# Patient Record
Sex: Female | Born: 1960
Health system: Southern US, Community
[De-identification: ages and names within clinical notes are randomized; demographics above are authoritative.]

## PROBLEM LIST (undated history)

## (undated) DIAGNOSIS — R51 Headache: Secondary | ICD-10-CM

## (undated) DIAGNOSIS — I10 Essential (primary) hypertension: Secondary | ICD-10-CM

## (undated) DIAGNOSIS — R519 Headache, unspecified: Secondary | ICD-10-CM

## (undated) DIAGNOSIS — M1990 Unspecified osteoarthritis, unspecified site: Secondary | ICD-10-CM

## (undated) DIAGNOSIS — M5030 Other cervical disc degeneration, unspecified cervical region: Secondary | ICD-10-CM

## (undated) DIAGNOSIS — K219 Gastro-esophageal reflux disease without esophagitis: Secondary | ICD-10-CM

## (undated) DIAGNOSIS — D649 Anemia, unspecified: Secondary | ICD-10-CM

## (undated) DIAGNOSIS — R161 Splenomegaly, not elsewhere classified: Secondary | ICD-10-CM

## (undated) DIAGNOSIS — K921 Melena: Secondary | ICD-10-CM

## (undated) DIAGNOSIS — H409 Unspecified glaucoma: Secondary | ICD-10-CM

## (undated) DIAGNOSIS — R634 Abnormal weight loss: Secondary | ICD-10-CM

## (undated) DIAGNOSIS — G4730 Sleep apnea, unspecified: Secondary | ICD-10-CM

## (undated) DIAGNOSIS — D689 Coagulation defect, unspecified: Secondary | ICD-10-CM

## (undated) DIAGNOSIS — M7989 Other specified soft tissue disorders: Secondary | ICD-10-CM

## (undated) DIAGNOSIS — T7840XA Allergy, unspecified, initial encounter: Secondary | ICD-10-CM

## (undated) DIAGNOSIS — R569 Unspecified convulsions: Secondary | ICD-10-CM

## (undated) DIAGNOSIS — F419 Anxiety disorder, unspecified: Secondary | ICD-10-CM

## (undated) DIAGNOSIS — R233 Spontaneous ecchymoses: Secondary | ICD-10-CM

## (undated) DIAGNOSIS — G709 Myoneural disorder, unspecified: Secondary | ICD-10-CM

## (undated) DIAGNOSIS — R112 Nausea with vomiting, unspecified: Secondary | ICD-10-CM

## (undated) DIAGNOSIS — K649 Unspecified hemorrhoids: Secondary | ICD-10-CM

## (undated) DIAGNOSIS — E785 Hyperlipidemia, unspecified: Secondary | ICD-10-CM

## (undated) DIAGNOSIS — D693 Immune thrombocytopenic purpura: Secondary | ICD-10-CM

## (undated) DIAGNOSIS — K769 Liver disease, unspecified: Secondary | ICD-10-CM

## (undated) DIAGNOSIS — F410 Panic disorder [episodic paroxysmal anxiety] without agoraphobia: Secondary | ICD-10-CM

## (undated) DIAGNOSIS — I517 Cardiomegaly: Secondary | ICD-10-CM

## (undated) DIAGNOSIS — N6019 Diffuse cystic mastopathy of unspecified breast: Secondary | ICD-10-CM

## (undated) DIAGNOSIS — R1310 Dysphagia, unspecified: Secondary | ICD-10-CM

## (undated) DIAGNOSIS — R531 Weakness: Secondary | ICD-10-CM

## (undated) DIAGNOSIS — K631 Perforation of intestine (nontraumatic): Secondary | ICD-10-CM

## (undated) DIAGNOSIS — R0981 Nasal congestion: Secondary | ICD-10-CM

## (undated) DIAGNOSIS — K625 Hemorrhage of anus and rectum: Secondary | ICD-10-CM

## (undated) DIAGNOSIS — F329 Major depressive disorder, single episode, unspecified: Secondary | ICD-10-CM

## (undated) DIAGNOSIS — Z5189 Encounter for other specified aftercare: Secondary | ICD-10-CM

## (undated) DIAGNOSIS — R238 Other skin changes: Secondary | ICD-10-CM

## (undated) DIAGNOSIS — F32A Depression, unspecified: Secondary | ICD-10-CM

## (undated) HISTORY — PX: SPINE SURGERY: SHX786

## (undated) HISTORY — DX: Hyperlipidemia, unspecified: E78.5

## (undated) HISTORY — DX: Nausea with vomiting, unspecified: R11.2

## (undated) HISTORY — DX: Spontaneous ecchymoses: R23.3

## (undated) HISTORY — DX: Immune thrombocytopenic purpura: D69.3

## (undated) HISTORY — DX: Unspecified osteoarthritis, unspecified site: M19.90

## (undated) HISTORY — DX: Hemorrhage of anus and rectum: K62.5

## (undated) HISTORY — DX: Unspecified glaucoma: H40.9

## (undated) HISTORY — DX: Other specified soft tissue disorders: M79.89

## (undated) HISTORY — DX: Encounter for other specified aftercare: Z51.89

## (undated) HISTORY — DX: Gastro-esophageal reflux disease without esophagitis: K21.9

## (undated) HISTORY — DX: Headache, unspecified: R51.9

## (undated) HISTORY — DX: Allergy, unspecified, initial encounter: T78.40XA

## (undated) HISTORY — DX: Other skin changes: R23.8

## (undated) HISTORY — PX: COLON SURGERY: SHX602

## (undated) HISTORY — DX: Headache: R51

## (undated) HISTORY — DX: Anemia, unspecified: D64.9

## (undated) HISTORY — DX: Liver disease, unspecified: K76.9

## (undated) HISTORY — DX: Weakness: R53.1

## (undated) HISTORY — DX: Dysphagia, unspecified: R13.10

## (undated) HISTORY — DX: Coagulation defect, unspecified: D68.9

## (undated) HISTORY — DX: Melena: K92.1

## (undated) HISTORY — DX: Essential (primary) hypertension: I10

## (undated) HISTORY — DX: Myoneural disorder, unspecified: G70.9

## (undated) HISTORY — DX: Abnormal weight loss: R63.4

## (undated) HISTORY — PX: BACK SURGERY: SHX140

## (undated) HISTORY — PX: EYE SURGERY: SHX253

## (undated) HISTORY — DX: Diffuse cystic mastopathy of unspecified breast: N60.19

## (undated) HISTORY — DX: Cardiomegaly: I51.7

## (undated) HISTORY — PX: HERNIA REPAIR: SHX51

## (undated) HISTORY — PX: CERVICAL SPINE SURGERY: SHX589

## (undated) HISTORY — DX: Nasal congestion: R09.81

## (undated) HISTORY — DX: Anxiety disorder, unspecified: F41.9

---

## 1995-11-10 HISTORY — PX: ABDOMINAL HYSTERECTOMY: SHX81

## 1996-11-09 DIAGNOSIS — R569 Unspecified convulsions: Secondary | ICD-10-CM

## 1996-11-09 HISTORY — DX: Unspecified convulsions: R56.9

## 1998-06-25 ENCOUNTER — Other Ambulatory Visit: Admission: RE | Admit: 1998-06-25 | Discharge: 1998-06-25 | Payer: Self-pay | Admitting: Obstetrics & Gynecology

## 1998-06-25 ENCOUNTER — Other Ambulatory Visit: Admission: RE | Admit: 1998-06-25 | Discharge: 1998-06-25 | Payer: Self-pay | Admitting: Cardiovascular Disease

## 1998-11-29 ENCOUNTER — Ambulatory Visit: Admission: RE | Admit: 1998-11-29 | Discharge: 1998-11-29 | Payer: Self-pay | Admitting: Pulmonary Disease

## 1999-07-02 ENCOUNTER — Other Ambulatory Visit: Admission: RE | Admit: 1999-07-02 | Discharge: 1999-07-02 | Payer: Self-pay | Admitting: Obstetrics & Gynecology

## 1999-07-09 ENCOUNTER — Ambulatory Visit (HOSPITAL_COMMUNITY): Admission: RE | Admit: 1999-07-09 | Discharge: 1999-07-09 | Payer: Self-pay | Admitting: Obstetrics & Gynecology

## 1999-07-09 ENCOUNTER — Encounter (INDEPENDENT_AMBULATORY_CARE_PROVIDER_SITE_OTHER): Payer: Self-pay

## 1999-09-12 ENCOUNTER — Encounter: Payer: Self-pay | Admitting: Obstetrics & Gynecology

## 1999-09-17 ENCOUNTER — Inpatient Hospital Stay (HOSPITAL_COMMUNITY): Admission: RE | Admit: 1999-09-17 | Discharge: 1999-09-19 | Payer: Self-pay | Admitting: Obstetrics & Gynecology

## 1999-09-17 ENCOUNTER — Encounter (INDEPENDENT_AMBULATORY_CARE_PROVIDER_SITE_OTHER): Payer: Self-pay | Admitting: Specialist

## 1999-09-22 ENCOUNTER — Inpatient Hospital Stay (HOSPITAL_COMMUNITY): Admission: AD | Admit: 1999-09-22 | Discharge: 1999-09-26 | Payer: Self-pay | Admitting: Family Medicine

## 1999-09-24 ENCOUNTER — Encounter: Payer: Self-pay | Admitting: Obstetrics & Gynecology

## 1999-11-05 ENCOUNTER — Encounter: Payer: Self-pay | Admitting: Nephrology

## 1999-11-05 ENCOUNTER — Encounter: Admission: RE | Admit: 1999-11-05 | Discharge: 1999-11-05 | Payer: Self-pay | Admitting: Critical Care Medicine

## 2001-11-25 ENCOUNTER — Encounter: Admission: RE | Admit: 2001-11-25 | Discharge: 2001-11-25 | Payer: Self-pay | Admitting: *Deleted

## 2002-02-05 ENCOUNTER — Emergency Department (HOSPITAL_COMMUNITY): Admission: EM | Admit: 2002-02-05 | Discharge: 2002-02-05 | Payer: Self-pay

## 2002-02-07 ENCOUNTER — Encounter: Admission: RE | Admit: 2002-02-07 | Discharge: 2002-02-07 | Payer: Self-pay | Admitting: Internal Medicine

## 2002-03-02 ENCOUNTER — Encounter: Payer: Self-pay | Admitting: Internal Medicine

## 2002-03-02 ENCOUNTER — Encounter: Admission: RE | Admit: 2002-03-02 | Discharge: 2002-03-02 | Payer: Self-pay | Admitting: Internal Medicine

## 2002-03-02 ENCOUNTER — Ambulatory Visit (HOSPITAL_COMMUNITY): Admission: RE | Admit: 2002-03-02 | Discharge: 2002-03-02 | Payer: Self-pay | Admitting: Internal Medicine

## 2002-03-20 ENCOUNTER — Encounter: Admission: RE | Admit: 2002-03-20 | Discharge: 2002-03-20 | Payer: Self-pay | Admitting: Internal Medicine

## 2002-06-09 ENCOUNTER — Encounter: Admission: RE | Admit: 2002-06-09 | Discharge: 2002-06-09 | Payer: Self-pay | Admitting: Internal Medicine

## 2002-08-07 ENCOUNTER — Emergency Department (HOSPITAL_COMMUNITY): Admission: EM | Admit: 2002-08-07 | Discharge: 2002-08-08 | Payer: Self-pay | Admitting: Emergency Medicine

## 2002-08-08 ENCOUNTER — Encounter: Payer: Self-pay | Admitting: Emergency Medicine

## 2002-11-23 ENCOUNTER — Encounter: Admission: RE | Admit: 2002-11-23 | Discharge: 2002-11-23 | Payer: Self-pay | Admitting: Internal Medicine

## 2003-03-27 ENCOUNTER — Encounter: Admission: RE | Admit: 2003-03-27 | Discharge: 2003-03-27 | Payer: Self-pay | Admitting: Internal Medicine

## 2003-06-26 ENCOUNTER — Encounter: Admission: RE | Admit: 2003-06-26 | Discharge: 2003-06-26 | Payer: Self-pay | Admitting: Internal Medicine

## 2003-07-13 ENCOUNTER — Ambulatory Visit (HOSPITAL_COMMUNITY): Admission: RE | Admit: 2003-07-13 | Discharge: 2003-07-13 | Payer: Self-pay | Admitting: Family Medicine

## 2003-10-09 ENCOUNTER — Encounter: Admission: RE | Admit: 2003-10-09 | Discharge: 2003-10-09 | Payer: Self-pay | Admitting: Internal Medicine

## 2004-01-09 ENCOUNTER — Encounter: Admission: RE | Admit: 2004-01-09 | Discharge: 2004-01-09 | Payer: Self-pay | Admitting: Internal Medicine

## 2004-03-12 ENCOUNTER — Emergency Department (HOSPITAL_COMMUNITY): Admission: EM | Admit: 2004-03-12 | Discharge: 2004-03-12 | Payer: Self-pay | Admitting: Family Medicine

## 2004-05-07 ENCOUNTER — Inpatient Hospital Stay (HOSPITAL_COMMUNITY): Admission: RE | Admit: 2004-05-07 | Discharge: 2004-05-08 | Payer: Self-pay | Admitting: Orthopaedic Surgery

## 2005-01-19 ENCOUNTER — Ambulatory Visit: Payer: Self-pay | Admitting: Internal Medicine

## 2005-02-19 ENCOUNTER — Ambulatory Visit: Payer: Self-pay | Admitting: Internal Medicine

## 2005-03-04 ENCOUNTER — Ambulatory Visit: Payer: Self-pay | Admitting: Internal Medicine

## 2005-03-04 ENCOUNTER — Ambulatory Visit (HOSPITAL_COMMUNITY): Admission: RE | Admit: 2005-03-04 | Discharge: 2005-03-04 | Payer: Self-pay | Admitting: Internal Medicine

## 2005-09-03 ENCOUNTER — Ambulatory Visit: Payer: Self-pay | Admitting: Hospitalist

## 2005-09-11 ENCOUNTER — Ambulatory Visit: Payer: Self-pay | Admitting: Internal Medicine

## 2005-12-04 ENCOUNTER — Ambulatory Visit (HOSPITAL_COMMUNITY): Admission: RE | Admit: 2005-12-04 | Discharge: 2005-12-04 | Payer: Self-pay | Admitting: Orthopaedic Surgery

## 2006-02-04 ENCOUNTER — Ambulatory Visit (HOSPITAL_COMMUNITY): Admission: RE | Admit: 2006-02-04 | Discharge: 2006-02-06 | Payer: Self-pay | Admitting: Orthopaedic Surgery

## 2006-02-18 ENCOUNTER — Ambulatory Visit: Payer: Self-pay | Admitting: Hospitalist

## 2006-10-07 ENCOUNTER — Ambulatory Visit: Payer: Self-pay | Admitting: Hospitalist

## 2006-10-12 ENCOUNTER — Ambulatory Visit (HOSPITAL_COMMUNITY): Admission: RE | Admit: 2006-10-12 | Discharge: 2006-10-12 | Payer: Self-pay | Admitting: Sports Medicine

## 2006-12-22 ENCOUNTER — Telehealth (INDEPENDENT_AMBULATORY_CARE_PROVIDER_SITE_OTHER): Payer: Self-pay | Admitting: *Deleted

## 2007-01-24 ENCOUNTER — Telehealth: Payer: Self-pay | Admitting: *Deleted

## 2007-01-25 ENCOUNTER — Ambulatory Visit: Payer: Self-pay | Admitting: Hospitalist

## 2007-01-25 ENCOUNTER — Ambulatory Visit (HOSPITAL_COMMUNITY): Admission: RE | Admit: 2007-01-25 | Discharge: 2007-01-25 | Payer: Self-pay | Admitting: Hospitalist

## 2007-01-25 DIAGNOSIS — F17200 Nicotine dependence, unspecified, uncomplicated: Secondary | ICD-10-CM

## 2007-01-25 DIAGNOSIS — F329 Major depressive disorder, single episode, unspecified: Secondary | ICD-10-CM

## 2007-01-25 DIAGNOSIS — R51 Headache: Secondary | ICD-10-CM | POA: Insufficient documentation

## 2007-01-25 DIAGNOSIS — F5104 Psychophysiologic insomnia: Secondary | ICD-10-CM | POA: Insufficient documentation

## 2007-01-25 DIAGNOSIS — R609 Edema, unspecified: Secondary | ICD-10-CM | POA: Insufficient documentation

## 2007-01-25 DIAGNOSIS — M5030 Other cervical disc degeneration, unspecified cervical region: Secondary | ICD-10-CM

## 2007-01-25 DIAGNOSIS — M545 Low back pain, unspecified: Secondary | ICD-10-CM | POA: Insufficient documentation

## 2007-01-25 DIAGNOSIS — R519 Headache, unspecified: Secondary | ICD-10-CM | POA: Insufficient documentation

## 2007-01-25 DIAGNOSIS — N63 Unspecified lump in unspecified breast: Secondary | ICD-10-CM | POA: Insufficient documentation

## 2007-01-25 DIAGNOSIS — G4700 Insomnia, unspecified: Secondary | ICD-10-CM

## 2007-01-25 DIAGNOSIS — Z9079 Acquired absence of other genital organ(s): Secondary | ICD-10-CM | POA: Insufficient documentation

## 2007-01-25 DIAGNOSIS — I10 Essential (primary) hypertension: Secondary | ICD-10-CM | POA: Insufficient documentation

## 2007-01-25 DIAGNOSIS — R569 Unspecified convulsions: Secondary | ICD-10-CM

## 2007-01-25 DIAGNOSIS — G40909 Epilepsy, unspecified, not intractable, without status epilepticus: Secondary | ICD-10-CM | POA: Insufficient documentation

## 2007-01-25 DIAGNOSIS — F411 Generalized anxiety disorder: Secondary | ICD-10-CM | POA: Insufficient documentation

## 2007-01-25 HISTORY — DX: Edema, unspecified: R60.9

## 2007-01-25 LAB — CONVERTED CEMR LAB
AST: 16 units/L (ref 0–37)
Albumin: 4.4 g/dL (ref 3.5–5.2)
Alkaline Phosphatase: 99 units/L (ref 39–117)
BUN: 25 mg/dL — ABNORMAL HIGH (ref 6–23)
CO2: 27 meq/L (ref 19–32)
Calcium: 9.8 mg/dL (ref 8.4–10.5)
Cholesterol: 199 mg/dL (ref 0–200)
Glucose, Bld: 109 mg/dL — ABNORMAL HIGH (ref 70–99)
HDL: 48 mg/dL (ref >39)
LDL Cholesterol: 119 mg/dL — ABNORMAL HIGH (ref 0–99)
Potassium: 4.3 meq/L (ref 3.5–5.3)
Total Bilirubin: 0.3 mg/dL (ref 0.3–1.2)
Total CHOL/HDL Ratio: 4.1
Triglycerides: 161 mg/dL — ABNORMAL HIGH (ref <150)

## 2007-05-12 ENCOUNTER — Telehealth (INDEPENDENT_AMBULATORY_CARE_PROVIDER_SITE_OTHER): Payer: Self-pay | Admitting: *Deleted

## 2007-05-26 ENCOUNTER — Telehealth: Payer: Self-pay | Admitting: *Deleted

## 2007-07-01 ENCOUNTER — Telehealth (INDEPENDENT_AMBULATORY_CARE_PROVIDER_SITE_OTHER): Payer: Self-pay | Admitting: Hospitalist

## 2007-07-25 ENCOUNTER — Telehealth: Payer: Self-pay | Admitting: *Deleted

## 2007-10-20 ENCOUNTER — Telehealth: Payer: Self-pay | Admitting: *Deleted

## 2007-10-24 ENCOUNTER — Ambulatory Visit: Payer: Self-pay | Admitting: Internal Medicine

## 2007-10-24 ENCOUNTER — Encounter (INDEPENDENT_AMBULATORY_CARE_PROVIDER_SITE_OTHER): Payer: Self-pay | Admitting: Infectious Diseases

## 2007-10-24 DIAGNOSIS — J019 Acute sinusitis, unspecified: Secondary | ICD-10-CM

## 2007-10-24 LAB — CONVERTED CEMR LAB
Albumin: 4.2 g/dL (ref 3.5–5.2)
CO2: 29 meq/L (ref 19–32)
Calcium: 9.7 mg/dL (ref 8.4–10.5)
Chloride: 104 meq/L (ref 96–112)
Creatinine, Ser: 0.78 mg/dL (ref 0.40–1.20)
Glucose, Bld: 91 mg/dL (ref 70–99)
Sodium: 142 meq/L (ref 135–145)
Total Bilirubin: 0.3 mg/dL (ref 0.3–1.2)

## 2007-11-22 ENCOUNTER — Telehealth: Payer: Self-pay | Admitting: *Deleted

## 2007-11-23 ENCOUNTER — Telehealth (INDEPENDENT_AMBULATORY_CARE_PROVIDER_SITE_OTHER): Payer: Self-pay | Admitting: Hospitalist

## 2007-12-20 ENCOUNTER — Ambulatory Visit: Payer: Self-pay | Admitting: Hospitalist

## 2007-12-20 DIAGNOSIS — R55 Syncope and collapse: Secondary | ICD-10-CM

## 2007-12-20 HISTORY — DX: Syncope and collapse: R55

## 2007-12-20 LAB — CONVERTED CEMR LAB
ALT: 11 units/L (ref 0–35)
AST: 15 units/L (ref 0–37)
Albumin: 4.6 g/dL (ref 3.5–5.2)
Alkaline Phosphatase: 96 units/L (ref 39–117)
Anti Nuclear Antibody(ANA): NEGATIVE
BUN: 18 mg/dL (ref 6–23)
Basophils Absolute: 0 10*3/uL (ref 0.0–0.1)
Basophils Relative: 0 % (ref 0–1)
CO2: 28 meq/L (ref 19–32)
Calcium: 10.1 mg/dL (ref 8.4–10.5)
Chloride: 99 meq/L (ref 96–112)
Creatinine, Ser: 0.87 mg/dL (ref 0.40–1.20)
Eosinophils Absolute: 0.6 10*3/uL (ref 0.0–0.7)
Eosinophils Relative: 6 % — ABNORMAL HIGH (ref 0–5)
Glucose, Bld: 101 mg/dL — ABNORMAL HIGH (ref 70–99)
HCT: 46.5 % — ABNORMAL HIGH (ref 36.0–46.0)
Hemoglobin: 15.5 g/dL — ABNORMAL HIGH (ref 12.0–15.0)
Lymphocytes Relative: 41 % (ref 12–46)
Lymphs Abs: 3.8 10*3/uL (ref 0.7–4.0)
MCHC: 33.3 g/dL (ref 30.0–36.0)
MCV: 92.3 fL (ref 78.0–100.0)
Monocytes Absolute: 0.8 10*3/uL (ref 0.1–1.0)
Monocytes Relative: 8 % (ref 3–12)
Neutro Abs: 4 10*3/uL (ref 1.7–7.7)
Neutrophils Relative %: 44 % (ref 43–77)
Platelets: 338 10*3/uL (ref 150–400)
Potassium: 4.1 meq/L (ref 3.5–5.3)
RBC: 5.04 M/uL (ref 3.87–5.11)
RDW: 12.7 % (ref 11.5–15.5)
Sed Rate: 6 mm/hr (ref 0–22)
Sodium: 141 meq/L (ref 135–145)
TSH: 0.557 microintl units/mL (ref 0.350–5.50)
Total Bilirubin: 0.4 mg/dL (ref 0.3–1.2)
Total Protein: 7.5 g/dL (ref 6.0–8.3)
Vitamin B-12: 386 pg/mL (ref 211–911)
WBC: 9.1 10*3/uL (ref 4.0–10.5)

## 2007-12-27 ENCOUNTER — Ambulatory Visit (HOSPITAL_COMMUNITY): Admission: RE | Admit: 2007-12-27 | Discharge: 2007-12-27 | Payer: Self-pay | Admitting: Hospitalist

## 2007-12-29 ENCOUNTER — Telehealth (INDEPENDENT_AMBULATORY_CARE_PROVIDER_SITE_OTHER): Payer: Self-pay | Admitting: *Deleted

## 2008-01-26 ENCOUNTER — Ambulatory Visit: Payer: Self-pay | Admitting: Hospitalist

## 2008-01-26 ENCOUNTER — Telehealth (INDEPENDENT_AMBULATORY_CARE_PROVIDER_SITE_OTHER): Payer: Self-pay | Admitting: Hospitalist

## 2008-01-26 DIAGNOSIS — R05 Cough: Secondary | ICD-10-CM

## 2008-03-02 ENCOUNTER — Telehealth (INDEPENDENT_AMBULATORY_CARE_PROVIDER_SITE_OTHER): Payer: Self-pay | Admitting: Hospitalist

## 2008-03-06 ENCOUNTER — Telehealth (INDEPENDENT_AMBULATORY_CARE_PROVIDER_SITE_OTHER): Payer: Self-pay | Admitting: Pharmacy Technician

## 2008-04-09 ENCOUNTER — Telehealth (INDEPENDENT_AMBULATORY_CARE_PROVIDER_SITE_OTHER): Payer: Self-pay | Admitting: Hospitalist

## 2008-06-19 ENCOUNTER — Telehealth: Payer: Self-pay | Admitting: *Deleted

## 2008-07-03 ENCOUNTER — Telehealth: Payer: Self-pay | Admitting: *Deleted

## 2008-07-11 ENCOUNTER — Telehealth: Payer: Self-pay | Admitting: Internal Medicine

## 2008-07-18 ENCOUNTER — Telehealth: Payer: Self-pay | Admitting: *Deleted

## 2008-07-18 ENCOUNTER — Encounter: Payer: Self-pay | Admitting: Internal Medicine

## 2008-07-20 ENCOUNTER — Telehealth: Payer: Self-pay | Admitting: *Deleted

## 2008-07-25 ENCOUNTER — Ambulatory Visit: Payer: Self-pay | Admitting: Internal Medicine

## 2008-07-25 LAB — CONVERTED CEMR LAB
BUN: 15 mg/dL (ref 6–23)
Calcium: 9.9 mg/dL (ref 8.4–10.5)
Creatinine, Ser: 0.85 mg/dL (ref 0.40–1.20)
Potassium: 4.2 meq/L (ref 3.5–5.3)

## 2008-08-08 ENCOUNTER — Telehealth: Payer: Self-pay | Admitting: Internal Medicine

## 2008-08-21 ENCOUNTER — Telehealth: Payer: Self-pay | Admitting: *Deleted

## 2008-09-03 ENCOUNTER — Telehealth: Payer: Self-pay | Admitting: *Deleted

## 2008-10-09 ENCOUNTER — Telehealth: Payer: Self-pay | Admitting: *Deleted

## 2008-10-19 ENCOUNTER — Telehealth: Payer: Self-pay | Admitting: Internal Medicine

## 2008-11-13 ENCOUNTER — Telehealth: Payer: Self-pay | Admitting: *Deleted

## 2008-11-15 ENCOUNTER — Telehealth: Payer: Self-pay | Admitting: *Deleted

## 2009-08-08 ENCOUNTER — Encounter: Admission: RE | Admit: 2009-08-08 | Discharge: 2009-08-08 | Payer: Self-pay

## 2010-01-21 ENCOUNTER — Encounter: Payer: Self-pay | Admitting: Internal Medicine

## 2010-03-03 ENCOUNTER — Encounter (INDEPENDENT_AMBULATORY_CARE_PROVIDER_SITE_OTHER): Payer: Self-pay | Admitting: *Deleted

## 2010-07-01 ENCOUNTER — Encounter: Admission: RE | Admit: 2010-07-01 | Discharge: 2010-07-01 | Payer: Self-pay | Admitting: Orthopaedic Surgery

## 2010-11-30 ENCOUNTER — Encounter: Payer: Self-pay | Admitting: Internal Medicine

## 2010-12-11 NOTE — Miscellaneous (Signed)
Summary: [facility]  [facility]   Imported By: Florinda Marker 01/21/2010 14:25:18  _____________________________________________________________________  External Attachment:    Type:   Image     Comment:   External Document

## 2010-12-11 NOTE — Letter (Signed)
Summary: [facility] RECALL LETTER  All     ,     Phone:   Fax:     03/03/2010   [facility] A LUNSFORD [address] [place], [place]  [address]  [id number]   Dear  Ms. Tarshia LUNSFORD,   You are due to follow-up with a doctor at the [facility].  We have been unable to contact you by phone.  If you would like to schedule a visit, please call [phone].  If you are receiving your health care somewhere else, please call us and we will take your name off our patient list.  Healthy regards,  Raynaldo Opitz, Director [facility]

## 2011-01-06 ENCOUNTER — Other Ambulatory Visit: Payer: Self-pay | Admitting: Orthopaedic Surgery

## 2011-01-06 DIAGNOSIS — M542 Cervicalgia: Secondary | ICD-10-CM

## 2011-01-08 ENCOUNTER — Other Ambulatory Visit: Payer: Self-pay

## 2011-01-23 ENCOUNTER — Other Ambulatory Visit: Payer: Self-pay

## 2011-01-23 ENCOUNTER — Inpatient Hospital Stay (HOSPITAL_COMMUNITY): Admission: RE | Admit: 2011-01-23 | Payer: Self-pay | Source: Ambulatory Visit | Admitting: Orthopedic Surgery

## 2011-01-23 ENCOUNTER — Ambulatory Visit
Admission: RE | Admit: 2011-01-23 | Discharge: 2011-01-23 | Disposition: A | Discharge status: Home / Self Care | Payer: Medicare Other | Source: Ambulatory Visit | Attending: Orthopaedic Surgery | Admitting: Orthopaedic Surgery

## 2011-01-23 DIAGNOSIS — M542 Cervicalgia: Secondary | ICD-10-CM

## 2011-03-27 NOTE — Op Note (Signed)
NAME:  [name], SAKUMA NO.:  [id number]   MEDICAL RECORD NO.:  [id number]          PATIENT TYPE:  OIB   LOCATION:  3004                         FACILITY:  [facility]   PHYSICIAN:  Sharolyn Douglas, M.D.        DATE OF BIRTH:  17-Aug-1961   DATE OF PROCEDURE:  02/04/2006  DATE OF DISCHARGE:                                 OPERATIVE REPORT   DIAGNOSES:  1.  Cervical spondylotic radiculopathy C6-7.  2.  Previous anterior cervical decompression and fusion, C4-5 and C5-6 with      plate.  3.  Chronic neck and bilateral upper extremity pain.   PROCEDURES:  1.  Removal of anterior cervical plate from C4 to C6.  2.  Anterior cervical diskectomy C6-7 with decompression of the thecal sac      and foramen bilaterally.  3.  Anterior cervical fusion C6-7 with placement of allograft prosthesis      spacer, packed with local autogenous bone graft.  4.  The anterior cervical plating C6-8 using the Abbott spine system.   SURGEON:  Sharolyn Douglas, M.D.   ASSISTANT:  Verlin Fester, P.A.   ANESTHESIA:  General endotracheal.   ESTIMATED BLOOD LOSS:  Minimal.   COMPLICATIONS:  None.   INDICATIONS:  The patient is a 50 year old female with progressive neck and  bilateral upper extremity pain.  She has a previous history of anterior  cervical fusion from C4-C6 with plate.  She did well initially but has  redeveloped symptomatology.  She had a selective nerve root injection at C7,  and this provided temporary relief.  A MRI scan showed stenosis below the  fusion with biforaminal narrowing.  Because of her persistent symptoms, she  has elected to undergo removal of the previous plate from J4-N8, anterior  cervical fusion C6-7 with allograft and plate.  The risk and benefits were  reviewed and she elected to proceed.   PROCEDURE:  The patient was identified in the holding area, taken to the  operating room.  She underwent general endotracheal anesthesia without  difficulty; given prophylactic  IV antibiotics.  She was carefully positioned  on the operating room table with the Mayfield head rest.  Her neck was  placed in slight extension.  The neck was prepped and draped in usual  sterile fashion.  The previous left-sided transverse incision was utilized.  Dissection was carried through the previous scar tissue.  The interval  between the SCM and strap muscles medially was developed down to the  prevertebral space.  There was an extensive amount of scarring noted, and  the carotid sheath, esophagus and trachea were identified and protected at  all times.  The previous plate was identified and carefully cleared of scar  tissue.  Using the appropriate screwdriver, screws were backed out and the  plate was removed.  Bone wax was placed into the screw holes.  We then  evaluated the C6-7 disk; there was a large anterior osteophyte.  This was  removed a Gaffer.  Deep retractors were placed.  Caspar distraction  pins were placed at C6-C7 vertebral  bodies.  Gentle distraction applied.  The microscope was draped and brought into the field.  We then completed a  radical diskectomy back to a posterior longitudinal ligament.  The disk  space itself was degenerative and narrowed.  A high-speed bur was used to  take down the intervertebral joints.  A 2 mm Kerrison punch was used to  remove the posterior longitudinal ligament, undercut the posterior vertebral  margins, and complete foraminotomies.  We then placed a 7 mm allograft  prosthesis spacer, which had been packed with local bone graft from the  drill shavings.  This was carefully countersunk 1 mm into the interspace.  We then placed a 24-mm anterior cervical plate from Z6-X0, with four 12 mm  locking screws.  We ensured that the locking mechanism was engaged.  The  wound was irrigated.  The esophagus, trachea and carotid sheath were  evaluated.  There were no apparent injuries.  Hemostasis was achieved.  A  deep Penrose drain  was left in place.  Intraoperative x-ray was taken to  confirm levels, showing appropriate positioning of the instrumentation.  It  was difficult to visualize the C7 level due to the patient's body habitus.  The wound was then closed in layers using 2-0 Vicryl on the platysma, 3-0  Vicryl on the subcutaneous layer, and then 4-0 Vicryl suture to approximate  the skin edges.  Benzoin and Steri-Strips were placed.  Sterile dressing was  applied.  The patient was extubated without difficulty; transferred to the  recovery in stable condition, able to move her upper and lower extremities.  Needle and sponge count were correct.      Sharolyn Douglas, M.D.  Electronically Signed     MC/MEDQ  D:  02/04/2006  T:  02/05/2006  Job:  [id number]

## 2011-03-27 NOTE — Op Note (Signed)
[name], [name] NO.:  [id number]   MEDICAL RECORD NO.:  [id number]                   PATIENT TYPE:  INP   LOCATION:  2899                                 FACILITY:  [facility]   PHYSICIAN:  Sharolyn Douglas, M.D.                     DATE OF BIRTH:  1960-12-01   DATE OF PROCEDURE:  05/07/2004  DATE OF DISCHARGE:                                 OPERATIVE REPORT   PREOPERATIVE DIAGNOSIS:  Cervical spondylitic radiculopathy C4-5 and C5-6.   PROCEDURE:  1. Anterior cervical diskectomy C4-5 and C5-6 with decompression of the     nerve root bilaterally.  2. Anterior cervical arthrodesis C4-5 and C5-6 with placement of two     allograft prosthesis spacers packed with local autogenous bone graft.  3. Anterior cervical plating C4 through C6 utilizing the spinal concept     system.   SURGEON:  Sharolyn Douglas, M.D.   ASSISTANT:  Verlin Fester, P.A.   ANESTHESIA:  General endotracheal.   COMPLICATIONS:  None.   INDICATIONS FOR PROCEDURE:  The patient is a very pleasant 50 year old  female with persistent right upper extremity pain felt to be secondary to  foraminal narrowing and HNP at C4-5 and C5-6.  She has been refractory to  all conservative care, elected to undergo ACTF at C4-5 and C5-6 in hopes of  improving her symptoms.  Risks, benefits, and alternatives were extensively  reviewed.   DESCRIPTION OF PROCEDURE:  The patient was preoperatively identified in the  holding area and taken to the operating room.  She underwent general  endotracheal anesthesia without difficulty.  She was given prophylactic IV  antibiotics.  She was carefully positioned supine on the operating table  with the Mayfield head rest.  Her neck was placed in slight extension.  Neck  was prepped and draped in the usual sterile fashion.  5 pounds of Halter  traction applied.  4 cm incision made left side of the neck in a transverse  fashion.  Dissection was carried sharply through the  platysma.  The interval  between the SCM and strap muscles medially was developed down to the  prevertebral space.  The C5-6 level was easily identified by the anterior  osteophyte.  Spinal needle placed.  X-ray taken to confirm this location.  The esophagus, trachea, carotid sheath were identified and protected at all  times.  Longus coli muscle was elevated out over the C4-5 and C5-6 disk  spaces bilaterally.  Deep shadowline retractor was placed.  Caspar  distraction pins placed in the C4, C5, and C6 vertebral bodies.  The  microscope was draped and brought into the field.  Diskectomies were carried  back at C4-5 and C5-6 to the posterior longitudinal ligament.  We found the  disk space to be very degenerative at bone levels with narrowing and  osteophyte formation.  High speed bur used to  remove the cartilaginous  endplates and take down the uncovertebral spurs on the right side.  We  completed wide foraminotomies with the 2 mm Kerrison bilaterally at both  levels.  Bleeding was controlled with Flow CO.  We then placed two 6 mm  Nuvasive allograft prosthesis spacers packed with local autogenous bone  graft collected from the bur shavings.  These were carefully countersunk 1  mm.  We then removed the Caspar distraction pins and the Halter traction.  We placed an anterior cervical plate C4 through C6 with six 12 mm locking  screws from the spinal concept system.  We had good purchase of the screws.  We ensured the locking mechanism engaged.  The wound was irrigated.  Deep  Penrose drain left in place.  The esophagus, trachea, and carotid sheath  were examined.  There were no apparent injuries.  Deep platysma layer closed  with interrupted 2-0 Vicryl.  Subcutaneous layer closed with interrupted 3-0  Vicryl followed by a running 4-0 subcuticular Vicryl suture on the skin  edges.  Benzoin and Steri-Strips placed.  Sterile dressing applied.  Soft  collar placed.  The patient was extubated  without difficulty, transferred to  the recovery room in stable condition and able to move her upper and lower  extremities.                                               Sharolyn Douglas, M.D.    MC/MEDQ  D:  05/07/2004  T:  05/07/2004  Job:  [id number]

## 2011-03-27 NOTE — H&P (Signed)
NAME:  Betty, Garza NO.:  [id number]   MEDICAL RECORD NO.:  [id number]                   PATIENT TYPE:  INP   LOCATION:  NA                                   FACILITY:  [facility]   PHYSICIAN:  [name], M.D.                     DATE OF BIRTH:  1961/01/19   DATE OF ADMISSION:  05/07/2004  DATE OF DISCHARGE:                                HISTORY & PHYSICAL   CHIEF COMPLAINT:  Pain in my neck and shoulders.   HISTORY OF PRESENT ILLNESS:  This 50 year old white female was seen by Dr.  Noel Garza for continuing progressive problems concerning pain in her cervical  spine with radiation into her shoulders.  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  She was injured while on the job about over a year and a  half ago when she was using a hand crank to lift a bed with the patient in  it, and had immediately onset of pain in the neck and upper extremities.  Unfortunately, she has continued with this problem.  Due to the rapid and  sudden onset of her pain, it was thought indeed that she had an acute  cervical problem.  She was treated by Dr. Ethelene Garza with a C5 selective nerve  root block, which only provided her with short term relief in her  discomfort.  She has continued with constant pain with it into the posterior  neck into the bilateral scapular area.  The right side is more uncomfortable  than the left.  She has intermittent paresthesias and numbness into the  fingers.  Since she has failed conservative care we felt that she would  benefit with MRI as well as an EMG.  We found that she had a disk protrusion  at C4-C5.  A spondylosis and covertebral spurring with excentric disk bulge  of the right was seen at C5-C6.  The EMG which was done to rule out  peripheral neuropathy was negative.  It was felt that the majority of her  pain and discomfort is coming from her cervical spine at the C4-C5 and C5-C6  levels.  After Dr. Noel Garza had discussed all of the  complications, side  effects of the surgery, as well as the preoperative and postoperative  course, it was decided to go ahead with surgical intervention and she is  being admitted for anterior cervical diskectomy and fusion at C4-C5, C5-C6  with allograft and plate.   PAST MEDICAL HISTORY:  This patient has a history of anxiety, and  hypertension.  She also has shortness of breath during exertion which she  attributes to her current lack of activity.  She had idiopathic  thrombocytopenia purpura as a child.  She has fibroid tumors in her breasts.   PAST SURGICAL HISTORY:  C-section in 1989, C-section in 1994, and a complete  hysterectomy.  Dr. Okey Garza  is her family physician.   CURRENT MEDICATIONS:  1. Xanax 1 mg t.i.d.  2. Wellbutrin 300 mg daily.  3. Trazodone 300 mg q.h.s.  4. HCTZ 25 mg daily.  5. Flexeril 15 mg p.r.n. muscle spasm.   ALLERGIES:  She has no medical allergies.   FAMILY HISTORY:  Positive for heart disease, hypertension, diabetes, cancer,  stroke, arthritis, bleeding disorders in the patient herself.   SOCIAL HISTORY:  The patient is married, she is a Oncologist person, has  no intake of alcohol or tobacco products, has three children and her  caregiver after surgery will be her sister, [name].  She lives in a  trailer with no stairs.   REVIEW OF SYSTEMS:  CNS:  No seizure disorder, problems with double vision,  but the patient does have radiculitis consistent with C4-5, C5-6 nerve root  distribution.  CARDIOVASCULAR:  No chest pain, no angina or orthopnea.  RESPIRATORY:  No productive cough, no hemoptysis.  GASTROINTESTINAL:  No  nausea, vomiting, melena, or bloody stools.  GENITOURINARY:  No discharge,  dysuria, hematuria.  MUSCULOSKELETAL:  Primarily in present illness.   PHYSICAL EXAMINATION:  GENERAL:  Alert and cooperative, friendly, 42-year-  old white female, is well groomed and oriented times three.  VITAL SIGNS:  Blood pressure 128/92,  pulse 80, respirations 12.  HEENT:  Normocephalic,  PERRLA, EOM intact.  Oropharynx is clear.  CHEST:  Clear to auscultation, no  rhonchi, no rales.  HEART:  Regular rate and rhythm, no murmurs are heard.  ABDOMEN:  Soft, nontender, liver and spleen not felt.  GENITALIA/RECTAL/PELVIC/BREAST:  Not done, no pertinent to present illness.  EXTREMITIES:  Upper extremities as in present illness above.   ADMITTING DIAGNOSES:  1. Cervical spondylotic radiculopathy C4-C5, C5-C6.  2. Anxiety.  3. Hypertension.  4. History of idiopathic thrombocytopenic purpura as a child.   PLAN:  The patient will undergo anterior cervical diskectomy and fusion at  C4-C5, C5-C6 with allograft and plate.  All of the complications and  perioperative procedures have been discussed with the patient by Dr. Noel Garza.      [name].                 [name], M.D.    DLU/MEDQ  D:  05/02/2004  T:  05/03/2004  Job:  [id number]   cc:   [name], M.D.  [address]  [place]  [place] [address]  Fax: [phone]

## 2011-04-13 ENCOUNTER — Other Ambulatory Visit: Payer: Self-pay | Admitting: Orthopaedic Surgery

## 2011-04-14 ENCOUNTER — Other Ambulatory Visit: Payer: Self-pay | Admitting: Orthopaedic Surgery

## 2011-04-14 DIAGNOSIS — M549 Dorsalgia, unspecified: Secondary | ICD-10-CM

## 2011-04-14 DIAGNOSIS — M542 Cervicalgia: Secondary | ICD-10-CM

## 2011-04-22 ENCOUNTER — Other Ambulatory Visit: Payer: Medicare Other

## 2011-06-19 ENCOUNTER — Ambulatory Visit
Admission: RE | Admit: 2011-06-19 | Discharge: 2011-06-19 | Disposition: A | Discharge status: Home / Self Care | Payer: Medicare Other | Source: Ambulatory Visit | Attending: Orthopaedic Surgery | Admitting: Orthopaedic Surgery

## 2011-06-19 DIAGNOSIS — M542 Cervicalgia: Secondary | ICD-10-CM

## 2011-06-19 DIAGNOSIS — M549 Dorsalgia, unspecified: Secondary | ICD-10-CM

## 2011-12-18 ENCOUNTER — Encounter: Payer: Self-pay | Admitting: Gastroenterology

## 2011-12-18 ENCOUNTER — Other Ambulatory Visit: Payer: Self-pay | Admitting: Family Medicine

## 2011-12-22 ENCOUNTER — Other Ambulatory Visit: Payer: Medicare Other

## 2011-12-22 ENCOUNTER — Ambulatory Visit
Admission: RE | Admit: 2011-12-22 | Discharge: 2011-12-22 | Disposition: A | Discharge status: Home / Self Care | Payer: Medicare Other | Source: Ambulatory Visit | Attending: Family Medicine | Admitting: Family Medicine

## 2011-12-23 ENCOUNTER — Other Ambulatory Visit: Payer: Self-pay | Admitting: Family Medicine

## 2011-12-23 DIAGNOSIS — K769 Liver disease, unspecified: Secondary | ICD-10-CM

## 2011-12-25 ENCOUNTER — Ambulatory Visit
Admission: RE | Admit: 2011-12-25 | Discharge: 2011-12-25 | Disposition: A | Discharge status: Home / Self Care | Payer: Medicare Other | Source: Ambulatory Visit | Attending: Family Medicine | Admitting: Family Medicine

## 2011-12-25 DIAGNOSIS — K769 Liver disease, unspecified: Secondary | ICD-10-CM

## 2011-12-25 MED ORDER — GADOBENATE DIMEGLUMINE 529 MG/ML IV SOLN
17.0000 mL | Freq: Once | INTRAVENOUS | Status: AC | PRN
  Administered 2011-12-25: 17 mL via INTRAVENOUS

## 2011-12-29 ENCOUNTER — Other Ambulatory Visit: Payer: Medicare Other

## 2011-12-30 ENCOUNTER — Ambulatory Visit: Payer: Medicare Other | Admitting: Gastroenterology

## 2012-01-06 ENCOUNTER — Encounter: Payer: Self-pay | Admitting: Gastroenterology

## 2012-01-06 ENCOUNTER — Telehealth: Payer: Self-pay

## 2012-01-06 ENCOUNTER — Ambulatory Visit (INDEPENDENT_AMBULATORY_CARE_PROVIDER_SITE_OTHER): Payer: Medicare Other | Admitting: Gastroenterology

## 2012-01-06 VITALS — BP 126/84 | HR 76 | Ht 64.0 in | Wt 179.4 lb

## 2012-01-06 DIAGNOSIS — K5900 Constipation, unspecified: Secondary | ICD-10-CM

## 2012-01-06 DIAGNOSIS — K219 Gastro-esophageal reflux disease without esophagitis: Secondary | ICD-10-CM

## 2012-01-06 DIAGNOSIS — K769 Liver disease, unspecified: Secondary | ICD-10-CM

## 2012-01-06 DIAGNOSIS — K7689 Other specified diseases of liver: Secondary | ICD-10-CM

## 2012-01-06 NOTE — Patient Instructions (Addendum)
One of your biggest health concerns is your smoking.  This increases your risk for most cancers and serious cardiovascular diseases such as strokes, heart attacks.  You should try your best to stop.  If you need assistance, please contact your PCP or Smoking Cessation Class at [facility] [phone]) or [facility] Quit-Line (1-800-QUIT-NOW).  Smoking is particularly bad for GERD, cutting back can make a difference in GI symptoms. Referral to Dr. Almond Lint to discuss liver lesion (does it need biopsy, does it need to be removed?)  [facility] will call you with the appointment. You will be set up for a colonoscopy for chronic constipation, minor rectal bleeding with propofol. You will be set up for an upper endoscopy for chronic GERD, dysphagia with propofol. You should change the way you are taking your antiacid medicine (protonix) so that you are taking it 20-30 minutes prior to a decent meal as that is the way the pill is designed to work most effectively. Please start taking citrucel (orange flavored) powder fiber supplement.  This may cause some bloating at first but that usually goes away. Begin with a small spoonful and work your way up to a large, heaping spoonful daily over a week.  The narcotic pain meds cause nausea/constipation, try to cut back as best that you can.     We will call you with a date and time for your procedures.  If you do not here from [place] in 1 week please call [phone] and ask for Patty.

## 2012-01-06 NOTE — Progress Notes (Signed)
HPI: This is a  very pleasant 50-year-old woman whom I am meeting for the first time today.  She had US for abdominal pains.  Found liver lesion, followed up with MRI.  See those reports below.  She has nausea, vomiting in AM. Poor appetite.  SHe tried PPI (protonix) and that helped somewhat, but not completely.  She takes this before a meal by 3-4 hours.  She wakes with nausea, and often vomits.  She drinks 1-2 cups coffee in AM.  NO fast food.  No chocolate.  Does not drink etoh. She smokes cigs daily about a pack per day for many years but is trying to quit.  No NSAIDs.  +pyrosis daily.  + dysphagia.  Has had 3 anterior approach spine surgeries.  Her weight fluctuates.    Has severe constipation, issues her whole life.  She takes colace nightly, will have incomplete BM in am with a lot of straining. Can manually disimpact at times.  MOM taken periodically.  Has never tried fiber supplements, never tried miralax.   Senna or correctol disagree.  She takes 3 hydrocodone to 4 a day (30-40mg a day).  Has never had colonoscopy or EGD.  IMPRESSION from recent US:  1. Normal sonographic appearance of the gallbladder and normal  caliber common bile duct.  2. 4.8 x 3.3 cm area of decreased echogenicity in the left hepatic  lobe could be focal fatty sparing but cannot exclude a hepatic  mass. Recommend MRI of the abdomen without and with contrast.  3. 4.5 cm complex right hepatic cyst.  IMPRESSION from recent MRI:  1. Lobulated, multi septated cystic structure within the right  hepatic lobe. Differential includes biliary cystadenoma or  cystadenocarcinoma. Differential considerations include simple  liver cyst. However, the presence of internal area of septation  with enhancement favors biliary cystadenoma.  2. Remaining portions of the examination are essentially normal.  Recent CBC shows slightly elevated white count at 13,000. Recent complete metabolic profile was normal. Cholesterol levels  were recently checked and they are all very high.    Review of systems: Pertinent positive and negative review of systems were noted in the above HPI section. Complete review of systems was performed and was otherwise normal.    Past Medical History  Diagnosis Date  . Liver lesion   . Anxiety   . Hypertension   . Breast changes, fibrocystic   . Idiopathic thrombocytopenia     as child    Past Surgical History  Procedure Date  . Cesarean section   . Abdominal hysterectomy   . Cervical spine surgery     x3    Current Outpatient Prescriptions  Medication Sig Dispense Refill  . ALPRAZolam (XANAX) 1 MG tablet Take 1 mg by mouth 3 (three) times daily as needed.      . atenolol (TENORMIN) 50 MG tablet Take 50 mg by mouth daily.      . citalopram (CELEXA) 40 MG tablet Take 40 mg by mouth daily.      . cyclobenzaprine (FLEXERIL) 10 MG tablet Take 15 mg by mouth as needed.      . docusate sodium (COLACE) 100 MG capsule Take 100 mg by mouth at bedtime.      . fenofibrate micronized (ANTARA) 130 MG capsule Take 130 mg by mouth daily before breakfast.      . HYDROcodone-acetaminophen (NORCO) 10-325 MG per tablet Take 1 tablet by mouth every 6 (six) hours as needed.      . lisinopril-hydrochlorothiazide (  PRINZIDE,ZESTORETIC) 20-25 MG per tablet Take 1 tablet by mouth daily.      . pantoprazole (PROTONIX) 40 MG tablet Take 40 mg by mouth daily.      . traZODone (DESYREL) 100 MG tablet Take 100 mg by mouth at bedtime.      . hydrochlorothiazide (HYDRODIURIL) 25 MG tablet Take 25 mg by mouth daily.        Allergies as of 01/06/2012 - Review Complete 01/06/2012  Allergen Reaction Noted  . Nsaids  01/06/2012    Family History  Problem Relation Age of Onset  . Heart disease Maternal Grandmother   . Hypertension Mother   . Diabetes Sister   . Lung cancer Father   . Heart disease Mother   . Colon cancer Paternal Grandmother     History   Social History  . Marital Status:  Divorced    Spouse Name: N/A    Number of Children: 3  . Years of Education: N/A   Occupational History  . Diabled    Social History Main Topics  . Smoking status: Current Everyday Smoker  . Smokeless tobacco: Never Used  . Alcohol Use: No  . Drug Use: No  . Sexually Active: Not on file   Other Topics Concern  . Not on file   Social History Narrative  . No narrative on file       Physical Exam: BP 126/84  Pulse 76  Ht 5' 4" (1.626 m)  Wt 179 lb 6.4 oz (81.375 kg)  BMI 30.79 kg/m2 Constitutional: generally well-appearing Psychiatric: alert and oriented x3 Eyes: extraocular movements intact Mouth: oral pharynx moist, no lesions Neck: supple no lymphadenopathy Cardiovascular: heart regular rate and rhythm Lungs: clear to auscultation bilaterally Abdomen: soft, nontender, nondistended, no obvious ascites, no peritoneal signs, normal bowel sounds Extremities: no lower extremity edema bilaterally Skin: no lesions on visible extremities    Assessment and plan: 50 y.o. female with  chronic GERD, chronic constipation, intermittent dysphasia, mild intermittent rectal bleeding, liver lesion   First she believes the 4-5 cm liver lesion is causing at least some of her pains. I am not sure that that is true. This liver lesion may need to be resected. I am going to arrange for evaluation by Dr. Byerly  to help determine the next best step (biopsy versus resection versus serial imaging).  She is not taking her proton pump inhibitor at the current time in relation to meals and I have helped her correct that. Smoking can contribute to her upper GI symptoms. Chronic narcotic pain medicines likely contributing to both her upper and lower symptoms. We will proceed with EGD given her dysphasia, nausea. We will proceed with colonoscopy for routine screening as well as chronic constipation and mild intermittent bleeding. Recommended she try fiber supplements again.     

## 2012-01-06 NOTE — Telephone Encounter (Signed)
Pt needs Endo colon scheduled with mac, I need to check with Aurea Graff and see when Dr Christella Hartigan [facility] days are for the [facility].

## 2012-01-06 NOTE — Telephone Encounter (Signed)
Dr. Almond Lint on 01/26/12 @10 :30am, arrive at 10:00am

## 2012-01-06 NOTE — Telephone Encounter (Signed)
Dr Valeria Batman says that the earliest she can get you spots in the [facility] for propofol is 02/26/12.  Is this to far out?  I will be out of the office until Tue.  I can get her on the schedule then either in the [facility] or [facility].  Please advise

## 2012-01-06 NOTE — Telephone Encounter (Signed)
That will be fine but if patient is not happy with the timing, then lets try an earlier time at [facility] on a propofol day. t hanks

## 2012-01-13 NOTE — Telephone Encounter (Signed)
Unable to reach pt and there is no voice mail, endo colon can be scheduled for 02/25/12 or 03/03/12

## 2012-01-14 ENCOUNTER — Other Ambulatory Visit: Payer: Self-pay

## 2012-01-14 NOTE — Telephone Encounter (Signed)
Pt would like procedures scheduled for 02/25/12 Previsit scheduled for 02/17/12 10 am pt notified and previsit letter mailed

## 2012-01-26 ENCOUNTER — Encounter (INDEPENDENT_AMBULATORY_CARE_PROVIDER_SITE_OTHER): Payer: Self-pay | Admitting: General Surgery

## 2012-01-26 ENCOUNTER — Telehealth: Payer: Self-pay

## 2012-01-26 ENCOUNTER — Ambulatory Visit (INDEPENDENT_AMBULATORY_CARE_PROVIDER_SITE_OTHER): Payer: Medicare Other | Admitting: General Surgery

## 2012-01-26 ENCOUNTER — Other Ambulatory Visit: Payer: Self-pay

## 2012-01-26 VITALS — BP 128/74 | HR 68 | Temp 97.9°F | Resp 18 | Ht 64.0 in | Wt 180.8 lb

## 2012-01-26 DIAGNOSIS — K769 Liver disease, unspecified: Secondary | ICD-10-CM

## 2012-01-26 DIAGNOSIS — R160 Hepatomegaly, not elsewhere classified: Secondary | ICD-10-CM

## 2012-01-26 DIAGNOSIS — K811 Chronic cholecystitis: Secondary | ICD-10-CM | POA: Insufficient documentation

## 2012-01-26 NOTE — Telephone Encounter (Signed)
Message copied by Donata Duff on Tue Jan 26, 2012  1:46 PM ------      Message from: [place], Melton Alar      Created: Tue Jan 26, 2012  1:25 PM       Darrick Huntsman,            [place] try to get these done sooner.  She requires MAC sedation, those spots not as easy to find.                    Praveen Coia,      Can we pull her colo/endo sooner??  Needs to be MAC but I think I have some LEC MAC spots now that may be sooner.            Thanks                  ----- Message -----         From: Almond Lint, MD         Sent: 01/26/2012  11:45 AM           To: Rachael Fee, MD            I think this pt has gallbladder disease in addition to liver cyst.  I would just do both of these at the same time.  You have her set up for EGD and colonoscopy due to +FOBT and dysphagia.  Is there any way to move that up a little?  I don't want to operate on her until those are done just in case u find something that needs surgical tx as well (i.e. Colon cancer!).      Let me know what you think.  I think the liver mass is almost definitely benign, but given her pain, and the fact that she has RUQ tenderness, I would just get it out.      Tx      FB

## 2012-01-26 NOTE — Assessment & Plan Note (Signed)
Given right upper quadrant pain, nausea, and vomiting, I would plan to take out the gallbladder at the time of surgery. The gallbladder is very close to the liver lesion, certainly from a standpoint of surgical resection it would certainly facilitate removal of the mass. I think it may actually correct her problems. I did however agree that she should go and have an EGD some of her problems include dysphasia. I also agree with colonoscopy given her chronic constipation.

## 2012-01-26 NOTE — Assessment & Plan Note (Signed)
This lesion appears benign on imaging. I would sill resect it given the septations that are present. She is very concerned about this. This type of  lesion is not amenable to percutaneous biopsy. We will schedule removal of this and the gallbladder after receiving her EGD and colonoscopy report.

## 2012-01-26 NOTE — Telephone Encounter (Signed)
Pt appt has been changed to 02/04/12, previsit on 01/28/12 and pre appt at [facility] 02/03/12 noon.  Pt is aware of changes.

## 2012-01-26 NOTE — Patient Instructions (Signed)
Get endoscopies with Dr. Christella Hartigan.  Will set up surgery after we get those reports.  May need pre op appt depending on surgery date.  Can probably get liver cyst removed laparoscopically as well.  Slightly higher chance of bleeding/open incision.    IF YOU ARE TAKING ASPIRIN, COUMADIN/WARFARIN, PLAVIX, OR OTHER BLOOD THINNER, PLEASE LET us KNOW IMMEDIATELY.  WE WILL NEED TO DISCUSS WITH THE PRESCRIBING PROVIDER IF THESE ARE SAFE TO STOP. IF THESE ARE NOT STOPPED AT THE APPROPRIATE TIME, THIS WILL RESULT IN A DELAY FOR YOUR SURGERY.  DO NOT TAKE THESE MEDICATIONS OR IBUPROFEN/NAPROXEN WITHIN A WEEK BEFORE SURGERY.   Usually we are able to remove the gallbladder with the laparoscopic equipment (minimally invasive).  If the anatomy is unclear or if there is severe infection or scar tissue we may need to make a larger incision to remove the gallbladder safely.  Some patients have evidence of gallstones remaining in their ducts that are not able to cleared surgically and may need ERCP (endoscopy) to remove them in the few days following surgery.     Otherwise, the main risks of surgery are bleeding, infection, damage to other structures, and hernia at the  incision sites.    These complications may lead to additional procedures such as drain placement or endoscopy, and in rare cases may lead to other surgeries.   These are not common risks, but do occur.     Most patients have some constipation in the week after surgery.  You may need over the counter stool softeners or laxatives if you experience difficulty having bowel movements.    Some patients experience diarrhea or experience a need to have a bowel movement shortly after eating.  Please discuss this with me when you come back if that occurs because you may require medication if it is severe.    If the following occur, call our office at [phone]: If you have a fever over 101 or pain that is severe despite narcotics. If you have  redness or drainage at the wound. If your stools become clay-colored or you become jaundiced (yellow skin/eyes) If you develop persistent nausea or vomiting.  I will follow you back up in 3-4 weeks.    Please submit any paperwork about time off work/insurance forms to the front desk.

## 2012-01-26 NOTE — Progress Notes (Signed)
Chief Complaint  Patient presents with  . New Evaluation    Liver lesion    HISTORY: Patient is a 51 year old female who presented to Dr. Christella Hartigan with complaints of nausea and vomiting. She also complains of upper abdominal pain. She thinks the pain is worse with activity. She denies any history of liver disease of which she is aware. She does not have any family history of liver disease. She also has had a positive Hemoccult test and chronic constipation. Dr. Christella Hartigan set her up for a EGD and colonoscopy due to these complaints and complaints of dysphasia.  Of note she has had 3 cervical spine fusions and continues to have pain. She does take hydrocodone on a frequent basis for this. She quit smoking a month ago.  Past Medical History  Diagnosis Date  . Liver lesion   . Anxiety   . Hypertension   . Breast changes, fibrocystic   . Idiopathic thrombocytopenia     as child  . Arthritis   . Clotting disorder   . GERD (gastroesophageal reflux disease)   . Hyperlipidemia   . Neuromuscular disorder     back  . Osteoporosis   . Nasal congestion   . Trouble swallowing   . Leg swelling   . Abdominal pain   . Rectal bleeding   . Blood in stool   . Unintentional weight loss   . Constipation   . Nausea & vomiting   . Generalized headaches   . Weakness   . Bruises easily     Past Surgical History  Procedure Date  . Cervical spine surgery 2005, 2007, 2010    x3  . Cesarean section 1988, 1994  . Abdominal hysterectomy 1997    Current Outpatient Prescriptions  Medication Sig Dispense Refill  . atorvastatin (LIPITOR) 40 MG tablet Daily.      Marland Kitchen ALPRAZolam (XANAX) 1 MG tablet Take 1 mg by mouth 3 (three) times daily as needed.      Marland Kitchen atenolol (TENORMIN) 50 MG tablet Take 50 mg by mouth daily.      . citalopram (CELEXA) 40 MG tablet Take 40 mg by mouth daily.      . cyclobenzaprine (FLEXERIL) 10 MG tablet Take 15 mg by mouth as needed.      . docusate sodium (COLACE) 100 MG capsule  Take 100 mg by mouth at bedtime.      . fenofibrate micronized (ANTARA) 130 MG capsule Take 130 mg by mouth daily before breakfast.      . hydrochlorothiazide (HYDRODIURIL) 25 MG tablet Take 25 mg by mouth daily.      Marland Kitchen HYDROcodone-acetaminophen (NORCO) 10-325 MG per tablet Take 1 tablet by mouth every 6 (six) hours as needed.      Marland Kitchen lisinopril-hydrochlorothiazide (PRINZIDE,ZESTORETIC) 20-25 MG per tablet Take 1 tablet by mouth daily.      . pantoprazole (PROTONIX) 40 MG tablet Take 40 mg by mouth daily.      . traZODone (DESYREL) 100 MG tablet Take 100 mg by mouth at bedtime.         Allergies  Allergen Reactions  . Nsaids Nausea Only    Abdominal pain     Family History  Problem Relation Age of Onset  . Heart disease Maternal Grandmother   . Hypertension Mother   . Heart disease Mother   . Diabetes Sister   . Lung cancer Father   . Cancer Father     lung  . Colon cancer Paternal Grandmother   .  Cancer Paternal Grandmother     colon     History   Social History  . Marital Status: Divorced    Spouse Name: N/A    Number of Children: 3  . Years of Education: N/A   Occupational History  . Diabled    Social History Main Topics  . Smoking status: Former Smoker    Quit date: 12/29/2011  . Smokeless tobacco: Never Used  . Alcohol Use: No  . Drug Use: No  . Sexually Active: None   Other Topics Concern  . None   Social History Narrative  . None     REVIEW OF SYSTEMS - PERTINENT POSITIVES ONLY: 12 point review of systems negative other than HPI and PMH except for weight fluctuance, congestion, joint pain, headaches, weakness, and easy bruising.    EXAM: Filed Vitals:   01/26/12 1111  BP: 128/74  Pulse: 68  Temp: 97.9 F (36.6 C)  Resp: 18    Gen:  No acute distress.  Well nourished and well groomed.   Neurological: Alert and oriented to person, place, and time. Slightly antalgic gait.  Head: Normocephalic and atraumatic.  Eyes: Conjunctivae are normal.  Pupils are equal, round, and reactive to light. No scleral icterus.  Neck: Normal range of motion. Neck supple. No tracheal deviation or thyromegaly present.  Cardiovascular: Normal rate, regular rhythm, normal heart sounds and intact distal pulses.  Exam reveals no gallop and no friction rub.  No murmur heard. Respiratory: Effort normal.  No respiratory distress. No chest wall tenderness. Breath sounds normal.  No wheezes, rales or rhonchi.  GI: Soft. Bowel sounds are normal. The abdomen is soft. There is tenderness in the RUQ.   There is no rebound and no guarding. No hepatosplenomegaly. Musculoskeletal: Normal range of motion. Extremities are nontender.  Lymphadenopathy: No cervical, preauricular, postauricular or axillary adenopathy is present Skin: Skin is warm and dry. No rash noted. No diaphoresis. No erythema. No pallor. No clubbing, cyanosis, or edema.   Psychiatric: Normal mood and affect. Behavior is normal. Judgment and thought content normal.    RADIOLOGY RESULTS: See E-Chart or I-Site for most recent results.  Images and reports are reviewed. MR abdomen  IMPRESSION:  1. Lobulated, multi septated cystic structure within the right  hepatic lobe. Differential includes biliary cystadenoma or  cystadenocarcinoma. Differential considerations include simple  liver cyst. However, the presence of internal area of septation  with enhancement favors biliary cystadenoma.  2. Remaining portions of the examination are essentially normal    ASSESSMENT AND PLAN: Cholecystitis chronic Given right upper quadrant pain, nausea, and vomiting, I would plan to take out the gallbladder at the time of surgery. The gallbladder is very close to the liver lesion, certainly from a standpoint of surgical resection it would certainly facilitate removal of the mass. I think it may actually correct her problems. I did however agree that she should go and have an EGD some of her problems include dysphasia. I  also agree with colonoscopy given her chronic constipation.  Cystic liver mass This lesion appears benign on imaging. I would sill resect it given the septations that are present. She is very concerned about this. This type of  lesion is not amenable to percutaneous biopsy. We will schedule removal of this and the gallbladder after receiving her EGD and colonoscopy report.      Maudry Diego MD Surgical Oncology, General and Endocrine Surgery [facility]      Visit Diagnoses: 1. Cystic  liver mass   2. Cholecystitis chronic     Primary Care Physician: Bennie Pierini, FNP, FNP

## 2012-01-27 NOTE — Telephone Encounter (Signed)
Thanks

## 2012-01-28 ENCOUNTER — Ambulatory Visit (AMBULATORY_SURGERY_CENTER): Payer: Medicare Other | Admitting: *Deleted

## 2012-01-28 VITALS — Ht 64.0 in | Wt 184.2 lb

## 2012-01-28 DIAGNOSIS — K219 Gastro-esophageal reflux disease without esophagitis: Secondary | ICD-10-CM

## 2012-01-28 DIAGNOSIS — K5900 Constipation, unspecified: Secondary | ICD-10-CM

## 2012-01-28 DIAGNOSIS — R1310 Dysphagia, unspecified: Secondary | ICD-10-CM

## 2012-01-28 MED ORDER — PEG-KCL-NACL-NASULF-NA ASC-C 100 G PO SOLR: ORAL | Status: DC

## 2012-02-03 ENCOUNTER — Other Ambulatory Visit: Payer: Self-pay

## 2012-02-03 ENCOUNTER — Telehealth: Payer: Self-pay | Admitting: Gastroenterology

## 2012-02-03 ENCOUNTER — Encounter (HOSPITAL_COMMUNITY): Payer: Self-pay | Admitting: *Deleted

## 2012-02-03 ENCOUNTER — Encounter (HOSPITAL_COMMUNITY)
Admission: RE | Admit: 2012-02-03 | Discharge: 2012-02-03 | Disposition: A | Discharge status: Home / Self Care | Payer: Medicare Other | Source: Ambulatory Visit | Attending: Gastroenterology | Admitting: Gastroenterology

## 2012-02-03 LAB — BASIC METABOLIC PANEL
CO2: 32 mEq/L (ref 19–32)
Chloride: 100 mEq/L (ref 96–112)
Sodium: 139 mEq/L (ref 135–145)

## 2012-02-03 LAB — HEMOGLOBIN AND HEMATOCRIT, BLOOD: HCT: 40.3 % (ref 36.0–46.0)

## 2012-02-03 NOTE — Telephone Encounter (Signed)
Pt ate 4 almonds yesterday and was scared she had messed up her prep.  I explained that she could still have her procedure but to make sure she follows her instructions the rest of today and tomorrow.  Pt agreed

## 2012-02-03 NOTE — Anesthesia Preprocedure Evaluation (Signed)
Anesthesia Evaluation  Patient identified by MRN, date of birth, ID band Patient awake    Reviewed: Allergy & Precautions, H&P , NPO status , Patient's Chart, lab work & pertinent test results, reviewed documented beta blocker date and time   Airway Mallampati: II TM Distance: >3 FB Neck ROM: Full    Dental  (+) Teeth Intact   Pulmonary neg pulmonary ROS,  breath sounds clear to auscultation        Cardiovascular hypertension, Pt. on medications Rhythm:Regular Rate:Normal  Denies cardiac symptoms   Neuro/Psych negative neurological ROS  negative psych ROS   GI/Hepatic Neg liver ROS, ABD pain/dysphagia   Endo/Other  negative endocrine ROS  Renal/GU negative Renal ROS  negative genitourinary   Musculoskeletal negative musculoskeletal ROS (+)   Abdominal   Peds negative pediatric ROS (+)  Hematology negative hematology ROS (+)   Anesthesia Other Findings   Reproductive/Obstetrics negative OB ROS                           Anesthesia Physical Anesthesia Plan  ASA: II  Anesthesia Plan: MAC   Post-op Pain Management:    Induction: Intravenous  Airway Management Planned: Mask  Additional Equipment:   Intra-op Plan:   Post-operative Plan:   Informed Consent: I have reviewed the patients History and Physical, chart, labs and discussed the procedure including the risks, benefits and alternatives for the proposed anesthesia with the patient or authorized representative who has indicated his/her understanding and acceptance.   Dental advisory given  Plan Discussed with: CRNA and Surgeon  Anesthesia Plan Comments:         Anesthesia Quick Evaluation

## 2012-02-04 ENCOUNTER — Encounter (HOSPITAL_COMMUNITY)
Admission: RE | Disposition: A | Discharge status: Home / Self Care | Payer: Self-pay | Source: Ambulatory Visit | Attending: Gastroenterology

## 2012-02-04 ENCOUNTER — Encounter (HOSPITAL_COMMUNITY): Payer: Self-pay | Admitting: *Deleted

## 2012-02-04 ENCOUNTER — Ambulatory Visit (HOSPITAL_COMMUNITY)
Admission: RE | Admit: 2012-02-04 | Discharge: 2012-02-04 | Disposition: A | Discharge status: Home / Self Care | Payer: Medicare Other | Source: Ambulatory Visit | Attending: Gastroenterology | Admitting: Gastroenterology

## 2012-02-04 ENCOUNTER — Encounter (HOSPITAL_COMMUNITY): Payer: Self-pay | Admitting: Anesthesiology

## 2012-02-04 ENCOUNTER — Ambulatory Visit (HOSPITAL_COMMUNITY): Payer: Medicare Other | Admitting: Anesthesiology

## 2012-02-04 DIAGNOSIS — R1013 Epigastric pain: Secondary | ICD-10-CM

## 2012-02-04 DIAGNOSIS — K3189 Other diseases of stomach and duodenum: Secondary | ICD-10-CM | POA: Insufficient documentation

## 2012-02-04 DIAGNOSIS — K2970 Gastritis, unspecified, without bleeding: Secondary | ICD-10-CM

## 2012-02-04 DIAGNOSIS — K2990 Gastroduodenitis, unspecified, without bleeding: Secondary | ICD-10-CM

## 2012-02-04 DIAGNOSIS — Z01810 Encounter for preprocedural cardiovascular examination: Secondary | ICD-10-CM | POA: Insufficient documentation

## 2012-02-04 DIAGNOSIS — K219 Gastro-esophageal reflux disease without esophagitis: Secondary | ICD-10-CM

## 2012-02-04 DIAGNOSIS — R1310 Dysphagia, unspecified: Secondary | ICD-10-CM

## 2012-02-04 DIAGNOSIS — K5909 Other constipation: Secondary | ICD-10-CM

## 2012-02-04 DIAGNOSIS — Z01812 Encounter for preprocedural laboratory examination: Secondary | ICD-10-CM | POA: Insufficient documentation

## 2012-02-04 DIAGNOSIS — K5730 Diverticulosis of large intestine without perforation or abscess without bleeding: Secondary | ICD-10-CM

## 2012-02-04 DIAGNOSIS — K319 Disease of stomach and duodenum, unspecified: Secondary | ICD-10-CM | POA: Insufficient documentation

## 2012-02-04 DIAGNOSIS — K5900 Constipation, unspecified: Secondary | ICD-10-CM

## 2012-02-04 DIAGNOSIS — I10 Essential (primary) hypertension: Secondary | ICD-10-CM | POA: Insufficient documentation

## 2012-02-04 DIAGNOSIS — Z79899 Other long term (current) drug therapy: Secondary | ICD-10-CM | POA: Insufficient documentation

## 2012-02-04 HISTORY — DX: Major depressive disorder, single episode, unspecified: F32.9

## 2012-02-04 HISTORY — DX: Depression, unspecified: F32.A

## 2012-02-04 HISTORY — DX: Splenomegaly, not elsewhere classified: R16.1

## 2012-02-04 HISTORY — DX: Other cervical disc degeneration, unspecified cervical region: M50.30

## 2012-02-04 HISTORY — PX: OTHER SURGICAL HISTORY: SHX169

## 2012-02-04 HISTORY — DX: Unspecified convulsions: R56.9

## 2012-02-04 SURGERY — ESOPHAGOGASTRODUODENOSCOPY (EGD) WITH PROPOFOL
Anesthesia: Monitor Anesthesia Care

## 2012-02-04 MED ORDER — PANTOPRAZOLE SODIUM 40 MG PO TBEC
40.0000 mg | DELAYED_RELEASE_TABLET | Freq: Two times a day (BID) | ORAL | Status: DC
  Filled 2012-02-04 (×4): qty 1

## 2012-02-04 MED ORDER — LACTATED RINGERS IV SOLN
INTRAVENOUS | Status: DC
  Administered 2012-02-04: 1000 mL via INTRAVENOUS

## 2012-02-04 MED ORDER — PANTOPRAZOLE SODIUM 40 MG PO TBEC
40.0000 mg | DELAYED_RELEASE_TABLET | Freq: Two times a day (BID) | ORAL | Status: DC
  Filled 2012-02-04 (×3): qty 1

## 2012-02-04 MED ORDER — PROPOFOL 10 MG/ML IV EMUL
INTRAVENOUS | Status: DC | PRN
  Administered 2012-02-04: 50 ug/kg/min via INTRAVENOUS

## 2012-02-04 MED ORDER — FENTANYL CITRATE 0.05 MG/ML IJ SOLN
INTRAMUSCULAR | Status: DC | PRN
  Administered 2012-02-04: 100 ug via INTRAVENOUS

## 2012-02-04 MED ORDER — BUTAMBEN-TETRACAINE-BENZOCAINE 2-2-14 % EX AERO
INHALATION_SPRAY | CUTANEOUS | Status: DC | PRN
  Administered 2012-02-04: 2 via TOPICAL

## 2012-02-04 MED ORDER — MIDAZOLAM HCL 5 MG/5ML IJ SOLN
INTRAMUSCULAR | Status: DC | PRN
  Administered 2012-02-04: 2 mg via INTRAVENOUS

## 2012-02-04 MED ORDER — KETAMINE HCL 10 MG/ML IJ SOLN
INTRAMUSCULAR | Status: DC | PRN
  Administered 2012-02-04: 20 mg via INTRAVENOUS

## 2012-02-04 SURGICAL SUPPLY — 24 items

## 2012-02-04 NOTE — Addendum Note (Signed)
Addendum  created 02/04/12 0845 by Gaetano Hawthorne, MD   Modules edited:Orders, PRL Based Order Sets

## 2012-02-04 NOTE — H&P (View-Only) (Signed)
HPI: This is a  very pleasant 51 year old woman whom I am meeting for the first time today.  She had [place] for abdominal pains.  Found liver lesion, followed up with MRI.  See those reports below.  She has nausea, vomiting in AM. Poor appetite.  SHe tried PPI (protonix) and that helped somewhat, but not completely.  She takes this before a meal by 3-4 hours.  She wakes with nausea, and often vomits.  She drinks 1-2 cups coffee in AM.  NO fast food.  No chocolate.  Does not drink etoh. She smokes cigs daily about a pack per day for many years but is trying to quit.  No NSAIDs.  +pyrosis daily.  + dysphagia.  Has had 3 anterior approach spine surgeries.  Her weight fluctuates.    Has severe constipation, issues her whole life.  She takes colace nightly, will have incomplete BM in am with a lot of straining. Can manually disimpact at times.  MOM taken periodically.  Has never tried fiber supplements, never tried miralax.   Senna or correctol disagree.  She takes 3 hydrocodone to 4 a day (30-40mg  a day).  Has never had colonoscopy or EGD.  IMPRESSION from recent US:  1. Normal sonographic appearance of the gallbladder and normal  caliber common bile duct.  2. 4.8 x 3.3 cm area of decreased echogenicity in the left hepatic  lobe could be focal fatty sparing but cannot exclude a hepatic  mass. Recommend MRI of the abdomen without and with contrast.  3. 4.5 cm complex right hepatic cyst.  IMPRESSION from recent MRI:  1. Lobulated, multi septated cystic structure within the right  hepatic lobe. Differential includes biliary cystadenoma or  cystadenocarcinoma. Differential considerations include simple  liver cyst. However, the presence of internal area of septation  with enhancement favors biliary cystadenoma.  2. Remaining portions of the examination are essentially normal.  Recent CBC shows slightly elevated white count at 13,000. Recent complete metabolic profile was normal. Cholesterol levels  were recently checked and they are all very high.    Review of systems: Pertinent positive and negative review of systems were noted in the above HPI section. Complete review of systems was performed and was otherwise normal.    Past Medical History  Diagnosis Date  . Liver lesion   . Anxiety   . Hypertension   . Breast changes, fibrocystic   . Idiopathic thrombocytopenia     as child    Past Surgical History  Procedure Date  . Cesarean section   . Abdominal hysterectomy   . Cervical spine surgery     x3    Current Outpatient Prescriptions  Medication Sig Dispense Refill  . ALPRAZolam (XANAX) 1 MG tablet Take 1 mg by mouth 3 (three) times daily as needed.      Marland Kitchen atenolol (TENORMIN) 50 MG tablet Take 50 mg by mouth daily.      . citalopram (CELEXA) 40 MG tablet Take 40 mg by mouth daily.      . cyclobenzaprine (FLEXERIL) 10 MG tablet Take 15 mg by mouth as needed.      . docusate sodium (COLACE) 100 MG capsule Take 100 mg by mouth at bedtime.      . fenofibrate micronized (ANTARA) 130 MG capsule Take 130 mg by mouth daily before breakfast.      . HYDROcodone-acetaminophen (NORCO) 10-325 MG per tablet Take 1 tablet by mouth every 6 (six) hours as needed.      Marland Kitchen lisinopril-hydrochlorothiazide (  PRINZIDE,ZESTORETIC) 20-25 MG per tablet Take 1 tablet by mouth daily.      . pantoprazole (PROTONIX) 40 MG tablet Take 40 mg by mouth daily.      . traZODone (DESYREL) 100 MG tablet Take 100 mg by mouth at bedtime.      . hydrochlorothiazide (HYDRODIURIL) 25 MG tablet Take 25 mg by mouth daily.        Allergies as of 01/06/2012 - Review Complete 01/06/2012  Allergen Reaction Noted  . Nsaids  01/06/2012    Family History  Problem Relation Age of Onset  . Heart disease Maternal Grandmother   . Hypertension Mother   . Diabetes Sister   . Lung cancer Father   . Heart disease Mother   . Colon cancer Paternal Grandmother     History   Social History  . Marital Status:  Divorced    Spouse Name: N/A    Number of Children: 3  . Years of Education: N/A   Occupational History  . Diabled    Social History Main Topics  . Smoking status: Current Everyday Smoker  . Smokeless tobacco: Never Used  . Alcohol Use: No  . Drug Use: No  . Sexually Active: Not on file   Other Topics Concern  . Not on file   Social History Narrative  . No narrative on file       Physical Exam: BP 126/84  Pulse 76  Ht 5\' 4"  (1.626 m)  Wt 179 lb 6.4 oz (81.375 kg)  BMI 30.79 kg/m2 Constitutional: generally well-appearing Psychiatric: alert and oriented x3 Eyes: extraocular movements intact Mouth: oral pharynx moist, no lesions Neck: supple no lymphadenopathy Cardiovascular: heart regular rate and rhythm Lungs: clear to auscultation bilaterally Abdomen: soft, nontender, nondistended, no obvious ascites, no peritoneal signs, normal bowel sounds Extremities: no lower extremity edema bilaterally Skin: no lesions on visible extremities    Assessment and plan: 51 y.o. female with  chronic GERD, chronic constipation, intermittent dysphasia, mild intermittent rectal bleeding, liver lesion   First she believes the 4-5 cm liver lesion is causing at least some of her pains. I am not sure that that is true. This liver lesion may need to be resected. I am going to arrange for evaluation by Dr. Donell Beers  to help determine the next best step (biopsy versus resection versus serial imaging).  She is not taking her proton pump inhibitor at the current time in relation to meals and I have helped her correct that. Smoking can contribute to her upper GI symptoms. Chronic narcotic pain medicines likely contributing to both her upper and lower symptoms. We will proceed with EGD given her dysphasia, nausea. We will proceed with colonoscopy for routine screening as well as chronic constipation and mild intermittent bleeding. Recommended she try fiber supplements again.

## 2012-02-04 NOTE — Anesthesia Postprocedure Evaluation (Signed)
  Anesthesia Post-op Note  Patient: [name]  Procedure(s) Performed: Procedure(s) (LRB): ESOPHAGOGASTRODUODENOSCOPY (EGD) WITH PROPOFOL (N/A) COLONOSCOPY WITH PROPOFOL (N/A)  Patient Location: PACU  Anesthesia Type: MAC  Level of Consciousness: awake and alert   Airway and Oxygen Therapy: Patient Spontanous Breathing  Post-op Pain: mild  Post-op Assessment: Post-op Vital signs reviewed, Patient's Cardiovascular Status Stable, Respiratory Function Stable, Patent Airway and No signs of Nausea or vomiting  Post-op Vital Signs: stable  Complications: No apparent anesthesia complications

## 2012-02-04 NOTE — Op Note (Signed)
[facility] [address] [place], [place]  [address]  ENDOSCOPY PROCEDURE REPORT  PATIENT:  [name], [name]  MR#:  [id number] BIRTHDATE:  1961/06/09, 50 yrs. old  GENDER:  female ENDOSCOPIST:  Rachael Fee, MD Referred by:  Wardell Heath, N.P. PROCEDURE DATE:  02/04/2012 PROCEDURE:  EGD with biopsy, 43239 ASA CLASS:  Class II INDICATIONS:  dysphagia, dyspepsia, GERD MEDICATIONS:  MAC sedation, administered by CRNA TOPICAL ANESTHETIC:  Cetacaine Spray  DESCRIPTION OF PROCEDURE:   After the risks benefits and alternatives of the procedure were thoroughly explained, informed consent was obtained.  The Pentax Gastroscope [id number] endoscope was introduced through the mouth and advanced to the second portion of the duodenum, without limitations.  The instrument was slowly withdrawn as the mucosa was fully examined. <<PROCEDUREIMAGES>> Mild gastritis was found (see image3) and biopsies were taken, sent to pathoogy.  There were two linear erosions in proximal stomach that appeared mechanical Sheria Lang erosions?).  Otherwise the examination was normal (see image1, image2, and image5). Retroflexed views revealed no abnormalities.    The scope was then withdrawn from the patient and the procedure completed. COMPLICATIONS:  None  ENDOSCOPIC IMPRESSION: 1) Mild gastritis, linear erosions in proximal stomach. Biopsies taken for H. pylori 2) Otherwise normal examination  RECOMMENDATIONS: Will increase PPI to twice daily, new prescription was called in for twice daily protonix. Chew your food well, eat slowly, take small bites.  ______________________________ Rachael Fee, MD  n. eSIGNED:   Rachael Fee at 02/04/2012 08:11 AM  Leron Croak, [phone]

## 2012-02-04 NOTE — Discharge Instructions (Addendum)
YOU HAD AN ENDOSCOPIC PROCEDURE TODAY: Refer to the procedure report that was given to you for any specific questions about what was found during the examination.  If the procedure report does not answer your questions, please call your gastroenterologist to clarify.  YOU SHOULD EXPECT: Some feelings of bloating in the abdomen. Passage of more gas than usual.  Walking can help get rid of the air that was put into your GI tract during the procedure and reduce the bloating. If you had a lower endoscopy (such as a colonoscopy or flexible sigmoidoscopy) you may notice spotting of blood in your stool or on the toilet paper.   DIET: Your first meal following the procedure should be a light meal and then it is ok to progress to your normal diet.  A half-sandwich or bowl of soup is an example of a good first meal.  Heavy or fried foods are harder to digest and may make you feel nasueas or bloated.  Drink plenty of fluids but you should avoid alcoholic beverages for 24 hours.  ACTIVITY: Your care partner should take you home directly after the procedure.  You should plan to take it easy, moving slowly for the rest of the day.  You can resume normal activity the day after the procedure however you should NOT DRIVE or use heavy machinery for 24 hours (because of the sedation medicines used during the test).    SYMPTOMS TO REPORT IMMEDIATELY  A gastroenterologist can be reached at any hour.  Please call your doctor's office for any of the following symptoms:   Following lower endoscopy (colonoscopy, flexible sigmoidoscopy)  Excessive amounts of blood in the stool  Significant tenderness, worsening of abdominal pains  Swelling of the abdomen that is new, acute  Fever of 100 or higher  Following upper endoscopy (EGD, EUS, ERCP)  Vomiting of blood or coffee ground material  New, significant abdominal pain  New, significant chest pain or pain under the shoulder blades  Painful or persistently difficult  swallowing  New shortness of breath  Black, tarry-looking stools  FOLLOW UP: If any biopsies were taken you will be contacted by phone or by letter within the next 1-3 weeks.  Call your gastroenterologist if you have not heard about the biopsies in 3 weeks.  Please also call your gastroenterologist's office with any specific questions about appointments or follow up tests. Endoscopy Care After Please read the instructions outlined below and refer to this sheet in the next few weeks. These discharge instructions provide you with general information on caring for yourself after you leave the hospital. Your doctor may also give you specific instructions. While your treatment has been planned according to the most current medical practices available, unavoidable complications occasionally occur. If you have any problems or questions after discharge, please call your doctor. HOME CARE INSTRUCTIONS Activity  You may resume your regular activity but move at a slower pace for the next 24 hours.   Take frequent rest periods for the next 24 hours.   Walking will help expel (get rid of) the air and reduce the bloated feeling in your abdomen.   No driving for 24 hours (because of the anesthesia (medicine) used during the test).   You may shower.   Do not sign any important legal documents or operate any machinery for 24 hours (because of the anesthesia used during the test).  Nutrition  Drink plenty of fluids.   You may resume your normal diet.   Begin with   a light meal and progress to your normal diet.   Avoid alcoholic beverages for 24 hours or as instructed by your caregiver.  Medications You may resume your normal medications unless your caregiver tells you otherwise. What you can expect today  You may experience abdominal discomfort such as a feeling of fullness or "gas" pains.   You may experience a sore throat for 2 to 3 days. This is normal. Gargling with salt water may help this.    Follow-up Your doctor will discuss the results of your test with you. SEEK IMMEDIATE MEDICAL CARE IF:  You have excessive nausea (feeling sick to your stomach) and/or vomiting.   You have severe abdominal pain and distention (swelling).   You have trouble swallowing.   You have a temperature over 100 F (37.8 C).   You have rectal bleeding or vomiting of blood.  Document Released: 06/09/2004 Document Revised: 10/15/2011 Document Reviewed: 12/21/2007 Exit[employer] Patient Information 2012 [employer]. 

## 2012-02-04 NOTE — Op Note (Signed)
[facility] [address] [place], [place]  [address]  COLONOSCOPY PROCEDURE REPORT  PATIENT:  [name], [name]  MR#:  [id number] BIRTHDATE:  Feb 12, 1961, 50 yrs. old  GENDER:  female ENDOSCOPIST:  Rachael Fee, MD REF. BY:  Wardell Heath, N.P. PROCEDURE DATE:  02/04/2012 PROCEDURE:  Colonoscopy 32440 ASA CLASS:  Class II INDICATIONS:  chronic constipation MEDICATIONS:   MAC sedation, administered by CRNA  DESCRIPTION OF PROCEDURE:   After the risks benefits and alternatives of the procedure were thoroughly explained, informed consent was obtained.  Digital rectal exam was performed and revealed no rectal masses.   The Pentax Colonoscope [id number] endoscope was introduced through the anus and advanced to the terminal ileum which was intubated for a short distance, without limitations.  The quality of the prep was good..  The instrument was then slowly withdrawn as the colon was fully examined. <<PROCEDUREIMAGES>> FINDINGS:  The terminal ileum appeared normal.  Mild diverticulosis was found in the sigmoid to descending colon segments (see image001).  This was otherwise a normal examination of the colon (see image002, image003, and image004).   Retroflexed views in the rectum revealed no abnormalities. COMPLICATIONS:  None  ENDOSCOPIC IMPRESSION: 1) Normal terminal ileum 2) Mild diverticulosis in the sigmoid to descending colon segments 3) Otherwise normal examination. No polyps or cancrs  RECOMMENDATIONS: 1) You should continue to follow colorectal cancer screening guidelines for "routine risk" patients with a repeat colonoscopy in 10 years. There is no need for FOBT (stool) testing for at least 5 years. 2) Cutting back on naroctic pain medicines may help your constipation 3) Continue daily fiber and also consider adding once daily miralax for contipatiopn if needed.  ______________________________ Rachael Fee, MD  cc: Almond Lint,  MD  n. Rosalie Doctor:   Rachael Fee at 02/04/2012 07:59 AM  Leron Croak, [phone]

## 2012-02-04 NOTE — Transfer of Care (Signed)
Immediate Anesthesia Transfer of Care Note  Patient: [name]  Procedure(s) Performed: Procedure(s) (LRB): ESOPHAGOGASTRODUODENOSCOPY (EGD) WITH PROPOFOL (N/A) COLONOSCOPY WITH PROPOFOL (N/A)  Patient Location: PACU  Anesthesia Type: MAC  Level of Consciousness: sedated  Airway & Oxygen Therapy: Patient Spontanous Breathing and Patient connected to nasal cannula oxygen  Post-op Assessment: Report given to PACU RN and Post -op Vital signs reviewed and stable  Post vital signs: Reviewed and stable  Complications: No apparent anesthesia complications

## 2012-02-04 NOTE — Interval H&P Note (Signed)
History and Physical Interval Note:  02/04/2012 7:20 AM  [name]  has presented today for surgery, with the diagnosis of GERD (gastroesophageal reflux disease) [530.81] Constipation [564.00] Liver lesion [573.8]  The various methods of treatment have been discussed with the patient and family. After consideration of risks, benefits and other options for treatment, the patient has consented to  Procedure(s) (LRB): ESOPHAGOGASTRODUODENOSCOPY (EGD) WITH PROPOFOL (N/A) COLONOSCOPY WITH PROPOFOL (N/A) as a surgical intervention .  The patients' history has been reviewed, patient examined, no change in status, stable for surgery.  I have reviewed the patients' chart and labs.  Questions were answered to the patient's satisfaction.     Rob Bunting

## 2012-02-09 ENCOUNTER — Telehealth: Payer: Self-pay | Admitting: Gastroenterology

## 2012-02-09 MED ORDER — PANTOPRAZOLE SODIUM 40 MG PO TBEC: 40.0000 mg | DELAYED_RELEASE_TABLET | Freq: Two times a day (BID) | ORAL | Status: DC

## 2012-02-09 NOTE — Telephone Encounter (Signed)
The pt did not get her protonix bid sent to the pharmacy.  I sent the med today pt aware

## 2012-02-15 ENCOUNTER — Other Ambulatory Visit: Payer: Self-pay

## 2012-02-15 MED ORDER — PANTOPRAZOLE SODIUM 40 MG PO TBEC: 40.0000 mg | DELAYED_RELEASE_TABLET | Freq: Two times a day (BID) | ORAL | Status: DC

## 2012-03-07 ENCOUNTER — Encounter (HOSPITAL_COMMUNITY): Payer: Self-pay | Admitting: Pharmacy Technician

## 2012-03-08 ENCOUNTER — Encounter (HOSPITAL_COMMUNITY)
Admission: RE | Admit: 2012-03-08 | Discharge: 2012-03-08 | Disposition: A | Discharge status: Home / Self Care | Payer: Medicare Other | Source: Ambulatory Visit | Attending: General Surgery | Admitting: General Surgery

## 2012-03-08 ENCOUNTER — Encounter (HOSPITAL_COMMUNITY): Payer: Self-pay

## 2012-03-08 ENCOUNTER — Telehealth (INDEPENDENT_AMBULATORY_CARE_PROVIDER_SITE_OTHER): Payer: Self-pay

## 2012-03-08 ENCOUNTER — Ambulatory Visit (HOSPITAL_COMMUNITY)
Admission: RE | Admit: 2012-03-08 | Discharge: 2012-03-08 | Disposition: A | Discharge status: Home / Self Care | Payer: Medicare Other | Source: Ambulatory Visit | Attending: General Surgery | Admitting: General Surgery

## 2012-03-08 DIAGNOSIS — I10 Essential (primary) hypertension: Secondary | ICD-10-CM | POA: Insufficient documentation

## 2012-03-08 DIAGNOSIS — K811 Chronic cholecystitis: Secondary | ICD-10-CM | POA: Insufficient documentation

## 2012-03-08 DIAGNOSIS — K7689 Other specified diseases of liver: Secondary | ICD-10-CM | POA: Insufficient documentation

## 2012-03-08 DIAGNOSIS — K219 Gastro-esophageal reflux disease without esophagitis: Secondary | ICD-10-CM | POA: Insufficient documentation

## 2012-03-08 DIAGNOSIS — Z01818 Encounter for other preprocedural examination: Secondary | ICD-10-CM | POA: Insufficient documentation

## 2012-03-08 DIAGNOSIS — Z01812 Encounter for preprocedural laboratory examination: Secondary | ICD-10-CM | POA: Insufficient documentation

## 2012-03-08 HISTORY — DX: Panic disorder (episodic paroxysmal anxiety): F41.0

## 2012-03-08 HISTORY — DX: Unspecified hemorrhoids: K64.9

## 2012-03-08 HISTORY — DX: Sleep apnea, unspecified: G47.30

## 2012-03-08 LAB — CBC
HCT: 42.9 % (ref 36.0–46.0)
MCH: 29.2 pg (ref 26.0–34.0)
MCHC: 33.1 g/dL (ref 30.0–36.0)
MCV: 88.3 fL (ref 78.0–100.0)
RDW: 13 % (ref 11.5–15.5)

## 2012-03-08 LAB — COMPREHENSIVE METABOLIC PANEL
Albumin: 4 g/dL (ref 3.5–5.2)
BUN: 17 mg/dL (ref 6–23)
Calcium: 9.7 mg/dL (ref 8.4–10.5)
Creatinine, Ser: 0.78 mg/dL (ref 0.50–1.10)
Total Protein: 7.4 g/dL (ref 6.0–8.3)

## 2012-03-08 LAB — PROTIME-INR
INR: 0.99 (ref 0.00–1.49)
Prothrombin Time: 13.3 seconds (ref 11.6–15.2)

## 2012-03-08 LAB — SURGICAL PCR SCREEN: Staphylococcus aureus: NEGATIVE

## 2012-03-08 NOTE — Progress Notes (Signed)
03/08/12 1435  OBSTRUCTIVE SLEEP APNEA  Have you ever been diagnosed with sleep apnea through a sleep study? No  Do you snore loudly (loud enough to be heard through closed doors)?  1  Do you often feel tired, fatigued, or sleepy during the daytime? 1  Has anyone observed you stop breathing during your sleep? 0  Do you have, or are you being treated for high blood pressure? 1  BMI more than 35 kg/m2? 0  Age over 53 years old? 1  Neck circumference greater than 40 cm/18 inches? 0  Gender: 0  Obstructive Sleep Apnea Score 4   Score 4 or greater  Updated health history;Results sent to PCP

## 2012-03-08 NOTE — Patient Instructions (Addendum)
20 [name]  03/08/2012   Your procedure is scheduled on:  03-14-12 monday  Report to [facility] at 0530  AM.  Call this number if you have problems the morning of surgery: [phone]   Remember:   Do not eat food or drink liquids:After Midnight.  .  Take these medicines the morning of surgery with A SIP OF WATER: pantaprazole, alprazolam, atenolol, citalopram, hydrocodone if needed,    Do not wear jewelry or make up.  Do not wear lotions, powders, or perfumes.Do not wear deodorant.    Do not bring valuables to the hospital.  Contacts, dentures or bridgework may not be worn into surgery.  Leave suitcase in the car. After surgery it may be brought to your room.  For patients admitted to the hospital, checkout time is 11:00 AM the day of discharge.     Special Instructions: CHG Shower Use Special Wash: 1/2 bottle night before surgery and 1/2 bottle morning of surgery.neck down avoid private area, no shaving for 2 days before surgery   Please read over the following fact sheets that you were given: MRSA Information  [facility] pre op nurse phone number [phone], call if needed

## 2012-03-08 NOTE — Pre-Procedure Instructions (Signed)
ekh 02-03-12 epic

## 2012-03-08 NOTE — Telephone Encounter (Signed)
[name] called about the pt because she is there now for her preop visit and they have no Epic orders.  Surgery is May 6th.  She will do per Anesthesia but if Dr Donell Beers wants anything else, please let her know.

## 2012-03-09 ENCOUNTER — Other Ambulatory Visit (INDEPENDENT_AMBULATORY_CARE_PROVIDER_SITE_OTHER): Payer: Self-pay | Admitting: General Surgery

## 2012-03-09 ENCOUNTER — Encounter (HOSPITAL_COMMUNITY): Payer: Self-pay

## 2012-03-09 NOTE — Pre-Procedure Instructions (Signed)
CALLED AND INSTRUCTED PT FLEETS ENEMA NIGHT BEFORE SURGERY

## 2012-03-13 ENCOUNTER — Encounter (HOSPITAL_COMMUNITY): Payer: Self-pay | Admitting: Anesthesiology

## 2012-03-13 NOTE — Anesthesia Preprocedure Evaluation (Addendum)
Anesthesia Evaluation    Airway Mallampati: II TM Distance: >3 FB Neck ROM: Full    Dental   Rough edges upper incisors.:   Pulmonary Current Smoker,  She denies sleep apnea to me. Quit smoking, but has recently been smoking some. breath sounds clear to auscultation  Pulmonary exam normal       Cardiovascular hypertension, Pt. on medications and Pt. on home beta blockers Rhythm:Regular Rate:Normal     Neuro/Psych  Headaches, Seizures -,  PSYCHIATRIC DISORDERS Anxiety Depression  Neuromuscular disease    GI/Hepatic GERD-  Medicated,  Endo/Other    Renal/GU      Musculoskeletal   Abdominal (+) + obese,   Peds  Hematology   Anesthesia Other Findings   Reproductive/Obstetrics                         Anesthesia Physical Anesthesia Plan  ASA: II  Anesthesia Plan: General   Post-op Pain Management:    Induction: Intravenous  Airway Management Planned: Oral ETT  Additional Equipment:   Intra-op Plan:   Post-operative Plan: Extubation in OR  Informed Consent: I have reviewed the patients History and Physical, chart, labs and discussed the procedure including the risks, benefits and alternatives for the proposed anesthesia with the patient or authorized representative who has indicated his/her understanding and acceptance.   Dental advisory given  Plan Discussed with: CRNA  Anesthesia Plan Comments:         Anesthesia Quick Evaluation

## 2012-03-14 ENCOUNTER — Encounter (HOSPITAL_COMMUNITY): Payer: Self-pay | Admitting: Anesthesiology

## 2012-03-14 ENCOUNTER — Ambulatory Visit (HOSPITAL_COMMUNITY): Payer: Medicare Other | Admitting: Anesthesiology

## 2012-03-14 ENCOUNTER — Ambulatory Visit (HOSPITAL_COMMUNITY): Payer: Medicare Other

## 2012-03-14 ENCOUNTER — Encounter (HOSPITAL_COMMUNITY)
Admission: RE | Disposition: A | Discharge status: Home / Self Care | Payer: Self-pay | Source: Ambulatory Visit | Attending: General Surgery

## 2012-03-14 ENCOUNTER — Inpatient Hospital Stay (HOSPITAL_COMMUNITY)
Admission: RE | Admit: 2012-03-14 | Discharge: 2012-03-19 | DRG: 406 | Disposition: A | Discharge status: Home / Self Care | Payer: Medicare Other | Source: Ambulatory Visit | Attending: General Surgery | Admitting: General Surgery

## 2012-03-14 ENCOUNTER — Encounter (HOSPITAL_COMMUNITY): Payer: Self-pay | Admitting: *Deleted

## 2012-03-14 DIAGNOSIS — K811 Chronic cholecystitis: Secondary | ICD-10-CM

## 2012-03-14 DIAGNOSIS — K7689 Other specified diseases of liver: Secondary | ICD-10-CM | POA: Diagnosis present

## 2012-03-14 DIAGNOSIS — D62 Acute posthemorrhagic anemia: Secondary | ICD-10-CM | POA: Diagnosis not present

## 2012-03-14 DIAGNOSIS — K560 Paralytic ileus: Secondary | ICD-10-CM | POA: Diagnosis not present

## 2012-03-14 DIAGNOSIS — F411 Generalized anxiety disorder: Secondary | ICD-10-CM | POA: Diagnosis present

## 2012-03-14 DIAGNOSIS — K219 Gastro-esophageal reflux disease without esophagitis: Secondary | ICD-10-CM | POA: Diagnosis present

## 2012-03-14 DIAGNOSIS — F3289 Other specified depressive episodes: Secondary | ICD-10-CM | POA: Diagnosis present

## 2012-03-14 DIAGNOSIS — G43909 Migraine, unspecified, not intractable, without status migrainosus: Secondary | ICD-10-CM | POA: Diagnosis present

## 2012-03-14 DIAGNOSIS — J9819 Other pulmonary collapse: Secondary | ICD-10-CM | POA: Diagnosis not present

## 2012-03-14 DIAGNOSIS — I10 Essential (primary) hypertension: Secondary | ICD-10-CM | POA: Diagnosis present

## 2012-03-14 DIAGNOSIS — F329 Major depressive disorder, single episode, unspecified: Secondary | ICD-10-CM | POA: Diagnosis present

## 2012-03-14 DIAGNOSIS — F17200 Nicotine dependence, unspecified, uncomplicated: Secondary | ICD-10-CM | POA: Diagnosis present

## 2012-03-14 DIAGNOSIS — E860 Dehydration: Secondary | ICD-10-CM | POA: Diagnosis present

## 2012-03-14 DIAGNOSIS — E669 Obesity, unspecified: Secondary | ICD-10-CM | POA: Diagnosis present

## 2012-03-14 HISTORY — PX: CHOLECYSTECTOMY: SHX55

## 2012-03-14 LAB — COMPREHENSIVE METABOLIC PANEL
AST: 407 U/L — ABNORMAL HIGH (ref 0–37)
Albumin: 3.8 g/dL (ref 3.5–5.2)
BUN: 17 mg/dL (ref 6–23)
Creatinine, Ser: 0.64 mg/dL (ref 0.50–1.10)
Potassium: 3.7 mEq/L (ref 3.5–5.1)
Total Protein: 6.9 g/dL (ref 6.0–8.3)

## 2012-03-14 LAB — TYPE AND SCREEN: ABO/RH(D): O NEG

## 2012-03-14 LAB — CBC
HCT: 39.9 % (ref 36.0–46.0)
MCHC: 33.1 g/dL (ref 30.0–36.0)
MCV: 89.3 fL (ref 78.0–100.0)
MCV: 88.1 fL (ref 78.0–100.0)
Platelets: 270 10*3/uL (ref 150–400)
Platelets: 269 10*3/uL (ref 150–400)
RBC: 4.59 MIL/uL (ref 3.87–5.11)
RDW: 13.1 % (ref 11.5–15.5)
RDW: 13 % (ref 11.5–15.5)
WBC: 17.1 10*3/uL — ABNORMAL HIGH (ref 4.0–10.5)

## 2012-03-14 LAB — CREATININE, SERUM
Creatinine, Ser: 0.69 mg/dL (ref 0.50–1.10)
GFR calc Af Amer: >90 mL/min (ref >90)

## 2012-03-14 LAB — ABO/RH: ABO/RH(D): O NEG

## 2012-03-14 SURGERY — UNROOFING, CYST, LIVER, LAPAROSCOPIC
Anesthesia: General | Site: Abdomen | Wound class: Contaminated

## 2012-03-14 MED ORDER — MIDAZOLAM HCL 5 MG/ML IJ SOLN
INTRAMUSCULAR | Status: AC
  Filled 2012-03-14: qty 1

## 2012-03-14 MED ORDER — MIDAZOLAM HCL 5 MG/5ML IJ SOLN
INTRAMUSCULAR | Status: DC | PRN
  Administered 2012-03-14: 2 mg via INTRAVENOUS

## 2012-03-14 MED ORDER — KCL IN DEXTROSE-NACL 20-5-0.45 MEQ/L-%-% IV SOLN
INTRAVENOUS | Status: DC
  Administered 2012-03-14: 1000 mL via INTRAVENOUS
  Filled 2012-03-14: qty 1000

## 2012-03-14 MED ORDER — HYDROCODONE-ACETAMINOPHEN 10-325 MG PO TABS
1.0000 | ORAL_TABLET | ORAL | Status: DC | PRN
  Administered 2012-03-14 – 2012-03-15 (×3): 2 via ORAL
  Filled 2012-03-14 (×3): qty 2

## 2012-03-14 MED ORDER — ROCURONIUM BROMIDE 100 MG/10ML IV SOLN
INTRAVENOUS | Status: DC | PRN
  Administered 2012-03-14: 50 mg via INTRAVENOUS
  Administered 2012-03-14: 15 mg via INTRAVENOUS
  Administered 2012-03-14: 10 mg via INTRAVENOUS

## 2012-03-14 MED ORDER — HYDROMORPHONE HCL PF 1 MG/ML IJ SOLN
1.0000 mg | INTRAMUSCULAR | Status: DC | PRN
  Administered 2012-03-14: 2 mg via INTRAVENOUS
  Filled 2012-03-14: qty 2

## 2012-03-14 MED ORDER — SUFENTANIL CITRATE 50 MCG/ML IV SOLN
INTRAVENOUS | Status: DC | PRN
  Administered 2012-03-14 (×4): 10 ug via INTRAVENOUS
  Administered 2012-03-14 (×2): 5 ug via INTRAVENOUS

## 2012-03-14 MED ORDER — HYDROMORPHONE HCL PF 1 MG/ML IJ SOLN
INTRAMUSCULAR | Status: AC
  Filled 2012-03-14: qty 1

## 2012-03-14 MED ORDER — BUPIVACAINE LIPOSOME 1.3 % IJ SUSP
20.0000 mL | Freq: Once | INTRAMUSCULAR | Status: AC
  Administered 2012-03-14: 20 mL
  Filled 2012-03-14: qty 20

## 2012-03-14 MED ORDER — ONDANSETRON HCL 4 MG/2ML IJ SOLN
INTRAMUSCULAR | Status: DC | PRN
  Administered 2012-03-14: 4 mg via INTRAVENOUS

## 2012-03-14 MED ORDER — HYDROCHLOROTHIAZIDE 25 MG PO TABS
25.0000 mg | ORAL_TABLET | Freq: Every day | ORAL | Status: DC
  Administered 2012-03-16 – 2012-03-18 (×3): 25 mg via ORAL
  Filled 2012-03-14 (×6): qty 1

## 2012-03-14 MED ORDER — CYCLOBENZAPRINE HCL 10 MG PO TABS
10.0000 mg | ORAL_TABLET | Freq: Three times a day (TID) | ORAL | Status: DC
  Administered 2012-03-14 – 2012-03-19 (×15): 10 mg via ORAL
  Filled 2012-03-14 (×17): qty 1

## 2012-03-14 MED ORDER — LACTATED RINGERS IR SOLN
Status: DC | PRN
  Administered 2012-03-14: 1000 mL

## 2012-03-14 MED ORDER — ATORVASTATIN CALCIUM 40 MG PO TABS
40.0000 mg | ORAL_TABLET | Freq: Every evening | ORAL | Status: DC
  Administered 2012-03-15 – 2012-03-18 (×4): 40 mg via ORAL
  Filled 2012-03-14 (×6): qty 1

## 2012-03-14 MED ORDER — CITALOPRAM HYDROBROMIDE 40 MG PO TABS
40.0000 mg | ORAL_TABLET | Freq: Every day | ORAL | Status: DC
  Administered 2012-03-15 – 2012-03-19 (×5): 40 mg via ORAL
  Filled 2012-03-14 (×7): qty 1

## 2012-03-14 MED ORDER — MIDAZOLAM HCL 2 MG/2ML IJ SOLN
1.2500 mg | INTRAMUSCULAR | Status: DC | PRN
  Administered 2012-03-14: 1.3 mg via INTRAVENOUS

## 2012-03-14 MED ORDER — TRAZODONE HCL 100 MG PO TABS
200.0000 mg | ORAL_TABLET | Freq: Every day | ORAL | Status: DC
  Administered 2012-03-14 – 2012-03-18 (×5): 200 mg via ORAL
  Filled 2012-03-14 (×6): qty 2

## 2012-03-14 MED ORDER — HYDROMORPHONE HCL PF 1 MG/ML IJ SOLN
1.0000 mg | INTRAMUSCULAR | Status: DC | PRN
  Administered 2012-03-14: 2 mg via INTRAVENOUS
  Administered 2012-03-14: 1 mg via INTRAVENOUS
  Administered 2012-03-14: 2 mg via INTRAVENOUS
  Administered 2012-03-14: 1 mg via INTRAVENOUS
  Administered 2012-03-14 – 2012-03-15 (×6): 2 mg via INTRAVENOUS
  Filled 2012-03-14 (×10): qty 2

## 2012-03-14 MED ORDER — PANTOPRAZOLE SODIUM 40 MG PO TBEC
40.0000 mg | DELAYED_RELEASE_TABLET | Freq: Two times a day (BID) | ORAL | Status: DC
  Administered 2012-03-14 – 2012-03-19 (×10): 40 mg via ORAL
  Filled 2012-03-14 (×13): qty 1

## 2012-03-14 MED ORDER — ATENOLOL 50 MG PO TABS
50.0000 mg | ORAL_TABLET | Freq: Every day | ORAL | Status: DC
  Administered 2012-03-15 – 2012-03-19 (×5): 50 mg via ORAL
  Filled 2012-03-14 (×7): qty 1

## 2012-03-14 MED ORDER — ENOXAPARIN SODIUM 40 MG/0.4ML ~~LOC~~ SOLN
40.0000 mg | SUBCUTANEOUS | Status: DC
  Administered 2012-03-15 – 2012-03-19 (×5): 40 mg via SUBCUTANEOUS
  Filled 2012-03-14 (×7): qty 0.4

## 2012-03-14 MED ORDER — LISINOPRIL-HYDROCHLOROTHIAZIDE 20-25 MG PO TABS: 1.0000 | ORAL_TABLET | Freq: Every day | ORAL | Status: DC

## 2012-03-14 MED ORDER — LISINOPRIL 20 MG PO TABS
20.0000 mg | ORAL_TABLET | Freq: Every day | ORAL | Status: DC
  Administered 2012-03-16 – 2012-03-18 (×3): 20 mg via ORAL
  Filled 2012-03-14 (×6): qty 1

## 2012-03-14 MED ORDER — KCL IN DEXTROSE-NACL 20-5-0.45 MEQ/L-%-% IV SOLN
INTRAVENOUS | Status: DC
  Administered 2012-03-14: 23:00:00 via INTRAVENOUS
  Filled 2012-03-14 (×3): qty 1000

## 2012-03-14 MED ORDER — NEOSTIGMINE METHYLSULFATE 1 MG/ML IJ SOLN
INTRAMUSCULAR | Status: DC | PRN
  Administered 2012-03-14: 4 mg via INTRAVENOUS

## 2012-03-14 MED ORDER — HYDROMORPHONE HCL PF 1 MG/ML IJ SOLN
0.2500 mg | INTRAMUSCULAR | Status: DC | PRN
  Administered 2012-03-14 (×3): 0.25 mg via INTRAVENOUS
  Administered 2012-03-14: 0.5 mg via INTRAVENOUS
  Administered 2012-03-14: 0.25 mg via INTRAVENOUS
  Administered 2012-03-14: 0.5 mg via INTRAVENOUS

## 2012-03-14 MED ORDER — LACTATED RINGERS IV SOLN
INTRAVENOUS | Status: DC | PRN
  Administered 2012-03-14 (×2): via INTRAVENOUS

## 2012-03-14 MED ORDER — FENOFIBRATE 160 MG PO TABS
160.0000 mg | ORAL_TABLET | Freq: Every day | ORAL | Status: DC
  Administered 2012-03-16 – 2012-03-19 (×4): 160 mg via ORAL
  Filled 2012-03-14 (×6): qty 1

## 2012-03-14 MED ORDER — FENTANYL CITRATE 0.05 MG/ML IJ SOLN
25.0000 ug | INTRAMUSCULAR | Status: DC | PRN
  Administered 2012-03-14 (×4): 25 ug via INTRAVENOUS

## 2012-03-14 MED ORDER — DEXAMETHASONE SODIUM PHOSPHATE 10 MG/ML IJ SOLN
INTRAMUSCULAR | Status: DC | PRN
  Administered 2012-03-14: 10 mg via INTRAVENOUS

## 2012-03-14 MED ORDER — GLYCOPYRROLATE 0.2 MG/ML IJ SOLN
INTRAMUSCULAR | Status: DC | PRN
  Administered 2012-03-14: 0.6 mg via INTRAVENOUS

## 2012-03-14 MED ORDER — KCL IN DEXTROSE-NACL 20-5-0.45 MEQ/L-%-% IV SOLN
INTRAVENOUS | Status: AC
  Filled 2012-03-14: qty 1000

## 2012-03-14 MED ORDER — PROMETHAZINE HCL 25 MG/ML IJ SOLN: 6.2500 mg | INTRAMUSCULAR | Status: DC | PRN

## 2012-03-14 MED ORDER — CEFAZOLIN SODIUM-DEXTROSE 2-3 GM-% IV SOLR
INTRAVENOUS | Status: AC
  Filled 2012-03-14: qty 50

## 2012-03-14 MED ORDER — PROPOFOL 10 MG/ML IV BOLUS
INTRAVENOUS | Status: DC | PRN
  Administered 2012-03-14: 200 mg via INTRAVENOUS

## 2012-03-14 MED ORDER — ALPRAZOLAM 1 MG PO TABS
1.0000 mg | ORAL_TABLET | Freq: Three times a day (TID) | ORAL | Status: DC
  Administered 2012-03-14 – 2012-03-19 (×15): 1 mg via ORAL
  Filled 2012-03-14 (×15): qty 1

## 2012-03-14 MED ORDER — LORAZEPAM 2 MG/ML IJ SOLN
1.0000 mg | Freq: Once | INTRAMUSCULAR | Status: AC
  Administered 2012-03-14: 1 mg via INTRAVENOUS
  Filled 2012-03-14: qty 1

## 2012-03-14 MED ORDER — DOCUSATE SODIUM 100 MG PO CAPS
100.0000 mg | ORAL_CAPSULE | Freq: Every day | ORAL | Status: DC
  Administered 2012-03-15 – 2012-03-18 (×4): 100 mg via ORAL
  Filled 2012-03-14 (×6): qty 1

## 2012-03-14 MED ORDER — CEFAZOLIN SODIUM-DEXTROSE 2-3 GM-% IV SOLR
2.0000 g | INTRAVENOUS | Status: AC
  Administered 2012-03-14: 2 g via INTRAVENOUS

## 2012-03-14 MED ORDER — FENTANYL CITRATE 0.05 MG/ML IJ SOLN
INTRAMUSCULAR | Status: AC
  Filled 2012-03-14: qty 2

## 2012-03-14 MED ORDER — METOCLOPRAMIDE HCL 5 MG/ML IJ SOLN
INTRAMUSCULAR | Status: DC | PRN
  Administered 2012-03-14: 10 mg via INTRAVENOUS

## 2012-03-14 MED ORDER — ONDANSETRON HCL 4 MG PO TABS
4.0000 mg | ORAL_TABLET | Freq: Four times a day (QID) | ORAL | Status: DC | PRN
  Administered 2012-03-15 – 2012-03-19 (×4): 4 mg via ORAL
  Filled 2012-03-14 (×6): qty 1

## 2012-03-14 MED ORDER — CEFAZOLIN SODIUM-DEXTROSE 2-3 GM-% IV SOLR
2.0000 g | Freq: Four times a day (QID) | INTRAVENOUS | Status: AC
  Administered 2012-03-14 – 2012-03-15 (×3): 2 g via INTRAVENOUS
  Filled 2012-03-14 (×3): qty 50

## 2012-03-14 MED ORDER — 0.9 % SODIUM CHLORIDE (POUR BTL) OPTIME
TOPICAL | Status: DC | PRN
  Administered 2012-03-14: 1000 mL

## 2012-03-14 MED ORDER — ONDANSETRON HCL 4 MG/2ML IJ SOLN
4.0000 mg | Freq: Four times a day (QID) | INTRAMUSCULAR | Status: DC | PRN
  Administered 2012-03-14 – 2012-03-17 (×5): 4 mg via INTRAVENOUS
  Filled 2012-03-14 (×6): qty 2

## 2012-03-14 SURGICAL SUPPLY — 39 items
APPLIER CLIP 5 13 M/L LIGAMAX5 (MISCELLANEOUS) ×2
APPLIER CLIP ROT 10 11.4 M/L (STAPLE)
BENZOIN TINCTURE PRP APPL 2/3 (GAUZE/BANDAGES/DRESSINGS) IMPLANT
CANISTER SUCTION 2500CC (MISCELLANEOUS) ×2 IMPLANT
CLIP APPLIE 5 13 M/L LIGAMAX5 (MISCELLANEOUS) ×1 IMPLANT
CLIP APPLIE ROT 10 11.4 M/L (STAPLE) IMPLANT
CLOTH BEACON ORANGE TIMEOUT ST (SAFETY) ×2 IMPLANT
COVER MAYO STAND STRL (DRAPES) IMPLANT
DECANTER SPIKE VIAL GLASS SM (MISCELLANEOUS) IMPLANT
DERMABOND ADVANCED (GAUZE/BANDAGES/DRESSINGS) ×1
DERMABOND ADVANCED .7 DNX12 (GAUZE/BANDAGES/DRESSINGS) ×1 IMPLANT
DRAPE C-ARM 42X72 X-RAY (DRAPES) IMPLANT
DRAPE LAPAROSCOPIC ABDOMINAL (DRAPES) ×2 IMPLANT
ELECT REM PT RETURN 9FT ADLT (ELECTROSURGICAL) ×2
ELECTRODE REM PT RTRN 9FT ADLT (ELECTROSURGICAL) ×1 IMPLANT
GLOVE BIO SURGEON STRL SZ7 (GLOVE) ×2 IMPLANT
GLOVE BIOGEL PI IND STRL 7.5 (GLOVE) ×1 IMPLANT
GLOVE BIOGEL PI INDICATOR 7.5 (GLOVE) ×1
GOWN PREVENTION PLUS LG XLONG (DISPOSABLE) ×2 IMPLANT
GOWN PREVENTION PLUS XLARGE (GOWN DISPOSABLE) ×2 IMPLANT
GOWN STRL NON-REIN LRG LVL3 (GOWN DISPOSABLE) ×4 IMPLANT
GOWN STRL REIN XL XLG (GOWN DISPOSABLE) IMPLANT
KIT BASIN OR (CUSTOM PROCEDURE TRAY) ×2 IMPLANT
NS IRRIG 1000ML POUR BTL (IV SOLUTION) ×2 IMPLANT
POUCH SPECIMEN RETRIEVAL 10MM (ENDOMECHANICALS) ×4 IMPLANT
SCALPEL HARMONIC ACE (MISCELLANEOUS) ×2 IMPLANT
SET CHOLANGIOGRAPH MIX (MISCELLANEOUS) IMPLANT
SET IRRIG TUBING LAPAROSCOPIC (IRRIGATION / IRRIGATOR) ×2 IMPLANT
SOLUTION ANTI FOG 6CC (MISCELLANEOUS) ×2 IMPLANT
STRIP CLOSURE SKIN 1/2X4 (GAUZE/BANDAGES/DRESSINGS) IMPLANT
SUT MNCRL AB 4-0 PS2 18 (SUTURE) ×2 IMPLANT
SYR 50ML LL SCALE MARK (SYRINGE) ×2 IMPLANT
TOWEL OR 17X26 10 PK STRL BLUE (TOWEL DISPOSABLE) ×2 IMPLANT
TRAP SPECIMEN MUCOUS 40CC (MISCELLANEOUS) ×2 IMPLANT
TRAY LAP CHOLE (CUSTOM PROCEDURE TRAY) ×2 IMPLANT
TROCAR BLADELESS OPT 5 75 (ENDOMECHANICALS) ×6 IMPLANT
TROCAR XCEL BLUNT TIP 100MML (ENDOMECHANICALS) ×2 IMPLANT
TROCAR XCEL NON-BLD 11X100MML (ENDOMECHANICALS) ×2 IMPLANT
TUBING INSUFFLATION 10FT LAP (TUBING) ×2 IMPLANT

## 2012-03-14 NOTE — Transfer of Care (Signed)
Immediate Anesthesia Transfer of Care Note  Patient: [name]  Procedure(s) Performed: Procedure(s) (LRB): LAPAROSCOPIC LIVER CYST UNROOFING (N/A) LAPAROSCOPIC CHOLECYSTECTOMY (N/A)  Patient Location: PACU  Anesthesia Type: General  Level of Consciousness: awake, oriented and patient cooperative  Airway & Oxygen Therapy: Patient Spontanous Breathing and Patient connected to face mask oxygen  Post-op Assessment: Report given to PACU RN, Post -op Vital signs reviewed and stable and Patient moving all extremities  Post vital signs: Reviewed and stable  Complications: No apparent anesthesia complications

## 2012-03-14 NOTE — H&P (Signed)
Chief Complaint   Patient presents with   .  New Evaluation       Liver lesion    HISTORY: Patient is a 51 year old female who presented to Dr. Christella Hartigan with complaints of nausea and vomiting. She also complains of upper abdominal pain. She thinks the pain is worse with activity. She denies any history of liver disease of which she is aware. She does not have any family history of liver disease. She also has had a positive Hemoccult test and chronic constipation. EGD demonstrated gastritis and H Pylori   Of note she has had 3 cervical spine fusions and continues to have pain. She does take hydrocodone on a frequent basis for this. She quit smoking a month ago.    Past Medical History   Diagnosis  Date   .  Liver lesion     .  Anxiety     .  Hypertension     .  Breast changes, fibrocystic     .  Idiopathic thrombocytopenia         as child   .  Arthritis     .  Clotting disorder     .  GERD (gastroesophageal reflux disease)     .  Hyperlipidemia     .  Neuromuscular disorder         back   .  Osteoporosis     .  Nasal congestion     .  Trouble swallowing     .  Leg swelling     .  Abdominal pain     .  Rectal bleeding     .  Blood in stool     .  Unintentional weight loss     .  Constipation     .  Nausea & vomiting     .  Generalized headaches     .  Weakness     .  Bruises easily       Past Surgical History   Procedure  Date   .  Cervical spine surgery  2005, 2007, 2010       x3   .  Cesarean section  1988, 1994   .  Abdominal hysterectomy  1997       Current Outpatient Prescriptions   Medication  Sig  Dispense  Refill   .  atorvastatin (LIPITOR) 40 MG tablet  Daily.         Marland Kitchen  ALPRAZolam (XANAX) 1 MG tablet  Take 1 mg by mouth 3 (three) times daily as needed.         Marland Kitchen  atenolol (TENORMIN) 50 MG tablet  Take 50 mg by mouth daily.         .  citalopram (CELEXA) 40 MG tablet  Take 40 mg by mouth daily.         .  cyclobenzaprine (FLEXERIL) 10 MG tablet  Take 15  mg by mouth as needed.         .  docusate sodium (COLACE) 100 MG capsule  Take 100 mg by mouth at bedtime.         .  fenofibrate micronized (ANTARA) 130 MG capsule  Take 130 mg by mouth daily before breakfast.         .  hydrochlorothiazide (HYDRODIURIL) 25 MG tablet  Take 25 mg by mouth daily.         Marland Kitchen  HYDROcodone-acetaminophen (NORCO) 10-325 MG per tablet  Take 1 tablet by mouth  every 6 (six) hours as needed.         Marland Kitchen  lisinopril-hydrochlorothiazide (PRINZIDE,ZESTORETIC) 20-25 MG per tablet  Take 1 tablet by mouth daily.         .  pantoprazole (PROTONIX) 40 MG tablet  Take 40 mg by mouth daily.         .  traZODone (DESYREL) 100 MG tablet  Take 100 mg by mouth at bedtime.            Allergies   Allergen  Reactions   .  Nsaids  Nausea Only       Abdominal pain        Family History   Problem  Relation  Age of Onset   .  Heart disease  Maternal Grandmother     .  Hypertension  Mother     .  Heart disease  Mother     .  Diabetes  Sister     .  Lung cancer  Father     .  Cancer  Father         lung   .  Colon cancer  Paternal Grandmother     .  Cancer  Paternal Grandmother         colon    History       Social History   .  Marital Status:  Divorced       Spouse Name:  N/A       Number of Children:  3   .  Years of Education:  N/A       Occupational History   .  Diabled         Social History Main Topics   .  Smoking status:  Former Smoker       Quit date:  12/29/2011   .  Smokeless tobacco:  Never Used   .  Alcohol Use:  No   .  Drug Use:  No   .  Sexually Active:  None       Other Topics  Concern   .  None       Social History Narrative   .  None      REVIEW OF SYSTEMS - PERTINENT POSITIVES ONLY: 12 point review of systems negative other than HPI and PMH except for weight fluctuance, congestion, joint pain, headaches, weakness, and easy bruising.   EXAM: Filed Vitals:     01/26/12 1111   BP:  128/74   Pulse:  68   Temp:  97.9 F (36.6 C)     Resp:  18    Gen:  No acute distress.  Well nourished and well groomed.   Neurological: Alert and oriented to person, place, and time. Slightly antalgic gait.  Head: Normocephalic and atraumatic.   Eyes: Conjunctivae are normal. Pupils are equal, round, and reactive to light. No scleral icterus.  Neck: Normal range of motion. Neck supple. No tracheal deviation or thyromegaly present.  Cardiovascular: Normal rate, regular rhythm, normal heart sounds and intact distal pulses.  Exam reveals no gallop and no friction rub.  No murmur heard. Respiratory: Effort normal.  No respiratory distress. No chest wall tenderness. Breath sounds normal.  No wheezes, rales or rhonchi.   GI: Soft. Bowel sounds are normal. The abdomen is soft. There is tenderness in the RUQ.   There is no rebound and no guarding. No hepatosplenomegaly. Musculoskeletal: Normal range of motion. Extremities are nontender.  Lymphadenopathy: No cervical, preauricular, postauricular or axillary  adenopathy is present Skin: Skin is warm and dry. No rash noted. No diaphoresis. No erythema. No pallor. No clubbing, cyanosis, or edema.   Psychiatric: Normal mood and affect. Behavior is normal. Judgment and thought content normal.      RADIOLOGY RESULTS: See E-Chart or I-Site for most recent results.  Images and reports are reviewed. MR abdomen   IMPRESSION:   1. Lobulated, multi septated cystic structure within the right   hepatic lobe. Differential includes biliary cystadenoma or   cystadenocarcinoma. Differential considerations include simple   liver cyst. However, the presence of internal area of septation   with enhancement favors biliary cystadenoma.   2. Remaining portions of the examination are essentially normal    ASSESSMENT AND PLAN: Cholecystitis chronic Given right upper quadrant pain, nausea, and vomiting, I would plan to take out the gallbladder at the time of surgery. The gallbladder is very close to the liver  lesion, certainly from a standpoint of surgical resection it would certainly facilitate removal of the mass. I think it may actually correct her problems. Cystic liver mass This lesion appears benign on imaging. I would sill resect it given the septations that are present. She is very concerned about this. This type of  lesion is not amenable to percutaneous biopsy. We will schedule removal of this and the gallbladder after receiving her EGD and colonoscopy report.     Maudry Diego MD Surgical Oncology, General and Endocrine Surgery [facility]    Visit Diagnoses: 1.  Cystic liver mass    2.  Cholecystitis chronic      Primary Care Physician: Bennie Pierini, FNP, FNP

## 2012-03-14 NOTE — Anesthesia Postprocedure Evaluation (Signed)
  Anesthesia Post-op Note  Patient: [name]  Procedure(s) Performed: Procedure(s) (LRB): LAPAROSCOPIC LIVER CYST UNROOFING (N/A) LAPAROSCOPIC CHOLECYSTECTOMY (N/A)  Patient Location: PACU  Anesthesia Type: General  Level of Consciousness: awake and alert   Airway and Oxygen Therapy: Patient Spontanous Breathing  Post-op Pain: mild  Post-op Assessment: Post-op Vital signs reviewed, Patient's Cardiovascular Status Stable, Respiratory Function Stable, Patent Airway and No signs of Nausea or vomiting  Post-op Vital Signs: stable  Complications: No apparent anesthesia complications. CXR reviewed. Continues to deny SOB, dyspnea. Oxygen sats 96% on nasal cannula. Incentive spirometer has been ordered. Doing better after a dose of Versed (She felt a panic attack coming on).

## 2012-03-14 NOTE — Discharge Instructions (Signed)
[facility] Office Phone Number [phone]   POST OP INSTRUCTIONS  Always review your discharge instruction sheet given to you by the facility where your surgery was performed.  IF YOU HAVE DISABILITY OR FAMILY LEAVE FORMS, YOU MUST BRING THEM TO THE OFFICE FOR PROCESSING.  DO NOT GIVE THEM TO YOUR DOCTOR.  1. A prescription for pain medication may be given to you upon discharge.  Take your pain medication as prescribed, if needed.  If narcotic pain medicine is not needed, then you may take acetaminophen (Tylenol) or ibuprofen (Advil) as needed. 2. Take your usually prescribed medications unless otherwise directed 3. If you need a refill on your pain medication, please contact your pharmacy.  They will contact our office to request authorization.  Prescriptions will not be filled after 5pm or on week-ends. 4. You should eat very light the first 24 hours after surgery, such as soup, crackers, pudding, etc.  Resume your normal diet the day after surgery 5. It is common to experience some constipation if taking pain medication after surgery.  Increasing fluid intake and taking a stool softener will usually help or prevent this problem from occurring.  A mild laxative (Milk of Magnesia or Miralax) should be taken according to package directions if there are no bowel movements after 48 hours. 6. You may shower in 48 hours.  The surgical glue will flake off in 2-3 weeks.   7. ACTIVITIES:  No strenuous activity or heavy lifting for 2 week.   a. You may drive when you no longer are taking prescription pain medication, you can comfortably wear a seatbelt, and you can safely maneuver your car and apply brakes. b. RETURN TO WORK:  _________1-4 weeks______________ [name] should see your doctor in the office for a follow-up appointment approximately three-four weeks after your surgery.    WHEN TO CALL YOUR DOCTOR: 1. Fever over 101.0 2. Nausea and/or vomiting. 3. Extreme swelling or  bruising. 4. Continued bleeding from incision. 5. Increased pain, redness, or drainage from the incision.  The clinic staff is available to answer your questions during regular business hours.  Please don't hesitate to call and ask to speak to one of the nurses for clinical concerns.  If you have a medical emergency, go to the nearest emergency room or call 911.  A surgeon from [facility] is always on call at the hospital.  For further questions, please visit [email or web address]

## 2012-03-14 NOTE — Op Note (Signed)
Laparoscopic liver cyst unroofing and Laparoscopic Cholecystectomy   Indications: This patient presents with chronic right upper quadrant abdominal pain and nausea and will undergo laparoscopic cholecystectomy and cyst unroofing.  Pre-operative Diagnosis: chronic cholecystitis and septated liver cyst  Post-operative Diagnosis: Same  Surgeon: [facility]   Assistants: None  Anesthesia: General endotracheal anesthesia and Exparel  ASA Class: 2  Procedure Details  The patient was seen again in the Holding Room. The risks, benefits, complications, treatment options, and expected outcomes were discussed with the patient. The possibilities of  bleeding, recurrent infection, damage to nearby structures, the need for additional procedures, failure to diagnose a condition, the possible need to convert to an open procedure, and creating a complication requiring transfusion or operation were discussed with the patient. The likelihood of improving the patient's symptoms with return to their baseline status is good.    The patient and/or family concurred with the proposed plan, giving informed consent. The site of surgery properly noted. The patient was taken to Operating Room, and the procedure verified. A Time Out was held and the above information confirmed.  Prior to the induction of general anesthesia, antibiotic prophylaxis was administered. General endotracheal anesthesia was then administered and tolerated well. After the induction, the abdomen was prepped with Chloraprep and draped in the sterile fashion. The patient was positioned in the supine position, bumped on a bean bag.  Local anesthetic agent was injected into the skin near the umbilicus and an incision made. We dissected down to the abdominal fascia with blunt dissection.  The fascia was incised vertically and we entered the peritoneal cavity bluntly.  A pursestring suture of 0-Vicryl was placed around the fascial opening.  The Hasson  cannula was inserted and secured with the stay suture.  Pneumoperitoneum was then created with CO2 and tolerated well without any adverse changes in the patient's vital signs. An 11-mm port was placed in the subxiphoid position.  Two 5-mm ports were placed in the right upper quadrant. All skin incisions were infiltrated with a local anesthetic agent before making the incision and placing the trocars.   We positioned the patient in reverse Trendelenburg, tilted slightly to the patient's left.  The gallbladder was identified, the fundus grasped and retracted cephalad. Adhesions were lysed bluntly and with the electrocautery where indicated, taking care not to injure any adjacent organs or viscus. The infundibulum was grasped and retracted laterally, exposing the peritoneum overlying the triangle of Calot. This was then divided and exposed in a blunt fashion. A critical view of the cystic duct and cystic artery was obtained.  The cystic duct was clearly identified and bluntly dissected circumferentially. A critical view was obtained, demonstrating clear anatomy medially and laterally.   The cystic duct was then triply ligated with clips and divided. The cystic artery was identified, dissected free, ligated with clips and divided as well. A diminutive branch of the artery was identified and clipped.    The gallbladder was dissected from the liver bed in retrograde fashion with the electrocautery. The gallbladder was removed and placed in an Endocatch bag.  The gallbladder and Endocatch bag were then removed through the umbilical port site.  The liver bed was irrigated and inspected. Hemostasis was achieved with the electrocautery. Copious irrigation was utilized and was repeatedly aspirated until clear.    A third 5 mm port was placed in the lateral right subcostal margin.  The angled scope was used to identify the cyst.  The cyst fluid was aspirated and sent for  cytology.  It appeared completely benign.  The  cyst wall was taken off the top with the harmonic scalpel.  This was placed in a endocatch bag and sent for pathology.  Hemostasis was achieved with cautery.    We again inspected the right upper quadrant for hemostasis.  Pneumoperitoneum was released as we removed the trocars.   The pursestring suture was used to close the umbilical fascia.  4-0 Monocryl was used to close the skin.   The skin was cleaned and dry, and Dermabond was applied. The patient was then extubated and brought to the recovery room in stable condition. Instrument, sponge, and needle counts were correct at closure and at the conclusion of the case.   Findings: Chronic cholecystitis, with the infundibulum of the gallbladder adherent to the duodenum.  Also, omental adhesions were present.  The cyst appeared completely benign.    Estimated Blood Loss: Minimal         Specimens: Gallbladder, cyst fluid, and cyst wall.            Complications: None; patient tolerated the procedure well.         Disposition: PACU - hemodynamically stable.         Condition: stable

## 2012-03-15 ENCOUNTER — Encounter (HOSPITAL_COMMUNITY): Payer: Self-pay | Admitting: General Surgery

## 2012-03-15 LAB — COMPREHENSIVE METABOLIC PANEL
ALT: 278 U/L — ABNORMAL HIGH (ref 0–35)
Alkaline Phosphatase: 86 U/L (ref 39–117)
CO2: 24 mEq/L (ref 19–32)
GFR calc Af Amer: >90 mL/min (ref >90)
GFR calc non Af Amer: >90 mL/min (ref >90)
Glucose, Bld: 145 mg/dL — ABNORMAL HIGH (ref 70–99)
Potassium: 3.7 mEq/L (ref 3.5–5.1)
Sodium: 134 mEq/L — ABNORMAL LOW (ref 135–145)
Total Bilirubin: 0.3 mg/dL (ref 0.3–1.2)

## 2012-03-15 LAB — CBC
MCHC: 32.4 g/dL (ref 30.0–36.0)
Platelets: 226 10*3/uL (ref 150–400)
RDW: 13.2 % (ref 11.5–15.5)

## 2012-03-15 MED ORDER — OXYCODONE HCL 5 MG PO TABS
5.0000 mg | ORAL_TABLET | ORAL | Status: DC | PRN
  Administered 2012-03-15: 20 mg via ORAL
  Administered 2012-03-15: 10 mg via ORAL
  Filled 2012-03-15: qty 4
  Filled 2012-03-15: qty 2

## 2012-03-15 MED ORDER — HYDROMORPHONE HCL PF 2 MG/ML IJ SOLN
2.0000 mg | INTRAMUSCULAR | Status: DC | PRN
  Administered 2012-03-15: 4 mg via INTRAVENOUS
  Filled 2012-03-15: qty 2

## 2012-03-15 MED ORDER — HYDROMORPHONE HCL 2 MG PO TABS
2.0000 mg | ORAL_TABLET | ORAL | Status: DC | PRN
  Administered 2012-03-15 – 2012-03-17 (×11): 4 mg via ORAL
  Administered 2012-03-17 – 2012-03-18 (×2): 2 mg via ORAL
  Administered 2012-03-18: 4 mg via ORAL
  Administered 2012-03-18: 2 mg via ORAL
  Administered 2012-03-18: 4 mg via ORAL
  Administered 2012-03-18: 2 mg via ORAL
  Administered 2012-03-18: 4 mg via ORAL
  Administered 2012-03-19: 2 mg via ORAL
  Filled 2012-03-15: qty 2
  Filled 2012-03-15: qty 1
  Filled 2012-03-15 (×7): qty 2
  Filled 2012-03-15 (×3): qty 1
  Filled 2012-03-15 (×3): qty 2
  Filled 2012-03-15: qty 1
  Filled 2012-03-15 (×3): qty 2

## 2012-03-15 MED ORDER — LORAZEPAM 1 MG PO TABS
1.0000 mg | ORAL_TABLET | Freq: Once | ORAL | Status: AC
  Administered 2012-03-15: 1 mg via ORAL
  Filled 2012-03-15: qty 1

## 2012-03-15 MED ORDER — SUMATRIPTAN SUCCINATE 100 MG PO TABS
100.0000 mg | ORAL_TABLET | ORAL | Status: DC | PRN
  Administered 2012-03-15 (×2): 100 mg via ORAL
  Filled 2012-03-15 (×3): qty 1

## 2012-03-15 NOTE — Progress Notes (Signed)
Dr. Donell Beers notified patient complaining Dilaudid 2mg  every 2 hrs, Oxycodone IR 20mg s, and Immitrex 100mg s every 2hrs not controlling pain. New order: to D/c oxycodone, Dilaudid 2-4mg s IV q4hrs PRN for pain and 1mg  ativan po now.

## 2012-03-15 NOTE — Progress Notes (Signed)
1 Day Post-Op  Subjective: Pt having pain issues.  Had breakthrough from 2x10 mg Norco.  Requiring IV dilaudid.  Her IV came disconnected today, and she lost a significant amount of blood from her right hand.  She is also requiring some oxygen.    Objective: Vital signs in last 24 hours: Temp:  [97.5 F (36.4 C)-99 F (37.2 C)] 98.2 F (36.8 C) (05/07 0938) Pulse Rate:  [86-103] 86  (05/07 0938) Resp:  [17-20] 19  (05/07 0938) BP: (84-145)/(71-85) 105/72 mmHg (05/07 0938) SpO2:  [80 %-100 %] 89 % (05/07 0938)    Intake/Output from previous day: 05/06 0701 - 05/07 0700 In: 4333.8 [P.O.:1260; I.V.:2923.8; IV Piggyback:150] Out: 3860 [Urine:3810; Blood:50] Intake/Output this shift: Total I/O In: -  Out: 450 [Urine:450]  General appearance: alert, cooperative and mild distress GI: soft, slightly distended, approp tender at incisions.   Incision/Wound:  Lab Results:   Basename 03/15/12 0430 03/14/12 1508  WBC 18.2* 15.1*  HGB 11.6* 13.2  HCT 35.8* 39.9  PLT 226 269   BMET  Basename 03/15/12 0430 03/14/12 1508  NA 134* 134*  K 3.7 3.7  CL 97 97  CO2 24 26  GLUCOSE 145* 154*  BUN 9 17  CREATININE 0.70 0.64  CALCIUM 8.8 8.9   PT/INR No results found for this basename: LABPROT:2,INR:2 in the last 72 hours ABG No results found for this basename: PHART:2,PCO2:2,PO2:2,HCO3:2 in the last 72 hours  Studies/Results: Dg Chest Port 1 View  03/14/2012  *RADIOLOGY REPORT*  Clinical Data: Postop hypoxia  PORTABLE CHEST - 1 VIEW  Comparison: 03/08/2012  Findings: Borderline cardiomegaly noted.  Central mild vascular congestion and minimal perihilar interstitial prominence suspicious for fluid overload or mild interstitial edema.  No focal infiltrate.  Mild elevation of the right hemidiaphragm.  Left basilar atelectasis.  IMPRESSION: Central mild vascular congestion and mild perihilar interstitial prominence suspicious for mild interstitial edema or fluid overload.  No focal  infiltrate.  Mild basilar atelectasis.  Original Report Authenticated By: Natasha Mead, M.D.    Anti-infectives: Anti-infectives     Start     Dose/Rate Route Frequency Ordered Stop   03/14/12 1400   ceFAZolin (ANCEF) IVPB 2 g/50 mL premix        2 g 100 mL/hr over 30 Minutes Intravenous Every 6 hours 03/14/12 1142 03/15/12 0205   03/14/12 0531   ceFAZolin (ANCEF) IVPB 2 g/50 mL premix        2 g 100 mL/hr over 30 Minutes Intravenous 60 min pre-op 03/14/12 0531 03/14/12 0735          Assessment/Plan: s/p Procedure(s) (LRB): LAPAROSCOPIC LIVER CYST UNROOFING (N/A) LAPAROSCOPIC CHOLECYSTECTOMY (N/A) Imitrex for Migraine Recheck labs for ABL anemia Recheck LFTs to make sure they are coming down. Saline lock IVF to make sure that she does not become fluid overloaded. Oxycodone for pain, if not effective, will try PO dilaudid. Hope for d/c tomorrow.    LOS: 1 day    [facility] 03/15/2012

## 2012-03-16 ENCOUNTER — Ambulatory Visit (HOSPITAL_COMMUNITY): Payer: Medicare Other

## 2012-03-16 LAB — URINALYSIS, ROUTINE W REFLEX MICROSCOPIC
Bilirubin Urine: NEGATIVE
Specific Gravity, Urine: 1.01 (ref 1.005–1.030)
Urobilinogen, UA: 0.2 mg/dL (ref 0.0–1.0)
pH: 7.5 (ref 5.0–8.0)

## 2012-03-16 LAB — CBC
HCT: 34.9 % — ABNORMAL LOW (ref 36.0–46.0)
MCV: 89.9 fL (ref 78.0–100.0)
RDW: 13.4 % (ref 11.5–15.5)
WBC: 16.1 10*3/uL — ABNORMAL HIGH (ref 4.0–10.5)

## 2012-03-16 LAB — URINE MICROSCOPIC-ADD ON

## 2012-03-16 LAB — COMPREHENSIVE METABOLIC PANEL
ALT: 228 U/L — ABNORMAL HIGH (ref 0–35)
AST: 117 U/L — ABNORMAL HIGH (ref 0–37)
Albumin: 3.1 g/dL — ABNORMAL LOW (ref 3.5–5.2)
Alkaline Phosphatase: 109 U/L (ref 39–117)
BUN: 8 mg/dL (ref 6–23)
Potassium: 3.6 mEq/L (ref 3.5–5.1)
Sodium: 138 mEq/L (ref 135–145)
Total Protein: 6.7 g/dL (ref 6.0–8.3)

## 2012-03-16 MED ORDER — HYDROMORPHONE HCL PF 1 MG/ML IJ SOLN
1.0000 mg | INTRAMUSCULAR | Status: DC | PRN
  Administered 2012-03-17: 2 mg via INTRAVENOUS
  Administered 2012-03-17 (×3): 1 mg via INTRAVENOUS
  Filled 2012-03-16 (×2): qty 1
  Filled 2012-03-16: qty 2

## 2012-03-16 MED ORDER — HYDROCODONE-ACETAMINOPHEN 10-325 MG PO TABS
1.0000 | ORAL_TABLET | Freq: Four times a day (QID) | ORAL | Status: DC
  Administered 2012-03-16 – 2012-03-19 (×10): 1 via ORAL
  Filled 2012-03-16 (×10): qty 1

## 2012-03-16 MED ORDER — KCL IN DEXTROSE-NACL 20-5-0.45 MEQ/L-%-% IV SOLN
INTRAVENOUS | Status: DC
  Administered 2012-03-16 (×2): via INTRAVENOUS
  Administered 2012-03-17: 100 mL via INTRAVENOUS
  Filled 2012-03-16 (×4): qty 1000

## 2012-03-16 MED ORDER — BISACODYL 10 MG RE SUPP: 10.0000 mg | Freq: Every day | RECTAL | Status: DC | PRN

## 2012-03-16 NOTE — Progress Notes (Signed)
Patient ID: [name], female   DOB: 1961/05/29, 51 y.o.   MRN: [id number] 2 Days Post-Op  Subjective: Still having pain issues.  Transitioned to PO and IV dilaudid.  Febrile last night.  No bowel movement yet.  No flatus, complains of abdominal distention.     Objective: Vital signs in last 24 hours: Temp:  [97.9 F (36.6 C)-101.1 F (38.4 C)] 98.5 F (36.9 C) (05/08 0400) Pulse Rate:  [86-111] 105  (05/08 0400) Resp:  [18-20] 20  (05/08 0400) BP: (105-143)/(71-90) 114/74 mmHg (05/08 0400) SpO2:  [89 %-97 %] 93 % (05/08 0400)    Intake/Output from previous day: 05/07 0701 - 05/08 0700 In: 260 [P.O.:260] Out: 2550 [Urine:2550] Intake/Output this shift: Total I/O In: 60 [P.O.:60] Out: 1700 [Urine:1700]  General appearance: alert, cooperative and mild distress GI: soft, slightly distended, approp tender at incisions.   Incision/Wound: c/d/i  Lab Results:   Basename 03/16/12 0438 03/15/12 0430  WBC 16.1* 18.2*  HGB 11.3* 11.6*  HCT 34.9* 35.8*  PLT 230 226   BMET  Basename 03/16/12 0438 03/15/12 0430  NA 138 134*  K 3.6 3.7  CL 98 97  CO2 33* 24  GLUCOSE 130* 145*  BUN 8 9  CREATININE 0.73 0.70  CALCIUM 9.6 8.8   PT/INR No results found for this basename: LABPROT:2,INR:2 in the last 72 hours ABG No results found for this basename: PHART:2,PCO2:2,PO2:2,HCO3:2 in the last 72 hours  Studies/Results: Dg Chest Port 1 View  03/14/2012  *RADIOLOGY REPORT*  Clinical Data: Postop hypoxia  PORTABLE CHEST - 1 VIEW  Comparison: 03/08/2012  Findings: Borderline cardiomegaly noted.  Central mild vascular congestion and minimal perihilar interstitial prominence suspicious for fluid overload or mild interstitial edema.  No focal infiltrate.  Mild elevation of the right hemidiaphragm.  Left basilar atelectasis.  IMPRESSION: Central mild vascular congestion and mild perihilar interstitial prominence suspicious for mild interstitial edema or fluid overload.  No focal  infiltrate.  Mild basilar atelectasis.  Original Report Authenticated By: Natasha Mead, M.D.    Anti-infectives: Anti-infectives     Start     Dose/Rate Route Frequency Ordered Stop   03/14/12 1400   ceFAZolin (ANCEF) IVPB 2 g/50 mL premix        2 g 100 mL/hr over 30 Minutes Intravenous Every 6 hours 03/14/12 1142 03/15/12 0205   03/14/12 0531   ceFAZolin (ANCEF) IVPB 2 g/50 mL premix        2 g 100 mL/hr over 30 Minutes Intravenous 60 min pre-op 03/14/12 0531 03/14/12 0735          Assessment/Plan: s/p Procedure(s) (LRB): LAPAROSCOPIC LIVER CYST UNROOFING (N/A) LAPAROSCOPIC CHOLECYSTECTOMY (N/A) Imitrex for Migraine ABL anemia - stable. Ileus - back off on diet.  Suppository Fever - check U/A and CXR, suspect atelectasis or effusion as culprit Dehydration (dark urine) - restart IVF   LOS: 2 days    [facility] 03/16/2012

## 2012-03-17 MED ORDER — HYDROMORPHONE HCL 2 MG PO TABS: 2.0000 mg | ORAL_TABLET | ORAL | Status: AC | PRN

## 2012-03-17 MED ORDER — HYDROCODONE-ACETAMINOPHEN 10-325 MG PO TABS: 1.0000 | ORAL_TABLET | Freq: Four times a day (QID) | ORAL | Status: DC | PRN

## 2012-03-17 NOTE — Progress Notes (Signed)
[facility] nurse called unit to report having seen pt on elevator. Pt not in room nor walking hall. Security notified. RN went downstairs to first floor lobby. Pt and sister outside at front entrance. Pt stated " I wanted to smoke but decided not to." Both escorted back to [address] [place] and to room. Advised of and reminded that hospital was tobacco-free and that smoking was not allowed per policy. Pt and sister also advised not to leave unit again unless an MD had approved privilege. No such order exists at this time. Encouraged pt to walk halls of unit only. Both pt and sister verbalized understanding and was very apologetic for leaving unit.

## 2012-03-17 NOTE — Progress Notes (Addendum)
Patient ID: [name], female   DOB: 01-11-1961, 51 y.o.   MRN: [id number] 3 Days Post-Op  Subjective: Pain doing better alternating hydrocodone with dilaudid orally.  Also now passing gas.  No more nausea on the full liquids yesterday.  Still requiring some IV narcotics.  Also still having some low oxygen sats.     Objective: Vital signs in last 24 hours: Temp:  [97.3 F (36.3 C)-98.7 F (37.1 C)] 97.8 F (36.6 C) (05/09 0942) Pulse Rate:  [78-95] 95  (05/09 0942) Resp:  [18-20] 20  (05/09 0507) BP: (101-134)/(63-74) 134/74 mmHg (05/09 0942) SpO2:  [85 %-97 %] 90 % (05/09 0942) Last BM Date: 03/14/12  Intake/Output from previous day: 05/08 0701 - 05/09 0700 In: 220 [P.O.:120] Out: 2200 [Urine:2200] Intake/Output this shift:    General appearance: alert, cooperative and out of bed.   GI: soft, slightly distended, approp tender at incisions.   Incision/Wound: c/d/i  Lab Results:   Basename 03/16/12 0438 03/15/12 0430  WBC 16.1* 18.2*  HGB 11.3* 11.6*  HCT 34.9* 35.8*  PLT 230 226   BMET  Basename 03/16/12 0438 03/15/12 0430  NA 138 134*  K 3.6 3.7  CL 98 97  CO2 33* 24  GLUCOSE 130* 145*  BUN 8 9  CREATININE 0.73 0.70  CALCIUM 9.6 8.8   PT/INR No results found for this basename: LABPROT:2,INR:2 in the last 72 hours ABG No results found for this basename: PHART:2,PCO2:2,PO2:2,HCO3:2 in the last 72 hours  Studies/Results: Dg Chest 2 View  03/16/2012  *RADIOLOGY REPORT*  Clinical Data: Abdominal pain.  Cough and congestion.  Shortness of breath.  Hypertension.  Fever, postoperative day 2 status post laparoscopic cholecystectomy and liver cyst unroofing.  CHEST - 2 VIEW  Comparison: Multiple exams, including 03/14/2012  Findings: Elevated right hemidiaphragm noted.  Linear airspace opacities in the right middle lobe favor atelectasis, and there is nonspecific airspace opacity in the right lower lobe best appreciated on the lateral projection which obscure the  right posterior hemidiaphragm which remain nonspecific for atelectasis versus pneumonia.  Prominent cardiopericardial silhouette may be due to the low underlying lung volumes exaggerated heart size.  There is linear retrocardiac opacity favoring subsegmental atelectasis.  Lower cervical plate screw fixator noted.  IMPRESSION:  1.  Right lower lobe airspace opacity is technically nonspecific and could represent atelectasis or pneumonia. 2.  Patchy atelectasis in the right middle lobe with right hemidiaphragmatic elevation. 3.  Mild subsegmental atelectasis in the left lower lobe. 4.  Borderline cardiomegaly.  Original Report Authenticated By: Dellia Cloud, M.D.    Anti-infectives: Anti-infectives     Start     Dose/Rate Route Frequency Ordered Stop   03/14/12 1400   ceFAZolin (ANCEF) IVPB 2 g/50 mL premix        2 g 100 mL/hr over 30 Minutes Intravenous Every 6 hours 03/14/12 1142 03/15/12 0205   03/14/12 0531   ceFAZolin (ANCEF) IVPB 2 g/50 mL premix        2 g 100 mL/hr over 30 Minutes Intravenous 60 min pre-op 03/14/12 0531 03/14/12 0735          Assessment/Plan: s/p Procedure(s) (LRB): LAPAROSCOPIC LIVER CYST UNROOFING (N/A) LAPAROSCOPIC CHOLECYSTECTOMY (N/A) Continue pain control ABL anemia - stable. Ileus - improving.  Advance diet.   Saline lock IVF again.   Path benign, but chronic cholecystitis present.   Oxygen sats - continue working on incentive spirometry  LOS: 3 days    [facility] 03/17/2012

## 2012-03-18 ENCOUNTER — Inpatient Hospital Stay (HOSPITAL_COMMUNITY): Payer: Medicare Other

## 2012-03-18 LAB — CBC
MCHC: 32.1 g/dL (ref 30.0–36.0)
Platelets: 272 10*3/uL (ref 150–400)
RDW: 12.9 % (ref 11.5–15.5)

## 2012-03-18 MED ORDER — BISACODYL 10 MG RE SUPP
10.0000 mg | Freq: Every day | RECTAL | Status: DC
  Administered 2012-03-18: 10 mg via RECTAL
  Filled 2012-03-18: qty 1

## 2012-03-18 MED ORDER — IOHEXOL 300 MG/ML  SOLN
100.0000 mL | Freq: Once | INTRAMUSCULAR | Status: AC | PRN
  Administered 2012-03-18: 100 mL via INTRAVENOUS

## 2012-03-18 MED ORDER — FUROSEMIDE 20 MG PO TABS
20.0000 mg | ORAL_TABLET | Freq: Once | ORAL | Status: AC
  Administered 2012-03-18: 20 mg via ORAL
  Filled 2012-03-18: qty 1

## 2012-03-18 MED ORDER — MOXIFLOXACIN HCL 400 MG PO TABS
400.0000 mg | ORAL_TABLET | Freq: Every day | ORAL | Status: DC
  Administered 2012-03-18: 400 mg via ORAL
  Filled 2012-03-18 (×2): qty 1

## 2012-03-18 NOTE — Progress Notes (Signed)
Patient ID: [name], female   DOB: August 30, 1961, 51 y.o.   MRN: [id number] 4 Days Post-Op  Subjective: Pt continues to have severe pain and sats that drop to 75%.  Nursing reports of significant lethargy with medication, patient reports being awake "the whole time."  Nursing reports patient off premises yesterday with sister, but pt tearful with me and "wanting to go home, but scared about the pain."  Reports of nausea overnight.     Objective: Vital signs in last 24 hours: Temp:  [97.8 F (36.6 C)-98.3 F (36.8 C)] 98.1 F (36.7 C) (05/10 0625) Pulse Rate:  [88-97] 89  (05/10 0625) Resp:  [18-20] 20  (05/10 0625) BP: (100-134)/(63-74) 110/68 mmHg (05/10 0625) SpO2:  [75 %-94 %] 94 % (05/10 0625) Last BM Date: 03/14/12  Intake/Output from previous day: 05/09 0701 - 05/10 0700 In: 6310 [P.O.:5730] Out: 3150 [Urine:3150] Intake/Output this shift:    General appearance: sitting up in bed, drinking coffee.   Tearful with my discussion GI: soft, slightly distended, approp tender at incisions.  No rebound/guarding.   Incision/Wound: c/d/i  Lab Results:   Basename 03/18/12 0422 03/16/12 0438  WBC 9.7 16.1*  HGB 10.7* 11.3*  HCT 33.3* 34.9*  PLT 272 230   BMET  Basename 03/16/12 0438  NA 138  K 3.6  CL 98  CO2 33*  GLUCOSE 130*  BUN 8  CREATININE 0.73  CALCIUM 9.6   PT/INR No results found for this basename: LABPROT:2,INR:2 in the last 72 hours ABG No results found for this basename: PHART:2,PCO2:2,PO2:2,HCO3:2 in the last 72 hours  Studies/Results: Dg Chest 2 View  03/16/2012  *RADIOLOGY REPORT*  Clinical Data: Abdominal pain.  Cough and congestion.  Shortness of breath.  Hypertension.  Fever, postoperative day 2 status post laparoscopic cholecystectomy and liver cyst unroofing.  CHEST - 2 VIEW  Comparison: Multiple exams, including 03/14/2012  Findings: Elevated right hemidiaphragm noted.  Linear airspace opacities in the right middle lobe favor atelectasis, and  there is nonspecific airspace opacity in the right lower lobe best appreciated on the lateral projection which obscure the right posterior hemidiaphragm which remain nonspecific for atelectasis versus pneumonia.  Prominent cardiopericardial silhouette may be due to the low underlying lung volumes exaggerated heart size.  There is linear retrocardiac opacity favoring subsegmental atelectasis.  Lower cervical plate screw fixator noted.  IMPRESSION:  1.  Right lower lobe airspace opacity is technically nonspecific and could represent atelectasis or pneumonia. 2.  Patchy atelectasis in the right middle lobe with right hemidiaphragmatic elevation. 3.  Mild subsegmental atelectasis in the left lower lobe. 4.  Borderline cardiomegaly.  Original Report Authenticated By: Dellia Cloud, M.D.    Anti-infectives: Anti-infectives     Start     Dose/Rate Route Frequency Ordered Stop   03/18/12 1800   moxifloxacin (AVELOX) tablet 400 mg        400 mg Oral Daily-1800 03/18/12 0818     03/14/12 1400   ceFAZolin (ANCEF) IVPB 2 g/50 mL premix        2 g 100 mL/hr over 30 Minutes Intravenous Every 6 hours 03/14/12 1142 03/15/12 0205   03/14/12 0531   ceFAZolin (ANCEF) IVPB 2 g/50 mL premix        2 g 100 mL/hr over 30 Minutes Intravenous 60 min pre-op 03/14/12 0531 03/14/12 0735          Assessment/Plan: s/p Procedure(s) (LRB): LAPAROSCOPIC LIVER CYST UNROOFING (N/A) LAPAROSCOPIC CHOLECYSTECTOMY (N/A) Continue pain control ABL anemia -  stable. Ileus - improving.  Leave on diet as tolerated.   Saline lock IVF again.  OK to get IV meds.   Path benign, but chronic cholecystitis present.   Oxygen sats - continue working on incentive spirometry.  Had drop into 70s last night.  Given pain, and hypoxia, will get CT chest/abd/pelvis to rule out abscess/PE.   LOS: 4 days    [facility] 03/18/2012

## 2012-03-19 LAB — CBC
MCH: 28.9 pg (ref 26.0–34.0)
Platelets: 301 10*3/uL (ref 150–400)
RBC: 4.15 MIL/uL (ref 3.87–5.11)
WBC: 9.8 10*3/uL (ref 4.0–10.5)

## 2012-03-19 NOTE — Progress Notes (Signed)
Pt d/c'd via wheelchair accompanied by NT and sister to meet awaited vehicle at front entrance. Assessment unchanged. Verbalized understanding of discharge instructions. Gave prescription X1.

## 2012-03-19 NOTE — Progress Notes (Signed)
Patient ID: [name], female   DOB: 1961-02-13, 51 y.o.   MRN: [id number] 5 Days Post-Op  Subjective: Feels better, up walking in the halls. Occasional brief sharp pain in the right upper quadrant but much improved from the last couple of days. Denies shortness of breath or chest pain.  Objective: Vital signs in last 24 hours: Temp:  [98.9 F (37.2 C)] 98.9 F (37.2 C) (05/11 0554) Pulse Rate:  [87-90] 90  (05/11 0554) Resp:  [18] 18  (05/11 0554) BP: (107-121)/(74-83) 107/74 mmHg (05/11 0554) SpO2:  [92 %-93 %] 92 % (05/11 0554) Last BM Date: 03/14/12 O2 sats checked on room air and is 97% on patient taking deep breaths Intake/Output from previous day: 05/10 0701 - 05/11 0700 In: 260 [P.O.:260] Out: 600 [Urine:600] Intake/Output this shift:    General appearance: alert and no distress GI: abnormal findings:  mild tenderness in the RUQ Incisions clean and dry Lab Results:   [facility] 03/19/12 0447 03/18/12 0422  WBC 9.8 9.7  HGB 12.0 10.7*  HCT 36.9 33.3*  PLT 301 272   BMET No results found for this basename: NA:2,K:2,CL:2,CO2:2,GLUCOSE:2,BUN:2,CREATININE:2,CALCIUM:2 in the last 72 hours   Studies/Results: Dg Chest 1 View  03/18/2012  *RADIOLOGY REPORT*  Clinical Data: Shortness of breath and hypoxia.  CHEST - 1 VIEW  Comparison: 03/16/2012  Findings: Persistent elevation of the right hemidiaphragm.  Linear right basilar densities are suggestive for atelectasis.  Upper lungs are clear without focal airspace disease.  Heart size is upper limits of normal.  Trachea is midline.  Surgical plate in the lower cervical spine.  IMPRESSION: Elevation of the right hemidiaphragm with right basilar densities.  Findings are suggestive for volume loss and atelectasis.  Original Report Authenticated By: Richarda Overlie, M.D.   Ct Angio Chest W/cm &/or Wo Cm  03/18/2012  *RADIOLOGY REPORT*  Clinical Data: Chest pain and shortness of breath.  Status post cholecystectomy and unroofing of  hepatic cyst on 03/14/2012.  CT ANGIOGRAPHY CHEST  Technique:  Multidetector CT imaging of the chest using the standard protocol during bolus administration of intravenous contrast. Multiplanar reconstructed images including MIPs were obtained and reviewed to evaluate the vascular anatomy.  Contrast: OMNIPAQUE IOHEXOL 300 MG/ML  SOLN  Comparison: None.  Findings: There is dense atelectasis of the right lower lobe with associated small right pleural effusion.  Milder left basilar atelectasis present.  There is no evidence of acute pulmonary embolism.  No pulmonary edema.  No pericardial fluid.  No incidental nodules or enlarged lymph nodes.  IMPRESSION: Dense atelectasis of the right lower lobe postoperatively with associated small right pleural effusion.  Milder left lower lobe atelectasis.  No evidence of pulmonary embolism.  Original Report Authenticated By: Reola Calkins, M.D.   Ct Abdomen Pelvis W Contrast  03/18/2012  *RADIOLOGY REPORT*  Clinical Data: Right upper quadrant abdominal pain following cholecystectomy and unroofing of hepatic cyst.  CT ABDOMEN AND PELVIS WITH CONTRAST  Technique:  Multidetector CT imaging of the abdomen and pelvis was performed following the standard protocol during bolus administration of intravenous contrast.  Contrast: OMNIPAQUE IOHEXOL 300 MG/ML  SOLN  Comparison: MRI of the abdomen dated 12/25/2011  Findings: After the surgical procedure, there is residual fluid present at the level of the unroofed hepatic cyst measuring approximately 3.5 x 4.3 cm transversely.  In this region, there is no air or abnormal surrounding enhancement to suggest the development of abscess. The fluid extends to the capsular surface at the dome  of the liver.  More inferiorly, a small rim of simple appearing fluid is present adjacent to the lateral right lobe.  After cholecystectomy, no abnormal fluid collection is seen in the gallbladder fossa.  The common bile duct and intrahepatic  ducts are nondilated.  No evidence of significant ileus or bowel obstruction postoperatively.  No hernias.  The kidneys, pancreas, adrenal glands and spleen are unremarkable.  The bladder has a normal appearance.  IMPRESSION: No significant findings in the abdomen or pelvis postoperatively. Residual fluid is present at the level of the unroofed hepatic cyst near the dome of the liver.  There is no evidence of postoperative abscess.  The gallbladder fossa demonstrates no postoperative fluid collections.  Original Report Authenticated By: Reola Calkins, M.D.    Anti-infectives: Anti-infectives     Start     Dose/Rate Route Frequency Ordered Stop   03/18/12 1800   moxifloxacin (AVELOX) tablet 400 mg        400 mg Oral Daily-1800 03/18/12 0818     03/14/12 1400   ceFAZolin (ANCEF) IVPB 2 g/50 mL premix        2 g 100 mL/hr over 30 Minutes Intravenous Every 6 hours 03/14/12 1142 03/15/12 0205   03/14/12 0531   ceFAZolin (ANCEF) IVPB 2 g/50 mL premix        2 g 100 mL/hr over 30 Minutes Intravenous 60 min pre-op 03/14/12 0531 03/14/12 0735          Assessment/Plan: s/p Procedure(s): LAPAROSCOPIC LIVER CYST UNROOFING LAPAROSCOPIC CHOLECYSTECTOMY Doing better today. No specific complication identified on imaging or lab work above. O2 saturations improved. Appears okay for discharge. She wants to go home.    LOS: 5 days    Clarabelle Oscarson T 03/19/2012

## 2012-03-27 NOTE — Discharge Summary (Signed)
Physician Discharge Summary  Patient ID: [name] MRN: [id number] DOB/AGE: 1961/10/04 51 y.o.  Admit date: 03/14/2012 Discharge date: 03/27/2012  Admission Diagnoses: Chronic cholecystitis, liver cyst are principal problems Patient Active Problem List  Diagnoses  . ANXIETY  . DEPRESSION  . HYPERTENSION  . SINUSITIS, ACUTE  . BREAST MASSES, BILATERAL  . DEGENERATIVE DISC DISEASE, CERVICAL SPINE  . LOW BACK PAIN  . SYNCOPE  . SEIZURE DISORDER  . INSOMNIA  . DEPENDENT EDEMA, LEGS, BILATERAL  . HEADACHE  . COUGH  . TOBACCO USE, QUIT  . TAH/BSO, HX OF  . Cystic liver mass  . Cholecystitis chronic  . Constipation  . Diverticulosis of colon (without mention of hemorrhage)  . Dyspepsia  . Dysphagia  . Unspecified gastritis and gastroduodenitis without mention of hemorrhage    Discharge Diagnoses:  Same  Discharged Condition: stable  Hospital Course:  Pt admitted to the floor after her lap chole and lap liver cyst unroofing.  She had significant pain and required high levels of narcotics.  She had some post operative drift of her HCT.   She also had a significant ileus that developed.  She also had persistent hypoxia that was felt to be due to atelectasis, but this was significant.  When she continued to have pain out of proportion to her surgery, a CT was performed prior to her D/c.  This did not reveal any abscess or PE.  She was finally placed on a regimen of narcotics that worked for her.  Her ileus resolved.  She was discharged to home in stable condition.    Consults: None  Significant Diagnostic Studies: radiology: CT scan: no PE, no abscess.    Treatments: surgery: lap chole, lap liver cyst unroofing.    Discharge Exam: Blood pressure 104/71, pulse 90, temperature 98.9 F (37.2 C), temperature source Oral, resp. rate 18, height 5\' 4"  (1.626 m), weight 183 lb (83.008 kg), SpO2 92.00%. General appearance: alert, cooperative and mild distress GI: soft,  appropriately tender  Disposition: 01-Home or Self Care  Discharge Orders    Future Orders Please Complete By Expires   Discharge patient        Medication List  As of 03/27/2012  7:36 AM   TAKE these medications         ALPRAZolam 1 MG tablet   Commonly known as: XANAX   Take 1 mg by mouth 3 (three) times daily.      atenolol 50 MG tablet   Commonly known as: TENORMIN   Take 50 mg by mouth daily with breakfast.      atorvastatin 40 MG tablet   Commonly known as: LIPITOR   Take 40 mg by mouth every evening.      calcium citrate-vitamin D 315-200 MG-UNIT per tablet   Commonly known as: CITRACAL+D   Take 1 tablet by mouth daily with breakfast. For anxiety      citalopram 40 MG tablet   Commonly known as: CELEXA   Take 40 mg by mouth daily with breakfast.      CITRUCEL 500 MG Tabs   Generic drug: Methylcellulose (Laxative)   Take by mouth.      docusate sodium 100 MG capsule   Commonly known as: COLACE   Take 100 mg by mouth at bedtime.      fenofibrate micronized 130 MG capsule   Commonly known as: ANTARA   Take 130 mg by mouth daily before breakfast.      FLEXERIL 10 MG tablet  Generic drug: cyclobenzaprine   Take 10 mg by mouth 3 (three) times daily. For muscle spasms      HYDROcodone-acetaminophen 10-325 MG per tablet   Commonly known as: NORCO   Take 1-2 tablets by mouth every 6 (six) hours as needed for pain. For pain      HYDROmorphone 2 MG tablet   Commonly known as: DILAUDID   Take 1-2 tablets (2-4 mg total) by mouth every 3 (three) hours as needed for pain.      lisinopril-hydrochlorothiazide 20-25 MG per tablet   Commonly known as: PRINZIDE,ZESTORETIC   Take 1 tablet by mouth daily with breakfast.      pantoprazole 40 MG tablet   Commonly known as: PROTONIX   Take 1 tablet (40 mg total) by mouth 2 (two) times daily.      traZODone 100 MG tablet   Commonly known as: DESYREL   Take 200 mg by mouth at bedtime. Takes 200 mg at bedtime             Follow-up Information    Follow up with [facility], MD in 3 weeks.   Contact information:   [employer] [address] [place] [address] [phone]          Signed: Almond Lint 03/27/2012, 7:36 AM

## 2012-04-15 ENCOUNTER — Ambulatory Visit (INDEPENDENT_AMBULATORY_CARE_PROVIDER_SITE_OTHER): Payer: Medicare Other | Admitting: General Surgery

## 2012-04-15 ENCOUNTER — Encounter (INDEPENDENT_AMBULATORY_CARE_PROVIDER_SITE_OTHER): Payer: Self-pay | Admitting: General Surgery

## 2012-04-15 VITALS — BP 108/80 | HR 76 | Temp 96.9°F | Ht 64.0 in | Wt 179.6 lb

## 2012-04-15 DIAGNOSIS — K811 Chronic cholecystitis: Secondary | ICD-10-CM

## 2012-04-15 NOTE — Assessment & Plan Note (Signed)
Resolved

## 2012-04-15 NOTE — Patient Instructions (Signed)
Call if you need anything! 

## 2012-04-15 NOTE — Progress Notes (Signed)
HISTORY: Pt is doing much better since going home.  She is not taking any narcotic other than the vicodin she was on pre op for neck and back pain.  She still has a little bit of nausea, but this has dramatically improved.  She is eating.  She denies diarrhea.  She is otherwise well.  No fevers/ chills.      EXAM: General:  A&O x3 Incision:  Well healed.     PATHOLOGY: 1. Gallbladder - MILD CHRONIC CHOLECYSTITIS. - NO TUMOR SEEN. 2. Liver, wedge biopsy, cyst wall - BENIGN MULTILOCULAR BILIARY CYST ASSOCIATED WITH BILARY HAMARTOMAS (VON MEYENBURG'S COMPLEXES). - NO EVIDENCE OF MALIGNANCY. - SEE COMMENT.   ASSESSMENT AND PLAN:   Pt doing well post op.  Follow up as needed.      Maudry Diego, MD Surgical Oncology, General & Endocrine Surgery [facility]  Bennie Pierini, FNP, FNP Daphine Deutscher, Mary-Margaret, *

## 2012-10-20 ENCOUNTER — Other Ambulatory Visit (INDEPENDENT_AMBULATORY_CARE_PROVIDER_SITE_OTHER): Payer: Self-pay | Admitting: General Surgery

## 2012-10-20 ENCOUNTER — Other Ambulatory Visit (INDEPENDENT_AMBULATORY_CARE_PROVIDER_SITE_OTHER): Payer: Self-pay

## 2012-10-20 ENCOUNTER — Telehealth (INDEPENDENT_AMBULATORY_CARE_PROVIDER_SITE_OTHER): Payer: Self-pay

## 2012-10-20 DIAGNOSIS — K7689 Other specified diseases of liver: Secondary | ICD-10-CM

## 2012-10-20 LAB — CBC WITH DIFFERENTIAL/PLATELET
Basophils Absolute: 0 10*3/uL (ref 0.0–0.1)
HCT: 41.3 % (ref 36.0–46.0)
Hemoglobin: 13.9 g/dL (ref 12.0–15.0)
Lymphocytes Relative: 37 % (ref 12–46)
Monocytes Absolute: 0.7 10*3/uL (ref 0.1–1.0)
Neutro Abs: 5 10*3/uL (ref 1.7–7.7)
RDW: 13.6 % (ref 11.5–15.5)
WBC: 9.9 10*3/uL (ref 4.0–10.5)

## 2012-10-20 LAB — COMPREHENSIVE METABOLIC PANEL
ALT: 21 U/L (ref 0–35)
AST: 14 U/L (ref 0–37)
Albumin: 3.8 g/dL (ref 3.5–5.2)
BUN: 16 mg/dL (ref 6–23)
Calcium: 9 mg/dL (ref 8.4–10.5)
Chloride: 107 mEq/L (ref 96–112)
Potassium: 4.4 mEq/L (ref 3.5–5.3)
Total Protein: 6 g/dL (ref 6.0–8.3)

## 2012-10-20 NOTE — Telephone Encounter (Signed)
Per Dr Arita Miss order ct abd and pelvis with contrast set up for 10-21-12 and cbc and cmet done stat today so results will be avail for Dr Donell Beers to review 12-13. Copy of orders given to [facility] to follow.

## 2012-10-21 ENCOUNTER — Ambulatory Visit
Admission: RE | Admit: 2012-10-21 | Discharge: 2012-10-21 | Disposition: A | Discharge status: Home / Self Care | Payer: Medicare Other | Source: Ambulatory Visit | Attending: General Surgery | Admitting: General Surgery

## 2012-10-21 ENCOUNTER — Telehealth (INDEPENDENT_AMBULATORY_CARE_PROVIDER_SITE_OTHER): Payer: Self-pay

## 2012-10-21 DIAGNOSIS — K7689 Other specified diseases of liver: Secondary | ICD-10-CM

## 2012-10-21 MED ORDER — IOHEXOL 300 MG/ML  SOLN
100.0000 mL | Freq: Once | INTRAMUSCULAR | Status: AC | PRN
  Administered 2012-10-21: 100 mL via INTRAVENOUS

## 2012-10-21 NOTE — Telephone Encounter (Signed)
Pt called requesting lab results from yesterday. Pt advised I will send msg to Dr Arita Miss assistant to review with Dr Donell Beers and call pt back with results. Bernie given msg and phone # to call pt.

## 2012-10-24 ENCOUNTER — Telehealth (INDEPENDENT_AMBULATORY_CARE_PROVIDER_SITE_OTHER): Payer: Self-pay

## 2012-10-24 NOTE — Telephone Encounter (Signed)
LMOV pt's scan is normal.  She should address her pain and nausea with a GI doctor.  We will be glad to refer her.

## 2012-10-24 NOTE — Telephone Encounter (Signed)
Pt's GI doctor is Danise Edge.  She will contact his office for an appointment.

## 2012-10-31 ENCOUNTER — Telehealth: Payer: Self-pay | Admitting: Gastroenterology

## 2012-10-31 NOTE — Telephone Encounter (Signed)
i agree. 

## 2012-10-31 NOTE — Telephone Encounter (Signed)
Pt continues to have pain on the left and right side of the abd.  Dr Donell Beers removed gallbladder and tumor from liver in May.  The pain has increased in the past week.  CT was done by Dr Donell Beers two  weeks ago was told to follow up with Dr Christella Hartigan  Pt was given appt with Dr Christella Hartigan for 12/05/12.  She will call if any symptoms change or worsen.

## 2012-11-11 ENCOUNTER — Encounter (INDEPENDENT_AMBULATORY_CARE_PROVIDER_SITE_OTHER): Payer: Medicare Other | Admitting: General Surgery

## 2012-12-05 ENCOUNTER — Encounter: Payer: Self-pay | Admitting: Gastroenterology

## 2012-12-05 ENCOUNTER — Ambulatory Visit (INDEPENDENT_AMBULATORY_CARE_PROVIDER_SITE_OTHER): Payer: Medicare Other | Admitting: Gastroenterology

## 2012-12-05 VITALS — BP 100/70 | HR 68 | Ht 64.0 in | Wt 167.0 lb

## 2012-12-05 DIAGNOSIS — R109 Unspecified abdominal pain: Secondary | ICD-10-CM

## 2012-12-05 DIAGNOSIS — R634 Abnormal weight loss: Secondary | ICD-10-CM

## 2012-12-05 NOTE — Patient Instructions (Addendum)
You will be set up for an upper endoscopy for weight loss, abdominal pain that is improved with eating (EGD with MAC sedation). If the EGD is negative, then would proceed with gastric emptying scan.

## 2012-12-05 NOTE — Progress Notes (Signed)
Review of pertinent gastrointestinal problems: 1. Routine risk for colon cancer. Colonoscopy March 2013 found mild diverticulosis only. Recommended 10 year recall colonoscopy 2. intermittent abdominal pains, felt possibly due to 4-5 cm cystic lesion in liver. She underwent wedge resection of liver 03/2012 and these were found to be hamartomas. Her gallbladder was also removed at the same time. 3. EGD for mild dysphasia, dyspepsia March 2013 showed mild gastritis that was H. pylori negative.   HPI: This is a   very pleasant 52 year old woman whom I last saw about a year ago.  CT scan Of abdomen and pelvis with IV and oral contrast last month was normal CBC, complete metabolic profile last month were normal  Following wedge resection and GB resection 5 2013 her vomiting definitely improved.  Her pain was also improved. She overall felt better after the surgery.  She is still bothered by other abdominal pains upper abdomen bilaterally around lower rib cages.  The pain occurs daily.  Can be constant. Eating usually helps the pains.  Limits her diet a bit.  Eating small amounts help.  Has disc issues in neck and is on norco 3-4 times per day. No other meds such as NSAIDs.  She takes protonix bid, every day. This used to help  She has nausea again, anorexia.    Her weight is 13 pounds down from here on same scale last year.  GM had colon cancer.  Her dad had lung cancer.   Past Medical History  Diagnosis Date  . Liver lesion   . Hypertension   . Breast changes, fibrocystic   . Idiopathic thrombocytopenia     as child  . Arthritis   . Clotting disorder      itp , none since 1976, no current hematologist  . GERD (gastroesophageal reflux disease)   . Hyperlipidemia   . Osteoporosis   . Nasal congestion   . Trouble swallowing   . Leg swelling     both ankles  . Abdominal pain   . Rectal bleeding   . Blood in stool   . Unintentional weight loss   . Constipation   . Nausea &  vomiting   . Generalized headaches   . Weakness     weakness varies  . Bruises easily   . Anxiety   . Depression   . DDD (degenerative disc disease), cervical     with lumbar issues  . Seizures 1998    stress induced, one time, none since, no seizure meds  . Neuromuscular disorder     back  injury  . Neuromuscular disorder     3 neck surgeries,neck fusion.pin,plates,screws  . Panic attacks     pt also  has germaphobia  . Hemorrhoid   . Spleen enlarged 1976, none since    r/t idiopathic thrombocytopenia  . Spleen enlarged 1976  . Spleen enlarged 1976  . Sleep apnea     STOPBANG=4    Past Surgical History  Procedure Date  . Cervical spine surgery 2005, 2007, 2010    x3, fusion done  . Cesarean section 1988, 1994  . Abdominal hysterectomy 1997  . Colonscopy and esophagogastrodudononescopy 02-04-12  . Cholecystectomy 03/14/2012    Procedure: LAPAROSCOPIC CHOLECYSTECTOMY;  Surgeon: Almond Lint, MD;  Location: [facility];  Service: General;  Laterality: N/A;    Current Outpatient Prescriptions  Medication Sig Dispense Refill  . ALPRAZolam (XANAX) 1 MG tablet Take 1 mg by mouth 3 (three) times daily.       Marland Kitchen  atenolol (TENORMIN) 50 MG tablet Take 50 mg by mouth daily with breakfast.       . citalopram (CELEXA) 40 MG tablet Take 40 mg by mouth daily with breakfast.       . cyclobenzaprine (FLEXERIL) 10 MG tablet Take 10 mg by mouth 3 (three) times daily as needed. For muscle spasms      . docusate sodium (COLACE) 100 MG capsule Take 100 mg by mouth at bedtime.      Marland Kitchen HYDROcodone-acetaminophen (NORCO) 10-325 MG per tablet Take 1-2 tablets by mouth every 6 (six) hours as needed for pain. For pain  60 tablet  0  . lisinopril-hydrochlorothiazide (PRINZIDE,ZESTORETIC) 20-25 MG per tablet Take 1 tablet by mouth daily with breakfast.       . pantoprazole (PROTONIX) 40 MG tablet Take 1 tablet (40 mg total) by mouth 2 (two) times daily.  180 tablet  3  . traZODone (DESYREL) 100 MG tablet Take  200 mg by mouth at bedtime. Takes 200 mg at bedtime      . [DISCONTINUED] hydrochlorothiazide (HYDRODIURIL) 25 MG tablet Take 25 mg by mouth daily.        Allergies as of 12/05/2012 - Review Complete 12/05/2012  Allergen Reaction Noted  . Nsaids Nausea Only 01/06/2012    Family History  Problem Relation Age of Onset  . Heart disease Maternal Grandmother   . Hypertension Mother   . Heart disease Mother   . Diabetes Sister   . Lung cancer Father   . Cancer Father     lung  . Colon cancer Paternal Grandmother   . Cancer Paternal Grandmother     colon    History   Social History  . Marital Status: Divorced    Spouse Name: N/A    Number of Children: 3  . Years of Education: N/A   Occupational History  . Diabled    Social History Main Topics  . Smoking status: Former Smoker -- 1.0 packs/day for 5 years    Types: Cigarettes    Quit date: 12/29/2011  . Smokeless tobacco: Never Used  . Alcohol Use: No  . Drug Use: No  . Sexually Active: Not on file   Other Topics Concern  . Not on file   Social History Narrative  . No narrative on file      Physical Exam: BP 100/70  Pulse 68  Ht 5\' 4"  (1.626 m)  Wt 167 lb (75.751 kg)  BMI 28.67 kg/m2 Constitutional: generally well-appearing Psychiatric: alert and oriented x3 Abdomen: soft, mildly tender throughout, nondistended, no obvious ascites, no peritoneal signs, normal bowel sounds     Assessment and plan: 52 y.o. female with chronic daily abdominal pain, improved when she eats  CT scan one month ago and CBC, complete metabolic profile failed to describe the source for abdominal pain. She underwent EGD about a year ago as well as colonoscopy these don't explain it. She had weight resection of her liver as well as gallbladder removal for hamartomatous liver lesion. This was done laparoscopically last year. Is not clear today was causing her intermittent pains. This happened daily and usually eating improved the pains.  She has lost about 13 pounds since her last visit here. I would like to repeat her EGD to check for peptic ulcer disease since that seems most consistent with her symptoms. If the EGD is nonrevealing then I would check a gastric emptying scan. She may have gastroparesis from her daily narcotic usage.

## 2012-12-09 ENCOUNTER — Ambulatory Visit (AMBULATORY_SURGERY_CENTER): Payer: Medicare Other | Admitting: Gastroenterology

## 2012-12-09 ENCOUNTER — Encounter: Payer: Self-pay | Admitting: Gastroenterology

## 2012-12-09 VITALS — BP 126/79 | HR 61 | Temp 97.6°F | Resp 13 | Ht 64.0 in | Wt 167.0 lb

## 2012-12-09 DIAGNOSIS — R634 Abnormal weight loss: Secondary | ICD-10-CM

## 2012-12-09 DIAGNOSIS — D131 Benign neoplasm of stomach: Secondary | ICD-10-CM

## 2012-12-09 DIAGNOSIS — K2970 Gastritis, unspecified, without bleeding: Secondary | ICD-10-CM

## 2012-12-09 DIAGNOSIS — R109 Unspecified abdominal pain: Secondary | ICD-10-CM

## 2012-12-09 MED ORDER — SODIUM CHLORIDE 0.9 % IV SOLN: 500.0000 mL | INTRAVENOUS | Status: DC

## 2012-12-09 NOTE — Progress Notes (Signed)
No egg or soy allergy. ewm 

## 2012-12-09 NOTE — Op Note (Signed)
[facility] [address] [place], [address]   ENDOSCOPY PROCEDURE REPORT  PATIENT: Betty, Garza  MR#: [id number] BIRTHDATE: [birth year] , 51  yrs. old GENDER: Female ENDOSCOPIST: Rachael Fee, MD PROCEDURE DATE:  12/09/2012 PROCEDURE:  EGD w/ biopsy ASA CLASS:     Class III INDICATIONS:  chronic intermittent RUQ pains, usually improved with eating, GB removed 1 year ago. MEDICATIONS: propofol (Diprivan) 150mg  IV and MAC sedation, administered by CRNA TOPICAL ANESTHETIC: none  DESCRIPTION OF PROCEDURE: After the risks benefits and alternatives of the procedure were thoroughly explained, informed consent was obtained.  The LB [id number] endoscope was introduced through the mouth and advanced to the second portion of the duodenum. Without limitations.  The instrument was slowly withdrawn as the mucosa was fully examined.    There was mild pan-gastritis.  This was biopsied and sent to pathology.  The examination was otherwise normal.  Retroflexed views revealed no abnormalities.     The scope was then withdrawn from the patient and the procedure completed. COMPLICATIONS: There were no complications.  ENDOSCOPIC IMPRESSION: There was mild pan-gastritis, biopsied The examination was otherwise normal  RECOMMENDATIONS: If biopsies show H.  pylori, you will be started on appropriate antibiotics.  If not, then we will set up Gastric Emptying Scan to check for gastroparesis.    eSigned:  Rachael Fee, MD 12/09/2012 2:55 PM   CC: Paulene Floor, MD

## 2012-12-09 NOTE — Patient Instructions (Addendum)

## 2012-12-09 NOTE — Progress Notes (Signed)
In PACU, vss, bbs=clear report to RN,

## 2012-12-09 NOTE — Progress Notes (Signed)
Called to room to assist during endoscopic procedure.  Patient ID and intended procedure confirmed with present staff. Received instructions for my participation in the procedure from the performing physician.  

## 2012-12-09 NOTE — Progress Notes (Signed)
Patient did not experience any of the following events: a burn prior to discharge; a fall within the facility; wrong site/side/patient/procedure/implant event; or a hospital transfer or hospital admission upon discharge from the facility. (G8907) Patient did not have preoperative order for IV antibiotic SSI prophylaxis. (G8918)  

## 2012-12-12 ENCOUNTER — Telehealth: Payer: Self-pay | Admitting: *Deleted

## 2012-12-12 NOTE — Telephone Encounter (Signed)
Called patient to inform that Dr. Christella Hartigan agrees with patient seeing PCP regarding headache and B/P. Patient also informed Dr. Christella Hartigan is awaiting biopsy results from 12/09/12. Patient verbalized understanding. Patient had not contacted PCP as of yet related to delay in opening of the office.

## 2012-12-12 NOTE — Telephone Encounter (Signed)
i agree with her seeing pcp about BP and headaches.  Awaiting path from biopsies 1/31 procedures.

## 2012-12-12 NOTE — Telephone Encounter (Signed)
  Follow up Call-  Call back number 12/09/2012  Post procedure Call Back phone  # [phone]  Permission to leave phone message Yes     Patient questions:  Do you have a fever, pain , or abdominal swelling? yes Pain Score  10 *  Have you tolerated food without any problems? yes  Have you been able to return to your normal activities? no  Do you have any questions about your discharge instructions: Diet   no Medications  no Follow up visit  no  Do you have questions or concerns about your Care? no  Actions: * If pain score is 4 or above: Physician/ provider Notified : Rob Bunting, MD. FOLLOW UP CALL TO PATIENT THIS MORNING.  PATIENT REPORTS SEVERE HEADACHE SINCE Saturday, [place] 1.2014. PATIENT REPORTS THAT HER BP HAS BEEN ELEVATED THIS WEEKEND. BP THIS AM, AT 0400 150/103 WITH 10/10 HEADACHE. AFTER TAKING BP MED THIS AM AND RESTING ON SOFA,0/10 AT PRESENT.  PATIENT REPORTS THAT SHE DID NOT HAVE HER BP MEDICATION 1/29 AND 1/30. PATIENT PICKING UP PRESCRIPTION ON Saturday, 12/10/12.(LISINOPRIL  WITH HCTZ 20/25) PATIENT STATING SHE HAS BEEN BLOATED SINCE PROCEDURE ON 1/31. PATIENT TOOK A CORRECTAL LAST EVENING WHICH CAUSED NAUSEA,VOMITING AND ALSO RELIEF OF CONSTIPATION.   PATIENT STATING SHE CONTINUES WITH UPPER QUADRANT PAIN AS BEFORE PROCEDURE. DENIES FEVER AND THE PATIENT REPORTS THAT SHE IS ABLE TO DRINK AND EAT. INSTRUCTED THE PATIENT TO CALL HER PCP THIS AM FOR AN APPOINTMENT TODAY RELATED TO ELEVATED BP AND HEADACHE. PATIENT VERBALIZED UNDERSTANDING AND AGREED TO RECOMMENDATIONS.  NOTE FORWARDED TO DR. Christella Hartigan FOR ANY FURTHER INSTRUCTIONS.

## 2012-12-15 ENCOUNTER — Other Ambulatory Visit: Payer: Self-pay

## 2012-12-15 DIAGNOSIS — R140 Abdominal distension (gaseous): Secondary | ICD-10-CM

## 2012-12-15 NOTE — Progress Notes (Signed)
  You have been scheduled for a gastric emptying scan at [facility] on 12/20/12 at 1230 pm. Please arrive at least 15 minutes prior to your appointment for registration. Please make certain not to have anything to eat or drink after midnight the night before your test. Hold all stomach medications (ex: Zofran, phenergan, Reglan) 48 hours prior to your test. If you need to reschedule your appointment, please contact radiology scheduling at [phone]. _____________________________________________________________________ A gastric-emptying study measures how long it takes for food to move through your stomach. There are several ways to measure stomach emptying. In the most common test, you eat food that contains a small amount of radioactive material. A scanner that detects the movement of the radioactive material is placed over your abdomen to monitor the rate at which food leaves your stomach. This test normally takes about 2 hours to complete. _____________________________________________________________________ Pt is aware and will have GES

## 2012-12-19 ENCOUNTER — Telehealth: Payer: Self-pay | Admitting: Gastroenterology

## 2012-12-19 ENCOUNTER — Encounter (HOSPITAL_COMMUNITY): Payer: Medicare Other

## 2012-12-19 NOTE — Telephone Encounter (Signed)
noted 

## 2012-12-29 ENCOUNTER — Ambulatory Visit (HOSPITAL_COMMUNITY)
Admission: RE | Admit: 2012-12-29 | Discharge: 2012-12-29 | Disposition: A | Discharge status: Home / Self Care | Payer: Medicare Other | Source: Ambulatory Visit | Attending: Gastroenterology | Admitting: Gastroenterology

## 2012-12-29 DIAGNOSIS — R142 Eructation: Secondary | ICD-10-CM | POA: Insufficient documentation

## 2012-12-29 DIAGNOSIS — R141 Gas pain: Secondary | ICD-10-CM | POA: Insufficient documentation

## 2012-12-29 DIAGNOSIS — R140 Abdominal distension (gaseous): Secondary | ICD-10-CM

## 2012-12-29 DIAGNOSIS — R143 Flatulence: Secondary | ICD-10-CM | POA: Insufficient documentation

## 2012-12-29 MED ORDER — TECHNETIUM TC 99M SULFUR COLLOID
1.9000 | Freq: Once | INTRAVENOUS | Status: AC | PRN
  Administered 2012-12-29: 1.9 via INTRAVENOUS

## 2013-02-07 ENCOUNTER — Ambulatory Visit (INDEPENDENT_AMBULATORY_CARE_PROVIDER_SITE_OTHER): Payer: Medicare Other | Admitting: General Practice

## 2013-02-07 ENCOUNTER — Ambulatory Visit (INDEPENDENT_AMBULATORY_CARE_PROVIDER_SITE_OTHER): Payer: Medicare Other

## 2013-02-07 ENCOUNTER — Telehealth: Payer: Self-pay | Admitting: Nurse Practitioner

## 2013-02-07 ENCOUNTER — Encounter: Payer: Self-pay | Admitting: General Practice

## 2013-02-07 VITALS — BP 115/72 | HR 98 | Temp 97.3°F | Ht 64.0 in | Wt 167.0 lb

## 2013-02-07 DIAGNOSIS — R300 Dysuria: Secondary | ICD-10-CM

## 2013-02-07 DIAGNOSIS — J329 Chronic sinusitis, unspecified: Secondary | ICD-10-CM

## 2013-02-07 DIAGNOSIS — J209 Acute bronchitis, unspecified: Secondary | ICD-10-CM

## 2013-02-07 DIAGNOSIS — J029 Acute pharyngitis, unspecified: Secondary | ICD-10-CM

## 2013-02-07 DIAGNOSIS — R05 Cough: Secondary | ICD-10-CM

## 2013-02-07 DIAGNOSIS — R059 Cough, unspecified: Secondary | ICD-10-CM

## 2013-02-07 LAB — POCT UA - MICROSCOPIC ONLY
Bacteria, U Microscopic: NEGATIVE
Casts, Ur, LPF, POC: NEGATIVE
Crystals, Ur, HPF, POC: NEGATIVE
Mucus, UA: NEGATIVE
Yeast, UA: NEGATIVE

## 2013-02-07 LAB — POCT URINALYSIS DIPSTICK
Bilirubin, UA: NEGATIVE
Glucose, UA: NEGATIVE
Ketones, UA: NEGATIVE
Nitrite, UA: NEGATIVE
Protein, UA: NEGATIVE
Spec Grav, UA: <=1.005
Urobilinogen, UA: NEGATIVE
pH, UA: 7

## 2013-02-07 LAB — POCT RAPID STREP A (OFFICE): Rapid Strep A Screen: NEGATIVE

## 2013-02-07 MED ORDER — BENZONATATE 100 MG PO CAPS: 100.0000 mg | ORAL_CAPSULE | Freq: Two times a day (BID) | ORAL | Status: DC | PRN

## 2013-02-07 MED ORDER — ALBUTEROL SULFATE HFA 108 (90 BASE) MCG/ACT IN AERS: 2.0000 | INHALATION_SPRAY | Freq: Four times a day (QID) | RESPIRATORY_TRACT | Status: DC | PRN

## 2013-02-07 MED ORDER — AMOXICILLIN 500 MG PO CAPS: 500.0000 mg | ORAL_CAPSULE | Freq: Three times a day (TID) | ORAL | Status: DC

## 2013-02-07 NOTE — Patient Instructions (Addendum)
Cough, Adult  A cough is a reflex that helps clear your throat and airways. It can help heal the body or may be a reaction to an irritated airway. A cough may only last 2 or 3 weeks (acute) or may last more than 8 weeks (chronic).  CAUSES Acute cough:  Viral or bacterial infections. Chronic cough:  Infections.  Allergies.  Asthma.  Post-nasal drip.  Smoking.  Heartburn or acid reflux.  Some medicines.  Chronic lung problems (COPD).  Cancer. SYMPTOMS   Cough.  Fever.  Chest pain.  Increased breathing rate.  High-pitched whistling sound when breathing (wheezing).  Colored mucus that you cough up (sputum). TREATMENT   A bacterial cough may be treated with antibiotic medicine.  A viral cough must run its course and will not respond to antibiotics.  Your caregiver may recommend other treatments if you have a chronic cough. HOME CARE INSTRUCTIONS   Only take over-the-counter or prescription medicines for pain, discomfort, or fever as directed by your caregiver. Use cough suppressants only as directed by your caregiver.  Use a cold steam vaporizer or humidifier in your bedroom or home to help loosen secretions.  Sleep in a semi-upright position if your cough is worse at night.  Rest as needed.  Stop smoking if you smoke. SEEK IMMEDIATE MEDICAL CARE IF:   You have pus in your sputum.  Your cough starts to worsen.  You cannot control your cough with suppressants and are losing sleep.  You begin coughing up blood.  You have difficulty breathing.  You develop pain which is getting worse or is uncontrolled with medicine.  You have a fever. MAKE SURE YOU:   Understand these instructions.  Will watch your condition.  Will get help right away if you are not doing well or get worse. Document Released: 04/24/2011 Document Revised: 01/18/2012 Document Reviewed: 04/24/2011 [facility] Patient Information 2013 [place], [place]. Bronchitis Bronchitis is the  body's way of reacting to injury and/or infection (inflammation) of the bronchi. Bronchi are the air tubes that extend from the windpipe into the lungs. If the inflammation becomes severe, it may cause shortness of breath. CAUSES  Inflammation may be caused by:  A virus.  Germs (bacteria).  Dust.  Allergens.  Pollutants and many other irritants. The cells lining the bronchial tree are covered with tiny hairs (cilia). These constantly beat upward, away from the lungs, toward the mouth. This keeps the lungs free of pollutants. When these cells become too irritated and are unable to do their job, mucus begins to develop. This causes the characteristic cough of bronchitis. The cough clears the lungs when the cilia are unable to do their job. Without either of these protective mechanisms, the mucus would settle in the lungs. Then you would develop pneumonia. Smoking is a common cause of bronchitis and can contribute to pneumonia. Stopping this habit is the single most important thing you can do to help yourself. TREATMENT   Your caregiver may prescribe an antibiotic if the cough is caused by bacteria. Also, medicines that open up your airways make it easier to breathe. Your caregiver may also recommend or prescribe an expectorant. It will loosen the mucus to be coughed up. Only take over-the-counter or prescription medicines for pain, discomfort, or fever as directed by your caregiver.  Removing whatever causes the problem (smoking, for example) is critical to preventing the problem from getting worse.  Cough suppressants may be prescribed for relief of cough symptoms.  Inhaled medicines may be prescribed to  help with symptoms now and to help prevent problems from returning.  For those with recurrent (chronic) bronchitis, there may be a need for steroid medicines. SEEK IMMEDIATE MEDICAL CARE IF:   During treatment, you develop more pus-like mucus (purulent sputum).  You have a fever.  Your  baby is older than 3 months with a rectal temperature of 102 F (38.9 C) or higher.  Your baby is 24 months old or younger with a rectal temperature of 100.4 F (38 C) or higher.  You become progressively more ill.  You have increased difficulty breathing, wheezing, or shortness of breath. It is necessary to seek immediate medical care if you are elderly or sick from any other disease. MAKE SURE YOU:   Understand these instructions.  Will watch your condition.  Will get help right away if you are not doing well or get worse. Document Released: 10/26/2005 Document Revised: 01/18/2012 Document Reviewed: 09/04/2008 [facility] Patient Information 2013 [place], [place]. Sinusitis Sinusitis is redness, soreness, and swelling (inflammation) of the paranasal sinuses. Paranasal sinuses are air pockets within the bones of your face (beneath the eyes, the middle of the forehead, or above the eyes). In healthy paranasal sinuses, mucus is able to drain out, and air is able to circulate through them by way of your nose. However, when your paranasal sinuses are inflamed, mucus and air can become trapped. This can allow bacteria and other germs to grow and cause infection. Sinusitis can develop quickly and last only a short time (acute) or continue over a long period (chronic). Sinusitis that lasts for more than 12 weeks is considered chronic.  CAUSES  Causes of sinusitis include:  Allergies.  Structural abnormalities, such as displacement of the cartilage that separates your nostrils (deviated septum), which can decrease the air flow through your nose and sinuses and affect sinus drainage.  Functional abnormalities, such as when the small hairs (cilia) that line your sinuses and help remove mucus do not work properly or are not present. SYMPTOMS  Symptoms of acute and chronic sinusitis are the same. The primary symptoms are pain and pressure around the affected sinuses. Other symptoms include:  Upper  toothache.  Earache.  Headache.  Bad breath.  Decreased sense of smell and taste.  A cough, which worsens when you are lying flat.  Fatigue.  Fever.  Thick drainage from your nose, which often is green and may contain pus (purulent).  Swelling and warmth over the affected sinuses. DIAGNOSIS  Your caregiver will perform a physical exam. During the exam, your caregiver may:  Look in your nose for signs of abnormal growths in your nostrils (nasal polyps).  Tap over the affected sinus to check for signs of infection.  View the inside of your sinuses (endoscopy) with a special imaging device with a light attached (endoscope), which is inserted into your sinuses. If your caregiver suspects that you have chronic sinusitis, one or more of the following tests may be recommended:  Allergy tests.  Nasal culture A sample of mucus is taken from your nose and sent to a lab and screened for bacteria.  Nasal cytology A sample of mucus is taken from your nose and examined by your caregiver to determine if your sinusitis is related to an allergy. TREATMENT  Most cases of acute sinusitis are related to a viral infection and will resolve on their own within 10 days. Sometimes medicines are prescribed to help relieve symptoms (pain medicine, decongestants, nasal steroid sprays, or saline sprays).  However, for  sinusitis related to a bacterial infection, your caregiver will prescribe antibiotic medicines. These are medicines that will help kill the bacteria causing the infection.  Rarely, sinusitis is caused by a fungal infection. In theses cases, your caregiver will prescribe antifungal medicine. For some cases of chronic sinusitis, surgery is needed. Generally, these are cases in which sinusitis recurs more than 3 times per year, despite other treatments. HOME CARE INSTRUCTIONS   Drink plenty of water. Water helps thin the mucus so your sinuses can drain more easily.  Use a  humidifier.  Inhale steam 3 to 4 times a day (for example, sit in the bathroom with the shower running).  Apply a warm, moist washcloth to your face 3 to 4 times a day, or as directed by your caregiver.  Use saline nasal sprays to help moisten and clean your sinuses.  Take over-the-counter or prescription medicines for pain, discomfort, or fever only as directed by your caregiver. SEEK IMMEDIATE MEDICAL CARE IF:  You have increasing pain or severe headaches.  You have nausea, vomiting, or drowsiness.  You have swelling around your face.  You have vision problems.  You have a stiff neck.  You have difficulty breathing. MAKE SURE YOU:   Understand these instructions.  Will watch your condition.  Will get help right away if you are not doing well or get worse. Document Released: 10/26/2005 Document Revised: 01/18/2012 Document Reviewed: 11/10/2011 [facility] Patient Information 2013 [place], [place].

## 2013-02-07 NOTE — Progress Notes (Deleted)
Subjective:    Patient ID: [name], female    DOB: 01/29/1961, 52 y.o.   MRN: [id number]  Cough This is a new problem. The current episode started in the past 7 days. The problem has been gradually worsening. The problem occurs hourly. The cough is productive of sputum (yellowish). Associated symptoms include ear pain, a fever, headaches, myalgias, nasal congestion, postnasal drip, rhinorrhea, a sore throat and shortness of breath. Pertinent negatives include no chest pain, chills or rash. Nothing aggravates the symptoms. Risk factors for lung disease include smoking/tobacco exposure. She has tried rest for the symptoms. The treatment provided no relief. Her past medical history is significant for bronchitis and pneumonia.  Sore Throat  This is a new problem. The current episode started in the past 7 days. The problem has been gradually worsening. Neither side of throat is experiencing more pain than the other. The maximum temperature recorded prior to her arrival was 101 - 101.9 F. The pain is at a severity of 8/10. The pain is moderate. Associated symptoms include congestion, coughing, ear pain, headaches and shortness of breath. Pertinent negatives include no abdominal pain. She has had exposure to strep. She has tried acetaminophen for the symptoms. The treatment provided no relief.      Review of Systems  Constitutional: Positive for fever and appetite change. Negative for chills.       Decreased appetite, due to feeling ill  HENT: Positive for ear pain, congestion, sore throat, rhinorrhea and postnasal drip.   Respiratory: Positive for cough and shortness of breath.        Shortness of breath with coughing  Cardiovascular: Negative for chest pain and palpitations.  Gastrointestinal: Negative for abdominal pain.  Genitourinary: Negative for difficulty urinating.  Musculoskeletal: Positive for myalgias.  Skin: Negative for rash.  Neurological: Positive for headaches.   Psychiatric/Behavioral: Negative.        Objective:   Physical Exam  Constitutional: She is oriented to person, place, and time. She appears well-developed and well-nourished.  HENT:  Nose: Right sinus exhibits maxillary sinus tenderness and frontal sinus tenderness. Left sinus exhibits maxillary sinus tenderness and frontal sinus tenderness.  Mouth/Throat: Posterior oropharyngeal erythema present.  Cardiovascular: Normal rate, regular rhythm and normal heart sounds.   No murmur heard. Pulmonary/Chest: Effort normal. No respiratory distress. She has wheezes. She exhibits no tenderness.  Expiratory wheezing intermittently Coughing during exam  Neurological: She is alert and oriented to person, place, and time.  Skin: Skin is warm and dry.  Psychiatric: She has a normal mood and affect.   Results for orders placed in visit on 02/07/13  POCT RAPID STREP A (OFFICE)      Result Value Range   Rapid Strep A Screen Negative  Negative  POCT UA - MICROSCOPIC ONLY      Result Value Range   WBC, Ur, HPF, POC 5-10     RBC, urine, microscopic 1-5     Bacteria, U Microscopic neg     Mucus, UA neg     Epithelial cells, urine per micros occ     Crystals, Ur, HPF, POC neg     Casts, Ur, LPF, POC neg     Yeast, UA neg    POCT URINALYSIS DIPSTICK      Result Value Range   Color, UA yellow     Clarity, UA clear     Glucose, UA neg     Bilirubin, UA neg     Ketones, UA neg  Spec Grav, UA <=1.005     Blood, UA trace     pH, UA 7.0     Protein, UA neg     Urobilinogen, UA negative     Nitrite, UA neg     Leukocytes, UA small (1+)           Assessment & Plan:  Complete antibiotics even if feeling better Take medications as prescribed Increase fluid intake Rest Proper handwashing Humidify air if possible RTO if symptoms worsen or unresolved   Raymon Mutton, FNP-C

## 2013-02-07 NOTE — Telephone Encounter (Signed)
APPT MADE

## 2013-02-08 NOTE — Progress Notes (Deleted)
  Subjective:    Patient ID: [name], female    DOB: 1961/10/01, 52 y.o.   MRN: [id number]  Fever  Associated symptoms include coughing.  Cough Associated symptoms include a fever.  Sore Throat  Associated symptoms include coughing.      Review of Systems  Constitutional: Positive for fever.  Respiratory: Positive for cough.        Objective:   Physical Exam        Assessment & Plan:

## 2013-02-08 NOTE — Progress Notes (Deleted)
  Subjective:    Patient ID: [name], female    DOB: [birth year], 52 y.o.   MRN: [id number]  HPI Patient present today with cough, fever, nasal congestion and sore throat times 3 days. All symptoms have gradually worsened. Reports coughing every hour and soreness in bilateral mid chest area. Reports taking OTC cold medications without relief. Reports temperature of 101, tylenol taking with minimal relief. Reports several members of the household have similar symptoms and are in the process of seeking medical attention.     Review of Systems  Constitutional: Positive for fever. Negative for chills.  HENT: Positive for congestion, rhinorrhea, postnasal drip and sinus pressure.   Eyes: Negative for pain.  Respiratory: Positive for cough. Negative for chest tightness and shortness of breath.   Cardiovascular: Negative for chest pain and palpitations.  Genitourinary: Negative for difficulty urinating.  Musculoskeletal: Negative for myalgias.  Skin: Negative for rash.  Neurological: Negative for dizziness.  Psychiatric/Behavioral: Negative.        Objective:   Physical Exam  Constitutional: She is oriented to person, place, and time. She appears well-developed and well-nourished.  HENT:  Head: Normocephalic and atraumatic.  Right Ear: External ear normal.  Left Ear: External ear normal.  Nose: Right sinus exhibits maxillary sinus tenderness and frontal sinus tenderness. Left sinus exhibits maxillary sinus tenderness and frontal sinus tenderness.  Mouth/Throat: Posterior oropharyngeal erythema present.  Cardiovascular: Normal rate, regular rhythm and normal heart sounds.   No murmur heard. Pulmonary/Chest: Effort normal. No respiratory distress. She exhibits tenderness.  Patient coughing upon examination, non productive Negative for respiratory distress  Neurological: She is alert and oriented to person, place, and time.  Skin: Skin is warm. No rash noted.  Psychiatric: She has a  normal mood and affect.          Assessment & Plan:

## 2013-02-08 NOTE — Progress Notes (Addendum)
Subjective:     Patient ID: [name], female   DOB: [birth year], 52 y.o.   MRN: [id number]  HPI Patient presents today with cough, nasal congestion and sore throat. Cough times one week. Reports other family members are sick within the household. Reports taking OTC cold and sinus without relief. Reports nasal drainage and productive cough with yellowish sputum. Reports facial pressure at forehead and cheeks. Reports having a fever, but didn't check temperature.   Review of Systems  Constitutional: Positive for fever and chills.  HENT: Positive for sneezing, postnasal drip and sinus pressure. Negative for ear pain and facial swelling.   Respiratory: Negative for chest tightness and shortness of breath.   Cardiovascular: Negative for chest pain and palpitations.  Genitourinary: Negative for difficulty urinating.  Musculoskeletal:       Muscle soreness in chest area with coughing  Neurological: Positive for headaches. Negative for dizziness.       With coughing  Psychiatric/Behavioral: Negative.        Objective:   Physical Exam  Constitutional: She is oriented to person, place, and time. She appears well-developed and well-nourished.  HENT:  Nose: Right sinus exhibits maxillary sinus tenderness and frontal sinus tenderness. Left sinus exhibits maxillary sinus tenderness and frontal sinus tenderness.  Mouth/Throat: Posterior oropharyngeal erythema present.  Cardiovascular: Normal rate, regular rhythm and normal heart sounds.   No murmur heard. Pulmonary/Chest: Effort normal. She has wheezes. She exhibits no tenderness.  Slight expiratory wheezing  Abdominal: She exhibits no distension. There is no tenderness. There is no rebound.  Neurological: She is alert and oriented to person, place, and time.  Skin: Skin is warm. No rash noted.  Psychiatric: She has a normal mood and affect.       Assessment:    sinusitis Cough Bronchitis [facility] reading (PRIMARY) by Ruthell Rummage, FNP-C,  no acute diesease process                                  Plan:    Continue antibiotics even if feeling better   Take medications are prescribed Increase fluid intake New toothbrush in 3 days Proper handwashing RTO if symptoms worsen or unresolved Patient verbalized understanding and denies any questions  Raymon Mutton, FNP-C

## 2013-02-10 ENCOUNTER — Ambulatory Visit: Payer: Medicare Other | Admitting: Gastroenterology

## 2013-02-20 ENCOUNTER — Other Ambulatory Visit: Payer: Self-pay | Admitting: Nurse Practitioner

## 2013-03-14 ENCOUNTER — Ambulatory Visit (INDEPENDENT_AMBULATORY_CARE_PROVIDER_SITE_OTHER): Payer: Medicare Other | Admitting: Gastroenterology

## 2013-03-14 ENCOUNTER — Encounter: Payer: Self-pay | Admitting: Gastroenterology

## 2013-03-14 VITALS — BP 100/70 | HR 60 | Ht 64.0 in | Wt 163.4 lb

## 2013-03-14 DIAGNOSIS — K3184 Gastroparesis: Secondary | ICD-10-CM

## 2013-03-14 DIAGNOSIS — K5900 Constipation, unspecified: Secondary | ICD-10-CM

## 2013-03-14 DIAGNOSIS — R569 Unspecified convulsions: Secondary | ICD-10-CM

## 2013-03-14 NOTE — Patient Instructions (Addendum)
Consider trying miralax (OTC) powder, daily. Should give it at least 5-7 days before judging if it works.  Try 4-5 smaller meals per day rather than 2 bigger ones.  Continue cutting back on pain medicine.   You have been referred to [facility] for your history of seizures. Their office is located at [address], [place], [place] [address]. Their phone number is [phone] should you need to contact them. We will contact you when we have been given an appointment time and date.

## 2013-03-14 NOTE — Progress Notes (Signed)
Review of pertinent gastrointestinal problems:  1. Routine risk for colon cancer. Colonoscopy March 2013 found mild diverticulosis only. Recommended 10 year recall colonoscopy  2. intermittent abdominal pains, felt possibly due to 4-5 cm cystic lesion in liver. She underwent wedge resection of liver 03/2012 and these were found to be hamartomas. Her gallbladder was also removed at the same time.  3. EGD for mild dysphasia, dyspepsia March 2013 showed mild gastritis that was H. pylori negative. 4. EGD 11/2012 for weight loss, post prandial discomfort showed mild gastritis, biopsies showed no H. Pylori.  10/2012 CT scan Of abdomen and pelvis with IV and oral contrast last month was normal  CBC, complete metabolic profile last month were normal.  GES afterwards showed slow gastric emptying (felt possibly due to narcotic pain meds).  Was advised to cut down narcotics.  HPI: This is a   pleasant 52 year old woman whom I last saw 2-3 months ago. She is here with a friend of hers.  Colonoscopy 01/2012,  EGD 01/2012 and 11/2012. CT scan 10/2012.  Lap surgery 03/2012.   She has been a bit constipated, can lead to nausea, vomiting.  Has no appetite.  Takes colace daily.  Sometimes two colace.  Periodically takes correctal.  SHe has cut back on narcotic pain meds (down to 2-3 pills per day rather than 4 pills per day).    Past Medical History  Diagnosis Date  . Liver lesion   . Hypertension   . Breast changes, fibrocystic   . Idiopathic thrombocytopenia     as child  . Arthritis   . Clotting disorder      itp , none since 1976, no current hematologist  . GERD (gastroesophageal reflux disease)   . Hyperlipidemia   . Osteoporosis   . Nasal congestion   . Trouble swallowing   . Leg swelling     both ankles  . Abdominal pain   . Rectal bleeding   . Blood in stool   . Unintentional weight loss   . Constipation   . Nausea & vomiting   . Generalized headaches   . Weakness     weakness varies  .  Bruises easily   . Anxiety   . Depression   . DDD (degenerative disc disease), cervical     with lumbar issues  . Seizures 1998    stress induced, one time, none since, no seizure meds  . Neuromuscular disorder     back  injury  . Neuromuscular disorder     3 neck surgeries,neck fusion.pin,plates,screws  . Panic attacks     pt also  has germaphobia  . Hemorrhoid   . Spleen enlarged 1976, none since    r/t idiopathic thrombocytopenia  . Spleen enlarged 1976  . Spleen enlarged 1976  . Sleep apnea     STOPBANG=4    Past Surgical History  Procedure Laterality Date  . Cervical spine surgery  2005, 2007, 2010    x3, fusion done,plates and screws present  . Cesarean section  1988, 1994  . Abdominal hysterectomy  1997  . Colonscopy and esophagogastrodudononescopy  02-04-12  . Cholecystectomy  03/14/2012    Procedure: LAPAROSCOPIC CHOLECYSTECTOMY;  Surgeon: Almond Lint, MD;  Location: [facility];  Service: General;  Laterality: N/A;    Current Outpatient Prescriptions  Medication Sig Dispense Refill  . albuterol (PROVENTIL HFA;VENTOLIN HFA) 108 (90 BASE) MCG/ACT inhaler Inhale 2 puffs into the lungs every 6 (six) hours as needed for wheezing.  1 Inhaler  0  .  ALPRAZolam (XANAX) 1 MG tablet Take 0.5 mg by mouth 5 (five) times daily.       Marland Kitchen atenolol (TENORMIN) 50 MG tablet Take 50 mg by mouth daily with breakfast.       . citalopram (CELEXA) 40 MG tablet Take 40 mg by mouth daily with breakfast.       . cyclobenzaprine (FLEXERIL) 10 MG tablet Take 10 mg by mouth 3 (three) times daily as needed. For muscle spasms      . docusate sodium (COLACE) 100 MG capsule Take 100 mg by mouth at bedtime.      Marland Kitchen HYDROcodone-acetaminophen (NORCO) 10-325 MG per tablet Take 1-2 tablets by mouth every 6 (six) hours as needed for pain. For pain  60 tablet  0  . lisinopril-hydrochlorothiazide (PRINZIDE,ZESTORETIC) 20-25 MG per tablet TAKE ONE TABLET BY MOUTH EVERY DAY  90 tablet  0  . pantoprazole (PROTONIX)  40 MG tablet Take 1 tablet (40 mg total) by mouth 2 (two) times daily.  180 tablet  3  . traZODone (DESYREL) 100 MG tablet Take 200 mg by mouth at bedtime. Takes 200 mg at bedtime      . [DISCONTINUED] hydrochlorothiazide (HYDRODIURIL) 25 MG tablet Take 25 mg by mouth daily.       No current facility-administered medications for this visit.    Allergies as of 03/14/2013 - Review Complete 03/14/2013  Allergen Reaction Noted  . Nsaids Nausea Only 01/06/2012    Family History  Problem Relation Age of Onset  . Heart disease Maternal Grandmother   . Hypertension Mother   . Heart disease Mother   . Diabetes Sister   . Lung cancer Father   . Cancer Father     lung  . Colon cancer Paternal Grandmother   . Cancer Paternal Grandmother     colon    History   Social History  . Marital Status: Divorced    Spouse Name: N/A    Number of Children: 3  . Years of Education: N/A   Occupational History  . Diabled    Social History Main Topics  . Smoking status: Former Smoker -- 1.00 packs/day for 5 years    Types: Cigarettes    Quit date: 12/29/2011  . Smokeless tobacco: Never Used  . Alcohol Use: No  . Drug Use: No  . Sexually Active: Not on file   Other Topics Concern  . Not on file   Social History Narrative  . No narrative on file      Physical Exam: BP 100/70  Pulse 60  Ht 5\' 4"  (1.626 m)  Wt 163 lb 6.4 oz (74.118 kg)  BMI 28.03 kg/m2 Constitutional: generally well-appearing Psychiatric: alert and oriented x3 Abdomen: soft, nontender, nondistended, no obvious ascites, no peritoneal signs, normal bowel sounds     Assessment and plan: 52 y.o. female with gastroparesis, constipation  Both of these are likely influenced by her narcotic pain medicines which she is trying to wean herself off of. I recommended she try MiraLax on a daily basis for her chronic constipation. She does not need any further GI workup. She asked for a neurology referral for her seizures and  I am happy to help her with that.

## 2013-03-23 ENCOUNTER — Telehealth: Payer: Self-pay | Admitting: Gastroenterology

## 2013-03-23 NOTE — Telephone Encounter (Signed)
[facility] has the referral records are being reviewed and will call the pt as soon as appt has been made

## 2013-03-23 NOTE — Telephone Encounter (Signed)
Pt is aware that the office will contact her as soon as available

## 2013-03-24 ENCOUNTER — Ambulatory Visit (INDEPENDENT_AMBULATORY_CARE_PROVIDER_SITE_OTHER): Payer: Medicare Other | Admitting: Neurology

## 2013-03-24 ENCOUNTER — Encounter: Payer: Self-pay | Admitting: Neurology

## 2013-03-24 VITALS — BP 112/76 | HR 66 | Ht 62.5 in | Wt 164.0 lb

## 2013-03-24 DIAGNOSIS — R569 Unspecified convulsions: Secondary | ICD-10-CM

## 2013-03-24 NOTE — Progress Notes (Addendum)
History: [name] is a 52 yo RH WF accompanied by her boyfriend, referred by her primary care physician for evaluation of staring spells  She has past medical history of depression, anxiety, fibromyalgia, hypertension, migraine headaches, previous multiple cervical decompression, and fusion surgery,  She reported a history of generalized seizure in 1998, under extreme family stress, depression, anxiety, and panic attack  She no longer has generalized seizure, but since 2013, while she experienced a lot of stress again, she began to have episodes of staring, staring into space, lasting 10-15 minutes, no seizure-like activity, no tongue biting, no incontinence, whole-body tremor, it can happen multiple episodes in a day,  She also complains of chronic constipation, memory loss, more frequent headaches, 2-3 times each week, occipital region, pressure headaches, she denies visual loss,    Review of Systems  Out of a complete 14 system review, the patient complains of only the following symptoms, and all other reviewed systems are negative.   Constitutional:   Weight loss Cardiovascular:  N/A Ear/Nose/Throat:  N/A Skin: birth mark Eyes: N/A Respiratory: N/A Gastroitestinal: N/A    Hematology/Lymphatic:  N/A Endocrine:  Feeling hot Musculoskeletal: joint pain Allergy/Immunology: N/A Neurological: memory loss, confusion, headache, numbness, weakness, slurred speech, insomnia, sleepiness, seizure, passingout, tremor Psychiatric:    Depression, anxiety, change in appetite, racing thought  PHYSICAL EXAMINATOINS:  Generalized: In no acute distress  Neck: Supple, no carotid bruits   Cardiac: Regular rate rhythm  Pulmonary: Clear to auscultation bilaterally  Musculoskeletal: No deformity  Neurological examination  Mentation: Alert oriented to time, place, history taking, and causual conversation  Cranial nerve II-XII: Pupils were equal round reactive to light extraocular movements were  full, visual field were full on confrontational test. facial sensation and strength were normal. hearing was intact to finger rubbing bilaterally. Uvula tongue midline.  head turning and shoulder shrug and were normal and symmetric.Tongue protrusion into cheek strength was normal.  Motor: normal tone, bulk and strength.  Sensory: Intact to fine touch, pinprick, preserved vibratory sensation, and proprioception at toes.  Coordination: Normal finger to nose, heel-to-shin bilaterally there was no truncal ataxia  Gait: Rising up from seated position without assistance, normal stance, without trunk ataxia, moderate stride, good arm swing, smooth turning, she has mild difficulty with tandem walking Romberg signs: Negative  Deep tendon reflexes: Brachioradialis 2/2, biceps 2/2, triceps 2/2, patellar 2/2, Achilles 2/2, plantar responses were flexor bilaterally.  Assessment and plan: 52 years old right-handed female, presenting with one year history of frequent staring spells, induced by stress, essentially normal neurological examination, 1, the possibility over complains including depression, anxiety, less likely seizure 2 but will proceed with evaluation with MRI of the brain, EEG. 3. Return to clinic in 6 months with [name]

## 2013-03-28 ENCOUNTER — Ambulatory Visit (INDEPENDENT_AMBULATORY_CARE_PROVIDER_SITE_OTHER): Payer: Medicare Other | Admitting: Radiology

## 2013-03-28 DIAGNOSIS — R569 Unspecified convulsions: Secondary | ICD-10-CM

## 2013-03-29 NOTE — Procedures (Signed)
HISTORY: 52 years old Caucasian female, with starring episode,  TECHNIQUE:  16 channel EEG was performed based on standard 10-16 international system. One channel was dedicated to EKG, which has demonstrates normal sinus rhythm of 72 beats per minutes.  Upon awakening, the posterior background activity was well-developed, with mixed alpha and beta range activities, with amplitude of 40 microvoltage, reactive to eye opening and closure.  There was no evidence of epilepsy for discharge.  Photic stimulation was performed, which induced a symmetric photic driving.  Hyperventilation was performed, there was no abnormality elicit.  No sleep was achieved.  CONCLUSION: This is a  normal EEG.  There is no electrodiagnostic evidence of epileptiform discharge. The increased beta frequency activity can be attributed to her Xanax use

## 2013-03-30 ENCOUNTER — Other Ambulatory Visit: Payer: Self-pay | Admitting: Family Medicine

## 2013-04-10 ENCOUNTER — Ambulatory Visit
Admission: RE | Admit: 2013-04-10 | Discharge: 2013-04-10 | Disposition: A | Discharge status: Home / Self Care | Payer: Medicare Other | Source: Ambulatory Visit | Attending: Neurology | Admitting: Neurology

## 2013-04-10 DIAGNOSIS — R569 Unspecified convulsions: Secondary | ICD-10-CM

## 2013-04-10 MED ORDER — GADOBENATE DIMEGLUMINE 529 MG/ML IV SOLN
15.0000 mL | Freq: Once | INTRAVENOUS | Status: AC | PRN
  Administered 2013-04-10: 15 mL via INTRAVENOUS

## 2013-04-14 ENCOUNTER — Telehealth: Payer: Self-pay | Admitting: *Deleted

## 2013-04-14 NOTE — Progress Notes (Signed)
Quick Note:  Fail to reach patient, please call her again, MRI brain only nonspecific findings, please check with her to see if she had any signs of sinus infection, such as fever, headaches, drainage, if not, continue to observe her symptoms, if she has signs of active infection, she should contact her primary care  EEG was normal ______

## 2013-04-14 NOTE — Telephone Encounter (Signed)
I called and left a message that the patient's EEG result was normal.

## 2013-05-15 ENCOUNTER — Telehealth: Payer: Self-pay | Admitting: *Deleted

## 2013-05-15 NOTE — Telephone Encounter (Signed)
Received phone call on previsit phone room 51, from 05/11/13. Message from patient left that she was calling about an appointment she had been trying to have for 2 years??? Returned call to patient to see if I could discern message and understand what she was calling about. No answer. Message left that is she needed assistance to call the [phone] number for assistance.

## 2013-05-17 ENCOUNTER — Other Ambulatory Visit: Payer: Self-pay | Admitting: Gastroenterology

## 2013-05-23 ENCOUNTER — Other Ambulatory Visit: Payer: Self-pay | Admitting: Orthopaedic Surgery

## 2013-05-23 DIAGNOSIS — M546 Pain in thoracic spine: Secondary | ICD-10-CM

## 2013-05-23 DIAGNOSIS — M542 Cervicalgia: Secondary | ICD-10-CM

## 2013-05-27 ENCOUNTER — Ambulatory Visit
Admission: RE | Admit: 2013-05-27 | Discharge: 2013-05-27 | Disposition: A | Discharge status: Home / Self Care | Payer: Medicare Other | Source: Ambulatory Visit | Attending: Orthopaedic Surgery | Admitting: Orthopaedic Surgery

## 2013-05-27 DIAGNOSIS — M542 Cervicalgia: Secondary | ICD-10-CM

## 2013-05-27 DIAGNOSIS — M546 Pain in thoracic spine: Secondary | ICD-10-CM

## 2013-06-20 ENCOUNTER — Telehealth: Payer: Self-pay | Admitting: Nurse Practitioner

## 2013-06-22 ENCOUNTER — Other Ambulatory Visit: Payer: Self-pay

## 2013-06-22 MED ORDER — ATENOLOL 50 MG PO TABS: 50.0000 mg | ORAL_TABLET | Freq: Every day | ORAL | Status: DC

## 2013-08-20 ENCOUNTER — Other Ambulatory Visit: Payer: Self-pay | Admitting: Gastroenterology

## 2013-09-20 ENCOUNTER — Other Ambulatory Visit: Payer: Self-pay | Admitting: Family Medicine

## 2013-09-25 ENCOUNTER — Ambulatory Visit: Payer: Medicare Other | Admitting: Nurse Practitioner

## 2013-10-06 ENCOUNTER — Other Ambulatory Visit: Payer: Self-pay | Admitting: Family Medicine

## 2013-10-24 ENCOUNTER — Ambulatory Visit (INDEPENDENT_AMBULATORY_CARE_PROVIDER_SITE_OTHER): Payer: Medicare Other | Admitting: Family Medicine

## 2013-10-24 ENCOUNTER — Other Ambulatory Visit: Payer: Self-pay | Admitting: Family Medicine

## 2013-10-24 ENCOUNTER — Encounter: Payer: Self-pay | Admitting: Family Medicine

## 2013-10-24 VITALS — BP 115/79 | HR 79 | Temp 96.9°F | Ht 62.0 in | Wt 189.0 lb

## 2013-10-24 DIAGNOSIS — E559 Vitamin D deficiency, unspecified: Secondary | ICD-10-CM

## 2013-10-24 DIAGNOSIS — M810 Age-related osteoporosis without current pathological fracture: Secondary | ICD-10-CM

## 2013-10-24 DIAGNOSIS — R5381 Other malaise: Secondary | ICD-10-CM

## 2013-10-24 DIAGNOSIS — N951 Menopausal and female climacteric states: Secondary | ICD-10-CM

## 2013-10-24 DIAGNOSIS — E785 Hyperlipidemia, unspecified: Secondary | ICD-10-CM

## 2013-10-24 DIAGNOSIS — Z780 Asymptomatic menopausal state: Secondary | ICD-10-CM

## 2013-10-24 DIAGNOSIS — R300 Dysuria: Secondary | ICD-10-CM

## 2013-10-24 DIAGNOSIS — I10 Essential (primary) hypertension: Secondary | ICD-10-CM

## 2013-10-24 DIAGNOSIS — Z0000 Encounter for general adult medical examination without abnormal findings: Secondary | ICD-10-CM

## 2013-10-24 DIAGNOSIS — R350 Frequency of micturition: Secondary | ICD-10-CM

## 2013-10-24 LAB — POCT CBC
Granulocyte percent: 59.1 %G (ref 37–80)
HCT, POC: 44.6 % (ref 37.7–47.9)
Hemoglobin: 14.7 g/dL (ref 12.2–16.2)
Lymph, poc: 4.2 — AB (ref 0.6–3.4)
MCH, POC: 29.2 pg (ref 27–31.2)
MCHC: 32.9 g/dL (ref 31.8–35.4)
MCV: 88.8 fL (ref 80–97)
MPV: 8.2 fL (ref 0–99.8)
POC Granulocyte: 7.2 — AB (ref 2–6.9)
POC LYMPH PERCENT: 34.4 %L (ref 10–50)
Platelet Count, POC: 280 10*3/uL (ref 142–424)
RBC: 5 M/uL (ref 4.04–5.48)
RDW, POC: 13.3 %
WBC: 12.1 10*3/uL — AB (ref 4.6–10.2)

## 2013-10-24 LAB — POCT UA - MICROSCOPIC ONLY
Bacteria, U Microscopic: NEGATIVE
Casts, Ur, LPF, POC: NEGATIVE
Crystals, Ur, HPF, POC: NEGATIVE
Mucus, UA: NEGATIVE
RBC, urine, microscopic: NEGATIVE
WBC, Ur, HPF, POC: NEGATIVE
Yeast, UA: NEGATIVE

## 2013-10-24 LAB — POCT URINALYSIS DIPSTICK
Bilirubin, UA: NEGATIVE
Blood, UA: NEGATIVE
Glucose, UA: NEGATIVE
Ketones, UA: NEGATIVE
Leukocytes, UA: NEGATIVE
Nitrite, UA: NEGATIVE
Protein, UA: NEGATIVE
Spec Grav, UA: 1.02
Urobilinogen, UA: NEGATIVE
pH, UA: 5

## 2013-10-24 MED ORDER — LISINOPRIL-HYDROCHLOROTHIAZIDE 20-25 MG PO TABS: 1.0000 | ORAL_TABLET | Freq: Every day | ORAL | Status: DC

## 2013-10-24 MED ORDER — OXYBUTYNIN CHLORIDE ER 10 MG PO TB24: 10.0000 mg | ORAL_TABLET | Freq: Every day | ORAL | Status: DC

## 2013-10-24 MED ORDER — ATENOLOL 50 MG PO TABS: 50.0000 mg | ORAL_TABLET | Freq: Every day | ORAL | Status: DC

## 2013-10-24 NOTE — Patient Instructions (Signed)

## 2013-10-24 NOTE — Progress Notes (Signed)
   Subjective:    Patient ID: [name], female    DOB: 1961-07-06, 52 y.o.   MRN: [id number]  HPI This 52 y.o. female presents for evaluation of CPE without pap.  She has hx of total hysterectomy. She has hx of sz disorder and takes neurontin and does not have anymore sz.  She has hx of hypertension.  She has hx of DDD of the cervical spine and lumbar spine and has chronic pain. She has hx of osteoporosis.  She has hx of vitamin D deficiency.  She has been gaining weight and she Has hx of depression.  She has not had mammo in some time.  She has not had Cpe labs. She is a smoker.  She would like to quit.   Review of Systems C/o arthralgias, neck and back pain, and fatigue   No chest pain, SOB, HA, dizziness, vision change, N/V, diarrhea, constipation, dysuria, urinary urgency or frequency or rash.  Objective:   Physical Exam  Vital signs noted  Well developed well nourished female.  HEENT - Head atraumatic Normocephalic                Eyes - PERRLA, Conjuctiva - clear Sclera- Clear EOMI                Ears - EAC's Wnl TM's Wnl Gross Hearing WNL                Nose - Nares patent                 Throat - oropharanx wnl Respiratory - Lungs CTA bilateral Cardiac - RRR S1 and S2 without murmur GI - Abdomen soft Nontender and bowel sounds active x 4 Extremities - No edema. Neuro - Grossly intact.      Assessment & Plan:  Dysuria - Plan: POCT urinalysis dipstick, POCT UA - Microscopic Only, Vit D  25 hydroxy (rtn osteoporosis monitoring), Vitamin B12, oxybutynin (DITROPAN XL) 10 MG 24 hr tablet  Routine general medical examination at a health care facility - Plan: POCT CBC, CMP14+EGFR, Lipid panel, Vit D  25 hydroxy (rtn osteoporosis monitoring), Thyroid Panel With TSH, Vitamin B12  Menopause - Plan: Vit D  25 hydroxy (rtn osteoporosis monitoring), Vitamin B12, DG Bone Density  Unspecified essential hypertension - Plan: lisinopril-hydrochlorothiazide (PRINZIDE,ZESTORETIC)  20-25 MG per tablet, atenolol (TENORMIN) 50 MG tablet, Vit D  25 hydroxy (rtn osteoporosis monitoring), Vitamin B12  Other malaise and fatigue - Plan: POCT CBC, Vit D  25 hydroxy (rtn osteoporosis monitoring), Vitamin B12  Osteoporosis, unspecified - Plan: Vit D  25 hydroxy (rtn osteoporosis monitoring), Vitamin B12, DG Bone Density  Unspecified vitamin D deficiency - Plan: Vit D  25 hydroxy (rtn osteoporosis monitoring), Vitamin B12  Other and unspecified hyperlipidemia - Plan: Lipid panel, Vit D  25 hydroxy (rtn osteoporosis monitoring), Vitamin B12  Deatra Canter FNP

## 2013-10-25 ENCOUNTER — Other Ambulatory Visit: Payer: Self-pay | Admitting: Family Medicine

## 2013-10-25 LAB — THYROID PANEL WITH TSH
Free Thyroxine Index: 2.3 (ref 1.2–4.9)
T3 Uptake Ratio: 26 % (ref 24–39)
T4, Total: 8.7 ug/dL (ref 4.5–12.0)
TSH: 2.11 u[IU]/mL (ref 0.450–4.500)

## 2013-10-25 LAB — CMP14+EGFR
ALT: 37 IU/L — ABNORMAL HIGH (ref 0–32)
AST: 36 IU/L (ref 0–40)
Albumin/Globulin Ratio: 1.8 (ref 1.1–2.5)
Albumin: 4.3 g/dL (ref 3.5–5.5)
Alkaline Phosphatase: 107 IU/L (ref 39–117)
BUN/Creatinine Ratio: 26 — ABNORMAL HIGH (ref 9–23)
BUN: 18 mg/dL (ref 6–24)
CO2: 31 mmol/L — ABNORMAL HIGH (ref 18–29)
Calcium: 9.9 mg/dL (ref 8.7–10.2)
Chloride: 95 mmol/L — ABNORMAL LOW (ref 97–108)
Creatinine, Ser: 0.69 mg/dL (ref 0.57–1.00)
GFR calc Af Amer: 114 mL/min/{1.73_m2} (ref >59)
GFR calc non Af Amer: 99 mL/min/{1.73_m2} (ref >59)
Globulin, Total: 2.4 g/dL (ref 1.5–4.5)
Glucose: 88 mg/dL (ref 65–99)
Potassium: 4.1 mmol/L (ref 3.5–5.2)
Sodium: 138 mmol/L (ref 134–144)
Total Bilirubin: <0.2 mg/dL (ref 0.0–1.2)
Total Protein: 6.7 g/dL (ref 6.0–8.5)

## 2013-10-25 LAB — LIPID PANEL
Chol/HDL Ratio: 6.6 ratio units — ABNORMAL HIGH (ref 0.0–4.4)
Cholesterol, Total: 264 mg/dL — ABNORMAL HIGH (ref 100–199)
HDL: 40 mg/dL (ref >39)
Triglycerides: 457 mg/dL — ABNORMAL HIGH (ref 0–149)

## 2013-10-25 LAB — SPECIMEN STATUS REPORT

## 2013-10-25 LAB — VITAMIN D 25 HYDROXY (VIT D DEFICIENCY, FRACTURES): Vit D, 25-Hydroxy: 17.6 ng/mL — ABNORMAL LOW (ref 30.0–100.0)

## 2013-10-25 LAB — VITAMIN B12: Vitamin B-12: 420 pg/mL (ref 211–946)

## 2013-10-25 MED ORDER — VITAMIN D (ERGOCALCIFEROL) 1.25 MG (50000 UNIT) PO CAPS: 50000.0000 [IU] | ORAL_CAPSULE | ORAL | Status: DC

## 2013-10-25 MED ORDER — FENOFIBRATE 48 MG PO TABS: 48.0000 mg | ORAL_TABLET | Freq: Every day | ORAL | Status: DC

## 2013-11-16 ENCOUNTER — Other Ambulatory Visit: Payer: Self-pay | Admitting: Gastroenterology

## 2013-11-20 ENCOUNTER — Telehealth: Payer: Self-pay | Admitting: Family Medicine

## 2013-11-20 ENCOUNTER — Other Ambulatory Visit: Payer: Self-pay | Admitting: Family Medicine

## 2013-11-20 MED ORDER — AMOXICILLIN-POT CLAVULANATE 875-125 MG PO TABS: 1.0000 | ORAL_TABLET | Freq: Two times a day (BID) | ORAL | Status: DC

## 2013-11-20 NOTE — Telephone Encounter (Signed)
Augmentin 875mg  one po bid x 10 days sent to the pharmacy

## 2013-11-21 NOTE — Telephone Encounter (Signed)
Pt.notified

## 2013-11-22 ENCOUNTER — Other Ambulatory Visit: Payer: Self-pay | Admitting: Family Medicine

## 2013-11-22 MED ORDER — AZITHROMYCIN 250 MG PO TABS: ORAL_TABLET | ORAL | Status: DC

## 2013-11-22 NOTE — Telephone Encounter (Signed)
Message copied by Priscille Heidelberg on Wed Nov 22, 2013  4:24 PM ------      Message from: Lysbeth Penner      Created: Wed Nov 22, 2013  1:11 PM       Sent rx for zpak ------

## 2013-12-06 ENCOUNTER — Encounter: Payer: Medicare Other | Admitting: Family Medicine

## 2013-12-15 ENCOUNTER — Ambulatory Visit: Payer: Medicare Other | Admitting: Nurse Practitioner

## 2013-12-20 ENCOUNTER — Ambulatory Visit (INDEPENDENT_AMBULATORY_CARE_PROVIDER_SITE_OTHER): Payer: Medicare Other

## 2013-12-20 ENCOUNTER — Encounter: Payer: Self-pay | Admitting: Pharmacist

## 2013-12-20 ENCOUNTER — Ambulatory Visit (INDEPENDENT_AMBULATORY_CARE_PROVIDER_SITE_OTHER): Payer: Medicare Other | Admitting: Pharmacist

## 2013-12-20 VITALS — Ht 63.0 in | Wt 186.0 lb

## 2013-12-20 DIAGNOSIS — N951 Menopausal and female climacteric states: Secondary | ICD-10-CM

## 2013-12-20 DIAGNOSIS — M949 Disorder of cartilage, unspecified: Secondary | ICD-10-CM

## 2013-12-20 DIAGNOSIS — M8580 Other specified disorders of bone density and structure, unspecified site: Secondary | ICD-10-CM | POA: Insufficient documentation

## 2013-12-20 DIAGNOSIS — M899 Disorder of bone, unspecified: Secondary | ICD-10-CM

## 2013-12-20 DIAGNOSIS — M810 Age-related osteoporosis without current pathological fracture: Secondary | ICD-10-CM

## 2013-12-20 DIAGNOSIS — Z780 Asymptomatic menopausal state: Secondary | ICD-10-CM

## 2013-12-20 NOTE — Progress Notes (Signed)
Patient ID: [name], female   DOB: Aug 21, 1961, 53 y.o.   MRN: [id number]  [facility] Current Height: Height: 5\' 3"  (160 cm)      Max Lifetime Height:  5\' 4"  Current Weight: Weight: 186 lb (84.369 kg)       Ethnicity:Caucasian    HPI: Patient here today for first DEXA Does pt already have a diagnosis of:  Osteopenia?  No Osteoporosis?  No  Back Pain?  Yes - history of DDD and multiple surgeries on spine    Kyphosis?  No Prior fracture?  No Med(s) for Osteoporosis/Osteopenia:  Vitamin supplement Med(s) previously tried for Osteoporosis/Osteopenia:  none                                                             PMH: Age at menopause:  53yo - surgical Hysterectomy?  Yes Oophorectomy?  Yes HRT? No Steroid Use?  No Thyroid med?  No History of cancer?  No History of digestive disorders (ie Crohn's)?  Yes - H. Pylori and GERD - is currently taking daily PPI Current or previous eating disorders?  No Last Vitamin D Result:  17.6 (10/18/2013) Last GFR Result:  99 (10/24/2013)   FH/SH: Family history of osteoporosis?  Yes - mother Parent with history of hip fracture?  No Family history of breast cancer?  No Exercise?  No - not currently but plans to restart walking daily when ok's by surgeon Smoking?  Yes - she started just 4 years ago and had stopped but has restarted Alcohol?  No    Calcium Assessment Calcium Intake  # of servings/day  Calcium mg  Milk (8 oz) 2  x  300  = 600mg   Yogurt (4 oz) 0.5 x  200 = 100mg   Cheese (1 oz) 1 x  200 = 200mg   Other Calcium sources   250mg   Ca supplement 0 = 0   Estimated calcium intake per day 1150mg     DEXA Results Date of Test T-Score for AP Spine L1-L4 T-Score for Total Left Hip T-Score for Total Right Hip  12/20/2013 -2.1 -1.0 -1.3                  FRAX 10 year estimate: Total FX risk:  7.5%  (consider medication if >/= 20%) Hip FX risk:  1.6% (consider medication if >/= 3%)  Assessment: Osteopenia Vitamin  D deficiency - currently on supplementation is due to recheck next month  Recommendations: 1.  Discussed BMD results and fracture risk.  Specifically discussed modifiable risk factors such as smoking, not exercising, vitamin D deficiency and low calcium intake 2.  recommend calcium 1200mg  daily through supplementation or diet.  3.  recommend weight bearing exercise - 30 minutes at least 4 days per week.   4.  Counseled and educated about fall risk and prevention. 5.  Discussed smoking cessation and benefits on all over health.  Patient has quit and past and believes she can again but not ready at this time.  Recheck DEXA:  2 years  Time spent counseling patient:  30 minutes  Cherre Robins, PharmD, CPP

## 2014-01-11 ENCOUNTER — Other Ambulatory Visit: Payer: Self-pay | Admitting: Family Medicine

## 2014-01-11 NOTE — Telephone Encounter (Signed)
Last seen 10/24/13 [facility]  Vit D 10/24/13  17.6 low

## 2014-01-23 ENCOUNTER — Other Ambulatory Visit: Payer: Self-pay | Admitting: Family Medicine

## 2014-01-24 ENCOUNTER — Other Ambulatory Visit: Payer: Self-pay | Admitting: Gastroenterology

## 2014-02-28 ENCOUNTER — Other Ambulatory Visit: Payer: Self-pay | Admitting: Orthopaedic Surgery

## 2014-02-28 DIAGNOSIS — M545 Low back pain, unspecified: Secondary | ICD-10-CM

## 2014-03-03 ENCOUNTER — Other Ambulatory Visit: Payer: Medicare Other

## 2014-03-08 ENCOUNTER — Other Ambulatory Visit: Payer: Medicare Other

## 2014-03-16 ENCOUNTER — Other Ambulatory Visit: Payer: Medicare Other

## 2014-03-22 ENCOUNTER — Other Ambulatory Visit: Payer: Medicare Other

## 2014-04-08 ENCOUNTER — Ambulatory Visit
Admission: RE | Admit: 2014-04-08 | Discharge: 2014-04-08 | Disposition: A | Discharge status: Home / Self Care | Payer: Medicare Other | Source: Ambulatory Visit | Attending: Orthopaedic Surgery | Admitting: Orthopaedic Surgery

## 2014-04-08 DIAGNOSIS — M545 Low back pain, unspecified: Secondary | ICD-10-CM

## 2014-05-11 ENCOUNTER — Other Ambulatory Visit: Payer: Self-pay | Admitting: Gastroenterology

## 2014-05-18 ENCOUNTER — Other Ambulatory Visit: Payer: Self-pay | Admitting: Gastroenterology

## 2014-08-27 ENCOUNTER — Other Ambulatory Visit: Payer: Self-pay | Admitting: *Deleted

## 2014-08-27 MED ORDER — VITAMIN D (ERGOCALCIFEROL) 1.25 MG (50000 UNIT) PO CAPS: 50000.0000 [IU] | ORAL_CAPSULE | ORAL | Status: DC

## 2014-08-27 NOTE — Telephone Encounter (Signed)
Patient last labs on 10-21-13. Was to return in 3 months for repeat labs. Please advise on refill

## 2014-08-31 ENCOUNTER — Telehealth: Payer: Self-pay | Admitting: Gastroenterology

## 2014-09-03 MED ORDER — PANTOPRAZOLE SODIUM 40 MG PO TBEC: DELAYED_RELEASE_TABLET | ORAL | Status: DC

## 2014-09-03 NOTE — Telephone Encounter (Signed)
rx sent as requested.

## 2014-10-10 ENCOUNTER — Other Ambulatory Visit: Payer: Self-pay

## 2014-10-10 DIAGNOSIS — Z1231 Encounter for screening mammogram for malignant neoplasm of breast: Secondary | ICD-10-CM

## 2014-10-16 ENCOUNTER — Telehealth: Payer: Self-pay | Admitting: Family Medicine

## 2014-10-16 NOTE — Telephone Encounter (Signed)
Appointment given for tomorrow with [facility] 

## 2014-10-17 ENCOUNTER — Other Ambulatory Visit: Payer: Self-pay | Admitting: Family Medicine

## 2014-10-17 ENCOUNTER — Ambulatory Visit (INDEPENDENT_AMBULATORY_CARE_PROVIDER_SITE_OTHER): Payer: Medicare Other

## 2014-10-17 ENCOUNTER — Encounter: Payer: Self-pay | Admitting: Family Medicine

## 2014-10-17 ENCOUNTER — Telehealth: Payer: Self-pay

## 2014-10-17 ENCOUNTER — Ambulatory Visit (INDEPENDENT_AMBULATORY_CARE_PROVIDER_SITE_OTHER): Payer: Medicare Other | Admitting: Family Medicine

## 2014-10-17 ENCOUNTER — Encounter (INDEPENDENT_AMBULATORY_CARE_PROVIDER_SITE_OTHER): Payer: Self-pay

## 2014-10-17 VITALS — BP 122/79 | HR 77 | Temp 97.1°F | Ht 62.0 in | Wt 205.4 lb

## 2014-10-17 DIAGNOSIS — J206 Acute bronchitis due to rhinovirus: Secondary | ICD-10-CM

## 2014-10-17 MED ORDER — BENZONATATE 100 MG PO CAPS: 100.0000 mg | ORAL_CAPSULE | Freq: Three times a day (TID) | ORAL | Status: DC | PRN

## 2014-10-17 MED ORDER — LEVALBUTEROL HCL 1.25 MG/0.5ML IN NEBU: 1.2500 mg | INHALATION_SOLUTION | Freq: Once | RESPIRATORY_TRACT | Status: DC

## 2014-10-17 MED ORDER — AMOXICILLIN 875 MG PO TABS: 875.0000 mg | ORAL_TABLET | Freq: Two times a day (BID) | ORAL | Status: DC

## 2014-10-17 MED ORDER — METHYLPREDNISOLONE ACETATE 80 MG/ML IJ SUSP
80.0000 mg | Freq: Once | INTRAMUSCULAR | Status: AC
  Administered 2014-10-17: 80 mg via INTRAMUSCULAR

## 2014-10-17 MED ORDER — METHYLPREDNISOLONE (PAK) 4 MG PO TABS: ORAL_TABLET | ORAL | Status: DC

## 2014-10-17 NOTE — Progress Notes (Signed)
   Subjective:    Patient ID: [name], female    DOB: Apr 09, 1961, 53 y.o.   MRN: [id number]  HPI Patient c/o cough, uri sx's, and shortness of breath.  She c/o back pain and DOE.  She has been coughing a lot and she has been having difficulty with having chest tightness.  She does not have any albuterol MDI.  Review of Systems  Constitutional: Negative for fever.  HENT: Negative for ear pain.   Eyes: Negative for discharge.  Respiratory: Negative for cough.   Cardiovascular: Negative for chest pain.  Gastrointestinal: Negative for abdominal distention.  Endocrine: Negative for polyuria.  Genitourinary: Negative for difficulty urinating.  Musculoskeletal: Negative for gait problem and neck pain.  Skin: Negative for color change and rash.  Neurological: Negative for speech difficulty and headaches.  Psychiatric/Behavioral: Negative for agitation.       Objective:    BP 122/79 mmHg  Pulse 77  Temp(Src) 97.1 F (36.2 C) (Oral)  Ht 5\' 2"  (1.575 m)  Wt 205 lb 6.4 oz (93.169 kg)  BMI 37.56 kg/m2 Physical Exam  Constitutional: She is oriented to person, place, and time. She appears well-developed and well-nourished.  HENT:  Head: Normocephalic and atraumatic.  Mouth/Throat: Oropharynx is clear and moist.  Eyes: Pupils are equal, round, and reactive to light.  Neck: Normal range of motion. Neck supple.  Cardiovascular: Normal rate and regular rhythm.   No murmur heard. Pulmonary/Chest: Effort normal and breath sounds normal.  Abdominal: Soft. Bowel sounds are normal. There is no tenderness.  Neurological: She is alert and oriented to person, place, and time.  Skin: Skin is warm and dry.  Psychiatric: She has a normal mood and affect.          Assessment & Plan:     ICD-9-CM ICD-10-CM   1. Acute bronchitis due to Rhinovirus 466.0 J20.6 methylPREDNISolone acetate (DEPO-MEDROL) injection 80 mg   079.3  levalbuterol (XOPENEX) nebulizer solution 1.25 mg     DG Chest  2 View     methylPREDNIsolone (MEDROL DOSPACK) 4 MG tablet     amoxicillin (AMOXIL) 875 MG tablet     benzonatate (TESSALON PERLES) 100 MG capsule     No Follow-up on file.  Lysbeth Penner FNP

## 2014-10-17 NOTE — Telephone Encounter (Signed)
Results of CXR left on mobile number as per DPR of  X-ray number given if any questions

## 2014-10-17 NOTE — Telephone Encounter (Signed)
-----   Message from Lysbeth Penner, FNP sent at 10/17/2014  3:53 PM EST ----- CXR interpretation by radiology suggests pneumonia so lets follow up if not better and repeat cxr in 2 weeks

## 2014-10-18 LAB — HGB A1C W/O EAG: Hgb A1c MFr Bld: 6.1 % — ABNORMAL HIGH (ref 4.8–5.6)

## 2014-10-18 LAB — LIPID PANEL
Chol/HDL Ratio: 4.5 ratio units — ABNORMAL HIGH (ref 0.0–4.4)
Cholesterol, Total: 201 mg/dL — ABNORMAL HIGH (ref 100–199)
HDL: 45 mg/dL (ref >39)
LDL Calculated: 123 mg/dL — ABNORMAL HIGH (ref 0–99)
Triglycerides: 166 mg/dL — ABNORMAL HIGH (ref 0–149)
VLDL Cholesterol Cal: 33 mg/dL (ref 5–40)

## 2014-10-18 LAB — HEPATIC FUNCTION PANEL
ALT: 23 IU/L (ref 0–32)
AST: 22 IU/L (ref 0–40)
Albumin: 4 g/dL (ref 3.5–5.5)
Alkaline Phosphatase: 96 IU/L (ref 39–117)
Bilirubin, Direct: 0.08 mg/dL (ref 0.00–0.40)
Total Bilirubin: 0.2 mg/dL (ref 0.0–1.2)
Total Protein: 6.8 g/dL (ref 6.0–8.5)

## 2014-10-18 LAB — BUN+CREAT
BUN/Creatinine Ratio: 21 (ref 9–23)
BUN: 20 mg/dL (ref 6–24)
Creatinine, Ser: 0.96 mg/dL (ref 0.57–1.00)
GFR calc Af Amer: 77 mL/min/{1.73_m2} (ref >59)
GFR calc non Af Amer: 67 mL/min/{1.73_m2} (ref >59)

## 2014-10-21 ENCOUNTER — Other Ambulatory Visit: Payer: Self-pay | Admitting: Family Medicine

## 2014-10-22 ENCOUNTER — Telehealth: Payer: Self-pay | Admitting: Family Medicine

## 2014-10-22 NOTE — Telephone Encounter (Signed)
Last ov 12/15 for sick visit. Last checkup 12/14.

## 2014-10-22 NOTE — Telephone Encounter (Signed)
Patient aware of lab results.  Please be sure results were sent to [facility] at [facility] in [place].

## 2014-10-22 NOTE — Telephone Encounter (Signed)
-----   Message from Lysbeth Penner, FNP sent at 10/19/2014  9:47 AM EST ----- Katherene Ponto are a lot better and would recommend taking fish oil otc.  Liver function test normal

## 2014-10-25 ENCOUNTER — Other Ambulatory Visit: Payer: Self-pay | Admitting: Family Medicine

## 2014-10-25 NOTE — Telephone Encounter (Signed)
Last seen and filled 10/17/14  [facility]

## 2014-10-29 ENCOUNTER — Telehealth: Payer: Self-pay | Admitting: Nurse Practitioner

## 2014-10-29 NOTE — Telephone Encounter (Signed)
Appointment for tomorrow with [facility].

## 2014-10-30 ENCOUNTER — Encounter: Payer: Self-pay | Admitting: Nurse Practitioner

## 2014-10-30 ENCOUNTER — Ambulatory Visit (INDEPENDENT_AMBULATORY_CARE_PROVIDER_SITE_OTHER): Payer: Medicare Other

## 2014-10-30 ENCOUNTER — Ambulatory Visit (INDEPENDENT_AMBULATORY_CARE_PROVIDER_SITE_OTHER): Payer: Medicare Other | Admitting: Nurse Practitioner

## 2014-10-30 VITALS — BP 141/86 | HR 75 | Temp 97.1°F | Ht 62.0 in | Wt 209.5 lb

## 2014-10-30 DIAGNOSIS — Z8701 Personal history of pneumonia (recurrent): Secondary | ICD-10-CM

## 2014-10-30 DIAGNOSIS — R05 Cough: Secondary | ICD-10-CM

## 2014-10-30 DIAGNOSIS — R059 Cough, unspecified: Secondary | ICD-10-CM

## 2014-10-30 MED ORDER — HYDROCODONE-HOMATROPINE 5-1.5 MG/5ML PO SYRP: 5.0000 mL | ORAL_SOLUTION | Freq: Three times a day (TID) | ORAL | Status: DC | PRN

## 2014-10-30 NOTE — Patient Instructions (Signed)

## 2014-10-30 NOTE — Progress Notes (Signed)
   Subjective:    Patient ID: [name], female    DOB: [birth year], 53 y.o.   MRN: [id number]  HPI Patient was in to see b[facility] on 10/17/14- dx with pneumonia- was given cough  Meds, amoxicillin and prednosone. Here today stating that she is SOB and chest pain. Describes chest pain as a knot feeling under right breast- hurts worse when she takes a deep breathe. Still coughing.    Review of Systems  Constitutional: Positive for fever (99 last night). Negative for chills and appetite change.  HENT: Positive for congestion.   Respiratory: Positive for cough.   Cardiovascular: Positive for chest pain.  Gastrointestinal: Negative.   Genitourinary: Negative.   Neurological: Negative.   Psychiatric/Behavioral: Negative.   All other systems reviewed and are negative.      Objective:   Physical Exam  Constitutional: She is oriented to person, place, and time. She appears well-developed and well-nourished. No distress.  HENT:  Right Ear: Hearing, tympanic membrane, external ear and ear canal normal.  Left Ear: Hearing, tympanic membrane, external ear and ear canal normal.  Nose: Mucosal edema and rhinorrhea present. Right sinus exhibits no maxillary sinus tenderness and no frontal sinus tenderness. Left sinus exhibits no maxillary sinus tenderness and no frontal sinus tenderness.  Mouth/Throat: Uvula is midline, oropharynx is clear and moist and mucous membranes are normal.  Eyes: Pupils are equal, round, and reactive to light.  Neck: Normal range of motion. Neck supple.  Cardiovascular: Normal rate, regular rhythm and normal heart sounds.   Pulmonary/Chest: Effort normal and breath sounds normal. She has no wheezes. She has no rales.  Wet cough  Abdominal: Soft.  Lymphadenopathy:    She has no cervical adenopathy.  Neurological: She is alert and oriented to person, place, and time.  Skin: Skin is warm and dry.  Psychiatric: She has a normal mood and affect. Her behavior is  normal. Judgment and thought content normal.    BP 141/86 mmHg  Pulse 75  Temp(Src) 97.1 F (36.2 C) (Oral)  Ht 5\' 2"  (1.575 m)  Wt 209 lb 8 oz (95.029 kg)  BMI 38.31 kg/m2  SpO2 99%  Chest x ray- clearing right basilar Herschel Senegal, FNP       Assessment & Plan:   1. Cough   2. H/O: pneumonia    1. Take meds as prescribed 2. Use a cool mist humidifier especially during the winter months and when heat has been humid. 3. Use saline nose sprays frequently 4. Saline irrigations of the nose can be very helpful if done frequently.  * 4X daily for 1 week*  * Use of a nettie pot can be helpful with this. Follow directions with this* 5. Drink plenty of fluids 6. Keep thermostat turn down low 7.For any cough or congestion  Use plain Mucinex- regular strength or max strength is fine   * Children- consult with Pharmacist for dosing 8. For fever or aces or pains- take tylenol or ibuprofen appropriate for age and weight.  * for fevers greater than 101 orally you may alternate ibuprofen and tylenol every  3 hours.   Mary-Margaret Hassell Done, FNP

## 2014-11-09 HISTORY — PX: LUMBAR DISC SURGERY: SHX700

## 2014-11-12 ENCOUNTER — Ambulatory Visit
Admission: RE | Admit: 2014-11-12 | Discharge: 2014-11-12 | Disposition: A | Discharge status: Home / Self Care | Payer: Medicare Other | Source: Ambulatory Visit

## 2014-11-12 DIAGNOSIS — Z1231 Encounter for screening mammogram for malignant neoplasm of breast: Secondary | ICD-10-CM

## 2014-12-14 ENCOUNTER — Other Ambulatory Visit: Payer: Self-pay | Admitting: Orthopaedic Surgery

## 2014-12-14 DIAGNOSIS — M545 Low back pain: Secondary | ICD-10-CM

## 2014-12-17 ENCOUNTER — Ambulatory Visit
Admission: RE | Admit: 2014-12-17 | Discharge: 2014-12-17 | Disposition: A | Discharge status: Home / Self Care | Payer: Medicare Other | Source: Ambulatory Visit | Attending: Orthopaedic Surgery | Admitting: Orthopaedic Surgery

## 2014-12-17 DIAGNOSIS — M545 Low back pain: Secondary | ICD-10-CM

## 2015-01-21 ENCOUNTER — Other Ambulatory Visit: Payer: Self-pay | Admitting: Family Medicine

## 2015-03-21 ENCOUNTER — Encounter: Payer: Self-pay | Admitting: Family

## 2015-03-21 ENCOUNTER — Ambulatory Visit (INDEPENDENT_AMBULATORY_CARE_PROVIDER_SITE_OTHER): Payer: Medicare Other | Admitting: Family

## 2015-03-21 VITALS — BP 120/81 | HR 88 | Temp 98.6°F | Ht 62.0 in | Wt 207.6 lb

## 2015-03-21 DIAGNOSIS — R300 Dysuria: Secondary | ICD-10-CM | POA: Diagnosis not present

## 2015-03-21 DIAGNOSIS — R319 Hematuria, unspecified: Secondary | ICD-10-CM

## 2015-03-21 DIAGNOSIS — N390 Urinary tract infection, site not specified: Secondary | ICD-10-CM

## 2015-03-21 LAB — POCT UA - MICROSCOPIC ONLY
CASTS, UR, LPF, POC: NEGATIVE
CRYSTALS, UR, HPF, POC: NEGATIVE
MUCUS UA: NEGATIVE
Yeast, UA: NEGATIVE

## 2015-03-21 LAB — POCT URINALYSIS DIPSTICK
Bilirubin, UA: NEGATIVE
Glucose, UA: NEGATIVE
Ketones, UA: NEGATIVE
NITRITE UA: POSITIVE
PH UA: 6
PROTEIN UA: NEGATIVE
Spec Grav, UA: 1.01
Urobilinogen, UA: NEGATIVE

## 2015-03-21 MED ORDER — SULFAMETHOXAZOLE-TRIMETHOPRIM 800-160 MG PO TABS: 1.0000 | ORAL_TABLET | Freq: Two times a day (BID) | ORAL | Status: DC

## 2015-03-21 MED ORDER — CEFTRIAXONE SODIUM 1 G IJ SOLR
1.0000 g | Freq: Once | INTRAMUSCULAR | Status: AC
  Administered 2015-03-21: 1 g via INTRAMUSCULAR

## 2015-03-21 NOTE — Patient Instructions (Signed)

## 2015-03-21 NOTE — Progress Notes (Signed)
   Subjective:    Patient ID: [name], female    DOB: Apr 07, 1961, 54 y.o.   MRN: [id number]  Dysuria  This is a recurrent problem. The current episode started yesterday. The problem occurs every urination. The problem has been waxing and waning. The quality of the pain is described as burning. The pain is at a severity of 10/10. The pain is moderate. The maximum temperature recorded prior to her arrival was 100 - 100.9 F. The fever has been present for less than 1 day. Associated symptoms include a discharge, frequency, hematuria, hesitancy and urgency. Pertinent negatives include no flank pain, nausea or vomiting. She has tried increased fluids and antibiotics for the symptoms. The treatment provided mild relief. Her past medical history is significant for recurrent UTIs.      Review of Systems  Constitutional: Negative.   HENT: Negative.   Eyes: Negative.   Respiratory: Negative.  Negative for shortness of breath.   Cardiovascular: Negative.  Negative for palpitations.  Gastrointestinal: Negative.  Negative for nausea and vomiting.  Endocrine: Negative.   Genitourinary: Positive for dysuria, hesitancy, urgency, frequency and hematuria. Negative for flank pain.  Musculoskeletal: Negative.   Neurological: Negative.  Negative for headaches.  Hematological: Negative.   Psychiatric/Behavioral: Negative.   All other systems reviewed and are negative.      Objective:   Physical Exam  Constitutional: She is oriented to person, place, and time. She appears well-developed and well-nourished. No distress.  HENT:  Head: Normocephalic and atraumatic.  Eyes: Pupils are equal, round, and reactive to light.  Neck: Normal range of motion. Neck supple. No thyromegaly present.  Cardiovascular: Normal rate, regular rhythm, normal heart sounds and intact distal pulses.   No murmur heard. Pulmonary/Chest: Effort normal and breath sounds normal. No respiratory distress. She has no wheezes.    Abdominal: Soft. Bowel sounds are normal. She exhibits no distension. There is tenderness (lower abd tenderness).  Musculoskeletal: Normal range of motion. She exhibits no edema or tenderness.  Negative CVA tenderness   Neurological: She is alert and oriented to person, place, and time. She has normal reflexes. No cranial nerve deficit.  Skin: Skin is warm and dry.  Psychiatric: She has a normal mood and affect. Her behavior is normal. Judgment and thought content normal.  Vitals reviewed.     BP 120/81 mmHg  Pulse 88  Temp(Src) 98.6 F (37 C) (Oral)  Ht 5\' 2"  (1.575 m)  Wt 207 lb 9.6 oz (94.167 kg)  BMI 37.96 kg/m2     Assessment & Plan:  1. Dysuria - POCT UA - Microscopic Only - Urine culture - POCT urinalysis dipstick  2. Urinary tract infection with hematuria, site unspecified -Force fluids AZO over the counter X2 days RTO prn Culture pending RTO in 2 weeks - cefTRIAXone (ROCEPHIN) injection 1 g; Inject 1 g into the muscle once. - sulfamethoxazole-trimethoprim (BACTRIM DS,SEPTRA DS) 800-160 MG per tablet; Take 1 tablet by mouth 2 (two) times daily.  Dispense: 10 tablet; Refill: 0  Evelina Dun, FNP

## 2015-03-22 ENCOUNTER — Other Ambulatory Visit: Payer: Self-pay | Admitting: Nurse Practitioner

## 2015-03-23 LAB — URINE CULTURE

## 2015-03-25 ENCOUNTER — Telehealth: Payer: Self-pay | Admitting: *Deleted

## 2015-03-25 ENCOUNTER — Other Ambulatory Visit: Payer: Self-pay | Admitting: Family

## 2015-03-25 MED ORDER — NITROFURANTOIN MONOHYD MACRO 100 MG PO CAPS: 100.0000 mg | ORAL_CAPSULE | Freq: Two times a day (BID) | ORAL | Status: DC

## 2015-03-25 NOTE — Telephone Encounter (Signed)
-----   Message from Sharion Balloon, [place] sent at 03/25/2015  8:27 AM EDT ----- Pt to stop current antibiotic and start new rx of nitrofurantoin.

## 2015-03-25 NOTE — Telephone Encounter (Signed)
Need to change medication for UTI.

## 2015-04-05 ENCOUNTER — Ambulatory Visit: Payer: Medicare Other | Admitting: Family

## 2015-04-18 ENCOUNTER — Other Ambulatory Visit: Payer: Self-pay | Admitting: Nurse Practitioner

## 2015-05-16 ENCOUNTER — Ambulatory Visit (INDEPENDENT_AMBULATORY_CARE_PROVIDER_SITE_OTHER): Payer: Medicare Other | Admitting: Physician Assistant

## 2015-05-16 ENCOUNTER — Ambulatory Visit (INDEPENDENT_AMBULATORY_CARE_PROVIDER_SITE_OTHER): Payer: Medicare Other

## 2015-05-16 ENCOUNTER — Encounter: Payer: Self-pay | Admitting: Physician Assistant

## 2015-05-16 VITALS — BP 115/68 | HR 84 | Temp 97.6°F | Ht 62.0 in | Wt 204.0 lb

## 2015-05-16 DIAGNOSIS — R059 Cough, unspecified: Secondary | ICD-10-CM

## 2015-05-16 DIAGNOSIS — R05 Cough: Secondary | ICD-10-CM

## 2015-05-16 DIAGNOSIS — J069 Acute upper respiratory infection, unspecified: Secondary | ICD-10-CM | POA: Diagnosis not present

## 2015-05-16 DIAGNOSIS — Z79899 Other long term (current) drug therapy: Secondary | ICD-10-CM

## 2015-05-16 MED ORDER — AZITHROMYCIN 250 MG PO TABS: ORAL_TABLET | ORAL | Status: DC

## 2015-05-16 MED ORDER — PREDNISONE 10 MG (21) PO TBPK: ORAL_TABLET | ORAL | Status: DC

## 2015-05-16 MED ORDER — ALBUTEROL SULFATE HFA 108 (90 BASE) MCG/ACT IN AERS: 2.0000 | INHALATION_SPRAY | Freq: Four times a day (QID) | RESPIRATORY_TRACT | Status: DC | PRN

## 2015-05-16 MED ORDER — BENZONATATE 200 MG PO CAPS: 200.0000 mg | ORAL_CAPSULE | Freq: Three times a day (TID) | ORAL | Status: DC | PRN

## 2015-05-16 NOTE — Progress Notes (Signed)
   Subjective:    Patient ID: [name], female    DOB: 1961-02-07, 54 y.o.   MRN: [id number]  HPI 54 y/o female presents with c/o cough and congestion x 2 weeks. Her husband has similar symptoms. She has tried Nyquil, humidifier with no relief. Smoker - currently smoking 1 pack per day    Review of Systems  Constitutional: Positive for fever (low grade ). Negative for chills and diaphoresis.  HENT: Positive for congestion (chest ) and postnasal drip. Negative for rhinorrhea, sneezing and sore throat.   Eyes: Negative.   Respiratory: Positive for cough (productive, worse at night ) and wheezing. Negative for shortness of breath.   Cardiovascular: Positive for chest pain (with coughing and breathing ).  Gastrointestinal: Negative.   All other systems reviewed and are negative.      Objective:   Physical Exam  Constitutional: She is oriented to person, place, and time. She appears well-developed and well-nourished.  Cardiovascular: Normal rate, regular rhythm and normal heart sounds.  Exam reveals no gallop and no friction rub.   No murmur heard. Pulmonary/Chest: Effort normal. No respiratory distress. She has wheezes (mild expiratory wheezes on LUL, appears to be chest tightness on inhalation ). She has no rales. She exhibits no tenderness.  Neurological: She is alert and oriented to person, place, and time.  Psychiatric: She has a normal mood and affect. Her behavior is normal. Judgment and thought content normal.  Nursing note and vitals reviewed.         Assessment & Plan:  1. Cough  - DG Chest 2 View; Future - benzonatate (TESSALON) 200 MG capsule; Take 1 capsule (200 mg total) by mouth 3 (three) times daily as needed for cough.  Dispense: 20 capsule; Refill: 0  2. Acute upper respiratory infection  - azithromycin (ZITHROMAX) 250 MG tablet; Take 2 tablets on day 1, 1 tablet on day 2-5  Dispense: 6 tablet; Refill: 0 - predniSONE (STERAPRED UNI-PAK 21 TAB) 10 MG (21)  TBPK tablet; 6 pills PO on day 1, 5 on day 2, 4 on day 3, 3 on day 4, 2 on day 5, 1 on day 6  Dispense: 21 tablet; Refill: 0 - albuterol (PROVENTIL HFA;VENTOLIN HFA) 108 (90 BASE) MCG/ACT inhaler; Inhale 2 puffs into the lungs every 6 (six) hours as needed for wheezing or shortness of breath.  Dispense: 1 Inhaler; Refill: 0  3. Long-term use of high-risk medication - Fax results to [phone] [facility] - Hepatic function panel - Hemoglobin A1c - Lipid panel   Continue all meds Labs pending Health Maintenance reviewed Diet and exercise encouraged RTO prn   Lafaye Mcelmurry A. Benjamin Stain PA-C

## 2015-05-17 LAB — HEMOGLOBIN A1C
Est. average glucose Bld gHb Est-mCnc: 131 mg/dL
Hgb A1c MFr Bld: 6.2 % — ABNORMAL HIGH (ref 4.8–5.6)

## 2015-05-17 LAB — HEPATIC FUNCTION PANEL
ALK PHOS: 94 IU/L (ref 39–117)
ALT: 20 IU/L (ref 0–32)
AST: 15 IU/L (ref 0–40)
Albumin: 3.8 g/dL (ref 3.5–5.5)
Bilirubin Total: <0.2 mg/dL (ref 0.0–1.2)
Bilirubin, Direct: 0.08 mg/dL (ref 0.00–0.40)
Total Protein: 6.5 g/dL (ref 6.0–8.5)

## 2015-05-17 LAB — LIPID PANEL
Chol/HDL Ratio: 6 ratio units — ABNORMAL HIGH (ref 0.0–4.4)
Cholesterol, Total: 193 mg/dL (ref 100–199)
HDL: 32 mg/dL — ABNORMAL LOW (ref >39)
LDL Calculated: 109 mg/dL — ABNORMAL HIGH (ref 0–99)
Triglycerides: 261 mg/dL — ABNORMAL HIGH (ref 0–149)
VLDL Cholesterol Cal: 52 mg/dL — ABNORMAL HIGH (ref 5–40)

## 2015-07-30 ENCOUNTER — Telehealth: Payer: Self-pay | Admitting: Physician Assistant

## 2015-08-27 ENCOUNTER — Other Ambulatory Visit: Payer: Self-pay | Admitting: Gastroenterology

## 2015-09-02 ENCOUNTER — Other Ambulatory Visit: Payer: Self-pay | Admitting: Gastroenterology

## 2015-09-03 ENCOUNTER — Other Ambulatory Visit: Payer: Self-pay | Admitting: Gastroenterology

## 2015-11-01 ENCOUNTER — Ambulatory Visit: Payer: Medicare Other | Admitting: Family Medicine

## 2015-11-01 ENCOUNTER — Ambulatory Visit (INDEPENDENT_AMBULATORY_CARE_PROVIDER_SITE_OTHER): Payer: Medicare Other | Admitting: Family Medicine

## 2015-11-01 ENCOUNTER — Encounter: Payer: Self-pay | Admitting: Family Medicine

## 2015-11-01 VITALS — BP 109/75 | HR 77 | Temp 97.9°F | Ht 62.0 in | Wt 196.2 lb

## 2015-11-01 DIAGNOSIS — J189 Pneumonia, unspecified organism: Secondary | ICD-10-CM | POA: Diagnosis not present

## 2015-11-01 MED ORDER — GUAIFENESIN 100 MG/5ML PO SOLN: 5.0000 mL | ORAL | Status: DC | PRN

## 2015-11-01 MED ORDER — AMOXICILLIN-POT CLAVULANATE 875-125 MG PO TABS: 1.0000 | ORAL_TABLET | Freq: Two times a day (BID) | ORAL | Status: DC

## 2015-11-01 NOTE — Progress Notes (Signed)
   HPI  Patient presents today today to discuss her illness.  Patient explains that she's been sick for 2 weeks, she's been coughing, had shortness of breath, and wheezing that is most prominent at night.  She also states that over the last 3 days she's developed bilateral lower thoracic chest pain with movement or cough.  She denies fever, chills, sweats, oral intolerance. She also has malaise. No ear pain, or sinus pain  PMH: Smoking status noted ROS: Per HPI  Objective: BP 109/75 mmHg  Pulse 77  Temp(Src) 97.9 F (36.6 C) (Oral)  Ht 5\' 2"  (1.575 m)  Wt 196 lb 3.2 oz (88.996 kg)  BMI 35.88 kg/m2 Gen: NAD, alert, cooperative with exam HEENT: NCAT, TMs normal bilaterally, oropharynx clear CV: RRR, good S1/S2, no murmur Resp: CTABL, no wheezes, non-labored Ext: No edema, warm Neuro: Alert and oriented, No gross deficits  Assessment and plan:  # Community acquired pneumonia Clinical diagnosis given persistence of cough and severity of illness. She's also a smoker, consider underlying COPD Supportive care, rest, fluids Return to clinic if worsening or does not improve as expected     Meds ordered this encounter  Medications  . amoxicillin-clavulanate (AUGMENTIN) 875-125 MG tablet    Sig: Take 1 tablet by mouth 2 (two) times daily.    Dispense:  20 tablet    Refill:  0  . guaiFENesin (ROBITUSSIN) 100 MG/5ML SOLN    Sig: Take 5 mLs (100 mg total) by mouth every 4 (four) hours as needed for cough or to loosen phlegm.    Dispense:  1200 mL    Refill:  0    Laroy Apple, MD [facility] 11/01/2015, 1:20 PM

## 2015-11-01 NOTE — Patient Instructions (Signed)
Great to meet you!  Come back if you do not get worse or do not get better as expected.   Eat yogurt daily while on this medication.   Community-Acquired Pneumonia, Adult Pneumonia is an infection of the lungs. There are different types of pneumonia. One type can develop while a person is in a hospital. A different type, called community-acquired pneumonia, develops in people who are not, or have not recently been, in the hospital or other health care facility.  CAUSES Pneumonia may be caused by bacteria, viruses, or funguses. Community-acquired pneumonia is often caused by Streptococcus pneumonia bacteria. These bacteria are often passed from one person to another by breathing in droplets from the cough or sneeze of an infected person. RISK FACTORS The condition is more likely to develop in:  People who havechronic diseases, such as chronic obstructive pulmonary disease (COPD), asthma, congestive heart failure, cystic fibrosis, diabetes, or kidney disease.  People who haveearly-stage or late-stage HIV.  People who havesickle cell disease.  People who havehad their spleen removed (splenectomy).  People who havepoor Human resources officer.  People who havemedical conditions that increase the risk of breathing in (aspirating) secretions their own mouth and nose.   People who havea weakened immune system (immunocompromised).  People who smoke.  People whotravel to areas where pneumonia-causing germs commonly exist.  People whoare around animal habitats or animals that have pneumonia-causing germs, including birds, bats, rabbits, cats, and farm animals. SYMPTOMS Symptoms of this condition include:  Adry cough.  A wet (productive) cough.  Fever.  Sweating.  Chest pain, especially when breathing deeply or coughing.  Rapid breathing or difficulty breathing.  Shortness of breath.  Shaking chills.  Fatigue.  Muscle aches. DIAGNOSIS Your health care provider will take  a medical history and perform a physical exam. You may also have other tests, including:  Imaging studies of your chest, including X-rays.  Tests to check your blood oxygen level and other blood gases.  Other tests on blood, mucus (sputum), fluid around your lungs (pleural fluid), and urine. If your pneumonia is severe, other tests may be done to identify the specific cause of your illness. TREATMENT The type of treatment that you receive depends on many factors, such as the cause of your pneumonia, the medicines you take, and other medical conditions that you have. For most adults, treatment and recovery from pneumonia may occur at home. In some cases, treatment must happen in a hospital. Treatment may include:  Antibiotic medicines, if the pneumonia was caused by bacteria.  Antiviral medicines, if the pneumonia was caused by a virus.  Medicines that are given by mouth or through an IV tube.  Oxygen.  Respiratory therapy. Although rare, treating severe pneumonia may include:  Mechanical ventilation. This is done if you are not breathing well on your own and you cannot maintain a safe blood oxygen level.  Thoracentesis. This procedureremoves fluid around one lung or both lungs to help you breathe better. HOME CARE INSTRUCTIONS  Take over-the-counter and prescription medicines only as told by your health care provider.  Only takecough medicine if you are losing sleep. Understand that cough medicine can prevent your body's natural ability to remove mucus from your lungs.  If you were prescribed an antibiotic medicine, take it as told by your health care provider. Do not stop taking the antibiotic even if you start to feel better.  Sleep in a semi-upright position at night. Try sleeping in a reclining chair, or place a few pillows under your  head.  Do not use tobacco products, including cigarettes, chewing tobacco, and e-cigarettes. If you need help quitting, ask your health care  provider.  Drink enough water to keep your urine clear or pale yellow. This will help to thin out mucus secretions in your lungs. PREVENTION There are ways that you can decrease your risk of developing community-acquired pneumonia. Consider getting a pneumococcal vaccine if:  You are older than 56 years of age.  You are older than 56 years of age and are undergoing cancer treatment, have chronic lung disease, or have other medical conditions that affect your immune system. Ask your health care provider if this applies to you. There are different types and schedules of pneumococcal vaccines. Ask your health care provider which vaccination option is best for you. You may also prevent community-acquired pneumonia if you take these actions:  Get an influenza vaccine every year. Ask your health care provider which type of influenza vaccine is best for you.  Go to the dentist on a regular basis.  Wash your hands often. Use hand sanitizer if soap and water are not available. SEEK MEDICAL CARE IF:  You have a fever.  You are losing sleep because you cannot control your cough with cough medicine. SEEK IMMEDIATE MEDICAL CARE IF:  You have worsening shortness of breath.  You have increased chest pain.  Your sickness becomes worse, especially if you are an older adult or have a weakened immune system.  You cough up blood.   This information is not intended to replace advice given to you by your health care provider. Make sure you discuss any questions you have with your health care provider.   Document Released: 10/26/2005 Document Revised: 07/17/2015 Document Reviewed: 02/20/2015 Elsevier Interactive Patient Education [employer].

## 2015-11-06 ENCOUNTER — Ambulatory Visit (INDEPENDENT_AMBULATORY_CARE_PROVIDER_SITE_OTHER): Payer: Medicare Other | Admitting: Gastroenterology

## 2015-11-06 ENCOUNTER — Encounter: Payer: Self-pay | Admitting: Gastroenterology

## 2015-11-06 VITALS — BP 120/90 | HR 64 | Ht 62.0 in | Wt 195.8 lb

## 2015-11-06 DIAGNOSIS — K219 Gastro-esophageal reflux disease without esophagitis: Secondary | ICD-10-CM

## 2015-11-06 DIAGNOSIS — R109 Unspecified abdominal pain: Secondary | ICD-10-CM

## 2015-11-06 MED ORDER — METOCLOPRAMIDE HCL 5 MG PO TABS
5.0000 mg | ORAL_TABLET | Freq: Every day | ORAL | Status: DC
Start: 2015-11-06 — End: 2015-11-19

## 2015-11-06 MED ORDER — PANTOPRAZOLE SODIUM 40 MG PO TBEC: 40.0000 mg | DELAYED_RELEASE_TABLET | Freq: Two times a day (BID) | ORAL | Status: DC

## 2015-11-06 NOTE — Patient Instructions (Addendum)
Trial of reglan 61m pill at bedtime every night.  Call in 5-6 weeks to report on your response to nausea, vomiting in AM. You will be set up for a CT scan of abdomen and pelvis with IV and oral contrast for right sided abdominal pains.   You have been scheduled for a CT [facility] the [facility] at [facility]([id number]this is in the same building as [facility].   You are scheduled on 11/15/15 at 230 pm. You should arrive 15 minutes prior to your appointment time for registration. Please follow the written instructions below on the day of your exam:  WARNING: IF YOU ARE ALLERGIC TO IODINE/X-RAY DYE, PLEASE NOTIFY RADIOLOGY IMMEDIATELY AT [phone] YOU WILL BE GIVEN A 13 HOUR PREMEDICATION PREP.  1) Do not eat or drink anything after 1030 am (4 hours prior to your test) 2) You have been given 2 bottles of oral contrast to drink. The solution may taste better if refrigerated, but do NOT add ice or any other liquid to this solution. Shake well before drinking.    Drink 1 bottle of contrast @ 1230 pm (2 hours prior to your exam)  Drink 1 bottle of contrast @ 130 pm (1 hour prior to your exam)  You may take any medications as prescribed with a small amount of water except for the following: Metformin, Glucophage, Glucovance, Avandamet, Riomet, Fortamet, Actoplus Met, Janumet, Glumetza or Metaglip. The above medications must be held the day of the exam AND 48 hours after the exam.  The purpose of you drinking the oral contrast is to aid in the visualization of your intestinal tract. The contrast solution may cause some diarrhea. Before your exam is started, you will be given a small amount of fluid to drink. Depending on your individual set of symptoms, you may also receive an intravenous injection of x-ray contrast/dye. [facility], depending on the type of exam you are having performed.  This test typically takes  30-45 minutes to complete.  If you have any questions regarding your exam or if you need to reschedule, you may call the CT department at [phone] the hours of 8:00 am and 5:00 pm, Monday-Friday.  ________________________________________________________________________  Refills on protonix given today.

## 2015-11-06 NOTE — Progress Notes (Signed)
Review of pertinent gastrointestinal problems:  1. Routine risk for colon cancer. Colonoscopy March 2013 found mild diverticulosis only. Recommended 10 year recall colonoscopy  2. intermittent abdominal pains, felt possibly due to 4-5 cm cystic lesion in liver. She underwent wedge resection of liver 03/2012 and these were found to be hamartomas. Her gallbladder was also removed at the same time. Pains improved after the surgery. 3. EGD for mild dysphasia, dyspepsia March 2013 showed mild gastritis that was H. pylori negative. 4. EGD 11/2012 for weight loss, post prandial discomfort showed mild gastritis, biopsies showed no H. Pylori. 10/2012 CT scan Of abdomen and pelvis with IV and oral contrast last month was normal CBC, complete metabolic profile last month were normal. GES afterwards showed slow gastric emptying (felt possibly due to narcotic pain meds). Was advised to cut down narcotics.   HPI: This is a     Pleasant 54 year old woman who is here with her sister today. I last saw her a year or 2 ago.  Chief complaint is  Nausea vomiting, esophageal like spasm, GERD  Takes 30mg  hydrocodone daily.  3 of the 10mg  pills.   This is lower than her previous dosing but obviously still quite a lot  Having vomiting in AM.   This occurs every morning shortly after she awakens. After she vomits she feels better. She does not vomit any blood  Having pains in right sided abd pains for about a year.  + low grade fevers or chills. The pain is sharp.    The pain is constant. It does not seem to limit her from doing anything. She tells me this pain is reminiscent of the pain she was having her right side when she was diagnosed with a large liver hamartomas 3 or 4 years ago.  her weight is up over 30 pounds since her last visit here in GI in about 2 and half years. Same scale.  She feels a twisting sensation, cramping in esophagus region.   she needs refills for her proton pump inhibitor   Past  Medical History  Diagnosis Date  . Liver lesion   . Hypertension   . Breast changes, fibrocystic   . Idiopathic thrombocytopenia ([place])     as child  . Arthritis   . Clotting disorder ([place])      itp , none since 1976, no current hematologist  . GERD (gastroesophageal reflux disease)   . Hyperlipidemia   . Osteoporosis   . Nasal congestion   . Trouble swallowing   . Leg swelling     both ankles  . Abdominal pain   . Rectal bleeding   . Blood in stool   . Unintentional weight loss   . Constipation   . Nausea & vomiting   . Generalized headaches   . Weakness     weakness varies  . Bruises easily   . Anxiety   . Depression   . DDD (degenerative disc disease), cervical     with lumbar issues  . Seizures ([place]) 1998    stress induced, one time, none since, no seizure meds  . Neuromuscular disorder ([place])     back  injury  . Neuromuscular disorder ([place])     3 neck surgeries,neck fusion.pin,plates,screws  . Panic attacks     pt also  has germaphobia  . Hemorrhoid   . Spleen enlarged 1976, none since    r/t idiopathic thrombocytopenia  . Sleep apnea     STOPBANG=4    Past Surgical  History  Procedure Laterality Date  . Cervical spine surgery  2005, 2007, 2010    x3, fusion done,plates and screws present  . Cesarean section  1988, 1994  . Abdominal hysterectomy  1997  . Colonscopy and esophagogastrodudononescopy  02-04-12  . Cholecystectomy  03/14/2012    Procedure: LAPAROSCOPIC CHOLECYSTECTOMY;  Surgeon: Stark Klein, MD;  Location: [facility];  Service: General;  Laterality: N/A;  . Lumbar disc surgery  11/2014    Current Outpatient Prescriptions  Medication Sig Dispense Refill  . ALPRAZolam (XANAX) 0.5 MG tablet Take 0.5 mg by mouth 4 (four) times daily.     Marland Kitchen amoxicillin-clavulanate (AUGMENTIN) 875-125 MG tablet Take 1 tablet by mouth 2 (two) times daily. 20 tablet 0  . atenolol (TENORMIN) 50 MG tablet TAKE ONE TABLET BY MOUTH ONCE DAILY WITH BREAKFAST 90 tablet 3  .  citalopram (CELEXA) 40 MG tablet Take 40 mg by mouth daily with breakfast.     . gabapentin (NEURONTIN) 300 MG capsule Take 600 mg by mouth 3 (three) times daily.     Marland Kitchen HYDROcodone-acetaminophen (NORCO) 10-325 MG per tablet Take 1-2 tablets by mouth every 6 (six) hours as needed for pain. For pain 60 tablet 0  . lisinopril-hydrochlorothiazide (PRINZIDE,ZESTORETIC) 20-25 MG per tablet TAKE ONE TABLET BY MOUTH ONCE DAILY 90 tablet 3  . pantoprazole (PROTONIX) 40 MG tablet TAKE ONE TABLET BY MOUTH TWICE DAILY 180 tablet 0  . risperiDONE (RISPERDAL) 0.5 MG tablet Take 0.5 mg by mouth 2 (two) times daily.     . traZODone (DESYREL) 100 MG tablet Take 100 mg by mouth at bedtime.     . VOLTAREN 1 % GEL Apply 2 g topically 4 (four) times daily.     . [DISCONTINUED] hydrochlorothiazide (HYDRODIURIL) 25 MG tablet Take 25 mg by mouth daily.     No current facility-administered medications for this visit.    Allergies as of 11/06/2015 - Review Complete 11/06/2015  Allergen Reaction Noted  . Nsaids Nausea Only 01/06/2012    Family History  Problem Relation Age of Onset  . Heart disease Maternal Grandmother   . Hypertension Mother   . Heart disease Mother   . Diabetes Sister   . Lung cancer Father   . Cancer Father     lung  . Colon cancer Paternal Grandmother   . Cancer Paternal Grandmother     colon    Social History   Social History  . Marital Status: Married    Spouse Name: N/A  . Number of Children: 3  . Years of Education: N/A   Occupational History  . Diabled    Social History Main Topics  . Smoking status: Current Every Day Smoker -- 1.00 packs/day for 5 years    Types: Cigarettes    Start date: 12/20/2009  . Smokeless tobacco: Never Used  . Alcohol Use: No  . Drug Use: No  . Sexual Activity: Not on file   Other Topics Concern  . Not on file   Social History Narrative   She lives with her boyfriend.  She used to smoke, quit May 2013, she is not drinking. She has 3  children     Physical Exam: BP 120/90 mmHg  Pulse 64  Ht 5\' 2"  (1.575 m)  Wt 195 lb 12.8 oz (88.814 kg)  BMI 35.80 kg/m2 Constitutional: generally well-appearing Psychiatric: alert and oriented x3 Abdomen: soft, nontender, nondistended, no obvious ascites, no peritoneal signs, normal bowel sounds   Assessment and plan: 54 y.o. female  with  Right-sided abdominal pains, morning nausea vomiting , GERD   she tells me her right sided pains are similar to the pain she had she was diagnosed with large liver hamartomas. She has not had repeat imaging of her liver in 3 or 4 years and I'm going to order CAT scan to be done now. She has morning nausea and vomiting. I suspect this is at least in part to her chronic narcotic use and documented resultant gastroparesis. I recommended she continue to try to wean back on her narcotic pain medicines. She has never tried Reglan and we discussed this. She understands there is potential nonreversible side effect of lip smacking but it the doses I would prescribed, 5 or 10 mg I think it is very unlikely to happen. She will start 5 mg pill at bedtime every night. I'm giving her refills of her proton pump inhibitor twice daily.   Owens Loffler, MD [facility] 11/06/2015, 9:59 AM

## 2015-11-15 ENCOUNTER — Ambulatory Visit (INDEPENDENT_AMBULATORY_CARE_PROVIDER_SITE_OTHER)
Admission: RE | Admit: 2015-11-15 | Discharge: 2015-11-15 | Disposition: A | Discharge status: Home / Self Care | Payer: Medicare Other | Source: Ambulatory Visit | Attending: Gastroenterology | Admitting: Gastroenterology

## 2015-11-15 DIAGNOSIS — R109 Unspecified abdominal pain: Secondary | ICD-10-CM

## 2015-11-15 MED ORDER — IOHEXOL 300 MG/ML  SOLN
100.0000 mL | Freq: Once | INTRAMUSCULAR | Status: AC | PRN
  Administered 2015-11-15: 100 mL via INTRAVENOUS

## 2015-11-19 ENCOUNTER — Telehealth: Payer: Self-pay | Admitting: Gastroenterology

## 2015-11-19 MED ORDER — METOCLOPRAMIDE HCL 10 MG PO TABS: 10.0000 mg | ORAL_TABLET | Freq: Every day | ORAL | Status: DC

## 2015-11-19 NOTE — Telephone Encounter (Signed)
Dr Ardis Hughs the pt would like to have the 10 mg Reglan sent to the pharmacy, is this ok?  Per your last office note you stated 5 or 10 mg is ok.

## 2015-11-19 NOTE — Telephone Encounter (Signed)
Ok, reglan 10mg  PO, take one pill at bedtime nightly, disp 30, 11 refills.  Thanks

## 2015-11-19 NOTE — Telephone Encounter (Signed)
Prescription has been sent.

## 2015-12-23 ENCOUNTER — Ambulatory Visit (AMBULATORY_SURGERY_CENTER): Payer: Self-pay

## 2015-12-23 VITALS — Ht 64.0 in | Wt 199.2 lb

## 2015-12-23 DIAGNOSIS — R109 Unspecified abdominal pain: Secondary | ICD-10-CM

## 2015-12-23 NOTE — Progress Notes (Signed)
No allergies to eggs or soy No diet/weight loss meds No home oxygen No past problems with anesthesia  Has email and internet; refused emmi 

## 2015-12-30 ENCOUNTER — Ambulatory Visit (AMBULATORY_SURGERY_CENTER): Payer: Medicare HMO | Admitting: Gastroenterology

## 2015-12-30 ENCOUNTER — Encounter: Payer: Self-pay | Admitting: Gastroenterology

## 2015-12-30 VITALS — BP 104/71 | HR 68 | Temp 97.2°F | Resp 14 | Ht 64.0 in | Wt 199.0 lb

## 2015-12-30 DIAGNOSIS — R109 Unspecified abdominal pain: Secondary | ICD-10-CM | POA: Diagnosis present

## 2015-12-30 DIAGNOSIS — R112 Nausea with vomiting, unspecified: Secondary | ICD-10-CM

## 2015-12-30 MED ORDER — SODIUM CHLORIDE 0.9 % IV SOLN: 500.0000 mL | INTRAVENOUS | Status: DC

## 2015-12-30 NOTE — Patient Instructions (Signed)
YOU HAD AN ENDOSCOPIC PROCEDURE TODAY AT [facility]:   Refer to the procedure report that was given to you for any specific questions about what was found during the examination.  If the procedure report does not answer your questions, please call your gastroenterologist to clarify.  If you requested that your care partner not be given the details of your procedure findings, then the procedure report has been included in a sealed envelope for you to review at your convenience later.  YOU SHOULD EXPECT: Some feelings of bloating in the abdomen. Passage of more gas than usual.  Walking can help get rid of the air that was put into your GI tract during the procedure and reduce the bloating. If you had a lower endoscopy (such as a colonoscopy or flexible sigmoidoscopy) you may notice spotting of blood in your stool or on the toilet paper. If you underwent a bowel prep for your procedure, you may not have a normal bowel movement for a few days.  Please Note:  You might notice some irritation and congestion in your nose or some drainage.  This is from the oxygen used during your procedure.  There is no need for concern and it should clear up in a day or so.  SYMPTOMS TO REPORT IMMEDIATELY:     Following upper endoscopy (EGD)  Vomiting of blood or coffee ground material  New chest pain or pain under the shoulder blades  Painful or persistently difficult swallowing  New shortness of breath  Fever of 100F or higher  Black, tarry-looking stools  For urgent or emergent issues, a gastroenterologist can be reached at any hour by calling [phone].   DIET: Your first meal following the procedure should be a small meal and then it is ok to progress to your normal diet. Heavy or fried foods are harder to digest and may make you feel nauseous or bloated.  Likewise, meals heavy in dairy and vegetables can increase bloating.  Drink plenty of fluids but you should avoid alcoholic beverages  for 24 hours.  ACTIVITY:  You should plan to take it easy for the rest of today and you should NOT DRIVE or use heavy machinery until tomorrow (because of the sedation medicines used during the test).    FOLLOW UP: Our staff will call the number listed on your records the next business day following your procedure to check on you and address any questions or concerns that you may have regarding the information given to you following your procedure. If we do not reach you, we will leave a message.  However, if you are feeling well and you are not experiencing any problems, there is no need to return our call.  We will assume that you have returned to your regular daily activities without incident.  If any biopsies were taken you will be contacted by phone or by letter within the next 1-3 weeks.  Please call us at [phone] if you have not heard about the biopsies in 3 weeks.    SIGNATURES/CONFIDENTIALITY: You and/or your care partner signed paperwork which will be entered into your electronic medical record.  These signatures attest to the fact that that the information above on your After Visit Summary has been reviewed and is understood.  Full responsibility of the confidentiality of this discharge information lies with you and/or your care-partner.  Please read all handouts given to you by your recovery RN. Thank you for letting us take care of  your healthcare needs.

## 2015-12-30 NOTE — Progress Notes (Signed)
Report to PACU, RN, vss, BBS= Clear.  

## 2015-12-30 NOTE — Progress Notes (Addendum)
Pt. Stated that she had a seizure 2 weeks ago,it's like a stare that she has been to neurology and they can not find a cause for them and that the seizure is not a big thing in her life. Will inform CRNA.Dr. Ardis Hughs aware and they will do procedure.

## 2015-12-30 NOTE — Op Note (Signed)
[address] [place], [address]   ENDOSCOPY PROCEDURE REPORT  PATIENT: Betty, Garza  MR#: [id number] BIRTHDATE: 08/12/1961 , 36  yrs. old GENDER: female ENDOSCOPIST: Milus Banister, MD PROCEDURE DATE:  12/30/2015 PROCEDURE:  EGD, diagnostic ASA CLASS:     Class II INDICATIONS:  dypepsia, morning nausea/vomiting, right sided abdominal pain; abnormal (thickened) distal esophagus on CT. MEDICATIONS: Monitored anesthesia care and Propofol 150 mg IV TOPICAL ANESTHETIC: none  DESCRIPTION OF PROCEDURE: After the risks benefits and alternatives of the procedure were thoroughly explained, informed consent was obtained.  The LB [id number] endoscope was introduced through the mouth and advanced to the second portion of the duodenum , Without limitations.  The instrument was slowly withdrawn as the mucosa was fully examined.   EXAM: The esophagus and gastroesophageal junction were completely normal in appearance.  The stomach was entered and closely examined.The antrum, angularis, and lesser curvature were well visualized, including a retroflexed view of the cardia and fundus. The stomach wall was normally distensable.  The scope passed easily through the pylorus into the duodenum.  Retroflexed views revealed no abnormalities.     The scope was then withdrawn from the patient and the procedure completed. COMPLICATIONS: There were no immediate complications.  ENDOSCOPIC IMPRESSION: Normal appearing esophagus and GE junction, the stomach was well visualized and normal in appearance, normal appearing duodenum  RECOMMENDATIONS: Continue reglan 10mg  pill, one pill at bedtime every night as this seems to be helping quite a lot.  eSigned:  Milus Banister, MD 12/30/2015 9:33 AM    CC: Redge Gainer, MD

## 2015-12-31 ENCOUNTER — Telehealth: Payer: Self-pay | Admitting: Emergency Medicine

## 2015-12-31 NOTE — Telephone Encounter (Signed)
  Follow up Call-  Call back number 12/30/2015  Post procedure Call Back phone  # [phone]  Permission to leave phone message Yes     Patient questions:  Do you have a fever, pain , or abdominal swelling? No. Pain Score  0 *  Have you tolerated food without any problems? Yes.    Have you been able to return to your normal activities? Yes.    Do you have any questions about your discharge instructions: Diet   No. Medications  No. Follow up visit  No.  Do you have questions or concerns about your Care? No.  Actions: * If pain score is 4 or above: No action needed, pain <4.

## 2016-01-17 ENCOUNTER — Encounter: Payer: Medicare Other | Admitting: Family Medicine

## 2016-04-15 ENCOUNTER — Other Ambulatory Visit: Payer: Self-pay | Admitting: Family

## 2016-04-16 NOTE — Telephone Encounter (Signed)
Last seen 11/01/15  Dr Wendi Snipes  Requesting 90 day supply

## 2016-07-02 ENCOUNTER — Ambulatory Visit (INDEPENDENT_AMBULATORY_CARE_PROVIDER_SITE_OTHER): Payer: Medicare HMO

## 2016-07-02 ENCOUNTER — Ambulatory Visit (INDEPENDENT_AMBULATORY_CARE_PROVIDER_SITE_OTHER): Payer: Medicare HMO | Admitting: Family

## 2016-07-02 ENCOUNTER — Encounter (INDEPENDENT_AMBULATORY_CARE_PROVIDER_SITE_OTHER): Payer: Self-pay

## 2016-07-02 ENCOUNTER — Encounter: Payer: Self-pay | Admitting: Family

## 2016-07-02 VITALS — BP 112/75 | HR 64 | Temp 97.3°F | Ht 64.0 in | Wt 186.6 lb

## 2016-07-02 DIAGNOSIS — M545 Low back pain: Secondary | ICD-10-CM | POA: Diagnosis not present

## 2016-07-02 DIAGNOSIS — R5383 Other fatigue: Secondary | ICD-10-CM | POA: Diagnosis not present

## 2016-07-02 DIAGNOSIS — F17200 Nicotine dependence, unspecified, uncomplicated: Secondary | ICD-10-CM

## 2016-07-02 DIAGNOSIS — R9431 Abnormal electrocardiogram [ECG] [EKG]: Secondary | ICD-10-CM | POA: Diagnosis not present

## 2016-07-02 DIAGNOSIS — M5030 Other cervical disc degeneration, unspecified cervical region: Secondary | ICD-10-CM

## 2016-07-02 DIAGNOSIS — E669 Obesity, unspecified: Secondary | ICD-10-CM | POA: Diagnosis not present

## 2016-07-02 DIAGNOSIS — E785 Hyperlipidemia, unspecified: Secondary | ICD-10-CM

## 2016-07-02 DIAGNOSIS — G4700 Insomnia, unspecified: Secondary | ICD-10-CM

## 2016-07-02 DIAGNOSIS — F329 Major depressive disorder, single episode, unspecified: Secondary | ICD-10-CM

## 2016-07-02 DIAGNOSIS — Z0000 Encounter for general adult medical examination without abnormal findings: Secondary | ICD-10-CM

## 2016-07-02 DIAGNOSIS — K219 Gastro-esophageal reflux disease without esophagitis: Secondary | ICD-10-CM

## 2016-07-02 DIAGNOSIS — I10 Essential (primary) hypertension: Secondary | ICD-10-CM | POA: Diagnosis not present

## 2016-07-02 DIAGNOSIS — Z01419 Encounter for gynecological examination (general) (routine) without abnormal findings: Secondary | ICD-10-CM | POA: Diagnosis not present

## 2016-07-02 DIAGNOSIS — M8580 Other specified disorders of bone density and structure, unspecified site: Secondary | ICD-10-CM | POA: Diagnosis not present

## 2016-07-02 DIAGNOSIS — F411 Generalized anxiety disorder: Secondary | ICD-10-CM

## 2016-07-02 DIAGNOSIS — Z720 Tobacco use: Secondary | ICD-10-CM

## 2016-07-02 DIAGNOSIS — G8929 Other chronic pain: Secondary | ICD-10-CM

## 2016-07-02 DIAGNOSIS — F32A Depression, unspecified: Secondary | ICD-10-CM

## 2016-07-02 DIAGNOSIS — Z780 Asymptomatic menopausal state: Secondary | ICD-10-CM | POA: Diagnosis not present

## 2016-07-02 LAB — URINALYSIS, COMPLETE
BILIRUBIN UA: NEGATIVE
Glucose, UA: NEGATIVE
KETONES UA: NEGATIVE
NITRITE UA: NEGATIVE
PROTEIN UA: NEGATIVE
SPEC GRAV UA: 1.02 (ref 1.005–1.030)
Urobilinogen, Ur: 0.2 mg/dL (ref 0.2–1.0)
pH, UA: 6.5 (ref 5.0–7.5)

## 2016-07-02 LAB — MICROSCOPIC EXAMINATION

## 2016-07-02 MED ORDER — CITALOPRAM HYDROBROMIDE 40 MG PO TABS: 40.0000 mg | ORAL_TABLET | Freq: Every day | ORAL | 1 refills | Status: DC

## 2016-07-02 MED ORDER — ALPRAZOLAM 0.5 MG PO TABS: 0.5000 mg | ORAL_TABLET | Freq: Three times a day (TID) | ORAL | 1 refills | Status: DC | PRN

## 2016-07-02 MED ORDER — LISINOPRIL-HYDROCHLOROTHIAZIDE 20-25 MG PO TABS: 1.0000 | ORAL_TABLET | Freq: Every day | ORAL | 1 refills | Status: DC

## 2016-07-02 MED ORDER — ATENOLOL 50 MG PO TABS: ORAL_TABLET | ORAL | 1 refills | Status: DC

## 2016-07-02 MED ORDER — TRAZODONE HCL 100 MG PO TABS: 100.0000 mg | ORAL_TABLET | Freq: Every day | ORAL | 1 refills | Status: DC

## 2016-07-02 NOTE — Progress Notes (Addendum)
Subjective:    Patient ID: [name], female    DOB: 16-Dec-1960, 55 y.o.   MRN: [id number]  Pt presents to the office today for CPE with pap. Pt is followed by Ortho every month who prescribes pain medication for her neck and low back pain. Pt is followed by GI annually.     Gynecologic Exam  The patient's pertinent negatives include no genital lesions or vaginal discharge. Associated symptoms include back pain. Pertinent negatives include no headaches.  Medication Refill  Associated symptoms include fatigue and neck pain. Pertinent negatives include no coughing, headaches or weakness.  Hypertension  This is a chronic problem. The current episode started more than 1 year ago.  The problem has been resolved since onset.  The problem is controlled. Associated symptoms include anxiety, neck pain, palpitations and shortness of breath ("at times'). Pertinent negatives include no headaches or peripheral edema. Risk factors for coronary artery disease include dyslipidemia, obesity, family history, post-menopausal state, smoking/tobacco exposure and sedentary lifestyle.  Past treatments include diuretics, ACE inhibitors and beta blockers. The current treatment provides moderate improvement. There is no history of kidney disease, CAD/MI, CVA or heart failure. There is no history of sleep apnea.  Gastroesophageal Reflux  She reports no coughing, no dysphagia, no heartburn or no stridor. This is a chronic problem. The current episode started more than 1 year ago. The problem occurs rarely. The problem has been resolved. The symptoms are aggravated by lying down and smoking. Associated symptoms include fatigue. Pertinent negatives include no weight loss. Risk factors include obesity and smoking/tobacco exposure. She has tried a PPI for the symptoms. The treatment provided significant relief.  Anxiety  Presents for follow-up visit. Symptoms include depressed mood, excessive worry, insomnia, irritability,  nervous/anxious behavior, palpitations and shortness of breath ("at times'). Patient reports no restlessness or suicidal ideas. Symptoms occur rarely. The quality of sleep is fair.    Depression         This is a chronic problem.  The current episode started more than 1 year ago.   The onset quality is gradual.   The problem occurs intermittently.  The problem has been resolved since onset.  Associated symptoms include fatigue and insomnia.  Associated symptoms include not irritable, no restlessness, no headaches, not sad and no suicidal ideas.  Compliance with treatment is good.  Previous treatment provided moderate relief.  Past medical history includes anxiety.   Back Pain  This is a chronic problem. The current episode started more than 1 year ago. The problem occurs intermittently. The problem has been waxing and waning since onset. The pain is present in the lumbar spine. The quality of the pain is described as aching.  The pain is at a severity of 7/10.  The pain is moderate. The symptoms are aggravated by bending and position. Pertinent negatives include no headaches, weakness or weight loss. She has tried analgesics and bed rest for the symptoms. The treatment provided moderate relief.  Neck Pain   This is a chronic problem. The current episode started more than 1 year ago. The problem occurs constantly.  The problem has been waxing and waning. The pain is associated with a remote injury. The pain is at a severity of 7/10. The pain is moderate. Pertinent negatives include no headaches, weakness or weight loss.  Insomnia  Primary symptoms: difficulty falling asleep, frequent awakening.  The current episode started more than one year. The onset quality is gradual. The problem occurs intermittently. The problem  has been resolved since onset. The treatment provided moderate relief.  PMH includes: depression.  Hyperlipidemia  Exacerbating diseases include obesity.  Associated symptoms include a  focal sensory loss and shortness of breath ("at times').      Review of Systems  Constitutional: Positive for fatigue and irritability. Negative for weight loss.  Respiratory: Positive for shortness of breath ("at times'). Negative for cough.   Cardiovascular: Positive for palpitations.  Gastrointestinal: Negative for dysphagia and heartburn.  Genitourinary: Negative for vaginal discharge.  Musculoskeletal: Positive for back pain and neck pain.  Neurological: Negative for weakness and headaches.  Psychiatric/Behavioral: Positive for depression. Negative for suicidal ideas. The patient is nervous/anxious and has insomnia.        Objective:   Physical Exam  Constitutional: She is oriented to person, place, and time. She appears well-developed and well-nourished. She is not irritable. No distress.  HENT:  Head: Normocephalic and atraumatic.  Right Ear: External ear normal.  Left Ear: External ear normal.  Nose: Nose normal.  Mouth/Throat: Oropharynx is clear and moist.  Eyes: Pupils are equal, round, and reactive to light.  Neck: Normal range of motion. Neck supple. No thyromegaly present.  Cardiovascular: Normal rate, regular rhythm, normal heart sounds and intact distal pulses.   No murmur heard. Pulmonary/Chest: Effort normal and breath sounds normal. No respiratory distress. She has no wheezes. Right breast exhibits no inverted nipple, no mass, no nipple discharge, no skin change and no tenderness. Left breast exhibits no inverted nipple, no mass, no nipple discharge, no skin change and no tenderness. Breasts are symmetrical.  Abdominal: Soft. Bowel sounds are normal. She exhibits no distension. There is no tenderness.  Genitourinary: Vagina normal.  Genitourinary Comments: Bimanual exam- no adnexal masses or tenderness, ovaries nonpalpable   Cervix not present- No discharge   Musculoskeletal: Normal range of motion. She exhibits no edema or tenderness.  Neurological: She is  alert and oriented to person, place, and time. She has normal reflexes. No cranial nerve deficit.  Skin: Skin is warm and dry.  Psychiatric: She has a normal mood and affect. Her behavior is normal. Judgment and thought content normal.  Vitals reviewed.   BP 112/75   Pulse 64   Temp 97.3 F (36.3 C) (Oral)   Ht '5\' 4"'  (1.626 m)   Wt 186 lb 9.6 oz (84.6 kg)   BMI 32.03 kg/m         Assessment & Plan:  1. Encounter for routine gynecological examination - Urinalysis, Complete - CMP14+EGFR - Pap IG (Image Guided)  2. Essential hypertension - CMP14+EGFR - Lipid panel - EKG 12-Lead - atenolol (TENORMIN) 50 MG tablet; TAKE ONE TABLET BY MOUTH ONCE DAILY WITH BREAKFAST  Dispense: 90 tablet; Refill: 1 - lisinopril-hydrochlorothiazide (PRINZIDE,ZESTORETIC) 20-25 MG tablet; Take 1 tablet by mouth daily.  Dispense: 90 tablet; Refill: 1 - Ambulatory referral to Cardiology  3. Osteopenia - CMP14+EGFR  4. GAD (generalized anxiety disorder) - CMP14+EGFR - ALPRAZolam (XANAX) 0.5 MG tablet; Take 1 tablet (0.5 mg total) by mouth 3 (three) times daily as needed.  Dispense: 90 tablet; Refill: 1 - citalopram (CELEXA) 40 MG tablet; Take 1 tablet (40 mg total) by mouth daily with breakfast.  Dispense: 90 tablet; Refill: 1  5. INSOMNIA - CMP14+EGFR - traZODone (DESYREL) 100 MG tablet; Take 1 tablet (100 mg total) by mouth at bedtime.  Dispense: 90 tablet; Refill: 1  6. Gastroesophageal reflux disease, esophagitis presence not specified - CMP14+EGFR  7. Depression - CMP14+EGFR - citalopram (CELEXA)  40 MG tablet; Take 1 tablet (40 mg total) by mouth daily with breakfast.  Dispense: 90 tablet; Refill: 1  8. Chronic bilateral low back pain, with sciatica presence unspecified - CMP14+EGFR  9. DEGENERATIVE DISC DISEASE, CERVICAL SPINE - CMP14+EGFR  10. Laboratory tests ordered as part of a complete physical exam (CPE) - CMP14+EGFR - Anemia Profile B - Lipid panel - Thyroid Panel With  TSH - VITAMIN D 25 Hydroxy (Vit-D Deficiency, Fractures) - Hepatitis C antibody  11. Hyperlipemia - CMP14+EGFR - Lipid panel  12. Other fatigue - CMP14+EGFR - Anemia Profile B - Thyroid Panel With TSH - VITAMIN D 25 Hydroxy (Vit-D Deficiency, Fractures)  13. Obesity (BMI 30-39.9)  14. Post-menopause - DG [facility] DEXA  15. Current smoker -Smoking cessation discussed - Ambulatory referral to Cardiology  16. Abnormal EKG - Ambulatory referral to Cardiology  Continue all meds Labs pending Health Maintenance reviewed Diet and exercise encouraged RTO 3 months  Evelina Dun, FNP

## 2016-07-02 NOTE — Patient Instructions (Signed)
Health Maintenance, Female Adopting a healthy lifestyle and getting preventive care can go a long way to promote health and wellness. Talk with your health care provider about what schedule of regular examinations is right for you. This is a good chance for you to check in with your provider about disease prevention and staying healthy. In between checkups, there are plenty of things you can do on your own. Experts have done a lot of research about which lifestyle changes and preventive measures are most likely to keep you healthy. Ask your health care provider for more information. WEIGHT AND DIET  Eat a healthy diet  Be sure to include plenty of vegetables, fruits, low-fat dairy products, and lean protein.  Do not eat a lot of foods high in solid fats, added sugars, or salt.  Get regular exercise. This is one of the most important things you can do for your health.  Most adults should exercise for at least 150 minutes each week. The exercise should increase your heart rate and make you sweat (moderate-intensity exercise).  Most adults should also do strengthening exercises at least twice a week. This is in addition to the moderate-intensity exercise.  Maintain a healthy weight  Body mass index (BMI) is a measurement that can be used to identify possible weight problems. It estimates body fat based on height and weight. Your health care provider can help determine your BMI and help you achieve or maintain a healthy weight.  For females 20 years of age and older:   A BMI below 18.5 is considered underweight.  A BMI of 18.5 to 24.9 is normal.  A BMI of 25 to 29.9 is considered overweight.  A BMI of 30 and above is considered obese.  Watch levels of cholesterol and blood lipids  You should start having your blood tested for lipids and cholesterol at 57 years of age, then have this test every 5 years.  You may need to have your cholesterol levels checked more often if:  Your lipid  or cholesterol levels are high.  You are older than 57 years of age.  You are at high risk for heart disease.  CANCER SCREENING   Lung Cancer  Lung cancer screening is recommended for adults 55-80 years old who are at high risk for lung cancer because of a history of smoking.  A yearly low-dose CT scan of the lungs is recommended for people who:  Currently smoke.  Have quit within the past 15 years.  Have at least a 30-pack-year history of smoking. A pack year is smoking an average of one pack of cigarettes a day for 1 year.  Yearly screening should continue until it has been 15 years since you quit.  Yearly screening should stop if you develop a health problem that would prevent you from having lung cancer treatment.  Breast Cancer  Practice breast self-awareness. This means understanding how your breasts normally appear and feel.  It also means doing regular breast self-exams. Let your health care provider know about any changes, no matter how small.  If you are in your 20s or 30s, you should have a clinical breast exam (CBE) by a health care provider every 1-3 years as part of a regular health exam.  If you are 40 or older, have a CBE every year. Also consider having a breast X-ray (mammogram) every year.  If you have a family history of breast cancer, talk to your health care provider about genetic screening.  If you   are at high risk for breast cancer, talk to your health care provider about having an MRI and a mammogram every year.  Breast cancer gene (BRCA) assessment is recommended for women who have family members with BRCA-related cancers. BRCA-related cancers include:  Breast.  Ovarian.  Tubal.  Peritoneal cancers.  Results of the assessment will determine the need for genetic counseling and BRCA1 and BRCA2 testing. Cervical Cancer Your health care provider may recommend that you be screened regularly for cancer of the pelvic organs (ovaries, uterus, and  vagina). This screening involves a pelvic examination, including checking for microscopic changes to the surface of your cervix (Pap test). You may be encouraged to have this screening done every 3 years, beginning at age 7.  For women ages 32-65, health care providers may recommend pelvic exams and Pap testing every 3 years, or they may recommend the Pap and pelvic exam, combined with testing for human papilloma virus (HPV), every 5 years. Some types of HPV increase your risk of cervical cancer. Testing for HPV may also be done on women of any age with unclear Pap test results.  Other health care providers may not recommend any screening for nonpregnant women who are considered low risk for pelvic cancer and who do not have symptoms. Ask your health care provider if a screening pelvic exam is right for you.  If you have had past treatment for cervical cancer or a condition that could lead to cancer, you need Pap tests and screening for cancer for at least 20 years after your treatment. If Pap tests have been discontinued, your risk factors (such as having a new sexual partner) need to be reassessed to determine if screening should resume. Some women have medical problems that increase the chance of getting cervical cancer. In these cases, your health care provider may recommend more frequent screening and Pap tests. Colorectal Cancer  This type of cancer can be detected and often prevented.  Routine colorectal cancer screening usually begins at 57 years of age and continues through 57 years of age.  Your health care provider may recommend screening at an earlier age if you have risk factors for colon cancer.  Your health care provider may also recommend using home test kits to check for hidden blood in the stool.  A small camera at the end of a tube can be used to examine your colon directly (sigmoidoscopy or colonoscopy). This is done to check for the earliest forms of colorectal  cancer.  Routine screening usually begins at age 54.  Direct examination of the colon should be repeated every 5-10 years through 57 years of age. However, you may need to be screened more often if early forms of precancerous polyps or small growths are found. Skin Cancer  Check your skin from head to toe regularly.  Tell your health care provider about any new moles or changes in moles, especially if there is a change in a mole's shape or color.  Also tell your health care provider if you have a mole that is larger than the size of a pencil eraser.  Always use sunscreen. Apply sunscreen liberally and repeatedly throughout the day.  Protect yourself by wearing long sleeves, pants, a wide-brimmed hat, and sunglasses whenever you are outside. HEART DISEASE, DIABETES, AND HIGH BLOOD PRESSURE   High blood pressure causes heart disease and increases the risk of stroke. High blood pressure is more likely to develop in:  People who have blood pressure in the high end  of the normal range (130-139/85-89 mm Hg).  People who are overweight or obese.  People who are African American.  If you are 38-23 years of age, have your blood pressure checked every 3-5 years. If you are 61 years of age or older, have your blood pressure checked every year. You should have your blood pressure measured twice--once when you are at a hospital or clinic, and once when you are not at a hospital or clinic. Record the average of the two measurements. To check your blood pressure when you are not at a hospital or clinic, you can use:  An automated blood pressure machine at a pharmacy.  A home blood pressure monitor.  If you are between 45 years and 39 years old, ask your health care provider if you should take aspirin to prevent strokes.  Have regular diabetes screenings. This involves taking a blood sample to check your fasting blood sugar level.  If you are at a normal weight and have a low risk for diabetes,  have this test once every three years after 57 years of age.  If you are overweight and have a high risk for diabetes, consider being tested at a younger age or more often. PREVENTING INFECTION  Hepatitis B  If you have a higher risk for hepatitis B, you should be screened for this virus. You are considered at high risk for hepatitis B if:  You were born in a country where hepatitis B is common. Ask your health care provider which countries are considered high risk.  Your parents were born in a high-risk country, and you have not been immunized against hepatitis B (hepatitis B vaccine).  You have HIV or AIDS.  You use needles to inject street drugs.  You live with someone who has hepatitis B.  You have had sex with someone who has hepatitis B.  You get hemodialysis treatment.  You take certain medicines for conditions, including cancer, organ transplantation, and autoimmune conditions. Hepatitis C  Blood testing is recommended for:  Everyone born from [birth year] through 1967.  Anyone with known risk factors for hepatitis C. Sexually transmitted infections (STIs)  You should be screened for sexually transmitted infections (STIs) including gonorrhea and chlamydia if:  You are sexually active and are younger than 57 years of age.  You are older than 57 years of age and your health care provider tells you that you are at risk for this type of infection.  Your sexual activity has changed since you were last screened and you are at an increased risk for chlamydia or gonorrhea. Ask your health care provider if you are at risk.  If you do not have HIV, but are at risk, it may be recommended that you take a prescription medicine daily to prevent HIV infection. This is called pre-exposure prophylaxis (PrEP). You are considered at risk if:  You are sexually active and do not regularly use condoms or know the HIV status of your partner(s).  You take drugs by injection.  You are sexually  active with a partner who has HIV. Talk with your health care provider about whether you are at high risk of being infected with HIV. If you choose to begin PrEP, you should first be tested for HIV. You should then be tested every 3 months for as long as you are taking PrEP.  PREGNANCY   If you are premenopausal and you may become pregnant, ask your health care provider about preconception counseling.  If you may  become pregnant, take 400 to 800 micrograms (mcg) of folic acid every day.  If you want to prevent pregnancy, talk to your health care provider about birth control (contraception). OSTEOPOROSIS AND MENOPAUSE   Osteoporosis is a disease in which the bones lose minerals and strength with aging. This can result in serious bone fractures. Your risk for osteoporosis can be identified using a bone density scan.  If you are 38 years of age or older, or if you are at risk for osteoporosis and fractures, ask your health care provider if you should be screened.  Ask your health care provider whether you should take a calcium or vitamin D supplement to lower your risk for osteoporosis.  Menopause may have certain physical symptoms and risks.  Hormone replacement therapy may reduce some of these symptoms and risks. Talk to your health care provider about whether hormone replacement therapy is right for you.  HOME CARE INSTRUCTIONS   Schedule regular health, dental, and eye exams.  Stay current with your immunizations.   Do not use any tobacco products including cigarettes, chewing tobacco, or electronic cigarettes.  If you are pregnant, do not drink alcohol.  If you are breastfeeding, limit how much and how often you drink alcohol.  Limit alcohol intake to no more than 1 drink per day for nonpregnant women. One drink equals 12 ounces of beer, 5 ounces of wine, or 1 ounces of hard liquor.  Do not use street drugs.  Do not share needles.  Ask your health care provider for help if  you need support or information about quitting drugs.  Tell your health care provider if you often feel depressed.  Tell your health care provider if you have ever been abused or do not feel safe at home.   This information is not intended to replace advice given to you by your health care provider. Make sure you discuss any questions you have with your health care provider.   Document Released: 05/11/2011 Document Revised: 11/16/2014 Document Reviewed: 09/27/2013 Elsevier Interactive Patient Education [employer].

## 2016-07-02 NOTE — Addendum Note (Signed)
Addended by: Evelina Dun A on: 07/02/2016 11:25 AM   Modules accepted: Orders

## 2016-07-03 ENCOUNTER — Other Ambulatory Visit: Payer: Self-pay | Admitting: Family

## 2016-07-03 LAB — CMP14+EGFR
ALBUMIN: 3.9 g/dL (ref 3.5–5.5)
ALK PHOS: 96 IU/L (ref 39–117)
ALT: 14 IU/L (ref 0–32)
AST: 12 IU/L (ref 0–40)
Albumin/Globulin Ratio: 1.5 (ref 1.2–2.2)
BUN / CREAT RATIO: 18 (ref 9–23)
BUN: 14 mg/dL (ref 6–24)
Bilirubin Total: 0.4 mg/dL (ref 0.0–1.2)
CO2: 31 mmol/L — AB (ref 18–29)
CREATININE: 0.78 mg/dL (ref 0.57–1.00)
Calcium: 9.8 mg/dL (ref 8.7–10.2)
Chloride: 96 mmol/L (ref 96–106)
GFR calc non Af Amer: 85 mL/min/{1.73_m2} (ref >59)
GFR, EST AFRICAN AMERICAN: 98 mL/min/{1.73_m2} (ref >59)
GLUCOSE: 98 mg/dL (ref 65–99)
Globulin, Total: 2.6 g/dL (ref 1.5–4.5)
Potassium: 4.2 mmol/L (ref 3.5–5.2)
Sodium: 142 mmol/L (ref 134–144)
TOTAL PROTEIN: 6.5 g/dL (ref 6.0–8.5)

## 2016-07-03 LAB — ANEMIA PROFILE B
BASOS ABS: 0 10*3/uL (ref 0.0–0.2)
BASOS: 0 %
EOS (ABSOLUTE): 0.4 10*3/uL (ref 0.0–0.4)
Eos: 3 %
FERRITIN: 61 ng/mL (ref 15–150)
FOLATE: 4 ng/mL (ref >3.0)
HEMATOCRIT: 43.7 % (ref 34.0–46.6)
Hemoglobin: 14.7 g/dL (ref 11.1–15.9)
IRON SATURATION: 22 % (ref 15–55)
IRON: 72 ug/dL (ref 27–159)
Immature Grans (Abs): 0 10*3/uL (ref 0.0–0.1)
Immature Granulocytes: 0 %
LYMPHS ABS: 4.2 10*3/uL — AB (ref 0.7–3.1)
Lymphs: 30 %
MCH: 29.9 pg (ref 26.6–33.0)
MCHC: 33.6 g/dL (ref 31.5–35.7)
MCV: 89 fL (ref 79–97)
Monocytes Absolute: 0.9 10*3/uL (ref 0.1–0.9)
Monocytes: 6 %
NEUTROS ABS: 8.3 10*3/uL — AB (ref 1.4–7.0)
Neutrophils: 61 %
PLATELETS: 301 10*3/uL (ref 150–379)
RBC: 4.92 x10E6/uL (ref 3.77–5.28)
RDW: 13.7 % (ref 12.3–15.4)
RETIC CT PCT: 1.8 % (ref 0.6–2.6)
TIBC: 326 ug/dL (ref 250–450)
UIBC: 254 ug/dL (ref 131–425)
VITAMIN B 12: 241 pg/mL (ref 211–946)
WBC: 13.8 10*3/uL — ABNORMAL HIGH (ref 3.4–10.8)

## 2016-07-03 LAB — THYROID PANEL WITH TSH
Free Thyroxine Index: 2.8 (ref 1.2–4.9)
T3 UPTAKE RATIO: 27 % (ref 24–39)
T4 TOTAL: 10.4 ug/dL (ref 4.5–12.0)
TSH: 1.14 u[IU]/mL (ref 0.450–4.500)

## 2016-07-03 LAB — LIPID PANEL
CHOL/HDL RATIO: 5 ratio — AB (ref 0.0–4.4)
CHOLESTEROL TOTAL: 199 mg/dL (ref 100–199)
HDL: 40 mg/dL (ref >39)
LDL CALC: 123 mg/dL — AB (ref 0–99)
Triglycerides: 182 mg/dL — ABNORMAL HIGH (ref 0–149)
VLDL CHOLESTEROL CAL: 36 mg/dL (ref 5–40)

## 2016-07-03 LAB — HEPATITIS C ANTIBODY

## 2016-07-03 LAB — VITAMIN D 25 HYDROXY (VIT D DEFICIENCY, FRACTURES): Vit D, 25-Hydroxy: 26 ng/mL — ABNORMAL LOW (ref 30.0–100.0)

## 2016-07-03 MED ORDER — VITAMIN D (ERGOCALCIFEROL) 1.25 MG (50000 UNIT) PO CAPS: 50000.0000 [IU] | ORAL_CAPSULE | ORAL | 3 refills | Status: DC

## 2016-07-03 MED ORDER — ATORVASTATIN CALCIUM 20 MG PO TABS: 20.0000 mg | ORAL_TABLET | Freq: Every day | ORAL | 1 refills | Status: DC

## 2016-07-05 LAB — PAP IG (IMAGE GUIDED): PAP Smear Comment: 0

## 2016-07-08 ENCOUNTER — Telehealth: Payer: Self-pay | Admitting: Family

## 2016-07-09 ENCOUNTER — Other Ambulatory Visit: Payer: Self-pay | Admitting: Family

## 2016-07-09 ENCOUNTER — Encounter (HOSPITAL_COMMUNITY): Payer: Self-pay | Admitting: *Deleted

## 2016-07-09 ENCOUNTER — Emergency Department (HOSPITAL_COMMUNITY): Payer: Medicare HMO

## 2016-07-09 ENCOUNTER — Observation Stay (HOSPITAL_COMMUNITY)
Admission: EM | Admit: 2016-07-09 | Discharge: 2016-07-11 | Disposition: A | Discharge status: Home / Self Care | Payer: Medicare HMO | Attending: Internal Medicine | Admitting: Internal Medicine

## 2016-07-09 ENCOUNTER — Other Ambulatory Visit: Payer: Self-pay

## 2016-07-09 DIAGNOSIS — N179 Acute kidney failure, unspecified: Secondary | ICD-10-CM

## 2016-07-09 DIAGNOSIS — K219 Gastro-esophageal reflux disease without esophagitis: Secondary | ICD-10-CM | POA: Diagnosis not present

## 2016-07-09 DIAGNOSIS — G4730 Sleep apnea, unspecified: Secondary | ICD-10-CM | POA: Insufficient documentation

## 2016-07-09 DIAGNOSIS — M1990 Unspecified osteoarthritis, unspecified site: Secondary | ICD-10-CM | POA: Insufficient documentation

## 2016-07-09 DIAGNOSIS — E860 Dehydration: Secondary | ICD-10-CM

## 2016-07-09 DIAGNOSIS — F329 Major depressive disorder, single episode, unspecified: Secondary | ICD-10-CM | POA: Diagnosis not present

## 2016-07-09 DIAGNOSIS — F411 Generalized anxiety disorder: Secondary | ICD-10-CM | POA: Diagnosis not present

## 2016-07-09 DIAGNOSIS — R55 Syncope and collapse: Secondary | ICD-10-CM | POA: Diagnosis not present

## 2016-07-09 DIAGNOSIS — F17210 Nicotine dependence, cigarettes, uncomplicated: Secondary | ICD-10-CM | POA: Diagnosis not present

## 2016-07-09 DIAGNOSIS — N390 Urinary tract infection, site not specified: Secondary | ICD-10-CM

## 2016-07-09 DIAGNOSIS — I10 Essential (primary) hypertension: Secondary | ICD-10-CM | POA: Diagnosis not present

## 2016-07-09 DIAGNOSIS — I959 Hypotension, unspecified: Secondary | ICD-10-CM | POA: Diagnosis present

## 2016-07-09 DIAGNOSIS — Z79899 Other long term (current) drug therapy: Secondary | ICD-10-CM | POA: Diagnosis not present

## 2016-07-09 DIAGNOSIS — E876 Hypokalemia: Secondary | ICD-10-CM | POA: Diagnosis not present

## 2016-07-09 LAB — COMPREHENSIVE METABOLIC PANEL
ALBUMIN: 3.2 g/dL — AB (ref 3.5–5.0)
ALK PHOS: 77 U/L (ref 38–126)
ALT: 23 U/L (ref 14–54)
AST: 24 U/L (ref 15–41)
Anion gap: 7 (ref 5–15)
BILIRUBIN TOTAL: 0.5 mg/dL (ref 0.3–1.2)
BUN: 22 mg/dL — AB (ref 6–20)
CALCIUM: 7.9 mg/dL — AB (ref 8.9–10.3)
CO2: 27 mmol/L (ref 22–32)
CREATININE: 1.51 mg/dL — AB (ref 0.44–1.00)
Chloride: 101 mmol/L (ref 101–111)
GFR calc Af Amer: 43 mL/min — ABNORMAL LOW (ref >60)
GFR, EST NON AFRICAN AMERICAN: 37 mL/min — AB (ref >60)
GLUCOSE: 108 mg/dL — AB (ref 65–99)
POTASSIUM: 3 mmol/L — AB (ref 3.5–5.1)
Sodium: 135 mmol/L (ref 135–145)
TOTAL PROTEIN: 5.9 g/dL — AB (ref 6.5–8.1)

## 2016-07-09 LAB — URINALYSIS, ROUTINE W REFLEX MICROSCOPIC
GLUCOSE, UA: NEGATIVE mg/dL
Hgb urine dipstick: NEGATIVE
LEUKOCYTES UA: NEGATIVE
NITRITE: NEGATIVE
PH: 5.5 (ref 5.0–8.0)
Protein, ur: 30 mg/dL — AB

## 2016-07-09 LAB — TSH: TSH: 1.136 u[IU]/mL (ref 0.350–4.500)

## 2016-07-09 LAB — CBC WITH DIFFERENTIAL/PLATELET
BASOS PCT: 0 %
Basophils Absolute: 0 10*3/uL (ref 0.0–0.1)
EOS ABS: 0.3 10*3/uL (ref 0.0–0.7)
EOS PCT: 2 %
HCT: 43.2 % (ref 36.0–46.0)
HEMOGLOBIN: 14 g/dL (ref 12.0–15.0)
LYMPHS ABS: 3.2 10*3/uL (ref 0.7–4.0)
Lymphocytes Relative: 22 %
MCH: 29.9 pg (ref 26.0–34.0)
MCHC: 32.4 g/dL (ref 30.0–36.0)
MCV: 92.1 fL (ref 78.0–100.0)
MONO ABS: 1.5 10*3/uL — AB (ref 0.1–1.0)
MONOS PCT: 10 %
Neutro Abs: 9.8 10*3/uL — ABNORMAL HIGH (ref 1.7–7.7)
Neutrophils Relative %: 66 %
PLATELETS: 270 10*3/uL (ref 150–400)
RBC: 4.69 MIL/uL (ref 3.87–5.11)
RDW: 13.2 % (ref 11.5–15.5)
WBC: 14.8 10*3/uL — ABNORMAL HIGH (ref 4.0–10.5)

## 2016-07-09 LAB — URINE MICROSCOPIC-ADD ON: RBC / HPF: NONE SEEN RBC/hpf (ref 0–5)

## 2016-07-09 LAB — RAPID URINE DRUG SCREEN, HOSP PERFORMED
AMPHETAMINES: NOT DETECTED
Amphetamines: NOT DETECTED
BARBITURATES: NOT DETECTED
BARBITURATES: NOT DETECTED
Benzodiazepines: POSITIVE — AB
Benzodiazepines: POSITIVE — AB
COCAINE: NOT DETECTED
Cocaine: NOT DETECTED
OPIATES: POSITIVE — AB
Opiates: POSITIVE — AB
TETRAHYDROCANNABINOL: NOT DETECTED
Tetrahydrocannabinol: NOT DETECTED

## 2016-07-09 LAB — D-DIMER, QUANTITATIVE: D-Dimer, Quant: 0.42 ug/mL-FEU (ref 0.00–0.50)

## 2016-07-09 LAB — TROPONIN I

## 2016-07-09 MED ORDER — GABAPENTIN 300 MG PO CAPS
600.0000 mg | ORAL_CAPSULE | Freq: Three times a day (TID) | ORAL | Status: DC
  Administered 2016-07-09 – 2016-07-11 (×5): 600 mg via ORAL
  Filled 2016-07-09 (×5): qty 2

## 2016-07-09 MED ORDER — DEXTROSE 5 % IV SOLN: 1.0000 g | INTRAVENOUS | Status: DC

## 2016-07-09 MED ORDER — ONDANSETRON HCL 4 MG PO TABS
4.0000 mg | ORAL_TABLET | Freq: Four times a day (QID) | ORAL | Status: DC | PRN
  Administered 2016-07-10 (×2): 4 mg via ORAL
  Filled 2016-07-09 (×2): qty 1

## 2016-07-09 MED ORDER — GEMFIBROZIL 600 MG PO TABS: 600.0000 mg | ORAL_TABLET | Freq: Two times a day (BID) | ORAL | 1 refills | Status: DC

## 2016-07-09 MED ORDER — SODIUM CHLORIDE 0.9 % IV SOLN
INTRAVENOUS | Status: DC
  Administered 2016-07-09 – 2016-07-11 (×4): via INTRAVENOUS

## 2016-07-09 MED ORDER — SODIUM CHLORIDE 0.9 % IV BOLUS (SEPSIS)
1000.0000 mL | Freq: Once | INTRAVENOUS | Status: AC
  Administered 2016-07-09: 1000 mL via INTRAVENOUS

## 2016-07-09 MED ORDER — SODIUM CHLORIDE 0.9 % IV SOLN
INTRAVENOUS | Status: DC
  Administered 2016-07-09: 16:00:00 via INTRAVENOUS

## 2016-07-09 MED ORDER — POTASSIUM CHLORIDE CRYS ER 20 MEQ PO TBCR
40.0000 meq | EXTENDED_RELEASE_TABLET | Freq: Once | ORAL | Status: DC
  Filled 2016-07-09: qty 2

## 2016-07-09 MED ORDER — ONDANSETRON HCL 4 MG/2ML IJ SOLN
4.0000 mg | Freq: Four times a day (QID) | INTRAMUSCULAR | Status: DC | PRN
Start: 2016-07-09 — End: 2016-07-11

## 2016-07-09 MED ORDER — ENOXAPARIN SODIUM 40 MG/0.4ML ~~LOC~~ SOLN
40.0000 mg | SUBCUTANEOUS | Status: DC
  Administered 2016-07-09 – 2016-07-10 (×2): 40 mg via SUBCUTANEOUS
  Filled 2016-07-09 (×2): qty 0.4

## 2016-07-09 MED ORDER — ACETAMINOPHEN 650 MG RE SUPP: 650.0000 mg | Freq: Four times a day (QID) | RECTAL | Status: DC | PRN

## 2016-07-09 MED ORDER — POTASSIUM CHLORIDE 10 MEQ/100ML IV SOLN
10.0000 meq | INTRAVENOUS | Status: AC
  Administered 2016-07-09 – 2016-07-10 (×3): 10 meq via INTRAVENOUS
  Filled 2016-07-09 (×2): qty 100

## 2016-07-09 MED ORDER — SODIUM CHLORIDE 0.9 % IV SOLN
INTRAVENOUS | Status: AC
  Administered 2016-07-09: via INTRAVENOUS

## 2016-07-09 MED ORDER — ACETAMINOPHEN 325 MG PO TABS: 650.0000 mg | ORAL_TABLET | Freq: Four times a day (QID) | ORAL | Status: DC | PRN

## 2016-07-09 MED ORDER — DEXTROSE 5 % IV SOLN
1.0000 g | Freq: Once | INTRAVENOUS | Status: AC
  Administered 2016-07-09: 1 g via INTRAVENOUS
  Filled 2016-07-09: qty 10

## 2016-07-09 MED ORDER — HYDROCODONE-ACETAMINOPHEN 10-325 MG PO TABS
1.0000 | ORAL_TABLET | Freq: Four times a day (QID) | ORAL | Status: DC | PRN
  Administered 2016-07-10 – 2016-07-11 (×3): 2 via ORAL
  Filled 2016-07-09 (×3): qty 2

## 2016-07-09 MED ORDER — CITALOPRAM HYDROBROMIDE 20 MG PO TABS
40.0000 mg | ORAL_TABLET | Freq: Every day | ORAL | Status: DC
  Administered 2016-07-10 – 2016-07-11 (×2): 40 mg via ORAL
  Filled 2016-07-09 (×2): qty 2

## 2016-07-09 MED ORDER — PANTOPRAZOLE SODIUM 40 MG PO TBEC
40.0000 mg | DELAYED_RELEASE_TABLET | Freq: Two times a day (BID) | ORAL | Status: DC
  Administered 2016-07-09 – 2016-07-11 (×4): 40 mg via ORAL
  Filled 2016-07-09 (×4): qty 1

## 2016-07-09 NOTE — ED Triage Notes (Signed)
Pt comes in by EMS for syncopal episode. She was getting up from a lying position when she became dizzy and then lost consciousness. Pt has abrasion to her right arm. Unsure if she hit her head.  Pt went to PCP last week when she was told she had a change in her EKG. She made an appt with a cardiologist in September.

## 2016-07-09 NOTE — ED Notes (Signed)
Pt states she took BP meds today.

## 2016-07-09 NOTE — Telephone Encounter (Signed)
She is not the appropriate patient for lopid, that is for elevated triglycerides, not cholesterol.   We can use cheaper alternatives to lipitor if that is the purpose.   Laroy Apple, MD [facility] 07/09/2016, 7:50 AM

## 2016-07-09 NOTE — ED Provider Notes (Signed)
[facility] DEPT Provider Note   CSN: [id number] Arrival date & time: 07/09/16  1547     History   Chief Complaint Chief Complaint  Patient presents with  . Loss of Consciousness    HPI [name] is a 55 y.o. female.  HPI  Pt was seen at 1600. Per pt, c/o sudden onset and resolution of one episode of syncope that occurred PTA. Pt states she was standing up from a lying position when she felt lightheaded and then "passed out." Pt states she has had intermittent lightheadedness for the past 1 to 2 weeks. Pt saw her PMD last week, had routine labs/EKG, and was told her EKG "was different than the last one" and she needed to f/u with Cards MD. Pt recently started Vit D, but denies any other changes in her usual medications. Denies CP/palpitations, no SOB/cough, no abd pain, no N/V/D, no fevers, no rash, no focal motor weakness, no tingling/numbness in extremities.    Past Medical History:  Diagnosis Date  . Abdominal pain   . Anxiety   . Arthritis   . Blood in stool   . Breast changes, fibrocystic   . Bruises easily   . Clotting disorder (HCC)     itp , none since 1976, no current hematologist  . Constipation   . DDD (degenerative disc disease), cervical    with lumbar issues  . Depression   . Generalized headaches   . GERD (gastroesophageal reflux disease)   . Hemorrhoid   . Hyperlipidemia   . Hypertension   . Idiopathic thrombocytopenia ([place])    as child  . Leg swelling    both ankles  . Liver lesion   . Nasal congestion   . Nausea & vomiting   . Neuromuscular disorder ([place])    back  injury  . Neuromuscular disorder ([place])    3 neck surgeries,neck fusion.pin,plates,screws  . Osteoporosis   . Panic attacks    pt also  has germaphobia  . Rectal bleeding   . Seizures ([place]) 1998   stress induced, one time, none since, no seizure meds  . Seizures ([place])    Stated she had 2 weeks ago,it was like a transient,stare.  . Sleep apnea    STOPBANG=4  . Spleen  enlarged 1976, none since   r/t idiopathic thrombocytopenia  . Trouble swallowing   . Unintentional weight loss   . Weakness    weakness varies    Patient Active Problem List   Diagnosis Date Noted  . Obesity (BMI 30-39.9) 07/02/2016  . Osteopenia 12/20/2013  . Diverticulosis of colon (without mention of hemorrhage) 02/04/2012  . GERD (gastroesophageal reflux disease) 02/04/2012  . Dysphagia 02/04/2012  . Unspecified gastritis and gastroduodenitis without mention of hemorrhage 02/04/2012  . Cystic liver mass 01/26/2012  . Cholecystitis chronic 01/26/2012  . SYNCOPE 12/20/2007  . GAD (generalized anxiety disorder) 01/25/2007  . Depression 01/25/2007  . Essential hypertension 01/25/2007  . BREAST MASSES, BILATERAL 01/25/2007  . DEGENERATIVE DISC DISEASE, CERVICAL SPINE 01/25/2007  . LOW BACK PAIN 01/25/2007  . SEIZURE DISORDER 01/25/2007  . INSOMNIA 01/25/2007  . DEPENDENT EDEMA, LEGS, BILATERAL 01/25/2007  . Current smoker 01/25/2007  . TAH/BSO, HX OF 01/25/2007    Past Surgical History:  Procedure Laterality Date  . ABDOMINAL HYSTERECTOMY  1997  . CERVICAL SPINE SURGERY  2005, 2007, 2010   x3, fusion done,plates and screws present  . [place]  . CHOLECYSTECTOMY  03/14/2012   Procedure: LAPAROSCOPIC CHOLECYSTECTOMY;  Surgeon: Stark Klein, MD;  Location: [facility] ORS;  Service: General;  Laterality: N/A;  . colonscopy and esophagogastrodudononescopy  02-04-12  . LUMBAR DISC SURGERY  11/2014       Home Medications    Prior to Admission medications   Medication Sig Start Date End Date Taking? Authorizing Provider  ALPRAZolam Duanne Moron) 0.5 MG tablet Take 1 tablet (0.5 mg total) by mouth 3 (three) times daily as needed. 07/02/16  Yes Sharion Balloon, FNP  atenolol (TENORMIN) 50 MG tablet TAKE ONE TABLET BY MOUTH ONCE DAILY WITH BREAKFAST 07/02/16  Yes Sharion Balloon, FNP  citalopram (CELEXA) 40 MG tablet Take 1 tablet (40 mg total) by mouth daily with  breakfast. 07/02/16  Yes Sharion Balloon, FNP  gabapentin (NEURONTIN) 300 MG capsule Take 600 mg by mouth 3 (three) times daily.  09/28/13  Yes Historical Provider, MD  gemfibrozil (LOPID) 600 MG tablet Take 1 tablet (600 mg total) by mouth 2 (two) times daily before a meal. 07/09/16  Yes Sharion Balloon, FNP  HYDROcodone-acetaminophen (NORCO) 10-325 MG per tablet Take 1-2 tablets by mouth every 6 (six) hours as needed for pain. For pain 03/17/12  Yes Stark Klein, MD  lisinopril-hydrochlorothiazide (PRINZIDE,ZESTORETIC) 20-25 MG tablet Take 1 tablet by mouth daily. 07/02/16  Yes Sharion Balloon, FNP  pantoprazole (PROTONIX) 40 MG tablet Take 1 tablet (40 mg total) by mouth 2 (two) times daily. 11/06/15  Yes Milus Banister, MD  traZODone (DESYREL) 100 MG tablet Take 1 tablet (100 mg total) by mouth at bedtime. 07/02/16  Yes Sharion Balloon, FNP  Vitamin D, Ergocalciferol, (DRISDOL) 50000 units CAPS capsule Take 1 capsule (50,000 Units total) by mouth every 7 (seven) days. 07/03/16  Yes Sharion Balloon, FNP  VOLTAREN 1 % GEL Apply 2 g topically 4 (four) times daily as needed.  07/24/14  Yes Historical Provider, MD  metoCLOPramide (REGLAN) 10 MG tablet Take 1 tablet (10 mg total) by mouth at bedtime. 11/19/15   Milus Banister, MD  ondansetron (ZOFRAN) 4 MG tablet Take 1 tablet by mouth daily as needed. 04/02/16   Historical Provider, MD    Family History Family History  Problem Relation Age of Onset  . Hypertension Mother   . Heart disease Mother   . Lung cancer Father   . Cancer Father     lung  . Colon cancer Paternal Grandmother   . Cancer Paternal Grandmother     colon  . Heart disease Maternal Grandmother   . Diabetes Sister     Social History Social History  Substance Use Topics  . Smoking status: Current Every Day Smoker    Packs/day: 1.00    Years: 5.00    Types: Cigarettes    Start date: 12/20/2009  . Smokeless tobacco: Never Used     Comment: VAPOR- OCCASIONALLY  . Alcohol use No      Allergies   Nsaids   Review of Systems Review of Systems ROS: Statement: All systems negative except as marked or noted in the HPI; Constitutional: Negative for fever and chills. ; ; Eyes: Negative for eye pain, redness and discharge. ; ; ENMT: Negative for ear pain, hoarseness, nasal congestion, sinus pressure and sore throat. ; ; Cardiovascular: Negative for chest pain, palpitations, diaphoresis, dyspnea and peripheral edema. ; ; Respiratory: Negative for cough, wheezing and stridor. ; ; Gastrointestinal: Negative for nausea, vomiting, diarrhea, abdominal pain, blood in stool, hematemesis, jaundice and rectal bleeding. . ; ; Genitourinary: Negative for dysuria,  flank pain and hematuria. ; ; Musculoskeletal: Negative for back pain and neck pain. Negative for swelling and trauma.; ; Skin: Negative for pruritus, rash, abrasions, blisters, bruising and skin lesion.; ; Neuro: Negative for headache and neck stiffness. Negative for weakness, extremity weakness, paresthesias, involuntary movement, seizure and +lightheadedness, +syncope.       Physical Exam Updated Vital Signs BP 101/60   Pulse 72   Temp 98.7 F (37.1 C) (Oral)   Resp 17   Ht 5\' 4"  (1.626 m)   Wt 186 lb (84.4 kg)   SpO2 94%   BMI 31.93 kg/m    Patient Vitals for the past 24 hrs:  BP Temp Temp src Pulse Resp SpO2 Height Weight  07/09/16 2040 117/79 - - 67 14 93 % - -  07/09/16 2020 105/80 - - 66 16 91 % - -  07/09/16 2006 92/72 - - 67 16 95 % - -  07/09/16 1905 102/67 - - 72 (!) 27 91 % - -  07/09/16 1901 105/65 - - 71 13 94 % - -  07/09/16 1900 106/72 - - 74 20 94 % - -  07/09/16 1850 95/61 - - 72 15 (!) 89 % - -  07/09/16 1830 99/67 - - 73 14 93 % - -  07/09/16 1810 90/61 - - 74 13 91 % - -  07/09/16 1800 95/55 - - 73 18 91 % - -  07/09/16 1750 97/55 - - 72 21 91 % - -  07/09/16 1740 101/60 - - 72 17 94 % - -  07/09/16 1735 97/60 - - 71 16 93 % - -  07/09/16 1710 92/74 - - 69 19 94 % - -  07/09/16 1650 97/66  - - 68 14 93 % - -  07/09/16 1630 (!) 93/51 - - 69 17 94 % - -  07/09/16 1625 91/62 - - 69 17 94 % - -  07/09/16 1620 (!) 86/59 - - 67 15 92 % - -  07/09/16 1600 (!) 79/52 - - 66 18 91 % - -  07/09/16 1558 (!) 84/57 - - - - - - -  07/09/16 1557 - - - - - - 5\' 4"  (1.626 m) 186 lb (84.4 kg)  07/09/16 1556 - 98.7 F (37.1 C) Oral 65 16 93 % - -    19:06 Orthostatic Vital Signs VH  Orthostatic Lying   BP- Lying: 105/65  Pulse- Lying: 72      Orthostatic Sitting  BP- Sitting: 102/67  Pulse- Sitting: 72     Physical Exam 1605: Physical examination:  Nursing notes reviewed; Vital signs and O2 SAT reviewed;  Constitutional: Well developed, Well nourished, Well hydrated, In no acute distress; Head:  Normocephalic, atraumatic; Eyes: EOMI, PERRL, No scleral icterus; ENMT: Mouth and pharynx normal, Mucous membranes moist; Neck: Supple, Full range of motion, No lymphadenopathy; Cardiovascular: Regular rate and rhythm, No gallop; Respiratory: Breath sounds clear & equal bilaterally, No wheezes.  Speaking full sentences with ease, Normal respiratory effort/excursion; Chest: Nontender, Movement normal; Abdomen: Soft, Nontender, Nondistended, Normal bowel sounds; Genitourinary: No CVA tenderness; Extremities: Pulses normal, No tenderness, No edema, No calf edema or asymmetry.; Neuro: AA&Ox3, Major CN grossly intact. No facial droop. Speech clear. Grips equal. Strength 5/5 equal bilat UE's and LE's. No gross focal motor or sensory deficits in extremities.; Skin: Color normal, Warm, Dry.   ED Treatments / Results  Labs (all labs ordered are listed, but only abnormal results are displayed)  EKG  EKG Interpretation  Date/Time:  Thursday July 09 2016 16:16:17 EDT Ventricular Rate:  68 PR Interval:    QRS Duration: 109 QT Interval:  444 QTC Calculation: 473 R Axis:   49 Text Interpretation:  Sinus rhythm RSR' in V1 or V2, right VCD or RVH Nonspecific ST and T wave abnormality Anterolateral  leads Baseline wander When compared with ECG of 02/03/2012 Nonspecific ST and T wave abnormality is now Present Confirmed by [facility]  MD, Nunzio Cory [phone]) on 07/09/2016 4:44:31 PM       Radiology   Procedures Procedures (including critical care time)  Medications Ordered in ED Medications  0.9 %  sodium chloride infusion ( Intravenous New Bag/Given 07/09/16 1620)  sodium chloride 0.9 % bolus 1,000 mL (0 mLs Intravenous Stopped 07/09/16 1639)  sodium chloride 0.9 % bolus 1,000 mL (1,000 mLs Intravenous New Bag/Given 07/09/16 1646)     Initial Impression / Assessment and Plan / ED Course  I have reviewed the triage vital signs and the nursing notes.  Pertinent labs & imaging results that were available during my care of the patient were reviewed by me and considered in my medical decision making (see chart for details).  MDM Reviewed: nursing note, vitals and previous chart Reviewed previous: labs and ECG Interpretation: labs, x-ray, ECG, MRI and CT scan Total time providing critical care: 30-74 minutes. This excludes time spent performing separately reportable procedures and services. Consults: admitting MD and neurology   CRITICAL CARE Performed by: Alfonzo Feller Total critical care time: 35 minutes Critical care time was exclusive of separately billable procedures and treating other patients. Critical care was necessary to treat or prevent imminent or life-threatening deterioration. Critical care was time spent personally by me on the following activities: development of treatment plan with patient and/or surrogate as well as nursing, discussions with consultants, evaluation of patient's response to treatment, examination of patient, obtaining history from patient or surrogate, ordering and performing treatments and interventions, ordering and review of laboratory studies, ordering and review of radiographic studies, pulse oximetry and re-evaluation of patient's  condition.   Results for orders placed or performed during the hospital encounter of 07/09/16  Urinalysis, Routine w reflex microscopic  Result Value Ref Range   Color, Urine YELLOW YELLOW   APPearance CLEAR CLEAR   Specific Gravity, Urine >1.030 (H) 1.005 - 1.030   pH 5.5 5.0 - 8.0   Glucose, UA NEGATIVE NEGATIVE mg/dL   Hgb urine dipstick NEGATIVE NEGATIVE   Bilirubin Urine SMALL (A) NEGATIVE   Ketones, ur TRACE (A) NEGATIVE mg/dL   Protein, ur 30 (A) NEGATIVE mg/dL   Nitrite NEGATIVE NEGATIVE   Leukocytes, UA NEGATIVE NEGATIVE  Comprehensive metabolic panel  Result Value Ref Range   Sodium 135 135 - 145 mmol/L   Potassium 3.0 (L) 3.5 - 5.1 mmol/L   Chloride 101 101 - 111 mmol/L   CO2 27 22 - 32 mmol/L   Glucose, Bld 108 (H) 65 - 99 mg/dL   BUN 22 (H) 6 - 20 mg/dL   Creatinine, Ser 1.51 (H) 0.44 - 1.00 mg/dL   Calcium 7.9 (L) 8.9 - 10.3 mg/dL   Total Protein 5.9 (L) 6.5 - 8.1 g/dL   Albumin 3.2 (L) 3.5 - 5.0 g/dL   AST 24 15 - 41 U/L   ALT 23 14 - 54 U/L   Alkaline Phosphatase 77 38 - 126 U/L   Total Bilirubin 0.5 0.3 - 1.2 mg/dL   GFR calc non Af Amer 37 (L) >  60 mL/min   GFR calc Af Amer 43 (L) >60 mL/min   Anion gap 7 5 - 15  Troponin I  Result Value Ref Range   Troponin I <0.03 <0.03 ng/mL  D-dimer, quantitative  Result Value Ref Range   D-Dimer, Quant 0.42 0.00 - 0.50 ug/mL-FEU  CBC with Differential  Result Value Ref Range   WBC 14.8 (H) 4.0 - 10.5 K/uL   RBC 4.69 3.87 - 5.11 MIL/uL   Hemoglobin 14.0 12.0 - 15.0 g/dL   HCT 43.2 36.0 - 46.0 %   MCV 92.1 78.0 - 100.0 fL   MCH 29.9 26.0 - 34.0 pg   MCHC 32.4 30.0 - 36.0 g/dL   RDW 13.2 11.5 - 15.5 %   Platelets 270 150 - 400 K/uL   Neutrophils Relative % 66 %   Neutro Abs 9.8 (H) 1.7 - 7.7 K/uL   Lymphocytes Relative 22 %   Lymphs Abs 3.2 0.7 - 4.0 K/uL   Monocytes Relative 10 %   Monocytes Absolute 1.5 (H) 0.1 - 1.0 K/uL   Eosinophils Relative 2 %   Eosinophils Absolute 0.3 0.0 - 0.7 K/uL   Basophils  Relative 0 %   Basophils Absolute 0.0 0.0 - 0.1 K/uL  Urine microscopic-add on  Result Value Ref Range   Squamous Epithelial / LPF 6-30 (A) NONE SEEN   WBC, UA 6-30 0 - 5 WBC/hpf   RBC / HPF NONE SEEN 0 - 5 RBC/hpf   Bacteria, UA MANY (A) NONE SEEN   Dg Chest 1 View Result Date: 07/09/2016 CLINICAL DATA:  Syncope, smoking his EXAM: CHEST 1 VIEW COMPARISON:  Chest x-ray of 05/16/2015 FINDINGS: No active infiltrate or effusion is seen. Mediastinal and hilar contours are unremarkable. There is some prominence to the right paratracheal region probably vascular due to the semi upright position in which the film was taken. Attention to this area on followup chest x-ray is recommended. The heart is mildly enlarged. Hardware for both anterior and posterior fusion of the cervical spine is noted. IMPRESSION: 1. No active infiltrate or effusion. 2. Prominence of the right paratracheal soft tissues probably is vascular in origin. Recommend attention to this area on followup chest x-ray. Electronically Signed   By: Ivar Drape M.D.   On: 07/09/2016 17:05   Ct Head Wo Contrast Result Date: 07/09/2016 CLINICAL DATA:  Syncopal episode today. Patient started a new blood pressure medication today. EXAM: CT HEAD WITHOUT CONTRAST TECHNIQUE: Contiguous axial images were obtained from the base of the skull through the vertex without intravenous contrast. COMPARISON:  None. FINDINGS: Brain: Focal increased density is identified in the right basal ganglia with Hounsfield unit of 62. There is no midline shift or hydrocephalus. No acute transcortical infarct is identified. No focal mass is identified. Vascular: No hyperdense vessels are noted. Skull: Intact. Sinuses/Orbits: Mucoperiosteal thickening of bilateral ethmoid, sphenoid sinuses are identified. Other: None. IMPRESSION: Small focal increased density is identified in the right basal ganglia with Hounsfield unit of 62, focal hemorrhage is not excluded. No mass effect is  identified. These results will be called to the ordering clinician or representative by the Radiologist Assistant, and communication documented in the PACS or zVision Dashboard. Electronically Signed   By: Abelardo Diesel M.D.   On: 07/09/2016 17:46   Mr Brain Wo Contrast (neuro Protocol) Result Date: 07/09/2016 CLINICAL DATA:  55 y/o F; syncopal episode today. Question of hemorrhage in right basal ganglia. EXAM: MRI HEAD WITHOUT CONTRAST MRA HEAD WITHOUT CONTRAST TECHNIQUE:  Axial and coronal DWI sequences of the brain was obtained without intravenous contrast. Angiographic images of the head were obtained using MRA technique without contrast. COMPARISON:  07/09/2016 CT of the head. 04/10/2013 MRI of the brain. FINDINGS: MRI HEAD FINDINGS Brain: No diffusion restriction to suggest acute infarct. No abnormal susceptibility hypointensity to indicate intracranial hemorrhage. No significant T2 FLAIR signal abnormality. No focal mass effect. Question hemorrhage within basal ganglia on CT has signal characteristics consistent with globus pallidus mineralization, benign finding. Extra-axial space: No hydrocephalus. No extra-axial collection is identified. Proximal intracranial flow voids are maintained. Other: Moderate diffuse paranasal sinus mucosal thickening and small fluid levels in anterior ethmoid air cells. No abnormal signal of the mastoid air cells. Orbits are unremarkable. Calvarium is unremarkable. MRA HEAD FINDINGS Internal carotid arteries:  Patent. Anterior cerebral arteries:  Patent. Middle cerebral arteries: Patent. Anterior communicating artery: Patent. Posterior communicating arteries: Large left identified. No right identified, hypoplastic or absent. Posterior cerebral arteries:  Patent. Basilar artery:  Patent. Vertebral arteries:  Patent. No occlusion, aneurysm, go or significant stenosis is identified. IMPRESSION: 1. No acute intracranial abnormality is identified. Questioned hemorrhage within basal  ganglia on CT has signal characteristics consistent with globus pallidus mineralization, benign finding. 2. Patent circle of Willis. No occlusion, aneurysm, or significant stenosis is identified. 3. Moderate paranasal sinus disease with nonspecific fluid levels which may represent acute sinusitis. These results were called by telephone at the time of interpretation on 07/09/2016 at 8:27 pm to Dr. Francine Graven , who verbally acknowledged these results. Electronically Signed   By: Kristine Garbe M.D.   On: 07/09/2016 20:27   Mr Jodene Nam Head (cerebral Arteries) Result Date: 07/09/2016 CLINICAL DATA:  55 y/o F; syncopal episode today. Question of hemorrhage in right basal ganglia. EXAM: MRI HEAD WITHOUT CONTRAST MRA HEAD WITHOUT CONTRAST TECHNIQUE: Axial and coronal DWI sequences of the brain was obtained without intravenous contrast. Angiographic images of the head were obtained using MRA technique without contrast. COMPARISON:  07/09/2016 CT of the head. 04/10/2013 MRI of the brain. FINDINGS: MRI HEAD FINDINGS Brain: No diffusion restriction to suggest acute infarct. No abnormal susceptibility hypointensity to indicate intracranial hemorrhage. No significant T2 FLAIR signal abnormality. No focal mass effect. Question hemorrhage within basal ganglia on CT has signal characteristics consistent with globus pallidus mineralization, benign finding. Extra-axial space: No hydrocephalus. No extra-axial collection is identified. Proximal intracranial flow voids are maintained. Other: Moderate diffuse paranasal sinus mucosal thickening and small fluid levels in anterior ethmoid air cells. No abnormal signal of the mastoid air cells. Orbits are unremarkable. Calvarium is unremarkable. MRA HEAD FINDINGS Internal carotid arteries:  Patent. Anterior cerebral arteries:  Patent. Middle cerebral arteries: Patent. Anterior communicating artery: Patent. Posterior communicating arteries: Large left identified. No right  identified, hypoplastic or absent. Posterior cerebral arteries:  Patent. Basilar artery:  Patent. Vertebral arteries:  Patent. No occlusion, aneurysm, go or significant stenosis is identified. IMPRESSION: 1. No acute intracranial abnormality is identified. Questioned hemorrhage within basal ganglia on CT has signal characteristics consistent with globus pallidus mineralization, benign finding. 2. Patent circle of Willis. No occlusion, aneurysm, or significant stenosis is identified. 3. Moderate paranasal sinus disease with nonspecific fluid levels which may represent acute sinusitis. These results were called by telephone at the time of interpretation on 07/09/2016 at 8:27 pm to Dr. Francine Graven , who verbally acknowledged these results. Electronically Signed   By: Kristine Garbe M.D.   On: 07/09/2016 20:27    Results for [name], [name] (MRN [id number])  as of 07/09/2016 21:22  Ref. Range 10/24/2013 10:38 10/17/2014 13:32 07/02/2016 11:49 07/09/2016 16:30  BUN Latest Ref Range: 6 - 20 mg/dL 18 20 14 22  (H)  Creatinine Latest Ref Range: 0.44 - 1.00 mg/dL 0.69 0.96 0.78 1.51 (H)    1800:  T/C to [facility] Neuro Dr. Shon Hale, case discussed, including:  HPI, pertinent PM/SHx, VS/PE, dx testing, ED course and treatment:  He has viewed the CT images, requests to obtain MRI/A brain to further clarify.   2100:  IVF x3L NS given with slow improvement in BP. Pt unable to stand for orthostatic VS after IVF. +UTI, UC pending; IV rocephin given. MRI-H reassuring. Potassium repleted PO. Dx and testing d/w pt and family.  Questions answered.  Verb understanding, agreeable to admit. T/C to [facility] Dr. Darrick Meigs, case discussed, including:  HPI, pertinent PM/SHx, VS/PE, dx testing, ED course and treatment:  Agreeable to admit, requests to write temporary orders, obtain stepdown bed to team APAdmits.       Final Clinical Impressions(s) / ED Diagnoses   Final diagnoses:  None    New Prescriptions    Francine Graven, DO 07/11/16 2010

## 2016-07-09 NOTE — ED Notes (Signed)
MD Thurnell Garbe notified of pt's BP of 84/56 while in trendelenburg position.

## 2016-07-09 NOTE — H&P (Addendum)
TRH H&P   Patient Demographics:    [name], is a 55 y.o. female  MRN: [id number]  DOB - Sep 19, 1961  Admit Date - 07/09/2016  Outpatient Primary MD for the patient is Kenn File, MD  Referring MD/NP/PA: Dr Thurnell Garbe  Patient coming from: Home  Chief Complaint  Patient presents with  . Loss of Consciousness      HPI:    [name]  is a 55 y.o. female, *History of hypertension, hyperlipidemia, anxiety who was brought to the hospital after patient had episode of passing out at home. As per patient she went out with mother shopping, and then she came home she was very dizzy. Patient said that she laid down in bed for a few minutes, and when she got up from lying position and walked, she passed out. Her sister witnessed the episode. There was no jerky movement of limbs noted, no seizure-like activity, she did not bite down on tongue  Or had bladder incontinence. Patient will out  for 10- 15 minutes as per sister. She denies chest pain or shortness of breath. No nausea vomiting or diarrhea.  In the ED, when patient initially presented her blood pressure was 79 / 52, which improved to 112 / 77 after she received 3 L of IV fluid. Lab work showed acute kidney injury with creatinine 1.51, hypokalemia potassium 3.0. Patient empirically started on ceftriaxone for possible UTI as he was mildly abnormal. Patient had imaging studies including CT head, MRI/MRA head, tele neurology consultation. All the imaging studies are negative for acute abnormality. CT head showed possible hemorrhage, MRI brain confirmed no hemorrhage.       Review of systems:    In addition to the HPI above No Fever-chills, No Headache, No changes with Vision or hearing, No problems swallowing food or Liquids, No Chest pain, Cough or Shortness of Breath, No Abdominal pain, No Nausea or Vomiting, bowel movements are  regular, No Blood in stool or Urine, No dysuria, + Frequency of urination  No new skin rashes or bruises, No new joints pains-aches,  No new weakness, tingling, numbness in any extremity, No recent weight gain or loss, No polyuria, polydypsia or polyphagia, No significant Mental Stressors.  A full 10 point Review of Systems was done, except as stated above, all other Review of Systems were negative.   With Past History of the following :    Past Medical History:  Diagnosis Date  . Abdominal pain   . Anxiety   . Arthritis   . Blood in stool   . Breast changes, fibrocystic   . Bruises easily   . Clotting disorder (HCC)     itp , none since 1976, no current hematologist  . Constipation   . DDD (degenerative disc disease), cervical    with lumbar issues  . Depression   . Generalized headaches   . GERD (gastroesophageal reflux disease)   . Hemorrhoid   . Hyperlipidemia   . Hypertension   . Idiopathic thrombocytopenia ([place])  as child  . Leg swelling    both ankles  . Liver lesion   . Nasal congestion   . Nausea & vomiting   . Neuromuscular disorder ([place])    back  injury  . Neuromuscular disorder ([place])    3 neck surgeries,neck fusion.pin,plates,screws  . Osteoporosis   . Panic attacks    pt also  has germaphobia  . Rectal bleeding   . Seizures ([place]) 1998   stress induced, one time, none since, no seizure meds  . Seizures ([place])    Stated she had 2 weeks ago,it was like a transient,stare.  . Sleep apnea    STOPBANG=4  . Spleen enlarged 1976, none since   r/t idiopathic thrombocytopenia  . Trouble swallowing   . Unintentional weight loss   . Weakness    weakness varies      Past Surgical History:  Procedure Laterality Date  . ABDOMINAL HYSTERECTOMY  1997  . CERVICAL SPINE SURGERY  2005, 2007, 2010   x3, fusion done,plates and screws present  . [place]  . CHOLECYSTECTOMY  03/14/2012   Procedure: LAPAROSCOPIC CHOLECYSTECTOMY;  Surgeon:  Stark Klein, MD;  Location: [facility];  Service: General;  Laterality: N/A;  . colonscopy and esophagogastrodudononescopy  02-04-12  . [place] SURGERY  11/2014      Social History:     Social History  Substance Use Topics  . Smoking status: Current Every Day Smoker    Packs/day: 1.00    Years: 5.00    Types: Cigarettes    Start date: 12/20/2009  . Smokeless tobacco: Never Used     Comment: VAPOR- OCCASIONALLY  . Alcohol use No        Family History :     Family History  Problem Relation Age of Onset  . Hypertension Mother   . Heart disease Mother   . Lung cancer Father   . Cancer Father     lung  . Colon cancer Paternal Grandmother   . Cancer Paternal Grandmother     colon  . Heart disease Maternal Grandmother   . Diabetes Sister       Home Medications:   Prior to Admission medications   Medication Sig Start Date End Date Taking? Authorizing Provider  ALPRAZolam Duanne Moron) 0.5 MG tablet Take 1 tablet (0.5 mg total) by mouth 3 (three) times daily as needed. 07/02/16  Yes Sharion Balloon, FNP  atenolol (TENORMIN) 50 MG tablet TAKE ONE TABLET BY MOUTH ONCE DAILY WITH BREAKFAST 07/02/16  Yes Sharion Balloon, FNP  citalopram (CELEXA) 40 MG tablet Take 1 tablet (40 mg total) by mouth daily with breakfast. 07/02/16  Yes Sharion Balloon, FNP  gabapentin (NEURONTIN) 300 MG capsule Take 600 mg by mouth 3 (three) times daily.  09/28/13  Yes Historical Provider, MD  gemfibrozil (LOPID) 600 MG tablet Take 1 tablet (600 mg total) by mouth 2 (two) times daily before a meal. 07/09/16  Yes Sharion Balloon, FNP  HYDROcodone-acetaminophen (NORCO) 10-325 MG per tablet Take 1-2 tablets by mouth every 6 (six) hours as needed for pain. For pain 03/17/12  Yes Stark Klein, MD  lisinopril-hydrochlorothiazide (PRINZIDE,ZESTORETIC) 20-25 MG tablet Take 1 tablet by mouth daily. 07/02/16  Yes Sharion Balloon, FNP  pantoprazole (PROTONIX) 40 MG tablet Take 1 tablet (40 mg total) by mouth 2 (two) times  daily. 11/06/15  Yes Milus Banister, MD  traZODone (DESYREL) 100 MG tablet Take 1 tablet (100 mg total) by mouth  at bedtime. 07/02/16  Yes Sharion Balloon, FNP  Vitamin D, Ergocalciferol, (DRISDOL) 50000 units CAPS capsule Take 1 capsule (50,000 Units total) by mouth every 7 (seven) days. 07/03/16  Yes Sharion Balloon, FNP  VOLTAREN 1 % GEL Apply 2 g topically 4 (four) times daily as needed.  07/24/14  Yes Historical Provider, MD  metoCLOPramide (REGLAN) 10 MG tablet Take 1 tablet (10 mg total) by mouth at bedtime. 11/19/15   Milus Banister, MD  ondansetron (ZOFRAN) 4 MG tablet Take 1 tablet by mouth daily as needed. 04/02/16   Historical Provider, MD     Allergies:     Allergies  Allergen Reactions  . Nsaids Nausea Only    Abdominal pain     Physical Exam:   Vitals  Blood pressure 112/77, pulse 64, temperature 98.7 F (37.1 C), temperature source Oral, resp. rate 15, height 5\' 4"  (1.626 m), weight 84.4 kg (186 lb), SpO2 91 %.   1. General Morbidly obese female  lying in bed in NAD, cooperative* with exam  2. Normal affect and insight, Awake Alert, Oriented X 3.  3. No F.N deficits, ALL C.Nerves Intact, Strength 5/5 all 4 extremities, Sensation intact all 4 extremities, Plantars down going.  4. Ears and Eyes appear Normal, Conjunctivae clear, PERRLA. Moist Oral Mucosa.  5. Supple Neck, No JVD, No cervical lymphadenopathy appriciated, No Carotid Bruits.  6. Symmetrical Chest wall movement, Good air movement bilaterally, CTAB.  7. RRR, No Gallops, Rubs or Murmurs, No Parasternal Heave.No Leg edema  8. Positive Bowel Sounds, Abdomen Soft, mild suprapubic tenderness on palpation, No organomegaly appriciated,No rebound -guarding or rigidity.  9.  No Cyanosis, Normal Skin Turgor, No Skin Rash or Bruise.  10. Good muscle tone,  joints appear normal , no effusions, Normal ROM.      Data Review:    CBC  Recent Labs Lab 07/09/16 1630  WBC 14.8*  HGB 14.0  HCT 43.2  PLT  270  MCV 92.1  MCH 29.9  MCHC 32.4  RDW 13.2  LYMPHSABS 3.2  MONOABS 1.5*  EOSABS 0.3  BASOSABS 0.0   ------------------------------------------------------------------------------------------------------------------  Chemistries   Recent Labs Lab 07/09/16 1630  NA 135  K 3.0*  CL 101  CO2 27  GLUCOSE 108*  BUN 22*  CREATININE 1.51*  CALCIUM 7.9*  AST 24  ALT 23  ALKPHOS 77  BILITOT 0.5   ------------------------------------------------------------------------------------------------------------------  ------------------------------------------------------------------------------------------------------------------ GFR: Estimated Creatinine Clearance: 43.2 mL/min (by C-G formula based on SCr of 1.51 mg/dL). Liver Function Tests:  Recent Labs Lab 07/09/16 1630  AST 24  ALT 23  ALKPHOS 77  BILITOT 0.5  PROT 5.9*  ALBUMIN 3.2*   Cardiac Enzymes:  Recent Labs Lab 07/09/16 1630  TROPONINI <0.03    --------------------------------------------------------------------------------------------------------------- Urine analysis:    Component Value Date/Time   COLORURINE YELLOW 07/09/2016 [place] 07/09/2016 1644   APPEARANCEUR Clear 07/02/2016 1055   LABSPEC >1.030 (H) 07/09/2016 1644   PHURINE 5.5 07/09/2016 1644   GLUCOSEU NEGATIVE 07/09/2016 1644   HGBUR NEGATIVE 07/09/2016 1644   BILIRUBINUR SMALL (A) 07/09/2016 1644   BILIRUBINUR Negative 07/02/2016 1055   KETONESUR TRACE (A) 07/09/2016 1644   PROTEINUR 30 (A) 07/09/2016 1644   UROBILINOGEN negative 03/21/2015 1636   UROBILINOGEN 0.2 03/16/2012 0946   NITRITE NEGATIVE 07/09/2016 1644   LEUKOCYTESUR NEGATIVE 07/09/2016 1644   LEUKOCYTESUR 0-5 07/02/2016 1055      ----------------------------------------------------------------------------------------------------------------   Imaging Results:    Dg Chest 1 View  Result Date: 07/09/2016 CLINICAL DATA:  Syncope, smoking his  EXAM: CHEST 1 VIEW COMPARISON:  Chest x-ray of 05/16/2015 FINDINGS: No active infiltrate or effusion is seen. Mediastinal and hilar contours are unremarkable. There is some prominence to the right paratracheal region probably vascular due to the semi upright position in which the film was taken. Attention to this area on followup chest x-ray is recommended. The heart is mildly enlarged. Hardware for both anterior and posterior fusion of the cervical spine is noted. IMPRESSION: 1. No active infiltrate or effusion. 2. Prominence of the right paratracheal soft tissues probably is vascular in origin. Recommend attention to this area on followup chest x-ray. Electronically Signed   By: Ivar Drape M.D.   On: 07/09/2016 17:05   Ct Head Wo Contrast  Result Date: 07/09/2016 CLINICAL DATA:  Syncopal episode today. Patient started a new blood pressure medication today. EXAM: CT HEAD WITHOUT CONTRAST TECHNIQUE: Contiguous axial images were obtained from the base of the skull through the vertex without intravenous contrast. COMPARISON:  None. FINDINGS: Brain: Focal increased density is identified in the right basal ganglia with Hounsfield unit of 62. There is no midline shift or hydrocephalus. No acute transcortical infarct is identified. No focal mass is identified. Vascular: No hyperdense vessels are noted. Skull: Intact. Sinuses/Orbits: Mucoperiosteal thickening of bilateral ethmoid, sphenoid sinuses are identified. Other: None. IMPRESSION: Small focal increased density is identified in the right basal ganglia with Hounsfield unit of 62, focal hemorrhage is not excluded. No mass effect is identified. These results will be called to the ordering clinician or representative by the Radiologist Assistant, and communication documented in the PACS or zVision Dashboard. Electronically Signed   By: Abelardo Diesel M.D.   On: 07/09/2016 17:46   Mr Brain Wo Contrast (neuro Protocol)  Result Date: 07/09/2016 CLINICAL DATA:  55  y/o F; syncopal episode today. Question of hemorrhage in right basal ganglia. EXAM: MRI HEAD WITHOUT CONTRAST MRA HEAD WITHOUT CONTRAST TECHNIQUE: Axial and coronal DWI sequences of the brain was obtained without intravenous contrast. Angiographic images of the head were obtained using MRA technique without contrast. COMPARISON:  07/09/2016 CT of the head. 04/10/2013 MRI of the brain. FINDINGS: MRI HEAD FINDINGS Brain: No diffusion restriction to suggest acute infarct. No abnormal susceptibility hypointensity to indicate intracranial hemorrhage. No significant T2 FLAIR signal abnormality. No focal mass effect. Question hemorrhage within basal ganglia on CT has signal characteristics consistent with globus pallidus mineralization, benign finding. Extra-axial space: No hydrocephalus. No extra-axial collection is identified. Proximal intracranial flow voids are maintained. Other: Moderate diffuse paranasal sinus mucosal thickening and small fluid levels in anterior ethmoid air cells. No abnormal signal of the mastoid air cells. Orbits are unremarkable. Calvarium is unremarkable. MRA HEAD FINDINGS Internal carotid arteries:  Patent. Anterior cerebral arteries:  Patent. Middle cerebral arteries: Patent. Anterior communicating artery: Patent. Posterior communicating arteries: Large left identified. No right identified, hypoplastic or absent. Posterior cerebral arteries:  Patent. Basilar artery:  Patent. Vertebral arteries:  Patent. No occlusion, aneurysm, go or significant stenosis is identified. IMPRESSION: 1. No acute intracranial abnormality is identified. Questioned hemorrhage within basal ganglia on CT has signal characteristics consistent with globus pallidus mineralization, benign finding. 2. Patent circle of Willis. No occlusion, aneurysm, or significant stenosis is identified. 3. Moderate paranasal sinus disease with nonspecific fluid levels which may represent acute sinusitis. These results were called by  telephone at the time of interpretation on 07/09/2016 at 8:27 pm to Dr. Francine Graven , who verbally acknowledged these results.  Electronically Signed   By: Kristine Garbe M.D.   On: 07/09/2016 20:27   Mr Jodene Nam Head (cerebral Arteries)  Result Date: 07/09/2016 CLINICAL DATA:  55 y/o F; syncopal episode today. Question of hemorrhage in right basal ganglia. EXAM: MRI HEAD WITHOUT CONTRAST MRA HEAD WITHOUT CONTRAST TECHNIQUE: Axial and coronal DWI sequences of the brain was obtained without intravenous contrast. Angiographic images of the head were obtained using MRA technique without contrast. COMPARISON:  07/09/2016 CT of the head. 04/10/2013 MRI of the brain. FINDINGS: MRI HEAD FINDINGS Brain: No diffusion restriction to suggest acute infarct. No abnormal susceptibility hypointensity to indicate intracranial hemorrhage. No significant T2 FLAIR signal abnormality. No focal mass effect. Question hemorrhage within basal ganglia on CT has signal characteristics consistent with globus pallidus mineralization, benign finding. Extra-axial space: No hydrocephalus. No extra-axial collection is identified. Proximal intracranial flow voids are maintained. Other: Moderate diffuse paranasal sinus mucosal thickening and small fluid levels in anterior ethmoid air cells. No abnormal signal of the mastoid air cells. Orbits are unremarkable. Calvarium is unremarkable. MRA HEAD FINDINGS Internal carotid arteries:  Patent. Anterior cerebral arteries:  Patent. Middle cerebral arteries: Patent. Anterior communicating artery: Patent. Posterior communicating arteries: Large left identified. No right identified, hypoplastic or absent. Posterior cerebral arteries:  Patent. Basilar artery:  Patent. Vertebral arteries:  Patent. No occlusion, aneurysm, go or significant stenosis is identified. IMPRESSION: 1. No acute intracranial abnormality is identified. Questioned hemorrhage within basal ganglia on CT has signal characteristics  consistent with globus pallidus mineralization, benign finding. 2. Patent circle of Willis. No occlusion, aneurysm, or significant stenosis is identified. 3. Moderate paranasal sinus disease with nonspecific fluid levels which may represent acute sinusitis. These results were called by telephone at the time of interpretation on 07/09/2016 at 8:27 pm to Dr. Francine Graven , who verbally acknowledged these results. Electronically Signed   By: Kristine Garbe M.D.   On: 07/09/2016 20:27    My personal review of EKG: Rhythm NSR, nonspecific ST abnormality    Assessment & Plan:    Active Problems:   Essential hypertension   Hypotension   Syncope   AKI (acute kidney injury) ([place])   Hypokalemia   1. Syncope- orthostatic vital signs were checked in the ED which was negative. Patient's blood pressure has improved after receiving IV fluids. Will admit under observation, cardiac monitoring, cycle cardiac enzymes every 6 hours 3, and obtain echocardiogram in a.m. 2. Acute kidney injury- patient's creatinine 1.51, likely from dehydration and baseline creatinine 0.78 as of 07/02/2016. Hold HCTZ/lisinopril. Check BMP in a.m. 3. Hypokalemia- replace potassium and check BMP in a.m. 4. History of hypertension-  patient hypotensive, will hold antihypertensive medications including lisinopril/HCTZ, atenolol. 5. ? UTI- UA showed many bacteria, negative nitrite, started empirically on Ceftriaxone. Follow urine culture.   DVT Prophylaxis-   Lovenox   AM Labs Ordered, also please review Full Orders  Family Communication: Admission, patients condition and plan of care including tests being ordered have been discussed with the patient and her sister at bedside  who indicate understanding and agree with the plan and Code Status.  Code Status:  Full code  Admission status: Observation    Time spent in minutes : 60 minutes   Ketra Duchesne S M.D on 07/09/2016 at 9:42 PM  Between 7am to 7pm - Pager -  [phone]. After 7pm go to [email or web address] - password [facility]  [facility] - Office  [phone]

## 2016-07-09 NOTE — ED Notes (Signed)
Pt returned from MRI at this time

## 2016-07-09 NOTE — ED Notes (Signed)
Pt's BP is 97/60 after second bolus, MD notified. Stated to wait until MRI is done to perform orthostatic VS. MD also made aware pt is having neck and back pain at this time and is requesting medication.

## 2016-07-09 NOTE — Progress Notes (Signed)
Per pt request Lipitor stopped and lopid Prescription sent to pharmacy

## 2016-07-09 NOTE — Progress Notes (Signed)
Pharmacy Antibiotic Note  [name] is a 55 y.o. female admitted on 07/09/2016 with UTI.  Pharmacy has been consulted for rocephin dosing.  Plan: Cont rocephin 1 gm IV q24 hours F/u cultures and clinical course  Height: 5\' 4"  (162.6 cm) Weight: 195 lb 15.8 oz (88.9 kg) IBW/kg (Calculated) : 54.7  Temp (24hrs), Avg:97.9 F (36.6 C), Min:97.1 F (36.2 C), Max:98.7 F (37.1 C)   Recent Labs Lab 07/09/16 1630  WBC 14.8*  CREATININE 1.51*    Estimated Creatinine Clearance: 44.4 mL/min (by C-G formula based on SCr of 1.51 mg/dL).    Allergies  Allergen Reactions  . Nsaids Nausea Only    Abdominal pain    Antimicrobials this admission: rocephin 8/31 >>     Thank you for allowing pharmacy to be a part of this patient's care.  Excell Seltzer P[place]et 07/09/2016 10:51 PM

## 2016-07-10 ENCOUNTER — Observation Stay (HOSPITAL_BASED_OUTPATIENT_CLINIC_OR_DEPARTMENT_OTHER): Payer: Medicare HMO

## 2016-07-10 DIAGNOSIS — E876 Hypokalemia: Secondary | ICD-10-CM | POA: Diagnosis not present

## 2016-07-10 DIAGNOSIS — E860 Dehydration: Secondary | ICD-10-CM

## 2016-07-10 DIAGNOSIS — N179 Acute kidney failure, unspecified: Secondary | ICD-10-CM | POA: Diagnosis not present

## 2016-07-10 DIAGNOSIS — R55 Syncope and collapse: Secondary | ICD-10-CM | POA: Diagnosis not present

## 2016-07-10 DIAGNOSIS — I10 Essential (primary) hypertension: Secondary | ICD-10-CM | POA: Diagnosis not present

## 2016-07-10 LAB — BASIC METABOLIC PANEL
ANION GAP: 3 — AB (ref 5–15)
BUN: 17 mg/dL (ref 6–20)
CALCIUM: 8.1 mg/dL — AB (ref 8.9–10.3)
CHLORIDE: 110 mmol/L (ref 101–111)
CO2: 28 mmol/L (ref 22–32)
Creatinine, Ser: 0.8 mg/dL (ref 0.44–1.00)
GFR calc non Af Amer: >60 mL/min (ref >60)
GLUCOSE: 96 mg/dL (ref 65–99)
POTASSIUM: 4 mmol/L (ref 3.5–5.1)
Sodium: 141 mmol/L (ref 135–145)

## 2016-07-10 LAB — CBC
HEMATOCRIT: 38.2 % (ref 36.0–46.0)
Hemoglobin: 12.2 g/dL (ref 12.0–15.0)
MCH: 29.6 pg (ref 26.0–34.0)
MCHC: 31.9 g/dL (ref 30.0–36.0)
MCV: 92.7 fL (ref 78.0–100.0)
PLATELETS: 230 10*3/uL (ref 150–400)
RBC: 4.12 MIL/uL (ref 3.87–5.11)
RDW: 13.4 % (ref 11.5–15.5)
WBC: 11.4 10*3/uL — AB (ref 4.0–10.5)

## 2016-07-10 LAB — TROPONIN I
Troponin I: <0.03 ng/mL (ref <0.03)
Troponin I: <0.03 ng/mL (ref <0.03)

## 2016-07-10 LAB — ECHOCARDIOGRAM COMPLETE
Height: 64 in
Weight: 3135.82 oz

## 2016-07-10 LAB — MRSA PCR SCREENING: MRSA by PCR: NEGATIVE

## 2016-07-10 MED ORDER — BACITRACIN ZINC 500 UNIT/GM EX OINT
TOPICAL_OINTMENT | Freq: Two times a day (BID) | CUTANEOUS | Status: DC
  Administered 2016-07-10: 1 via TOPICAL
  Filled 2016-07-10: qty 0.9

## 2016-07-10 MED ORDER — TRAZODONE HCL 50 MG PO TABS
100.0000 mg | ORAL_TABLET | Freq: Every day | ORAL | Status: DC
  Administered 2016-07-10: 100 mg via ORAL
  Filled 2016-07-10: qty 2

## 2016-07-10 MED ORDER — TRAZODONE HCL 50 MG PO TABS
50.0000 mg | ORAL_TABLET | Freq: Once | ORAL | Status: AC
  Administered 2016-07-10: 50 mg via ORAL
  Filled 2016-07-10: qty 1

## 2016-07-10 MED ORDER — PERFLUTREN LIPID MICROSPHERE
1.0000 mL | INTRAVENOUS | Status: AC | PRN
  Administered 2016-07-10: 2 mL via INTRAVENOUS
  Filled 2016-07-10: qty 10

## 2016-07-10 MED ORDER — ALPRAZOLAM 0.5 MG PO TABS
0.5000 mg | ORAL_TABLET | Freq: Three times a day (TID) | ORAL | Status: DC | PRN
  Administered 2016-07-10 – 2016-07-11 (×2): 0.5 mg via ORAL
  Filled 2016-07-10 (×2): qty 1

## 2016-07-10 NOTE — Evaluation (Signed)
Physical Therapy Evaluation Patient Details Name: [name] MRN: [id number] DOB: 1961/01/24 Today's Date: 07/10/2016   History of Present Illness  55 y.o. female, *History of hypertension, hyperlipidemia, anxiety who was brought to the hospital after patient had episode of passing out at home. As per patient she went out with mother shopping, and then she came home she was very dizzy. Patient said that she laid down in bed for a few minutes, and when she got up from lying position and walked, she passed out. Her sister witnessed the episode.  Pt was hypotensive in the ED.  CT head showed possible hemorrhage, MRI brain confirmed no hemorrhage.  ? UTI- UA showed many bacteria, negative nitrite, started empirically on Ceftriaxone. PMH: anxiety, arthritis, blood in stool, fibrocystic breast, clotting disorder - ITP, DDD - cervical and lumbar, depression, HTN, LE swelling, liver lesion, back injury, panic attacks, and germaphobia, seizures - stress induced, sleep apnea, enlarged spleen, c-spine surgery in 2005, 2007, and 2010, lumbar disc surgery 2016.   Clinical Impression  Pt received in bed with husband present, and pt was agreeable to PT evaluation.  Pt expressed that she is normally independent with ambulation and all ADL's.  Nursing staff informed PT, that just prior to arrival, they had pt ambulating in the hallway when she experienced a near syncope event where they had to pull a chair behind her because she became dizzy.  I attempted to obtain orthostatic vitals, however pt declined standing due to fear of syncope again.  Supine and seated vitals were as follows:   Supine BP: 117/82, HR: ranged from 59-70bpm quickly with HR monitor on telemetry not reading well. Sitting BP: 136/95, HR: 66bpm  While taking seated BP pt had episodes where she was less responsive, and seemed to almost fall asleep and was startled when I called her name.  Pt was able to perform a SPT from the bed<>BSC to urinate  with Min guard assistance due to history of syncope.  At this point d/c disposition is still TBD due to pt's limited mobility with me today.  She will likely not need any f/u PT.  Will continue to assess while in acute care setting to decrease risk factors for falling.      Follow Up Recommendations Other (comment);Supervision/Assistance - 24 hour (TBD)    Equipment Recommendations  None recommended by PT    Recommendations for Other Services       Precautions / Restrictions Precautions Precautions: Fall Precaution Comments: sudden syncope when ambulating with nursing staff earlier.  Restrictions Weight Bearing Restrictions: No      Mobility  Bed Mobility Overal bed mobility: Modified Independent             General bed mobility comments: HOB raised  Transfers Overall transfer level: Needs assistance Equipment used: None Transfers: Stand Pivot Transfers   Stand pivot transfers: Min guard       General transfer comment: Pt expressed need to use BSC.  Therefore, assisted to use BSC to urinate.   Ambulation/Gait Ambulation/Gait assistance:  (NT - pt declined due to fear of passing out.  nursing staff just had pt walking in the hallway when she had an episode of near syncope.  and had to have a chair pulled up behind her. )              Stairs            Wheelchair Mobility    Modified Rankin (Stroke Patients Only)  Balance Overall balance assessment: Needs assistance   Sitting balance-Leahy Scale: Normal     Standing balance support: No upper extremity supported Standing balance-Leahy Scale: Fair                               Pertinent Vitals/Pain Pain Assessment: 0-10 Pain Score: 9  Pain Location: back, neck, and L UE. Pain Descriptors / Indicators: Aching Pain Intervention(s): Limited activity within patient's tolerance;Monitored during session;RN gave pain meds during session    Home Living   Living Arrangements:  Spouse/significant other     Home Access: Stairs to enter Entrance Stairs-Rails: Can reach both Entrance Stairs-Number of Steps: 4 Home Layout: One level Home Equipment: Walker - 2 wheels;Cane - single point;Bedside commode;Shower seat      Prior Function Level of Independence: Independent               Hand Dominance        Extremity/Trunk Assessment   Upper Extremity Assessment: Overall WFL for tasks assessed           Lower Extremity Assessment: Overall WFL for tasks assessed         Communication   Communication: No difficulties  Cognition Arousal/Alertness: Awake/alert Behavior During Therapy: WFL for tasks assessed/performed Overall Cognitive Status: Within Functional Limits for tasks assessed                      General Comments      Exercises        Assessment/Plan    PT Assessment Patient needs continued PT services  PT Diagnosis Difficulty walking   PT Problem List Decreased balance;Cardiopulmonary status limiting activity  PT Treatment Interventions Functional mobility training;Balance training;Patient/family education;Therapeutic exercise;Gait training   PT Goals (Current goals can be found in the Care Plan section) Acute Rehab PT Goals Patient Stated Goal: Pt wants to go home PT Goal Formulation: With patient Time For Goal Achievement: 07/17/16 Potential to Achieve Goals: Good    Frequency Min 1X/week   Barriers to discharge        Co-evaluation               End of Session   Activity Tolerance: Patient tolerated treatment well Patient left: in bed;with call bell/phone within reach Nurse Communication: Mobility status    Functional Assessment Tool Used: [employer] "6-clicks"  Functional Limitation: Mobility: Walking and moving around Mobility: Walking and Moving Around Current Status [phone]): At least 20 percent but less than 40 percent impaired, limited or restricted Mobility: Walking and  Moving Around Goal Status [phone]): At least 1 percent but less than 20 percent impaired, limited or restricted    Time: 1515-1540 PT Time Calculation (min) (ACUTE ONLY): 25 min   Charges:   PT Evaluation $PT Eval Low Complexity: 1 Procedure PT Treatments $Therapeutic Activity: 8-22 mins   PT G Codes:   PT G-Codes **NOT FOR INPATIENT CLASS** Functional Assessment Tool Used: [employer] AM-PAC "6-clicks"  Functional Limitation: Mobility: Walking and moving around Mobility: Walking and Moving Around Current Status [phone]): At least 20 percent but less than 40 percent impaired, limited or restricted Mobility: Walking and Moving Around Goal Status [phone]): At least 1 percent but less than 20 percent impaired, limited or restricted    Beth Marsia Cino, PT, DPT X: [phone]

## 2016-07-10 NOTE — Care Management Obs Status (Signed)
[facility] NOTIFICATION   Patient Details  Name: [name] MRN: [id number] Date of Birth: 1961/10/20   Medicare Observation Status Notification Given:  Yes    Sherald Barge, RN 07/10/2016, 2:17 PM

## 2016-07-10 NOTE — Progress Notes (Signed)
PROGRESS NOTE    [name]  [id number] DOB: 11-10-1960 DOA: 07/09/2016 PCP: Kenn File, MD    Brief Narrative:  55 year old female with history of hypertension, hyperlipidemia, anxiety who presented to the hospital following an episode of syncope at home. Patient was out shopping with her mother when she became dizzy and lay down for several minutes. Upon sitting up from a lying position, patient reportedly syncopized. Patient was also present emergency department where she was found to be hypotensive with a blood pressure is low as 79/52, improved with IV fluids. Patient was admitted for further workup.  Assessment & Plan:   Principal Problem:   Syncope Active Problems:   Essential hypertension   Hypotension   AKI (acute kidney injury) (HCC)   Hypokalemia   Dehydration   1. Syncope 1. Suspect related to dehydration given acute renal failure and hypotension responsive to IV fluids 2. Stable thus far without recurrence 3. 2-D echocardiogram done on 07/10/2016. Results are pending 4. Continue to monitor in telemetry, currently stable 2. Hypertension/hypotension 1. Presented hypotensive, improved with IV fluids 2. Blood pressure currently remains soft 3. Home blood pressure medications on hold 4. Suspect patient would benefit from continuing to hold blood pressure medications, to be resumed later as tolerated 3. Acute renal failure 1. Suspect secondary to dehydration although patient claims to be eating and drinking appropriately 2. Resolved with IV fluids 4. Hypokalemia 1. Resolved, corrected 5. Dehydration 1. Improved with IV fluids 6. Anxiety 1. On when necessary benzos. We'll continue  DVT prophylaxis: Lovenox subQ Code Status: Full Family Communication: Pt in room, family at bedside Disposition Plan: possible d/c within 24hrs  Consultants:     Procedures:     Antimicrobials: Anti-infectives    Start     Dose/Rate Route Frequency Ordered Stop     07/10/16 2100  cefTRIAXone (ROCEPHIN) 1 g in dextrose 5 % 50 mL IVPB  Status:  Discontinued     1 g 100 mL/hr over 30 Minutes Intravenous Every 24 hours 07/09/16 2250 07/10/16 0854   07/09/16 2030  cefTRIAXone (ROCEPHIN) 1 g in dextrose 5 % 50 mL IVPB     1 g 100 mL/hr over 30 Minutes Intravenous  Once 07/09/16 2025 07/09/16 2115      Subjective: Feels better, still feels somewhat weak.  Objective: Vitals:   07/10/16 1315 07/10/16 1400 07/10/16 1430 07/10/16 1505  BP: 99/68 114/72 105/77 (!) 143/85  Pulse: 67 66 68 71  Resp: 13 18  (!) 25  Temp:      TempSrc:      SpO2: 94% 94% 96% 98%  Weight:      Height:        Intake/Output Summary (Last 24 hours) at 07/10/16 1551 Last data filed at 07/10/16 1303  Gross per 24 hour  Intake          1939.58 ml  Output              875 ml  Net          1064.58 ml   Filed Weights   07/09/16 1557 07/09/16 2249 07/10/16 0500  Weight: 84.4 kg (186 lb) 88.9 kg (195 lb 15.8 oz) 88.9 kg (195 lb 15.8 oz)    Examination:  General exam: Appears calm and comfortable, laying in bed  Respiratory system: Clear to auscultation. Respiratory effort normal. Cardiovascular system: S1 & S2 heard, RRR. No JVD, murmurs, rubs, gallops or clicks. No pedal edema. Gastrointestinal system: Abdomen is nondistended, soft  and nontender. No organomegaly or masses felt. Normal bowel sounds heard. Central nervous system: Alert and oriented. No focal neurological deficits. Extremities: Symmetric 5 x 5 power. Skin: No lesions, normal skin turgor Psychiatry: Judgement and insight appear normal. Mood & affect appropriate.   Data Reviewed: I have personally reviewed following labs and imaging studies  CBC:  Recent Labs Lab 07/09/16 1630 07/10/16 0442  WBC 14.8* 11.4*  NEUTROABS 9.8*  --   HGB 14.0 12.2  HCT 43.2 38.2  MCV 92.1 92.7  PLT 270 [id number]   Basic Metabolic Panel:  Recent Labs Lab 07/09/16 1630 07/10/16 0808  NA 135 141  K 3.0* 4.0  CL 101  110  CO2 27 28  GLUCOSE 108* 96  BUN 22* 17  CREATININE 1.51* 0.80  CALCIUM 7.9* 8.1*   GFR: Estimated Creatinine Clearance: 83.8 mL/min (by C-G formula based on SCr of 0.8 mg/dL). Liver Function Tests:  Recent Labs Lab 07/09/16 1630  AST 24  ALT 23  ALKPHOS 77  BILITOT 0.5  PROT 5.9*  ALBUMIN 3.2*   No results for input(s): LIPASE, AMYLASE in the last 168 hours. No results for input(s): AMMONIA in the last 168 hours. Coagulation Profile: No results for input(s): INR, PROTIME in the last 168 hours. Cardiac Enzymes:  Recent Labs Lab 07/09/16 1630 07/09/16 2301 07/10/16 0442 07/10/16 1108  TROPONINI <0.03 <0.03 <0.03 <0.03   BNP (last 3 results) No results for input(s): PROBNP in the last 8760 hours. HbA1C: No results for input(s): HGBA1C in the last 72 hours. CBG: No results for input(s): GLUCAP in the last 168 hours. Lipid Profile: No results for input(s): CHOL, HDL, LDLCALC, TRIG, CHOLHDL, LDLDIRECT in the last 72 hours. Thyroid Function Tests:  Recent Labs  07/09/16 1630  TSH 1.136   Anemia Panel: No results for input(s): VITAMINB12, FOLATE, FERRITIN, TIBC, IRON, RETICCTPCT in the last 72 hours. Sepsis Labs: No results for input(s): PROCALCITON, LATICACIDVEN in the last 168 hours.  Recent Results (from the past 240 hour(s))  Microscopic Examination     Status: None   Collection Time: 07/02/16 10:55 AM  Result Value Ref Range Status   WBC, UA 0-5 0 - 5 /hpf Final   RBC, UA 0-2 0 - 2 /hpf Final   Epithelial Cells (non renal) 0-10 0 - 10 /hpf Final   Bacteria, UA Few None seen/Few Final  MRSA PCR Screening     Status: None   Collection Time: 07/09/16 10:45 PM  Result Value Ref Range Status   MRSA by PCR NEGATIVE NEGATIVE Final    Comment:        The GeneXpert MRSA Assay (FDA approved for NASAL specimens only), is one component of a comprehensive MRSA colonization surveillance program. It is not intended to diagnose MRSA infection nor to guide  or monitor treatment for MRSA infections.      Radiology Studies: Dg Chest 1 View  Result Date: 07/09/2016 CLINICAL DATA:  Syncope, smoking his EXAM: CHEST 1 VIEW COMPARISON:  Chest x-ray of 05/16/2015 FINDINGS: No active infiltrate or effusion is seen. Mediastinal and hilar contours are unremarkable. There is some prominence to the right paratracheal region probably vascular due to the semi upright position in which the film was taken. Attention to this area on followup chest x-ray is recommended. The heart is mildly enlarged. Hardware for both anterior and posterior fusion of the cervical spine is noted. IMPRESSION: 1. No active infiltrate or effusion. 2. Prominence of the right paratracheal soft tissues probably is  vascular in origin. Recommend attention to this area on followup chest x-ray. Electronically Signed   By: Ivar Drape M.D.   On: 07/09/2016 17:05   Ct Head Wo Contrast  Result Date: 07/09/2016 CLINICAL DATA:  Syncopal episode today. Patient started a new blood pressure medication today. EXAM: CT HEAD WITHOUT CONTRAST TECHNIQUE: Contiguous axial images were obtained from the base of the skull through the vertex without intravenous contrast. COMPARISON:  None. FINDINGS: Brain: Focal increased density is identified in the right basal ganglia with Hounsfield unit of 62. There is no midline shift or hydrocephalus. No acute transcortical infarct is identified. No focal mass is identified. Vascular: No hyperdense vessels are noted. Skull: Intact. Sinuses/Orbits: Mucoperiosteal thickening of bilateral ethmoid, sphenoid sinuses are identified. Other: None. IMPRESSION: Small focal increased density is identified in the right basal ganglia with Hounsfield unit of 62, focal hemorrhage is not excluded. No mass effect is identified. These results will be called to the ordering clinician or representative by the Radiologist Assistant, and communication documented in the PACS or zVision Dashboard.  Electronically Signed   By: Abelardo Diesel M.D.   On: 07/09/2016 17:46   Mr Brain Wo Contrast (neuro Protocol)  Result Date: 07/09/2016 CLINICAL DATA:  55 y/o F; syncopal episode today. Question of hemorrhage in right basal ganglia. EXAM: MRI HEAD WITHOUT CONTRAST MRA HEAD WITHOUT CONTRAST TECHNIQUE: Axial and coronal DWI sequences of the brain was obtained without intravenous contrast. Angiographic images of the head were obtained using MRA technique without contrast. COMPARISON:  07/09/2016 CT of the head. 04/10/2013 MRI of the brain. FINDINGS: MRI HEAD FINDINGS Brain: No diffusion restriction to suggest acute infarct. No abnormal susceptibility hypointensity to indicate intracranial hemorrhage. No significant T2 FLAIR signal abnormality. No focal mass effect. Question hemorrhage within basal ganglia on CT has signal characteristics consistent with globus pallidus mineralization, benign finding. Extra-axial space: No hydrocephalus. No extra-axial collection is identified. Proximal intracranial flow voids are maintained. Other: Moderate diffuse paranasal sinus mucosal thickening and small fluid levels in anterior ethmoid air cells. No abnormal signal of the mastoid air cells. Orbits are unremarkable. Calvarium is unremarkable. MRA HEAD FINDINGS Internal carotid arteries:  Patent. Anterior cerebral arteries:  Patent. Middle cerebral arteries: Patent. Anterior communicating artery: Patent. Posterior communicating arteries: Large left identified. No right identified, hypoplastic or absent. Posterior cerebral arteries:  Patent. Basilar artery:  Patent. Vertebral arteries:  Patent. No occlusion, aneurysm, go or significant stenosis is identified. IMPRESSION: 1. No acute intracranial abnormality is identified. Questioned hemorrhage within basal ganglia on CT has signal characteristics consistent with globus pallidus mineralization, benign finding. 2. Patent circle of Willis. No occlusion, aneurysm, or significant  stenosis is identified. 3. Moderate paranasal sinus disease with nonspecific fluid levels which may represent acute sinusitis. These results were called by telephone at the time of interpretation on 07/09/2016 at 8:27 pm to Dr. Francine Graven , who verbally acknowledged these results. Electronically Signed   By: Kristine Garbe M.D.   On: 07/09/2016 20:27   Mr Jodene Nam Head (cerebral Arteries)  Result Date: 07/09/2016 CLINICAL DATA:  55 y/o F; syncopal episode today. Question of hemorrhage in right basal ganglia. EXAM: MRI HEAD WITHOUT CONTRAST MRA HEAD WITHOUT CONTRAST TECHNIQUE: Axial and coronal DWI sequences of the brain was obtained without intravenous contrast. Angiographic images of the head were obtained using MRA technique without contrast. COMPARISON:  07/09/2016 CT of the head. 04/10/2013 MRI of the brain. FINDINGS: MRI HEAD FINDINGS Brain: No diffusion restriction to suggest acute infarct. No abnormal  susceptibility hypointensity to indicate intracranial hemorrhage. No significant T2 FLAIR signal abnormality. No focal mass effect. Question hemorrhage within basal ganglia on CT has signal characteristics consistent with globus pallidus mineralization, benign finding. Extra-axial space: No hydrocephalus. No extra-axial collection is identified. Proximal intracranial flow voids are maintained. Other: Moderate diffuse paranasal sinus mucosal thickening and small fluid levels in anterior ethmoid air cells. No abnormal signal of the mastoid air cells. Orbits are unremarkable. Calvarium is unremarkable. MRA HEAD FINDINGS Internal carotid arteries:  Patent. Anterior cerebral arteries:  Patent. Middle cerebral arteries: Patent. Anterior communicating artery: Patent. Posterior communicating arteries: Large left identified. No right identified, hypoplastic or absent. Posterior cerebral arteries:  Patent. Basilar artery:  Patent. Vertebral arteries:  Patent. No occlusion, aneurysm, go or significant  stenosis is identified. IMPRESSION: 1. No acute intracranial abnormality is identified. Questioned hemorrhage within basal ganglia on CT has signal characteristics consistent with globus pallidus mineralization, benign finding. 2. Patent circle of Willis. No occlusion, aneurysm, or significant stenosis is identified. 3. Moderate paranasal sinus disease with nonspecific fluid levels which may represent acute sinusitis. These results were called by telephone at the time of interpretation on 07/09/2016 at 8:27 pm to Dr. Francine Graven , who verbally acknowledged these results. Electronically Signed   By: Kristine Garbe M.D.   On: 07/09/2016 20:27    Scheduled Meds: . citalopram  40 mg Oral Q breakfast  . enoxaparin (LOVENOX) injection  40 mg Subcutaneous Q24H  . gabapentin  600 mg Oral TID  . pantoprazole  40 mg Oral BID   Continuous Infusions: . sodium chloride 75 mL/hr at 07/10/16 1005     LOS: 0 days   Macaria Bias, Orpah Melter, MD [facility] Pager [phone]  If 7PM-7AM, please contact night-coverage [email or web address] Password [email or web address] 07/10/2016, 3:51 PM

## 2016-07-10 NOTE — Progress Notes (Signed)
*  PRELIMINARY RESULTS* Echocardiogram 2D Echocardiogram with definity has been performed.  [name] 07/10/2016, 3:16 PM

## 2016-07-10 NOTE — Care Management Note (Signed)
Case Management Note  Patient Details  Name: [name] MRN: [id number] Date of Birth: 1961/01/07  Subjective/Objective:                  Pt admitted with hypotension. She is from home, lives with her husband and is ind with ADL's. She has a PCP, transportation and no difficulty affording her medications. She plans to return home with self care. She has no DME or HH needs at this time.   Action/Plan: No CM needs anticipated.     Expected Discharge Date:     07/11/2016            Expected Discharge Plan:  Home/Self Care  In-House Referral:  NA  Discharge planning Services  CM Consult  Post Acute Care Choice:  NA Choice offered to:  NA  DME Arranged:    DME Agency:     HH Arranged:    HH Agency:     Status of Service:  Completed, signed off  If discussed at [employer] of Stay Meetings, dates discussed:    Additional Comments:  Sherald Barge, RN 07/10/2016, 2:18 PM

## 2016-07-11 DIAGNOSIS — N179 Acute kidney failure, unspecified: Secondary | ICD-10-CM | POA: Diagnosis not present

## 2016-07-11 DIAGNOSIS — E860 Dehydration: Secondary | ICD-10-CM | POA: Diagnosis not present

## 2016-07-11 DIAGNOSIS — I10 Essential (primary) hypertension: Secondary | ICD-10-CM | POA: Diagnosis not present

## 2016-07-11 LAB — URINE CULTURE

## 2016-07-11 MED ORDER — ATENOLOL 25 MG PO TABS: 25.0000 mg | ORAL_TABLET | Freq: Every day | ORAL | 0 refills | Status: DC

## 2016-07-11 NOTE — Discharge Summary (Signed)
Physician Discharge Summary  [name] [id number] DOB: [birth year] DOA: 07/09/2016  PCP: Kenn File, MD  Admit date: 07/09/2016 Discharge date: 07/11/2016  Admitted From: Home Disposition:  Home  Recommendations for Outpatient Follow-up:  1. Follow up with PCP in 1-2 weeks 2. Please monitor BP closely as patient's ACEI and diuretic on hold given presenting hypotension. Also, her atenolol dose was decreased from 50mg  to 25mg   Discharge Condition:Improved CODE STATUS:Full Diet recommendation: Regular   Brief/Interim Summary: 55 year old female with history of hypertension, hyperlipidemia, anxiety who presented to the hospital following an episode of syncope at home. Patient was out shopping with her mother when she became dizzy and lay down for several minutes. Upon sitting up from a lying position, patient reportedly syncopized. Patient was also present emergency department where she was found to be hypotensive with a blood pressure is low as 79/52, improved with IV fluids. Patient was admitted for further workup.  1. Syncope 1. Suspect related to dehydration given acute renal failure and hypotension responsive to IV fluids 2. Remained stable thus far without recurrence 3. 2-D echocardiogram done on 07/10/2016. Findings of EF 65-70% 2. Hypertension/hypotension 1. Presented hypotensive, improved with IV fluids 2. Blood pressure improved and normalized 3. Home blood pressure medications were on hold 4. Would resume lower dose of atenolol on discharge. Would ask PCP to resume bp meds as tolerated/needed as outpatient 3. Acute renal failure 1. Suspect secondary to dehydration although patient claims to be eating and drinking appropriately 2. Resolved with IV fluids 4. Hypokalemia 1. Resolved, corrected 5. Dehydration 1. Improved with IV fluids 6. Anxiety 1. On when necessary benzos.  Discharge Diagnoses:  Principal Problem:   Syncope Active Problems:   Essential  hypertension   Hypotension   AKI (acute kidney injury) ([place])   Hypokalemia   Dehydration    Discharge Instructions     Medication List    STOP taking these medications   lisinopril-hydrochlorothiazide 20-25 MG tablet Commonly known as:  PRINZIDE,ZESTORETIC     TAKE these medications   ALPRAZolam 0.5 MG tablet Commonly known as:  XANAX Take 1 tablet (0.5 mg total) by mouth 3 (three) times daily as needed.   atenolol 25 MG tablet Commonly known as:  TENORMIN Take 1 tablet (25 mg total) by mouth daily. What changed:  medication strength  how much to take  how to take this  when to take this  additional instructions   citalopram 40 MG tablet Commonly known as:  CELEXA Take 1 tablet (40 mg total) by mouth daily with breakfast.   gabapentin 300 MG capsule Commonly known as:  NEURONTIN Take 600 mg by mouth 3 (three) times daily.   gemfibrozil 600 MG tablet Commonly known as:  LOPID Take 1 tablet (600 mg total) by mouth 2 (two) times daily before a meal.   HYDROcodone-acetaminophen 10-325 MG tablet Commonly known as:  NORCO Take 1-2 tablets by mouth every 6 (six) hours as needed for pain. For pain   metoCLOPramide 10 MG tablet Commonly known as:  REGLAN Take 1 tablet (10 mg total) by mouth at bedtime.   ondansetron 4 MG tablet Commonly known as:  ZOFRAN Take 1 tablet by mouth daily as needed.   pantoprazole 40 MG tablet Commonly known as:  PROTONIX Take 1 tablet (40 mg total) by mouth 2 (two) times daily.   traZODone 100 MG tablet Commonly known as:  DESYREL Take 1 tablet (100 mg total) by mouth at bedtime.   Vitamin D (Ergocalciferol) 50000  units Caps capsule Commonly known as:  DRISDOL Take 1 capsule (50,000 Units total) by mouth every 7 (seven) days.   VOLTAREN 1 % Gel Generic drug:  diclofenac sodium Apply 2 g topically 4 (four) times daily as needed.      Follow-up Information    Kenn File, MD. Schedule an appointment as soon as  possible for a visit in 2 week(s).   Specialty:  Family Medicine Contact information: [address] [place] [address] [phone]          Allergies  Allergen Reactions  . Nsaids Nausea Only    Abdominal pain     Procedures/Studies: Dg Chest 1 View  Result Date: 07/09/2016 CLINICAL DATA:  Syncope, smoking his EXAM: CHEST 1 VIEW COMPARISON:  Chest x-ray of 05/16/2015 FINDINGS: No active infiltrate or effusion is seen. Mediastinal and hilar contours are unremarkable. There is some prominence to the right paratracheal region probably vascular due to the semi upright position in which the film was taken. Attention to this area on followup chest x-ray is recommended. The heart is mildly enlarged. Hardware for both anterior and posterior fusion of the cervical spine is noted. IMPRESSION: 1. No active infiltrate or effusion. 2. Prominence of the right paratracheal soft tissues probably is vascular in origin. Recommend attention to this area on followup chest x-ray. Electronically Signed   By: Ivar Drape M.D.   On: 07/09/2016 17:05   Ct Head Wo Contrast  Result Date: 07/09/2016 CLINICAL DATA:  Syncopal episode today. Patient started a new blood pressure medication today. EXAM: CT HEAD WITHOUT CONTRAST TECHNIQUE: Contiguous axial images were obtained from the base of the skull through the vertex without intravenous contrast. COMPARISON:  None. FINDINGS: Brain: Focal increased density is identified in the right basal ganglia with Hounsfield unit of 62. There is no midline shift or hydrocephalus. No acute transcortical infarct is identified. No focal mass is identified. Vascular: No hyperdense vessels are noted. Skull: Intact. Sinuses/Orbits: Mucoperiosteal thickening of bilateral ethmoid, sphenoid sinuses are identified. Other: None. IMPRESSION: Small focal increased density is identified in the right basal ganglia with Hounsfield unit of 62, focal hemorrhage is not excluded. No mass effect is  identified. These results will be called to the ordering clinician or representative by the Radiologist Assistant, and communication documented in the PACS or zVision Dashboard. Electronically Signed   By: Abelardo Diesel M.D.   On: 07/09/2016 17:46   Mr Brain Wo Contrast (neuro Protocol)  Result Date: 07/09/2016 CLINICAL DATA:  55 y/o F; syncopal episode today. Question of hemorrhage in right basal ganglia. EXAM: MRI HEAD WITHOUT CONTRAST MRA HEAD WITHOUT CONTRAST TECHNIQUE: Axial and coronal DWI sequences of the brain was obtained without intravenous contrast. Angiographic images of the head were obtained using MRA technique without contrast. COMPARISON:  07/09/2016 CT of the head. 04/10/2013 MRI of the brain. FINDINGS: MRI HEAD FINDINGS Brain: No diffusion restriction to suggest acute infarct. No abnormal susceptibility hypointensity to indicate intracranial hemorrhage. No significant T2 FLAIR signal abnormality. No focal mass effect. Question hemorrhage within basal ganglia on CT has signal characteristics consistent with globus pallidus mineralization, benign finding. Extra-axial space: No hydrocephalus. No extra-axial collection is identified. Proximal intracranial flow voids are maintained. Other: Moderate diffuse paranasal sinus mucosal thickening and small fluid levels in anterior ethmoid air cells. No abnormal signal of the mastoid air cells. Orbits are unremarkable. Calvarium is unremarkable. MRA HEAD FINDINGS Internal carotid arteries:  Patent. Anterior cerebral arteries:  Patent. Middle cerebral arteries: Patent. Anterior  communicating artery: Patent. Posterior communicating arteries: Large left identified. No right identified, hypoplastic or absent. Posterior cerebral arteries:  Patent. Basilar artery:  Patent. Vertebral arteries:  Patent. No occlusion, aneurysm, go or significant stenosis is identified. IMPRESSION: 1. No acute intracranial abnormality is identified. Questioned hemorrhage within  basal ganglia on CT has signal characteristics consistent with globus pallidus mineralization, benign finding. 2. Patent circle of Willis. No occlusion, aneurysm, or significant stenosis is identified. 3. Moderate paranasal sinus disease with nonspecific fluid levels which may represent acute sinusitis. These results were called by telephone at the time of interpretation on 07/09/2016 at 8:27 pm to Dr. Francine Graven , who verbally acknowledged these results. Electronically Signed   By: Kristine Garbe M.D.   On: 07/09/2016 20:27   Mr Jodene Nam Head (cerebral Arteries)  Result Date: 07/09/2016 CLINICAL DATA:  55 y/o F; syncopal episode today. Question of hemorrhage in right basal ganglia. EXAM: MRI HEAD WITHOUT CONTRAST MRA HEAD WITHOUT CONTRAST TECHNIQUE: Axial and coronal DWI sequences of the brain was obtained without intravenous contrast. Angiographic images of the head were obtained using MRA technique without contrast. COMPARISON:  07/09/2016 CT of the head. 04/10/2013 MRI of the brain. FINDINGS: MRI HEAD FINDINGS Brain: No diffusion restriction to suggest acute infarct. No abnormal susceptibility hypointensity to indicate intracranial hemorrhage. No significant T2 FLAIR signal abnormality. No focal mass effect. Question hemorrhage within basal ganglia on CT has signal characteristics consistent with globus pallidus mineralization, benign finding. Extra-axial space: No hydrocephalus. No extra-axial collection is identified. Proximal intracranial flow voids are maintained. Other: Moderate diffuse paranasal sinus mucosal thickening and small fluid levels in anterior ethmoid air cells. No abnormal signal of the mastoid air cells. Orbits are unremarkable. Calvarium is unremarkable. MRA HEAD FINDINGS Internal carotid arteries:  Patent. Anterior cerebral arteries:  Patent. Middle cerebral arteries: Patent. Anterior communicating artery: Patent. Posterior communicating arteries: Large left identified. No  right identified, hypoplastic or absent. Posterior cerebral arteries:  Patent. Basilar artery:  Patent. Vertebral arteries:  Patent. No occlusion, aneurysm, go or significant stenosis is identified. IMPRESSION: 1. No acute intracranial abnormality is identified. Questioned hemorrhage within basal ganglia on CT has signal characteristics consistent with globus pallidus mineralization, benign finding. 2. Patent circle of Willis. No occlusion, aneurysm, or significant stenosis is identified. 3. Moderate paranasal sinus disease with nonspecific fluid levels which may represent acute sinusitis. These results were called by telephone at the time of interpretation on 07/09/2016 at 8:27 pm to Dr. Francine Graven , who verbally acknowledged these results. Electronically Signed   By: Kristine Garbe M.D.   On: 07/09/2016 20:27    Subjective: Eager to go home  Discharge Exam: Vitals:   07/10/16 2000 07/11/16 0553  BP: 122/60 120/68  Pulse: 70 62  Resp:    Temp: 98 F (36.7 C) 98.1 F (36.7 C)   Vitals:   07/10/16 1700 07/10/16 1742 07/10/16 2000 07/11/16 0553  BP: 126/83 133/70 122/60 120/68  Pulse: 77 69 70 62  Resp:  18    Temp:  98.1 F (36.7 C) 98 F (36.7 C) 98.1 F (36.7 C)  TempSrc:  Oral Oral Oral  SpO2: 95% 95% 94% 91%  Weight:      Height:        General: Pt is alert, awake, not in acute distress Cardiovascular: RRR, S1/S2 +, no rubs, no gallops Respiratory: CTA bilaterally, no wheezing, no rhonchi Abdominal: Soft, NT, ND, bowel sounds + Extremities: no edema, no cyanosis   The results of significant diagnostics  from this hospitalization (including imaging, microbiology, ancillary and laboratory) are listed below for reference.     Microbiology: Recent Results (from the past 240 hour(s))  Microscopic Examination     Status: None   Collection Time: 07/02/16 10:55 AM  Result Value Ref Range Status   WBC, UA 0-5 0 - 5 /hpf Final   RBC, UA 0-2 0 - 2 /hpf Final    Epithelial Cells (non renal) 0-10 0 - 10 /hpf Final   Bacteria, UA Few None seen/Few Final  Urine culture     Status: Abnormal   Collection Time: 07/09/16  4:44 PM  Result Value Ref Range Status   Specimen Description URINE, RANDOM  Final   Special Requests NONE  Final   Culture MULTIPLE SPECIES PRESENT, SUGGEST RECOLLECTION (A)  Final   Report Status 07/11/2016 FINAL  Final  MRSA PCR Screening     Status: None   Collection Time: 07/09/16 10:45 PM  Result Value Ref Range Status   MRSA by PCR NEGATIVE NEGATIVE Final    Comment:        The GeneXpert MRSA Assay (FDA approved for NASAL specimens only), is one component of a comprehensive MRSA colonization surveillance program. It is not intended to diagnose MRSA infection nor to guide or monitor treatment for MRSA infections.      Labs: BNP (last 3 results) No results for input(s): BNP in the last 8760 hours. Basic Metabolic Panel:  Recent Labs Lab 07/09/16 1630 07/10/16 0808  NA 135 141  K 3.0* 4.0  CL 101 110  CO2 27 28  GLUCOSE 108* 96  BUN 22* 17  CREATININE 1.51* 0.80  CALCIUM 7.9* 8.1*   Liver Function Tests:  Recent Labs Lab 07/09/16 1630  AST 24  ALT 23  ALKPHOS 77  BILITOT 0.5  PROT 5.9*  ALBUMIN 3.2*   No results for input(s): LIPASE, AMYLASE in the last 168 hours. No results for input(s): AMMONIA in the last 168 hours. CBC:  Recent Labs Lab 07/09/16 1630 07/10/16 0442  WBC 14.8* 11.4*  NEUTROABS 9.8*  --   HGB 14.0 12.2  HCT 43.2 38.2  MCV 92.1 92.7  PLT 270 230   Cardiac Enzymes:  Recent Labs Lab 07/09/16 1630 07/09/16 2301 07/10/16 0442 07/10/16 1108  TROPONINI <0.03 <0.03 <0.03 <0.03   BNP: Invalid input(s): POCBNP CBG: No results for input(s): GLUCAP in the last 168 hours. D-Dimer  Recent Labs  07/09/16 1630  DDIMER 0.42   Hgb A1c No results for input(s): HGBA1C in the last 72 hours. Lipid Profile No results for input(s): CHOL, HDL, LDLCALC, TRIG, CHOLHDL,  LDLDIRECT in the last 72 hours. Thyroid function studies  Recent Labs  07/09/16 1630  TSH 1.136   Anemia work up No results for input(s): VITAMINB12, FOLATE, FERRITIN, TIBC, IRON, RETICCTPCT in the last 72 hours. Urinalysis    Component Value Date/Time   COLORURINE YELLOW 07/09/2016 [place] 07/09/2016 1644   APPEARANCEUR Clear 07/02/2016 1055   LABSPEC >1.030 (H) 07/09/2016 1644   PHURINE 5.5 07/09/2016 1644   GLUCOSEU NEGATIVE 07/09/2016 1644   HGBUR NEGATIVE 07/09/2016 1644   BILIRUBINUR SMALL (A) 07/09/2016 1644   BILIRUBINUR Negative 07/02/2016 1055   KETONESUR TRACE (A) 07/09/2016 1644   PROTEINUR 30 (A) 07/09/2016 1644   UROBILINOGEN negative 03/21/2015 1636   UROBILINOGEN 0.2 03/16/2012 0946   NITRITE NEGATIVE 07/09/2016 1644   LEUKOCYTESUR NEGATIVE 07/09/2016 1644   LEUKOCYTESUR 0-5 07/02/2016 1055   Sepsis Labs Invalid  input(s): PROCALCITONIN,  WBC,  LACTICIDVEN Microbiology Recent Results (from the past 240 hour(s))  Microscopic Examination     Status: None   Collection Time: 07/02/16 10:55 AM  Result Value Ref Range Status   WBC, UA 0-5 0 - 5 /hpf Final   RBC, UA 0-2 0 - 2 /hpf Final   Epithelial Cells (non renal) 0-10 0 - 10 /hpf Final   Bacteria, UA Few None seen/Few Final  Urine culture     Status: Abnormal   Collection Time: 07/09/16  4:44 PM  Result Value Ref Range Status   Specimen Description URINE, RANDOM  Final   Special Requests NONE  Final   Culture MULTIPLE SPECIES PRESENT, SUGGEST RECOLLECTION (A)  Final   Report Status 07/11/2016 FINAL  Final  MRSA PCR Screening     Status: None   Collection Time: 07/09/16 10:45 PM  Result Value Ref Range Status   MRSA by PCR NEGATIVE NEGATIVE Final    Comment:        The GeneXpert MRSA Assay (FDA approved for NASAL specimens only), is one component of a comprehensive MRSA colonization surveillance program. It is not intended to diagnose MRSA infection nor to guide or monitor  treatment for MRSA infections.      SIGNED:   Donne Hazel, MD  [facility] 07/11/2016, 11:00 AM  If 7PM-7AM, please contact night-coverage [email or web address] Password [email or web address]

## 2016-07-14 ENCOUNTER — Emergency Department (HOSPITAL_COMMUNITY)
Admission: EM | Admit: 2016-07-14 | Discharge: 2016-07-14 | Disposition: A | Discharge status: Home / Self Care | Payer: Medicare HMO | Attending: Emergency Medicine | Admitting: Emergency Medicine

## 2016-07-14 ENCOUNTER — Encounter (HOSPITAL_COMMUNITY): Payer: Self-pay | Admitting: Emergency Medicine

## 2016-07-14 ENCOUNTER — Telehealth: Payer: Self-pay | Admitting: Family

## 2016-07-14 DIAGNOSIS — F17210 Nicotine dependence, cigarettes, uncomplicated: Secondary | ICD-10-CM | POA: Insufficient documentation

## 2016-07-14 DIAGNOSIS — R42 Dizziness and giddiness: Secondary | ICD-10-CM

## 2016-07-14 DIAGNOSIS — R110 Nausea: Secondary | ICD-10-CM | POA: Diagnosis not present

## 2016-07-14 DIAGNOSIS — G4489 Other headache syndrome: Secondary | ICD-10-CM | POA: Diagnosis not present

## 2016-07-14 DIAGNOSIS — I10 Essential (primary) hypertension: Secondary | ICD-10-CM | POA: Diagnosis not present

## 2016-07-14 DIAGNOSIS — Z79899 Other long term (current) drug therapy: Secondary | ICD-10-CM | POA: Diagnosis not present

## 2016-07-14 LAB — I-STAT CHEM 8, ED
BUN: 3 mg/dL — ABNORMAL LOW (ref 6–20)
CALCIUM ION: 1.13 mmol/L — AB (ref 1.15–1.40)
Chloride: 93 mmol/L — ABNORMAL LOW (ref 101–111)
Creatinine, Ser: 0.8 mg/dL (ref 0.44–1.00)
GLUCOSE: 100 mg/dL — AB (ref 65–99)
HCT: 40 % (ref 36.0–46.0)
Hemoglobin: 13.6 g/dL (ref 12.0–15.0)
Potassium: 3.5 mmol/L (ref 3.5–5.1)
SODIUM: 140 mmol/L (ref 135–145)
TCO2: 36 mmol/L (ref 0–100)

## 2016-07-14 MED ORDER — METOCLOPRAMIDE HCL 5 MG/ML IJ SOLN
10.0000 mg | Freq: Once | INTRAMUSCULAR | Status: AC
  Administered 2016-07-14: 10 mg via INTRAVENOUS
  Filled 2016-07-14: qty 2

## 2016-07-14 MED ORDER — DIPHENHYDRAMINE HCL 50 MG/ML IJ SOLN
25.0000 mg | Freq: Once | INTRAMUSCULAR | Status: AC
  Administered 2016-07-14: 25 mg via INTRAVENOUS
  Filled 2016-07-14: qty 1

## 2016-07-14 MED ORDER — SODIUM CHLORIDE 0.9 % IV BOLUS (SEPSIS)
1000.0000 mL | Freq: Once | INTRAVENOUS | Status: AC
  Administered 2016-07-14: 1000 mL via INTRAVENOUS

## 2016-07-14 NOTE — Telephone Encounter (Signed)
Left message with details given by provider concerning medication changes.  Please return our call for discussion about medication.

## 2016-07-14 NOTE — ED Notes (Signed)
Patient was ambulated to the corner of the nurses station. Stated she was dizzy and patient became unbalanced. Patient had to stop at the nurses station and hold onto the desk in order to regain balance. 2 techs had to assist patient back to room. Patient stated she was weak.

## 2016-07-14 NOTE — Discharge Instructions (Signed)

## 2016-07-14 NOTE — ED Triage Notes (Addendum)
Pt reports hypertensive episodes with HA and nausea since Saturday. States her systolic pressure has been as high as 205.

## 2016-07-14 NOTE — Telephone Encounter (Signed)
Patient was hospitalized after a syncopal episode, was released this weekend to home.  Was treated for dehydration and hypokalemia.  Lisinopril/Hct was discontinued while patient was in the hospital.  Since coming home her BP has been running somewhere around 200/110.  Patient wanted to see Alyse Low today but I told her Alyse Low was not in today.  She wanted to wait and see her on Friday but I advised the patient that we needed to get her in with someone else today or she needed to go back to the ER for evaluation.  Patient going ahead to ER.  Patient requested hospital followup appointment on Friday only.  No appointments available with Alyse Low, so appointment made Friday morning with Dr. Wendi Snipes.

## 2016-07-14 NOTE — ED Notes (Signed)
Pt reports frontal headache x 3 days.  Denies neuro symptoms.

## 2016-07-14 NOTE — ED Notes (Signed)
Pt states her headache is gone at this time.

## 2016-07-14 NOTE — ED Provider Notes (Signed)
[facility] DEPT Provider Note   CSN: [id number] Arrival date & time: 07/14/16  1439     History   Chief Complaint Chief Complaint  Patient presents with  . Hypertension    HPI EIZABETH ZACARIAS is a 55 y.o. female.  The history is provided by the patient.  Hypertension  This is a recurrent problem. The current episode started more than 2 days ago. The problem occurs daily. The problem has been gradually worsening. Associated symptoms include headaches. Pertinent negatives include no chest pain and no shortness of breath. Nothing aggravates the symptoms. Nothing relieves the symptoms.  patient reports recent admission for "passing out" She reports that upon discharge she was told to hold her BP meds After leaving the hospital about 3 days ago she has had diffuse HA, nausea and elevated BP ([id number], systolics in [id number]) She denies new CP No fever/vomiting Due to her BP being elevated she re-started her BP meds (atenolol 50mg  and also lisinopril/HCTZ)  No focal weakness No visual changes   Past Medical History:  Diagnosis Date  . Abdominal pain   . Anxiety   . Arthritis   . Blood in stool   . Breast changes, fibrocystic   . Bruises easily   . Clotting disorder (HCC)     itp , none since 1976, no current hematologist  . Constipation   . DDD (degenerative disc disease), cervical    with lumbar issues  . Depression   . Generalized headaches   . GERD (gastroesophageal reflux disease)   . Hemorrhoid   . Hyperlipidemia   . Hypertension   . Idiopathic thrombocytopenia ([place])    as child  . Leg swelling    both ankles  . Liver lesion   . Nasal congestion   . Nausea & vomiting   . Neuromuscular disorder ([place])    back  injury  . Neuromuscular disorder ([place])    3 neck surgeries,neck fusion.pin,plates,screws  . Osteoporosis   . Panic attacks    pt also  has germaphobia  . Rectal bleeding   . Seizures ([place]) 1998   stress induced, one time, none since, no seizure  meds  . Seizures ([place])    Stated she had 2 weeks ago,it was like a transient,stare.  . Sleep apnea    STOPBANG=4  . Spleen enlarged 1976, none since   r/t idiopathic thrombocytopenia  . Trouble swallowing   . Unintentional weight loss   . Weakness    weakness varies    Patient Active Problem List   Diagnosis Date Noted  . Dehydration 07/10/2016  . Hypotension 07/09/2016  . Syncope 07/09/2016  . AKI (acute kidney injury) ([place]) 07/09/2016  . Hypokalemia 07/09/2016  . Obesity (BMI 30-39.9) 07/02/2016  . Osteopenia 12/20/2013  . Diverticulosis of colon (without mention of hemorrhage) 02/04/2012  . GERD (gastroesophageal reflux disease) 02/04/2012  . Dysphagia 02/04/2012  . Unspecified gastritis and gastroduodenitis without mention of hemorrhage 02/04/2012  . Cystic liver mass 01/26/2012  . Cholecystitis chronic 01/26/2012  . SYNCOPE 12/20/2007  . GAD (generalized anxiety disorder) 01/25/2007  . Depression 01/25/2007  . Essential hypertension 01/25/2007  . BREAST MASSES, BILATERAL 01/25/2007  . DEGENERATIVE DISC DISEASE, CERVICAL SPINE 01/25/2007  . LOW BACK PAIN 01/25/2007  . SEIZURE DISORDER 01/25/2007  . INSOMNIA 01/25/2007  . DEPENDENT EDEMA, LEGS, BILATERAL 01/25/2007  . Current smoker 01/25/2007  . TAH/BSO, HX OF 01/25/2007    Past Surgical History:  Procedure Laterality Date  . ABDOMINAL HYSTERECTOMY  1997  .  CERVICAL SPINE SURGERY  2005, 2007, 2010   x3, fusion done,plates and screws present  . [place]  . CHOLECYSTECTOMY  03/14/2012   Procedure: LAPAROSCOPIC CHOLECYSTECTOMY;  Surgeon: Stark Klein, MD;  Location: [facility]S;  Service: General;  Laterality: N/A;  . colonscopy and esophagogastrodudononescopy  02-04-12  . LUMBAR DISC SURGERY  11/2014    OB History    No data available       Home Medications    Prior to Admission medications   Medication Sig Start Date End Date Taking? Authorizing Provider  ALPRAZolam Duanne Moron) 0.5 MG tablet  Take 1 tablet (0.5 mg total) by mouth 3 (three) times daily as needed. 07/02/16   Sharion Balloon, FNP  atenolol (TENORMIN) 25 MG tablet Take 1 tablet (25 mg total) by mouth daily. 07/11/16   Donne Hazel, MD  citalopram (CELEXA) 40 MG tablet Take 1 tablet (40 mg total) by mouth daily with breakfast. 07/02/16   Sharion Balloon, FNP  gabapentin (NEURONTIN) 300 MG capsule Take 600 mg by mouth 3 (three) times daily.  09/28/13   Historical Provider, MD  gemfibrozil (LOPID) 600 MG tablet Take 1 tablet (600 mg total) by mouth 2 (two) times daily before a meal. 07/09/16   Sharion Balloon, FNP  HYDROcodone-acetaminophen (NORCO) 10-325 MG per tablet Take 1-2 tablets by mouth every 6 (six) hours as needed for pain. For pain 03/17/12   Stark Klein, MD  metoCLOPramide (REGLAN) 10 MG tablet Take 1 tablet (10 mg total) by mouth at bedtime. 11/19/15   Milus Banister, MD  ondansetron (ZOFRAN) 4 MG tablet Take 1 tablet by mouth daily as needed. 04/02/16   Historical Provider, MD  pantoprazole (PROTONIX) 40 MG tablet Take 1 tablet (40 mg total) by mouth 2 (two) times daily. 11/06/15   Milus Banister, MD  traZODone (DESYREL) 100 MG tablet Take 1 tablet (100 mg total) by mouth at bedtime. 07/02/16   Sharion Balloon, FNP  Vitamin D, Ergocalciferol, (DRISDOL) 50000 units CAPS capsule Take 1 capsule (50,000 Units total) by mouth every 7 (seven) days. 07/03/16   Sharion Balloon, FNP  VOLTAREN 1 % GEL Apply 2 g topically 4 (four) times daily as needed.  07/24/14   Historical Provider, MD    Family History Family History  Problem Relation Age of Onset  . Hypertension Mother   . Heart disease Mother   . Lung cancer Father   . Cancer Father     lung  . Colon cancer Paternal Grandmother   . Cancer Paternal Grandmother     colon  . Heart disease Maternal Grandmother   . Diabetes Sister     Social History Social History  Substance Use Topics  . Smoking status: Current Every Day Smoker    Packs/day: 1.00    Years: 5.00      Types: Cigarettes    Start date: 12/20/2009  . Smokeless tobacco: Never Used     Comment: VAPOR- OCCASIONALLY  . Alcohol use No     Allergies   Nsaids   Review of Systems Review of Systems  Constitutional: Negative for fever.  Respiratory: Negative for shortness of breath.   Cardiovascular: Negative for chest pain.  Gastrointestinal: Negative for vomiting.  Neurological: Positive for headaches. Negative for weakness.  All other systems reviewed and are negative.    Physical Exam Updated Vital Signs BP 127/69   Pulse 69   Temp 98 F (36.7 C) (Oral)   Resp  16   Ht 5\' 4"  (1.626 m)   Wt 84.4 kg   SpO2 90%   BMI 31.93 kg/m   Physical Exam CONSTITUTIONAL: Well developed/well nourished HEAD: Normocephalic/atraumatic EYES: EOMI/PERRL ENMT: Mucous membranes moist NECK: supple no meningeal signs SPINE/BACK:entire spine nontender CV: S1/S2 noted, no murmurs/rubs/gallops noted LUNGS: Lungs are clear to auscultation bilaterally, no apparent distress ABDOMEN: soft, nontender, no rebound or guarding, bowel sounds noted throughout abdomen GU:no cva tenderness NEURO: Pt is awake/alert/appropriate, moves all extremitiesx4.  No facial droop.  No arm or leg drift.  No past pointing.   EXTREMITIES: pulses normal/equal, full ROM SKIN: warm, color normal PSYCH: no abnormalities of mood noted, alert and oriented to situation   ED Treatments / Results  Labs (all labs ordered are listed, but only abnormal results are displayed) Labs Reviewed  I-STAT CHEM 8, ED - Abnormal; Notable for the following:       Result Value   Chloride 93 (*)    BUN 3 (*)    Glucose, Bld 100 (*)    Calcium, Ion 1.13 (*)    All other components within normal limits    EKG  EKG Interpretation  Date/Time:  Tuesday July 14 2016 17:54:08 EDT Ventricular Rate:  63 PR Interval:    QRS Duration: 90 QT Interval:  445 QTC Calculation: 456 R Axis:   17 Text Interpretation:  Sinus rhythm  Abnormal R-wave progression, early transition Nonspecific T abnormalities, anterior leads Baseline wander in lead(s) V1 No significant change since last tracing Confirmed by [place] ([id number]) on 07/14/2016 5:57:03 PM       Radiology No results found.  Procedures Procedures (including critical care time)  Medications Ordered in ED Medications  metoCLOPramide (REGLAN) injection 10 mg (10 mg Intravenous Given 07/14/16 1642)  diphenhydrAMINE (BENADRYL) injection 25 mg (25 mg Intravenous Given 07/14/16 1643)  sodium chloride 0.9 % bolus 1,000 mL (1,000 mLs Intravenous New Bag/Given 07/14/16 1758)     Initial Impression / Assessment and Plan / ED Course  I have reviewed the triage vital signs and the nursing notes.  Pertinent labs results that were available during my care of the patient were reviewed by me and considered in my medical decision making (see chart for details).  Clinical Course    Pt with recent admission for syncope She had extensive workup It was felt due to BP meds (pt was dehydrated, and low BP) She is now concerned due to HA and elevated BP It is improved here Labs reassuring Pt with recent admission with reassuring workup including negative MRI brain  6:23 PM Pt improved BP improved She is ambulatory (she reports dizziness but thinks it is due to reglan/benadryl) She reports HA resolved No focal weakness No CP No new SOB  No cough She feels well for d/c home She has PCP followup this week Advised she can continue her home BP meds until seen by PCP Room air pulse ox 94-95% and she is asymptomatic   Final Clinical Impressions(s) / ED Diagnoses   Final diagnoses:  Other headache syndrome  Dizziness    New Prescriptions New Prescriptions   No medications on file     Ripley Fraise, MD 07/14/16 1827

## 2016-07-17 ENCOUNTER — Ambulatory Visit (INDEPENDENT_AMBULATORY_CARE_PROVIDER_SITE_OTHER): Payer: Medicare HMO | Admitting: Family Medicine

## 2016-07-17 ENCOUNTER — Encounter: Payer: Self-pay | Admitting: Family Medicine

## 2016-07-17 VITALS — BP 124/81 | HR 67 | Temp 97.0°F | Ht 64.0 in | Wt 188.8 lb

## 2016-07-17 DIAGNOSIS — I10 Essential (primary) hypertension: Secondary | ICD-10-CM | POA: Diagnosis not present

## 2016-07-17 DIAGNOSIS — E785 Hyperlipidemia, unspecified: Secondary | ICD-10-CM

## 2016-07-17 DIAGNOSIS — R55 Syncope and collapse: Secondary | ICD-10-CM

## 2016-07-17 MED ORDER — ATORVASTATIN CALCIUM 20 MG PO TABS: 20.0000 mg | ORAL_TABLET | Freq: Every day | ORAL | 3 refills | Status: DC

## 2016-07-17 NOTE — Patient Instructions (Signed)
Great to see you!  Lets follow up in 1 month for repeat labs.   I have started you on Lipitor, 1 pill once  a day

## 2016-07-17 NOTE — Progress Notes (Signed)
   HPI  Patient presents today here for hospital follow-up.  Patient was hospitalized on 831/2017 for hypotension with syncope and acute kidney injury.  Her kidney function resolved with fluids and time. It was felt that she was dehydrated and had transient hypotension. She extensive workup including CT of the head, MRI of the brain, and echocardiogram.  Since leaving the hospital she states that she's been doing well. She had one more emergency room visit for headache, dizziness, and hypertension. She restarted her medications and her blood pressure is well controlled since.  She denies any additional syncope, presyncope, dizziness, or headaches since the emergency room visit   She would like to discuss Lipitor.   PMH: Smoking status noted ROS: Per HPI  Objective: BP 124/81 (BP Location: Right Arm, Patient Position: Sitting, Cuff Size: Normal)   Pulse 67   Temp 97 F (36.1 C) (Oral)   Ht 5\' 4"  (1.626 m)   Wt 188 lb 12.8 oz (85.6 kg)   BMI 32.41 kg/m  Gen: NAD, alert, cooperative with exam HEENT: NCAT CV: RRR, good S1/S2, no murmur Resp: CTABL, no wheezes, non-labored Ext: No edema, warm Neuro: Alert and oriented, No gross deficits  Assessment and plan:  # Hyperlipidemia ASCVD risk score is 9.6% in 10 years, start Lipitor Repeat labs in one month  # Syncope, acute kidney injury Acute kidney injury was completely resolved before discharge from the hospital She's had follow-up labs in the ER. Syncope has also resolved, no additional symptoms  # Hypertension Well controlled currently Her symptoms of syncope were felt to be due to transient hypotension Repeat labs in one month with a recent acute kidney injury    Meds ordered this encounter  Medications  . atorvastatin (LIPITOR) 20 MG tablet    Sig: Take 1 tablet (20 mg total) by mouth daily.    Dispense:  90 tablet    Refill:  [place], MD [facility] 07/17/2016, 11:52  AM

## 2016-08-03 ENCOUNTER — Ambulatory Visit (INDEPENDENT_AMBULATORY_CARE_PROVIDER_SITE_OTHER): Payer: Medicare HMO | Admitting: Cardiology

## 2016-08-03 ENCOUNTER — Encounter: Payer: Self-pay | Admitting: Cardiology

## 2016-08-03 ENCOUNTER — Encounter: Payer: Self-pay | Admitting: *Deleted

## 2016-08-03 VITALS — BP 0/0 | HR 73 | Ht 64.0 in | Wt 191.0 lb

## 2016-08-03 DIAGNOSIS — I10 Essential (primary) hypertension: Secondary | ICD-10-CM

## 2016-08-03 DIAGNOSIS — R55 Syncope and collapse: Secondary | ICD-10-CM

## 2016-08-03 DIAGNOSIS — I951 Orthostatic hypotension: Secondary | ICD-10-CM

## 2016-08-03 DIAGNOSIS — R079 Chest pain, unspecified: Secondary | ICD-10-CM

## 2016-08-03 MED ORDER — LISINOPRIL 20 MG PO TABS: 20.0000 mg | ORAL_TABLET | Freq: Every day | ORAL | 6 refills | Status: DC

## 2016-08-03 MED ORDER — ASPIRIN EC 81 MG PO TBEC: 81.0000 mg | DELAYED_RELEASE_TABLET | Freq: Every day | ORAL | Status: DC

## 2016-08-03 NOTE — Progress Notes (Signed)
Clinical Summary [name] is a 55 y.o.female seen as a new patient. She is referred by NP [name].   1. HTN - compliant with meds  2. Syncope/Orhtostatic hypotension - prior admission with syncope in setting of dehydration and AKI. She reports poor oral intake around that time   - after discharge continued to have low bp's at home with sitting, can be in the high 60s.  - continued symptoms with position changes. Has had recurrent falls since discahrge.  - symptoms ongoing for 6-7 months.  - . Vitamin water 1-2 bottles (20 ounces). Cut out sodas. Rare tea. 1 cup coffee in AM. No EtoH.   3. Abnormal EKG/Chest pain - chest pain started about 1 year. Dull pain 3-4/10. Occurs at rest and with activity. +SOB. Can feel nausesous. Not positional. Lasts a few minutes. Occurs 2-3 times a week.  - DOE at store walking, or doing housework  CAD risk factors: HTN, HL, +smoker, borderline DM2, maternal grandfather with orthostatic syncope. Thinks her biological father had heart problems.  Past Medical History:  Diagnosis Date  . Abdominal pain   . Anxiety   . Arthritis   . Blood in stool   . Breast changes, fibrocystic   . Bruises easily   . Clotting disorder (HCC)     itp , none since 1976, no current hematologist  . Constipation   . DDD (degenerative disc disease), cervical    with lumbar issues  . Depression   . Generalized headaches   . GERD (gastroesophageal reflux disease)   . Hemorrhoid   . Hyperlipidemia   . Hypertension   . Idiopathic thrombocytopenia ([place])    as child  . Leg swelling    both ankles  . Liver lesion   . Nasal congestion   . Nausea & vomiting   . Neuromuscular disorder ([place])    back  injury  . Neuromuscular disorder ([place])    3 neck surgeries,neck fusion.pin,plates,screws  . Osteoporosis   . Panic attacks    pt also  has germaphobia  . Rectal bleeding   . Seizures ([place]) 1998   stress induced, one time, none since, no seizure meds  .  Seizures ([place])    Stated she had 2 weeks ago,it was like a transient,stare.  . Sleep apnea    STOPBANG=4  . Spleen enlarged 1976, none since   r/t idiopathic thrombocytopenia  . Trouble swallowing   . Unintentional weight loss   . Weakness    weakness varies     Allergies  Allergen Reactions  . Nsaids Nausea Only    Abdominal pain     Current Outpatient Prescriptions  Medication Sig Dispense Refill  . ALPRAZolam (XANAX) 0.5 MG tablet Take 1 tablet (0.5 mg total) by mouth 3 (three) times daily as needed. (Patient taking differently: Take 0.5 mg by mouth 3 (three) times daily as needed for anxiety. ) 90 tablet 1  . atenolol (TENORMIN) 25 MG tablet Take 1 tablet (25 mg total) by mouth daily. (Patient taking differently: Take 50 mg by mouth daily. ) 30 tablet 0  . atorvastatin (LIPITOR) 20 MG tablet Take 1 tablet (20 mg total) by mouth daily. 90 tablet 3  . citalopram (CELEXA) 40 MG tablet Take 1 tablet (40 mg total) by mouth daily with breakfast. 90 tablet 1  . gabapentin (NEURONTIN) 300 MG capsule Take 600 mg by mouth 3 (three) times daily.     Marland Kitchen lisinopril-hydrochlorothiazide (PRINZIDE,ZESTORETIC) 20-25 MG tablet Take  1 tablet by mouth daily.    . metoCLOPramide (REGLAN) 10 MG tablet Take 1 tablet (10 mg total) by mouth at bedtime. 30 tablet 11  . ondansetron (ZOFRAN) 4 MG tablet Take 1 tablet by mouth daily as needed for nausea or vomiting.     . pantoprazole (PROTONIX) 40 MG tablet Take 1 tablet (40 mg total) by mouth 2 (two) times daily. 180 tablet 3  . traZODone (DESYREL) 100 MG tablet Take 1 tablet (100 mg total) by mouth at bedtime. 90 tablet 1  . Vitamin D, Ergocalciferol, (DRISDOL) 50000 units CAPS capsule Take 1 capsule (50,000 Units total) by mouth every 7 (seven) days. 12 capsule 3  . VOLTAREN 1 % GEL Apply 2 g topically 4 (four) times daily as needed (for pain).      No current facility-administered medications for this visit.      Past Surgical History:  Procedure  Laterality Date  . ABDOMINAL HYSTERECTOMY  1997  . CERVICAL SPINE SURGERY  2005, 2007, 2010   x3, fusion done,plates and screws present  . [place]  . CHOLECYSTECTOMY  03/14/2012   Procedure: LAPAROSCOPIC CHOLECYSTECTOMY;  Surgeon: Stark Klein, MD;  Location: [facility];  Service: General;  Laterality: N/A;  . colonscopy and esophagogastrodudononescopy  02-04-12  . LUMBAR DISC SURGERY  11/2014     Allergies  Allergen Reactions  . Nsaids Nausea Only    Abdominal pain      Family History  Problem Relation Age of Onset  . Hypertension Mother   . Heart disease Mother   . Lung cancer Father   . Cancer Father     lung  . Colon cancer Paternal Grandmother   . Cancer Paternal Grandmother     colon  . Heart disease Maternal Grandmother   . Diabetes Sister      Social History [name] reports that she has been smoking Cigarettes.  She started smoking about 6 years ago. She has a 5.00 pack-year smoking history. She has never used smokeless tobacco. [name] reports that she does not drink alcohol.   Review of Systems CONSTITUTIONAL: No weight loss, fever, chills, weakness or fatigue.  HEENT: Eyes: No visual loss, blurred vision, double vision or yellow sclerae.No hearing loss, sneezing, congestion, runny nose or sore throat.  SKIN: No rash or itching.  CARDIOVASCULAR: per HPI RESPIRATORY: per HPI GASTROINTESTINAL: No anorexia, nausea, vomiting or diarrhea. No abdominal pain or blood.  GENITOURINARY: No burning on urination, no polyuria NEUROLOGICAL: per HPI MUSCULOSKELETAL: No muscle, back pain, joint pain or stiffness.  LYMPHATICS: No enlarged nodes. No history of splenectomy.  PSYCHIATRIC: No history of depression or anxiety.  ENDOCRINOLOGIC: No reports of sweating, cold or heat intolerance. No polyuria or polydipsia.  Marland Kitchen   Physical Examination Vitals:   08/03/16 1441 08/03/16 1442  BP: (!) 94/58 (S) (!) 0/0  Pulse: 73    Vitals:   08/03/16  1432  Weight: 191 lb (86.6 kg)  Height: 5\' 4"  (1.626 m)    Gen: resting comfortably, no acute distress HEENT: no scleral icterus, pupils equal round and reactive, no palptable cervical adenopathy,  CV: RRR, no m/rg, no jvd Resp: Clear to auscultation bilaterally GI: abdomen is soft, non-tender, non-distended, normal bowel sounds, no hepatosplenomegaly MSK: extremities are warm, no edema.  Skin: warm, no rash Neuro:  no focal deficits Psych: appropriate affect   Diagnostic Studies  07/2016 echo Study Conclusions  - Left ventricle: The cavity size was normal. Wall thickness was  increased in a pattern of mild LVH. Systolic function was   vigorous. The estimated ejection fraction was in the range of 65%   to 70%. Left ventricular diastolic function parameters were   normal. - Mitral valve: There was mild regurgitation. - Pericardium, extracardiac: A trivial pericardial effusion was   identified.   Assessment and Plan  1. Orthostatic hypotension/syncope - remains severely orthostatic in clinic today - we will d/c her HCTZ. Encouraged increased oral hydration. Follow symptoms  2. Chest pain - multiple CAD risk factors. Nonspecific anterior TWIs on EKG - we will obtain lexiscan to further evaluate - start ASA 81mg  daily  3. HTN - more recent issues with low bp's and orthostatic hypotension - stop hctz. Continue lisionpril 20mg  daily for now.    F/u 1 month      Arnoldo Lenis, M.D.

## 2016-08-03 NOTE — Patient Instructions (Signed)
Medication Instructions:   Stop Prinzide.  Begin Lisinopril 20mg  daily.  Begin Aspirin 81mg  daily.  Continue all other medications.    Labwork: none  Testing/Procedures:  Your physician has requested that you have a lexiscan myoview. For further information please visit [email or web address]. Please follow instruction sheet, as given.  Office will contact with results via phone or letter.    Follow-Up: 1 month   Any Other Special Instructions Will Be Listed Below (If Applicable).  If you need a refill on your cardiac medications before your next appointment, please call your pharmacy.

## 2016-08-06 ENCOUNTER — Encounter (HOSPITAL_COMMUNITY)
Admission: RE | Admit: 2016-08-06 | Discharge: 2016-08-06 | Disposition: A | Discharge status: Home / Self Care | Payer: Medicare HMO | Source: Ambulatory Visit | Attending: Cardiology | Admitting: Cardiology

## 2016-08-06 ENCOUNTER — Encounter (HOSPITAL_COMMUNITY): Payer: Self-pay

## 2016-08-06 ENCOUNTER — Inpatient Hospital Stay (HOSPITAL_COMMUNITY): Admission: RE | Admit: 2016-08-06 | Payer: Medicare HMO | Source: Ambulatory Visit

## 2016-08-06 DIAGNOSIS — R079 Chest pain, unspecified: Secondary | ICD-10-CM | POA: Diagnosis not present

## 2016-08-06 LAB — NM MYOCAR MULTI W/SPECT W/WALL MOTION / EF
CHL CUP NUCLEAR SRS: 0
CHL CUP NUCLEAR SSS: 4
LV dias vol: 46 mL (ref 46–106)
LV sys vol: 8 mL
Peak HR: 83 {beats}/min
RATE: 0
Rest HR: 62 {beats}/min
SDS: 4
TID: 1.1

## 2016-08-06 MED ORDER — SODIUM CHLORIDE 0.9% FLUSH
INTRAVENOUS | Status: AC
  Administered 2016-08-06: 10 mL via INTRAVENOUS
  Filled 2016-08-06: qty 10

## 2016-08-06 MED ORDER — TECHNETIUM TC 99M TETROFOSMIN IV KIT
30.0000 | PACK | Freq: Once | INTRAVENOUS | Status: AC | PRN
  Administered 2016-08-06: 30 via INTRAVENOUS

## 2016-08-06 MED ORDER — TECHNETIUM TC 99M TETROFOSMIN IV KIT
10.0000 | PACK | Freq: Once | INTRAVENOUS | Status: AC | PRN
  Administered 2016-08-06: 10 via INTRAVENOUS

## 2016-08-06 MED ORDER — REGADENOSON 0.4 MG/5ML IV SOLN
INTRAVENOUS | Status: AC
  Administered 2016-08-06: 0.4 mg via INTRAVENOUS
  Filled 2016-08-06: qty 5

## 2016-08-18 ENCOUNTER — Ambulatory Visit: Payer: Medicare HMO | Admitting: Family Medicine

## 2016-09-02 ENCOUNTER — Ambulatory Visit: Payer: Medicare HMO | Admitting: Cardiology

## 2016-10-05 ENCOUNTER — Other Ambulatory Visit: Payer: Self-pay | Admitting: Family

## 2016-10-06 ENCOUNTER — Encounter: Payer: Self-pay | Admitting: Family

## 2016-10-06 ENCOUNTER — Ambulatory Visit (INDEPENDENT_AMBULATORY_CARE_PROVIDER_SITE_OTHER): Payer: Medicare HMO | Admitting: Family

## 2016-10-06 VITALS — BP 137/91 | HR 64

## 2016-10-06 DIAGNOSIS — Z23 Encounter for immunization: Secondary | ICD-10-CM

## 2016-10-06 DIAGNOSIS — I10 Essential (primary) hypertension: Secondary | ICD-10-CM | POA: Diagnosis not present

## 2016-10-06 DIAGNOSIS — M5030 Other cervical disc degeneration, unspecified cervical region: Secondary | ICD-10-CM

## 2016-10-06 DIAGNOSIS — F331 Major depressive disorder, recurrent, moderate: Secondary | ICD-10-CM

## 2016-10-06 DIAGNOSIS — G4700 Insomnia, unspecified: Secondary | ICD-10-CM

## 2016-10-06 DIAGNOSIS — E785 Hyperlipidemia, unspecified: Secondary | ICD-10-CM | POA: Diagnosis not present

## 2016-10-06 DIAGNOSIS — F17200 Nicotine dependence, unspecified, uncomplicated: Secondary | ICD-10-CM

## 2016-10-06 DIAGNOSIS — E669 Obesity, unspecified: Secondary | ICD-10-CM | POA: Diagnosis not present

## 2016-10-06 DIAGNOSIS — G8929 Other chronic pain: Secondary | ICD-10-CM

## 2016-10-06 DIAGNOSIS — M545 Low back pain: Secondary | ICD-10-CM

## 2016-10-06 DIAGNOSIS — M8580 Other specified disorders of bone density and structure, unspecified site: Secondary | ICD-10-CM

## 2016-10-06 DIAGNOSIS — F411 Generalized anxiety disorder: Secondary | ICD-10-CM

## 2016-10-06 DIAGNOSIS — K219 Gastro-esophageal reflux disease without esophagitis: Secondary | ICD-10-CM | POA: Diagnosis not present

## 2016-10-06 MED ORDER — ALPRAZOLAM 0.5 MG PO TABS: 0.5000 mg | ORAL_TABLET | Freq: Two times a day (BID) | ORAL | 3 refills | Status: DC | PRN

## 2016-10-06 MED ORDER — TRAZODONE HCL 100 MG PO TABS: 100.0000 mg | ORAL_TABLET | Freq: Every day | ORAL | 1 refills | Status: DC

## 2016-10-06 MED ORDER — CITALOPRAM HYDROBROMIDE 40 MG PO TABS: 40.0000 mg | ORAL_TABLET | Freq: Every day | ORAL | 1 refills | Status: DC

## 2016-10-06 NOTE — Progress Notes (Signed)
Subjective:    Patient ID: [name], female    DOB: 22-Aug-1961, 55 y.o.   MRN: [id number]  Pt presents to the office today for chronic follow up. Pt is followed by Ortho every month who prescribes pain medication for her neck and low back pain. Pt is followed by GI annually. PT saw a cardiologists for abnormal EKG. PT has follow up on 10/26/16. Pt was seen by [facility] for GAD, Depression, and Insomnia. Pt wants to stop going there and wants me to start prescribing her medications for her.    Hypertension  This is a chronic problem. The current episode started more than 1 year ago. The problem has been resolved since onset. The problem is uncontrolled. Associated symptoms include anxiety. Pertinent negatives include no palpitations, peripheral edema or shortness of breath. Risk factors for coronary artery disease include dyslipidemia, obesity, family history, post-menopausal state, smoking/tobacco exposure and sedentary lifestyle. Past treatments include diuretics, ACE inhibitors and beta blockers. The current treatment provides moderate improvement. There is no history of kidney disease, CAD/MI, CVA or heart failure. There is no history of sleep apnea.  Hyperlipidemia  This is a chronic problem. The current episode started more than 1 year ago. The problem is uncontrolled. Recent lipid tests were reviewed and are high. Exacerbating diseases include obesity. Factors aggravating her hyperlipidemia include smoking. Associated symptoms include a focal sensory loss. Pertinent negatives include no shortness of breath. Current antihyperlipidemic treatment includes statins. The current treatment provides moderate improvement of lipids. Risk factors for coronary artery disease include dyslipidemia, obesity, hypertension, post-menopausal and a sedentary lifestyle.  Gastroesophageal Reflux  She complains of heartburn. She reports no dysphagia or no stridor. This is a chronic problem. The current episode  started more than 1 year ago. The problem occurs rarely. The problem has been resolved. The symptoms are aggravated by lying down and smoking. Pertinent negatives include no weight loss. Risk factors include obesity and smoking/tobacco exposure. She has tried a PPI for the symptoms. The treatment provided significant relief.  Anxiety  Presents for follow-up visit. Symptoms include depressed mood, insomnia and irritability. Patient reports no excessive worry, nervous/anxious behavior, palpitations, restlessness, shortness of breath or suicidal ideas. Symptoms occur rarely. The quality of sleep is fair.    Depression         This is a chronic problem.  The current episode started more than 1 year ago.   The onset quality is gradual.   The problem occurs intermittently.  The problem has been resolved since onset.  Associated symptoms include insomnia.  Associated symptoms include not irritable, no restlessness, not sad and no suicidal ideas.  Compliance with treatment is good.  Previous treatment provided moderate relief.  Past medical history includes anxiety.   Back Pain  This is a chronic problem. The current episode started more than 1 year ago. The problem occurs intermittently. The problem has been waxing and waning since onset. The pain is present in the lumbar spine. The quality of the pain is described as aching. The pain is at a severity of 7/10. The pain is moderate. The symptoms are aggravated by bending and position. Pertinent negatives include no weight loss. She has tried analgesics and bed rest for the symptoms. The treatment provided moderate relief.  Neck Pain   This is a chronic problem. The current episode started more than 1 year ago. The problem occurs constantly. The problem has been waxing and waning. The pain is associated with a remote injury. The  pain is at a severity of 7/10. The pain is moderate. Pertinent negatives include no weight loss.  Insomnia  Primary symptoms: difficulty  falling asleep, frequent awakening.  The current episode started more than one year. The onset quality is gradual. The problem occurs intermittently. The problem has been resolved since onset. The treatment provided moderate relief. PMH includes: depression.      Review of Systems  Constitutional: Positive for irritability. Negative for weight loss.  Respiratory: Negative for shortness of breath.   Cardiovascular: Negative for palpitations.  Gastrointestinal: Positive for heartburn. Negative for dysphagia.  Psychiatric/Behavioral: Positive for depression. Negative for suicidal ideas. The patient has insomnia. The patient is not nervous/anxious.        Objective:   Physical Exam  Constitutional: She is oriented to person, place, and time. She appears well-developed and well-nourished. She is not irritable. No distress.  HENT:  Head: Normocephalic and atraumatic.  Right Ear: External ear normal.  Left Ear: External ear normal.  Nose: Nose normal.  Mouth/Throat: Oropharynx is clear and moist.  Eyes: Pupils are equal, round, and reactive to light.  Neck: Normal range of motion. Neck supple. No thyromegaly present.  Cardiovascular: Normal rate, regular rhythm, normal heart sounds and intact distal pulses.   No murmur heard. Pulmonary/Chest: Effort normal and breath sounds normal. No respiratory distress. She has no wheezes.  Abdominal: Soft. Bowel sounds are normal. She exhibits no distension. There is no tenderness.  Musculoskeletal: Normal range of motion. She exhibits no edema or tenderness.  Neurological: She is alert and oriented to person, place, and time. She has normal reflexes. No cranial nerve deficit.  Skin: Skin is warm and dry.  Psychiatric: She has a normal mood and affect. Her behavior is normal. Judgment and thought content normal.  Vitals reviewed.   BP (!) 137/91   Pulse 64        Assessment & Plan:  1. Essential hypertension - CMP14+EGFR  2.  Gastroesophageal reflux disease, esophagitis presence not specified - CMP14+EGFR  3. DEGENERATIVE DISC DISEASE, CERVICAL SPINE - CMP14+EGFR  4. Osteopenia, unspecified location - CMP14+EGFR  5. Obesity (BMI 30-39.9) - CMP14+EGFR  6. Chronic bilateral low back pain, with sciatica presence unspecified - CMP14+EGFR  7. Insomnia, unspecified type - CMP14+EGFR - traZODone (DESYREL) 100 MG tablet; Take 1 tablet (100 mg total) by mouth at bedtime.  Dispense: 90 tablet; Refill: 1  8. GAD (generalized anxiety disorder) -Long discussion about cutting back on her xanax. Pt currently Norco. We will decrease her xanax to BID from TID. PT has been on xanax for the last 20+ years.  - CMP14+EGFR - ALPRAZolam (XANAX) 0.5 MG tablet; Take 1 tablet (0.5 mg total) by mouth 2 (two) times daily as needed.  Dispense: 60 tablet; Refill: 3 - citalopram (CELEXA) 40 MG tablet; Take 1 tablet (40 mg total) by mouth daily with breakfast.  Dispense: 90 tablet; Refill: 1  9. Moderate episode of recurrent major depressive disorder (HCC) - CMP14+EGFR - citalopram (CELEXA) 40 MG tablet; Take 1 tablet (40 mg total) by mouth daily with breakfast.  Dispense: 90 tablet; Refill: 1  10. Current smoker - CMP14+EGFR  11. Hyperlipidemia, unspecified hyperlipidemia type - CMP14+EGFR - Lipid panel   Continue all meds Labs pending Health Maintenance reviewed Diet and exercise encouraged RTO 3 months  Evelina Dun, FNP

## 2016-10-06 NOTE — Patient Instructions (Signed)
Generalized Anxiety Disorder Generalized anxiety disorder (GAD) is a mental disorder. It interferes with life functions, including relationships, work, and school. GAD is different from normal anxiety, which everyone experiences at some point in their lives in response to specific life events and activities. Normal anxiety actually helps us prepare for and get through these life events and activities. Normal anxiety goes away after the event or activity is over.  GAD causes anxiety that is not necessarily related to specific events or activities. It also causes excess anxiety in proportion to specific events or activities. The anxiety associated with GAD is also difficult to control. GAD can vary from mild to severe. People with severe GAD can have intense waves of anxiety with physical symptoms (panic attacks).  SYMPTOMS The anxiety and worry associated with GAD are difficult to control. This anxiety and worry are related to many life events and activities and also occur more days than not for 6 months or longer. People with GAD also have three or more of the following symptoms (one or more in children):  Restlessness.   Fatigue.  Difficulty concentrating.   Irritability.  Muscle tension.  Difficulty sleeping or unsatisfying sleep. DIAGNOSIS GAD is diagnosed through an assessment by your health care provider. Your health care provider will ask you questions aboutyour mood,physical symptoms, and events in your life. Your health care provider may ask you about your medical history and use of alcohol or drugs, including prescription medicines. Your health care provider may also do a physical exam and blood tests. Certain medical conditions and the use of certain substances can cause symptoms similar to those associated with GAD. Your health care provider may refer you to a mental health specialist for further evaluation. TREATMENT The following therapies are usually used to treat GAD:    Medication. Antidepressant medication usually is prescribed for long-term daily control. Antianxiety medicines may be added in severe cases, especially when panic attacks occur.   Talk therapy (psychotherapy). Certain types of talk therapy can be helpful in treating GAD by providing support, education, and guidance. A form of talk therapy called cognitive behavioral therapy can teach you healthy ways to think about and react to daily life events and activities.  Stress managementtechniques. These include yoga, meditation, and exercise and can be very helpful when they are practiced regularly. A mental health specialist can help determine which treatment is best for you. Some people see improvement with one therapy. However, other people require a combination of therapies. This information is not intended to replace advice given to you by your health care provider. Make sure you discuss any questions you have with your health care provider. Document Released: 02/20/2013 Document Revised: 11/16/2014 Document Reviewed: 02/20/2013 Elsevier Interactive Patient Education  2017 Elsevier Inc.  

## 2016-10-06 NOTE — Addendum Note (Signed)
Addended by: Shelbie Ammons on: 10/06/2016 12:19 PM   Modules accepted: Orders

## 2016-10-07 ENCOUNTER — Other Ambulatory Visit: Payer: Self-pay | Admitting: Family

## 2016-10-07 LAB — CMP14+EGFR
A/G RATIO: 1.5 (ref 1.2–2.2)
ALBUMIN: 4 g/dL (ref 3.5–5.5)
ALK PHOS: 110 IU/L (ref 39–117)
ALT: 21 IU/L (ref 0–32)
AST: 14 IU/L (ref 0–40)
BILIRUBIN TOTAL: 0.2 mg/dL (ref 0.0–1.2)
BUN / CREAT RATIO: 17 (ref 9–23)
BUN: 14 mg/dL (ref 6–24)
CHLORIDE: 96 mmol/L (ref 96–106)
CO2: 30 mmol/L — ABNORMAL HIGH (ref 18–29)
Calcium: 9.7 mg/dL (ref 8.7–10.2)
Creatinine, Ser: 0.81 mg/dL (ref 0.57–1.00)
GFR calc non Af Amer: 81 mL/min/{1.73_m2} (ref >59)
GFR, EST AFRICAN AMERICAN: 93 mL/min/{1.73_m2} (ref >59)
GLOBULIN, TOTAL: 2.7 g/dL (ref 1.5–4.5)
Glucose: 70 mg/dL (ref 65–99)
Potassium: 4.1 mmol/L (ref 3.5–5.2)
SODIUM: 141 mmol/L (ref 134–144)
Total Protein: 6.7 g/dL (ref 6.0–8.5)

## 2016-10-07 LAB — LIPID PANEL
CHOLESTEROL TOTAL: 225 mg/dL — AB (ref 100–199)
Chol/HDL Ratio: 5.6 ratio units — ABNORMAL HIGH (ref 0.0–4.4)
HDL: 40 mg/dL (ref >39)
LDL Calculated: 128 mg/dL — ABNORMAL HIGH (ref 0–99)
Triglycerides: 284 mg/dL — ABNORMAL HIGH (ref 0–149)
VLDL Cholesterol Cal: 57 mg/dL — ABNORMAL HIGH (ref 5–40)

## 2016-10-26 ENCOUNTER — Ambulatory Visit: Payer: Medicare HMO | Admitting: Cardiology

## 2016-11-09 DIAGNOSIS — I517 Cardiomegaly: Secondary | ICD-10-CM

## 2016-11-09 HISTORY — DX: Cardiomegaly: I51.7

## 2016-11-13 ENCOUNTER — Other Ambulatory Visit: Payer: Self-pay | Admitting: Gastroenterology

## 2016-11-15 ENCOUNTER — Other Ambulatory Visit: Payer: Self-pay | Admitting: Gastroenterology

## 2016-11-27 ENCOUNTER — Other Ambulatory Visit: Payer: Self-pay | Admitting: Gastroenterology

## 2016-11-27 ENCOUNTER — Telehealth: Payer: Self-pay | Admitting: Gastroenterology

## 2016-11-27 NOTE — Telephone Encounter (Signed)
Pt aware prescription has been sent  

## 2016-12-08 ENCOUNTER — Encounter: Payer: Self-pay | Admitting: Cardiology

## 2016-12-08 ENCOUNTER — Ambulatory Visit (INDEPENDENT_AMBULATORY_CARE_PROVIDER_SITE_OTHER): Payer: Medicare HMO | Admitting: Cardiology

## 2016-12-08 VITALS — BP 110/76 | HR 70 | Ht 64.0 in | Wt 191.8 lb

## 2016-12-08 DIAGNOSIS — I951 Orthostatic hypotension: Secondary | ICD-10-CM | POA: Diagnosis not present

## 2016-12-08 DIAGNOSIS — R0789 Other chest pain: Secondary | ICD-10-CM | POA: Diagnosis not present

## 2016-12-08 DIAGNOSIS — R55 Syncope and collapse: Secondary | ICD-10-CM | POA: Diagnosis not present

## 2016-12-08 DIAGNOSIS — I10 Essential (primary) hypertension: Secondary | ICD-10-CM | POA: Diagnosis not present

## 2016-12-08 NOTE — Progress Notes (Signed)
Clinical Summary Ms. Ventura is a 56 y.o.female seen today for follow up of the following medical problems.   1. HTN - she remains compliant with meds  2. Syncope/Orhtostatic hypotension - prior admission with syncope in setting of dehydration and AKI. She reports poor oral intake around that time   - after discharge continued to have low bp's at home with sitting, can be in the high 60s.  - continued symptoms with position changes. Has had recurrent falls since discahrge.  - symptoms ongoing for 6-7 months.  - . Vitamin water 1-2 bottles (20 ounces). Cut out sodas. Rare tea. 1 cup coffee in AM. No EtoH.   No recurrent symptoms. Back on lisinopril/HCTZ, she restarted on her own. Tolerating.   3. Abnormal EKG/Chest pain - chest pain started about 1 year. Dull pain 3-4/10. Occurs at rest and with activity. +SOB. Can feel nausesous. Not positional. Lasts a few minutes. Occurs 2-3 times a week.  - DOE at store walking, or doing housework CAD risk factors: HTN, HL, +smoker, borderline DM2, maternal grandfather with orthostatic syncope. Thinks her biological father had heart problems.   - 07/2016 Lexiscan nuclear stress:small mild intensity partially reversible mid to apical anterior defect, suggesting variable breast attenuation vs less likely mild ischemia. LVEF 82% - no recurrent chest pain since last visit  4. HL - 09/2016 TC 225 TG 284 HDL 40 LDL 128 - started on statin a few months ago, in November had mixed compliance but now taking regularly.   Past Medical History:  Diagnosis Date  . Abdominal pain   . Anxiety   . Arthritis   . Blood in stool   . Breast changes, fibrocystic   . Bruises easily   . Clotting disorder (HCC)     itp , none since 1976, no current hematologist  . Constipation   . DDD (degenerative disc disease), cervical    with lumbar issues  . Depression   . Generalized headaches   . GERD (gastroesophageal reflux disease)   . Hemorrhoid   .  Hyperlipidemia   . Hypertension   . Idiopathic thrombocytopenia ([place])    as child  . Leg swelling    both ankles  . Liver lesion   . Nasal congestion   . Nausea & vomiting   . Neuromuscular disorder ([place])    back  injury  . Neuromuscular disorder ([place])    3 neck surgeries,neck fusion.pin,plates,screws  . Osteoporosis   . Panic attacks    pt also  has germaphobia  . Rectal bleeding   . Seizures ([place]) 1998   stress induced, one time, none since, no seizure meds  . Seizures ([place])    Stated she had 2 weeks ago,it was like a transient,stare.  . Sleep apnea    STOPBANG=4  . Spleen enlarged 1976, none since   r/t idiopathic thrombocytopenia  . Trouble swallowing   . Unintentional weight loss   . Weakness    weakness varies     Allergies  Allergen Reactions  . Nsaids Nausea Only    Abdominal pain     Current Outpatient Prescriptions  Medication Sig Dispense Refill  . ALPRAZolam (XANAX) 0.5 MG tablet Take 1 tablet (0.5 mg total) by mouth 2 (two) times daily as needed. 60 tablet 3  . aspirin EC 81 MG tablet Take 1 tablet (81 mg total) by mouth daily.    Marland Kitchen atenolol (TENORMIN) 50 MG tablet Take 1 Tablet by mouth once daily WITH BREAKFAST  90 tablet 0  . atorvastatin (LIPITOR) 20 MG tablet Take 1 tablet (20 mg total) by mouth daily. 90 tablet 3  . citalopram (CELEXA) 40 MG tablet Take 1 tablet (40 mg total) by mouth daily with breakfast. 90 tablet 1  . gabapentin (NEURONTIN) 300 MG capsule Take 600 mg by mouth 3 (three) times daily.     Marland Kitchen HYDROcodone-acetaminophen (NORCO) 10-325 MG tablet Take 1 tablet by mouth 4 (four) times daily as needed.    Marland Kitchen lisinopril (PRINIVIL,ZESTRIL) 20 MG tablet Take 1 tablet (20 mg total) by mouth daily. 30 tablet 6  . lisinopril-hydrochlorothiazide (PRINZIDE,ZESTORETIC) 20-25 MG tablet Alternates with Lisinopril    . metoCLOPramide (REGLAN) 10 MG tablet TAKE ONE TABLET BY MOUTH ONCE DAILY AT BEDTIME 30 tablet 0  . ondansetron (ZOFRAN) 4 MG tablet  Take 1 tablet by mouth daily as needed for nausea or vomiting.     . pantoprazole (PROTONIX) 40 MG tablet TAKE ONE TABLET BY MOUTH TWICE DAILY 180 tablet 3  . traZODone (DESYREL) 100 MG tablet Take 1 tablet (100 mg total) by mouth at bedtime. 90 tablet 1  . Vitamin D, Ergocalciferol, (DRISDOL) 50000 units CAPS capsule Take 1 capsule (50,000 Units total) by mouth every 7 (seven) days. 12 capsule 3  . VOLTAREN 1 % GEL Apply 2 g topically 4 (four) times daily as needed (for pain).      No current facility-administered medications for this visit.      Past Surgical History:  Procedure Laterality Date  . ABDOMINAL HYSTERECTOMY  1997  . CERVICAL SPINE SURGERY  2005, 2007, 2010   x3, fusion done,plates and screws present  . [place]  . CHOLECYSTECTOMY  03/14/2012   Procedure: LAPAROSCOPIC CHOLECYSTECTOMY;  Surgeon: Stark Klein, MD;  Location: [facility];  Service: General;  Laterality: N/A;  . colonscopy and esophagogastrodudononescopy  02-04-12  . LUMBAR DISC SURGERY  11/2014     Allergies  Allergen Reactions  . Nsaids Nausea Only    Abdominal pain      Family History  Problem Relation Age of Onset  . Hypertension Mother   . Heart disease Mother   . Lung cancer Father   . Cancer Father     lung  . Colon cancer Paternal Grandmother   . Cancer Paternal Grandmother     colon  . Heart disease Maternal Grandmother   . Diabetes Sister      Social History Ms. Bramblett reports that she has been smoking Cigarettes.  She started smoking about 6 years ago. She has a 5.00 pack-year smoking history. She has never used smokeless tobacco. Ms. Brace reports that she does not drink alcohol.   Review of Systems CONSTITUTIONAL: No weight loss, fever, chills, weakness or fatigue.  HEENT: Eyes: No visual loss, blurred vision, double vision or yellow sclerae.No hearing loss, sneezing, congestion, runny nose or sore throat.  SKIN: No rash or itching.  CARDIOVASCULAR: per  HPI RESPIRATORY: No shortness of breath, cough or sputum.  GASTROINTESTINAL: No anorexia, nausea, vomiting or diarrhea. No abdominal pain or blood.  GENITOURINARY: No burning on urination, no polyuria NEUROLOGICAL: No headache, dizziness, syncope, paralysis, ataxia, numbness or tingling in the extremities. No change in bowel or bladder control.  MUSCULOSKELETAL: No muscle, back pain, joint pain or stiffness.  LYMPHATICS: No enlarged nodes. No history of splenectomy.  PSYCHIATRIC: No history of depression or anxiety.  ENDOCRINOLOGIC: No reports of sweating, cold or heat intolerance. No polyuria or polydipsia.  Marland Kitchen  Physical Examination Vitals:   12/08/16 1120  BP: 110/76  Pulse: 70   Vitals:   12/08/16 1120  Weight: 191 lb 12.8 oz (87 kg)  Height: 5\' 4"  (1.626 m)    Gen: resting comfortably, no acute distress HEENT: no scleral icterus, pupils equal round and reactive, no palptable cervical adenopathy,  CV: RRR, no m/r/g, no jvd Resp: Clear to auscultation bilaterally GI: abdomen is soft, non-tender, non-distended, normal bowel sounds, no hepatosplenomegaly MSK: extremities are warm, no edema.  Skin: warm, no rash Neuro:  no focal deficits Psych: appropriate affect   Diagnostic Studies 07/2016 echo Study Conclusions  - Left ventricle: The cavity size was normal. Wall thickness was increased in a pattern of mild LVH. Systolic function was vigorous. The estimated ejection fraction was in the range of 65% to 70%. Left ventricular diastolic function parameters were normal. - Mitral valve: There was mild regurgitation. - Pericardium, extracardiac: A trivial pericardial effusion was identified.   07/2016 MPI  No diagnostic ST segment changes with Lexiscan infusion. Patient did develop chest pain and shortness of breath 10-15 minutes after the completion of the study. This was not associated with any acute ST segment changes by repeat ECG, and blood pressure was  stable. Symptoms resolved spontaneously.  Small, mild intensity, partially reversible mid to apical anterior defect suggestive of variable breast attenuation artifact, less likely mild region of ischemia.  This is a low risk study.  Nuclear stress EF: 82%.  Assessment and Plan  1. Orthostatic hypotension/syncope -no recurrent symptoms. Previously was orthostatic in clinic - we had stopped her diuretic but she states her LE edema worsened and she restarted on her own. Currently tolerating without recurrence of orthostatic symptoms - continue to moniotor.   2. Chest pain - negative stress test 07/2016 - no recent symptoms, continue to monitor.   3. HTN - at goal, continue current meds   F/u 1 year      Arnoldo Lenis, M.D., F.A.C.C.

## 2016-12-08 NOTE — Patient Instructions (Signed)

## 2016-12-13 ENCOUNTER — Other Ambulatory Visit: Payer: Self-pay | Admitting: Gastroenterology

## 2017-01-04 ENCOUNTER — Telehealth: Payer: Self-pay | Admitting: Gastroenterology

## 2017-01-04 MED ORDER — METOCLOPRAMIDE HCL 10 MG PO TABS: 10.0000 mg | ORAL_TABLET | Freq: Every day | ORAL | 2 refills | Status: DC

## 2017-01-04 NOTE — Telephone Encounter (Signed)
The pt is aware that she needs to keep scheduled f/u for further refills

## 2017-01-13 ENCOUNTER — Other Ambulatory Visit: Payer: Self-pay | Admitting: Family

## 2017-02-08 ENCOUNTER — Other Ambulatory Visit: Payer: Self-pay | Admitting: Family

## 2017-02-08 DIAGNOSIS — I10 Essential (primary) hypertension: Secondary | ICD-10-CM

## 2017-02-10 ENCOUNTER — Other Ambulatory Visit: Payer: Self-pay | Admitting: Family

## 2017-02-10 NOTE — Telephone Encounter (Signed)
Please forward to Vander, she can do this refill tomorrow when she is back

## 2017-02-11 NOTE — Telephone Encounter (Signed)
Xanax refill called to [facility] [employer] voice mail with message will need to be seen before more refills.

## 2017-03-01 ENCOUNTER — Ambulatory Visit: Payer: Medicare HMO | Admitting: Gastroenterology

## 2017-03-05 ENCOUNTER — Encounter (HOSPITAL_COMMUNITY): Payer: Self-pay

## 2017-03-05 ENCOUNTER — Telehealth: Payer: Self-pay

## 2017-03-05 ENCOUNTER — Ambulatory Visit (INDEPENDENT_AMBULATORY_CARE_PROVIDER_SITE_OTHER): Payer: Medicare PPO | Admitting: Family

## 2017-03-05 ENCOUNTER — Emergency Department (HOSPITAL_COMMUNITY)
Admission: EM | Admit: 2017-03-05 | Discharge: 2017-03-05 | Disposition: A | Discharge status: Home / Self Care | Payer: Medicare PPO | Attending: Emergency Medicine | Admitting: Emergency Medicine

## 2017-03-05 ENCOUNTER — Encounter: Payer: Self-pay | Admitting: Family

## 2017-03-05 VITALS — BP 85/58 | HR 77 | Temp 97.7°F | Ht 64.0 in | Wt 192.6 lb

## 2017-03-05 DIAGNOSIS — I959 Hypotension, unspecified: Secondary | ICD-10-CM

## 2017-03-05 DIAGNOSIS — F17210 Nicotine dependence, cigarettes, uncomplicated: Secondary | ICD-10-CM | POA: Diagnosis not present

## 2017-03-05 DIAGNOSIS — R531 Weakness: Secondary | ICD-10-CM | POA: Diagnosis present

## 2017-03-05 DIAGNOSIS — Z79899 Other long term (current) drug therapy: Secondary | ICD-10-CM | POA: Diagnosis not present

## 2017-03-05 DIAGNOSIS — N3000 Acute cystitis without hematuria: Secondary | ICD-10-CM | POA: Diagnosis not present

## 2017-03-05 DIAGNOSIS — R3989 Other symptoms and signs involving the genitourinary system: Secondary | ICD-10-CM | POA: Diagnosis not present

## 2017-03-05 DIAGNOSIS — I10 Essential (primary) hypertension: Secondary | ICD-10-CM | POA: Insufficient documentation

## 2017-03-05 DIAGNOSIS — E860 Dehydration: Secondary | ICD-10-CM | POA: Diagnosis not present

## 2017-03-05 LAB — URINALYSIS, COMPLETE
Bilirubin, UA: NEGATIVE
Glucose, UA: NEGATIVE
Ketones, UA: NEGATIVE
Nitrite, UA: NEGATIVE
PH UA: 7 (ref 5.0–7.5)
RBC, UA: NEGATIVE
Specific Gravity, UA: 1.02 (ref 1.005–1.030)
Urobilinogen, Ur: 1 mg/dL (ref 0.2–1.0)

## 2017-03-05 LAB — COMPREHENSIVE METABOLIC PANEL
ALBUMIN: 3.1 g/dL — AB (ref 3.5–5.0)
ALT: 24 U/L (ref 14–54)
AST: 20 U/L (ref 15–41)
Alkaline Phosphatase: 84 U/L (ref 38–126)
Anion gap: 10 (ref 5–15)
BUN: 19 mg/dL (ref 6–20)
CHLORIDE: 101 mmol/L (ref 101–111)
CO2: 26 mmol/L (ref 22–32)
Calcium: 8.4 mg/dL — ABNORMAL LOW (ref 8.9–10.3)
Creatinine, Ser: 0.96 mg/dL (ref 0.44–1.00)
GFR calc Af Amer: >60 mL/min (ref >60)
GFR calc non Af Amer: >60 mL/min (ref >60)
GLUCOSE: 147 mg/dL — AB (ref 65–99)
POTASSIUM: 3.2 mmol/L — AB (ref 3.5–5.1)
Sodium: 137 mmol/L (ref 135–145)
Total Bilirubin: 0.4 mg/dL (ref 0.3–1.2)
Total Protein: 6.1 g/dL — ABNORMAL LOW (ref 6.5–8.1)

## 2017-03-05 LAB — CBC WITH DIFFERENTIAL/PLATELET
BASOS ABS: 0 10*3/uL (ref 0.0–0.1)
BASOS PCT: 0 %
EOS PCT: 4 %
Eosinophils Absolute: 0.6 10*3/uL (ref 0.0–0.7)
HEMATOCRIT: 41 % (ref 36.0–46.0)
Hemoglobin: 13.6 g/dL (ref 12.0–15.0)
Lymphocytes Relative: 33 %
Lymphs Abs: 4.5 10*3/uL — ABNORMAL HIGH (ref 0.7–4.0)
MCH: 29.6 pg (ref 26.0–34.0)
MCHC: 33.2 g/dL (ref 30.0–36.0)
MCV: 89.1 fL (ref 78.0–100.0)
Monocytes Absolute: 1.1 10*3/uL — ABNORMAL HIGH (ref 0.1–1.0)
Monocytes Relative: 8 %
NEUTROS ABS: 7.5 10*3/uL (ref 1.7–7.7)
Neutrophils Relative %: 55 %
PLATELETS: 284 10*3/uL (ref 150–400)
RBC: 4.6 MIL/uL (ref 3.87–5.11)
RDW: 13.9 % (ref 11.5–15.5)
WBC: 13.7 10*3/uL — ABNORMAL HIGH (ref 4.0–10.5)

## 2017-03-05 LAB — URINALYSIS, ROUTINE W REFLEX MICROSCOPIC
Bilirubin Urine: NEGATIVE
GLUCOSE, UA: NEGATIVE mg/dL
Hgb urine dipstick: NEGATIVE
KETONES UR: NEGATIVE mg/dL
Leukocytes, UA: NEGATIVE
Nitrite: NEGATIVE
PROTEIN: NEGATIVE mg/dL
Specific Gravity, Urine: 1.005 (ref 1.005–1.030)
pH: 5 (ref 5.0–8.0)

## 2017-03-05 LAB — MICROSCOPIC EXAMINATION

## 2017-03-05 LAB — TROPONIN I: Troponin I: <0.03 ng/mL (ref <0.03)

## 2017-03-05 LAB — LACTIC ACID, PLASMA: LACTIC ACID, VENOUS: 1.8 mmol/L (ref 0.5–1.9)

## 2017-03-05 MED ORDER — POTASSIUM CHLORIDE CRYS ER 20 MEQ PO TBCR
40.0000 meq | EXTENDED_RELEASE_TABLET | Freq: Once | ORAL | Status: AC
  Administered 2017-03-05: 40 meq via ORAL
  Filled 2017-03-05: qty 2

## 2017-03-05 MED ORDER — SODIUM CHLORIDE 0.9 % IV BOLUS (SEPSIS)
2000.0000 mL | Freq: Once | INTRAVENOUS | Status: AC
Start: 2017-03-05 — End: 2017-03-05
  Administered 2017-03-05: 2000 mL via INTRAVENOUS

## 2017-03-05 MED ORDER — NITROFURANTOIN MONOHYD MACRO 100 MG PO CAPS: 100.0000 mg | ORAL_CAPSULE | Freq: Two times a day (BID) | ORAL | 0 refills | Status: DC

## 2017-03-05 NOTE — ED Triage Notes (Signed)
Pt states she has had problems with low blood pressure in the past, states she saw her pmd today and diagnosed with uti.  Pt had one dose of macrobid today.  Pt states she gets dizzy easily

## 2017-03-05 NOTE — Patient Instructions (Signed)
Urinary Frequency, Adult Urinary frequency means urinating more often than usual. People with urinary frequency urinate at least 8 times in 24 hours, even if they drink a normal amount of fluid. Although they urinate more often than normal, the total amount of urine produced in a day may be normal. Urinary frequency is also called pollakiuria. What are the causes? This condition may be caused by:  A urinary tract infection.  Obesity.  Bladder problems, such as bladder stones.  Caffeine or alcohol.  Eating food or drinking fluids that irritate the bladder. These include coffee, tea, soda, artificial sweeteners, citrus, tomato-based foods, and chocolate.  Certain medicines, such as medicines that help the body get rid of extra fluid (diuretics).  Muscle or nerve weakness.  Overactive bladder.  Chronic diabetes.  Interstitial cystitis.  In men, problems with the prostate, such as an enlarged prostate.  In women, pregnancy. In some cases, the cause may not be known. What increases the risk? This condition is more likely to develop in:  Women who have gone through menopause.  Men with prostate problems.  People with a disease or injury that affects the nerves or spinal cord.  People who have or have had a condition that affects the brain, such as a stroke. What are the signs or symptoms? Symptoms of this condition include:  Feeling an urgent need to urinate often. The stress and anxiety of needing to find a bathroom quickly can make this urge worse.  Urinating 8 or more times in 24 hours.  Urinating as often as every 1 to 2 hours. How is this diagnosed? This condition is diagnosed based on your symptoms, your medical history, and a physical exam. You may have tests, such as:  Blood tests.  Urine tests.  Imaging tests, such as X-rays or ultrasounds.  A bladder test.  A test of your neurological system. This is the body system that senses the need to urinate.  A  test to check for problems in the urethra and bladder called cystoscopy. You may also be asked to keep a bladder diary. A bladder diary is a record of what you eat and drink, how often you urinate, and how much you urinate. You may need to see a health care provider who specializes in conditions of the urinary tract (urologist) or kidneys (nephrologist). How is this treated? Treatment for this condition depends on the cause. Sometimes the condition goes away on its own and treatment is not necessary. If treatment is needed, it may include:  Taking medicine.  Learning exercises that strengthen the muscles that help control urination.  Following a bladder training program. This may include:  Learning to delay going to the bathroom.  Double urinating (voiding). This helps if you are not completely emptying your bladder.  Scheduled voiding.  Making diet changes, such as:  Avoiding caffeine.  Drinking fewer fluids, especially alcohol.  Not drinking in the evening.  Not having foods or drinks that may irritate the bladder.  Eating foods that help prevent or ease constipation. Constipation can make this condition worse.  Having the nerves in your bladder stimulated. There are two options for stimulating the nerves to your bladder:  Outpatient electrical nerve stimulation. This is done by your health care provider.  Surgery to implant a bladder pacemaker. The pacemaker helps to control the urge to urinate. Follow these instructions at home:  Keep a bladder diary if told to by your health care provider.  Take over-the-counter and prescription medicines only as   told by your health care provider.  Do any exercises as told by your health care provider.  Follow a bladder training program as told by your health care provider.  Make any recommended diet changes.  Keep all follow-up visits as told by your health care provider. This is important. Contact a health care provider  if:  You start urinating more often.  You feel pain or irritation when you urinate.  You notice blood in your urine.  Your urine looks cloudy.  You develop a fever.  You begin vomiting. Get help right away if:  You are unable to urinate. This information is not intended to replace advice given to you by your health care provider. Make sure you discuss any questions you have with your health care provider. Document Released: 08/22/2009 Document Revised: 11/27/2015 Document Reviewed: 05/22/2015 Elsevier Interactive Patient Education  2017 [employer]vier Inc.  

## 2017-03-05 NOTE — ED Notes (Signed)
IV placed by EMS 20 gauge left hand - IV removed cath intact

## 2017-03-05 NOTE — ED Notes (Signed)
Pt ambulatory to bathroom and back to room with this nurse at side- normal gait noted.

## 2017-03-05 NOTE — Discharge Instructions (Signed)
Stop taking your blood pressure medicine. Drink plenty of fluids. Follow-up with your doctor next week

## 2017-03-05 NOTE — ED Notes (Signed)
Pt ambulatory to bathroom and back to room- no problems noted.

## 2017-03-05 NOTE — Progress Notes (Signed)
   Subjective:    Patient ID: [name], female    DOB: 1961-09-24, 56 y.o.   MRN: [id number]  Dysuria   The current episode started 1 to 4 weeks ago. The problem occurs every urination. The problem has been gradually worsening. The quality of the pain is described as burning. The pain is at a severity of 7/10. The pain is mild. Associated symptoms include flank pain, frequency, hesitancy and urgency. Pertinent negatives include no hematuria. She has tried acetaminophen and increased fluids for the symptoms. The treatment provided mild relief.      Review of Systems  Genitourinary: Positive for dysuria, flank pain, frequency, hesitancy and urgency. Negative for hematuria.  All other systems reviewed and are negative.      Objective:   Physical Exam  Constitutional: She is oriented to person, place, and time. She appears well-developed and well-nourished. No distress.  HENT:  Head: Normocephalic.  Eyes: Pupils are equal, round, and reactive to light.  Neck: Normal range of motion. Neck supple. No thyromegaly present.  Cardiovascular: Normal rate, regular rhythm, normal heart sounds and intact distal pulses.   No murmur heard. Pulmonary/Chest: Effort normal and breath sounds normal. No respiratory distress. She has no wheezes.  Abdominal: Soft. Bowel sounds are normal. She exhibits no distension. There is tenderness (lower abd tenderness).  Musculoskeletal: Normal range of motion. She exhibits no edema or tenderness.  Neurological: She is alert and oriented to person, place, and time.  Skin: Skin is warm and dry.  Psychiatric: She has a normal mood and affect. Her behavior is normal. Judgment and thought content normal.  Vitals reviewed.   BP (!) 85/61   Pulse 80   Temp 97.7 F (36.5 C) (Oral)   Ht 5\' 4"  (1.626 m)   Wt 192 lb 9.6 oz (87.4 kg)   BMI 33.06 kg/m       Assessment & Plan:  1. Urine troubles - Urinalysis, Complete  2. Hypotension, unspecified  hypotension type -Lisinopril- HCTZ stopped today Force fluids RTO tomorrow to have BP rechecked  3. Acute cystitis without hematuria Force fluids AZO over the counter X2 days RTO prn Culture pending - Urine culture - nitrofurantoin, macrocrystal-monohydrate, (MACROBID) 100 MG capsule; Take 1 capsule (100 mg total) by mouth 2 (two) times daily.  Dispense: 10 capsule; Refill: 0   Evelina Dun, FNP

## 2017-03-05 NOTE — Telephone Encounter (Signed)
Patient called stating that she was seen today and her BP was low. She states that she has been checking her BP every 15-20 minutes and it is 80/45 at this time. Patient states that she can't get enough to drink and feels very weak. I advised patient to go to ER for evaluation and possible fluids. Patient verbalized understanding and stated that she would go to [facility] ER.

## 2017-03-05 NOTE — ED Provider Notes (Signed)
[facility] DEPT Provider Note   CSN: [id number] Arrival date & time: 03/05/17  1944     History   Chief Complaint Chief Complaint  Patient presents with  . Hypotension    HPI [name] is a 56 y.o. female.  Patient complains of weakness. She was seen in the office today with low blood pressure and urinary tract infection.   The history is provided by the patient. No language interpreter was used.  Weakness  Primary symptoms include no focal weakness. This is a recurrent problem. The current episode started 12 to 24 hours ago. The problem has not changed since onset.There was no focality noted. There has been no fever. Pertinent negatives include no shortness of breath, no chest pain and no headaches.    Past Medical History:  Diagnosis Date  . Abdominal pain   . Anxiety   . Arthritis   . Blood in stool   . Breast changes, fibrocystic   . Bruises easily   . Clotting disorder (HCC)     itp , none since 1976, no current hematologist  . Constipation   . DDD (degenerative disc disease), cervical    with lumbar issues  . Depression   . Generalized headaches   . GERD (gastroesophageal reflux disease)   . Hemorrhoid   . Hyperlipidemia   . Hypertension   . Idiopathic thrombocytopenia ([place])    as child  . Leg swelling    both ankles  . Liver lesion   . Nasal congestion   . Nausea & vomiting   . Neuromuscular disorder ([place])    back  injury  . Neuromuscular disorder ([place])    3 neck surgeries,neck fusion.pin,plates,screws  . Osteoporosis   . Panic attacks    pt also  has germaphobia  . Rectal bleeding   . Seizures ([place]) 1998   stress induced, one time, none since, no seizure meds  . Seizures ([place])    Stated she had 2 weeks ago,it was like a transient,stare.  . Sleep apnea    STOPBANG=4  . Spleen enlarged 1976, none since   r/t idiopathic thrombocytopenia  . Trouble swallowing   . Unintentional weight loss   . Weakness    weakness varies     Patient Active Problem List   Diagnosis Date Noted  . Dehydration 07/10/2016  . AKI (acute kidney injury) ([place]) 07/09/2016  . Hypokalemia 07/09/2016  . Obesity (BMI 30-39.9) 07/02/2016  . Osteopenia 12/20/2013  . Diverticulosis of colon (without mention of hemorrhage) 02/04/2012  . GERD (gastroesophageal reflux disease) 02/04/2012  . Dysphagia 02/04/2012  . Unspecified gastritis and gastroduodenitis without mention of hemorrhage 02/04/2012  . Cystic liver mass 01/26/2012  . Cholecystitis chronic 01/26/2012  . SYNCOPE 12/20/2007  . GAD (generalized anxiety disorder) 01/25/2007  . Depression 01/25/2007  . Essential hypertension 01/25/2007  . BREAST MASSES, BILATERAL 01/25/2007  . DEGENERATIVE DISC DISEASE, CERVICAL SPINE 01/25/2007  . LOW BACK PAIN 01/25/2007  . SEIZURE DISORDER 01/25/2007  . Insomnia 01/25/2007  . DEPENDENT EDEMA, LEGS, BILATERAL 01/25/2007  . Current smoker 01/25/2007  . TAH/BSO, HX OF 01/25/2007    Past Surgical History:  Procedure Laterality Date  . ABDOMINAL HYSTERECTOMY  1997  . CERVICAL SPINE SURGERY  2005, 2007, 2010   x3, fusion done,plates and screws present  . [place]  . CHOLECYSTECTOMY  03/14/2012   Procedure: LAPAROSCOPIC CHOLECYSTECTOMY;  Surgeon: Stark Klein, MD;  Location: [facility] ORS;  Service: General;  Laterality: N/A;  . colonscopy  and esophagogastrodudononescopy  02-04-12  . LUMBAR DISC SURGERY  11/2014    OB History    No data available       Home Medications    Prior to Admission medications   Medication Sig Start Date End Date Taking? Authorizing Provider  ALPRAZolam (XANAX) 0.5 MG tablet TAKE ONE TABLET BY MOUTH TWICE DAILY AS NEEDED Patient taking differently: TAKE ONE TABLET BY MOUTH TWICE DAILY AS NEEDED FOR ANXIETY 02/11/17  Yes Sharion Balloon, FNP  atenolol (TENORMIN) 50 MG tablet TAKE ONE TABLET BY MOUTH ONCE DAILY WITH BREAKFAST 01/13/17  Yes Sharion Balloon, FNP  atorvastatin (LIPITOR) 20 MG tablet  Take 1 tablet (20 mg total) by mouth daily. 07/17/16  Yes Timmothy Euler, MD  citalopram (CELEXA) 40 MG tablet Take 1 tablet (40 mg total) by mouth daily with breakfast. 10/06/16  Yes Sharion Balloon, FNP  diphenhydrAMINE (BENADRYL) 25 mg capsule Take 25 mg by mouth daily as needed for allergies.   Yes Historical Provider, MD  gabapentin (NEURONTIN) 300 MG capsule Take 600 mg by mouth 3 (three) times daily.  09/28/13  Yes Historical Provider, MD  HYDROcodone-acetaminophen (NORCO) 10-325 MG tablet Take 1 tablet by mouth every 6 (six) hours as needed for moderate pain or severe pain.  07/16/16  Yes Historical Provider, MD  lisinopril-hydrochlorothiazide (PRINZIDE,ZESTORETIC) 20-25 MG tablet Take 1 tablet by mouth daily.  02/08/17  Yes Historical Provider, MD  metoCLOPramide (REGLAN) 10 MG tablet Take 1 tablet (10 mg total) by mouth daily with breakfast. 01/04/17  Yes Milus Banister, MD  nitrofurantoin, macrocrystal-monohydrate, (MACROBID) 100 MG capsule Take 1 capsule (100 mg total) by mouth 2 (two) times daily. 03/05/17  Yes Sharion Balloon, FNP  ondansetron (ZOFRAN) 4 MG tablet Take 1 tablet by mouth daily as needed for nausea or vomiting.  04/02/16  Yes Historical Provider, MD  pantoprazole (PROTONIX) 40 MG tablet TAKE ONE TABLET BY MOUTH TWICE DAILY 11/27/16  Yes Milus Banister, MD  traZODone (DESYREL) 100 MG tablet Take 1 tablet (100 mg total) by mouth at bedtime. 10/06/16  Yes Sharion Balloon, FNP  Vitamin D, Ergocalciferol, (DRISDOL) 50000 units CAPS capsule Take 1 capsule (50,000 Units total) by mouth every 7 (seven) days. 07/03/16  Yes Sharion Balloon, FNP    Family History Family History  Problem Relation Age of Onset  . Hypertension Mother   . Heart disease Mother   . Lung cancer Father   . Cancer Father     lung  . Colon cancer Paternal Grandmother   . Cancer Paternal Grandmother     colon  . Heart disease Maternal Grandmother   . Diabetes Sister     Social History Social History   Substance Use Topics  . Smoking status: Current Every Day Smoker    Packs/day: 1.00    Years: 5.00    Types: Cigarettes    Start date: 12/20/2009  . Smokeless tobacco: Never Used     Comment: VAPOR- OCCASIONALLY  . Alcohol use No     Allergies   Nsaids   Review of Systems Review of Systems  Constitutional: Negative for appetite change and fatigue.  HENT: Negative for congestion, ear discharge and sinus pressure.   Eyes: Negative for discharge.  Respiratory: Negative for cough and shortness of breath.   Cardiovascular: Negative for chest pain.  Gastrointestinal: Negative for abdominal pain and diarrhea.  Genitourinary: Negative for frequency and hematuria.  Musculoskeletal: Negative for back pain.  Skin: Negative for  rash.  Neurological: Positive for weakness. Negative for focal weakness, seizures and headaches.  Psychiatric/Behavioral: Negative for hallucinations.     Physical Exam Updated Vital Signs BP 131/82   Pulse 65   Temp 98.2 F (36.8 C) (Oral)   Resp 17   Ht 5\' 4"  (1.626 m)   Wt 190 lb (86.2 kg)   SpO2 98%   BMI 32.61 kg/m   Physical Exam  Constitutional: She is oriented to person, place, and time. She appears well-developed.  HENT:  Head: Normocephalic.  Eyes: Conjunctivae and EOM are normal. No scleral icterus.  Neck: Neck supple. No thyromegaly present.  Cardiovascular: Normal rate and regular rhythm.  Exam reveals no gallop and no friction rub.   No murmur heard. Pulmonary/Chest: No stridor. She has no wheezes. She has no rales. She exhibits no tenderness.  Abdominal: She exhibits no distension. There is no tenderness. There is no rebound.  Musculoskeletal: Normal range of motion. She exhibits no edema.  Lymphadenopathy:    She has no cervical adenopathy.  Neurological: She is oriented to person, place, and time. She exhibits normal muscle tone. Coordination normal.  Skin: No rash noted. No erythema.  Psychiatric: She has a normal mood and  affect. Her behavior is normal.     ED Treatments / Results  Labs (all labs ordered are listed, but only abnormal results are displayed) Labs Reviewed  URINALYSIS, ROUTINE W REFLEX MICROSCOPIC - Abnormal; Notable for the following:       Result Value   Color, Urine STRAW (*)    All other components within normal limits  CBC WITH DIFFERENTIAL/PLATELET - Abnormal; Notable for the following:    WBC 13.7 (*)    Lymphs Abs 4.5 (*)    Monocytes Absolute 1.1 (*)    All other components within normal limits  COMPREHENSIVE METABOLIC PANEL - Abnormal; Notable for the following:    Potassium 3.2 (*)    Glucose, Bld 147 (*)    Calcium 8.4 (*)    Total Protein 6.1 (*)    Albumin 3.1 (*)    All other components within normal limits  LACTIC ACID, PLASMA  TROPONIN I  CBC WITH DIFFERENTIAL/PLATELET    EKG  EKG Interpretation  Date/Time:  Friday March 05 2017 20:09:33 EDT Ventricular Rate:  69 PR Interval:    QRS Duration: 86 QT Interval:  419 QTC Calculation: 449 R Axis:   52 Text Interpretation:  Sinus rhythm Abnormal R-wave progression, early transition Borderline T abnormalities, anterior leads Confirmed by Jaclyn Carew  MD, Temika Sutphin ([id number]) on 03/05/2017 10:52:21 PM       Radiology No results found.  Procedures Procedures (including critical care time)  Medications Ordered in ED Medications  sodium chloride 0.9 % bolus 2,000 mL (0 mLs Intravenous Stopped 03/05/17 2128)  potassium chloride SA (K-DUR,KLOR-CON) CR tablet 40 mEq (40 mEq Oral Given 03/05/17 2257)     Initial Impression / Assessment and Plan / ED Course  I have reviewed the triage vital signs and the nursing notes.  Pertinent labs & imaging results that were available during my care of the patient were reviewed by me and considered in my medical decision making (see chart for details).     Labs were unremarkable. Patient's blood pressure became normal with IV fluids. Suspect dehydration. Patient will continue taking  her antibiotic for her UTI although the urine here look clear. Her primary care doctor did culture the urine. She will stop her blood pressure medicine and follow-up with her doctor  next week  Final Clinical Impressions(s) / ED Diagnoses   Final diagnoses:  Dehydration    New Prescriptions New Prescriptions   No medications on file     Milton Ferguson, MD 03/05/17 2320

## 2017-03-06 ENCOUNTER — Telehealth: Payer: Self-pay | Admitting: Family Medicine

## 2017-03-07 LAB — URINE CULTURE

## 2017-03-08 ENCOUNTER — Telehealth: Payer: Self-pay | Admitting: Family Medicine

## 2017-03-08 NOTE — Telephone Encounter (Signed)
Pt will need to follow up in one week to recheck BP in office and lab work

## 2017-03-08 NOTE — Telephone Encounter (Signed)
Pt aware of recommendations

## 2017-03-08 NOTE — Telephone Encounter (Signed)
scheduled

## 2017-03-09 ENCOUNTER — Ambulatory Visit (INDEPENDENT_AMBULATORY_CARE_PROVIDER_SITE_OTHER): Payer: Medicare PPO | Admitting: Family

## 2017-03-09 ENCOUNTER — Encounter: Payer: Self-pay | Admitting: Family

## 2017-03-09 VITALS — BP 162/106 | HR 62 | Temp 98.4°F | Ht 64.0 in | Wt 198.4 lb

## 2017-03-09 DIAGNOSIS — Z09 Encounter for follow-up examination after completed treatment for conditions other than malignant neoplasm: Secondary | ICD-10-CM

## 2017-03-09 DIAGNOSIS — L0291 Cutaneous abscess, unspecified: Secondary | ICD-10-CM

## 2017-03-09 DIAGNOSIS — I952 Hypotension due to drugs: Secondary | ICD-10-CM

## 2017-03-09 DIAGNOSIS — I10 Essential (primary) hypertension: Secondary | ICD-10-CM | POA: Diagnosis not present

## 2017-03-09 MED ORDER — LISINOPRIL 10 MG PO TABS: 10.0000 mg | ORAL_TABLET | Freq: Every day | ORAL | 3 refills | Status: DC

## 2017-03-09 MED ORDER — SULFAMETHOXAZOLE-TRIMETHOPRIM 800-160 MG PO TABS: 1.0000 | ORAL_TABLET | Freq: Two times a day (BID) | ORAL | 0 refills | Status: DC

## 2017-03-09 NOTE — Progress Notes (Signed)
   Subjective:    Patient ID: [name], female    DOB: Jan 07, 1961, 56 y.o.   MRN: [id number]  Pt presents to the office today for hospital follow up. Pt was seen in the office on 03/05/17 and diagnosed with UTI and was told to stop her lisinopril/HCTZ related to hypotension. PT went to the ED on 03/05/17 and diagnosed with dehydration. PT was given IV fluids and was discharged. Pt's BP today is elevated.   PT is also complaining of an abscess that she noticed couple of days that has become worse. PT reports a constant aching pain of 10 out 10.  Hypertension  This is a recurrent problem. The problem has been waxing and waning since onset. The problem is uncontrolled. Associated symptoms include malaise/fatigue. Pertinent negatives include no peripheral edema or shortness of breath. Past treatments include beta blockers. The current treatment provides mild improvement.      Review of Systems  Constitutional: Positive for malaise/fatigue.  Respiratory: Negative for shortness of breath.   All other systems reviewed and are negative.      Objective:   Physical Exam  Constitutional: She is oriented to person, place, and time. She appears well-developed and well-nourished. No distress.  HENT:  Head: Normocephalic.  Eyes: Pupils are equal, round, and reactive to light.  Neck: Normal range of motion. Neck supple. No thyromegaly present.  Cardiovascular: Normal rate, regular rhythm, normal heart sounds and intact distal pulses.   No murmur heard. Pulmonary/Chest: Effort normal and breath sounds normal. No respiratory distress. She has no wheezes.  Abdominal: Soft. Bowel sounds are normal. She exhibits no distension. There is no tenderness.  Genitourinary:     Musculoskeletal: Normal range of motion. She exhibits no edema or tenderness.  Neurological: She is alert and oriented to person, place, and time. No cranial nerve deficit.  Skin: Skin is warm and dry.  Abscess present on  right outer labia, hard, tender, and erythemas  Psychiatric: She has a normal mood and affect. Her behavior is normal. Judgment and thought content normal.  Vitals reviewed.    BP (!) 162/106   Pulse 62   Temp 98.4 F (36.9 C) (Oral)   Ht _0  (1.626 m)   Wt 198 lb 6.4 oz (90 kg)   BMI 34.06 kg/m      Assessment & Plan:  1. Essential hypertension -Will start lisinopril 10 mg today -Dash diet information given -Exercise encouraged - Stress Management  -Continue current meds -RTO in 2 weeks  - lisinopril (PRINIVIL,ZESTRIL) 10 MG tablet; Take 1 tablet (10 mg total) by mouth daily.  Dispense: 90 tablet; Refill: 3 - BMP8+EGFR  2. Hypotension due to drugs -Resolved - BMP8+EGFR  3. Abscess -Warm compresses  - BMP8+EGFR - sulfamethoxazole-trimethoprim (BACTRIM DS) 800-160 MG tablet; Take 1 tablet by mouth 2 (two) times daily.  Dispense: 14 tablet; Refill: 0  4. Hospital discharge follow-up - BMP8+EGFR   Continue all meds Labs pending Health Maintenance reviewed Diet and exercise encouraged RTO 2 weeks to recheck HTN  Evelina Dun, FNP

## 2017-03-09 NOTE — Patient Instructions (Signed)

## 2017-03-10 LAB — BMP8+EGFR
BUN/Creatinine Ratio: 17 (ref 9–23)
BUN: 11 mg/dL (ref 6–24)
CHLORIDE: 98 mmol/L (ref 96–106)
CO2: 30 mmol/L — AB (ref 18–29)
CREATININE: 0.64 mg/dL (ref 0.57–1.00)
Calcium: 9.2 mg/dL (ref 8.7–10.2)
GFR calc Af Amer: 115 mL/min/{1.73_m2} (ref >59)
GFR calc non Af Amer: 99 mL/min/{1.73_m2} (ref >59)
GLUCOSE: 85 mg/dL (ref 65–99)
Potassium: 4.4 mmol/L (ref 3.5–5.2)
SODIUM: 142 mmol/L (ref 134–144)

## 2017-03-13 ENCOUNTER — Other Ambulatory Visit: Payer: Self-pay | Admitting: Family

## 2017-03-15 NOTE — Telephone Encounter (Signed)
Please forward to Christy as she is here tomorrow 

## 2017-03-16 NOTE — Telephone Encounter (Signed)
Rx called in 

## 2017-03-28 ENCOUNTER — Other Ambulatory Visit: Payer: Self-pay | Admitting: Gastroenterology

## 2017-04-15 ENCOUNTER — Telehealth: Payer: Self-pay | Admitting: *Deleted

## 2017-04-15 DIAGNOSIS — G4700 Insomnia, unspecified: Secondary | ICD-10-CM

## 2017-04-15 DIAGNOSIS — F411 Generalized anxiety disorder: Secondary | ICD-10-CM

## 2017-04-15 DIAGNOSIS — F331 Major depressive disorder, recurrent, moderate: Secondary | ICD-10-CM

## 2017-04-15 MED ORDER — TRAZODONE HCL 100 MG PO TABS: 100.0000 mg | ORAL_TABLET | Freq: Every day | ORAL | 1 refills | Status: DC

## 2017-04-15 MED ORDER — CITALOPRAM HYDROBROMIDE 40 MG PO TABS: 40.0000 mg | ORAL_TABLET | Freq: Every day | ORAL | 1 refills | Status: DC

## 2017-04-15 NOTE — Telephone Encounter (Signed)
Detailed message left for patient.

## 2017-04-15 NOTE — Telephone Encounter (Signed)
Rx refilled with note to pharmacy.   QTC good.   Laroy Apple, MD [facility] 04/15/2017, 4:41 PM

## 2017-04-15 NOTE — Telephone Encounter (Signed)
She needs refills on trazodone and celexa. She says [facility] will not fill because there is a warning on taking the 2 together? She has been taking for 10+ years

## 2017-04-21 ENCOUNTER — Other Ambulatory Visit: Payer: Self-pay | Admitting: Family

## 2017-06-24 ENCOUNTER — Telehealth: Payer: Self-pay | Admitting: Family Medicine

## 2017-06-24 MED ORDER — MAGIC MOUTHWASH: 5.0000 mL | Freq: Four times a day (QID) | ORAL | 0 refills | Status: DC

## 2017-06-24 NOTE — Telephone Encounter (Signed)
magoic mouthwash to be called in

## 2017-06-24 NOTE — Telephone Encounter (Signed)
What symptoms do you have? Thrush. Wants magic mouthwash  How long have you been sick? weeks  Have you been seen for this problem? yes  If your provider decides to give you a prescription, which pharmacy would you like for it to be sent to? [facility].   Patient informed that this information will be sent to the clinical staff for review and that they should receive a follow up call.

## 2017-06-24 NOTE — Telephone Encounter (Signed)
Aware. 

## 2017-06-30 ENCOUNTER — Emergency Department (HOSPITAL_COMMUNITY): Payer: Medicare PPO

## 2017-06-30 ENCOUNTER — Observation Stay (HOSPITAL_COMMUNITY)
Admission: EM | Admit: 2017-06-30 | Discharge: 2017-07-01 | Disposition: A | Discharge status: Home / Self Care | Payer: Medicare PPO | Attending: Internal Medicine | Admitting: Internal Medicine

## 2017-06-30 ENCOUNTER — Encounter (HOSPITAL_COMMUNITY): Payer: Self-pay | Admitting: Emergency Medicine

## 2017-06-30 DIAGNOSIS — K219 Gastro-esophageal reflux disease without esophagitis: Secondary | ICD-10-CM | POA: Diagnosis not present

## 2017-06-30 DIAGNOSIS — R079 Chest pain, unspecified: Secondary | ICD-10-CM | POA: Diagnosis not present

## 2017-06-30 DIAGNOSIS — F411 Generalized anxiety disorder: Secondary | ICD-10-CM | POA: Diagnosis not present

## 2017-06-30 DIAGNOSIS — Z79899 Other long term (current) drug therapy: Secondary | ICD-10-CM | POA: Diagnosis not present

## 2017-06-30 DIAGNOSIS — I10 Essential (primary) hypertension: Secondary | ICD-10-CM | POA: Diagnosis present

## 2017-06-30 DIAGNOSIS — F17210 Nicotine dependence, cigarettes, uncomplicated: Secondary | ICD-10-CM | POA: Diagnosis not present

## 2017-06-30 HISTORY — DX: Chest pain, unspecified: R07.9

## 2017-06-30 LAB — URINALYSIS, ROUTINE W REFLEX MICROSCOPIC
Bacteria, UA: NONE SEEN
GLUCOSE, UA: NEGATIVE mg/dL
Hgb urine dipstick: NEGATIVE
KETONES UR: NEGATIVE mg/dL
LEUKOCYTES UA: NEGATIVE
Nitrite: NEGATIVE
PH: 7 (ref 5.0–8.0)
Protein, ur: 30 mg/dL — AB
SPECIFIC GRAVITY, URINE: 1.031 — AB (ref 1.005–1.030)

## 2017-06-30 LAB — CBC
HCT: 42.2 % (ref 36.0–46.0)
HEMOGLOBIN: 13.2 g/dL (ref 12.0–15.0)
MCH: 27.1 pg (ref 26.0–34.0)
MCHC: 31.3 g/dL (ref 30.0–36.0)
MCV: 86.7 fL (ref 78.0–100.0)
PLATELETS: 278 10*3/uL (ref 150–400)
RBC: 4.87 MIL/uL (ref 3.87–5.11)
RDW: 15 % (ref 11.5–15.5)
WBC: 11.3 10*3/uL — ABNORMAL HIGH (ref 4.0–10.5)

## 2017-06-30 LAB — TROPONIN I
Troponin I: <0.03 ng/mL
Troponin I: <0.03 ng/mL

## 2017-06-30 LAB — BASIC METABOLIC PANEL
ANION GAP: 5 (ref 5–15)
BUN: 16 mg/dL (ref 6–20)
CALCIUM: 8.9 mg/dL (ref 8.9–10.3)
CO2: 32 mmol/L (ref 22–32)
Chloride: 103 mmol/L (ref 101–111)
Creatinine, Ser: 0.82 mg/dL (ref 0.44–1.00)
GFR calc Af Amer: >60 mL/min (ref >60)
GLUCOSE: 125 mg/dL — AB (ref 65–99)
Potassium: 4 mmol/L (ref 3.5–5.1)
Sodium: 140 mmol/L (ref 135–145)

## 2017-06-30 LAB — CREATININE, SERUM
Creatinine, Ser: 0.79 mg/dL (ref 0.44–1.00)
GFR calc Af Amer: >60 mL/min
GFR calc non Af Amer: >60 mL/min

## 2017-06-30 LAB — I-STAT TROPONIN, ED
TROPONIN I, POC: 0.01 ng/mL (ref 0.00–0.08)
Troponin i, poc: 0.02 ng/mL (ref 0.00–0.08)

## 2017-06-30 LAB — D-DIMER, QUANTITATIVE: D-Dimer, Quant: 0.87 ug/mL-FEU — ABNORMAL HIGH (ref 0.00–0.50)

## 2017-06-30 MED ORDER — HYDROCODONE-ACETAMINOPHEN 10-325 MG PO TABS
1.0000 | ORAL_TABLET | Freq: Four times a day (QID) | ORAL | Status: DC | PRN
  Administered 2017-06-30 – 2017-07-01 (×3): 1 via ORAL
  Filled 2017-06-30 (×3): qty 1

## 2017-06-30 MED ORDER — ASPIRIN 325 MG PO TABS: 325.0000 mg | ORAL_TABLET | Freq: Once | ORAL | Status: DC

## 2017-06-30 MED ORDER — ASPIRIN 325 MG PO TABS
325.0000 mg | ORAL_TABLET | Freq: Every day | ORAL | Status: DC
  Administered 2017-07-01: 325 mg via ORAL
  Filled 2017-06-30 (×2): qty 1

## 2017-06-30 MED ORDER — TRAZODONE HCL 50 MG PO TABS
100.0000 mg | ORAL_TABLET | Freq: Every day | ORAL | Status: DC
  Administered 2017-06-30: 100 mg via ORAL
  Filled 2017-06-30: qty 2

## 2017-06-30 MED ORDER — ENOXAPARIN SODIUM 40 MG/0.4ML ~~LOC~~ SOLN
40.0000 mg | SUBCUTANEOUS | Status: DC
  Administered 2017-06-30: 40 mg via SUBCUTANEOUS
  Filled 2017-06-30: qty 0.4

## 2017-06-30 MED ORDER — PANTOPRAZOLE SODIUM 40 MG PO TBEC
40.0000 mg | DELAYED_RELEASE_TABLET | Freq: Two times a day (BID) | ORAL | Status: DC
  Administered 2017-06-30 – 2017-07-01 (×2): 40 mg via ORAL
  Filled 2017-06-30 (×3): qty 1

## 2017-06-30 MED ORDER — LISINOPRIL 10 MG PO TABS
10.0000 mg | ORAL_TABLET | Freq: Every day | ORAL | Status: DC
  Filled 2017-06-30 (×3): qty 1

## 2017-06-30 MED ORDER — ALPRAZOLAM 0.5 MG PO TABS
0.5000 mg | ORAL_TABLET | Freq: Two times a day (BID) | ORAL | Status: DC | PRN
Start: 2017-06-30 — End: 2017-07-01
  Administered 2017-06-30 – 2017-07-01 (×2): 0.5 mg via ORAL
  Filled 2017-06-30 (×2): qty 1

## 2017-06-30 MED ORDER — IOPAMIDOL (ISOVUE-370) INJECTION 76%
100.0000 mL | Freq: Once | INTRAVENOUS | Status: AC | PRN
  Administered 2017-06-30: 100 mL via INTRAVENOUS

## 2017-06-30 MED ORDER — GABAPENTIN 300 MG PO CAPS
600.0000 mg | ORAL_CAPSULE | Freq: Three times a day (TID) | ORAL | Status: DC
  Administered 2017-06-30 – 2017-07-01 (×3): 600 mg via ORAL
  Filled 2017-06-30 (×2): qty 2
  Filled 2017-06-30: qty 6
  Filled 2017-06-30: qty 2

## 2017-06-30 MED ORDER — ATORVASTATIN CALCIUM 20 MG PO TABS: 20.0000 mg | ORAL_TABLET | Freq: Every day | ORAL | Status: DC

## 2017-06-30 MED ORDER — ONDANSETRON HCL 4 MG PO TABS: 4.0000 mg | ORAL_TABLET | Freq: Every day | ORAL | Status: DC | PRN

## 2017-06-30 MED ORDER — CITALOPRAM HYDROBROMIDE 20 MG PO TABS
40.0000 mg | ORAL_TABLET | Freq: Every day | ORAL | Status: DC
  Administered 2017-07-01: 40 mg via ORAL
  Filled 2017-06-30 (×2): qty 2

## 2017-06-30 MED ORDER — DIPHENHYDRAMINE HCL 25 MG PO CAPS: 25.0000 mg | ORAL_CAPSULE | Freq: Every day | ORAL | Status: DC | PRN

## 2017-06-30 MED ORDER — ACETAMINOPHEN 325 MG PO TABS: 650.0000 mg | ORAL_TABLET | ORAL | Status: DC | PRN

## 2017-06-30 MED ORDER — MAGIC MOUTHWASH
5.0000 mL | Freq: Four times a day (QID) | ORAL | Status: DC
  Administered 2017-06-30 – 2017-07-01 (×3): 5 mL via ORAL
  Filled 2017-06-30 (×4): qty 5

## 2017-06-30 MED ORDER — METOCLOPRAMIDE HCL 10 MG PO TABS
10.0000 mg | ORAL_TABLET | Freq: Every day | ORAL | Status: DC
  Administered 2017-07-01: 10 mg via ORAL
  Filled 2017-06-30 (×2): qty 1

## 2017-06-30 MED ORDER — VITAMIN D (ERGOCALCIFEROL) 1.25 MG (50000 UNIT) PO CAPS: 50000.0000 [IU] | ORAL_CAPSULE | ORAL | Status: DC

## 2017-06-30 MED ORDER — ONDANSETRON HCL 4 MG/2ML IJ SOLN: 4.0000 mg | Freq: Four times a day (QID) | INTRAMUSCULAR | Status: DC | PRN

## 2017-06-30 MED ORDER — ALPRAZOLAM 0.5 MG PO TABS
0.5000 mg | ORAL_TABLET | Freq: Once | ORAL | Status: AC
  Administered 2017-06-30: 0.5 mg via ORAL
  Filled 2017-06-30: qty 1

## 2017-06-30 MED ORDER — ATENOLOL 25 MG PO TABS
50.0000 mg | ORAL_TABLET | Freq: Every day | ORAL | Status: DC
  Administered 2017-06-30 – 2017-07-01 (×2): 50 mg via ORAL
  Filled 2017-06-30 (×3): qty 2

## 2017-06-30 NOTE — ED Provider Notes (Addendum)
[facility] DEPT Provider Note   CSN: [id number] Arrival date & time: 06/30/17  0814     History   Chief Complaint Chief Complaint  Patient presents with  . Chest Pain    HPI [name] is a 56 y.o. female.  Sharp chest pain radiating to the left shoulder since yesterday. EMS came to the house yesterday and patient decided to stay home. Her blood pressure has been elevated recently. She has been on antihypertensive hydrochlorothiazide and lisinopril but they were discontinued recently.review of systems positive for dyspnea and nausea but no diaphoresis. She has been seen by Dr. Harl Bowie in the past. Nitroglycerin appeared to help pain.      Past Medical History:  Diagnosis Date  . Abdominal pain   . Anxiety   . Arthritis   . Blood in stool   . Breast changes, fibrocystic   . Bruises easily   . Clotting disorder (HCC)     itp , none since 1976, no current hematologist  . Constipation   . DDD (degenerative disc disease), cervical    with lumbar issues  . Depression   . Generalized headaches   . GERD (gastroesophageal reflux disease)   . Hemorrhoid   . Hyperlipidemia   . Hypertension   . Idiopathic thrombocytopenia ([place])    as child  . Leg swelling    both ankles  . Liver lesion   . Nasal congestion   . Nausea & vomiting   . Neuromuscular disorder ([place])    back  injury  . Neuromuscular disorder ([place])    3 neck surgeries,neck fusion.pin,plates,screws  . Osteoporosis   . Panic attacks    pt also  has germaphobia  . Rectal bleeding   . Seizures ([place]) 1998   stress induced, one time, none since, no seizure meds  . Seizures ([place])    Stated she had 2 weeks ago,it was like a transient,stare.  . Sleep apnea    STOPBANG=4  . Spleen enlarged 1976, none since   r/t idiopathic thrombocytopenia  . Trouble swallowing   . Unintentional weight loss   . Weakness    weakness varies    Patient Active Problem List   Diagnosis Date Noted  . Dehydration  07/10/2016  . AKI (acute kidney injury) ([place]) 07/09/2016  . Hypokalemia 07/09/2016  . Obesity (BMI 30-39.9) 07/02/2016  . Osteopenia 12/20/2013  . Diverticulosis of colon (without mention of hemorrhage) 02/04/2012  . GERD (gastroesophageal reflux disease) 02/04/2012  . Dysphagia 02/04/2012  . Unspecified gastritis and gastroduodenitis without mention of hemorrhage 02/04/2012  . Cystic liver mass 01/26/2012  . Cholecystitis chronic 01/26/2012  . SYNCOPE 12/20/2007  . GAD (generalized anxiety disorder) 01/25/2007  . Depression 01/25/2007  . Essential hypertension 01/25/2007  . BREAST MASSES, BILATERAL 01/25/2007  . DEGENERATIVE DISC DISEASE, CERVICAL SPINE 01/25/2007  . LOW BACK PAIN 01/25/2007  . SEIZURE DISORDER 01/25/2007  . Insomnia 01/25/2007  . DEPENDENT EDEMA, LEGS, BILATERAL 01/25/2007  . Current smoker 01/25/2007  . TAH/BSO, HX OF 01/25/2007    Past Surgical History:  Procedure Laterality Date  . ABDOMINAL HYSTERECTOMY  1997  . BACK SURGERY    . CERVICAL SPINE SURGERY  2005, 2007, 2010   x3, fusion done,plates and screws present  . [place]  . CHOLECYSTECTOMY  03/14/2012   Procedure: LAPAROSCOPIC CHOLECYSTECTOMY;  Surgeon: Stark Klein, MD;  Location: [facility]S;  Service: General;  Laterality: N/A;  . colonscopy and esophagogastrodudononescopy  02-04-12  . LUMBAR DISC SURGERY  11/2014    OB History    Gravida Para Term Preterm AB Living   3 3 1 2   3    SAB TAB Ectopic Multiple Live Births                   Home Medications    Prior to Admission medications   Medication Sig Start Date End Date Taking? Authorizing Provider  ALPRAZolam Duanne Moron) 0.5 MG tablet TAKE 1 TABLET BY MOUTH TWICE DAILY AS NEEDED 03/16/17  Yes Hawks, Christy A, FNP  atenolol (TENORMIN) 50 MG tablet TAKE 1 TABLET BY MOUTH ONCE DAILY WITH BREAKFAST 04/22/17  Yes Hawks, Christy A, FNP  atorvastatin (LIPITOR) 20 MG tablet Take 1 tablet (20 mg total) by mouth daily. 07/17/16  Yes  Timmothy Euler, MD  citalopram (CELEXA) 40 MG tablet Take 1 tablet (40 mg total) by mouth daily with breakfast. 04/15/17  Yes Timmothy Euler, MD  diphenhydrAMINE (BENADRYL) 25 mg capsule Take 25 mg by mouth daily as needed for allergies.   Yes [provider]  gabapentin (NEURONTIN) 300 MG capsule Take 600 mg by mouth 3 (three) times daily.  09/28/13  Yes [provider]  HYDROcodone-acetaminophen (NORCO) 10-325 MG tablet Take 1 tablet by mouth every 6 (six) hours as needed for moderate pain or severe pain.  07/16/16  Yes [provider]  lisinopril (PRINIVIL,ZESTRIL) 10 MG tablet Take 1 tablet (10 mg total) by mouth daily. Patient taking differently: Take 10 mg by mouth daily. Took 20mg  this morning 03/09/17  Yes Hawks, Christy A, FNP  magic mouthwash SOLN Take 5 mLs by mouth 4 (four) times daily. 06/24/17  Yes Timmothy Euler, MD  metoCLOPramide (REGLAN) 10 MG tablet Take 1 tablet (10 mg total) by mouth daily with breakfast. 01/04/17  Yes Milus Banister, MD  ondansetron [facility]) 4 MG tablet Take 1 tablet by mouth daily as needed for nausea or vomiting.  04/02/16  Yes [provider]  pantoprazole (PROTONIX) 40 MG tablet TAKE ONE TABLET BY MOUTH TWICE DAILY 11/27/16  Yes Milus Banister, MD  traZODone (DESYREL) 100 MG tablet Take 1 tablet (100 mg total) by mouth at bedtime. 04/15/17  Yes Timmothy Euler, MD  sulfamethoxazole-trimethoprim (BACTRIM DS) 800-160 MG tablet Take 1 tablet by mouth 2 (two) times daily. Patient not taking: Reported on 06/30/2017 03/09/17   Sharion Balloon, FNP  Vitamin D, Ergocalciferol, (DRISDOL) 50000 units CAPS capsule Take 1 capsule (50,000 Units total) by mouth every 7 (seven) days. Patient not taking: Reported on 06/30/2017 07/03/16   Sharion Balloon, FNP    Family History Family History  Problem Relation Age of Onset  . Hypertension Mother   . Heart disease Mother   . Lung cancer Father   . Cancer Father        lung  .  Colon cancer Paternal Grandmother   . Cancer Paternal Grandmother        colon  . Heart disease Maternal Grandmother   . Diabetes Sister     Social History Social History  Substance Use Topics  . Smoking status: Current Every Day Smoker    Packs/day: 1.00    Years: 5.00    Types: Cigarettes    Start date: 12/20/2009  . Smokeless tobacco: Never Used     Comment: VAPOR- OCCASIONALLY  . Alcohol use No     Allergies   Nsaids   Review of Systems Review of Systems  All other systems reviewed and  are negative.    Physical Exam Updated Vital Signs BP (!) 143/84 (BP Location: Left Arm)   Pulse 66   Temp 98 F (36.7 C) (Oral)   Resp 20   Ht 5\' 4"  (1.626 m)   Wt 85.3 kg (188 lb)   SpO2 95%   BMI 32.27 kg/m   Physical Exam  Constitutional: She is oriented to person, place, and time. She appears well-developed and well-nourished.  HENT:  Head: Normocephalic and atraumatic.  Eyes: Conjunctivae are normal.  Neck: Neck supple.  Cardiovascular: Normal rate and regular rhythm.   Pulmonary/Chest: Effort normal and breath sounds normal.  Abdominal: Soft. Bowel sounds are normal.  Musculoskeletal: Normal range of motion.  Neurological: She is alert and oriented to person, place, and time.  Skin: Skin is warm and dry.  Psychiatric: She has a normal mood and affect. Her behavior is normal.  Nursing note and vitals reviewed.    ED Treatments / Results  Labs (all labs ordered are listed, but only abnormal results are displayed) Labs Reviewed  BASIC METABOLIC PANEL - Abnormal; Notable for the following:       Result Value   Glucose, Bld 125 (*)    All other components within normal limits  CBC - Abnormal; Notable for the following:    WBC 11.3 (*)    All other components within normal limits  D-DIMER, QUANTITATIVE (NOT AT [facility]) - Abnormal; Notable for the following:    D-Dimer, Quant 0.87 (*)    All other components within normal limits  URINALYSIS, ROUTINE W REFLEX  MICROSCOPIC - Abnormal; Notable for the following:    APPearance HAZY (*)    Specific Gravity, Urine 1.031 (*)    Bilirubin Urine MODERATE (*)    Protein, ur 30 (*)    Squamous Epithelial / LPF 0-5 (*)    All other components within normal limits  I-STAT TROPONIN, ED  I-STAT TROPONIN, ED    EKG  EKG Interpretation  Date/Time:  Wednesday June 30 2017 08:17:39 EDT Ventricular Rate:  69 PR Interval:    QRS Duration: 83 QT Interval:  405 QTC Calculation: 434 R Axis:   63 Text Interpretation:  Sinus rhythm Abnormal R-wave progression, early transition Confirmed by Nat Christen [phone]) on 06/30/2017 8:35:27 AM       Radiology Dg Chest 2 View  Result Date: 06/30/2017 CLINICAL DATA:  Mid sternal chest pain radiating to the back beginning yesterday. EXAM: CHEST  2 VIEW COMPARISON:  07/09/2016 FINDINGS: Heart upper limits of normal in size. Slightly prominent chronic interstitial lung markings. Pulmonary vascularity within normal limits. No infiltrate, mass, effusion or collapse. No acute bone finding. Previous cervicothoracic fusion. IMPRESSION: No active cardiopulmonary disease. Electronically Signed   By: Nelson Chimes M.D.   On: 06/30/2017 08:58   Ct Angio Chest Pe W And/or Wo Contrast  Result Date: 06/30/2017 CLINICAL DATA:  Suspected pulmonary embolism. Midsternal chest pain radiating to the back. EXAM: CT ANGIOGRAPHY CHEST WITH CONTRAST TECHNIQUE: Multidetector CT imaging of the chest was performed using the standard protocol during bolus administration of intravenous contrast. Multiplanar CT image reconstructions and MIPs were obtained to evaluate the vascular anatomy. CONTRAST:  100 cc Isovue 370 intravenous COMPARISON:  03/18/2012 FINDINGS: Cardiovascular: Satisfactory opacification of the pulmonary arteries to the segmental level. No evidence of pulmonary embolism. Cardiomegaly. No pericardial effusion. Mild atherosclerotic calcifications seen along the aorta. No acute aortic  finding. Mediastinum/Nodes: Negative for mass or adenopathy. Lungs/Pleura: There is no edema, consolidation, effusion, or pneumothorax.  Upper Abdomen: No acute finding. Musculoskeletal: No acute or aggressive finding. Cervicothoracic fusion. Review of the MIP images confirms the above findings. IMPRESSION: Negative for pulmonary embolism or other acute finding. Electronically Signed   By: Monte Fantasia M.D.   On: 06/30/2017 11:36    Procedures Procedures (including critical care time)  Medications Ordered in ED Medications  iopamidol (ISOVUE-370) 76 % injection 100 mL (100 mLs Intravenous Contrast Given 06/30/17 1113)     Initial Impression / Assessment and Plan / ED Course  I have reviewed the triage vital signs and the nursing notes.  Pertinent labs & imaging results that were available during my care of the patient were reviewed by me and considered in my medical decision making (see chart for details).     Patient presents with chest pain.EKG, troponin, CT chest on negative. Discussed with cardiologist. Will admit to general medicine.  Final Clinical Impressions(s) / ED Diagnoses   Final diagnoses:  Chest pain, unspecified type    New Prescriptions New Prescriptions   No medications on file     Nat Christen, MD 06/30/17 [place]    Nat Christen, MD 07/01/17 [phone]

## 2017-06-30 NOTE — ED Triage Notes (Signed)
Patient brought in via EMS from home. Alert and oriented. Airway patent. Patient c/o mid-sternal chest pain that radiates to back. Per EMS Blood pressure 234/140 upon there arrival. Patient rated pain at 9/10 when EMS arrived on scene. 1SL nitro given in route. Patient now rates pain at a 3/10. Blood pressure 132/70 after nitro per EMS. Denies any cardiac hx.

## 2017-06-30 NOTE — H&P (Signed)
History and Physical    [name] JQZ:[id number] DOB: [birth year] DOA: 06/30/2017  Referring MD/NP/PA: Lacinda Axon  PCP: Timmothy Euler, MD  Outpatient Specialists: none Patient coming from: Home   Chief Complaint: CP, Hypertension   HPI: [name] is a 56 y.o. female with medical history significant of multiple medical issues including hypertension, GERD, depression, anxiety, sleep apnea presenting with chest pain and hypertensive urgency. Patient reports progressive anterior chest wall chest chest pain since last night. Chest pain constant. Moderate in intensity. Reports blood pressures at home in the 622Q to 333L systolic. No fevers or chills. Positive mild diaphoresis and nausea. Mild shortness of breath. Denies any prior history of coronary disease. Baseline tobacco abuse.   ED Course: Presented to ER afebrile, hemodynamically stable. Troponin negative 2. D-dimer mildly elevated at 0.8. CT angiogram negative for PE. EKG NSR.  HEART Score around 3.   Review of Systems: As per HPI otherwise 10 point review of systems negative.    Past Medical History:  Diagnosis Date  . Abdominal pain   . Anxiety   . Arthritis   . Blood in stool   . Breast changes, fibrocystic   . Bruises easily   . Clotting disorder (HCC)     itp , none since 1976, no current hematologist  . Constipation   . DDD (degenerative disc disease), cervical    with lumbar issues  . Depression   . Generalized headaches   . GERD (gastroesophageal reflux disease)   . Hemorrhoid   . Hyperlipidemia   . Hypertension   . Idiopathic thrombocytopenia ([place])    as child  . Leg swelling    both ankles  . Liver lesion   . Nasal congestion   . Nausea & vomiting   . Neuromuscular disorder ([place])    back  injury  . Neuromuscular disorder ([place])    3 neck surgeries,neck fusion.pin,plates,screws  . Osteoporosis   . Panic attacks    pt also  has germaphobia  . Rectal bleeding   . Seizures ([place]) 1998   stress  induced, one time, none since, no seizure meds  . Seizures ([place])    Stated she had 2 weeks ago,it was like a transient,stare.  . Sleep apnea    STOPBANG=4  . Spleen enlarged 1976, none since   r/t idiopathic thrombocytopenia  . Trouble swallowing   . Unintentional weight loss   . Weakness    weakness varies    Past Surgical History:  Procedure Laterality Date  . ABDOMINAL HYSTERECTOMY  1997  . BACK SURGERY    . CERVICAL SPINE SURGERY  2005, 2007, 2010   x3, fusion done,plates and screws present  . [place]  . CHOLECYSTECTOMY  03/14/2012   Procedure: LAPAROSCOPIC CHOLECYSTECTOMY;  Surgeon: Stark Klein, MD;  Location: [facility] ORS;  Service: General;  Laterality: N/A;  . colonscopy and esophagogastrodudononescopy  02-04-12  . [place] SURGERY  11/2014     reports that she has been smoking Cigarettes.  She started smoking about 7 years ago. She has a 5.00 pack-year smoking history. She has never used smokeless tobacco. She reports that she does not drink alcohol or use drugs.  Allergies  Allergen Reactions  . Nsaids Nausea Only    Abdominal pain    Family History  Problem Relation Age of Onset  . Hypertension Mother   . Heart disease Mother   . Lung cancer Father   . Cancer Father  lung  . Colon cancer Paternal Grandmother   . Cancer Paternal Grandmother        colon  . Heart disease Maternal Grandmother   . Diabetes Sister      Prior to Admission medications   Medication Sig Start Date End Date Taking? Authorizing Provider  ALPRAZolam Duanne Moron) 0.5 MG tablet TAKE 1 TABLET BY MOUTH TWICE DAILY AS NEEDED 03/16/17  Yes Hawks, Christy A, FNP  atenolol (TENORMIN) 50 MG tablet TAKE 1 TABLET BY MOUTH ONCE DAILY WITH BREAKFAST 04/22/17  Yes Hawks, Christy A, FNP  atorvastatin (LIPITOR) 20 MG tablet Take 1 tablet (20 mg total) by mouth daily. 07/17/16  Yes Timmothy Euler, MD  citalopram (CELEXA) 40 MG tablet Take 1 tablet (40 mg total) by mouth daily with  breakfast. 04/15/17  Yes Timmothy Euler, MD  diphenhydrAMINE (BENADRYL) 25 mg capsule Take 25 mg by mouth daily as needed for allergies.   Yes [provider]  gabapentin (NEURONTIN) 300 MG capsule Take 600 mg by mouth 3 (three) times daily.  09/28/13  Yes [provider]  HYDROcodone-acetaminophen (NORCO) 10-325 MG tablet Take 1 tablet by mouth every 6 (six) hours as needed for moderate pain or severe pain.  07/16/16  Yes [provider]  lisinopril (PRINIVIL,ZESTRIL) 10 MG tablet Take 1 tablet (10 mg total) by mouth daily. Patient taking differently: Take 10 mg by mouth daily. Took 20mg  this morning 03/09/17  Yes Hawks, Christy A, FNP  magic mouthwash SOLN Take 5 mLs by mouth 4 (four) times daily. 06/24/17  Yes Timmothy Euler, MD  metoCLOPramide (REGLAN) 10 MG tablet Take 1 tablet (10 mg total) by mouth daily with breakfast. 01/04/17  Yes Milus Banister, MD  ondansetron [facility]) 4 MG tablet Take 1 tablet by mouth daily as needed for nausea or vomiting.  04/02/16  Yes [provider]  pantoprazole (PROTONIX) 40 MG tablet TAKE ONE TABLET BY MOUTH TWICE DAILY 11/27/16  Yes Milus Banister, MD  traZODone (DESYREL) 100 MG tablet Take 1 tablet (100 mg total) by mouth at bedtime. 04/15/17  Yes Timmothy Euler, MD  sulfamethoxazole-trimethoprim (BACTRIM DS) 800-160 MG tablet Take 1 tablet by mouth 2 (two) times daily. Patient not taking: Reported on 06/30/2017 03/09/17   Sharion Balloon, FNP  Vitamin D, Ergocalciferol, (DRISDOL) 50000 units CAPS capsule Take 1 capsule (50,000 Units total) by mouth every 7 (seven) days. Patient not taking: Reported on 06/30/2017 07/03/16   Sharion Balloon, FNP    Physical Exam: Vitals:   06/30/17 1200 06/30/17 1231 06/30/17 1300 06/30/17 1333  BP: (!) 173/99 (!) 175/85 (!) 169/93 (!) 143/84  Pulse: 97 66 68 66  Resp: 18 18 14 20   Temp:      TempSrc:      SpO2: 96% 96% 95% 95%  Weight:      Height:          Constitutional:  NAD, calm, comfortable Vitals:   06/30/17 1200 06/30/17 1231 06/30/17 1300 06/30/17 1333  BP: (!) 173/99 (!) 175/85 (!) 169/93 (!) 143/84  Pulse: 97 66 68 66  Resp: 18 18 14 20   Temp:      TempSrc:      SpO2: 96% 96% 95% 95%  Weight:      Height:       Eyes: PERRL, lids and conjunctivae normal ENMT: Mucous membranes are moist. Posterior pharynx clear of any exudate or lesions.Normal dentition.  Neck: normal, supple, no masses, no thyromegaly Respiratory: clear  to auscultation bilaterally, no wheezing, no crackles. Normal respiratory effort. No accessory muscle use.  Cardiovascular: Regular rate and rhythm, no murmurs / rubs / gallops. No extremity edema. 2+ pedal pulses. No carotid bruits.  Abdomen: no tenderness, no masses palpated. No hepatosplenomegaly. Bowel sounds positive.  Musculoskeletal: no clubbing / cyanosis. No joint deformity upper and lower extremities. Good ROM, no contractures. Normal muscle tone.  Skin: no rashes, lesions, ulcers. No induration Neurologic: CN 2-12 grossly intact. Sensation intact, DTR normal. Strength 5/5 in all 4.  Psychiatric: Normal judgment and insight. Alert and oriented x 3. Normal mood.    Labs on Admission: I have personally reviewed following labs and imaging studies  CBC:  Recent Labs Lab 06/30/17 0825  WBC 11.3*  HGB 13.2  HCT 42.2  MCV 86.7  PLT 242   Basic Metabolic Panel:  Recent Labs Lab 06/30/17 0825  NA 140  K 4.0  CL 103  CO2 32  GLUCOSE 125*  BUN 16  CREATININE 0.82  CALCIUM 8.9   GFR: Estimated Creatinine Clearance: 79 mL/min (by C-G formula based on SCr of 0.82 mg/dL). Liver Function Tests: No results for input(s): AST, ALT, ALKPHOS, BILITOT, PROT, ALBUMIN in the last 168 hours. No results for input(s): LIPASE, AMYLASE in the last 168 hours. No results for input(s): AMMONIA in the last 168 hours. Coagulation Profile: No results for input(s): INR, PROTIME in the last 168 hours. Cardiac Enzymes: No  results for input(s): CKTOTAL, CKMB, CKMBINDEX, TROPONINI in the last 168 hours. BNP (last 3 results) No results for input(s): PROBNP in the last 8760 hours. HbA1C: No results for input(s): HGBA1C in the last 72 hours. CBG: No results for input(s): GLUCAP in the last 168 hours. Lipid Profile: No results for input(s): CHOL, HDL, LDLCALC, TRIG, CHOLHDL, LDLDIRECT in the last 72 hours. Thyroid Function Tests: No results for input(s): TSH, T4TOTAL, FREET4, T3FREE, THYROIDAB in the last 72 hours. Anemia Panel: No results for input(s): VITAMINB12, FOLATE, FERRITIN, TIBC, IRON, RETICCTPCT in the last 72 hours. Urine analysis:    Component Value Date/Time   COLORURINE YELLOW 06/30/2017 0950   APPEARANCEUR HAZY (A) 06/30/2017 0950   APPEARANCEUR Clear 03/05/2017 1501   LABSPEC 1.031 (H) 06/30/2017 0950   PHURINE 7.0 06/30/2017 0950   GLUCOSEU NEGATIVE 06/30/2017 0950   HGBUR NEGATIVE 06/30/2017 0950   BILIRUBINUR MODERATE (A) 06/30/2017 0950   BILIRUBINUR Negative 03/05/2017 1501   KETONESUR NEGATIVE 06/30/2017 0950   PROTEINUR 30 (A) 06/30/2017 0950   UROBILINOGEN negative 03/21/2015 1636   UROBILINOGEN 0.2 03/16/2012 0946   NITRITE NEGATIVE 06/30/2017 0950   LEUKOCYTESUR NEGATIVE 06/30/2017 0950   LEUKOCYTESUR Trace (A) 03/05/2017 1501   Sepsis Labs: @LABRCNTIP (procalcitonin:4,lacticidven:4) )No results found for this or any previous visit (from the past 240 hour(s)).   Radiological Exams on Admission: Dg Chest 2 View  Result Date: 06/30/2017 CLINICAL DATA:  Mid sternal chest pain radiating to the back beginning yesterday. EXAM: CHEST  2 VIEW COMPARISON:  07/09/2016 FINDINGS: Heart upper limits of normal in size. Slightly prominent chronic interstitial lung markings. Pulmonary vascularity within normal limits. No infiltrate, mass, effusion or collapse. No acute bone finding. Previous cervicothoracic fusion. IMPRESSION: No active cardiopulmonary disease. Electronically Signed   By:  Nelson Chimes M.D.   On: 06/30/2017 08:58   Ct Angio Chest Pe W And/or Wo Contrast  Result Date: 06/30/2017 CLINICAL DATA:  Suspected pulmonary embolism. Midsternal chest pain radiating to the back. EXAM: CT ANGIOGRAPHY CHEST WITH CONTRAST TECHNIQUE: Multidetector CT imaging of  the chest was performed using the standard protocol during bolus administration of intravenous contrast. Multiplanar CT image reconstructions and MIPs were obtained to evaluate the vascular anatomy. CONTRAST:  100 cc Isovue 370 intravenous COMPARISON:  03/18/2012 FINDINGS: Cardiovascular: Satisfactory opacification of the pulmonary arteries to the segmental level. No evidence of pulmonary embolism. Cardiomegaly. No pericardial effusion. Mild atherosclerotic calcifications seen along the aorta. No acute aortic finding. Mediastinum/Nodes: Negative for mass or adenopathy. Lungs/Pleura: There is no edema, consolidation, effusion, or pneumothorax. Upper Abdomen: No acute finding. Musculoskeletal: No acute or aggressive finding. Cervicothoracic fusion. Review of the MIP images confirms the above findings. IMPRESSION: Negative for pulmonary embolism or other acute finding. Electronically Signed   By: Monte Fantasia M.D.   On: 06/30/2017 11:36    EKG: Independently reviewed. NSR  Assessment/Plan Active Problems:   Chest pain     1-CP  -sxs mixed w/ typical and atypical features  -trop neg x 2 today  -EKG, CXR WNL  -cycle CEs  -risk stratification labs -full dose ASA -prn NTG  -stress in am  -Dr. Domenic Polite w/ cards aware of case per EDP- to c/s in am  -suspect anxiety component- cont xanax  -follow   2-HTN -Elevated BP -titrate home regimen   3-Anxiety/Depression  -cont home regimen  -? If exacerbating #1    DVT prophylaxis: - ppx lovenox   Code Status: Full Code   Disposition Plan: Pending further evaluation Consults called: Cards Domenic Polite) aware per EDP- c/s in am Admission status: Obs     Deneise Lever  MD [facility] Pager [phone]  If 7PM-7AM, please contact night-coverage [email or web address] Password [facility]  06/30/2017, 4:09 PM

## 2017-07-01 ENCOUNTER — Observation Stay (HOSPITAL_BASED_OUTPATIENT_CLINIC_OR_DEPARTMENT_OTHER): Payer: Medicare PPO

## 2017-07-01 DIAGNOSIS — F411 Generalized anxiety disorder: Secondary | ICD-10-CM

## 2017-07-01 DIAGNOSIS — E782 Mixed hyperlipidemia: Secondary | ICD-10-CM | POA: Diagnosis not present

## 2017-07-01 DIAGNOSIS — I160 Hypertensive urgency: Secondary | ICD-10-CM

## 2017-07-01 DIAGNOSIS — I340 Nonrheumatic mitral (valve) insufficiency: Secondary | ICD-10-CM | POA: Diagnosis not present

## 2017-07-01 DIAGNOSIS — R079 Chest pain, unspecified: Secondary | ICD-10-CM | POA: Diagnosis not present

## 2017-07-01 DIAGNOSIS — R0609 Other forms of dyspnea: Secondary | ICD-10-CM | POA: Diagnosis not present

## 2017-07-01 DIAGNOSIS — K219 Gastro-esophageal reflux disease without esophagitis: Secondary | ICD-10-CM

## 2017-07-01 DIAGNOSIS — I10 Essential (primary) hypertension: Secondary | ICD-10-CM | POA: Diagnosis not present

## 2017-07-01 DIAGNOSIS — I351 Nonrheumatic aortic (valve) insufficiency: Secondary | ICD-10-CM

## 2017-07-01 DIAGNOSIS — R0789 Other chest pain: Secondary | ICD-10-CM | POA: Diagnosis not present

## 2017-07-01 LAB — ECHOCARDIOGRAM COMPLETE
AV Area VTI index: 0.99 cm2/m2
AV Mean grad: 4 mmHg
AV peak Index: 0.93
AV pk vel: 141 cm/s
AVAREAMEANV: 2.03 cm2
AVAREAMEANVIN: 1.02 cm2/m2
AVAREAVTI: 1.86 cm2
AVCELMEANRAT: 0.8
AVPG: 8 mmHg
Ao pk vel: 0.73 m/s
CHL CUP AV VEL: 1.98
CHL CUP MV DEC (S): 201
CHL CUP STROKE VOLUME: 44 mL
DOP CAL AO MEAN VELOCITY: 89 cm/s
E decel time: 201 msec
E/e' ratio: 12.67
FS: 33 % (ref 28–44)
HEIGHTINCHES: 64 in
IVS/LV PW RATIO, ED: 0.96
LA diam index: 1.8 cm/m2
LA vol A4C: 65.5 ml
LASIZE: 36 mm
LAVOL: 59.1 mL
LAVOLIN: 29.6 mL/m2
LDCA: 2.54 cm2
LEFT ATRIUM END SYS DIAM: 36 mm
LV E/e' medial: 12.67
LV E/e'average: 12.67
LV PW d: 11.5 mm — AB (ref 0.6–1.1)
LV TDI E'MEDIAL: 7.4
LV dias vol index: 33 mL/m2
LV dias vol: 65 mL (ref 46–106)
LV e' LATERAL: 8.05 cm/s
LV sys vol index: 10 mL/m2
LVOT SV: 65 mL
LVOT VTI: 25.4 cm
LVOT peak grad rest: 4 mmHg
LVOT peak vel: 103 cm/s
LVOTD: 18 mm
LVOTVTI: 0.78 cm
LVSYSVOL: 21 mL (ref 14–42)
Lateral S' vel: 12.6 cm/s
MV Peak grad: 4 mmHg
MV pk A vel: 84 m/s
MVPKEVEL: 102 m/s
Simpson's disk: 68
TAPSE: 21.3 mm
TDI e' lateral: 8.05
VTI: 32.6 cm
Valve area index: 0.99
Valve area: 1.98 cm2
WEIGHTICAEL: 3008 [oz_av]

## 2017-07-01 LAB — CBC WITH DIFFERENTIAL/PLATELET
Basophils Absolute: 0 10*3/uL (ref 0.0–0.1)
Basophils Relative: 0 %
EOS ABS: 0.3 10*3/uL (ref 0.0–0.7)
EOS PCT: 3 %
HCT: 43.2 % (ref 36.0–46.0)
HEMOGLOBIN: 13.2 g/dL (ref 12.0–15.0)
LYMPHS ABS: 3.3 10*3/uL (ref 0.7–4.0)
LYMPHS PCT: 36 %
MCH: 26.7 pg (ref 26.0–34.0)
MCHC: 30.6 g/dL (ref 30.0–36.0)
MCV: 87.3 fL (ref 78.0–100.0)
MONOS PCT: 7 %
Monocytes Absolute: 0.6 10*3/uL (ref 0.1–1.0)
Neutro Abs: 5 10*3/uL (ref 1.7–7.7)
Neutrophils Relative %: 54 %
PLATELETS: 269 10*3/uL (ref 150–400)
RBC: 4.95 MIL/uL (ref 3.87–5.11)
RDW: 15 % (ref 11.5–15.5)
WBC: 9.2 10*3/uL (ref 4.0–10.5)

## 2017-07-01 LAB — HIV ANTIBODY (ROUTINE TESTING W REFLEX): HIV SCREEN 4TH GENERATION: NONREACTIVE

## 2017-07-01 LAB — COMPREHENSIVE METABOLIC PANEL
ALBUMIN: 3.2 g/dL — AB (ref 3.5–5.0)
ALT: 18 U/L (ref 14–54)
AST: 15 U/L (ref 15–41)
Alkaline Phosphatase: 110 U/L (ref 38–126)
Anion gap: 6 (ref 5–15)
BUN: 15 mg/dL (ref 6–20)
CHLORIDE: 101 mmol/L (ref 101–111)
CO2: 33 mmol/L — AB (ref 22–32)
CREATININE: 0.82 mg/dL (ref 0.44–1.00)
Calcium: 8.8 mg/dL — ABNORMAL LOW (ref 8.9–10.3)
GFR calc Af Amer: >60 mL/min (ref >60)
GFR calc non Af Amer: >60 mL/min (ref >60)
GLUCOSE: 123 mg/dL — AB (ref 65–99)
POTASSIUM: 3.7 mmol/L (ref 3.5–5.1)
SODIUM: 140 mmol/L (ref 135–145)
Total Bilirubin: 0.2 mg/dL — ABNORMAL LOW (ref 0.3–1.2)
Total Protein: 6.2 g/dL — ABNORMAL LOW (ref 6.5–8.1)

## 2017-07-01 MED ORDER — LISINOPRIL 10 MG PO TABS: 20.0000 mg | ORAL_TABLET | Freq: Two times a day (BID) | ORAL | Status: DC

## 2017-07-01 MED ORDER — HYDRALAZINE HCL 20 MG/ML IJ SOLN
10.0000 mg | INTRAMUSCULAR | Status: DC | PRN
Start: 2017-07-01 — End: 2017-07-01
  Administered 2017-07-01: 10 mg via INTRAVENOUS
  Filled 2017-07-01: qty 1

## 2017-07-01 MED ORDER — LISINOPRIL 10 MG PO TABS
20.0000 mg | ORAL_TABLET | Freq: Every day | ORAL | Status: DC
  Administered 2017-07-01: 20 mg via ORAL

## 2017-07-01 MED ORDER — HYDRALAZINE HCL 25 MG PO TABS: 25.0000 mg | ORAL_TABLET | Freq: Three times a day (TID) | ORAL | 1 refills | Status: DC | PRN

## 2017-07-01 MED ORDER — LISINOPRIL 20 MG PO TABS: 20.0000 mg | ORAL_TABLET | Freq: Every day | ORAL | 0 refills | Status: DC

## 2017-07-01 NOTE — Consult Note (Signed)
Cardiology Consultation:   Patient ID: [name]; [id number]; 1961-03-09   Admit date: 06/30/2017 Date of Consult: 07/01/2017  Primary Care Provider: Timmothy Euler, MD Primary Cardiologist: Carlyle Dolly MD    Patient Profile:   [name] is a 56 y.o. female with a hx of HTN, chest pain with non-ischemic Lexiscan in 07/2016, orthostatic hypotension (diuretic stopped on previous office appt), hyperlipidemia, ITP, with multiple medical problems who is being seen today for the evaluation of chest pain  at the request of Dr. Roderic Palau, Hospitalist.   History of Present Illness:   [name] presented to the emergency room with complaints of progressive chest pain. She describes the pain as constant, moderate in intensity, ache, with reported hypertension at home. She states that she stopped taking her antihypertensive medication in March of this year as her blood pressure was well-controlled. The patient states over the last month she and her husband have been under a lot of stress.  The patient called EMS 2 days ago because she was having chest pain and her blood pressure was elevated. EKG per EMS reportedly from the patient, was normal. Her blood pressure was very elevated. She began taking her antihypertensives again which is lisinopril 10 mg daily, but she increased to 20 mg daily on her own. She continued to check her blood pressure and it remained elevated. She continued to have chest pressure. She took an additional 40 mg of Lisinopril around 3:30 AM early morning hours prior to calling back EMS due to recurrent chest pain and hypertension. This time she agreed to come to the emergency room.  On arrival to ER, she was found to be hypertensive with a blood pressure 156/96 heart rate 67 O2 sat 96% respirations 14 she was afebrile. Throughout ER visit blood pressure continued  to rise, as high as 175/85. EKG revealed normal sinus rhythm heart rate 64 bpm with poor R-wave  progression, no acute ST-T wave abnormalities. Troponin negative 3. Other pertinent labs included potassium 3.7 creatinine 0.82. Platelets were 269, no evidence of thrombocytopenia, hemoglobin 13.2 hematocrit 43.2. No leukocytosis. D-dimer was elevated at 0.87. CT scan was negative for PE, mild atherosclerotic calcifications were seen along the aorta, no evidence of CHF or pneumonia or effusion. Chest x-ray was negative for acute cardiopulmonary disease.  The patient was treated with aspirin 325 mg and Xanax and admitted to rule out ACS. Hospitalst initially ordered stress Myoview, however this was canceled in the setting of hypertension with blood pressure 195/103. Review of home medications reveals that she is on lisinopril 10 mg daily and atenolol 50 mg daily. She has subsequently been restarted on both with increase in dose of lisinopril to 20 mg daily.    Currently, the patient continues to feel some discomfort in her chest. She has been up walking in the hallway and when she gets to the end of the first hallway she begins to feel chest pressure and a headache. When she lies back down in bed she feels better but continues to feel a dull pressure on the left upper side of her chest and in her left scapula area.  Past Medical History:  Diagnosis Date  . Abdominal pain   . Anxiety   . Arthritis   . Blood in stool   . Breast changes, fibrocystic   . Bruises easily   . Clotting disorder (HCC)     itp , none since 1976, no current hematologist  . Constipation   . DDD (  degenerative disc disease), cervical    with lumbar issues  . Depression   . Generalized headaches   . GERD (gastroesophageal reflux disease)   . Hemorrhoid   . Hyperlipidemia   . Hypertension   . Idiopathic thrombocytopenia ([place])    as child  . Leg swelling    both ankles  . Liver lesion   . Nasal congestion   . Nausea & vomiting   . Neuromuscular disorder ([place])    back  injury  . Neuromuscular disorder ([place])    3  neck surgeries,neck fusion.pin,plates,screws  . Osteoporosis   . Panic attacks    pt also  has germaphobia  . Rectal bleeding   . Seizures ([place]) 1998   stress induced, one time, none since, no seizure meds  . Seizures ([place])    Stated she had 2 weeks ago,it was like a transient,stare.  . Sleep apnea    STOPBANG=4  . Spleen enlarged 1976, none since   r/t idiopathic thrombocytopenia  . Trouble swallowing   . Unintentional weight loss   . Weakness    weakness varies    Past Surgical History:  Procedure Laterality Date  . ABDOMINAL HYSTERECTOMY  1997  . BACK SURGERY    . CERVICAL SPINE SURGERY  2005, 2007, 2010   x3, fusion done,plates and screws present  . [place]  . CHOLECYSTECTOMY  03/14/2012   Procedure: LAPAROSCOPIC CHOLECYSTECTOMY;  Surgeon: Stark Klein, MD;  Location: [facility];  Service: General;  Laterality: N/A;  . colonscopy and esophagogastrodudononescopy  02-04-12  . [place] SURGERY  11/2014     Home Medications:  Prior to Admission medications   Medication Sig Start Date End Date Taking? Authorizing Provider  ALPRAZolam Duanne Moron) 0.5 MG tablet TAKE 1 TABLET BY MOUTH TWICE DAILY AS NEEDED 03/16/17  Yes Hawks, Christy A, FNP  atenolol (TENORMIN) 50 MG tablet TAKE 1 TABLET BY MOUTH ONCE DAILY WITH BREAKFAST 04/22/17  Yes Hawks, Christy A, FNP  atorvastatin (LIPITOR) 20 MG tablet Take 1 tablet (20 mg total) by mouth daily. 07/17/16  Yes Timmothy Euler, MD  citalopram (CELEXA) 40 MG tablet Take 1 tablet (40 mg total) by mouth daily with breakfast. 04/15/17  Yes Timmothy Euler, MD  diphenhydrAMINE (BENADRYL) 25 mg capsule Take 25 mg by mouth daily as needed for allergies.   Yes [provider]  gabapentin (NEURONTIN) 300 MG capsule Take 600 mg by mouth 3 (three) times daily.  09/28/13  Yes [provider]  HYDROcodone-acetaminophen (NORCO) 10-325 MG tablet Take 1 tablet by mouth every 6 (six) hours as needed for moderate pain or severe  pain.  07/16/16  Yes [provider]  lisinopril (PRINIVIL,ZESTRIL) 10 MG tablet Take 1 tablet (10 mg total) by mouth daily. Patient taking differently: Take 10 mg by mouth daily. Took 20mg  this morning 03/09/17  Yes Hawks, Christy A, FNP  magic mouthwash SOLN Take 5 mLs by mouth 4 (four) times daily. 06/24/17  Yes Timmothy Euler, MD  metoCLOPramide (REGLAN) 10 MG tablet Take 1 tablet (10 mg total) by mouth daily with breakfast. 01/04/17  Yes Milus Banister, MD  ondansetron [facility]) 4 MG tablet Take 1 tablet by mouth daily as needed for nausea or vomiting.  04/02/16  Yes [provider]  pantoprazole (PROTONIX) 40 MG tablet TAKE ONE TABLET BY MOUTH TWICE DAILY 11/27/16  Yes Milus Banister, MD  traZODone (DESYREL) 100 MG tablet Take 1 tablet (100 mg total) by mouth  at bedtime. 04/15/17  Yes Timmothy Euler, MD  sulfamethoxazole-trimethoprim (BACTRIM DS) 800-160 MG tablet Take 1 tablet by mouth 2 (two) times daily. Patient not taking: Reported on 06/30/2017 03/09/17   Sharion Balloon, FNP  Vitamin D, Ergocalciferol, (DRISDOL) 50000 units CAPS capsule Take 1 capsule (50,000 Units total) by mouth every 7 (seven) days. Patient not taking: Reported on 06/30/2017 07/03/16   Sharion Balloon, FNP    Inpatient Medications: Scheduled Meds: . aspirin  325 mg Oral Once  . aspirin  325 mg Oral Daily  . atenolol  50 mg Oral Daily  . atorvastatin  20 mg Oral q1800  . citalopram  40 mg Oral Q breakfast  . enoxaparin (LOVENOX) injection  40 mg Subcutaneous Q24H  . gabapentin  600 mg Oral TID  . lisinopril  20 mg Oral Daily  . magic mouthwash  5 mL Oral QID  . metoCLOPramide  10 mg Oral Q breakfast  . pantoprazole  40 mg Oral BID  . traZODone  100 mg Oral QHS   Continuous Infusions:  PRN Meds: acetaminophen, ALPRAZolam, diphenhydrAMINE, hydrALAZINE, HYDROcodone-acetaminophen, ondansetron (ZOFRAN) IV, ondansetron  Allergies:    Allergies  Allergen Reactions  . Nsaids Nausea Only     Abdominal pain    Social History:   Social History   Social History  . Marital status: Married    Spouse name: N/A  . Number of children: 3  . Years of education: N/A   Occupational History  . Diabled Unemployed   Social History Main Topics  . Smoking status: Current Every Day Smoker    Packs/day: 1.00    Years: 5.00    Types: Cigarettes    Start date: 12/20/2009  . Smokeless tobacco: Never Used     Comment: VAPOR- OCCASIONALLY  . Alcohol use No  . Drug use: No  . Sexual activity: Yes   Other Topics Concern  . Not on file   Social History Narrative   She lives with her boyfriend.  She used to smoke, quit May 2013, she is not drinking. She has 3 children    Family History:    Family History  Problem Relation Age of Onset  . Hypertension Mother   . Heart disease Mother   . Lung cancer Father   . Cancer Father        lung  . Colon cancer Paternal Grandmother   . Cancer Paternal Grandmother        colon  . Heart disease Maternal Grandmother   . Diabetes Sister      ROS:  Please see the history of present illness.  ROS  All other ROS reviewed and negative.     Physical Exam/Data:   Vitals:   07/01/17 0847 07/01/17 0849 07/01/17 0931 07/01/17 1007  BP: (!) 180/93 (!) 180/93 (!) 195/103 (!) 159/82  Pulse:  74  68  Resp:  20    Temp:  98 F (36.7 C)    TempSrc:      SpO2:  98%    Weight:      Height:        Intake/Output Summary (Last 24 hours) at 07/01/17 1149 Last data filed at 07/01/17 0900  Gross per 24 hour  Intake              810 ml  Output                0 ml  Net  810 ml   Filed Weights   06/30/17 0820  Weight: 188 lb (85.3 kg)   Body mass index is 32.27 kg/m.  General:  Well nourished, well developed, in no acute distress HEENT: normal Lymph: no adenopathy Neck: no JVD Endocrine:  No thryomegaly Vascular: No carotid bruits; FA pulses 2+ bilaterally without bruits  Cardiac:  normal S1, S2;Soft S4 murmur RRR; no  murmur Lungs:  Clear to auscultation bilaterally, no wheezing, rhonchi or rales  Abd: soft, nontender, no hepatomegaly  Ext: no edema Musculoskeletal:  No deformities, BUE and BLE strength normal and equal Skin: warm and dry  Neuro:  CNs 2-12 intact, no focal abnormalities noted Psych:  Normal affect   EKG:  The EKG was personally reviewed and demonstrates: Normal sinus rhythm with poor R-wave progression Telemetry:  Telemetry was personally reviewed and demonstrates:  Normal sinus rhythm heart rate in the 60s  Relevant CV Studies: NM stress test 08/06/2016  Study Result    No diagnostic ST segment changes with Lexiscan infusion. Patient did develop chest pain and shortness of breath 10-15 minutes after the completion of the study. This was not associated with any acute ST segment changes by repeat ECG, and blood pressure was stable. Symptoms resolved spontaneously.  Small, mild intensity, partially reversible mid to apical anterior defect suggestive of variable breast attenuation artifact, less likely mild region of ischemia.  This is a low risk study.  Nuclear stress EF: 82%.   Echocardiogram 07/10/2016  Left ventricle: The cavity size was normal. Wall thickness was   increased in a pattern of mild LVH. Systolic function was   vigorous. The estimated ejection fraction was in the range of 65%   to 70%. Left ventricular diastolic function parameters were   normal. - Mitral valve: There was mild regurgitation. - Pericardium, extracardiac: A trivial pericardial effusion was   identified.  Laboratory Data:  Chemistry Recent Labs Lab 06/30/17 0825 06/30/17 1612 07/01/17 0559  NA 140  --  140  K 4.0  --  3.7  CL 103  --  101  CO2 32  --  33*  GLUCOSE 125*  --  123*  BUN 16  --  15  CREATININE 0.82 0.79 0.82  CALCIUM 8.9  --  8.8*  GFRNONAA >60 >60 >60  GFRAA >60 >60 >60  ANIONGAP 5  --  6     Recent Labs Lab 07/01/17 0559  PROT 6.2*  ALBUMIN 3.2*  AST 15  ALT 18   ALKPHOS 110  BILITOT 0.2*   Hematology Recent Labs Lab 06/30/17 0825 07/01/17 0559  WBC 11.3* 9.2  RBC 4.87 4.95  HGB 13.2 13.2  HCT 42.2 43.2  MCV 86.7 87.3  MCH 27.1 26.7  MCHC 31.3 30.6  RDW 15.0 15.0  PLT 278 269   Cardiac Enzymes Recent Labs Lab 06/30/17 1612 06/30/17 1848 06/30/17 2213  TROPONINI <0.03 <0.03 <0.03    Recent Labs Lab 06/30/17 0839 06/30/17 1230  TROPIPOC 0.01 0.02    DDimer  Recent Labs Lab 06/30/17 0825  DDIMER 0.87*    Radiology/Studies:  Dg Chest 2 View  Result Date: 06/30/2017 CLINICAL DATA:  Mid sternal chest pain radiating to the back beginning yesterday. EXAM: CHEST  2 VIEW COMPARISON:  07/09/2016 FINDINGS: Heart upper limits of normal in size. Slightly prominent chronic interstitial lung markings. Pulmonary vascularity within normal limits. No infiltrate, mass, effusion or collapse. No acute bone finding. Previous cervicothoracic fusion. IMPRESSION: No active cardiopulmonary disease. Electronically Signed   By:  Nelson Chimes M.D.   On: 06/30/2017 08:58   Ct Angio Chest Pe W And/or Wo Contrast  Result Date: 06/30/2017 CLINICAL DATA:  Suspected pulmonary embolism. Midsternal chest pain radiating to the back. EXAM: CT ANGIOGRAPHY CHEST WITH CONTRAST TECHNIQUE: Multidetector CT imaging of the chest was performed using the standard protocol during bolus administration of intravenous contrast. Multiplanar CT image reconstructions and MIPs were obtained to evaluate the vascular anatomy. CONTRAST:  100 cc Isovue 370 intravenous COMPARISON:  03/18/2012 FINDINGS: Cardiovascular: Satisfactory opacification of the pulmonary arteries to the segmental level. No evidence of pulmonary embolism. Cardiomegaly. No pericardial effusion. Mild atherosclerotic calcifications seen along the aorta. No acute aortic finding. Mediastinum/Nodes: Negative for mass or adenopathy. Lungs/Pleura: There is no edema, consolidation, effusion, or pneumothorax. Upper Abdomen: No  acute finding. Musculoskeletal: No acute or aggressive finding. Cervicothoracic fusion. Review of the MIP images confirms the above findings. IMPRESSION: Negative for pulmonary embolism or other acute finding. Electronically Signed   By: Monte Fantasia M.D.   On: 06/30/2017 11:36    Assessment and Plan:   1. Chest pain: EKG does not reveal acute ST-T wave abnormalities, troponin is negative 3, arguing against ACS. Can consider repeat stress test when blood pressure is better controlled either as inpatient or outpatient.  2. Uncontrolled hypertension: Multifactorial in the setting of medical noncompliance with lisinopril since March 2018. She states that she had been feeling better and her blood pressure was controlled so she stopped it on her own. She had been on lisinopril HCTZ and had had orthostatic hypotension and therefore HCTZ portion was discontinued by cardiologist in January 2018. She continued to take atenolol.  She has noticed increasing blood pressure and chest pain earlier this week, felt it was related to stress. However when she continued to take her blood pressure and feel chest discomfort she noticed that her blood pressure remained elevated. She began to take lisinopril 20 mg daily (higher dose than 10 mg a day that she was prescribed), and continued to see blood pressure was elevated. Chest pressure continued along with headache associated with the elevated blood pressure. After calling EMS for second time she agreed to come to ER.  Blood pressure is not well controlled currently. She has had significant elevation since admission and has had moderate improvement with lisinopril 20 mg daily along with atenolol. We'll repeat echocardiogram for changes in LV function. Creatinine is 0.82. Will increase lisinopril to 20 mg twice a day. She may be a candidate for a sleep study to evaluate for obstructive sleep apnea secondary to body habitus.  3. Hyperlipidemia: Continue atorvastatin.  We'll check fasting lipids and LFTs in a.m. to evaluate control.  4. History of GERD: Is on PPI and H2 blocker. Check magnesium.   Signed, Jory Sims, NP  07/01/2017 11:49 AM   The patient was seen and examined, and I agree with the history, physical exam, assessment and plan as documented above, with modifications as noted below. I have also personally reviewed all relevant documentation, old records, labs, and both radiographic and cardiovascular studies. I have also independently interpreted old and new ECG's.  56 yr old obese female with HTN admitted for chest pain and hypertensive urgency with SBP's in 190-200 range. Troponins are normal. ECG is unremarkable. CT angiogram negative for PE. No mention of coronary artery calcifications.  She was markedly hypertensive this morning, for which reason I cancelled stress test.  Her pain is still present but much less severe. She does have exertional dyspnea.  Unremarkable nuclear stress test in 07/2016.  Recommendations: I would aim to control BP as this is contributing to symptomatology. I would consider an outpatient stress test if echocardiogram demonstrates normal LV systolic function and regional wall motion and once her BP is controlled, but will defer to Dr. Harl Bowie, her primary cardiologist.  Kate Sable, MD, [facility]  07/01/2017 12:56 PM

## 2017-07-01 NOTE — Discharge Summary (Signed)
Physician Discharge Summary  [name] ION:[id number] DOB: July 20, 1961 DOA: 06/30/2017  PCP: Timmothy Euler, MD  Admit date: 06/30/2017 Discharge date: 07/01/2017  Admitted From: home Disposition:  home  Recommendations for Outpatient Follow-up:  1. Follow up with PCP in 1-2 weeks 2. Please obtain BMP/CBC in one week 3. Patient will follow up with cardiology to consider outpatient stress testing and further management of blood pressure  Home Health: Equipment/Devices:  Discharge Condition: stable CODE STATUS: full code Diet recommendation: Heart Healthy  Brief/Interim Summary: 56 y/o female admitted to the hospital with chest pain and uncontrolled hypertension. She was admitted to the hospital and ruled out for ACS with negative cardiac markers and no acute changes on EKG. She was seen by cardiology who felt that her symptoms of chest discomfort, were likely secondary to uncontrolled blood pressure. Patient takes atenolol and lisinopril. She reports being on lisinopril 20mg  daily in the past, but this was decreased to 10mg  daily when she developed hypotension. She had done fairly well on this dose and then decided to take herself off of it. Her blood pressure began to rise, so she restarted on 20mg  daily approximately 2 days ago. Plan discussed with Dr. Bronson Ing and patient will be restarted on lisinopril 20mg  daily, so as not to cause hypotension again. Continue on current dose of lisinopril. She has a BP cuff at home and routinely checks her blood pressure. Will give her hydralazine to be used prn for high blood pressure. She will follow up with cardiology as an outpatient and will be considered for outpatient stress testing. She is otherwise stable for discharge.  Discharge Diagnoses:  Active Problems:   GAD (generalized anxiety disorder)   Essential hypertension   GERD (gastroesophageal reflux disease)   Chest pain    Discharge Instructions  Discharge Instructions     Diet - low sodium heart healthy    Complete by:  As directed    Increase activity slowly    Complete by:  As directed      Allergies as of 07/01/2017      Reactions   Nsaids Nausea Only   Abdominal pain      Medication List    STOP taking these medications   sulfamethoxazole-trimethoprim 800-160 MG tablet Commonly known as:  BACTRIM DS   Vitamin D (Ergocalciferol) 50000 units Caps capsule Commonly known as:  DRISDOL     TAKE these medications   ALPRAZolam 0.5 MG tablet Commonly known as:  XANAX TAKE 1 TABLET BY MOUTH TWICE DAILY AS NEEDED   atenolol 50 MG tablet Commonly known as:  TENORMIN TAKE 1 TABLET BY MOUTH ONCE DAILY WITH BREAKFAST   atorvastatin 20 MG tablet Commonly known as:  LIPITOR Take 1 tablet (20 mg total) by mouth daily.   citalopram 40 MG tablet Commonly known as:  CELEXA Take 1 tablet (40 mg total) by mouth daily with breakfast.   diphenhydrAMINE 25 mg capsule Commonly known as:  BENADRYL Take 25 mg by mouth daily as needed for allergies.   gabapentin 300 MG capsule Commonly known as:  NEURONTIN Take 600 mg by mouth 3 (three) times daily.   hydrALAZINE 25 MG tablet Commonly known as:  APRESOLINE Take 1 tablet (25 mg total) by mouth 3 (three) times daily as needed (blood pressure greater than 170/100).   HYDROcodone-acetaminophen 10-325 MG tablet Commonly known as:  NORCO Take 1 tablet by mouth every 6 (six) hours as needed for moderate pain or severe pain.   lisinopril 20  MG tablet Commonly known as:  PRINIVIL,ZESTRIL Take 1 tablet (20 mg total) by mouth daily. What changed:  medication strength  how much to take   magic mouthwash Soln Take 5 mLs by mouth 4 (four) times daily.   metoCLOPramide 10 MG tablet Commonly known as:  REGLAN Take 1 tablet (10 mg total) by mouth daily with breakfast.   ondansetron 4 MG tablet Commonly known as:  ZOFRAN Take 1 tablet by mouth daily as needed for nausea or vomiting.   pantoprazole 40 MG  tablet Commonly known as:  PROTONIX TAKE ONE TABLET BY MOUTH TWICE DAILY   traZODone 100 MG tablet Commonly known as:  DESYREL Take 1 tablet (100 mg total) by mouth at bedtime.            Discharge Care Instructions        Start     Ordered   07/01/17 0000  lisinopril (PRINIVIL,ZESTRIL) 20 MG tablet  Daily     07/01/17 1505   07/01/17 0000  hydrALAZINE (APRESOLINE) 25 MG tablet  3 times daily PRN     07/01/17 1505   07/01/17 0000  Increase activity slowly     07/01/17 1505   07/01/17 0000  Diet - low sodium heart healthy     07/01/17 1505      Allergies  Allergen Reactions  . Nsaids Nausea Only    Abdominal pain    Consultations:  cardiology   Procedures/Studies: Dg Chest 2 View  Result Date: 06/30/2017 CLINICAL DATA:  Mid sternal chest pain radiating to the back beginning yesterday. EXAM: CHEST  2 VIEW COMPARISON:  07/09/2016 FINDINGS: Heart upper limits of normal in size. Slightly prominent chronic interstitial lung markings. Pulmonary vascularity within normal limits. No infiltrate, mass, effusion or collapse. No acute bone finding. Previous cervicothoracic fusion. IMPRESSION: No active cardiopulmonary disease. Electronically Signed   By: Nelson Chimes M.D.   On: 06/30/2017 08:58   Ct Angio Chest Pe W And/or Wo Contrast  Result Date: 06/30/2017 CLINICAL DATA:  Suspected pulmonary embolism. Midsternal chest pain radiating to the back. EXAM: CT ANGIOGRAPHY CHEST WITH CONTRAST TECHNIQUE: Multidetector CT imaging of the chest was performed using the standard protocol during bolus administration of intravenous contrast. Multiplanar CT image reconstructions and MIPs were obtained to evaluate the vascular anatomy. CONTRAST:  100 cc Isovue 370 intravenous COMPARISON:  03/18/2012 FINDINGS: Cardiovascular: Satisfactory opacification of the pulmonary arteries to the segmental level. No evidence of pulmonary embolism. Cardiomegaly. No pericardial effusion. Mild atherosclerotic  calcifications seen along the aorta. No acute aortic finding. Mediastinum/Nodes: Negative for mass or adenopathy. Lungs/Pleura: There is no edema, consolidation, effusion, or pneumothorax. Upper Abdomen: No acute finding. Musculoskeletal: No acute or aggressive finding. Cervicothoracic fusion. Review of the MIP images confirms the above findings. IMPRESSION: Negative for pulmonary embolism or other acute finding. Electronically Signed   By: Monte Fantasia M.D.   On: 06/30/2017 11:36      Subjective: Chest discomfort is better. No shortness of breath  Discharge Exam: Vitals:   07/01/17 1049 07/01/17 1332  BP: (!) 177/87 (!) 181/96  Pulse: 71 66  Resp: 20 19  Temp: 98.7 F (37.1 C) 98.9 F (37.2 C)  SpO2: 99% 98%   Vitals:   07/01/17 0931 07/01/17 1007 07/01/17 1049 07/01/17 1332  BP: (!) 195/103 (!) 159/82 (!) 177/87 (!) 181/96  Pulse:  68 71 66  Resp:   20 19  Temp:   98.7 F (37.1 C) 98.9 F (37.2 C)  TempSrc:  Oral Oral  SpO2:   99% 98%  Weight:      Height:        General: Pt is alert, awake, not in acute distress Cardiovascular: RRR, S1/S2 +, no rubs, no gallops Respiratory: CTA bilaterally, no wheezing, no rhonchi Abdominal: Soft, NT, ND, bowel sounds + Extremities: no edema, no cyanosis    The results of significant diagnostics from this hospitalization (including imaging, microbiology, ancillary and laboratory) are listed below for reference.     Microbiology: No results found for this or any previous visit (from the past 240 hour(s)).   Labs: BNP (last 3 results) No results for input(s): BNP in the last 8760 hours. Basic Metabolic Panel:  Recent Labs Lab 06/30/17 0825 06/30/17 1612 07/01/17 0559  NA 140  --  140  K 4.0  --  3.7  CL 103  --  101  CO2 32  --  33*  GLUCOSE 125*  --  123*  BUN 16  --  15  CREATININE 0.82 0.79 0.82  CALCIUM 8.9  --  8.8*   Liver Function Tests:  Recent Labs Lab 07/01/17 0559  AST 15  ALT 18  ALKPHOS 110   BILITOT 0.2*  PROT 6.2*  ALBUMIN 3.2*   No results for input(s): LIPASE, AMYLASE in the last 168 hours. No results for input(s): AMMONIA in the last 168 hours. CBC:  Recent Labs Lab 06/30/17 0825 07/01/17 0559  WBC 11.3* 9.2  NEUTROABS  --  5.0  HGB 13.2 13.2  HCT 42.2 43.2  MCV 86.7 87.3  PLT 278 269   Cardiac Enzymes:  Recent Labs Lab 06/30/17 1612 06/30/17 1848 06/30/17 2213  TROPONINI <0.03 <0.03 <0.03   BNP: Invalid input(s): POCBNP CBG: No results for input(s): GLUCAP in the last 168 hours. D-Dimer  Recent Labs  06/30/17 0825  DDIMER 0.87*   Hgb A1c No results for input(s): HGBA1C in the last 72 hours. Lipid Profile No results for input(s): CHOL, HDL, LDLCALC, TRIG, CHOLHDL, LDLDIRECT in the last 72 hours. Thyroid function studies No results for input(s): TSH, T4TOTAL, T3FREE, THYROIDAB in the last 72 hours.  Invalid input(s): FREET3 Anemia work up No results for input(s): VITAMINB12, FOLATE, FERRITIN, TIBC, IRON, RETICCTPCT in the last 72 hours. Urinalysis    Component Value Date/Time   COLORURINE YELLOW 06/30/2017 0950   APPEARANCEUR HAZY (A) 06/30/2017 0950   APPEARANCEUR Clear 03/05/2017 1501   LABSPEC 1.031 (H) 06/30/2017 0950   PHURINE 7.0 06/30/2017 0950   GLUCOSEU NEGATIVE 06/30/2017 0950   HGBUR NEGATIVE 06/30/2017 0950   BILIRUBINUR MODERATE (A) 06/30/2017 0950   BILIRUBINUR Negative 03/05/2017 1501   KETONESUR NEGATIVE 06/30/2017 0950   PROTEINUR 30 (A) 06/30/2017 0950   UROBILINOGEN negative 03/21/2015 1636   UROBILINOGEN 0.2 03/16/2012 0946   NITRITE NEGATIVE 06/30/2017 0950   LEUKOCYTESUR NEGATIVE 06/30/2017 0950   LEUKOCYTESUR Trace (A) 03/05/2017 1501   Sepsis Labs Invalid input(s): PROCALCITONIN,  WBC,  LACTICIDVEN Microbiology No results found for this or any previous visit (from the past 240 hour(s)).   Time coordinating discharge: Over 30 minutes  SIGNED:   Kathie Dike, MD  [facility] 07/01/2017,  3:11 PM Pager   If 7PM-7AM, please contact night-coverage [email or web address] Password [email or web address]

## 2017-07-01 NOTE — Care Management Obs Status (Signed)
[facility] NOTIFICATION   Patient Details  Name: [name] MRN: [id number] Date of Birth: 01-04-1961   Medicare Observation Status Notification Given:  Yes    Sherald Barge, RN 07/01/2017, 9:47 AM

## 2017-07-01 NOTE — Progress Notes (Signed)
*  PRELIMINARY RESULTS* Echocardiogram 2D Echocardiogram has been performed.  [name] 07/01/2017, 3:23 PM

## 2017-07-01 NOTE — Progress Notes (Signed)
Patient IV removed, Telemetry removed, tolerated well. Patient given discharge instructions at bedside.  

## 2017-07-15 ENCOUNTER — Other Ambulatory Visit: Payer: Self-pay | Admitting: Family

## 2017-07-15 NOTE — Telephone Encounter (Signed)
Rx called in. Patient aware.  

## 2017-07-16 ENCOUNTER — Ambulatory Visit: Payer: Medicare PPO | Admitting: Adult Health

## 2017-07-27 ENCOUNTER — Ambulatory Visit: Payer: Medicare PPO | Admitting: Adult Health

## 2017-07-28 NOTE — Progress Notes (Signed)
Cardiology Office Note   Date:  07/29/2017   ID:  [name], DOB 1961/07/20, MRN [id number]  PCP:  Timmothy Euler, MD  Cardiologist:  Dr. Carlyle Dolly    Chief Complaint  Patient presents with  . Hypertension  . Chest Pain  . Hospitalization Follow-up      History of Present Illness: [name] is a 56 y.o. female who presents for post hospital follow up after admission for chest pain, with known history of HTN, Hyperlipidemia, and multiple medical problems. She was found to have uncontrolled hypertension and was treated for this with resolution of chest pain. She was ruled out for ACS. She was restarted on lisinopril 20 mg daily and continued on atenolol and statin therapy.  She comes today with recurrent chest pain. She does not have NTG to use for this. She states that stress increases her incidence of chest pain. She is under a lot of it with family issues.   She is medically compliant. She unfortunately continues to smoke.   Past Medical History:  Diagnosis Date  . Abdominal pain   . Anxiety   . Arthritis   . Blood in stool   . Breast changes, fibrocystic   . Bruises easily   . Clotting disorder (HCC)     itp , none since 1976, no current hematologist  . Constipation   . DDD (degenerative disc disease), cervical    with lumbar issues  . Depression   . Generalized headaches   . GERD (gastroesophageal reflux disease)   . Hemorrhoid   . Hyperlipidemia   . Hypertension   . Idiopathic thrombocytopenia ([place])    as child  . Leg swelling    both ankles  . Liver lesion   . Nasal congestion   . Nausea & vomiting   . Neuromuscular disorder ([place])    back  injury  . Neuromuscular disorder ([place])    3 neck surgeries,neck fusion.pin,plates,screws  . Osteoporosis   . Panic attacks    pt also  has germaphobia  . Rectal bleeding   . Seizures ([place]) 1998   stress induced, one time, none since, no seizure meds  . Seizures ([place])    Stated she had 2 weeks  ago,it was like a transient,stare.  . Sleep apnea    STOPBANG=4  . Spleen enlarged 1976, none since   r/t idiopathic thrombocytopenia  . Trouble swallowing   . Unintentional weight loss   . Weakness    weakness varies    Past Surgical History:  Procedure Laterality Date  . ABDOMINAL HYSTERECTOMY  1997  . BACK SURGERY    . CERVICAL SPINE SURGERY  2005, 2007, 2010   x3, fusion done,plates and screws present  . [place]  . CHOLECYSTECTOMY  03/14/2012   Procedure: LAPAROSCOPIC CHOLECYSTECTOMY;  Surgeon: Stark Klein, MD;  Location: [facility]S;  Service: General;  Laterality: N/A;  . colonscopy and esophagogastrodudononescopy  02-04-12  . LUMBAR DISC SURGERY  11/2014     Current Outpatient Prescriptions  Medication Sig Dispense Refill  . ALPRAZolam (XANAX) 0.5 MG tablet Take 1 Tablet by mouth 2 times a day as needed 60 tablet 0  . atenolol (TENORMIN) 50 MG tablet TAKE 1 TABLET BY MOUTH ONCE DAILY WITH BREAKFAST 90 tablet 1  . citalopram (CELEXA) 40 MG tablet Take 1 tablet (40 mg total) by mouth daily with breakfast. 90 tablet 1  . diphenhydrAMINE (BENADRYL) 25 mg capsule Take 25 mg by mouth daily  as needed for allergies.    Marland Kitchen gabapentin (NEURONTIN) 300 MG capsule Take 600 mg by mouth 3 (three) times daily.     . hydrALAZINE (APRESOLINE) 25 MG tablet Take 1 tablet (25 mg total) by mouth 3 (three) times daily as needed (blood pressure greater than 170/100). 90 tablet 1  . HYDROcodone-acetaminophen (NORCO) 10-325 MG tablet Take 1 tablet by mouth every 6 (six) hours as needed for moderate pain or severe pain.     Marland Kitchen lisinopril (PRINIVIL,ZESTRIL) 20 MG tablet Take 1 tablet (20 mg total) by mouth daily. 30 tablet 0  . magic mouthwash SOLN Take 5 mLs by mouth 4 (four) times daily. 100 mL 0  . metoCLOPramide (REGLAN) 10 MG tablet Take 1 tablet (10 mg total) by mouth daily with breakfast. 30 tablet 2  . ondansetron (ZOFRAN) 4 MG tablet Take 1 tablet by mouth daily as needed for  nausea or vomiting.     . pantoprazole (PROTONIX) 40 MG tablet TAKE ONE TABLET BY MOUTH TWICE DAILY 180 tablet 3  . traZODone (DESYREL) 100 MG tablet Take 1 tablet (100 mg total) by mouth at bedtime. 90 tablet 1   No current facility-administered medications for this visit.     Allergies:   Nsaids    Social History:  The patient  reports that she has been smoking Cigarettes.  She started smoking about 7 years ago. She has a 5.00 pack-year smoking history. She has never used smokeless tobacco. She reports that she does not drink alcohol or use drugs.   Family History:  The patient's family history includes Cancer in her father and paternal grandmother; Colon cancer in her paternal grandmother; Diabetes in her sister; Heart disease in her maternal grandmother and mother; Hypertension in her mother; Lung cancer in her father.    ROS: All other systems are reviewed and negative. Unless otherwise mentioned in H&P    PHYSICAL EXAM: VS:  BP 126/84   Pulse 67   Ht 5\' 4"  (1.626 m)   Wt 190 lb (86.2 kg)   SpO2 97%   BMI 32.61 kg/m  , BMI Body mass index is 32.61 kg/m. GEN: Well nourished, well developed, in no acute distress  HEENT: normal  Neck: no JVD, carotid bruits, or masses Cardiac: RRR; no murmurs, rubs, or gallops,no edema  Respiratory:  clear to auscultation bilaterally, normal work of breathing GI: soft, nontender, nondistended, + BS MS: no deformity or atrophy  Skin: warm and dry, no rash Neuro:  Strength and sensation are intact Psych: euthymic mood, full affect  Recent Labs: 31-Jul-2017: ALT 18; BUN 15; Creatinine, Ser 0.82; Hemoglobin 13.2; Platelets 269; Potassium 3.7; Sodium 140    Lipid Panel    Component Value Date/Time   CHOL 225 (H) 10/06/2016 1213   TRIG 284 (H) 10/06/2016 1213   HDL 40 10/06/2016 1213   CHOLHDL 5.6 (H) 10/06/2016 1213   CHOLHDL 4.1 Ratio 01/25/2007 1851   VLDL 32 01/25/2007 1851   LDLCALC 128 (H) 10/06/2016 1213      Wt Readings from  Last 3 Encounters:  07/29/17 190 lb (86.2 kg)  06/30/17 188 lb (85.3 kg)  03/09/17 198 lb 6.4 oz (90 kg)      Other studies Reviewed: Echocardiogram 07-31-17 Left ventricle: The cavity size was normal. Wall thickness was   increased in a pattern of mild LVH. Systolic function was normal.   The estimated ejection fraction was in the range of 60% to 65%.   Wall motion was normal; there  were no regional wall motion   abnormalities. Features are consistent with a pseudonormal left   ventricular filling pattern, with concomitant abnormal relaxation   and increased filling pressure (grade 2 diastolic dysfunction).   Indeterminate filling pressures. - Aortic valve: There was mild regurgitation. - Mitral valve: There was mild regurgitation. - Systemic veins: Dilated IVC with normal respiratory variation.   Estimated CVP 8 mmHg.  NM Stress Test 08/06/2016 Study Result    No diagnostic ST segment changes with Lexiscan infusion. Patient did develop chest pain and shortness of breath 10-15 minutes after the completion of the study. This was not associated with any acute ST segment changes by repeat ECG, and blood pressure was stable. Symptoms resolved spontaneously.  Small, mild intensity, partially reversible mid to apical anterior defect suggestive of variable breast attenuation artifact, less likely mild region of ischemia.  This is a low risk study.  Nuclear stress EF: 82%.     ASSESSMENT AND PLAN:  1. Recurrent chest pain: Was ruled out for ACS during hospitalization. She will be given NTG sublingual prn chest pain. I will repeat stress test for evaluation of new ischemia. If she has to take NTG more than 3 times a week, may need to consider long acting nitrates, and possible cardiac cath for definitive evaluation of coronary anatomy. CVRF include: FH, Tobacco abuse, HTN, Hyperlipidemia, significant stress.   2. Hypertension:  She remains on hydralazine prn, lisinopril, and atenolol.  BP is currently well controlled.   3. Ongoing tobacco abuse: She is advised to quit to reduce her risk for CAD and lung disease. She verbalizes understanding.   4.  Psychosocial Stress: She is advised on increasing exercise and possible counseling to assist her with coping skills.    Current medicines are reviewed at length with the patient today.    Labs/ tests ordered today include: Lexiscan stress test.  [name]. West Pugh, ANP, AACC   07/29/2017 2:35 PM    [facility] [address], [place],  [address] Phone: [phone]; Fax: [phone]

## 2017-07-29 ENCOUNTER — Ambulatory Visit (INDEPENDENT_AMBULATORY_CARE_PROVIDER_SITE_OTHER): Payer: Medicare PPO | Admitting: Adult Health

## 2017-07-29 ENCOUNTER — Encounter: Payer: Self-pay | Admitting: *Deleted

## 2017-07-29 ENCOUNTER — Encounter: Payer: Self-pay | Admitting: Adult Health

## 2017-07-29 VITALS — BP 126/84 | HR 67 | Ht 64.0 in | Wt 190.0 lb

## 2017-07-29 DIAGNOSIS — I10 Essential (primary) hypertension: Secondary | ICD-10-CM

## 2017-07-29 DIAGNOSIS — Z720 Tobacco use: Secondary | ICD-10-CM

## 2017-07-29 DIAGNOSIS — R0789 Other chest pain: Secondary | ICD-10-CM | POA: Diagnosis not present

## 2017-07-29 MED ORDER — NITROGLYCERIN 0.4 MG SL SUBL: 0.4000 mg | SUBLINGUAL_TABLET | SUBLINGUAL | 3 refills | Status: DC | PRN

## 2017-07-29 NOTE — Patient Instructions (Signed)
Medication Instructions:  Your physician recommends that you continue on your current medications as directed. Please refer to the Current Medication list given to you today.   Labwork: NONE   Testing/Procedures: Your physician has requested that you have a lexiscan myoview. For further information please visit [email or web address]. Please follow instruction sheet, as given.    Follow-Up: Your physician recommends that you schedule a follow-up appointment after your StressTest    Any Other Special Instructions Will Be Listed Below (If Applicable).     If you need a refill on your cardiac medications before your next appointment, please call your pharmacy. Thank you for choosing [facility]!

## 2017-08-02 ENCOUNTER — Telehealth: Payer: Self-pay | Admitting: Family Medicine

## 2017-08-02 DIAGNOSIS — I10 Essential (primary) hypertension: Secondary | ICD-10-CM

## 2017-08-02 MED ORDER — LISINOPRIL 20 MG PO TABS: 20.0000 mg | ORAL_TABLET | Freq: Every day | ORAL | 0 refills | Status: DC

## 2017-08-02 NOTE — Telephone Encounter (Signed)
appt scheduled Pt notified 

## 2017-08-05 ENCOUNTER — Encounter (HOSPITAL_BASED_OUTPATIENT_CLINIC_OR_DEPARTMENT_OTHER)
Admission: RE | Admit: 2017-08-05 | Discharge: 2017-08-05 | Disposition: A | Discharge status: Home / Self Care | Payer: Medicare PPO | Source: Ambulatory Visit | Attending: Adult Health | Admitting: Adult Health

## 2017-08-05 ENCOUNTER — Encounter: Payer: Self-pay | Admitting: Family

## 2017-08-05 ENCOUNTER — Ambulatory Visit: Payer: Medicare PPO | Admitting: Family

## 2017-08-05 ENCOUNTER — Ambulatory Visit (INDEPENDENT_AMBULATORY_CARE_PROVIDER_SITE_OTHER): Payer: Medicare PPO | Admitting: Family

## 2017-08-05 ENCOUNTER — Encounter (HOSPITAL_COMMUNITY): Payer: Self-pay

## 2017-08-05 ENCOUNTER — Encounter (HOSPITAL_COMMUNITY)
Admission: RE | Admit: 2017-08-05 | Discharge: 2017-08-05 | Disposition: A | Discharge status: Home / Self Care | Payer: Medicare PPO | Source: Ambulatory Visit | Attending: Adult Health | Admitting: Adult Health

## 2017-08-05 VITALS — BP 170/84 | HR 72 | Temp 97.5°F | Ht 64.0 in | Wt 186.2 lb

## 2017-08-05 DIAGNOSIS — R0789 Other chest pain: Secondary | ICD-10-CM

## 2017-08-05 DIAGNOSIS — I10 Essential (primary) hypertension: Secondary | ICD-10-CM | POA: Diagnosis not present

## 2017-08-05 DIAGNOSIS — G4700 Insomnia, unspecified: Secondary | ICD-10-CM

## 2017-08-05 DIAGNOSIS — F331 Major depressive disorder, recurrent, moderate: Secondary | ICD-10-CM

## 2017-08-05 DIAGNOSIS — E669 Obesity, unspecified: Secondary | ICD-10-CM | POA: Diagnosis not present

## 2017-08-05 DIAGNOSIS — K219 Gastro-esophageal reflux disease without esophagitis: Secondary | ICD-10-CM | POA: Diagnosis not present

## 2017-08-05 DIAGNOSIS — G8929 Other chronic pain: Secondary | ICD-10-CM | POA: Diagnosis not present

## 2017-08-05 DIAGNOSIS — E785 Hyperlipidemia, unspecified: Secondary | ICD-10-CM | POA: Diagnosis not present

## 2017-08-05 DIAGNOSIS — Z23 Encounter for immunization: Secondary | ICD-10-CM

## 2017-08-05 DIAGNOSIS — F411 Generalized anxiety disorder: Secondary | ICD-10-CM | POA: Diagnosis not present

## 2017-08-05 DIAGNOSIS — F17200 Nicotine dependence, unspecified, uncomplicated: Secondary | ICD-10-CM | POA: Diagnosis not present

## 2017-08-05 DIAGNOSIS — M545 Low back pain: Secondary | ICD-10-CM | POA: Diagnosis not present

## 2017-08-05 LAB — NM MYOCAR MULTI W/SPECT W/WALL MOTION / EF
CHL CUP NUCLEAR SSS: 2
CHL CUP RESTING HR STRESS: 63 {beats}/min
LHR: 0.38
LV sys vol: 25 mL
LVDIAVOL: 82 mL (ref 46–106)
NUC STRESS TID: 1.07
Peak HR: 81 {beats}/min
SDS: 1
SRS: 1

## 2017-08-05 MED ORDER — BUPROPION HCL ER (XL) 150 MG PO TB24: 150.0000 mg | ORAL_TABLET | Freq: Every day | ORAL | 1 refills | Status: DC

## 2017-08-05 MED ORDER — SODIUM CHLORIDE 0.9% FLUSH
INTRAVENOUS | Status: AC
  Administered 2017-08-05: 10 mL via INTRAVENOUS
  Filled 2017-08-05: qty 10

## 2017-08-05 MED ORDER — TECHNETIUM TC 99M TETROFOSMIN IV KIT
30.0000 | PACK | Freq: Once | INTRAVENOUS | Status: AC | PRN
  Administered 2017-08-05: 30 via INTRAVENOUS

## 2017-08-05 MED ORDER — TECHNETIUM TC 99M TETROFOSMIN IV KIT
10.0000 | PACK | Freq: Once | INTRAVENOUS | Status: AC | PRN
  Administered 2017-08-05: 10 via INTRAVENOUS

## 2017-08-05 MED ORDER — REGADENOSON 0.4 MG/5ML IV SOLN
INTRAVENOUS | Status: AC
  Administered 2017-08-05: 0.4 mg via INTRAVENOUS
  Filled 2017-08-05: qty 5

## 2017-08-05 NOTE — Progress Notes (Addendum)
Subjective:    Patient ID: [name], female    DOB: 1960-11-21, 56 y.o.   MRN: [id number]  PT presents to the office today for chronic follow up. PT has surgery on L4-S1 on 03/22/17. Pt went to the ED on 08/23818 for chest pain. PT saw her Cardiologists today and had a stress test.  Hypertension  This is a chronic problem. The current episode started more than 1 year ago. The problem has been waxing and waning since onset. The problem is uncontrolled. Associated symptoms include anxiety, malaise/fatigue, palpitations and shortness of breath. Pertinent negatives include no peripheral edema. Risk factors for coronary artery disease include post-menopausal state, obesity and sedentary lifestyle. Hypertensive end-organ damage includes heart failure. There is no history of CAD/MI or CVA.  Gastroesophageal Reflux  She reports no belching, no coughing or no heartburn. This is a chronic problem. The current episode started more than 1 year ago. The problem occurs occasionally. The problem has been waxing and waning. The symptoms are aggravated by certain foods. Risk factors include obesity. She has tried a PPI for the symptoms. The treatment provided significant relief.  Anxiety  Presents for follow-up visit. Symptoms include depressed mood, excessive worry, insomnia, nervous/anxious behavior, palpitations and shortness of breath. Symptoms occur most days. The quality of sleep is fair.    Depression         This is a chronic problem.  The current episode started more than 1 year ago.   The onset quality is gradual.   The problem occurs intermittently.  The problem has been waxing and waning since onset.  Associated symptoms include insomnia, irritable, decreased interest and sad.  Associated symptoms include no helplessness and no hopelessness.  Compliance with treatment is good.  Past medical history includes anxiety.   Insomnia  Primary symptoms: difficulty falling asleep, frequent awakening,  malaise/fatigue.  The current episode started more than one year. The onset quality is gradual. The problem occurs intermittently. The problem has been waxing and waning since onset. The symptoms are aggravated by anxiety and pain. PMH includes: depression.  Back Pain  This is a chronic problem. The current episode started more than 1 year ago. The problem occurs intermittently. The problem has been waxing and waning since onset. The pain is present in the lumbar spine. The quality of the pain is described as aching. The pain is at a severity of 2/10. The pain is mild. Risk factors include sedentary lifestyle and obesity. She has tried analgesics for the symptoms. The treatment provided mild relief.  Hyperlipidemia  This is a chronic problem. The current episode started more than 1 year ago. The problem is uncontrolled. Recent lipid tests were reviewed and are high. Exacerbating diseases include obesity. Associated symptoms include shortness of breath. Current antihyperlipidemic treatment includes diet change. The current treatment provides no improvement of lipids. Compliance problems include adherence to diet.  Risk factors for coronary artery disease include dyslipidemia, obesity, post-menopausal and a sedentary lifestyle.      Review of Systems  Constitutional: Positive for malaise/fatigue.  Respiratory: Positive for shortness of breath. Negative for cough.   Cardiovascular: Positive for palpitations.  Gastrointestinal: Negative for heartburn.  Musculoskeletal: Positive for back pain.  Psychiatric/Behavioral: Positive for depression. The patient is nervous/anxious and has insomnia.   All other systems reviewed and are negative.      Objective:   Physical Exam  Constitutional: She is oriented to person, place, and time. She appears well-developed and well-nourished. She is irritable.  No distress.  HENT:  Head: Normocephalic and atraumatic.  Right Ear: External ear normal.  Left Ear:  External ear normal.  Nose: Nose normal.  Mouth/Throat: Oropharynx is clear and moist.  Eyes: Pupils are equal, round, and reactive to light.  Neck: Normal range of motion. Neck supple. No thyromegaly present.  Cardiovascular: Normal rate, regular rhythm, normal heart sounds and intact distal pulses.   No murmur heard. Pulmonary/Chest: Effort normal and breath sounds normal. No respiratory distress. She has no wheezes.  Abdominal: Soft. Bowel sounds are normal. She exhibits no distension. There is no tenderness.  Musculoskeletal: Normal range of motion. She exhibits no edema or tenderness.  Neurological: She is alert and oriented to person, place, and time.  Skin: Skin is warm and dry.  Psychiatric: Her speech is normal and behavior is normal. Judgment and thought content normal. Her mood appears anxious. Cognition and memory are normal.  Tearful  Vitals reviewed.     BP (!) 170/84   Pulse 72   Temp (!) 97.5 F (36.4 C) (Oral)   Ht '5\' 4"'  (1.626 m)   Wt 186 lb 3.2 oz (84.5 kg)   BMI 31.96 kg/m      Assessment & Plan:  1. Essential hypertension Keep all appts with Cardiologists  - CMP14+EGFR  2. Gastroesophageal reflux disease, esophagitis presence not specified - CMP14+EGFR  3. GAD (generalized anxiety disorder) Wellbutrin 150 mg started today, continue Celexa  Stress management  - CMP14+EGFR - buPROPion (WELLBUTRIN XL) 150 MG 24 hr tablet; Take 1 tablet (150 mg total) by mouth daily.  Dispense: 90 tablet; Refill: 1  4. Moderate episode of recurrent major depressive disorder (HCC) Wellbutrin 150 mg started today, continue Celexa  Stress management  - CMP14+EGFR - buPROPion (WELLBUTRIN XL) 150 MG 24 hr tablet; Take 1 tablet (150 mg total) by mouth daily.  Dispense: 90 tablet; Refill: 1  5. Obesity (BMI 30-39.9) - CMP14+EGFR  6. Current smoker Smoking cessation discussed - CMP14+EGFR  7. Insomnia, unspecified type - CMP14+EGFR  8. Chronic bilateral low back  pain, with sciatica presence unspecified - CMP14+EGFR  9. Hyperlipidemia, unspecified hyperlipidemia type - CMP14+EGFR - Lipid panel   Continue all meds, will add wellbutrin today, but will not add Insomnia medication today since she is taking high dose pain medication at this time.  Labs pending Health Maintenance reviewed Diet and exercise encouraged RTO 6 weeks to recheck GAD  Evelina Dun, FNP

## 2017-08-05 NOTE — Patient Instructions (Signed)
Stress and Stress Management Stress is a normal reaction to life events. It is what you feel when life demands more than you are used to or more than you can handle. Some stress can be useful. For example, the stress reaction can help you catch the last bus of the day, study for a test, or meet a deadline at work. But stress that occurs too often or for too long can cause problems. It can affect your emotional health and interfere with relationships and normal daily activities. Too much stress can weaken your immune system and increase your risk for physical illness. If you already have a medical problem, stress can make it worse. What are the causes? All sorts of life events may cause stress. An event that causes stress for one person may not be stressful for another person. Major life events commonly cause stress. These may be positive or negative. Examples include losing your job, moving into a new home, getting married, having a baby, or losing a loved one. Less obvious life events may also cause stress, especially if they occur day after day or in combination. Examples include working long hours, driving in traffic, caring for children, being in debt, or being in a difficult relationship. What are the signs or symptoms? Stress may cause emotional symptoms including, the following:  Anxiety. This is feeling worried, afraid, on edge, overwhelmed, or out of control.  Anger. This is feeling irritated or impatient.  Depression. This is feeling sad, down, helpless, or guilty.  Difficulty focusing, remembering, or making decisions.  Stress may cause physical symptoms, including the following:  Aches and pains. These may affect your head, neck, back, stomach, or other areas of your body.  Tight muscles or clenched jaw.  Low energy or trouble sleeping.  Stress may cause unhealthy behaviors, including the following:  Eating to feel better (overeating) or skipping meals.  Sleeping too little,  too much, or both.  Working too much or putting off tasks (procrastination).  Smoking, drinking alcohol, or using drugs to feel better.  How is this diagnosed? Stress is diagnosed through an assessment by your health care provider. Your health care provider will ask questions about your symptoms and any stressful life events.Your health care provider will also ask about your medical history and may order blood tests or other tests. Certain medical conditions and medicine can cause physical symptoms similar to stress. Mental illness can cause emotional symptoms and unhealthy behaviors similar to stress. Your health care provider may refer you to a mental health professional for further evaluation. How is this treated? Stress management is the recommended treatment for stress.The goals of stress management are reducing stressful life events and coping with stress in healthy ways. Techniques for reducing stressful life events include the following:  Stress identification. Self-monitor for stress and identify what causes stress for you. These skills may help you to avoid some stressful events.  Time management. Set your priorities, keep a calendar of events, and learn to say "no." These tools can help you avoid making too many commitments.  Techniques for coping with stress include the following:  Rethinking the problem. Try to think realistically about stressful events rather than ignoring them or overreacting. Try to find the positives in a stressful situation rather than focusing on the negatives.  Exercise. Physical exercise can release both physical and emotional tension. The key is to find a form of exercise you enjoy and do it regularly.  Relaxation techniques. These relax the body and  mind. Examples include yoga, meditation, tai chi, biofeedback, deep breathing, progressive muscle relaxation, listening to music, being out in nature, journaling, and other hobbies. Again, the key is to find  one or more that you enjoy and can do regularly.  Healthy lifestyle. Eat a balanced diet, get plenty of sleep, and do not smoke. Avoid using alcohol or drugs to relax.  Strong support network. Spend time with family, friends, or other people you enjoy being around.Express your feelings and talk things over with someone you trust.  Counseling or talktherapy with a mental health professional may be helpful if you are having difficulty managing stress on your own. Medicine is typically not recommended for the treatment of stress.Talk to your health care provider if you think you need medicine for symptoms of stress. Follow these instructions at home:  Keep all follow-up visits as directed by your health care provider.  Take all medicines as directed by your health care provider. Contact a health care provider if:  Your symptoms get worse or you start having new symptoms.  You feel overwhelmed by your problems and can no longer manage them on your own. Get help right away if:  You feel like hurting yourself or someone else. This information is not intended to replace advice given to you by your health care provider. Make sure you discuss any questions you have with your health care provider. Document Released: 04/21/2001 Document Revised: 04/02/2016 Document Reviewed: 06/20/2013 Elsevier Interactive Patient Education  2017 Elsevier Inc.  

## 2017-08-06 ENCOUNTER — Telehealth: Payer: Self-pay | Admitting: Family Medicine

## 2017-08-06 DIAGNOSIS — R922 Inconclusive mammogram: Secondary | ICD-10-CM

## 2017-08-06 LAB — CMP14+EGFR
A/G RATIO: 1.4 (ref 1.2–2.2)
ALK PHOS: 152 IU/L — AB (ref 39–117)
ALT: 60 IU/L — AB (ref 0–32)
AST: 19 IU/L (ref 0–40)
Albumin: 4 g/dL (ref 3.5–5.5)
BUN/Creatinine Ratio: 12 (ref 9–23)
BUN: 13 mg/dL (ref 6–24)
CALCIUM: 9.5 mg/dL (ref 8.7–10.2)
CHLORIDE: 101 mmol/L (ref 96–106)
CO2: 28 mmol/L (ref 20–29)
Creatinine, Ser: 1.09 mg/dL — ABNORMAL HIGH (ref 0.57–1.00)
GFR calc Af Amer: 65 mL/min/{1.73_m2} (ref >59)
GFR calc non Af Amer: 56 mL/min/{1.73_m2} — ABNORMAL LOW (ref >59)
Globulin, Total: 2.8 g/dL (ref 1.5–4.5)
Glucose: 92 mg/dL (ref 65–99)
POTASSIUM: 4.4 mmol/L (ref 3.5–5.2)
SODIUM: 144 mmol/L (ref 134–144)
Total Protein: 6.8 g/dL (ref 6.0–8.5)

## 2017-08-06 LAB — LIPID PANEL
Chol/HDL Ratio: 4.5 ratio — ABNORMAL HIGH (ref 0.0–4.4)
Cholesterol, Total: 220 mg/dL — ABNORMAL HIGH (ref 100–199)
HDL: 49 mg/dL (ref >39)
LDL Calculated: 129 mg/dL — ABNORMAL HIGH (ref 0–99)
TRIGLYCERIDES: 208 mg/dL — AB (ref 0–149)
VLDL Cholesterol Cal: 42 mg/dL — ABNORMAL HIGH (ref 5–40)

## 2017-08-09 NOTE — Addendum Note (Signed)
Addended by: Evelina Dun A on: 08/09/2017 12:37 PM   Modules accepted: Orders

## 2017-08-09 NOTE — Telephone Encounter (Signed)
Diagnostic mammogram ordered.  

## 2017-08-09 NOTE — Telephone Encounter (Signed)
Patient states that she has fibroid tumors and she usually gets them every year

## 2017-08-09 NOTE — Telephone Encounter (Signed)
Why does pt need diagnostic mammogram? I can not see a reason in the chart?

## 2017-08-10 ENCOUNTER — Other Ambulatory Visit: Payer: Self-pay | Admitting: Family

## 2017-08-11 ENCOUNTER — Telehealth: Payer: Self-pay | Admitting: Family Medicine

## 2017-08-11 NOTE — Telephone Encounter (Signed)
Patient prefers Dr. Ardis Hughs who is a GI doctor in [place] if you decide to do a referral.

## 2017-08-12 ENCOUNTER — Ambulatory Visit: Payer: Medicare PPO | Admitting: Adult Health

## 2017-08-12 NOTE — Telephone Encounter (Signed)
We will recheck liver enzymes in 1-2 months and refer if they are still elevated or have increased. Make follow up appt

## 2017-08-13 ENCOUNTER — Telehealth: Payer: Self-pay | Admitting: Family

## 2017-08-13 ENCOUNTER — Encounter: Payer: Self-pay | Admitting: Family

## 2017-08-13 MED ORDER — ALPRAZOLAM 0.5 MG PO TABS: ORAL_TABLET | ORAL | 3 refills | Status: DC

## 2017-08-13 NOTE — Telephone Encounter (Signed)
Called to schedule appt with pt Per pt she was just seen by [facility] Requesting refill on Xanax Please advise

## 2017-08-13 NOTE — Telephone Encounter (Signed)
[name] patient- Patient seen [name] on 9/27 and states that she told her to let her know when she needs a refill of her Xanax. Patient is requesting a refill please advise.

## 2017-08-13 NOTE — Telephone Encounter (Signed)
Please review and advise.

## 2017-08-13 NOTE — Telephone Encounter (Signed)
What is the name of the medication? ALPRAZolam (XANAX) 0.5 MG tablet  Have you contacted your pharmacy to request a refill? no  Which pharmacy would you like this sent to? [facility]   Patient notified that their request is being sent to the clinical staff for review and that they should receive a call once it is complete. If they do not receive a call within 24 hours they can check with their pharmacy or our office.

## 2017-08-13 NOTE — Telephone Encounter (Signed)
lmtcb

## 2017-08-16 ENCOUNTER — Ambulatory Visit: Payer: Medicare PPO | Admitting: Adult Health

## 2017-08-16 ENCOUNTER — Encounter: Payer: Self-pay | Admitting: Family

## 2017-08-16 ENCOUNTER — Other Ambulatory Visit: Payer: Self-pay | Admitting: Family

## 2017-08-16 DIAGNOSIS — R922 Inconclusive mammogram: Secondary | ICD-10-CM

## 2017-08-16 MED ORDER — ALPRAZOLAM 0.5 MG PO TABS: ORAL_TABLET | ORAL | 3 refills | Status: DC

## 2017-08-16 NOTE — Telephone Encounter (Signed)
Called pharmacy and patient did pick up rx on 08-13-17

## 2017-08-16 NOTE — Telephone Encounter (Signed)
Can we please verify this and if needed call rx back in? Thanks

## 2017-08-19 ENCOUNTER — Encounter: Payer: Self-pay | Admitting: Family Medicine

## 2017-08-19 ENCOUNTER — Ambulatory Visit (INDEPENDENT_AMBULATORY_CARE_PROVIDER_SITE_OTHER): Payer: Medicare PPO | Admitting: Family Medicine

## 2017-08-19 ENCOUNTER — Ambulatory Visit: Payer: Medicare PPO

## 2017-08-19 VITALS — BP 135/85 | HR 75 | Temp 98.8°F | Ht 64.0 in | Wt 183.0 lb

## 2017-08-19 DIAGNOSIS — J020 Streptococcal pharyngitis: Secondary | ICD-10-CM

## 2017-08-19 DIAGNOSIS — R300 Dysuria: Secondary | ICD-10-CM | POA: Diagnosis not present

## 2017-08-19 LAB — URINALYSIS, COMPLETE
BILIRUBIN UA: NEGATIVE
Glucose, UA: NEGATIVE
NITRITE UA: NEGATIVE
PH UA: 6 (ref 5.0–7.5)
RBC UA: NEGATIVE
SPEC GRAV UA: 1.02 (ref 1.005–1.030)
Urobilinogen, Ur: 0.2 mg/dL (ref 0.2–1.0)

## 2017-08-19 LAB — MICROSCOPIC EXAMINATION
RBC MICROSCOPIC, UA: NONE SEEN /HPF (ref 0–?)
Renal Epithel, UA: NONE SEEN /hpf

## 2017-08-19 LAB — RAPID STREP SCREEN (MED CTR MEBANE ONLY): Strep Gp A Ag, IA W/Reflex: POSITIVE — AB

## 2017-08-19 MED ORDER — AMOXICILLIN-POT CLAVULANATE 875-125 MG PO TABS: 1.0000 | ORAL_TABLET | Freq: Two times a day (BID) | ORAL | 0 refills | Status: DC

## 2017-08-19 NOTE — Progress Notes (Signed)
BP 135/85   Pulse 75   Temp 98.8 F (37.1 C) (Oral)   Ht 5\' 4"  (1.626 m)   Wt 183 lb (83 kg)   BMI 31.41 kg/m    Subjective:    Patient ID: [name], female    DOB: [birth year], 56 y.o.   MRN: [id number]  HPI: [name] is a 56 y.o. female presenting on 08/19/2017 for chest congestion, cough productive of creamy white sputum, and severe sore throat gradually worsening for the past two days. She additionally reports wheezing, headache, and nausea. She denies fever or chills at home. She reports taking tylenol and benadryl with mild relief.   She additionally reports dysuria, burning, low abdominal pressure and lower back pain for the past 1.5 weeks. She has tried hydrating herself at home without relief. She has a history of UTIs and states that this feels similar.   HPIRelevant past medical, surgical, family and social history reviewed and updated as indicated. Interim medical history since our last visit reviewed. Allergies and medications reviewed and updated.  Review of Systems  Constitutional: Positive for fatigue. Negative for activity change, chills, diaphoresis and fever.  HENT: Positive for congestion, postnasal drip, rhinorrhea and sore throat. Negative for sinus pain, sinus pressure and sneezing.   Respiratory: Positive for cough and wheezing. Negative for shortness of breath.   Cardiovascular: Negative for chest pain, palpitations and leg swelling.  Gastrointestinal: Positive for abdominal pain (lower) and nausea. Negative for abdominal distention, constipation, diarrhea and vomiting.  Genitourinary: Positive for difficulty urinating, dysuria, flank pain, frequency, pelvic pain and urgency. Negative for decreased urine volume, hematuria, menstrual problem, vaginal bleeding, vaginal discharge and vaginal pain.  Musculoskeletal: Positive for back pain. Negative for arthralgias and myalgias.  Skin: Negative for color change, pallor, rash and wound.    Neurological: Positive for headaches. Negative for dizziness, weakness and numbness.  Psychiatric/Behavioral: Negative for agitation, behavioral problems and confusion.    Per HPI unless specifically indicated above     Objective:    BP 135/85   Pulse 75   Temp 98.8 F (37.1 C) (Oral)   Ht 5\' 4"  (1.626 m)   Wt 183 lb (83 kg)   BMI 31.41 kg/m   Wt Readings from Last 3 Encounters:  08/19/17 183 lb (83 kg)  08/05/17 186 lb 3.2 oz (84.5 kg)  07/29/17 190 lb (86.2 kg)    Physical Exam  Constitutional: She is oriented to person, place, and time. She appears well-developed and well-nourished. No distress.  HENT:  Head: Normocephalic and atraumatic.  Left Ear: External ear normal.  Right ear w/ cerumen impaction. Bilateral tonsillar erythema in oralpharynx w/o exudates.  Eyes: Pupils are equal, round, and reactive to light. Conjunctivae and EOM are normal.  Neck: Normal range of motion. Neck supple. No tracheal deviation present.  Cardiovascular: Normal rate, regular rhythm and normal heart sounds.   No murmur heard. Pulmonary/Chest: Effort normal. No stridor. She has wheezes (expiratory, RLL).  Abdominal: Soft. Bowel sounds are normal. She exhibits no distension. There is tenderness (suprapubic). There is no rebound and no guarding.  Musculoskeletal: Normal range of motion. She exhibits tenderness (chronic back pain).  Neurological: She is alert and oriented to person, place, and time. She has normal reflexes.  Skin: Skin is warm and dry. No rash noted. She is not diaphoretic. No erythema. No pallor.  Psychiatric: She has a normal mood and affect. Her behavior is normal. Judgment and thought content normal.    Urinalysis:  WBC: 6-10 Epithelial cells: 0-10 Mucus threads: present Bacteria: moderate Leukocyte esterase: trace  Rapid Strep A: positive    Assessment & Plan:   Problem List Items Addressed This Visit    None    Visit Diagnoses    Dysuria    -  Primary    Relevant Medications   amoxicillin-clavulanate (AUGMENTIN) 875-125 MG tablet   Other Relevant Orders   Urinalysis, Complete (Completed)   Urine Culture   Streptococcal pharyngitis       Relevant Medications   amoxicillin-clavulanate (AUGMENTIN) 875-125 MG tablet   Other Relevant Orders   Rapid strep screen (not at [facility]) (Completed)       Strep Pharyngitis:  [name] is a 56 y.o. female presenting on 08/19/2017 for chest congestion, cough productive of creamy white sputum, and severe sore throat gradually worsening for the past two days. She also reports wheezing, headache, and nausea. On exam she has bilateral tonsillar erythema w/o exudate, mild expiratory wheeze in the RLL, and cerumen impaction in the right ear. She has no sinus tenderness and nasal passages are clear. Her Rapid Strep A came back positive. She is afebrile, and cough and wheeze are not typical, but I will treat her with Augmentin to cover for strep pharyngitis as well as for potential UTI. I instructed her to return if her symptoms to not begin to improve over the next 3 days.   UTI: She additionally reports dysuria, burning, low abdominal pressure and lower back pain for the past 1.5 weeks. On exam she has significant TTP in the suprapubic region and CVA tenderness R>L. UA is inconclusive with 6-10 WBCs, moderate bacteria present, negative nitrates, and trace leukocyte esterase. The Augmentin will cover for UTI.   Follow up plan: Return if symptoms worsen or fail to improve.  Counseling provided for all of the vaccine components Orders Placed This Encounter  Procedures  . Rapid strep screen (not at [facility])  . Urinalysis, Complete   Patient seen and examined with Brock Bad medical student. Agree with assessment and plan above. Caryl Pina, MD [facility] 08/19/2017, 11:27 AM

## 2017-08-20 ENCOUNTER — Ambulatory Visit: Payer: Medicare PPO | Admitting: Family Medicine

## 2017-08-23 ENCOUNTER — Telehealth: Payer: Self-pay | Admitting: Family Medicine

## 2017-08-23 ENCOUNTER — Other Ambulatory Visit: Payer: Medicare PPO

## 2017-08-23 ENCOUNTER — Other Ambulatory Visit: Payer: Self-pay | Admitting: Family

## 2017-08-23 NOTE — Telephone Encounter (Signed)
What symptoms do you have? Cough, yellow mucus, coughing up yellow mucus, severe diarrhea, pt thinks it is because of amoxicilin it has made her a lot worse  How long have you been sick? Since Thursday  Have you been seen for this problem? Yes Thursday by Dr. Warrick Parisian  If your provider decides to give you a prescription, which pharmacy would you like for it to be sent to? [facility]   Patient informed that this information will be sent to the clinical staff for review and that they should receive a follow up call.

## 2017-08-23 NOTE — Telephone Encounter (Signed)
Pt has appt scheduled for 08/23/2017 with Evelina Dun for evaluation

## 2017-08-24 ENCOUNTER — Other Ambulatory Visit: Payer: Self-pay | Admitting: *Deleted

## 2017-08-24 ENCOUNTER — Ambulatory Visit: Payer: Medicare PPO | Admitting: Family

## 2017-08-24 MED ORDER — HYDRALAZINE HCL 25 MG PO TABS: 25.0000 mg | ORAL_TABLET | Freq: Three times a day (TID) | ORAL | 0 refills | Status: DC | PRN

## 2017-08-24 NOTE — Telephone Encounter (Signed)
This was suppose to be sent on 08/16/17 can we check to make sure? Thanks

## 2017-08-25 ENCOUNTER — Telehealth: Payer: Self-pay | Admitting: Family Medicine

## 2017-08-25 LAB — URINE CULTURE

## 2017-08-25 MED ORDER — CIPROFLOXACIN HCL 500 MG PO TABS: 500.0000 mg | ORAL_TABLET | Freq: Two times a day (BID) | ORAL | 0 refills | Status: DC

## 2017-08-25 NOTE — Telephone Encounter (Signed)
Patient aware and verbalized understanding. °

## 2017-08-25 NOTE — Telephone Encounter (Signed)
lmctb

## 2017-08-25 NOTE — Telephone Encounter (Signed)
Has follow up apt with [facility] 11/6

## 2017-08-25 NOTE — Telephone Encounter (Signed)
Patient grew 2 types of bacteria, one of whom is resistant to Augmentin which she was taking. Please have her take the ciprofloxacin that we have sent for her Caryl Pina, MD [place] 08/25/2017, 12:58 PM

## 2017-08-26 ENCOUNTER — Ambulatory Visit (INDEPENDENT_AMBULATORY_CARE_PROVIDER_SITE_OTHER): Payer: Medicare PPO | Admitting: Family

## 2017-08-26 ENCOUNTER — Encounter: Payer: Self-pay | Admitting: Family

## 2017-08-26 VITALS — BP 157/101 | HR 78 | Temp 98.3°F | Ht 64.0 in | Wt 181.0 lb

## 2017-08-26 DIAGNOSIS — J020 Streptococcal pharyngitis: Secondary | ICD-10-CM | POA: Diagnosis not present

## 2017-08-26 DIAGNOSIS — N3001 Acute cystitis with hematuria: Secondary | ICD-10-CM

## 2017-08-26 MED ORDER — CEFTRIAXONE SODIUM 1 G IJ SOLR
1.0000 g | Freq: Once | INTRAMUSCULAR | Status: AC
  Administered 2017-08-26: 1 g via INTRAMUSCULAR

## 2017-08-26 NOTE — Patient Instructions (Signed)

## 2017-08-26 NOTE — Progress Notes (Signed)
   Subjective:    Patient ID: [name], female    DOB: [birth year], 56 y.o.   MRN: [id number]  PT presents to the office today with recurrent congestion. Pt states she tested positive for strep and UTI. PT states was started on Augmentin, but gave her diarrhea and only took it for 3 days. PT states cipro was sent to her pharmacy, but has not pick that rx up.  Diarrhea   Associated symptoms include vomiting.  Emesis   Associated symptoms include diarrhea.  Sore Throat   This is a new problem. The current episode started 1 to 4 weeks ago. The problem has been waxing and waning. The pain is at a severity of 8/10. The pain is moderate. Associated symptoms include diarrhea, ear pain, a hoarse voice, swollen glands, trouble swallowing and vomiting. She has tried acetaminophen for the symptoms. The treatment provided no relief.      Review of Systems  HENT: Positive for ear pain, hoarse voice and trouble swallowing.   Gastrointestinal: Positive for diarrhea and vomiting.  All other systems reviewed and are negative.      Objective:   Physical Exam  Constitutional: She is oriented to person, place, and time. She appears well-developed and well-nourished. No distress.  HENT:  Head: Normocephalic and atraumatic.  Right Ear: External ear normal.  Left Ear: External ear normal.  Nose: Mucosal edema and rhinorrhea present.  Mouth/Throat: Oropharyngeal exudate, posterior oropharyngeal edema and posterior oropharyngeal erythema present.  Eyes: Pupils are equal, round, and reactive to light.  Neck: Normal range of motion. Neck supple. No thyromegaly present.  Cardiovascular: Normal rate, regular rhythm, normal heart sounds and intact distal pulses.   No murmur heard. Pulmonary/Chest: Effort normal. No respiratory distress. She has decreased breath sounds. She has no wheezes.  Abdominal: Soft. Bowel sounds are normal. She exhibits no distension. There is tenderness (lower).    Musculoskeletal: Normal range of motion. She exhibits no edema or tenderness.  Neurological: She is alert and oriented to person, place, and time.  Skin: Skin is warm and dry.  Psychiatric: She has a normal mood and affect. Her behavior is normal. Judgment and thought content normal.  Vitals reviewed.   BP (!) 157/101   Pulse 78   Temp 98.3 F (36.8 C) (Oral)   Ht 5\' 4"  (1.626 m)   Wt 181 lb (82.1 kg)   BMI 31.07 kg/m      Assessment & Plan:  1. Strep throat - Take meds as prescribed - Use a cool mist humidifier  -Use saline nose sprays frequently -Saline irrigations of the nose can be very helpful if done frequently. -Force fluids -For any cough or congestion  Use plain Mucinex- regular strength or max strength is fine -For fever or aces or pains- take tylenol or ibuprofen appropriate for age and weight. -Throat lozenges if help -New toothbrush in 3 days - cefTRIAXone (ROCEPHIN) injection 1 g; Inject 1 g into the muscle once.  2. Acute cystitis with hematuria Force fluids RTO prn Pick up cipro today!!!  - cefTRIAXone (ROCEPHIN) injection 1 g; Inject 1 g into the muscle once.   Evelina Dun, FNP

## 2017-08-27 ENCOUNTER — Ambulatory Visit: Payer: Medicare PPO | Admitting: Physician Assistant

## 2017-08-27 ENCOUNTER — Telehealth: Payer: Self-pay | Admitting: Physician Assistant

## 2017-08-27 NOTE — Telephone Encounter (Signed)
Noted  

## 2017-08-28 ENCOUNTER — Emergency Department (HOSPITAL_COMMUNITY): Payer: Medicare PPO

## 2017-08-28 ENCOUNTER — Encounter (HOSPITAL_COMMUNITY): Payer: Self-pay | Admitting: Emergency Medicine

## 2017-08-28 ENCOUNTER — Emergency Department (HOSPITAL_COMMUNITY)
Admission: EM | Admit: 2017-08-28 | Discharge: 2017-08-28 | Disposition: A | Discharge status: Home / Self Care | Payer: Medicare PPO | Attending: Emergency Medicine | Admitting: Emergency Medicine

## 2017-08-28 DIAGNOSIS — Z79899 Other long term (current) drug therapy: Secondary | ICD-10-CM | POA: Diagnosis not present

## 2017-08-28 DIAGNOSIS — I10 Essential (primary) hypertension: Secondary | ICD-10-CM | POA: Diagnosis not present

## 2017-08-28 DIAGNOSIS — E876 Hypokalemia: Secondary | ICD-10-CM | POA: Diagnosis not present

## 2017-08-28 DIAGNOSIS — E860 Dehydration: Secondary | ICD-10-CM

## 2017-08-28 DIAGNOSIS — R197 Diarrhea, unspecified: Secondary | ICD-10-CM | POA: Diagnosis not present

## 2017-08-28 DIAGNOSIS — R112 Nausea with vomiting, unspecified: Secondary | ICD-10-CM

## 2017-08-28 DIAGNOSIS — F17210 Nicotine dependence, cigarettes, uncomplicated: Secondary | ICD-10-CM | POA: Insufficient documentation

## 2017-08-28 LAB — URINALYSIS, ROUTINE W REFLEX MICROSCOPIC
BILIRUBIN URINE: NEGATIVE
Glucose, UA: NEGATIVE mg/dL
Hgb urine dipstick: NEGATIVE
KETONES UR: NEGATIVE mg/dL
NITRITE: NEGATIVE
PROTEIN: 30 mg/dL — AB
Specific Gravity, Urine: 1.011 (ref 1.005–1.030)
pH: 7 (ref 5.0–8.0)

## 2017-08-28 LAB — CBC
HEMATOCRIT: 42 % (ref 36.0–46.0)
HEMOGLOBIN: 13.8 g/dL (ref 12.0–15.0)
MCH: 27.7 pg (ref 26.0–34.0)
MCHC: 32.9 g/dL (ref 30.0–36.0)
MCV: 84.3 fL (ref 78.0–100.0)
Platelets: 262 10*3/uL (ref 150–400)
RBC: 4.98 MIL/uL (ref 3.87–5.11)
RDW: 15.3 % (ref 11.5–15.5)
WBC: 9.3 10*3/uL (ref 4.0–10.5)

## 2017-08-28 LAB — COMPREHENSIVE METABOLIC PANEL
ALBUMIN: 3.4 g/dL — AB (ref 3.5–5.0)
ALT: 18 U/L (ref 14–54)
ANION GAP: 10 (ref 5–15)
AST: 17 U/L (ref 15–41)
Alkaline Phosphatase: 102 U/L (ref 38–126)
CO2: 30 mmol/L (ref 22–32)
Calcium: 8.9 mg/dL (ref 8.9–10.3)
Chloride: 100 mmol/L — ABNORMAL LOW (ref 101–111)
Creatinine, Ser: 0.68 mg/dL (ref 0.44–1.00)
GFR calc Af Amer: >60 mL/min (ref >60)
GFR calc non Af Amer: >60 mL/min (ref >60)
GLUCOSE: 137 mg/dL — AB (ref 65–99)
POTASSIUM: 2.7 mmol/L — AB (ref 3.5–5.1)
SODIUM: 140 mmol/L (ref 135–145)
Total Bilirubin: 0.7 mg/dL (ref 0.3–1.2)
Total Protein: 6.8 g/dL (ref 6.5–8.1)

## 2017-08-28 LAB — LIPASE, BLOOD: Lipase: 13 U/L (ref 11–51)

## 2017-08-28 MED ORDER — SODIUM CHLORIDE 0.9 % IV BOLUS (SEPSIS)
1000.0000 mL | Freq: Once | INTRAVENOUS | Status: AC
  Administered 2017-08-28: 1000 mL via INTRAVENOUS

## 2017-08-28 MED ORDER — SODIUM CHLORIDE 0.9 % IV SOLN
1000.0000 mL | INTRAVENOUS | Status: DC
  Administered 2017-08-28: 1000 mL via INTRAVENOUS

## 2017-08-28 MED ORDER — PROCHLORPERAZINE EDISYLATE 5 MG/ML IJ SOLN
10.0000 mg | Freq: Four times a day (QID) | INTRAMUSCULAR | Status: DC | PRN
  Administered 2017-08-28: 10 mg via INTRAVENOUS
  Filled 2017-08-28: qty 2

## 2017-08-28 MED ORDER — ONDANSETRON HCL 4 MG/2ML IJ SOLN
4.0000 mg | Freq: Once | INTRAMUSCULAR | Status: AC
  Administered 2017-08-28: 4 mg via INTRAVENOUS
  Filled 2017-08-28: qty 2

## 2017-08-28 MED ORDER — ONDANSETRON 8 MG PO TBDP: 8.0000 mg | ORAL_TABLET | Freq: Three times a day (TID) | ORAL | 0 refills | Status: DC | PRN

## 2017-08-28 MED ORDER — POTASSIUM CHLORIDE CRYS ER 20 MEQ PO TBCR: 20.0000 meq | EXTENDED_RELEASE_TABLET | Freq: Two times a day (BID) | ORAL | 0 refills | Status: DC

## 2017-08-28 MED ORDER — POTASSIUM CHLORIDE CRYS ER 20 MEQ PO TBCR
40.0000 meq | EXTENDED_RELEASE_TABLET | Freq: Once | ORAL | Status: AC
  Administered 2017-08-28: 40 meq via ORAL
  Filled 2017-08-28: qty 2

## 2017-08-28 NOTE — ED Notes (Signed)
K 2.7. Dr. Venora Maples notified.

## 2017-08-28 NOTE — ED Provider Notes (Addendum)
[facility] EMERGENCY DEPARTMENT Provider Note   CSN: [id number] Arrival date & time: 08/28/17  3086     History   Chief Complaint Chief Complaint  Patient presents with  . Emesis    HPI [name] is a 56 y.o. female.  HPI Patient is a 56 year old female presents emergency department with 7-9 days of nausea vomiting diarrhea.  She has been on multiple course of antibiotics.  Initially she was started on it Augmentin for upper respiratory symptoms.  This was followed with Macrobid for suspected urinary tract infection.  The patient continued having symptoms of not feeling well as well as nausea vomiting therefore she was given a shot of Rocephin in the clinic and switched to ciprofloxacin.  She has no dysuria or urinary frequency.  She reports ongoing right flank pain.  She reports nonbloody nonbilious vomit.  Reports loose stools without blood.  She feels weak.  Decreased oral intake over the past 48 hours and inability to keep fluids down.  Symptoms are moderate in severity. Past Medical History:  Diagnosis Date  . Abdominal pain   . Anxiety   . Arthritis   . Blood in stool   . Breast changes, fibrocystic   . Bruises easily   . Clotting disorder (HCC)     itp , none since 1976, no current hematologist  . Constipation   . DDD (degenerative disc disease), cervical    with lumbar issues  . Depression   . Generalized headaches   . GERD (gastroesophageal reflux disease)   . Hemorrhoid   . Hyperlipidemia   . Hypertension   . Idiopathic thrombocytopenia ([place])    as child  . Leg swelling    both ankles  . Liver lesion   . Nasal congestion   . Nausea & vomiting   . Neuromuscular disorder ([place])    back  injury  . Neuromuscular disorder ([place])    3 neck surgeries,neck fusion.pin,plates,screws  . Osteoporosis   . Panic attacks    pt also  has germaphobia  . Rectal bleeding   . Seizures ([place]) 1998   stress induced, one time, none since, no seizure meds  . Seizures  ([place])    Stated she had 2 weeks ago,it was like a transient,stare.  . Sleep apnea    STOPBANG=4  . Spleen enlarged 1976, none since   r/t idiopathic thrombocytopenia  . Trouble swallowing   . Unintentional weight loss   . Weakness    weakness varies    Patient Active Problem List   Diagnosis Date Noted  . Hyperlipemia 08/05/2017  . Chest pain 06/30/2017  . Dehydration 07/10/2016  . AKI (acute kidney injury) ([place]) 07/09/2016  . Hypokalemia 07/09/2016  . Obesity (BMI 30-39.9) 07/02/2016  . Osteopenia 12/20/2013  . Diverticulosis of colon (without mention of hemorrhage) 02/04/2012  . GERD (gastroesophageal reflux disease) 02/04/2012  . Dysphagia 02/04/2012  . Unspecified gastritis and gastroduodenitis without mention of hemorrhage 02/04/2012  . Cystic liver mass 01/26/2012  . Cholecystitis chronic 01/26/2012  . SYNCOPE 12/20/2007  . GAD (generalized anxiety disorder) 01/25/2007  . Depression 01/25/2007  . Essential hypertension 01/25/2007  . BREAST MASSES, BILATERAL 01/25/2007  . DEGENERATIVE DISC DISEASE, CERVICAL SPINE 01/25/2007  . LOW BACK PAIN 01/25/2007  . SEIZURE DISORDER 01/25/2007  . Insomnia 01/25/2007  . DEPENDENT EDEMA, LEGS, BILATERAL 01/25/2007  . Current smoker 01/25/2007  . TAH/BSO, HX OF 01/25/2007    Past Surgical History:  Procedure Laterality Date  . ABDOMINAL HYSTERECTOMY  1997  . BACK SURGERY    . CERVICAL SPINE SURGERY  2005, 2007, 2010   x3, fusion done,plates and screws present  . [place]  . CHOLECYSTECTOMY  03/14/2012   Procedure: LAPAROSCOPIC CHOLECYSTECTOMY;  Surgeon: Stark Klein, MD;  Location: [facility];  Service: General;  Laterality: N/A;  . colonscopy and esophagogastrodudononescopy  02-04-12  . LUMBAR DISC SURGERY  11/2014    OB History    Gravida Para Term Preterm AB Living   3 3 1 2   3    SAB TAB Ectopic Multiple Live Births                   Home Medications    Prior to Admission medications     Medication Sig Start Date End Date Taking? Authorizing Provider  ALPRAZolam Duanne Moron) 0.5 MG tablet Take 1 Tablet by mouth 2 times a day as needed 08/16/17   Evelina Dun A, FNP  atenolol (TENORMIN) 50 MG tablet TAKE 1 TABLET BY MOUTH ONCE DAILY WITH BREAKFAST 04/22/17   Evelina Dun A, FNP  buPROPion (WELLBUTRIN XL) 150 MG 24 hr tablet Take 1 tablet (150 mg total) by mouth daily. 08/05/17   Sharion Balloon, FNP  ciprofloxacin (CIPRO) 500 MG tablet Take 1 tablet (500 mg total) by mouth 2 (two) times daily. Patient not taking: Reported on 08/26/2017 08/25/17   Dettinger, Fransisca Kaufmann, MD  citalopram (CELEXA) 40 MG tablet Take 1 tablet (40 mg total) by mouth daily with breakfast. 04/15/17   Timmothy Euler, MD  diphenhydrAMINE (BENADRYL) 25 mg capsule Take 25 mg by mouth daily as needed for allergies.    [provider]  gabapentin (NEURONTIN) 300 MG capsule Take 600 mg by mouth 3 (three) times daily.  09/28/13   [provider]  hydrALAZINE (APRESOLINE) 25 MG tablet Take 1 tablet (25 mg total) by mouth 3 (three) times daily as needed (blood pressure greater than 170/100). 08/24/17 08/24/18  Evelina Dun A, FNP  lisinopril (PRINIVIL,ZESTRIL) 20 MG tablet Take 1 tablet (20 mg total) by mouth daily. 08/02/17   Sharion Balloon, FNP  magic mouthwash SOLN Take 5 mLs by mouth 4 (four) times daily. Patient not taking: Reported on 08/26/2017 06/24/17   Timmothy Euler, MD  methocarbamol (ROBAXIN) 500 MG tablet Take 1 Tablet by mouth 4 times a day as needed 08/20/17   [provider]  nitroGLYCERIN (NITROSTAT) 0.4 MG SL tablet Place 1 tablet (0.4 mg total) under the tongue every 5 (five) minutes as needed for chest pain. 07/29/17 10/27/17  Lendon Colonel, NP  ondansetron (ZOFRAN) 4 MG tablet Take 1 tablet by mouth daily as needed for nausea or vomiting.  04/02/16   [provider]  Oxycodone HCl 10 MG TABS  08/20/17   [provider]  pantoprazole (PROTONIX)  40 MG tablet TAKE ONE TABLET BY MOUTH TWICE DAILY 11/27/16   Milus Banister, MD  traZODone (DESYREL) 100 MG tablet Take 1 tablet (100 mg total) by mouth at bedtime. 04/15/17   Timmothy Euler, MD    Family History Family History  Problem Relation Age of Onset  . Hypertension Mother   . Heart disease Mother   . Lung cancer Father   . Cancer Father        lung  . Colon cancer Paternal Grandmother   . Cancer Paternal Grandmother        colon  . Heart disease Maternal Grandmother   . Diabetes  Sister     Social History Social History  Substance Use Topics  . Smoking status: Current Every Day Smoker    Packs/day: 1.00    Years: 5.00    Types: Cigarettes    Start date: 12/20/2009  . Smokeless tobacco: Never Used     Comment: VAPOR- OCCASIONALLY  . Alcohol use No     Allergies   Amoxicillin and Nsaids   Review of Systems Review of Systems  All other systems reviewed and are negative.    Physical Exam Updated Vital Signs BP (!) 171/100 (BP Location: Left Arm)   Pulse 76   Temp 98.4 F (36.9 C) (Oral)   Resp 20   Ht 5\' 4"  (1.626 m)   Wt 81.6 kg (180 lb)   SpO2 98%   BMI 30.90 kg/m   Physical Exam  Constitutional: She is oriented to person, place, and time. She appears well-developed and well-nourished. No distress.  HENT:  Head: Normocephalic and atraumatic.  Eyes: EOM are normal.  Neck: Normal range of motion.  Cardiovascular: Normal rate, regular rhythm and normal heart sounds.   Pulmonary/Chest: Effort normal and breath sounds normal.  Abdominal: Soft. She exhibits no distension. There is no tenderness.  Musculoskeletal: Normal range of motion.  Neurological: She is alert and oriented to person, place, and time.  Skin: Skin is warm and dry.  Psychiatric: She has a normal mood and affect. Judgment normal.  Nursing note and vitals reviewed.    ED Treatments / Results  Labs (all labs ordered are listed, but only abnormal results are displayed) Labs  Reviewed - No data to display  EKG  EKG Interpretation None       Radiology No results found.  Procedures Procedures (including critical care time)  Medications Ordered in ED Medications - No data to display   Initial Impression / Assessment and Plan / ED Course  I have reviewed the triage vital signs and the nursing notes.  Pertinent labs & imaging results that were available during my care of the patient were reviewed by me and considered in my medical decision making (see chart for details).     Patient is feeling better after IV fluids at this time.  Abdominal exam is without focal tenderness.  Potassium noted to be low.  We will try to replace this orally.  Overall her laboratory studies are reassuring and she is feeling better.  I suspect the patient could safely be discharged home on probiotic and nausea medication.  We will send her home with potassium as well.  I will have the patient discontinue all antibiotics as I think this is leading to gastritis and possible antibiotic associated diarrhea.  Doubt C. difficile colitis.  Close primary care follow-up.  The patient is instructed to return to the emergency department for any new or worsening symptoms  1:06 PM No vomiting in the emergency department.  Keeping fluids down.  Tolerating her potassium.  Discharged home in good condition.  We will discontinue all antibiotics.  Home with instructions as outlined above.  Final Clinical Impressions(s) / ED Diagnoses   Final diagnoses:  Nausea vomiting and diarrhea  Hypokalemia  Dehydration    New Prescriptions New Prescriptions   ONDANSETRON (ZOFRAN ODT) 8 MG DISINTEGRATING TABLET    Take 1 tablet (8 mg total) by mouth every 8 (eight) hours as needed for nausea or vomiting.   POTASSIUM CHLORIDE SA (K-DUR,KLOR-CON) 20 MEQ TABLET    Take 1 tablet (20 mEq total) by mouth  2 (two) times daily.     Jola Schmidt, MD 08/28/17 1241    Jola Schmidt, MD 08/28/17 [phone]

## 2017-08-28 NOTE — ED Triage Notes (Signed)
Pt c/o n/v/d x 10 days. Reports recent dx of UTI and Strep throat.

## 2017-08-28 NOTE — ED Notes (Signed)
Pt ambulated to bathroom with a steady gait. Pt returned to bed and given warm blanket.

## 2017-08-28 NOTE — ED Notes (Signed)
Pt ambulated to bathroom with no assistance.  

## 2017-08-30 LAB — URINE CULTURE: Culture: NO GROWTH

## 2017-09-03 ENCOUNTER — Telehealth: Payer: Self-pay | Admitting: Family

## 2017-09-04 ENCOUNTER — Other Ambulatory Visit: Payer: Self-pay | Admitting: *Deleted

## 2017-09-04 DIAGNOSIS — I10 Essential (primary) hypertension: Secondary | ICD-10-CM

## 2017-09-04 MED ORDER — LISINOPRIL 20 MG PO TABS: 20.0000 mg | ORAL_TABLET | Freq: Every day | ORAL | 1 refills | Status: DC

## 2017-09-04 NOTE — Telephone Encounter (Signed)
Lisinopril Prescription sent to pharmacy

## 2017-09-07 ENCOUNTER — Other Ambulatory Visit: Payer: Self-pay | Admitting: *Deleted

## 2017-09-09 ENCOUNTER — Other Ambulatory Visit: Payer: Self-pay | Admitting: Family Medicine

## 2017-09-10 ENCOUNTER — Telehealth: Payer: Self-pay | Admitting: *Deleted

## 2017-09-10 ENCOUNTER — Other Ambulatory Visit: Payer: Self-pay | Admitting: Family

## 2017-09-10 NOTE — Telephone Encounter (Signed)
Last seen 08/26/17  [name]  If approved route to nurse to call into [facility]

## 2017-09-10 NOTE — Telephone Encounter (Signed)
Xanax refills called to pharmacy. 

## 2017-09-17 ENCOUNTER — Ambulatory Visit: Payer: Medicare PPO | Admitting: Family

## 2017-09-21 ENCOUNTER — Encounter: Payer: Self-pay | Admitting: Family

## 2017-09-21 ENCOUNTER — Encounter: Payer: Self-pay | Admitting: Physician Assistant

## 2017-09-21 ENCOUNTER — Ambulatory Visit: Payer: Medicare PPO | Admitting: Family

## 2017-09-21 ENCOUNTER — Other Ambulatory Visit (INDEPENDENT_AMBULATORY_CARE_PROVIDER_SITE_OTHER): Payer: Medicare PPO

## 2017-09-21 ENCOUNTER — Ambulatory Visit: Payer: Medicare PPO | Admitting: Physician Assistant

## 2017-09-21 VITALS — BP 152/88 | HR 64 | Ht 64.0 in | Wt 177.0 lb

## 2017-09-21 VITALS — BP 155/86 | HR 65 | Temp 97.6°F | Ht 64.0 in | Wt 179.0 lb

## 2017-09-21 DIAGNOSIS — R1011 Right upper quadrant pain: Secondary | ICD-10-CM

## 2017-09-21 DIAGNOSIS — R110 Nausea: Secondary | ICD-10-CM

## 2017-09-21 DIAGNOSIS — Z9049 Acquired absence of other specified parts of digestive tract: Secondary | ICD-10-CM

## 2017-09-21 DIAGNOSIS — R634 Abnormal weight loss: Secondary | ICD-10-CM | POA: Diagnosis not present

## 2017-09-21 DIAGNOSIS — J320 Chronic maxillary sinusitis: Secondary | ICD-10-CM | POA: Diagnosis not present

## 2017-09-21 DIAGNOSIS — J410 Simple chronic bronchitis: Secondary | ICD-10-CM | POA: Diagnosis not present

## 2017-09-21 LAB — COMPREHENSIVE METABOLIC PANEL
ALBUMIN: 3.6 g/dL (ref 3.5–5.2)
ALK PHOS: 78 U/L (ref 39–117)
ALT: 15 U/L (ref 0–35)
AST: 14 U/L (ref 0–37)
BUN: 9 mg/dL (ref 6–23)
CALCIUM: 9.3 mg/dL (ref 8.4–10.5)
CO2: 34 mEq/L — ABNORMAL HIGH (ref 19–32)
Chloride: 102 mEq/L (ref 96–112)
Creatinine, Ser: 0.83 mg/dL (ref 0.40–1.20)
GFR: 74.95 mL/min (ref >60.00)
Glucose, Bld: 137 mg/dL — ABNORMAL HIGH (ref 70–99)
POTASSIUM: 3.8 meq/L (ref 3.5–5.1)
SODIUM: 140 meq/L (ref 135–145)
TOTAL PROTEIN: 6.5 g/dL (ref 6.0–8.3)
Total Bilirubin: 0.3 mg/dL (ref 0.2–1.2)

## 2017-09-21 LAB — URINALYSIS
Bilirubin Urine: NEGATIVE
Hgb urine dipstick: NEGATIVE
Ketones, ur: NEGATIVE
Leukocytes, UA: NEGATIVE
Nitrite: NEGATIVE
PH: 6 (ref 5.0–8.0)
TOTAL PROTEIN, URINE-UPE24: NEGATIVE
URINE GLUCOSE: NEGATIVE
Urobilinogen, UA: 0.2 (ref 0.0–1.0)

## 2017-09-21 MED ORDER — CETIRIZINE HCL 10 MG PO TABS: 10.0000 mg | ORAL_TABLET | Freq: Every day | ORAL | 11 refills | Status: DC

## 2017-09-21 MED ORDER — ONDANSETRON 8 MG PO TBDP: 8.0000 mg | ORAL_TABLET | Freq: Three times a day (TID) | ORAL | 1 refills | Status: DC | PRN

## 2017-09-21 MED ORDER — TIOTROPIUM BROMIDE MONOHYDRATE 18 MCG IN CAPS: 18.0000 ug | ORAL_CAPSULE | Freq: Every day | RESPIRATORY_TRACT | 12 refills | Status: DC

## 2017-09-21 MED ORDER — FLUTICASONE PROPIONATE 50 MCG/ACT NA SUSP: 2.0000 | Freq: Every day | NASAL | 6 refills | Status: DC

## 2017-09-21 MED ORDER — PANTOPRAZOLE SODIUM 40 MG PO TBEC: 40.0000 mg | DELAYED_RELEASE_TABLET | Freq: Two times a day (BID) | ORAL | 12 refills | Status: DC

## 2017-09-21 MED ORDER — PREDNISONE 10 MG (21) PO TBPK: ORAL_TABLET | ORAL | 0 refills | Status: DC

## 2017-09-21 NOTE — Patient Instructions (Addendum)
Please go to the basement level to have your labs drawn.  We have sent the following medications to your pharmacy for you to pick up at your convenience: [place] 1. Zofran 8 mg 2. Pantoprazole sodium 40 mg  You have been scheduled for an MRI at [facility] on Wednesday 09-29-2017. Your appointment time is 9:00 am. Please arrive 15 minutes prior to your appointment time for registration purposes. Please make certain not to have anything to eat or drink after midnight . In addition, if you have any metal in your body, have a pacemaker or defibrillator, please be sure to let your ordering physician know. This test typically takes 45 minutes to 1 hour to complete. Should you need to reschedule, please call [phone] to do so.

## 2017-09-21 NOTE — Progress Notes (Signed)
   Subjective:    Patient ID: [name], female    DOB: 1961/06/15, 56 y.o.   MRN: [id number]  Sinusitis  This is a recurrent problem. The current episode started 1 to 4 weeks ago. The problem has been waxing and waning since onset. The pain is moderate. Associated symptoms include chills, congestion, coughing, ear pain, headaches, a hoarse voice and sinus pressure. Pertinent negatives include no sore throat. Past treatments include oral decongestants and spray decongestants. The treatment provided mild relief.      Review of Systems  Constitutional: Positive for chills.  HENT: Positive for congestion, ear pain, hoarse voice and sinus pressure. Negative for sore throat.   Respiratory: Positive for cough.   Neurological: Positive for headaches.  All other systems reviewed and are negative.      Objective:   Physical Exam  Constitutional: She is oriented to person, place, and time. She appears well-developed and well-nourished. No distress.  HENT:  Head: Normocephalic and atraumatic.  Right Ear: There is tenderness.  Left Ear: External ear normal.  Nose: Mucosal edema and rhinorrhea present. Right sinus exhibits maxillary sinus tenderness and frontal sinus tenderness. Left sinus exhibits maxillary sinus tenderness and frontal sinus tenderness.  Mouth/Throat: Posterior oropharyngeal erythema present.  Eyes: Pupils are equal, round, and reactive to light.  Neck: Normal range of motion. Neck supple. No thyromegaly present.  Cardiovascular: Normal rate, regular rhythm, normal heart sounds and intact distal pulses.  No murmur heard. Pulmonary/Chest: Effort normal and breath sounds normal. No respiratory distress. She has no wheezes.  Dry intermittent cough  Abdominal: Soft. Bowel sounds are normal. She exhibits no distension. There is no tenderness.  Musculoskeletal: Normal range of motion. She exhibits no edema or tenderness.  Neurological: She is alert and oriented to person,  place, and time.  Skin: Skin is warm and dry.  Psychiatric: She has a normal mood and affect. Her behavior is normal. Judgment and thought content normal.  Vitals reviewed.     BP (!) 155/86   Pulse 65   Temp 97.6 F (36.4 C) (Oral)   Ht 5\' 4"  (1.626 m)   Wt 179 lb (81.2 kg)   BMI 30.73 kg/m      Assessment & Plan:  1. Chronic maxillary sinusitis  - cetirizine (ZYRTEC) 10 MG tablet; Take 1 tablet (10 mg total) daily by mouth.  Dispense: 30 tablet; Refill: 11 - fluticasone (FLONASE) 50 MCG/ACT nasal spray; Place 2 sprays daily into both nostrils.  Dispense: 16 g; Refill: 6 - predniSONE (STERAPRED UNI-PAK 21 TAB) 10 MG (21) TBPK tablet; Use as directed  Dispense: 21 tablet; Refill: 0  2. Simple chronic bronchitis (HCC) - cetirizine (ZYRTEC) 10 MG tablet; Take 1 tablet (10 mg total) daily by mouth.  Dispense: 30 tablet; Refill: 11 - fluticasone (FLONASE) 50 MCG/ACT nasal spray; Place 2 sprays daily into both nostrils.  Dispense: 16 g; Refill: 6 - tiotropium (SPIRIVA HANDIHALER) 18 MCG inhalation capsule; Place 1 capsule (18 mcg total) daily into inhaler and inhale.  Dispense: 30 capsule; Refill: 12 - predniSONE (STERAPRED UNI-PAK 21 TAB) 10 MG (21) TBPK tablet; Use as directed  Dispense: 21 tablet; Refill: 0  Rest Force fluids Start zyrtec and flonase Will avoid Antibiotics at this time Will start Spiriva  Smoking cessation discussed  Evelina Dun, FNP

## 2017-09-21 NOTE — Progress Notes (Signed)
I agree with the above note, plan 

## 2017-09-21 NOTE — Progress Notes (Signed)
Subjective:    Patient ID: [name], female    DOB: [birth year], 58 y.o.   MRN: [id number]  HPI [name] is a pleasant 56 year old female, known to Dr. Ardis Hughs who was last seen here in 2017. She comes in today with complaints of right-sided abdominal pain over the past several months which is been progressive. She says she underwent a multilevel lumbar fusion in May 2018, and has been in bed much of the past 6 months. She says she started noticing the right-sided abdominal pain which radiates around into her back. Her appetite has been decreased and weight is down about 25 pounds. She says this pain is very reminiscent of pain that she had in 2013 when she underwent a cholecystectomy with Dr. 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 She says she felt exactly the same way at that time and also had nausea similar to what she's been having recently. No fever or chills. Patient had EGD done in 2017 per Dr. Ardis Hughs which was a normal exam. Last colonoscopy March 2013 negative for polyps and positive for mild left-sided diverticulosis. Patient did have labs done on 08/28/2017 showing a potassium of 2.7, albumin 3.4, liver function studies were normal, CBC unremarkable UA showed small leukocytes 0-5 wbc's, and culture was negative. Patient also had a recent nuclear stress test showing a normal study with no ischemia or scar an EF of 70%. Patient is on chronic Protonix 40 mg twice a day for GERD, which works well for her. No regular aspirin or NSAIDs  Review of Systems Pertinent positive and negative review of systems were noted in the above HPI section.  All other review of systems was otherwise negative.  Outpatient Encounter Medications as of 09/21/2017  Medication Sig  . ALPRAZolam (XANAX) 0.5 MG tablet Take 1 Tablet by mouth 2 times a day as needed  . atenolol (TENORMIN) 50 MG tablet TAKE 1 TABLET BY  MOUTH ONCE DAILY WITH BREAKFAST  . buPROPion (WELLBUTRIN XL) 150 MG 24 hr tablet Take 1 tablet (150 mg total) by mouth daily.  . citalopram (CELEXA) 40 MG tablet Take 1 tablet (40 mg total) by mouth daily with breakfast.  . diphenhydrAMINE (BENADRYL) 25 mg capsule Take 25 mg by mouth daily as needed for allergies.  . hydrALAZINE (APRESOLINE) 25 MG tablet Take 1 tablet (25 mg total) by mouth 3 (three) times daily as needed (blood pressure greater than 170/100).  Marland Kitchen lisinopril (PRINIVIL,ZESTRIL) 20 MG tablet Take 1 tablet (20 mg total) by mouth daily.  . nitroGLYCERIN (NITROSTAT) 0.4 MG SL tablet Place 1 tablet (0.4 mg total) under the tongue every 5 (five) minutes as needed for chest pain.  Marland Kitchen ondansetron (ZOFRAN ODT) 8 MG disintegrating tablet Take 1 tablet (8 mg total) every 8 (eight) hours as needed by mouth for nausea or vomiting.  . Oxycodone HCl 10 MG TABS Take 5 mg by mouth daily.   . pantoprazole (PROTONIX) 40 MG tablet TAKE ONE TABLET BY MOUTH TWICE DAILY  . potassium chloride SA (K-DUR,KLOR-CON) 20 MEQ tablet Take 1 tablet (20 mEq total) by mouth 2 (two) times daily. (Patient taking differently: Take 20 mEq 2 (two) times daily as needed by mouth. )  . Probiotic Product (PROBIOTIC DAILY PO) Take by mouth.  . traZODone (DESYREL) 100 MG tablet Take 1 tablet (100 mg total) by mouth at bedtime.  . [DISCONTINUED] ondansetron (ZOFRAN ODT) 8 MG  disintegrating tablet Take 1 tablet (8 mg total) by mouth every 8 (eight) hours as needed for nausea or vomiting.  . pantoprazole (PROTONIX) 40 MG tablet Take 1 tablet (40 mg total) 2 (two) times daily by mouth.  . [DISCONTINUED] atorvastatin (LIPITOR) 20 MG tablet Take 1 Tablet by mouth once daily  . [DISCONTINUED] methocarbamol (ROBAXIN) 500 MG tablet Take 1 Tablet by mouth 4 times a day as needed   No facility-administered encounter medications on file as of 09/21/2017.    Allergies  Allergen Reactions  . Amoxicillin Diarrhea  . Nsaids Nausea Only     Abdominal pain   Patient Active Problem List   Diagnosis Date Noted  . Hyperlipemia 08/05/2017  . Chest pain 06/30/2017  . Dehydration 07/10/2016  . AKI (acute kidney injury) ([place]) 07/09/2016  . Hypokalemia 07/09/2016  . Obesity (BMI 30-39.9) 07/02/2016  . Osteopenia 12/20/2013  . Diverticulosis of colon (without mention of hemorrhage) 02/04/2012  . GERD (gastroesophageal reflux disease) 02/04/2012  . Dysphagia 02/04/2012  . Unspecified gastritis and gastroduodenitis without mention of hemorrhage 02/04/2012  . Cystic liver mass 01/26/2012  . Cholecystitis chronic 01/26/2012  . SYNCOPE 12/20/2007  . GAD (generalized anxiety disorder) 01/25/2007  . Depression 01/25/2007  . Essential hypertension 01/25/2007  . BREAST MASSES, BILATERAL 01/25/2007  . DEGENERATIVE DISC DISEASE, CERVICAL SPINE 01/25/2007  . LOW BACK PAIN 01/25/2007  . SEIZURE DISORDER 01/25/2007  . Insomnia 01/25/2007  . DEPENDENT EDEMA, LEGS, BILATERAL 01/25/2007  . Current smoker 01/25/2007  . TAH/BSO, HX OF 01/25/2007   Social History   Socioeconomic History  . Marital status: Married    Spouse name: Not on file  . Number of children: 3  . Years of education: Not on file  . Highest education level: Not on file  Social Needs  . Financial resource strain: Not on file  . Food insecurity - worry: Not on file  . Food insecurity - inability: Not on file  . Transportation needs - medical: Not on file  . Transportation needs - non-medical: Not on file  Occupational History  . Occupation: Diabled    Employer: UNEMPLOYED  Tobacco Use  . Smoking status: Current Every Day Smoker    Packs/day: 0.30    Years: 5.00    Pack years: 1.50    Types: Cigarettes    Start date: 12/20/2009  . Smokeless tobacco: Never Used  Substance and Sexual Activity  . Alcohol use: No    Alcohol/week: 0.0 oz  . Drug use: No  . Sexual activity: Yes  Other Topics Concern  . Not on file  Social History Narrative   She lives with her  boyfriend.  She used to smoke, quit May 2013, she is not drinking. She has 3 children    [name]'s family history includes Cancer in her father and paternal grandmother; Colon cancer in her paternal grandmother; Diabetes in her sister; Heart disease in her maternal grandmother and mother; Hypertension in her mother; Lung cancer in her father.      Objective:    Vitals:   09/21/17 0820  BP: (!) 152/88  Pulse: 64    Physical Exam ; well-developed white female in no acute distress, blood pressure 152/88 pulse 64, height 5 foot 4, weight 177, BMI of 30.3. HEENT; nontraumatic normocephalic EOMI PERRLA sclera anicteric, Cardiovascular; regular rate and rhythm with S1-S2 no murmur or gallop, Pulmonary; clear bilaterally, Abdomen; obese, soft, she is tender in the right upper quadrant and epigastrium there is no palpable mass  or hepatosplenomegaly, bowel sounds are present, Rectal; exam not done, Extremities; no clubbing cyanosis or edema skin warm and dry, Neuropsych; mood and affect appropriate       Assessment & Plan:   #83 56 year old white female with several month history of recurrent right upper quadrant pain, somewhat progressive and now constant with radiation around into the right back., associated with nausea and weight loss. Pain very reminiscent of symptoms she had in 2013 when she underwent cholecystectomy for chronic cholecystitis and also had wedge resection of the liver for a multi lobulated biliary hamartomatous cyst Rule out recurrent hepatic lesion  #2 diverticulosis #3 generalized anxiety disorder, depression #4 smoker #5 GERD stable  #6 status post lumbar fusion  Plan; Will refill Zofran 4 mg every 6 hours for when necessary use for nausea Refill Protonix 40 mg by mouth twice a day, aspiration to try to decrease to one by mouth every morning if possible for her chronic GERD Schedule for MRI of the abdomen attention to liver. CMETand UA Further plans pending  results of above.  [name] Genia Harold PA-C 09/21/2017   Cc: Sharion Balloon, FNP

## 2017-09-21 NOTE — Patient Instructions (Signed)

## 2017-09-29 ENCOUNTER — Ambulatory Visit (HOSPITAL_COMMUNITY)
Admission: RE | Admit: 2017-09-29 | Discharge: 2017-09-29 | Disposition: A | Discharge status: Home / Self Care | Payer: Medicare PPO | Source: Ambulatory Visit | Attending: Physician Assistant | Admitting: Physician Assistant

## 2017-09-29 DIAGNOSIS — R110 Nausea: Secondary | ICD-10-CM | POA: Diagnosis present

## 2017-09-29 DIAGNOSIS — R634 Abnormal weight loss: Secondary | ICD-10-CM | POA: Insufficient documentation

## 2017-09-29 DIAGNOSIS — Z9049 Acquired absence of other specified parts of digestive tract: Secondary | ICD-10-CM | POA: Diagnosis not present

## 2017-09-29 DIAGNOSIS — R1011 Right upper quadrant pain: Secondary | ICD-10-CM | POA: Insufficient documentation

## 2017-09-29 MED ORDER — GADOBENATE DIMEGLUMINE 529 MG/ML IV SOLN
20.0000 mL | Freq: Once | INTRAVENOUS | Status: AC | PRN
  Administered 2017-09-29: 17 mL via INTRAVENOUS

## 2017-10-05 ENCOUNTER — Encounter: Payer: Self-pay | Admitting: Family

## 2017-10-05 DIAGNOSIS — J410 Simple chronic bronchitis: Secondary | ICD-10-CM

## 2017-10-05 DIAGNOSIS — J320 Chronic maxillary sinusitis: Secondary | ICD-10-CM

## 2017-10-05 MED ORDER — FLUTICASONE PROPIONATE 50 MCG/ACT NA SUSP: 2.0000 | Freq: Every day | NASAL | 6 refills | Status: DC

## 2017-10-22 ENCOUNTER — Other Ambulatory Visit: Payer: Self-pay | Admitting: *Deleted

## 2017-10-22 DIAGNOSIS — I10 Essential (primary) hypertension: Secondary | ICD-10-CM

## 2017-10-22 MED ORDER — HYDRALAZINE HCL 25 MG PO TABS: 25.0000 mg | ORAL_TABLET | Freq: Three times a day (TID) | ORAL | 0 refills | Status: DC | PRN

## 2017-10-22 MED ORDER — LISINOPRIL 20 MG PO TABS: 20.0000 mg | ORAL_TABLET | Freq: Every day | ORAL | 0 refills | Status: DC

## 2017-10-22 NOTE — Addendum Note (Signed)
Addended by: Antonietta Barcelona D on: 10/22/2017 02:14 PM   Modules accepted: Orders

## 2017-12-09 ENCOUNTER — Other Ambulatory Visit: Payer: Self-pay | Admitting: Family

## 2017-12-09 DIAGNOSIS — I10 Essential (primary) hypertension: Secondary | ICD-10-CM

## 2017-12-10 NOTE — Telephone Encounter (Signed)
Last seen 11/18  [facility]

## 2017-12-27 ENCOUNTER — Other Ambulatory Visit: Payer: Self-pay

## 2017-12-27 DIAGNOSIS — F331 Major depressive disorder, recurrent, moderate: Secondary | ICD-10-CM

## 2017-12-27 DIAGNOSIS — F411 Generalized anxiety disorder: Secondary | ICD-10-CM

## 2017-12-27 MED ORDER — CITALOPRAM HYDROBROMIDE 40 MG PO TABS: 40.0000 mg | ORAL_TABLET | Freq: Every day | ORAL | 0 refills | Status: DC

## 2018-01-03 ENCOUNTER — Other Ambulatory Visit: Payer: Self-pay | Admitting: *Deleted

## 2018-01-03 MED ORDER — ATENOLOL 50 MG PO TABS: ORAL_TABLET | ORAL | 0 refills | Status: DC

## 2018-01-05 ENCOUNTER — Other Ambulatory Visit: Payer: Self-pay | Admitting: *Deleted

## 2018-01-05 DIAGNOSIS — G4700 Insomnia, unspecified: Secondary | ICD-10-CM

## 2018-01-06 MED ORDER — TRAZODONE HCL 100 MG PO TABS: 100.0000 mg | ORAL_TABLET | Freq: Every day | ORAL | 1 refills | Status: DC

## 2018-01-21 IMAGING — NM NM MYOCAR MULTI W/SPECT W/WALL MOTION & EF
2 series · 12 of 12 positions shown · non-contrast
Comparison: none

[Series 1: rest · 6.51mm/px · 6 of 64 frames shown]
[frame 6/64]
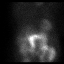
[frame 16/64]
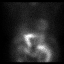
[frame 27/64]
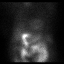
[frame 38/64]
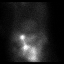
[frame 48/64]
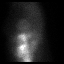
[frame 59/64]
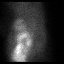

[Series 2: stress gated · 6.51mm/px · 6 of 64 frames shown]
[frame 6/64]
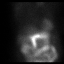
[frame 16/64]
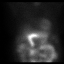
[frame 27/64]
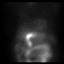
[frame 38/64]
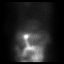
[frame 48/64]
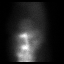
[frame 59/64]
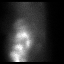

[12 of 12 positions shown; findings below may reference images not displayed]

Canned report from images found in remote index.

Refer to host system for actual result text.

## 2018-02-02 ENCOUNTER — Other Ambulatory Visit: Payer: Self-pay | Admitting: Family

## 2018-02-04 ENCOUNTER — Encounter: Payer: Self-pay | Admitting: Family

## 2018-02-04 ENCOUNTER — Other Ambulatory Visit: Payer: Self-pay | Admitting: Family

## 2018-02-04 ENCOUNTER — Ambulatory Visit (INDEPENDENT_AMBULATORY_CARE_PROVIDER_SITE_OTHER): Payer: Medicare PPO | Admitting: Family

## 2018-02-04 VITALS — BP 155/95 | HR 86 | Temp 99.0°F | Ht 64.0 in | Wt 173.8 lb

## 2018-02-04 DIAGNOSIS — F411 Generalized anxiety disorder: Secondary | ICD-10-CM | POA: Diagnosis not present

## 2018-02-04 DIAGNOSIS — E785 Hyperlipidemia, unspecified: Secondary | ICD-10-CM

## 2018-02-04 DIAGNOSIS — F17200 Nicotine dependence, unspecified, uncomplicated: Secondary | ICD-10-CM

## 2018-02-04 DIAGNOSIS — G8929 Other chronic pain: Secondary | ICD-10-CM

## 2018-02-04 DIAGNOSIS — Z23 Encounter for immunization: Secondary | ICD-10-CM

## 2018-02-04 DIAGNOSIS — I10 Essential (primary) hypertension: Secondary | ICD-10-CM

## 2018-02-04 DIAGNOSIS — M545 Low back pain: Secondary | ICD-10-CM

## 2018-02-04 DIAGNOSIS — R3911 Hesitancy of micturition: Secondary | ICD-10-CM

## 2018-02-04 DIAGNOSIS — F331 Major depressive disorder, recurrent, moderate: Secondary | ICD-10-CM

## 2018-02-04 DIAGNOSIS — E663 Overweight: Secondary | ICD-10-CM | POA: Diagnosis not present

## 2018-02-04 LAB — URINALYSIS, COMPLETE
Bilirubin, UA: NEGATIVE
Glucose, UA: NEGATIVE
KETONES UA: NEGATIVE
Leukocytes, UA: NEGATIVE
NITRITE UA: NEGATIVE
PH UA: 7 (ref 5.0–7.5)
Protein, UA: NEGATIVE
RBC, UA: NEGATIVE
SPEC GRAV UA: 1.01 (ref 1.005–1.030)
Urobilinogen, Ur: 0.2 mg/dL (ref 0.2–1.0)

## 2018-02-04 LAB — MICROSCOPIC EXAMINATION
Bacteria, UA: NONE SEEN
RBC MICROSCOPIC, UA: NONE SEEN /HPF (ref 0–2)
RENAL EPITHEL UA: NONE SEEN /HPF
WBC, UA: NONE SEEN /hpf (ref 0–5)

## 2018-02-04 NOTE — Patient Instructions (Signed)

## 2018-02-04 NOTE — Progress Notes (Signed)
 Subjective:    Patient ID: [name], female    DOB: 09/21/1961, 56 y.o.   MRN: [id number]  PT presents to the office today for chronic follow up. PT has surgery on L4-S1 on 03/22/17. Pt is followed by Cardiologists every 6 months.  Hypertension  This is a chronic problem. The current episode started more than 1 year ago. The problem has been waxing and waning since onset. The problem is uncontrolled. Associated symptoms include anxiety. Pertinent negatives include no malaise/fatigue, peripheral edema or shortness of breath. Risk factors for coronary artery disease include dyslipidemia and obesity. The current treatment provides mild improvement. There is no history of kidney disease, CAD/MI, CVA or heart failure.  Hyperlipidemia  This is a chronic problem. The current episode started more than 1 year ago. The problem is uncontrolled. Recent lipid tests were reviewed and are high. Exacerbating diseases include obesity. Pertinent negatives include no shortness of breath. Current antihyperlipidemic treatment includes statins. The current treatment provides moderate improvement of lipids. Risk factors for coronary artery disease include dyslipidemia.  Gastroesophageal Reflux  She complains of a hoarse voice. She reports no belching, no heartburn or no nausea. This is a chronic problem. The current episode started more than 1 year ago. The problem occurs occasionally. The symptoms are aggravated by certain foods. She has tried a PPI for the symptoms. The treatment provided moderate relief.  Anxiety  Presents for follow-up visit. Symptoms include excessive worry, insomnia, irritability, nervous/anxious behavior and restlessness. Patient reports no nausea or shortness of breath. Symptoms occur occasionally.    Insomnia  Primary symptoms: difficulty falling asleep, no malaise/fatigue.  The current episode started more than one year. The onset quality is gradual. The problem occurs intermittently.  PMH includes: depression.  Depression         This is a chronic problem.  The current episode started more than 1 year ago.   The onset quality is gradual.   The problem occurs intermittently.  The problem has been waxing and waning since onset.  Associated symptoms include insomnia, irritable, restlessness and sad.  Associated symptoms include no helplessness and no hopelessness.  Past treatments include SSRIs - Selective serotonin reuptake inhibitors.  Past medical history includes anxiety.   Urinary Tract Infection   This is a new problem. The current episode started in the past 7 days. The problem occurs intermittently. Associated symptoms include hesitancy and urgency. Pertinent negatives include no nausea or vomiting.      Review of Systems  Constitutional: Positive for irritability. Negative for malaise/fatigue.  HENT: Positive for hoarse voice.   Respiratory: Negative for shortness of breath.   Gastrointestinal: Negative for heartburn, nausea and vomiting.  Genitourinary: Positive for hesitancy and urgency.  Psychiatric/Behavioral: Positive for depression. The patient is nervous/anxious and has insomnia.   All other systems reviewed and are negative.      Objective:   Physical Exam  Constitutional: She is oriented to person, place, and time. She appears well-developed and well-nourished. She is irritable. No distress.  HENT:  Head: Normocephalic and atraumatic.  Right Ear: External ear normal.  Left Ear: External ear normal.  Nose: Nose normal.  Mouth/Throat: Oropharynx is clear and moist.  Eyes: Pupils are equal, round, and reactive to light.  Neck: Normal range of motion. Neck supple. No thyromegaly present.  Cardiovascular: Normal rate, regular rhythm, normal heart sounds and intact distal pulses.  No murmur heard. Pulmonary/Chest: Effort normal and breath sounds normal. No respiratory distress. She has no wheezes.    Abdominal: Soft. Bowel sounds are normal. She exhibits  no distension. There is no tenderness.  Musculoskeletal: Normal range of motion. She exhibits no edema or tenderness.  Neurological: She is alert and oriented to person, place, and time.  Skin: Skin is warm and dry.  Psychiatric: She has a normal mood and affect. Her behavior is normal. Judgment and thought content normal.  Vitals reviewed.     BP (!) 155/95   Pulse 86   Temp 99 F (37.2 C) (Oral)   Ht 5' 4" (1.626 m)   Wt 173 lb 12.8 oz (78.8 kg)   BMI 29.83 kg/m      Assessment & Plan:  1. Essential hypertension - CMP14+EGFR  2. Current smoker - CMP14+EGFR  3. Hyperlipidemia, unspecified hyperlipidemia type - CMP14+EGFR - Lipid panel  4. GAD (generalized anxiety disorder) - CMP14+EGFR  5. Moderate episode of recurrent major depressive disorder (HCC)  - CMP14+EGFR  6. Obesity (BMI 30-39.9) - CMP14+EGFR  7. Chronic bilateral low back pain, with sciatica presence unspecified - CMP14+EGFR  8. Urinary hesitancy  - CMP14+EGFR - Urinalysis, Complete   Continue all meds Labs pending Health Maintenance reviewed- TDAP given Diet and exercise encouraged RTO 6 months   Evelina Dun, FNP

## 2018-02-05 ENCOUNTER — Other Ambulatory Visit: Payer: Self-pay | Admitting: Family Medicine

## 2018-02-05 ENCOUNTER — Telehealth: Payer: Self-pay | Admitting: Family

## 2018-02-05 LAB — CMP14+EGFR
ALK PHOS: 102 IU/L (ref 39–117)
ALT: 27 IU/L (ref 0–32)
AST: 25 IU/L (ref 0–40)
Albumin/Globulin Ratio: 1.5 (ref 1.2–2.2)
Albumin: 4.1 g/dL (ref 3.5–5.5)
BUN/Creatinine Ratio: 13 (ref 9–23)
BUN: 9 mg/dL (ref 6–24)
Bilirubin Total: 0.3 mg/dL (ref 0.0–1.2)
CHLORIDE: 100 mmol/L (ref 96–106)
CO2: 25 mmol/L (ref 20–29)
CREATININE: 0.69 mg/dL (ref 0.57–1.00)
Calcium: 9.4 mg/dL (ref 8.7–10.2)
GFR calc Af Amer: 111 mL/min/{1.73_m2} (ref >59)
GFR calc non Af Amer: 96 mL/min/{1.73_m2} (ref >59)
GLUCOSE: 81 mg/dL (ref 65–99)
Globulin, Total: 2.8 g/dL (ref 1.5–4.5)
Potassium: 4.1 mmol/L (ref 3.5–5.2)
SODIUM: 140 mmol/L (ref 134–144)
Total Protein: 6.9 g/dL (ref 6.0–8.5)

## 2018-02-05 LAB — LIPID PANEL
Chol/HDL Ratio: 5.3 ratio — ABNORMAL HIGH (ref 0.0–4.4)
Cholesterol, Total: 201 mg/dL — ABNORMAL HIGH (ref 100–199)
HDL: 38 mg/dL — ABNORMAL LOW
LDL Calculated: 140 mg/dL — ABNORMAL HIGH (ref 0–99)
Triglycerides: 115 mg/dL (ref 0–149)
VLDL Cholesterol Cal: 23 mg/dL (ref 5–40)

## 2018-02-05 MED ORDER — ALPRAZOLAM 0.5 MG PO TABS
ORAL_TABLET | ORAL | 0 refills | Status: DC
Start: 2018-02-05 — End: 2018-02-05

## 2018-02-05 MED ORDER — ALPRAZOLAM 0.5 MG PO TABS: ORAL_TABLET | ORAL | 0 refills | Status: DC

## 2018-02-05 NOTE — Telephone Encounter (Signed)
Pharmacy talked to [facility]

## 2018-02-05 NOTE — Telephone Encounter (Signed)
Pt aware refill sent into pharmacy 

## 2018-02-05 NOTE — Telephone Encounter (Signed)
Patient seen this week by PCP, at pharmacy requesting refill of Xanax, will give 1 refill.  Laroy Apple, MD [facility] 02/05/2018, 11:21 AM

## 2018-02-07 ENCOUNTER — Other Ambulatory Visit: Payer: Self-pay | Admitting: Family

## 2018-02-07 ENCOUNTER — Telehealth: Payer: Self-pay | Admitting: Family

## 2018-02-07 MED ORDER — ATORVASTATIN CALCIUM 20 MG PO TABS: 20.0000 mg | ORAL_TABLET | Freq: Every day | ORAL | 1 refills | Status: DC

## 2018-02-07 NOTE — Telephone Encounter (Signed)
Pt called back - she states that she gets oxycodone from her back Dr. She is rx's 4 a day but only takes 1.5-2 a day   She is also on the xanax and takes 2 a day.  She splits these up and does not take the at the same time.   Please advise - she wants to be able to keep both.

## 2018-02-07 NOTE — Telephone Encounter (Signed)
Lm with pt - 4/1-jhb

## 2018-02-07 NOTE — Telephone Encounter (Signed)
Pt rc for [employer]

## 2018-02-07 NOTE — Telephone Encounter (Signed)
Pt is currently taking oxycodone and xanax. Does she take both of these regularly?  We can prescribe both. If she has been taking the xanax regularly we can give a month to wean herself off, but can not have both prescribed.

## 2018-02-08 MED ORDER — ALPRAZOLAM 0.5 MG PO TABS: ORAL_TABLET | ORAL | 0 refills | Status: DC

## 2018-02-08 NOTE — Telephone Encounter (Signed)
I will refill xanax, but she needs to start weaning herself off. The laws are changing on controlled medications and we are not allowed to prescribe both medications to a patient.

## 2018-02-08 NOTE — Telephone Encounter (Signed)
Pt aware.

## 2018-02-08 NOTE — Addendum Note (Signed)
Addended by: Evelina Dun A on: 02/08/2018 10:42 AM   Modules accepted: Orders

## 2018-03-07 ENCOUNTER — Other Ambulatory Visit: Payer: Self-pay | Admitting: Family Medicine

## 2018-03-10 ENCOUNTER — Encounter: Payer: Self-pay | Admitting: Family

## 2018-03-10 ENCOUNTER — Other Ambulatory Visit: Payer: Self-pay | Admitting: Family

## 2018-03-10 MED ORDER — HYDROCORTISONE ACETATE 25 MG RE SUPP: 25.0000 mg | Freq: Two times a day (BID) | RECTAL | 0 refills | Status: DC

## 2018-03-17 IMAGING — MR MR ABDOMEN WO/W CM
10 of 19 series · 20 of 48 positions shown · IV contrast (multihance)
Comparison: Abdominal MRI 12/25/2011. abdominopelvic CT 11/15/2015.

CLINICAL DATA: Recurrent right-sided abdominal pain and nausea.
History of chronic cholecystitis with cholecystectomy and wedge
resection of biliary hamartoma 0858.

EXAM:
MRI ABDOMEN WITHOUT AND WITH CONTRAST
TECHNIQUE: Multiplanar multisequence MR imaging of the abdomen was performed
both before and after the administration of intravenous contrast.
CONTRAST:  17mL MULTIHANCE GADOBENATE DIMEGLUMINE 529 MG/ML IV SOLN

[Series 3: T2 fat-sat · axial · 5.0mm · 0.78mm/px · z∈[-80,+165]mm · 2 of 50 slices shown]
[im 1/50]
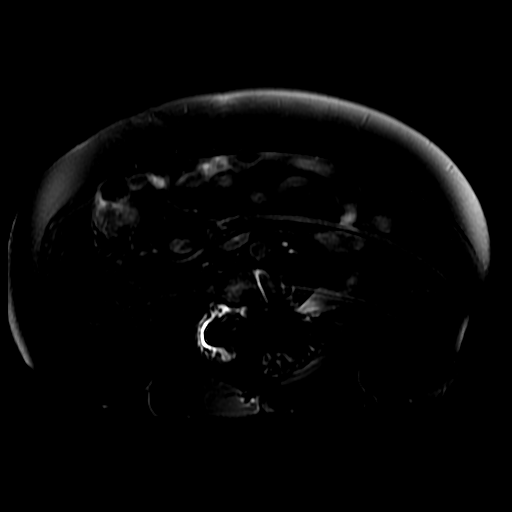
[im 50/50]
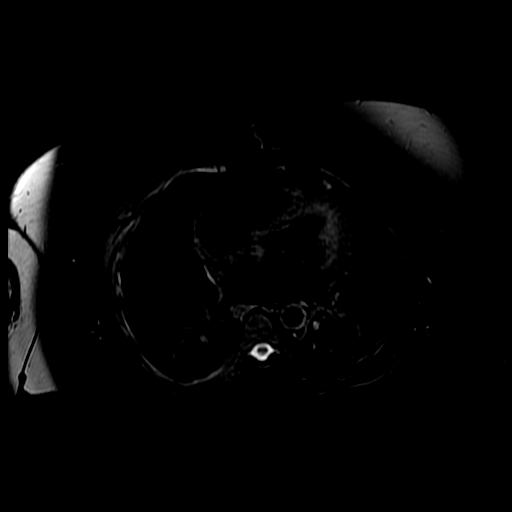

[Series 4: T2 · axial · 5.0mm · 0.78mm/px · z∈[-90,+155]mm · 2 of 50 slices shown (1 of 3)]
[im 1/50]
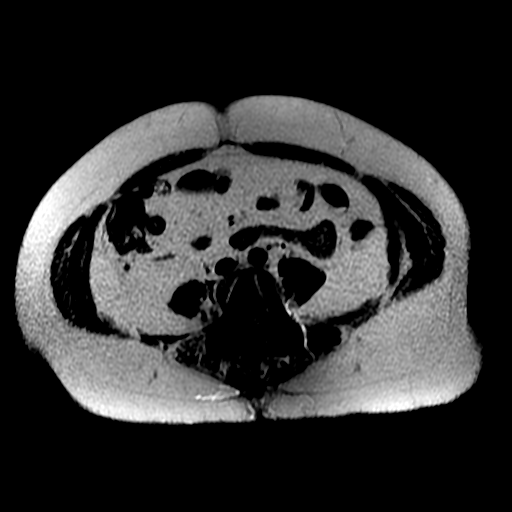
[im 50/50]
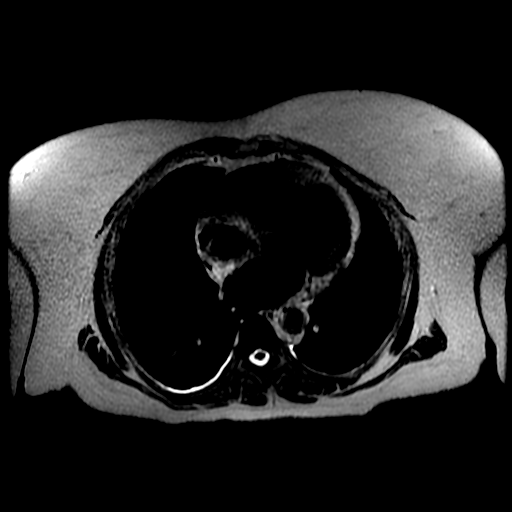

[Series 5: DWI b500 · axial · 6.0mm · 1.56mm/px · z∈[-74,+168]mm · 2 of 64 slices shown]
[im 1/64]
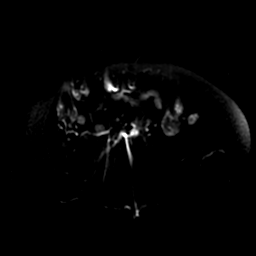
[im 64/64]
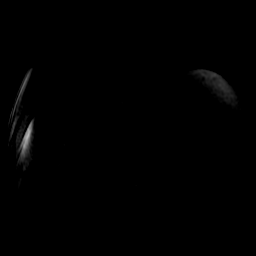

[Series 6: T2 · axial · 5.0mm · 0.78mm/px · z∈[-90,+155]mm · 2 of 50 slices shown (2 of 3)]
[im 1/50]
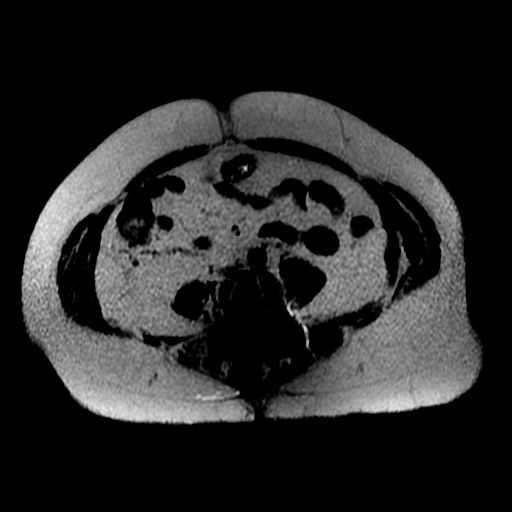
[im 50/50]
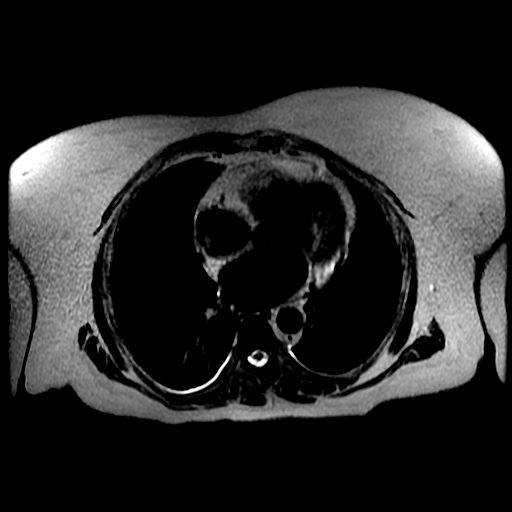

[Series 7: ax dualecho · axial · 5.0mm · 0.78mm/px · z∈[-90,+155]mm · 3 of 100 slices shown]
[im 1/100]
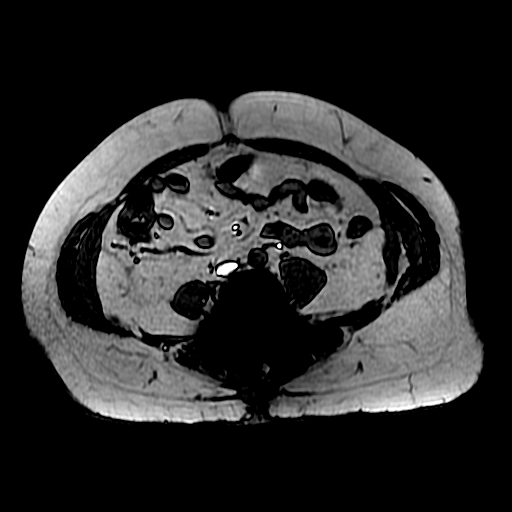
[im 50/100]
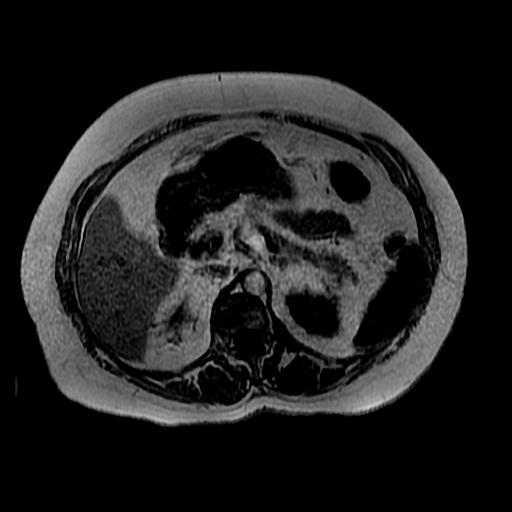
[im 100/100]
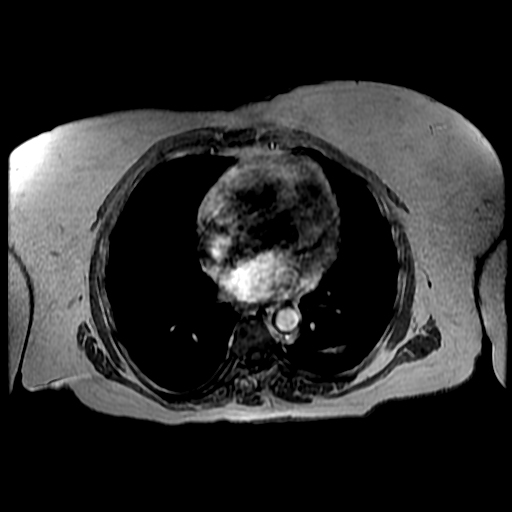

[Series 8: T2 · coronal · 5.0mm · 0.82mm/px · 1 of 46 slices shown (3 of 3)]
[im 1/46]
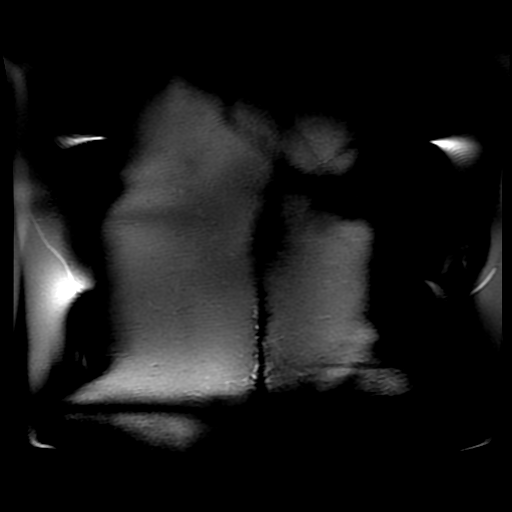

[Series 9: bSSFP · axial · 5.0mm · 0.78mm/px · z∈[-74,+171]mm · 2 of 50 slices shown]
[im 1/50]
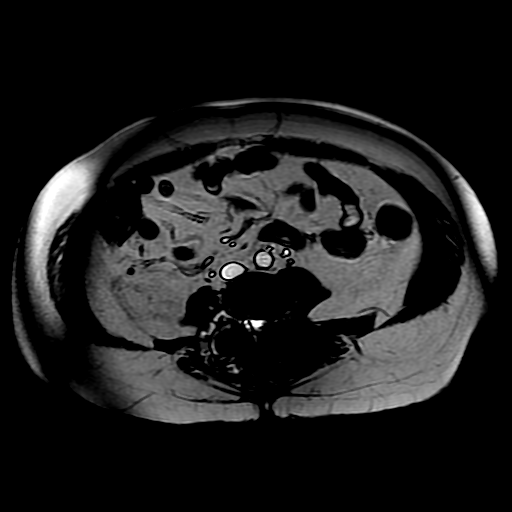
[im 50/50]
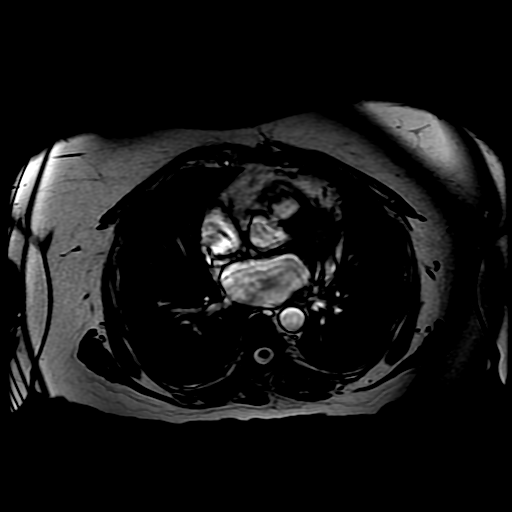

[Series 500: DWI · axial · 6.0mm · 1.56mm/px · 1 of 32 slices shown]
[im 1/32]
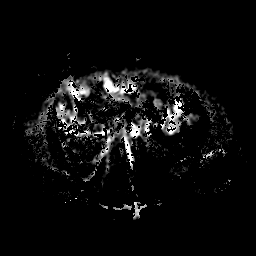

[Series 1000: T1 dynamic · axial · 5.0mm · 0.82mm/px · z∈[-90,+168]mm · 3 of 104 slices shown (1 of 2)]
[im 1/104]
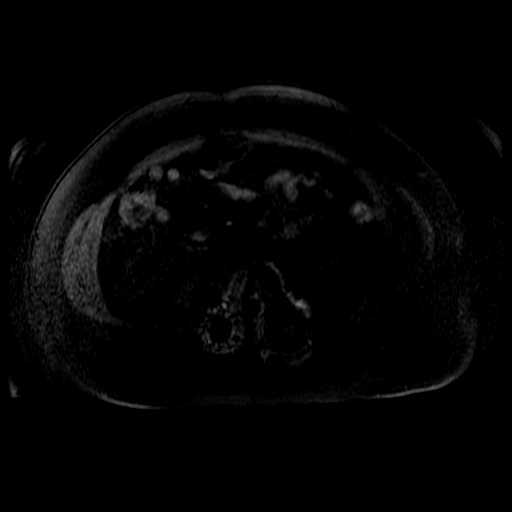
[im 52/104]
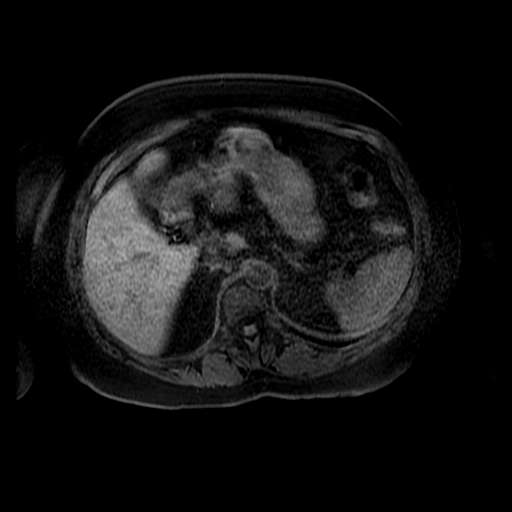
[im 104/104]
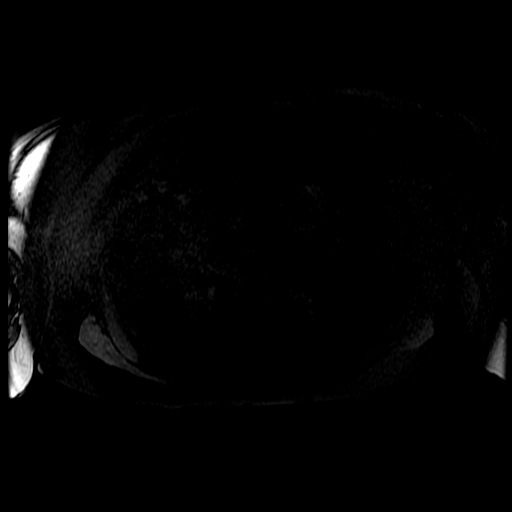

[Series 1001: T1 dynamic · axial · 5.0mm · 0.82mm/px · z∈[-90,+38]mm · 2 of 104 slices shown (2 of 2)]
[im 1/104]
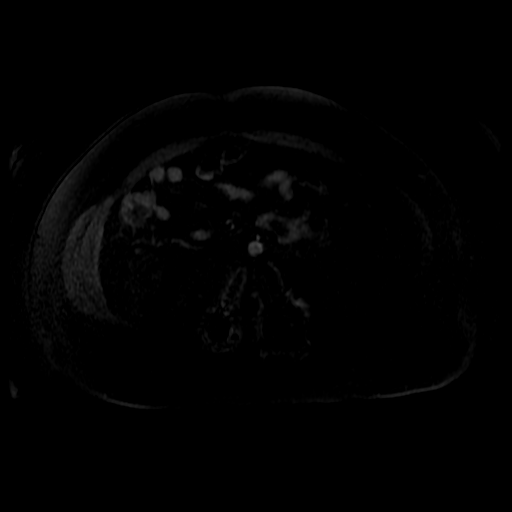
[im 52/104]
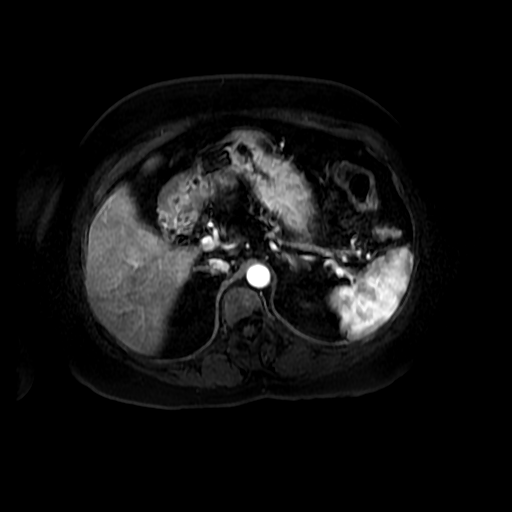

[20 of 48 positions shown; findings below may reference images not displayed]

FINDINGS: Lower chest:  The visualized lower chest appears unremarkable.

Hepatobiliary: Since the previous MRI, the complex cystic lesion in
the right lobe has been resected. There is no recurrent cystic
lesion or other mass lesion in this area. There is an ill-defined
lesion posteriorly in the lateral segment of the left hepatic lobe
which demonstrates mildly increased T2 signal and loss of signal on
the gradient echo opposed phase images, consistent with focal fat.
This measures up to 5.7 x 4.2 cm transverse on image 19 of series 6.
No suspicious hepatic findings. Status post cholecystectomy with
mild biliary dilatation, within physiologic limits.

Pancreas: Unremarkable. No pancreatic ductal dilatation or
surrounding inflammatory changes.

Spleen: Normal in size without focal abnormality.

Adrenals/Urinary Tract: Both adrenal glands appear normal. There is
a tiny cyst anteriorly in the mid right kidney. Both kidneys
otherwise appear normal. No evidence of hydronephrosis.

Stomach/Bowel: No evidence of bowel wall thickening, distention or
surrounding inflammatory change.

Vascular/Lymphatic: There are no enlarged abdominal lymph nodes. No
significant vascular findings are present.

Other: No ascites.

Musculoskeletal: No acute or significant osseous findings. There are
postsurgical changes in the lumbar spine status post fusion. There
is a mild convex right scoliosis.
IMPRESSION: 1. No acute findings or explanation for the patient's symptoms.
There is no evidence of recurrent biliary hamartoma.
2. Focal fat posteriorly in the lateral segment of the left hepatic
lobe.
3. Mild biliary prominence post cholecystectomy, within physiologic
limits.

## 2018-03-26 ENCOUNTER — Other Ambulatory Visit: Payer: Self-pay | Admitting: Family

## 2018-03-26 DIAGNOSIS — F331 Major depressive disorder, recurrent, moderate: Secondary | ICD-10-CM

## 2018-03-26 DIAGNOSIS — F411 Generalized anxiety disorder: Secondary | ICD-10-CM

## 2018-03-30 ENCOUNTER — Other Ambulatory Visit: Payer: Self-pay | Admitting: Family

## 2018-03-30 DIAGNOSIS — I10 Essential (primary) hypertension: Secondary | ICD-10-CM

## 2018-04-04 ENCOUNTER — Other Ambulatory Visit: Payer: Self-pay | Admitting: Family

## 2018-04-05 ENCOUNTER — Other Ambulatory Visit: Payer: Self-pay | Admitting: Family

## 2018-04-05 DIAGNOSIS — F331 Major depressive disorder, recurrent, moderate: Secondary | ICD-10-CM

## 2018-04-05 DIAGNOSIS — F411 Generalized anxiety disorder: Secondary | ICD-10-CM

## 2018-04-05 DIAGNOSIS — I10 Essential (primary) hypertension: Secondary | ICD-10-CM

## 2018-04-05 MED ORDER — ATENOLOL 50 MG PO TABS: ORAL_TABLET | ORAL | 0 refills | Status: DC

## 2018-04-05 MED ORDER — ALPRAZOLAM 0.5 MG PO TABS: 0.5000 mg | ORAL_TABLET | Freq: Two times a day (BID) | ORAL | 2 refills | Status: DC | PRN

## 2018-04-05 MED ORDER — CITALOPRAM HYDROBROMIDE 40 MG PO TABS: 40.0000 mg | ORAL_TABLET | Freq: Every day | ORAL | 0 refills | Status: DC

## 2018-04-05 MED ORDER — LISINOPRIL 20 MG PO TABS: 20.0000 mg | ORAL_TABLET | Freq: Every day | ORAL | 0 refills | Status: DC

## 2018-04-25 ENCOUNTER — Encounter: Payer: Self-pay | Admitting: Family

## 2018-04-27 ENCOUNTER — Other Ambulatory Visit: Payer: Self-pay | Admitting: Family

## 2018-04-27 DIAGNOSIS — F331 Major depressive disorder, recurrent, moderate: Secondary | ICD-10-CM

## 2018-04-27 DIAGNOSIS — F411 Generalized anxiety disorder: Secondary | ICD-10-CM

## 2018-07-01 ENCOUNTER — Other Ambulatory Visit: Payer: Self-pay | Admitting: Family

## 2018-07-01 DIAGNOSIS — G4700 Insomnia, unspecified: Secondary | ICD-10-CM

## 2018-07-04 ENCOUNTER — Other Ambulatory Visit: Payer: Self-pay | Admitting: Family

## 2018-07-04 NOTE — Telephone Encounter (Signed)
Last seen 02/04/18  [facility]

## 2018-07-04 NOTE — Telephone Encounter (Signed)
Last seen 02/04/18   [facility]

## 2018-07-13 ENCOUNTER — Other Ambulatory Visit: Payer: Self-pay | Admitting: Family

## 2018-07-13 DIAGNOSIS — F331 Major depressive disorder, recurrent, moderate: Secondary | ICD-10-CM

## 2018-07-13 DIAGNOSIS — F411 Generalized anxiety disorder: Secondary | ICD-10-CM

## 2018-08-02 ENCOUNTER — Ambulatory Visit: Payer: Medicare PPO | Admitting: Family

## 2018-08-03 ENCOUNTER — Other Ambulatory Visit: Payer: Self-pay | Admitting: Family

## 2018-08-04 ENCOUNTER — Encounter: Payer: Self-pay | Admitting: Family

## 2018-08-04 ENCOUNTER — Other Ambulatory Visit: Payer: Self-pay | Admitting: Family

## 2018-08-04 NOTE — Telephone Encounter (Signed)
Last seen 02/04/18

## 2018-08-05 ENCOUNTER — Ambulatory Visit: Payer: Medicare PPO | Admitting: Family

## 2018-08-05 ENCOUNTER — Other Ambulatory Visit: Payer: Self-pay | Admitting: Family

## 2018-08-05 MED ORDER — ALPRAZOLAM 0.5 MG PO TABS: ORAL_TABLET | ORAL | 0 refills | Status: DC

## 2018-08-05 NOTE — Telephone Encounter (Signed)
Last seen 02/04/18

## 2018-08-08 ENCOUNTER — Encounter: Payer: Self-pay | Admitting: Family

## 2018-08-08 ENCOUNTER — Ambulatory Visit (INDEPENDENT_AMBULATORY_CARE_PROVIDER_SITE_OTHER): Payer: Medicare PPO | Admitting: Family

## 2018-08-08 VITALS — BP 134/78 | HR 78 | Wt 185.0 lb

## 2018-08-08 DIAGNOSIS — Z23 Encounter for immunization: Secondary | ICD-10-CM

## 2018-08-08 DIAGNOSIS — Z79899 Other long term (current) drug therapy: Secondary | ICD-10-CM | POA: Insufficient documentation

## 2018-08-08 DIAGNOSIS — Z9114 Patient's other noncompliance with medication regimen: Secondary | ICD-10-CM | POA: Insufficient documentation

## 2018-08-08 DIAGNOSIS — I10 Essential (primary) hypertension: Secondary | ICD-10-CM

## 2018-08-08 DIAGNOSIS — F1320 Sedative, hypnotic or anxiolytic dependence, uncomplicated: Secondary | ICD-10-CM

## 2018-08-08 DIAGNOSIS — Z91148 Patient's other noncompliance with medication regimen for other reason: Secondary | ICD-10-CM | POA: Insufficient documentation

## 2018-08-08 DIAGNOSIS — F411 Generalized anxiety disorder: Secondary | ICD-10-CM | POA: Diagnosis not present

## 2018-08-08 DIAGNOSIS — E669 Obesity, unspecified: Secondary | ICD-10-CM | POA: Diagnosis not present

## 2018-08-08 DIAGNOSIS — Z716 Tobacco abuse counseling: Secondary | ICD-10-CM

## 2018-08-08 DIAGNOSIS — F331 Major depressive disorder, recurrent, moderate: Secondary | ICD-10-CM | POA: Diagnosis not present

## 2018-08-08 HISTORY — DX: Patient's other noncompliance with medication regimen for other reason: Z91.148

## 2018-08-08 HISTORY — DX: Sedative, hypnotic or anxiolytic dependence, uncomplicated: F13.20

## 2018-08-08 MED ORDER — BUPROPION HCL ER (SR) 150 MG PO TB12: 150.0000 mg | ORAL_TABLET | Freq: Two times a day (BID) | ORAL | 2 refills | Status: DC

## 2018-08-08 MED ORDER — ALPRAZOLAM 0.5 MG PO TABS: ORAL_TABLET | ORAL | 5 refills | Status: DC

## 2018-08-08 NOTE — Progress Notes (Signed)
Subjective:    Patient ID: [name], female    DOB: Aug 08, 1961, 57 y.o.   MRN: [id number]  Chief Complaint  Patient presents with  . wants to try wellbutrin to help with smoking cessation    Hypertension  This is a chronic problem. The current episode started more than 1 year ago. The problem has been resolved since onset. The problem is controlled. Associated symptoms include anxiety and malaise/fatigue. Pertinent negatives include no peripheral edema or shortness of breath. Risk factors for coronary artery disease include dyslipidemia, obesity, smoking/tobacco exposure and sedentary lifestyle. The current treatment provides moderate improvement.  Anxiety  Presents for follow-up visit. Symptoms include depressed mood, excessive worry, insomnia, irritability, nervous/anxious behavior and restlessness. Patient reports no shortness of breath. Symptoms occur occasionally. The severity of symptoms is moderate. The quality of sleep is good.    Depression         This is a chronic problem.  The current episode started more than 1 year ago.   The onset quality is gradual.   The problem occurs intermittently.  The problem has been waxing and waning since onset.  Associated symptoms include insomnia, restlessness and sad.  Associated symptoms include no helplessness, no hopelessness and not irritable.  Past treatments include SNRIs - Serotonin and norepinephrine reuptake inhibitors.  Past medical history includes anxiety.   Smoker PT currently smoking 5 cigarettes a day. She states she has cut back from a pack a day.     Review of Systems  Constitutional: Positive for irritability and malaise/fatigue.  Respiratory: Negative for shortness of breath.   Psychiatric/Behavioral: Positive for depression. The patient is nervous/anxious and has insomnia.   All other systems reviewed and are negative.      Objective:   Physical Exam  Constitutional: She is oriented to person, place, and time.  She appears well-developed and well-nourished. She is not irritable. No distress.  HENT:  Head: Normocephalic and atraumatic.  Right Ear: External ear normal.  Left Ear: External ear normal.  Mouth/Throat: Oropharynx is clear and moist.  Hoarse voice   Eyes: Pupils are equal, round, and reactive to light.  Neck: Normal range of motion. Neck supple. No thyromegaly present.  Cardiovascular: Normal rate, regular rhythm, normal heart sounds and intact distal pulses.  No murmur heard. Pulmonary/Chest: Effort normal and breath sounds normal. No respiratory distress. She has no wheezes.  Abdominal: Soft. Bowel sounds are normal. She exhibits no distension. There is no tenderness.  Musculoskeletal: Normal range of motion. She exhibits no edema or tenderness.  Neurological: She is alert and oriented to person, place, and time. She has normal reflexes. No cranial nerve deficit.  Skin: Skin is warm and dry.  Psychiatric: She has a normal mood and affect. Her behavior is normal. Judgment and thought content normal.  Vitals reviewed.     BP 134/78   Pulse 78   Wt 185 lb (83.9 kg)   BMI 31.76 kg/m      Assessment & Plan:  [name] comes in today with chief complaint of wants to try wellbutrin to help with smoking cessation   Diagnosis and orders addressed:  1. Moderate episode of recurrent major depressive disorder (HCC) - buPROPion (WELLBUTRIN SR) 150 MG 12 hr tablet; Take 1 tablet (150 mg total) by mouth 2 (two) times daily.  Dispense: 180 tablet; Refill: 2  2. Obesity (BMI 30-39.9)  3. GAD (generalized anxiety disorder) - buPROPion (WELLBUTRIN SR) 150 MG 12 hr tablet; Take 1  tablet (150 mg total) by mouth 2 (two) times daily.  Dispense: 180 tablet; Refill: 2 - ALPRAZolam (XANAX) 0.5 MG tablet; TAKE 1 TABLET BY MOUTH 2 TIMES A DAY AS NEEDED  Dispense: 60 tablet; Refill: 5 - ToxASSURE Select 13 (MW), Urine  4. Essential hypertension  5. Benzodiazepine dependence (HCC) -  ALPRAZolam (XANAX) 0.5 MG tablet; TAKE 1 TABLET BY MOUTH 2 TIMES A DAY AS NEEDED  Dispense: 60 tablet; Refill: 5 - ToxASSURE Select 13 (MW), Urine  6. Controlled substance agreement signed - ALPRAZolam (XANAX) 0.5 MG tablet; TAKE 1 TABLET BY MOUTH 2 TIMES A DAY AS NEEDED  Dispense: 60 tablet; Refill: 5 - ToxASSURE Select 13 (MW), Urine  7. Encounter for smoking cessation counseling - buPROPion (WELLBUTRIN SR) 150 MG 12 hr tablet; Take 1 tablet (150 mg total) by mouth 2 (two) times daily.  Dispense: 180 tablet; Refill: 2   Labs pending Health Maintenance reviewed Diet and exercise encouraged  Follow up plan: 6 months   Betty Dun, FNP

## 2018-08-08 NOTE — Patient Instructions (Signed)
Steps to Quit Smoking Smoking tobacco can be harmful to your health and can affect almost every organ in your body. Smoking puts you, and those around you, at risk for developing many serious chronic diseases. Quitting smoking is difficult, but it is one of the best things that you can do for your health. It is never too late to quit. What are the benefits of quitting smoking? When you quit smoking, you lower your risk of developing serious diseases and conditions, such as:  Lung cancer or lung disease, such as COPD.  Heart disease.  Stroke.  Heart attack.  Infertility.  Osteoporosis and bone fractures.  Additionally, symptoms such as coughing, wheezing, and shortness of breath may get better when you quit. You may also find that you get sick less often because your body is stronger at fighting off colds and infections. If you are pregnant, quitting smoking can help to reduce your chances of having a baby of low birth weight. How do I get ready to quit? When you decide to quit smoking, create a plan to make sure that you are successful. Before you quit:  Pick a date to quit. Set a date within the next two weeks to give you time to prepare.  Write down the reasons why you are quitting. Keep this list in places where you will see it often, such as on your bathroom mirror or in your car or wallet.  Identify the people, places, things, and activities that make you want to smoke (triggers) and avoid them. Make sure to take these actions: ? Throw away all cigarettes at home, at work, and in your car. ? Throw away smoking accessories, such as ashtrays and lighters. ? Clean your car and make sure to empty the ashtray. ? Clean your home, including curtains and carpets.  Tell your family, friends, and coworkers that you are quitting. Support from your loved ones can make quitting easier.  Talk with your health care provider about your options for quitting smoking.  Find out what treatment  options are covered by your health insurance.  What strategies can I use to quit smoking? Talk with your healthcare provider about different strategies to quit smoking. Some strategies include:  Quitting smoking altogether instead of gradually lessening how much you smoke over a period of time. Research shows that quitting "cold turkey" is more successful than gradually quitting.  Attending in-person counseling to help you build problem-solving skills. You are more likely to have success in quitting if you attend several counseling sessions. Even short sessions of 10 minutes can be effective.  Finding resources and support systems that can help you to quit smoking and remain smoke-free after you quit. These resources are most helpful when you use them often. They can include: ? Online chats with a counselor. ? Telephone quitlines. ? Printed self-help materials. ? Support groups or group counseling. ? Text messaging programs. ? Mobile phone applications.  Taking medicines to help you quit smoking. (If you are pregnant or breastfeeding, talk with your health care provider first.) Some medicines contain nicotine and some do not. Both types of medicines help with cravings, but the medicines that include nicotine help to relieve withdrawal symptoms. Your health care provider may recommend: ? Nicotine patches, gum, or lozenges. ? Nicotine inhalers or sprays. ? Non-nicotine medicine that is taken by mouth.  Talk with your health care provider about combining strategies, such as taking medicines while you are also receiving in-person counseling. Using these two strategies together   makes you more likely to succeed in quitting than if you used either strategy on its own. If you are pregnant or breastfeeding, talk with your health care provider about finding counseling or other support strategies to quit smoking. Do not take medicine to help you quit smoking unless told to do so by your health care  provider. What things can I do to make it easier to quit? Quitting smoking might feel overwhelming at first, but there is a lot that you can do to make it easier. Take these important actions:  Reach out to your family and friends and ask that they support and encourage you during this time. Call telephone quitlines, reach out to support groups, or work with a counselor for support.  Ask people who smoke to avoid smoking around you.  Avoid places that trigger you to smoke, such as bars, parties, or smoke-break areas at work.  Spend time around people who do not smoke.  Lessen stress in your life, because stress can be a smoking trigger for some people. To lessen stress, try: ? Exercising regularly. ? Deep-breathing exercises. ? Yoga. ? Meditating. ? Performing a body scan. This involves closing your eyes, scanning your body from head to toe, and noticing which parts of your body are particularly tense. Purposefully relax the muscles in those areas.  Download or purchase mobile phone or tablet apps (applications) that can help you stick to your quit plan by providing reminders, tips, and encouragement. There are many free apps, such as QuitGuide from the CDC (Centers for Disease Control and Prevention). You can find other support for quitting smoking (smoking cessation) through smokefree.gov and other websites.  How will I feel when I quit smoking? Within the first 24 hours of quitting smoking, you may start to feel some withdrawal symptoms. These symptoms are usually most noticeable 2-3 days after quitting, but they usually do not last beyond 2-3 weeks. Changes or symptoms that you might experience include:  Mood swings.  Restlessness, anxiety, or irritation.  Difficulty concentrating.  Dizziness.  Strong cravings for sugary foods in addition to nicotine.  Mild weight gain.  Constipation.  Nausea.  Coughing or a sore throat.  Changes in how your medicines work in your  body.  A depressed mood.  Difficulty sleeping (insomnia).  After the first 2-3 weeks of quitting, you may start to notice more positive results, such as:  Improved sense of smell and taste.  Decreased coughing and sore throat.  Slower heart rate.  Lower blood pressure.  Clearer skin.  The ability to breathe more easily.  Fewer sick days.  Quitting smoking is very challenging for most people. Do not get discouraged if you are not successful the first time. Some people need to make many attempts to quit before they achieve long-term success. Do your best to stick to your quit plan, and talk with your health care provider if you have any questions or concerns. This information is not intended to replace advice given to you by your health care provider. Make sure you discuss any questions you have with your health care provider. Document Released: 10/20/2001 Document Revised: 06/23/2016 Document Reviewed: 03/12/2015 Elsevier Interactive Patient Education  2018 [employer]  

## 2018-08-12 LAB — TOXASSURE SELECT 13 (MW), URINE

## 2018-08-30 ENCOUNTER — Other Ambulatory Visit: Payer: Self-pay | Admitting: Family

## 2018-08-30 DIAGNOSIS — J320 Chronic maxillary sinusitis: Secondary | ICD-10-CM

## 2018-08-30 DIAGNOSIS — J410 Simple chronic bronchitis: Secondary | ICD-10-CM

## 2018-09-06 ENCOUNTER — Encounter: Payer: Self-pay | Admitting: *Deleted

## 2018-09-06 ENCOUNTER — Ambulatory Visit (INDEPENDENT_AMBULATORY_CARE_PROVIDER_SITE_OTHER): Payer: Medicare PPO | Admitting: *Deleted

## 2018-09-06 VITALS — BP 126/78 | HR 74 | Ht 64.0 in | Wt 189.0 lb

## 2018-09-06 DIAGNOSIS — Z0000 Encounter for general adult medical examination without abnormal findings: Secondary | ICD-10-CM

## 2018-09-06 DIAGNOSIS — Z23 Encounter for immunization: Secondary | ICD-10-CM

## 2018-09-06 DIAGNOSIS — H9190 Unspecified hearing loss, unspecified ear: Secondary | ICD-10-CM

## 2018-09-06 NOTE — Progress Notes (Signed)
Patient is in for AWV today.  She states her hearing is decreased and she is requesting a referral to audiology at [facility].  Will forward to [facility] to advise.

## 2018-09-06 NOTE — Patient Instructions (Signed)
Please work on your goal of decreasing your sugar intake.   Please review the information on Advance Directives, and if you complete the paper work, please bring a copy to our office to be filed in your medical record.  You received your 1st Shingrix vaccine today, you will need the 2nd vaccine after 11/06/18.   Thank you for coming in for your Annual Wellness Visit today!!  Preventive Care 40-64 Years, Female Preventive care refers to lifestyle choices and visits with your health care provider that can promote health and wellness. What does preventive care include?  A yearly physical exam. This is also called an annual well check.  Dental exams once or twice a year.  Routine eye exams. Ask your health care provider how often you should have your eyes checked.  Personal lifestyle choices, including: ? Daily care of your teeth and gums. ? Regular physical activity. ? Eating a healthy diet. ? Avoiding tobacco and drug use. ? Limiting alcohol use. ? Practicing safe sex. ? Taking low-dose aspirin daily starting at age 36. ? Taking vitamin and mineral supplements as recommended by your health care provider. What happens during an annual well check? The services and screenings done by your health care provider during your annual well check will depend on your age, overall health, lifestyle risk factors, and family history of disease. Counseling Your health care provider may ask you questions about your:  Alcohol use.  Tobacco use.  Drug use.  Emotional well-being.  Home and relationship well-being.  Sexual activity.  Eating habits.  Work and work Statistician.  Method of birth control.  Menstrual cycle.  Pregnancy history.  Screening You may have the following tests or measurements:  Height, weight, and BMI.  Blood pressure.  Lipid and cholesterol levels. These may be checked every 5 years, or more frequently if you are over 33 years old.  Skin check.  Lung  cancer screening. You may have this screening every year starting at age 56 if you have a 30-pack-year history of smoking and currently smoke or have quit within the past 15 years.  Fecal occult blood test (FOBT) of the stool. You may have this test every year starting at age 40.  Flexible sigmoidoscopy or colonoscopy. You may have a sigmoidoscopy every 5 years or a colonoscopy every 10 years starting at age 47.  Hepatitis C blood test.  Hepatitis B blood test.  Sexually transmitted disease (STD) testing.  Diabetes screening. This is done by checking your blood sugar (glucose) after you have not eaten for a while (fasting). You may have this done every 1-3 years.  Mammogram. This may be done every 1-2 years. Talk to your health care provider about when you should start having regular mammograms. This may depend on whether you have a family history of breast cancer.  BRCA-related cancer screening. This may be done if you have a family history of breast, ovarian, tubal, or peritoneal cancers.  Pelvic exam and Pap test. This may be done every 3 years starting at age 43. Starting at age 28, this may be done every 5 years if you have a Pap test in combination with an HPV test.  Bone density scan. This is done to screen for osteoporosis. You may have this scan if you are at high risk for osteoporosis.  Discuss your test results, treatment options, and if necessary, the need for more tests with your health care provider. Vaccines Your health care provider may recommend certain vaccines, such as:  Influenza vaccine. This is recommended every year.  Tetanus, diphtheria, and acellular pertussis (Tdap, Td) vaccine. You may need a Td booster every 10 years.  Varicella vaccine. You may need this if you have not been vaccinated.  Zoster vaccine. You may need this after age 38.  Measles, mumps, and rubella (MMR) vaccine. You may need at least one dose of MMR if you were born in 1957 or later. You  may also need a second dose.  Pneumococcal 13-valent conjugate (PCV13) vaccine. You may need this if you have certain conditions and were not previously vaccinated.  Pneumococcal polysaccharide (PPSV23) vaccine. You may need one or two doses if you smoke cigarettes or if you have certain conditions.  Meningococcal vaccine. You may need this if you have certain conditions.  Hepatitis A vaccine. You may need this if you have certain conditions or if you travel or work in places where you may be exposed to hepatitis A.  Hepatitis B vaccine. You may need this if you have certain conditions or if you travel or work in places where you may be exposed to hepatitis B.  Haemophilus influenzae type b (Hib) vaccine. You may need this if you have certain conditions.  Talk to your health care provider about which screenings and vaccines you need and how often you need them. This information is not intended to replace advice given to you by your health care provider. Make sure you discuss any questions you have with your health care provider. Document Released: 11/22/2015 Document Revised: 07/15/2016 Document Reviewed: 08/27/2015 Elsevier Interactive Patient Education  [employer].

## 2018-09-06 NOTE — Progress Notes (Signed)
Subjective:   [name] is a 57 y.o. female who presents for an Initial Medicare Annual Wellness Visit.  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  She has 3 children, 2 step sons, and 5 grandchildren.  She lives with her husband and 2 inside dogs.  She enjoys being active in her church, sewing, [place], and painting.  She feels her health this year is improved from last year because her back pain and blood pressure are improved.  She reports no hospitalizations, ER visits, or surgeries in the past year.   Review of Systems     All negative today        Objective:    Today's Vitals   09/06/18 1120  BP: 126/78  Pulse: 74  Weight: 189 lb (85.7 kg)  Height: 5\' 4"  (1.626 m)  PainSc: 0-No pain   Body mass index is 32.44 kg/m.  Advanced Directives 09/06/2018 08/28/2017 06/30/2017 06/30/2017 07/14/2016 07/09/2016 07/09/2016  Does Patient Have a Medical Advance Directive? No No No No No No No  Would patient like information on creating a medical advance directive? Yes (MAU/Ambulatory/Procedural Areas - Information given) - No - Patient declined - No - patient declined information No - patient declined information No - patient declined information  Pre-existing out of facility DNR order (yellow form or pink MOST form) - - - - - - -    Current Medications (verified) Outpatient Encounter Medications as of 09/06/2018  Medication Sig  . ALPRAZolam (XANAX) 0.5 MG tablet TAKE 1 TABLET BY MOUTH 2 TIMES A DAY AS NEEDED  . atenolol (TENORMIN) 50 MG tablet TAKE 1 TABLET BY MOUTH ONCE DAILY WITH BREAKFAST  . buPROPion (WELLBUTRIN SR) 150 MG 12 hr tablet Take 1 tablet (150 mg total) by mouth 2 (two) times daily.  . cetirizine (ZYRTEC) 10 MG tablet TAKE 1 TABLET (10 MG TOTAL) DAILY BY MOUTH.  . citalopram (CELEXA) 40 MG tablet Take 1 tablet (40 mg total) by mouth daily with breakfast.  .  fluticasone (FLONASE) 50 MCG/ACT nasal spray Place 2 sprays into both nostrils daily.  . hydrALAZINE (APRESOLINE) 25 MG tablet Take 1 tablet (25 mg total) by mouth 3 (three) times daily as needed (blood pressure greater than 170/100).  . hydrocortisone (ANUSOL-HC) 25 MG suppository Place 1 suppository (25 mg total) rectally 2 (two) times daily.  Marland Kitchen lisinopril (PRINIVIL,ZESTRIL) 20 MG tablet Take 1 tablet (20 mg total) by mouth daily.  . ondansetron (ZOFRAN ODT) 8 MG disintegrating tablet Take 1 tablet (8 mg total) every 8 (eight) hours as needed by mouth for nausea or vomiting.  . Oxycodone HCl 10 MG TABS Take 5 mg by mouth 2 (two) times daily.   . pantoprazole (PROTONIX) 40 MG tablet TAKE ONE TABLET BY MOUTH TWICE DAILY (Patient taking differently: Take 40 mg by mouth daily. )  . Probiotic Product (PROBIOTIC DAILY PO) Take by mouth.  . traZODone (DESYREL) 100 MG tablet TAKE 1 TABLET BY MOUTH EVERYDAY AT BEDTIME  . diphenhydrAMINE (BENADRYL) 25 mg capsule Take 25 mg by mouth daily as needed for allergies.  . nitroGLYCERIN (NITROSTAT) 0.4 MG SL tablet Place 1 tablet (0.4 mg total) under the tongue every 5 (five) minutes as needed for chest pain.  . [DISCONTINUED] hydrochlorothiazide (HYDRODIURIL) 25 MG tablet Take 25 mg by mouth daily.   No facility-administered encounter medications on file as of 09/06/2018.  Allergies (verified) Amoxicillin and Nsaids   History: Past Medical History:  Diagnosis Date  . Abdominal pain   . Anxiety   . Arthritis   . Blood in stool   . Breast changes, fibrocystic   . Bruises easily   . Cardiomegaly 2018  . Clotting disorder (HCC)     itp , none since 1976, no current hematologist  . Constipation   . DDD (degenerative disc disease), cervical    with lumbar issues  . Depression   . Generalized headaches   . GERD (gastroesophageal reflux disease)   . Hemorrhoid   . Hyperlipidemia   . Hypertension   . Idiopathic thrombocytopenia ([place])    as child    . Leg swelling    both ankles  . Liver lesion   . Nasal congestion   . Nausea & vomiting   . Neuromuscular disorder ([place])    back  injury  . Neuromuscular disorder ([place])    3 neck surgeries,neck fusion.pin,plates,screws  . Osteoporosis   . Panic attacks    pt also  has germaphobia  . Rectal bleeding   . Seizures ([place]) 1998   stress induced, one time, none since, no seizure meds  . Seizures ([place])    Stated she had 2 weeks ago,it was like a transient,stare.  . Sleep apnea    STOPBANG=4  . Spleen enlarged 1976, none since   r/t idiopathic thrombocytopenia  . Trouble swallowing   . Unintentional weight loss   . Weakness    weakness varies   Past Surgical History:  Procedure Laterality Date  . ABDOMINAL HYSTERECTOMY  1997  . BACK SURGERY    . CERVICAL SPINE SURGERY  2005, 2007, 2010   x3, fusion done,plates and screws present  . [place]  . CHOLECYSTECTOMY  03/14/2012   Procedure: LAPAROSCOPIC CHOLECYSTECTOMY;  Surgeon: Stark Klein, MD;  Location: [facility];  Service: General;  Laterality: N/A;  . colonscopy and esophagogastrodudononescopy  02-04-12  . LUMBAR DISC SURGERY  11/2014   Family History  Problem Relation Age of Onset  . Hypertension Mother   . Heart disease Mother   . Lung cancer Father   . Cancer Father        lung  . Colon cancer Paternal Grandmother   . Cancer Paternal Grandmother        colon  . Heart disease Maternal Grandmother   . Diabetes Sister   . Bipolar disorder Sister   . Heart disease Sister        Congestive Heart Failure  . Hypertension Sister    Social History   Socioeconomic History  . Marital status: Married    Spouse name: Not on file  . Number of children: 3  . Years of education: Not on file  . Highest education level: Not on file  Occupational History  . Occupation: Corporate investment banker: UNEMPLOYED  Social Needs  . Financial resource strain: Not on file  . Food insecurity:    Worry: Not on file     Inability: Not on file  . Transportation needs:    Medical: Not on file    Non-medical: Not on file  Tobacco Use  . Smoking status: Current Every Day Smoker    Packs/day: 0.25    Years: 5.00    Pack years: 1.25    Types: Cigarettes    Start date: 12/20/2005  . Smokeless tobacco: Never Used  Substance and Sexual Activity  . Alcohol use:  No    Alcohol/week: 0.0 standard drinks  . Drug use: No  . Sexual activity: Yes  Lifestyle  . Physical activity:    Days per week: Not on file    Minutes per session: Not on file  . Stress: Not on file  Relationships  . Social connections:    Talks on phone: Not on file    Gets together: Not on file    Attends religious service: Not on file    Active member of club or organization: Not on file    Attends meetings of clubs or organizations: Not on file    Relationship status: Not on file  Other Topics Concern  . Not on file  Social History Narrative   She lives with her boyfriend.  She used to smoke, quit May 2013, she is not drinking. She has 3 children    Tobacco Counseling Ready to quit: Yes Counseling given: Yes   Clinical Intake:     Pain Score: 0-No pain                  Activities of Daily Living In your present state of health, do you have any difficulty performing the following activities: 09/06/2018  Hearing? Y  Comment decreased hearing both ears, interested in audiolgoy referral  Vision? Y  Comment Needs eye exam, does not wear prescription glasses, plans to go for eye exam in the next month  Difficulty concentrating or making decisions? N  Walking or climbing stairs? Y  Comment Avoids stairs due to risk of falling  Dressing or bathing? N  Doing errands, shopping? N  Preparing Food and eating ? N  Using the Toilet? N  In the past six months, have you accidently leaked urine? N  Do you have problems with loss of bowel control? N  Managing your Medications? N  Managing your Finances? N  Housekeeping or  managing your Housekeeping? N  Some recent data might be hidden     Immunizations and Health Maintenance Immunization History  Administered Date(s) Administered  . Influenza,inj,Quad PF,6+ Mos 10/06/2016, 08/05/2017, 08/08/2018  . Pneumococcal Conjugate-13 10/06/2016  . Tdap 02/04/2018  . Zoster Recombinat (Shingrix) 09/06/2018   Health Maintenance Due  Topic Date Due  . MAMMOGRAM  11/12/2016    Patient Care Team: Sharion Balloon, FNP as PCP - General (Family Medicine) Starling Manns, MD (Orthopedic Surgery) Arnoldo Lenis, MD as Consulting Physician (Cardiology)       Assessment:   This is a routine wellness examination for Kip.  Hearing/Vision screen  States she has decreased hearing, and is interested in seeing an audiologist.  Will check with Almond Lint regarding referral.   Patient states she had Lasik surgery on both eyes in 1998.  She has not worn glasses since then, and has not had an eye exam in many years.  She states she plans to go for an eye exam in the next month at [facility] or [place].   Dietary issues and exercise activities discussed: Patient states she usually eats 3 meals per day and snacks as needed.  Usually has coffee and yogurt for breakfast, soup for lunch, and various dinners.  States she eats a significant amount of sweet snacks, and she plans to reduce her intake of these.  She tries to avoid fried foods.  Recommended diet of mostly vegetables, fruits, lean proteins, and whole grains.  Healthy cooking methods discussed.   Current Exercise Habits: Home exercise routine,  Type of exercise: walking, Time (Minutes): 25, Frequency (Times/Week): 5, Weekly Exercise (Minutes/Week): 125, Intensity: Mild, Exercise limited by: orthopedic condition(s);neurologic condition(s)  Goals    . DIET - REDUCE SUGAR INTAKE     Patient states she would like to 20 lbs.       Depression Screen PHQ 2/9 Scores 09/06/2018 08/08/2018 02/04/2018  09/21/2017 08/26/2017 08/19/2017 08/05/2017  PHQ - 2 Score 1 1 0 2 2 2 6   PHQ- 9 Score - - - 7 7 7 16     Fall Risk Fall Risk  09/06/2018 08/26/2017 03/09/2017 03/05/2017 10/06/2016  Falls in the past year? No No No No No    Is the patient's home free of loose throw rugs in walkways, pet beds, electrical cords, etc?   yes      Grab bars in the bathroom? no      Handrails on the stairs?   no stairs in home      Adequate lighting?   yes    Cognitive Function: MMSE - Mini Mental State Exam 09/06/2018  Orientation to time 5  Orientation to Place 5  Registration 3  Attention/ Calculation 5  Recall 3  Language- name 2 objects 2  Language- repeat 1  Language- follow 3 step command 3  Language- read & follow direction 1  Write a sentence 1  Copy design 1  Total score 30        Screening Tests Health Maintenance  Topic Date Due  . MAMMOGRAM  11/12/2016  . PAP SMEAR  07/03/2019  . COLONOSCOPY  02/03/2022  . TETANUS/TDAP  02/05/2028  . INFLUENZA VACCINE  Completed  . Hepatitis C Screening  Completed  . HIV Screening  Completed    Qualifies for Shingles Vaccine? Received 1st dose today  Cancer Screenings: Lung: Low Dose CT Chest recommended if Age 28-80 years, 30 pack-year currently smoking OR have quit w/in 15years. Patient does not qualify. Breast: Up to date on Mammogram? No, scheduled for 10/25/18 Up to date of Bone Density/Dexa? No recommend at next visit with Evelina Dun, FNP Colorectal: up to date  Additional Screenings:  Hepatitis C Screening:  Completed 07/02/16     Plan:     Work on your goal of decreasing your sugar intake.  Review the information on Advance Directives, and if you complete the paper work, please bring a copy to our office to be filed in your medical record. You received your 1st Shingrix vaccine today, you will need the 2nd vaccine after 11/06/18.   I have personally reviewed and noted the following in the patient's chart:   . Medical and  social history . Use of alcohol, tobacco or illicit drugs  . Current medications and supplements . Functional ability and status . Nutritional status . Physical activity . Advanced directives . List of other physicians . Hospitalizations, surgeries, and ER visits in previous 12 months . Vitals . Screenings to include cognitive, depression, and falls . Referrals and appointments  In addition, I have reviewed and discussed with patient certain preventive protocols, quality metrics, and best practice recommendations. A written personalized care plan for preventive services as well as general preventive health recommendations were provided to patient.     WYATT, AMY M, RN   09/06/2018   I have reviewed and agree with the above AWV documentation.   Evelina Dun, FNP

## 2018-09-13 ENCOUNTER — Other Ambulatory Visit: Payer: Self-pay | Admitting: Family

## 2018-09-13 DIAGNOSIS — F411 Generalized anxiety disorder: Secondary | ICD-10-CM

## 2018-09-13 DIAGNOSIS — G4700 Insomnia, unspecified: Secondary | ICD-10-CM

## 2018-09-13 DIAGNOSIS — F331 Major depressive disorder, recurrent, moderate: Secondary | ICD-10-CM

## 2018-09-13 DIAGNOSIS — I10 Essential (primary) hypertension: Secondary | ICD-10-CM

## 2018-09-29 ENCOUNTER — Other Ambulatory Visit: Payer: Self-pay | Admitting: Orthopaedic Surgery

## 2018-09-29 DIAGNOSIS — M542 Cervicalgia: Secondary | ICD-10-CM

## 2018-09-29 DIAGNOSIS — M546 Pain in thoracic spine: Secondary | ICD-10-CM

## 2018-10-12 ENCOUNTER — Other Ambulatory Visit: Payer: Medicare PPO

## 2018-10-18 ENCOUNTER — Ambulatory Visit
Admission: RE | Admit: 2018-10-18 | Discharge: 2018-10-18 | Disposition: A | Discharge status: Home / Self Care | Payer: Medicare PPO | Source: Ambulatory Visit | Attending: Orthopaedic Surgery | Admitting: Orthopaedic Surgery

## 2018-10-18 DIAGNOSIS — M546 Pain in thoracic spine: Secondary | ICD-10-CM

## 2018-10-18 DIAGNOSIS — M542 Cervicalgia: Secondary | ICD-10-CM

## 2018-10-24 ENCOUNTER — Other Ambulatory Visit: Payer: Self-pay | Admitting: Family

## 2018-10-24 DIAGNOSIS — F411 Generalized anxiety disorder: Secondary | ICD-10-CM

## 2018-10-24 DIAGNOSIS — F331 Major depressive disorder, recurrent, moderate: Secondary | ICD-10-CM

## 2018-11-04 LAB — HM MAMMOGRAPHY

## 2018-11-10 MED ORDER — ALBUTEROL SULFATE HFA 108 (90 BASE) MCG/ACT IN AERS: 2.0000 | INHALATION_SPRAY | Freq: Four times a day (QID) | RESPIRATORY_TRACT | 0 refills | Status: DC | PRN

## 2018-11-17 ENCOUNTER — Other Ambulatory Visit: Payer: Self-pay | Admitting: Physician Assistant

## 2018-11-29 ENCOUNTER — Other Ambulatory Visit: Payer: Self-pay | Admitting: Family

## 2018-11-29 DIAGNOSIS — F331 Major depressive disorder, recurrent, moderate: Secondary | ICD-10-CM

## 2018-11-29 DIAGNOSIS — F411 Generalized anxiety disorder: Secondary | ICD-10-CM

## 2018-11-30 NOTE — Telephone Encounter (Signed)
Ov 02/07/19

## 2018-12-04 ENCOUNTER — Other Ambulatory Visit: Payer: Self-pay | Admitting: Family Medicine

## 2018-12-05 ENCOUNTER — Other Ambulatory Visit: Payer: Self-pay | Admitting: Family

## 2018-12-05 DIAGNOSIS — G4700 Insomnia, unspecified: Secondary | ICD-10-CM

## 2018-12-05 DIAGNOSIS — I10 Essential (primary) hypertension: Secondary | ICD-10-CM

## 2018-12-05 NOTE — Telephone Encounter (Signed)
OV 08/08/18 rtc 6 mos. Next OV 02/07/19

## 2019-02-07 ENCOUNTER — Ambulatory Visit: Payer: Medicare PPO | Admitting: Family

## 2019-02-10 ENCOUNTER — Ambulatory Visit: Payer: Medicare PPO | Admitting: Family

## 2019-02-10 ENCOUNTER — Encounter: Payer: Self-pay | Admitting: Family

## 2019-02-10 ENCOUNTER — Other Ambulatory Visit: Payer: Self-pay

## 2019-02-10 VITALS — BP 149/94 | HR 90 | Temp 97.1°F | Ht 64.0 in | Wt 196.0 lb

## 2019-02-10 DIAGNOSIS — F17200 Nicotine dependence, unspecified, uncomplicated: Secondary | ICD-10-CM

## 2019-02-10 DIAGNOSIS — F1320 Sedative, hypnotic or anxiolytic dependence, uncomplicated: Secondary | ICD-10-CM

## 2019-02-10 DIAGNOSIS — I10 Essential (primary) hypertension: Secondary | ICD-10-CM | POA: Diagnosis not present

## 2019-02-10 DIAGNOSIS — G4700 Insomnia, unspecified: Secondary | ICD-10-CM

## 2019-02-10 DIAGNOSIS — F411 Generalized anxiety disorder: Secondary | ICD-10-CM

## 2019-02-10 DIAGNOSIS — E669 Obesity, unspecified: Secondary | ICD-10-CM

## 2019-02-10 DIAGNOSIS — Z79899 Other long term (current) drug therapy: Secondary | ICD-10-CM | POA: Diagnosis not present

## 2019-02-10 DIAGNOSIS — F331 Major depressive disorder, recurrent, moderate: Secondary | ICD-10-CM

## 2019-02-10 DIAGNOSIS — E785 Hyperlipidemia, unspecified: Secondary | ICD-10-CM

## 2019-02-10 DIAGNOSIS — K219 Gastro-esophageal reflux disease without esophagitis: Secondary | ICD-10-CM

## 2019-02-10 LAB — CBC WITH DIFFERENTIAL/PLATELET
Basophils Absolute: 0.1 10*3/uL (ref 0.0–0.2)
Basos: 1 %
EOS (ABSOLUTE): 0.7 10*3/uL — ABNORMAL HIGH (ref 0.0–0.4)
Eos: 7 %
Hematocrit: 43.8 % (ref 34.0–46.6)
Hemoglobin: 14.6 g/dL (ref 11.1–15.9)
Immature Grans (Abs): 0.1 10*3/uL (ref 0.0–0.1)
Immature Granulocytes: 1 %
Lymphocytes Absolute: 3.9 10*3/uL — ABNORMAL HIGH (ref 0.7–3.1)
Lymphs: 36 %
MCH: 27.5 pg (ref 26.6–33.0)
MCHC: 33.3 g/dL (ref 31.5–35.7)
MCV: 83 fL (ref 79–97)
Monocytes Absolute: 1 10*3/uL — ABNORMAL HIGH (ref 0.1–0.9)
Monocytes: 9 %
Neutrophils Absolute: 5.1 10*3/uL (ref 1.4–7.0)
Neutrophils: 46 %
Platelets: 311 10*3/uL (ref 150–450)
RBC: 5.3 x10E6/uL — ABNORMAL HIGH (ref 3.77–5.28)
RDW: 13.5 % (ref 11.7–15.4)
WBC: 10.9 10*3/uL — ABNORMAL HIGH (ref 3.4–10.8)

## 2019-02-10 LAB — CMP14+EGFR
ALT: 24 IU/L (ref 0–32)
AST: 21 IU/L (ref 0–40)
Albumin/Globulin Ratio: 1.7 (ref 1.2–2.2)
Albumin: 4.3 g/dL (ref 3.8–4.9)
Alkaline Phosphatase: 114 IU/L (ref 39–117)
BUN/Creatinine Ratio: 14 (ref 9–23)
BUN: 12 mg/dL (ref 6–24)
Bilirubin Total: 0.2 mg/dL (ref 0.0–1.2)
CO2: 26 mmol/L (ref 20–29)
Calcium: 9.7 mg/dL (ref 8.7–10.2)
Chloride: 97 mmol/L (ref 96–106)
Creatinine, Ser: 0.85 mg/dL (ref 0.57–1.00)
GFR calc Af Amer: 87 mL/min/{1.73_m2} (ref >59)
GFR calc non Af Amer: 75 mL/min/{1.73_m2} (ref >59)
Globulin, Total: 2.5 g/dL (ref 1.5–4.5)
Glucose: 94 mg/dL (ref 65–99)
Potassium: 4.7 mmol/L (ref 3.5–5.2)
Sodium: 137 mmol/L (ref 134–144)
Total Protein: 6.8 g/dL (ref 6.0–8.5)

## 2019-02-10 LAB — LIPID PANEL
Chol/HDL Ratio: 4.7 ratio — ABNORMAL HIGH (ref 0.0–4.4)
Cholesterol, Total: 187 mg/dL (ref 100–199)
HDL: 40 mg/dL (ref >39)
LDL Calculated: 96 mg/dL (ref 0–99)
Triglycerides: 257 mg/dL — ABNORMAL HIGH (ref 0–149)
VLDL Cholesterol Cal: 51 mg/dL — ABNORMAL HIGH (ref 5–40)

## 2019-02-10 MED ORDER — ALPRAZOLAM 0.5 MG PO TABS: ORAL_TABLET | ORAL | 5 refills | Status: DC

## 2019-02-10 NOTE — Progress Notes (Signed)
Subjective:    Patient ID: [name], female    DOB: 14-Jul-1961, 58 y.o.   MRN: [id number]  No chief complaint on file.  PT presents to the office today for chronic follow up PT has surgery on L4-S1 on 03/22/17, she is followed by her Neurosurgeon every 1-2 months. Pt is followed by Cardiologists every 6 months.  Hypertension  This is a chronic problem. The current episode started more than 1 year ago. The problem has been waxing and waning since onset. The problem is uncontrolled. Associated symptoms include anxiety. Pertinent negatives include no headaches, malaise/fatigue, peripheral edema or shortness of breath. Risk factors for coronary artery disease include dyslipidemia, obesity, sedentary lifestyle and smoking/tobacco exposure. The current treatment provides moderate improvement. There is no history of heart failure.  Gastroesophageal Reflux  She complains of heartburn. She reports no belching or no choking. This is a chronic problem. The current episode started more than 1 year ago. The problem occurs occasionally. The problem has been waxing and waning. The symptoms are aggravated by certain foods. Risk factors include obesity and smoking/tobacco exposure. She has tried a PPI for the symptoms. The treatment provided mild relief.  Hyperlipidemia  This is a chronic problem. The current episode started more than 1 year ago. The problem is uncontrolled. Recent lipid tests were reviewed and are high. Exacerbating diseases include obesity. Pertinent negatives include no shortness of breath. Current antihyperlipidemic treatment includes statins and diet change. The current treatment provides moderate improvement of lipids. Risk factors for coronary artery disease include dyslipidemia, female sex, hypertension, a sedentary lifestyle and post-menopausal.  Anxiety  Presents for follow-up visit. Symptoms include decreased concentration, depressed mood, excessive worry, insomnia, irritability,  nervous/anxious behavior and panic. Patient reports no shortness of breath. Symptoms occur most days. The severity of symptoms is moderate. The quality of sleep is good.    Depression         This is a chronic problem.  The current episode started more than 1 year ago.   The onset quality is gradual.   The problem occurs intermittently.  The problem has been waxing and waning since onset.  Associated symptoms include decreased concentration, insomnia, irritable, decreased interest and sad.  Associated symptoms include no helplessness, no hopelessness and no headaches.  Past treatments include SSRIs - Selective serotonin reuptake inhibitors.  Past medical history includes anxiety.       Review of Systems  Constitutional: Positive for irritability. Negative for malaise/fatigue.  Respiratory: Negative for choking and shortness of breath.   Gastrointestinal: Positive for heartburn.  Neurological: Negative for headaches.  Psychiatric/Behavioral: Positive for decreased concentration and depression. The patient is nervous/anxious and has insomnia.   All other systems reviewed and are negative.      Objective:   Physical Exam Vitals signs reviewed.  Constitutional:      General: She is irritable. She is not in acute distress.    Appearance: She is well-developed.  HENT:     Head: Normocephalic and atraumatic.     Comments: Hoarse voice     Right Ear: Tympanic membrane normal.     Left Ear: Tympanic membrane normal.  Eyes:     Pupils: Pupils are equal, round, and reactive to light.  Neck:     Musculoskeletal: Normal range of motion and neck supple.     Thyroid: No thyromegaly.  Cardiovascular:     Rate and Rhythm: Normal rate and regular rhythm.     Heart sounds: Normal heart sounds.  No murmur.  Pulmonary:     Effort: Pulmonary effort is normal. No respiratory distress.     Breath sounds: Normal breath sounds. No wheezing.  Abdominal:     General: Bowel sounds are normal. There is  no distension.     Palpations: Abdomen is soft.     Tenderness: There is no abdominal tenderness.  Musculoskeletal: Normal range of motion.        General: No tenderness.  Skin:    General: Skin is warm and dry.  Neurological:     Mental Status: She is alert and oriented to person, place, and time.     Cranial Nerves: No cranial nerve deficit.     Deep Tendon Reflexes: Reflexes are normal and symmetric.  Psychiatric:        Behavior: Behavior normal.        Thought Content: Thought content normal.        Judgment: Judgment normal.       BP (!) 153/97   Pulse 91   Temp (!) 97.1 F (36.2 C) (Oral)   Ht _0  (1.626 m)   Wt 196 lb (88.9 kg)   BMI 33.64 kg/m      Assessment & Plan:  [name] comes in today with chief complaint of Medical Management of Chronic Issues   Diagnosis and orders addressed:  1. Essential hypertension - CMP14+EGFR - CBC with Differential/Platelet  2. Gastroesophageal reflux disease, esophagitis presence not specified - CMP14+EGFR - CBC with Differential/Platelet  3. Benzodiazepine dependence (HCC) - CMP14+EGFR - CBC with Differential/Platelet - ToxASSURE Select 13 (MW), Urine - ALPRAZolam (XANAX) 0.5 MG tablet; TAKE 1 TABLET BY MOUTH 2 TIMES A DAY AS NEEDED  Dispense: 60 tablet; Refill: 5  4. Controlled substance agreement signed - CMP14+EGFR - CBC with Differential/Platelet - ToxASSURE Select 13 (MW), Urine - ALPRAZolam (XANAX) 0.5 MG tablet; TAKE 1 TABLET BY MOUTH 2 TIMES A DAY AS NEEDED  Dispense: 60 tablet; Refill: 5  5. Current smoker - CMP14+EGFR - CBC with Differential/Platelet  6. Moderate episode of recurrent major depressive disorder (HCC) - CMP14+EGFR - CBC with Differential/Platelet  7. GAD (generalized anxiety disorder) - CMP14+EGFR - CBC with Differential/Platelet - ALPRAZolam (XANAX) 0.5 MG tablet; TAKE 1 TABLET BY MOUTH 2 TIMES A DAY AS NEEDED  Dispense: 60 tablet; Refill: 5  8. Hyperlipidemia,  unspecified hyperlipidemia type - CMP14+EGFR - CBC with Differential/Platelet - Lipid panel  9. Insomnia, unspecified type - CMP14+EGFR - CBC with Differential/Platelet  10. Obesity (BMI 30-39.9) - CMP14+EGFR - CBC with Differential/Platelet   Labs pending Pt reviewed in [place] controlled database- No red flags noted Health Maintenance reviewed Diet and exercise encouraged  Follow up plan: 6 months    Evelina Dun, FNP

## 2019-02-10 NOTE — Patient Instructions (Signed)

## 2019-02-13 ENCOUNTER — Other Ambulatory Visit: Payer: Self-pay | Admitting: Family

## 2019-02-13 LAB — TOXASSURE SELECT 13 (MW), URINE

## 2019-02-13 MED ORDER — ROSUVASTATIN CALCIUM 10 MG PO TABS: 10.0000 mg | ORAL_TABLET | Freq: Every day | ORAL | 1 refills | Status: DC

## 2019-02-28 ENCOUNTER — Other Ambulatory Visit: Payer: Self-pay | Admitting: Family

## 2019-02-28 DIAGNOSIS — F331 Major depressive disorder, recurrent, moderate: Secondary | ICD-10-CM

## 2019-02-28 DIAGNOSIS — F411 Generalized anxiety disorder: Secondary | ICD-10-CM

## 2019-02-28 DIAGNOSIS — J320 Chronic maxillary sinusitis: Secondary | ICD-10-CM

## 2019-02-28 DIAGNOSIS — J410 Simple chronic bronchitis: Secondary | ICD-10-CM

## 2019-03-09 ENCOUNTER — Other Ambulatory Visit: Payer: Self-pay | Admitting: Family

## 2019-03-09 DIAGNOSIS — J410 Simple chronic bronchitis: Secondary | ICD-10-CM

## 2019-03-09 DIAGNOSIS — G4700 Insomnia, unspecified: Secondary | ICD-10-CM

## 2019-03-09 DIAGNOSIS — J320 Chronic maxillary sinusitis: Secondary | ICD-10-CM

## 2019-03-13 ENCOUNTER — Other Ambulatory Visit: Payer: Self-pay | Admitting: Family

## 2019-03-13 DIAGNOSIS — I10 Essential (primary) hypertension: Secondary | ICD-10-CM

## 2019-04-23 ENCOUNTER — Other Ambulatory Visit: Payer: Self-pay | Admitting: Family

## 2019-04-23 DIAGNOSIS — F411 Generalized anxiety disorder: Secondary | ICD-10-CM

## 2019-04-23 DIAGNOSIS — F331 Major depressive disorder, recurrent, moderate: Secondary | ICD-10-CM

## 2019-04-23 DIAGNOSIS — Z716 Tobacco abuse counseling: Secondary | ICD-10-CM

## 2019-05-24 ENCOUNTER — Other Ambulatory Visit: Payer: Self-pay | Admitting: Orthopaedic Surgery

## 2019-05-24 DIAGNOSIS — M4326 Fusion of spine, lumbar region: Secondary | ICD-10-CM

## 2019-05-25 ENCOUNTER — Other Ambulatory Visit: Payer: Self-pay | Admitting: Nurse Practitioner

## 2019-05-25 DIAGNOSIS — F331 Major depressive disorder, recurrent, moderate: Secondary | ICD-10-CM

## 2019-05-25 DIAGNOSIS — F411 Generalized anxiety disorder: Secondary | ICD-10-CM

## 2019-06-05 ENCOUNTER — Other Ambulatory Visit: Payer: Self-pay | Admitting: Family

## 2019-06-05 DIAGNOSIS — I10 Essential (primary) hypertension: Secondary | ICD-10-CM

## 2019-06-05 DIAGNOSIS — G4700 Insomnia, unspecified: Secondary | ICD-10-CM

## 2019-06-06 NOTE — Telephone Encounter (Signed)
02/10/19 OV rtc 6 mos

## 2019-06-12 ENCOUNTER — Other Ambulatory Visit: Payer: Self-pay

## 2019-06-12 ENCOUNTER — Ambulatory Visit (INDEPENDENT_AMBULATORY_CARE_PROVIDER_SITE_OTHER): Payer: Medicare PPO | Admitting: Family Medicine

## 2019-06-12 DIAGNOSIS — Z7189 Other specified counseling: Secondary | ICD-10-CM

## 2019-06-12 DIAGNOSIS — R059 Cough, unspecified: Secondary | ICD-10-CM

## 2019-06-12 DIAGNOSIS — R05 Cough: Secondary | ICD-10-CM

## 2019-06-12 DIAGNOSIS — J0100 Acute maxillary sinusitis, unspecified: Secondary | ICD-10-CM | POA: Diagnosis not present

## 2019-06-12 MED ORDER — ALBUTEROL SULFATE HFA 108 (90 BASE) MCG/ACT IN AERS: 2.0000 | INHALATION_SPRAY | Freq: Four times a day (QID) | RESPIRATORY_TRACT | 1 refills | Status: DC | PRN

## 2019-06-12 MED ORDER — DOXYCYCLINE HYCLATE 100 MG PO TABS: 100.0000 mg | ORAL_TABLET | Freq: Two times a day (BID) | ORAL | 0 refills | Status: DC

## 2019-06-12 MED ORDER — BENZONATATE 200 MG PO CAPS: 200.0000 mg | ORAL_CAPSULE | Freq: Two times a day (BID) | ORAL | 0 refills | Status: DC | PRN

## 2019-06-12 NOTE — Patient Instructions (Signed)
Prevent the Spread of COVID-19 if You Are Sick If you are sick with COVID-19 or think you might have COVID-19, follow the steps below to help protect other people in your home and community. Stay home except to get medical care.  Stay home. Most people with COVID-19 have mild illness and are able to recover at home without medical care. Do not leave your home, except to get medical care. Do not visit public areas.  Take care of yourself. Get rest and stay hydrated.  Get medical care when needed. Call your doctor before you go to their office for care. But, if you have trouble breathing or other concerning symptoms, call 911 for immediate help.  Avoid public transportation, ride-sharing, or taxis. Separate yourself from other people and pets in your home.  As much as possible, stay in a specific room and away from other people and pets in your home. Also, you should use a separate bathroom, if available. If you need to be around other people or animals in or outside of the home, wear a cloth face covering. ? See COVID-19 and Animals if you have questions about pets: https://www.cdc.gov/coronavirus/2019-ncov/faq.html#COVID19animals Monitor your symptoms.  Common symptoms of COVID-19 include fever and cough. Trouble breathing is a more serious symptom that means you should get medical attention.  Follow care instructions from your healthcare provider and local health department. Your local health authorities will give instructions on checking your symptoms and reporting information. If you develop emergency warning signs for COVID-19 get medical attention immediately.  Emergency warning signs include*:  Trouble breathing  Persistent pain or pressure in the chest  New confusion or not able to be woken  Bluish lips or face *This list is not all inclusive. Please consult your medical provider for any other symptoms that are severe or concerning to you. Call 911 if you have a medical  emergency. If you have a medical emergency and need to call 911, notify the operator that you have or think you might have, COVID-19. If possible, put on a facemask before medical help arrives. Call ahead before visiting your doctor.  Call ahead. Many medical visits for routine care are being postponed or done by phone or telemedicine.  If you have a medical appointment that cannot be postponed, call your doctor's office. This will help the office protect themselves and other patients. If you are sick, wear a cloth covering over your nose and mouth.  You should wear a cloth face covering over your nose and mouth if you must be around other people or animals, including pets (even at home).  You don't need to wear the cloth face covering if you are alone. If you can't put on a cloth face covering (because of trouble breathing for example), cover your coughs and sneezes in some other way. Try to stay at least 6 feet away from other people. This will help protect the people around you. Note: During the COVID-19 pandemic, medical grade facemasks are reserved for healthcare workers and some first responders. You may need to make a cloth face covering using a scarf or bandana. Cover your coughs and sneezes.  Cover your mouth and nose with a tissue when you cough or sneeze.  Throw used tissues in a lined trash can.  Immediately wash your hands with soap and water for at least 20 seconds. If soap and water are not available, clean your hands with an alcohol-based hand sanitizer that contains at least 60% alcohol. Clean your hands often.    Wash your hands often with soap and water for at least 20 seconds. This is especially important after blowing your nose, coughing, or sneezing; going to the bathroom; and before eating or preparing food.  Use hand sanitizer if soap and water are not available. Use an alcohol-based hand sanitizer with at least 60% alcohol, covering all surfaces of your hands and rubbing  them together until they feel dry.  Soap and water are the best option, especially if your hands are visibly dirty.  Avoid touching your eyes, nose, and mouth with unwashed hands. Avoid sharing personal household items.  Do not share dishes, drinking glasses, cups, eating utensils, towels, or bedding with other people in your home.  Wash these items thoroughly after using them with soap and water or put them in the dishwasher. Clean all "high-touch" surfaces everyday.  Clean and disinfect high-touch surfaces in your "sick room" and bathroom. Let someone else clean and disinfect surfaces in common areas, but not your bedroom and bathroom.  If a caregiver or other person needs to clean and disinfect a sick person's bedroom or bathroom, they should do so on an as-needed basis. The caregiver/other person should wear a mask and wait as long as possible after the sick person has used the bathroom. High-touch surfaces include phones, remote controls, counters, tabletops, doorknobs, bathroom fixtures, toilets, keyboards, tablets, and bedside tables.  Clean and disinfect areas that may have blood, stool, or body fluids on them.  Use household cleaners and disinfectants. Clean the area or item with soap and water or another detergent if it is dirty. Then use a household disinfectant. ? Be sure to follow the instructions on the label to ensure safe and effective use of the product. Many products recommend keeping the surface wet for several minutes to ensure germs are killed. Many also recommend precautions such as wearing gloves and making sure you have good ventilation during use of the product. ? Most EPA-registered household disinfectants should be effective. How to discontinue home isolation  People with COVID-19 who have stayed home (home isolated) can stop home isolation under the following conditions: ? If you will not have a test to determine if you are still contagious, you can leave home  after these three things have happened:  You have had no fever for at least 72 hours (that is three full days of no fever without the use of medicine that reduces fevers) AND  other symptoms have improved (for example, when your cough or shortness of breath has improved) AND  at least 10 days have passed since your symptoms first appeared. ? If you will be tested to determine if you are still contagious, you can leave home after these three things have happened:  You no longer have a fever (without the use of medicine that reduces fevers) AND  other symptoms have improved (for example, when your cough or shortness of breath has improved) AND  you received two negative tests in a row, 24 hours apart. Your doctor will follow CDC guidelines. In all cases, follow the guidance of your healthcare provider and local health department. The decision to stop home isolation should be made in consultation with your healthcare provider and state and local health departments. Local decisions depend on local circumstances. cdc.gov/coronavirus 03/12/2019 This information is not intended to replace advice given to you by your health care provider. Make sure you discuss any questions you have with your health care provider. Document Released: 02/21/2019 Document Revised: 03/22/2019 Document Reviewed: 02/21/2019   Elsevier Patient Education  2020 [employer]  

## 2019-06-12 NOTE — Progress Notes (Signed)
Telephone visit  Subjective: CC:URI PCP: Sharion Balloon, FNP [id number] [name] is Betty 58 y.o. female calls for telephone consult today. Patient provides verbal consent for consult held via phone.  Location of patient: home Location of provider: [facility] Others present for call: none  1. URI Patient reports that she started having ear pain, sinus symptoms on Friday.  She reports productive cough. No hemoptysis.  Denies SOB, wheeze (outside of normal), diarrhea, vomiting.  She reports sinus headache, subjective fever, facial pain.  She is Betty smoker.  There is concern for possible COPD but she does not have formal diagnosis.  She does not work with the public, she is disabled.  She does report recent travel to the [place] area but denies any known sick contacts.   ROS: Per HPI  Allergies  Allergen Reactions  . Amoxicillin Diarrhea  . Nsaids Nausea Only    Abdominal pain   Past Medical History:  Diagnosis Date  . Abdominal pain   . Anxiety   . Arthritis   . Blood in stool   . Breast changes, fibrocystic   . Bruises easily   . Cardiomegaly 2018  . Clotting disorder (HCC)     itp , none since 1976, no current hematologist  . Constipation   . DDD (degenerative disc disease), cervical    with lumbar issues  . Depression   . Generalized headaches   . GERD (gastroesophageal reflux disease)   . Hemorrhoid   . Hyperlipidemia   . Hypertension   . Idiopathic thrombocytopenia ([place])    as child  . Leg swelling    both ankles  . Liver lesion   . Nasal congestion   . Nausea & vomiting   . Neuromuscular disorder ([place])    back  injury  . Neuromuscular disorder ([place])    3 neck surgeries,neck fusion.pin,plates,screws  . Osteoporosis   . Panic attacks    pt also  has germaphobia  . Rectal bleeding   . Seizures ([place]) 1998   stress induced, one time, none since, no seizure meds  . Seizures ([place])    Stated she had 2 weeks ago,it was like Betty transient,stare.  . Sleep apnea     STOPBANG=4  . Spleen enlarged 1976, none since   r/t idiopathic thrombocytopenia  . Trouble swallowing   . Unintentional weight loss   . Weakness    weakness varies    Current Outpatient Medications:  .  albuterol (PROVENTIL HFA;VENTOLIN HFA) 108 (90 Base) MCG/ACT inhaler, TAKE 2 PUFFS BY MOUTH EVERY 6 HOURS AS NEEDED FOR WHEEZE OR SHORTNESS OF BREATH, Disp: 6.7 Inhaler, Rfl: 0 .  ALPRAZolam (XANAX) 0.5 MG tablet, TAKE 1 TABLET BY MOUTH 2 TIMES Betty DAY AS NEEDED, Disp: 60 tablet, Rfl: 5 .  atenolol (TENORMIN) 50 MG tablet, TAKE 1 TABLET BY MOUTH ONCE DAILY WITH BREAKFAST, Disp: 90 tablet, Rfl: 0 .  buPROPion (WELLBUTRIN SR) 150 MG 12 hr tablet, TAKE 1 TABLET BY MOUTH TWICE Betty DAY, Disp: 180 tablet, Rfl: 1 .  cetirizine (ZYRTEC) 10 MG tablet, TAKE 1 TABLET (10 MG TOTAL) DAILY BY MOUTH., Disp: 90 tablet, Rfl: 1 .  citalopram (CELEXA) 40 MG tablet, TAKE 1 TABLET (40 MG TOTAL) BY MOUTH DAILY WITH BREAKFAST., Disp: 90 tablet, Rfl: 0 .  diphenhydrAMINE (BENADRYL) 25 mg capsule, Take 25 mg by mouth daily as needed for allergies., Disp: , Rfl:  .  fluticasone (FLONASE) 50 MCG/ACT nasal spray, SPRAY 2 SPRAYS INTO EACH NOSTRIL EVERY DAY, Disp:  48 g, Rfl: 1 .  hydrALAZINE (APRESOLINE) 25 MG tablet, Take 1 tablet (25 mg total) by mouth 3 (three) times daily as needed (blood pressure greater than 170/100)., Disp: 270 tablet, Rfl: 0 .  lisinopril (ZESTRIL) 10 MG tablet, TAKE 2 TABLETS BY MOUTH EVERY DAY, Disp: 180 tablet, Rfl: 0 .  nitroGLYCERIN (NITROSTAT) 0.4 MG SL tablet, Place 1 tablet (0.4 mg total) under the tongue every 5 (five) minutes as needed for chest pain., Disp: 25 tablet, Rfl: 3 .  Oxycodone HCl 10 MG TABS, Take 5 mg by mouth 2 (two) times daily. , Disp: , Rfl:  .  pantoprazole (PROTONIX) 40 MG tablet, TAKE 1 TABLET (40 MG TOTAL) 2 (TWO) TIMES DAILY BY MOUTH., Disp: 180 tablet, Rfl: 4 .  Probiotic Product (PROBIOTIC DAILY PO), Take by mouth., Disp: , Rfl:  .  rosuvastatin (CRESTOR) 10 MG  tablet, Take 1 tablet (10 mg total) by mouth daily., Disp: 90 tablet, Rfl: 1 .  traZODone (DESYREL) 100 MG tablet, TAKE 1 TABLET BY MOUTH EVERYDAY AT BEDTIME, Disp: 90 tablet, Rfl: 0  Assessment/ Plan: 58 y.o. female   1. Acute non-recurrent maxillary sinusitis We will empirically cover for acute bacterial sinusitis with doxycycline.  I have gone ahead and ordered the coronavirus test as well as we cannot rule that out at this time.  Recommended isolation and proper hygiene as outlined by CDC. - doxycycline (VIBRA-TABS) 100 MG tablet; Take 1 tablet (100 mg total) by mouth 2 (two) times daily.  Dispense: 20 tablet; Refill: 0 - Novel Coronavirus, NAA ([facility])  2. Cough We will cover for possible bacterial infection given possible underlying COPD without diagnosis.  Tessalon Perles also sent.  We discussed red flag signs and symptoms warranting further evaluation emergency department.  Patient was good understanding will follow-up PRN - albuterol (VENTOLIN HFA) 108 (90 Base) MCG/ACT inhaler; Inhale 2 puffs into the lungs every 6 (six) hours as needed for wheezing or shortness of breath.  Dispense: 6.7 g; Refill: 1 - doxycycline (VIBRA-TABS) 100 MG tablet; Take 1 tablet (100 mg total) by mouth 2 (two) times daily.  Dispense: 20 tablet; Refill: 0 - benzonatate (TESSALON) 200 MG capsule; Take 1 capsule (200 mg total) by mouth 2 (two) times daily as needed for cough.  Dispense: 20 capsule; Refill: 0 - Novel Coronavirus, NAA ([facility])  3. Educated About Covid-19 Virus Infection As above - Novel Coronavirus, NAA ([facility])   Start time: 1:21pm End time: 1:29pm  Total time spent on patient care (including telephone call/ virtual visit): 15 minutes  [place], [place] [phone]

## 2019-06-13 ENCOUNTER — Other Ambulatory Visit: Payer: Self-pay

## 2019-06-13 DIAGNOSIS — Z20822 Contact with and (suspected) exposure to covid-19: Secondary | ICD-10-CM

## 2019-06-14 LAB — NOVEL CORONAVIRUS, NAA: SARS-CoV-2, NAA: NOT DETECTED

## 2019-06-16 ENCOUNTER — Ambulatory Visit
Admission: RE | Admit: 2019-06-16 | Discharge: 2019-06-16 | Disposition: A | Discharge status: Home / Self Care | Payer: Medicare PPO | Source: Ambulatory Visit | Attending: Orthopaedic Surgery | Admitting: Orthopaedic Surgery

## 2019-06-16 ENCOUNTER — Other Ambulatory Visit: Payer: Self-pay

## 2019-06-16 DIAGNOSIS — M4326 Fusion of spine, lumbar region: Secondary | ICD-10-CM

## 2019-08-03 ENCOUNTER — Telehealth: Payer: Self-pay | Admitting: Cardiology

## 2019-08-03 NOTE — Telephone Encounter (Signed)
FYI.  °Contacted patient regarding recall appointment, patient notified our office they did not wish to keep this appointment at this time.  Deleted recall from system. °

## 2019-08-04 ENCOUNTER — Other Ambulatory Visit: Payer: Self-pay | Admitting: Family Medicine

## 2019-08-04 DIAGNOSIS — J0100 Acute maxillary sinusitis, unspecified: Secondary | ICD-10-CM

## 2019-08-04 DIAGNOSIS — R059 Cough, unspecified: Secondary | ICD-10-CM

## 2019-08-04 DIAGNOSIS — R05 Cough: Secondary | ICD-10-CM

## 2019-08-07 ENCOUNTER — Ambulatory Visit (INDEPENDENT_AMBULATORY_CARE_PROVIDER_SITE_OTHER): Payer: Medicare PPO | Admitting: Family Medicine

## 2019-08-07 DIAGNOSIS — R05 Cough: Secondary | ICD-10-CM | POA: Diagnosis not present

## 2019-08-07 DIAGNOSIS — F17200 Nicotine dependence, unspecified, uncomplicated: Secondary | ICD-10-CM | POA: Diagnosis not present

## 2019-08-07 DIAGNOSIS — R059 Cough, unspecified: Secondary | ICD-10-CM

## 2019-08-07 DIAGNOSIS — Z7189 Other specified counseling: Secondary | ICD-10-CM

## 2019-08-07 MED ORDER — AZITHROMYCIN 250 MG PO TABS: ORAL_TABLET | ORAL | 0 refills | Status: DC

## 2019-08-07 MED ORDER — PREDNISONE 20 MG PO TABS: 40.0000 mg | ORAL_TABLET | Freq: Every day | ORAL | 0 refills | Status: AC

## 2019-08-07 MED ORDER — BENZONATATE 200 MG PO CAPS: 200.0000 mg | ORAL_CAPSULE | Freq: Two times a day (BID) | ORAL | 0 refills | Status: DC | PRN

## 2019-08-07 NOTE — Progress Notes (Signed)
Telephone visit  Subjective: CC: cough PCP: Sharion Balloon, FNP II:[id number] [name] is Betty 58 y.o. female calls for telephone consult today. Patient provides verbal consent for consult held via phone.  Location of patient: home Location of provider: Working remotely from home Others present for call: none  1. Cough Patient reports she has been coughing all weekend.  She has been having Betty fever to 99.84F.  She reports productive cough.  No hemoptysis.  She feels short of breath.  She was sick in August which got better after antibiotics.  She has been using the albuterol inhaler 3-4 times daily with some relief.  She is an everyday smoker and again was previously told that she likely had COPD but has never been formally evaluated diagnosed.  She has not been around anybody that she knows of that is been sick.  She was tested for coronavirus in August and that was negative.   ROS: Per HPI  Allergies  Allergen Reactions  . Amoxicillin Diarrhea  . Nsaids Nausea Only    Abdominal pain   Past Medical History:  Diagnosis Date  . Abdominal pain   . Anxiety   . Arthritis   . Blood in stool   . Breast changes, fibrocystic   . Bruises easily   . Cardiomegaly 2018  . Clotting disorder (HCC)     itp , none since 1976, no current hematologist  . Constipation   . DDD (degenerative disc disease), cervical    with lumbar issues  . Depression   . Generalized headaches   . GERD (gastroesophageal reflux disease)   . Hemorrhoid   . Hyperlipidemia   . Hypertension   . Idiopathic thrombocytopenia ([place])    as child  . Leg swelling    both ankles  . Liver lesion   . Nasal congestion   . Nausea & vomiting   . Neuromuscular disorder ([place])    back  injury  . Neuromuscular disorder ([place])    3 neck surgeries,neck fusion.pin,plates,screws  . Osteoporosis   . Panic attacks    pt also  has germaphobia  . Rectal bleeding   . Seizures ([place]) 1998   stress induced, one time, none since, no  seizure meds  . Seizures ([place])    Stated she had 2 weeks ago,it was like Betty transient,stare.  . Sleep apnea    STOPBANG=4  . Spleen enlarged 1976, none since   r/t idiopathic thrombocytopenia  . Trouble swallowing   . Unintentional weight loss   . Weakness    weakness varies    Current Outpatient Medications:  .  albuterol (VENTOLIN HFA) 108 (90 Base) MCG/ACT inhaler, Inhale 2 puffs into the lungs every 6 (six) hours as needed for wheezing or shortness of breath., Disp: 6.7 g, Rfl: 1 .  ALPRAZolam (XANAX) 0.5 MG tablet, TAKE 1 TABLET BY MOUTH 2 TIMES Betty DAY AS NEEDED, Disp: 60 tablet, Rfl: 5 .  atenolol (TENORMIN) 50 MG tablet, TAKE 1 TABLET BY MOUTH ONCE DAILY WITH BREAKFAST, Disp: 90 tablet, Rfl: 0 .  benzonatate (TESSALON) 200 MG capsule, Take 1 capsule (200 mg total) by mouth 2 (two) times daily as needed for cough., Disp: 20 capsule, Rfl: 0 .  buPROPion (WELLBUTRIN SR) 150 MG 12 hr tablet, TAKE 1 TABLET BY MOUTH TWICE Betty DAY, Disp: 180 tablet, Rfl: 1 .  cetirizine (ZYRTEC) 10 MG tablet, TAKE 1 TABLET (10 MG TOTAL) DAILY BY MOUTH., Disp: 90 tablet, Rfl: 1 .  citalopram (CELEXA) 40  MG tablet, TAKE 1 TABLET (40 MG TOTAL) BY MOUTH DAILY WITH BREAKFAST., Disp: 90 tablet, Rfl: 0 .  diphenhydrAMINE (BENADRYL) 25 mg capsule, Take 25 mg by mouth daily as needed for allergies., Disp: , Rfl:  .  doxycycline (VIBRA-TABS) 100 MG tablet, Take 1 tablet (100 mg total) by mouth 2 (two) times daily., Disp: 20 tablet, Rfl: 0 .  fluticasone (FLONASE) 50 MCG/ACT nasal spray, SPRAY 2 SPRAYS INTO EACH NOSTRIL EVERY DAY, Disp: 48 g, Rfl: 1 .  hydrALAZINE (APRESOLINE) 25 MG tablet, Take 1 tablet (25 mg total) by mouth 3 (three) times daily as needed (blood pressure greater than 170/100)., Disp: 270 tablet, Rfl: 0 .  lisinopril (ZESTRIL) 10 MG tablet, TAKE 2 TABLETS BY MOUTH EVERY DAY, Disp: 180 tablet, Rfl: 0 .  nitroGLYCERIN (NITROSTAT) 0.4 MG SL tablet, Place 1 tablet (0.4 mg total) under the tongue every 5  (five) minutes as needed for chest pain., Disp: 25 tablet, Rfl: 3 .  Oxycodone HCl 10 MG TABS, Take 5 mg by mouth 2 (two) times daily. , Disp: , Rfl:  .  pantoprazole (PROTONIX) 40 MG tablet, TAKE 1 TABLET (40 MG TOTAL) 2 (TWO) TIMES DAILY BY MOUTH., Disp: 180 tablet, Rfl: 4 .  Probiotic Product (PROBIOTIC DAILY PO), Take by mouth., Disp: , Rfl:  .  rosuvastatin (CRESTOR) 10 MG tablet, Take 1 tablet (10 mg total) by mouth daily., Disp: 90 tablet, Rfl: 1 .  traZODone (DESYREL) 100 MG tablet, TAKE 1 TABLET BY MOUTH EVERYDAY AT BEDTIME, Disp: 90 tablet, Rfl: 0  Pulm: no dyspnea with speech but coughing during phone call.  Assessment/ Plan: 58 y.o. female   1. Cough We will empirically treat for presumed COPD exacerbation given known smoking status and suspected COPD.  I have prescribed prednisone burst, Z-Pak and since she just completed doxycycline last month, and Tessalon Perles to use as needed.  We discussed red flag signs and symptoms warranting further evaluation.  Certainly if she starts developing worsening shortness of breath that is refractory to albuterol, high fevers or severe lethargy she is to seek immediate medical attention emergency department. - predniSONE (DELTASONE) 20 MG tablet; Take 2 tablets (40 mg total) by mouth daily with breakfast for 5 days.  Dispense: 10 tablet; Refill: 0 - azithromycin (ZITHROMAX) 250 MG tablet; Take 2 tablets today, then take 1 tablet daily until gone.  Dispense: 6 tablet; Refill: 0 - benzonatate (TESSALON) 200 MG capsule; Take 1 capsule (200 mg total) by mouth 2 (two) times daily as needed for cough.  Dispense: 20 capsule; Refill: 0  2. Current smoker Advised on cessation - predniSONE (DELTASONE) 20 MG tablet; Take 2 tablets (40 mg total) by mouth daily with breakfast for 5 days.  Dispense: 10 tablet; Refill: 0 - azithromycin (ZITHROMAX) 250 MG tablet; Take 2 tablets today, then take 1 tablet daily until gone.  Dispense: 6 tablet; Refill: 0 -  benzonatate (TESSALON) 200 MG capsule; Take 1 capsule (200 mg total) by mouth 2 (two) times daily as needed for cough.  Dispense: 20 capsule; Refill: 0  3. Educated About Covid-19 Virus Infection May need to consider repeat COVID-19 testing and we discussed this during today's visit.  She would like to hold off on this.  Isolation precautions advised.   Start time: 8:55am End time: 9:02am  Total time spent on patient care (including telephone call/ virtual visit): 14 minutes  [place], [place] [phone]

## 2019-08-07 NOTE — Patient Instructions (Signed)
Chronic Obstructive Pulmonary Disease Exacerbation Chronic obstructive pulmonary disease (COPD) is a long-term (chronic) lung problem. In COPD, the flow of air from the lungs is limited. COPD exacerbations are times that breathing gets worse and you need more than your normal treatment. Without treatment, they can be life threatening. If they happen often, your lungs can become more damaged. If your COPD gets worse, your doctor may treat you with:  Medicines.  Oxygen.  Different ways to clear your airway, such as using a mask. Follow these instructions at home: Medicines  Take over-the-counter and prescription medicines only as told by your doctor.  If you take an antibiotic or steroid medicine, do not stop taking the medicine even if you start to feel better.  Keep up with shots (vaccinations) as told by your doctor. Be sure to get a yearly (annual) flu shot. Lifestyle  Do not smoke. If you need help quitting, ask your doctor.  Eat healthy foods.  Exercise regularly.  Get plenty of sleep.  Avoid tobacco smoke and other things that can bother your lungs.  Wash your hands often with soap and water. This will help keep you from getting an infection. If you cannot use soap and water, use hand sanitizer.  During flu season, avoid areas that are crowded with people. General instructions  Drink enough fluid to keep your pee (urine) clear or pale yellow. Do not do this if your doctor has told you not to.  Use a cool mist machine (vaporizer).  If you use oxygen or a machine that turns medicine into a mist (nebulizer), continue to use it as told.  Follow all instructions for rehabilitation. These are steps you can take to make your body work better.  Keep all follow-up visits as told by your doctor. This is important. Contact a doctor if:  Your COPD symptoms get worse than normal. Get help right away if:  You are short of breath and it gets worse.  You have trouble talking.   You have chest pain.  You cough up blood.  You have a fever.  You keep throwing up (vomiting).  You feel weak or you pass out (faint).  You feel confused.  You are not able to sleep because of your symptoms.  You are not able to do daily activities. Summary  COPD exacerbations are times that breathing gets worse and you need more treatment than normal.  COPD exacerbations can be very serious and may cause your lungs to become more damaged.  Do not smoke. If you need help quitting, ask your doctor.  Stay up-to-date on your shots. Get a flu shot every year. This information is not intended to replace advice given to you by your health care provider. Make sure you discuss any questions you have with your health care provider. Document Released: 10/15/2011 Document Revised: 10/08/2017 Document Reviewed: 11/30/2016 Elsevier Patient Education  2020 [place] if You Are Sick If you are sick with COVID-19 or think you might have COVID-19, follow the steps below to help protect other people in your home and community. Stay home except to get medical care.  Stay home. Most people with COVID-19 have mild illness and are able to recover at home without medical care. Do not leave your home, except to get medical care. Do not visit public areas.  Take care of yourself. Get rest and stay hydrated.  Get medical care when needed. Call your doctor before you go to their office  for care. But, if you have trouble breathing or other concerning symptoms, call 911 for immediate help.  Avoid public transportation, ride-sharing, or taxis. Separate yourself from other people and pets in your home.  As much as possible, stay in a specific room and away from other people and pets in your home. Also, you should use a separate bathroom, if available. If you need to be around other people or animals in or outside of the home, wear a cloth face covering. ? See COVID-19  and Animals if you have questions about pets: [email or web address].biz/ Monitor your symptoms.  Common symptoms of COVID-19 include fever and cough. Trouble breathing is a more serious symptom that means you should get medical attention.  Follow care instructions from your healthcare provider and local health department. Your local health authorities will give instructions on checking your symptoms and reporting information. If you develop emergency warning signs for COVID-19 get medical attention immediately.  Emergency warning signs include*:  Trouble breathing  Persistent pain or pressure in the chest  New confusion or not able to be woken  Bluish lips or face *This list is not all inclusive. Please consult your medical provider for any other symptoms that are severe or concerning to you. Call 911 if you have a medical emergency. If you have a medical emergency and need to call 911, notify the operator that you have or think you might have, COVID-19. If possible, put on a facemask before medical help arrives. Call ahead before visiting your doctor.  Call ahead. Many medical visits for routine care are being postponed or done by phone or telemedicine.  If you have a medical appointment that cannot be postponed, call your doctor's office. This will help the office protect themselves and other patients. If you are sick, wear a cloth covering over your nose and mouth.  You should wear a cloth face covering over your nose and mouth if you must be around other people or animals, including pets (even at home).  You don't need to wear the cloth face covering if you are alone. If you can't put on a cloth face covering (because of trouble breathing for example), cover your coughs and sneezes in some other way. Try to stay at least 6 feet away from other people. This will help protect the people around you. Note: During the COVID-19 pandemic, medical grade  facemasks are reserved for healthcare workers and some first responders. You may need to make a cloth face covering using a scarf or bandana. Cover your coughs and sneezes.  Cover your mouth and nose with a tissue when you cough or sneeze.  Throw used tissues in a lined trash can.  Immediately wash your hands with soap and water for at least 20 seconds. If soap and water are not available, clean your hands with an alcohol-based hand sanitizer that contains at least 60% alcohol. Clean your hands often.  Wash your hands often with soap and water for at least 20 seconds. This is especially important after blowing your nose, coughing, or sneezing; going to the bathroom; and before eating or preparing food.  Use hand sanitizer if soap and water are not available. Use an alcohol-based hand sanitizer with at least 60% alcohol, covering all surfaces of your hands and rubbing them together until they feel dry.  Soap and water are the best option, especially if your hands are visibly dirty.  Avoid touching your eyes, nose, and mouth with unwashed hands. Avoid sharing personal household items.  Do not share dishes, drinking glasses, cups, eating utensils, towels, or bedding with other people in your home.  Wash these items thoroughly after using them with soap and water or put them in the dishwasher. Clean all "high-touch" surfaces everyday.  Clean and disinfect high-touch surfaces in your "sick room" and bathroom. Let someone else clean and disinfect surfaces in common areas, but not your bedroom and bathroom.  If a caregiver or other person needs to clean and disinfect a sick person's bedroom or bathroom, they should do so on an as-needed basis. The caregiver/other person should wear a mask and wait as long as possible after the sick person has used the bathroom. High-touch surfaces include phones, remote controls, counters, tabletops, doorknobs, bathroom fixtures, toilets, keyboards, tablets, and  bedside tables.  Clean and disinfect areas that may have blood, stool, or body fluids on them.  Use household cleaners and disinfectants. Clean the area or item with soap and water or another detergent if it is dirty. Then use a household disinfectant. ? Be sure to follow the instructions on the label to ensure safe and effective use of the product. Many products recommend keeping the surface wet for several minutes to ensure germs are killed. Many also recommend precautions such as wearing gloves and making sure you have good ventilation during use of the product. ? Most EPA-registered household disinfectants should be effective. How to discontinue home isolation  People with COVID-19 who have stayed home (home isolated) can stop home isolation under the following conditions: ? If you will not have a test to determine if you are still contagious, you can leave home after these three things have happened:  You have had no fever for at least 72 hours (that is three full days of no fever without the use of medicine that reduces fevers) AND  other symptoms have improved (for example, when your cough or shortness of breath has improved) AND  at least 10 days have passed since your symptoms first appeared. ? If you will be tested to determine if you are still contagious, you can leave home after these three things have happened:  You no longer have a fever (without the use of medicine that reduces fevers) AND  other symptoms have improved (for example, when your cough or shortness of breath has improved) AND  you received two negative tests in a row, 24 hours apart. Your doctor will follow CDC guidelines. In all cases, follow the guidance of your healthcare provider and local health department. The decision to stop home isolation should be made in consultation with your healthcare provider and state and local health departments. Local decisions depend on local circumstances. [email or web address]  03/12/2019 This information is not intended to replace advice given to you by your health care provider. Make sure you discuss any questions you have with your health care provider. Document Released: 02/21/2019 Document Revised: 03/22/2019 Document Reviewed: 02/21/2019 Elsevier Patient Education  [place].

## 2019-08-09 ENCOUNTER — Encounter: Payer: Medicare PPO | Admitting: Family Medicine

## 2019-08-11 ENCOUNTER — Other Ambulatory Visit: Payer: Self-pay | Admitting: Family Medicine

## 2019-08-11 ENCOUNTER — Telehealth: Payer: Self-pay | Admitting: Family Medicine

## 2019-08-11 DIAGNOSIS — R059 Cough, unspecified: Secondary | ICD-10-CM

## 2019-08-11 DIAGNOSIS — R05 Cough: Secondary | ICD-10-CM

## 2019-08-11 NOTE — Telephone Encounter (Signed)
What symptoms do you have? Real deep cough and front and back of chest hurts and not any better. No fever  How long have you been sick? 2 weeks  Have you been seen for this problem? Had televisit with Lajuana Ripple  If your provider decides to give you a prescription, which pharmacy would you like for it to be sent to? [facility]   Patient informed that this information will be sent to the clinical staff for review and that they should receive a follow up call.

## 2019-08-11 NOTE — Telephone Encounter (Signed)
Patient aware and verbalizes understanding. 

## 2019-08-11 NOTE — Telephone Encounter (Signed)
Patient had a phone visit with Dr. Darnell Level 9/28 and was given azithromycin.  States that her fever is gone but other than that she is no better. Please advise and send back to pools.

## 2019-08-11 NOTE — Telephone Encounter (Signed)
Recommend CXR.  I have ordered this.  Can be obtained at [facility].  May need to consider repeat COVID test

## 2019-08-14 ENCOUNTER — Other Ambulatory Visit: Payer: Self-pay

## 2019-08-14 ENCOUNTER — Ambulatory Visit (HOSPITAL_COMMUNITY)
Admission: RE | Admit: 2019-08-14 | Discharge: 2019-08-14 | Disposition: A | Discharge status: Home / Self Care | Payer: Medicare PPO | Source: Ambulatory Visit | Attending: Family Medicine | Admitting: Family Medicine

## 2019-08-14 DIAGNOSIS — R05 Cough: Secondary | ICD-10-CM | POA: Diagnosis not present

## 2019-08-14 DIAGNOSIS — R059 Cough, unspecified: Secondary | ICD-10-CM

## 2019-08-16 ENCOUNTER — Other Ambulatory Visit: Payer: Self-pay | Admitting: Family

## 2019-08-16 DIAGNOSIS — F411 Generalized anxiety disorder: Secondary | ICD-10-CM

## 2019-08-16 DIAGNOSIS — F331 Major depressive disorder, recurrent, moderate: Secondary | ICD-10-CM

## 2019-08-17 ENCOUNTER — Other Ambulatory Visit: Payer: Self-pay | Admitting: Nurse Practitioner

## 2019-08-17 DIAGNOSIS — J320 Chronic maxillary sinusitis: Secondary | ICD-10-CM

## 2019-08-17 DIAGNOSIS — J410 Simple chronic bronchitis: Secondary | ICD-10-CM

## 2019-08-18 ENCOUNTER — Other Ambulatory Visit: Payer: Self-pay | Admitting: Family Medicine

## 2019-08-18 DIAGNOSIS — R05 Cough: Secondary | ICD-10-CM

## 2019-08-18 DIAGNOSIS — F17200 Nicotine dependence, unspecified, uncomplicated: Secondary | ICD-10-CM

## 2019-08-18 DIAGNOSIS — R059 Cough, unspecified: Secondary | ICD-10-CM

## 2019-08-22 ENCOUNTER — Other Ambulatory Visit: Payer: Self-pay

## 2019-08-22 DIAGNOSIS — Z20822 Contact with and (suspected) exposure to covid-19: Secondary | ICD-10-CM

## 2019-08-24 ENCOUNTER — Encounter: Payer: Self-pay | Admitting: Family

## 2019-08-24 ENCOUNTER — Ambulatory Visit (INDEPENDENT_AMBULATORY_CARE_PROVIDER_SITE_OTHER): Payer: Medicare PPO | Admitting: Family

## 2019-08-24 DIAGNOSIS — F17200 Nicotine dependence, unspecified, uncomplicated: Secondary | ICD-10-CM

## 2019-08-24 DIAGNOSIS — M545 Low back pain, unspecified: Secondary | ICD-10-CM

## 2019-08-24 DIAGNOSIS — E669 Obesity, unspecified: Secondary | ICD-10-CM

## 2019-08-24 DIAGNOSIS — E785 Hyperlipidemia, unspecified: Secondary | ICD-10-CM

## 2019-08-24 DIAGNOSIS — I10 Essential (primary) hypertension: Secondary | ICD-10-CM

## 2019-08-24 DIAGNOSIS — G8929 Other chronic pain: Secondary | ICD-10-CM

## 2019-08-24 DIAGNOSIS — G4700 Insomnia, unspecified: Secondary | ICD-10-CM

## 2019-08-24 DIAGNOSIS — F1320 Sedative, hypnotic or anxiolytic dependence, uncomplicated: Secondary | ICD-10-CM

## 2019-08-24 DIAGNOSIS — F331 Major depressive disorder, recurrent, moderate: Secondary | ICD-10-CM

## 2019-08-24 DIAGNOSIS — F411 Generalized anxiety disorder: Secondary | ICD-10-CM

## 2019-08-24 DIAGNOSIS — K219 Gastro-esophageal reflux disease without esophagitis: Secondary | ICD-10-CM | POA: Diagnosis not present

## 2019-08-24 DIAGNOSIS — Z79899 Other long term (current) drug therapy: Secondary | ICD-10-CM

## 2019-08-24 LAB — NOVEL CORONAVIRUS, NAA: SARS-CoV-2, NAA: NOT DETECTED

## 2019-08-24 MED ORDER — ALPRAZOLAM 0.5 MG PO TABS: ORAL_TABLET | ORAL | 5 refills | Status: DC

## 2019-08-24 MED ORDER — HYDRALAZINE HCL 25 MG PO TABS: 25.0000 mg | ORAL_TABLET | Freq: Three times a day (TID) | ORAL | 0 refills | Status: DC | PRN

## 2019-08-24 MED ORDER — ATENOLOL 50 MG PO TABS: ORAL_TABLET | ORAL | 0 refills | Status: DC

## 2019-08-24 MED ORDER — TRAZODONE HCL 100 MG PO TABS: ORAL_TABLET | ORAL | 2 refills | Status: DC

## 2019-08-24 MED ORDER — CITALOPRAM HYDROBROMIDE 40 MG PO TABS: 40.0000 mg | ORAL_TABLET | Freq: Every day | ORAL | 0 refills | Status: DC

## 2019-08-24 MED ORDER — ROSUVASTATIN CALCIUM 10 MG PO TABS: 10.0000 mg | ORAL_TABLET | Freq: Every day | ORAL | 1 refills | Status: DC

## 2019-08-24 MED ORDER — LISINOPRIL 10 MG PO TABS: 20.0000 mg | ORAL_TABLET | Freq: Every day | ORAL | 2 refills | Status: DC

## 2019-08-24 NOTE — Progress Notes (Signed)
Virtual Visit via telephone Note Due to COVID-19 pandemic this visit was conducted virtually. This visit type was conducted due to national recommendations for restrictions regarding the COVID-19 Pandemic (e.g. social distancing, sheltering in place) in an effort to limit this patient's exposure and mitigate transmission in our community. All issues noted in this document were discussed and addressed.  A physical exam was not performed with this format.  I connected with [name] on 08/24/19 at 10:58 AM by telephone and verified that I am speaking with the correct person using two identifiers. [name] is currently located at home and no one is currently with her during visit. The provider, Evelina Dun, FNP is located in their office at time of visit.  I discussed the limitations, risks, security and privacy concerns of performing an evaluation and management service by telephone and the availability of in person appointments. I also discussed with the patient that there may be a patient responsible charge related to this service. The patient expressed understanding and agreed to proceed.   History and Present Illness:  PT presents to the office today for chronic follow up. PT has surgery on L4-S1 on 03/22/17, she is followed by her Neurosurgeon every 1-2 months.Pt is followed by Cardiologists every 6 months. Hypertension This is a chronic problem. The current episode started more than 1 year ago. The problem has been resolved since onset. The problem is controlled. Associated symptoms include anxiety and malaise/fatigue. Pertinent negatives include no headaches, peripheral edema or shortness of breath. Risk factors for coronary artery disease include dyslipidemia, obesity and sedentary lifestyle. The current treatment provides moderate improvement. Hypertensive end-organ damage includes heart failure. There is no history of CAD/MI.  Hyperlipidemia This is a chronic problem.  The current episode started more than 1 year ago. The problem is uncontrolled. Recent lipid tests were reviewed and are high. Exacerbating diseases include obesity. Factors aggravating her hyperlipidemia include smoking. Pertinent negatives include no shortness of breath. Current antihyperlipidemic treatment includes statins. The current treatment provides moderate improvement of lipids. Risk factors for coronary artery disease include dyslipidemia, hypertension and a sedentary lifestyle.  Gastroesophageal Reflux She complains of heartburn. She reports no belching or no coughing. This is a chronic problem. The current episode started more than 1 year ago. The problem occurs occasionally. Risk factors include obesity. She has tried a PPI for the symptoms. The treatment provided moderate relief.  Anxiety Symptoms include decreased concentration, depressed mood, excessive worry, irritability, nervous/anxious behavior and restlessness. Patient reports no shortness of breath. The severity of symptoms is moderate. The quality of sleep is good.    Depression        This is a chronic problem.  The current episode started more than 1 year ago.   The onset quality is gradual.   The problem occurs intermittently.  The problem has been waxing and waning since onset.  Associated symptoms include decreased concentration, irritable, restlessness and sad.  Associated symptoms include no hopelessness and no headaches.  Past treatments include SSRIs - Selective serotonin reuptake inhibitors.  Past medical history includes anxiety.       Review of Systems  Constitutional: Positive for irritability and malaise/fatigue.  Respiratory: Negative for cough and shortness of breath.   Gastrointestinal: Positive for heartburn.  Neurological: Negative for headaches.  Psychiatric/Behavioral: Positive for decreased concentration and depression. The patient is nervous/anxious.   All other systems reviewed and are negative.     Observations/Objective: No SOB or distress noted  Assessment and Plan: 1. Essential hypertension - lisinopril (ZESTRIL) 10 MG tablet; Take 2 tablets (20 mg total) by mouth daily.  Dispense: 180 tablet; Refill: 2  2. Gastroesophageal reflux disease, unspecified whether esophagitis present  3. Obesity (BMI 30-39.9)  4. Chronic bilateral low back pain, unspecified whether sciatica present  5. Hyperlipidemia, unspecified hyperlipidemia type  6. GAD (generalized anxiety disorder) - citalopram (CELEXA) 40 MG tablet; Take 1 tablet (40 mg total) by mouth daily with breakfast. Needs to be seen  Dispense: 90 tablet; Refill: 0 - ALPRAZolam (XANAX) 0.5 MG tablet; TAKE 1 TABLET BY MOUTH 2 TIMES A DAY AS NEEDED  Dispense: 60 tablet; Refill: 5  7. Moderate episode of recurrent major depressive disorder (HCC) - citalopram (CELEXA) 40 MG tablet; Take 1 tablet (40 mg total) by mouth daily with breakfast. Needs to be seen  Dispense: 90 tablet; Refill: 0  8. Current smoker  9. Controlled substance agreement signed - ALPRAZolam (XANAX) 0.5 MG tablet; TAKE 1 TABLET BY MOUTH 2 TIMES A DAY AS NEEDED  Dispense: 60 tablet; Refill: 5  10. Benzodiazepine dependence (HCC)  - ALPRAZolam (XANAX) 0.5 MG tablet; TAKE 1 TABLET BY MOUTH 2 TIMES A DAY AS NEEDED  Dispense: 60 tablet; Refill: 5  11. Insomnia, unspecified type - traZODone (DESYREL) 100 MG tablet; TAKE 1 TABLET BY MOUTH EVERYDAY AT BEDTIME  Dispense: 90 tablet; Refill: 2  Pt reviewed in [place] controlled database- No red flags noted Will refill medications Labs reviewed, will do on her next visit  Health Maintenance discussed    I discussed the assessment and treatment plan with the patient. The patient was provided an opportunity to ask questions and all were answered. The patient agreed with the plan and demonstrated an understanding of the instructions.   The patient was advised to call back or seek an in-person evaluation if the symptoms worsen  or if the condition fails to improve as anticipated.  The above assessment and management plan was discussed with the patient. The patient verbalized understanding of and has agreed to the management plan. Patient is aware to call the clinic if symptoms persist or worsen. Patient is aware when to return to the clinic for a follow-up visit. Patient educated on when it is appropriate to go to the emergency department.   Time call ended:  11:17 AM  I provided 19 minutes of non-face-to-face time during this encounter.    Evelina Dun, FNP

## 2019-09-08 ENCOUNTER — Ambulatory Visit (INDEPENDENT_AMBULATORY_CARE_PROVIDER_SITE_OTHER): Payer: Medicare PPO | Admitting: *Deleted

## 2019-09-08 DIAGNOSIS — Z0000 Encounter for general adult medical examination without abnormal findings: Secondary | ICD-10-CM | POA: Diagnosis not present

## 2019-09-08 NOTE — Progress Notes (Addendum)
MEDICARE ANNUAL WELLNESS VISIT  09/08/2019  Telephone Visit Disclaimer This Medicare AWV was conducted by telephone due to national recommendations for restrictions regarding the COVID-19 Pandemic (e.g. social distancing).  I verified, using two identifiers, that I am speaking with [name] or their authorized healthcare agent. I discussed the limitations, risks, security, and privacy concerns of performing an evaluation and management service by telephone and the potential availability of an in-person appointment in the future. The patient expressed understanding and agreed to proceed.   Subjective:  JARA MOHLER is a 58 y.o. female patient of Hawks, Theador Hawthorne, FNP who had a Medicare Annual Wellness Visit today via telephone. Angelina is Retired and lives with their spouse. she has 3 children. she reports that she is socially active and does interact with friends/family regularly. she is moderately physically active and enjoys playing with her "fur babies" and walking outside.  Patient Care Team: Sharion Balloon, FNP as PCP - General (Family Medicine) Starling Manns, MD (Orthopedic Surgery) Arnoldo Lenis, MD as Consulting Physician (Cardiology)  Advanced Directives 09/08/2019 09/06/2018 08/28/2017 06/30/2017 06/30/2017 07/14/2016 07/09/2016  Does Patient Have a Medical Advance Directive? No No No No No No No  Would patient like information on creating a medical advance directive? No - Patient declined Yes (MAU/Ambulatory/Procedural Areas - Information given) - No - Patient declined - No - patient declined information No - patient declined information  Pre-existing out of facility DNR order (yellow form or pink MOST form) - - - - - - -    Hospital Utilization Over the Past 12 Months: # of hospitalizations or ER visits: 0 # of surgeries: 0  Review of Systems    Patient reports that her overall health is unchanged compared to last year.  History obtained from chart review   Patient Reported Readings (BP, Pulse, CBG, Weight, etc) none  Pain Assessment Pain : No/denies pain     Current Medications & Allergies (verified) Allergies as of 09/08/2019      Reactions   Amoxicillin Diarrhea   Nsaids Nausea Only   Abdominal pain      Medication List       Accurate as of September 08, 2019  3:03 PM. If you have any questions, ask your nurse or doctor.        STOP taking these medications   cetirizine 10 MG tablet Commonly known as: ZYRTEC   hydrALAZINE 25 MG tablet Commonly known as: APRESOLINE   PROBIOTIC DAILY PO     TAKE these medications   albuterol 108 (90 Base) MCG/ACT inhaler Commonly known as: VENTOLIN HFA Inhale 2 puffs into the lungs every 6 (six) hours as needed for wheezing or shortness of breath.   ALPRAZolam 0.5 MG tablet Commonly known as: XANAX TAKE 1 TABLET BY MOUTH 2 TIMES A DAY AS NEEDED   amitriptyline 25 MG tablet Commonly known as: ELAVIL TAKE 1 TO 2 TABLETS BY MOUTH EVERY DAY AT BEDTIME   atenolol 50 MG tablet Commonly known as: TENORMIN TAKE 1 TABLET BY MOUTH ONCE DAILY WITH BREAKFAST Needs to be seen   citalopram 40 MG tablet Commonly known as: CELEXA Take 1 tablet (40 mg total) by mouth daily with breakfast. Needs to be seen   diphenhydrAMINE 25 mg capsule Commonly known as: BENADRYL Take 25 mg by mouth daily as needed for allergies.   fluticasone 50 MCG/ACT nasal spray Commonly known as: FLONASE SPRAY 2 SPRAYS INTO EACH NOSTRIL EVERY DAY  lisinopril 10 MG tablet Commonly known as: ZESTRIL Take 2 tablets (20 mg total) by mouth daily.   nitroGLYCERIN 0.4 MG SL tablet Commonly known as: NITROSTAT Place 1 tablet (0.4 mg total) under the tongue every 5 (five) minutes as needed for chest pain.   ondansetron 4 MG tablet Commonly known as: ZOFRAN Take 4 mg by mouth every 8 (eight) hours as needed. for nausea   Oxycodone HCl 10 MG Tabs Take 5 mg by mouth 2 (two) times daily.   pantoprazole 40 MG  tablet Commonly known as: PROTONIX TAKE 1 TABLET (40 MG TOTAL) 2 (TWO) TIMES DAILY BY MOUTH.   rosuvastatin 10 MG tablet Commonly known as: Crestor Take 1 tablet (10 mg total) by mouth daily.   tiZANidine 4 MG tablet Commonly known as: ZANAFLEX   traZODone 100 MG tablet Commonly known as: DESYREL TAKE 1 TABLET BY MOUTH EVERYDAY AT BEDTIME       History (reviewed): Past Medical History:  Diagnosis Date  . Abdominal pain   . Anxiety   . Arthritis   . Blood in stool   . Breast changes, fibrocystic   . Bruises easily   . Cardiomegaly 2018  . Clotting disorder (HCC)     itp , none since 1976, no current hematologist  . Constipation   . DDD (degenerative disc disease), cervical    with lumbar issues  . Depression   . Generalized headaches   . GERD (gastroesophageal reflux disease)   . Hemorrhoid   . Hyperlipidemia   . Hypertension   . Idiopathic thrombocytopenia ([place])    as child  . Leg swelling    both ankles  . Liver lesion   . Nasal congestion   . Nausea & vomiting   . Neuromuscular disorder ([place])    back  injury  . Neuromuscular disorder ([place])    3 neck surgeries,neck fusion.pin,plates,screws  . Osteoporosis   . Panic attacks    pt also  has germaphobia  . Rectal bleeding   . Seizures ([place]) 1998   stress induced, one time, none since, no seizure meds  . Seizures ([place])    Stated she had 2 weeks ago,it was like a transient,stare.  . Sleep apnea    STOPBANG=4  . Spleen enlarged 1976, none since   r/t idiopathic thrombocytopenia  . Trouble swallowing   . Unintentional weight loss   . Weakness    weakness varies   Past Surgical History:  Procedure Laterality Date  . ABDOMINAL HYSTERECTOMY  1997  . BACK SURGERY    . CERVICAL SPINE SURGERY  2005, 2007, 2010   x3, fusion done,plates and screws present  . [place]  . CHOLECYSTECTOMY  03/14/2012   Procedure: LAPAROSCOPIC CHOLECYSTECTOMY;  Surgeon: Stark Klein, MD;  Location: [facility];   Service: General;  Laterality: N/A;  . colonscopy and esophagogastrodudononescopy  02-04-12  . LUMBAR DISC SURGERY  11/2014   Family History  Problem Relation Age of Onset  . Hypertension Mother   . Heart disease Mother   . Lung cancer Father   . Cancer Father        lung  . Colon cancer Paternal Grandmother   . Cancer Paternal Grandmother        colon  . Heart disease Maternal Grandmother   . Diabetes Sister   . Bipolar disorder Sister   . Heart disease Sister        Congestive Heart Failure  . Hypertension Sister  Social History   Socioeconomic History  . Marital status: Married    Spouse name: Legrand Como  . Number of children: 3  . Years of education: 62  . Highest education level: Associate degree: academic program  Occupational History  . Occupation: Corporate investment banker: UNEMPLOYED  Social Needs  . Financial resource strain: Hard  . Food insecurity    Worry: Sometimes true    Inability: Never true  . Transportation needs    Medical: No    Non-medical: No  Tobacco Use  . Smoking status: Current Every Day Smoker    Packs/day: 1.00    Years: 12.00    Pack years: 12.00    Types: Cigarettes    Start date: 12/20/2005  . Smokeless tobacco: Never Used  Substance and Sexual Activity  . Alcohol use: No    Alcohol/week: 0.0 standard drinks  . Drug use: No  . Sexual activity: Yes  Lifestyle  . Physical activity    Days per week: 7 days    Minutes per session: 60 min  . Stress: Not at all  Relationships  . Social connections    Talks on phone: More than three times a week    Gets together: More than three times a week    Attends religious service: More than 4 times per year    Active member of club or organization: Yes    Attends meetings of clubs or organizations: More than 4 times per year    Relationship status: Married  Other Topics Concern  . Not on file  Social History Narrative   She lives with her boyfriend.  She used to smoke, quit May 2013, she is  not drinking. She has 3 children    Activities of Daily Living In your present state of health, do you have any difficulty performing the following activities: 09/08/2019  Hearing? Y  Comment noticed over the past 6 months that her hearing isn't as good as it used to be  Vision? N  Comment wears otc reading glasses-gets yearly eye exam  Difficulty concentrating or making decisions? N  Walking or climbing stairs? N  Dressing or bathing? N  Doing errands, shopping? N  Preparing Food and eating ? N  Using the Toilet? N  In the past six months, have you accidently leaked urine? N  Do you have problems with loss of bowel control? N  Managing your Medications? N  Managing your Finances? N  Housekeeping or managing your Housekeeping? N  Some recent data might be hidden    Patient Education/ Literacy How often do you need to have someone help you when you read instructions, pamphlets, or other written materials from your doctor or pharmacy?: 1 - Never What is the last grade level you completed in school?: Associates Degree  Exercise Current Exercise Habits: Home exercise routine, Type of exercise: walking, Time (Minutes): 60, Frequency (Times/Week): 7, Weekly Exercise (Minutes/Week): 420, Intensity: Mild, Exercise limited by: orthopedic condition(s)  Diet Patient reports consuming 2 meals a day and 1 snack(s) a day Patient reports that her primary diet is: Regular Patient reports that she does have regular access to food.   Depression Screen PHQ 2/9 Scores 09/08/2019 02/10/2019 09/06/2018 08/08/2018 02/04/2018 09/21/2017 08/26/2017  PHQ - 2 Score 0 1 1 1  0 2 2  PHQ- 9 Score - 6 - - - 7 7     Fall Risk Fall Risk  09/08/2019 09/06/2018 08/26/2017 03/09/2017 03/05/2017  Falls in the past year? 0  No No No No  Number falls in past yr: 0 - - - -  Injury with Fall? 0 - - - -     Objective:  Shelly Coss Camille seemed alert and oriented and she participated appropriately during our telephone  visit.  Blood Pressure Weight BMI  BP Readings from Last 3 Encounters:  02/10/19 (!) 149/94  09/06/18 126/78  08/08/18 134/78   Wt Readings from Last 3 Encounters:  02/10/19 196 lb (88.9 kg)  09/06/18 189 lb (85.7 kg)  08/08/18 185 lb (83.9 kg)   BMI Readings from Last 1 Encounters:  02/10/19 33.64 kg/m    *Unable to obtain current vital signs, weight, and BMI due to telephone visit type  Hearing/Vision  . [place] did not seem to have difficulty with hearing/understanding during the telephone conversation . Reports that she has had a formal eye exam by an eye care professional within the past year . Reports that she has not had a formal hearing evaluation within the past year *Unable to fully assess hearing and vision during telephone visit type  Cognitive Function: 6CIT Screen 09/08/2019  What Year? 0 points  What month? 0 points  What time? 0 points  Count back from 20 0 points  Months in reverse 0 points  Repeat phrase 2 points  Total Score 2   (Normal:0-7, Significant for Dysfunction: >8)  Normal Cognitive Function Screening: Yes   Immunization & Health Maintenance Record Immunization History  Administered Date(s) Administered  . Influenza,inj,Quad PF,6+ Mos 10/06/2016, 08/05/2017, 08/08/2018  . Pneumococcal Conjugate-13 10/06/2016  . Tdap 02/04/2018  . Zoster Recombinat (Shingrix) 09/06/2018    Health Maintenance  Topic Date Due  . PAP SMEAR-Modifier  07/03/2019  . INFLUENZA VACCINE  03/29/2020 (Originally 06/10/2019)  . MAMMOGRAM  11/04/2020  . COLONOSCOPY  02/03/2022  . TETANUS/TDAP  02/05/2028  . Hepatitis C Screening  Completed  . HIV Screening  Completed       Assessment  This is a routine wellness examination for [employer].  Health Maintenance: Due or Overdue Health Maintenance Due  Topic Date Due  . PAP SMEAR-Modifier  07/03/2019    [name] does not need a referral for Community Assistance: Care Management:   no Social  Work:    no Prescription Assistance:  no Nutrition/Diabetes Education:  no   Plan:  Personalized Goals Goals Addressed            This Visit's Progress   . DIET - INCREASE WATER INTAKE       Try to drink 6-8 glasses of water daily      Personalized Health Maintenance & Screening Recommendations  Influenza vaccine  Lung Cancer Screening Recommended: yes-declines at this time (Low Dose CT Chest recommended if Age 43-80 years, 30 pack-year currently smoking OR have quit w/in past 15 years) Hepatitis C Screening recommended: no HIV Screening recommended: no  Advanced Directives: Written information was not prepared per patient's request.  Referrals & Orders No orders of the defined types were placed in this encounter.   Follow-up Plan . Follow-up with Sharion Balloon, FNP as planned . Keep your appointment with the Triage Nurse for your Flu vaccine   I have personally reviewed and noted the following in the patient's chart:   . Medical and social history . Use of alcohol, tobacco or illicit drugs  . Current medications and supplements . Functional ability and status . Nutritional status . Physical activity . Advanced directives . List of other physicians .  Hospitalizations, surgeries, and ER visits in previous 12 months . Vitals . Screenings to include cognitive, depression, and falls . Referrals and appointments  In addition, I have reviewed and discussed with [name] certain preventive protocols, quality metrics, and best practice recommendations. A written personalized care plan for preventive services as well as general preventive health recommendations is available and can be mailed to the patient at her request.      Salahuddin Arismendez, Donny Pique, LPN  [phone]    I have reviewed and agree with the above AWV documentation.   Evelina Dun, FNP

## 2019-09-08 NOTE — Patient Instructions (Signed)

## 2019-09-21 ENCOUNTER — Ambulatory Visit: Payer: Medicare PPO

## 2019-10-27 ENCOUNTER — Other Ambulatory Visit: Payer: Self-pay | Admitting: Family

## 2019-10-27 DIAGNOSIS — J410 Simple chronic bronchitis: Secondary | ICD-10-CM

## 2019-10-27 DIAGNOSIS — J320 Chronic maxillary sinusitis: Secondary | ICD-10-CM

## 2019-11-10 DIAGNOSIS — D649 Anemia, unspecified: Secondary | ICD-10-CM

## 2019-11-10 HISTORY — DX: Anemia, unspecified: D64.9

## 2019-12-22 ENCOUNTER — Other Ambulatory Visit: Payer: Self-pay | Admitting: Family

## 2019-12-22 DIAGNOSIS — F331 Major depressive disorder, recurrent, moderate: Secondary | ICD-10-CM

## 2019-12-22 DIAGNOSIS — F411 Generalized anxiety disorder: Secondary | ICD-10-CM

## 2019-12-25 NOTE — Telephone Encounter (Signed)
OV 10.15.20 rtc 6 mos

## 2020-01-06 ENCOUNTER — Other Ambulatory Visit: Payer: Self-pay | Admitting: Physician Assistant

## 2020-01-18 ENCOUNTER — Other Ambulatory Visit: Payer: Self-pay | Admitting: Family

## 2020-01-29 ENCOUNTER — Other Ambulatory Visit: Payer: Self-pay

## 2020-01-29 ENCOUNTER — Ambulatory Visit: Payer: Medicare PPO | Admitting: Family

## 2020-01-29 ENCOUNTER — Encounter: Payer: Self-pay | Admitting: Family

## 2020-01-29 VITALS — BP 159/93 | HR 77 | Temp 98.2°F | Ht 64.0 in | Wt 163.8 lb

## 2020-01-29 DIAGNOSIS — F411 Generalized anxiety disorder: Secondary | ICD-10-CM

## 2020-01-29 DIAGNOSIS — F1320 Sedative, hypnotic or anxiolytic dependence, uncomplicated: Secondary | ICD-10-CM | POA: Diagnosis not present

## 2020-01-29 DIAGNOSIS — F331 Major depressive disorder, recurrent, moderate: Secondary | ICD-10-CM

## 2020-01-29 DIAGNOSIS — Z79899 Other long term (current) drug therapy: Secondary | ICD-10-CM

## 2020-01-29 DIAGNOSIS — E785 Hyperlipidemia, unspecified: Secondary | ICD-10-CM

## 2020-01-29 DIAGNOSIS — I10 Essential (primary) hypertension: Secondary | ICD-10-CM

## 2020-01-29 DIAGNOSIS — K219 Gastro-esophageal reflux disease without esophagitis: Secondary | ICD-10-CM

## 2020-01-29 DIAGNOSIS — E669 Obesity, unspecified: Secondary | ICD-10-CM

## 2020-01-29 DIAGNOSIS — M545 Low back pain: Secondary | ICD-10-CM

## 2020-01-29 DIAGNOSIS — G4700 Insomnia, unspecified: Secondary | ICD-10-CM

## 2020-01-29 DIAGNOSIS — G8929 Other chronic pain: Secondary | ICD-10-CM

## 2020-01-29 MED ORDER — ALPRAZOLAM 0.5 MG PO TABS: ORAL_TABLET | ORAL | 5 refills | Status: DC

## 2020-01-29 MED ORDER — LISINOPRIL 20 MG PO TABS: 20.0000 mg | ORAL_TABLET | Freq: Every day | ORAL | 3 refills | Status: DC

## 2020-01-29 MED ORDER — ATENOLOL 50 MG PO TABS: ORAL_TABLET | ORAL | 0 refills | Status: DC

## 2020-01-29 MED ORDER — CITALOPRAM HYDROBROMIDE 40 MG PO TABS: 40.0000 mg | ORAL_TABLET | Freq: Every day | ORAL | 0 refills | Status: DC

## 2020-01-29 MED ORDER — TRAZODONE HCL 100 MG PO TABS: ORAL_TABLET | ORAL | 2 refills | Status: DC

## 2020-01-29 NOTE — Progress Notes (Signed)
Subjective:    Patient ID: [name], female    DOB: May 03, 1961, 59 y.o.   MRN: [id number]  Chief Complaint  Patient presents with  . Medical Management of Chronic Issues   PT presents to the office today for chronic follow up. PT has surgery on L4-S1 on 03/22/17, she is followed by her Neurosurgeon every 1-2 months.Pt is followed by Cardiologists as needed.  Hypertension This is a chronic problem. The current episode started more than 1 year ago. The problem has been waxing and waning since onset. The problem is uncontrolled. Associated symptoms include anxiety. Pertinent negatives include no malaise/fatigue, peripheral edema or shortness of breath. Risk factors for coronary artery disease include dyslipidemia, obesity and sedentary lifestyle. The current treatment provides moderate improvement. There is no history of kidney disease or heart failure. There is no history of pheochromocytoma.  Gastroesophageal Reflux She complains of belching, heartburn and a hoarse voice. This is a chronic problem. The current episode started more than 1 year ago. The problem occurs occasionally. The problem has been waxing and waning. The symptoms are aggravated by certain foods. Risk factors include smoking/tobacco exposure and obesity. She has tried a PPI for the symptoms. The treatment provided moderate relief.  Back Pain This is a chronic problem. The current episode started more than 1 year ago. The problem occurs intermittently. The problem has been waxing and waning since onset. The pain is present in the lumbar spine. The quality of the pain is described as aching. The pain is at a severity of 4/10. The pain is moderate. She has tried analgesics and bed rest for the symptoms. The treatment provided moderate relief.  Hyperlipidemia This is a chronic problem. The current episode started more than 1 year ago. The problem is uncontrolled. Recent lipid tests were reviewed and are high. Pertinent  negatives include no shortness of breath. Current antihyperlipidemic treatment includes diet change. The current treatment provides mild improvement of lipids. Risk factors for coronary artery disease include dyslipidemia, diabetes mellitus, hypertension and a sedentary lifestyle.  Anxiety Presents for follow-up visit. Symptoms include depressed mood, excessive worry, irritability, nervous/anxious behavior and restlessness. Patient reports no shortness of breath. Symptoms occur most days. The severity of symptoms is moderate. The quality of sleep is good.    Depression        This is a chronic problem.  The current episode started more than 1 year ago.   The onset quality is gradual.   The problem occurs intermittently.  Associated symptoms include irritable and restlessness.  Associated symptoms include no helplessness and no hopelessness.     The symptoms are aggravated by family issues.  Past medical history includes anxiety.   Nicotine Dependence Presents for follow-up visit. Symptoms include irritability. Her urge triggers include company of smokers. The symptoms have been stable. She smokes 1 pack of cigarettes per day.      Review of Systems  Constitutional: Positive for irritability. Negative for malaise/fatigue.  HENT: Positive for hoarse voice.   Respiratory: Negative for shortness of breath.   Gastrointestinal: Positive for heartburn.  Musculoskeletal: Positive for back pain.  Psychiatric/Behavioral: Positive for depression. The patient is nervous/anxious.   All other systems reviewed and are negative.      Objective:   Physical Exam Vitals reviewed.  Constitutional:      General: She is irritable. She is not in acute distress.    Appearance: She is well-developed.  HENT:     Head: Normocephalic and atraumatic.  Comments: Hoarse voice    Right Ear: Tympanic membrane normal.     Left Ear: Tympanic membrane normal.  Eyes:     Pupils: Pupils are equal, round, and  reactive to light.  Neck:     Thyroid: No thyromegaly.  Cardiovascular:     Rate and Rhythm: Normal rate and regular rhythm.     Heart sounds: Normal heart sounds. No murmur.  Pulmonary:     Effort: Pulmonary effort is normal. No respiratory distress.     Breath sounds: Normal breath sounds. No wheezing.  Abdominal:     General: Bowel sounds are normal. There is no distension.     Palpations: Abdomen is soft.     Tenderness: There is no abdominal tenderness.  Musculoskeletal:        General: No tenderness. Normal range of motion.     Cervical back: Normal range of motion and neck supple.  Skin:    General: Skin is warm and dry.  Neurological:     Mental Status: She is alert and oriented to person, place, and time.     Cranial Nerves: No cranial nerve deficit.     Deep Tendon Reflexes: Reflexes are normal and symmetric.  Psychiatric:        Behavior: Behavior normal.        Thought Content: Thought content normal.        Judgment: Judgment normal.       BP (!) 159/93   Pulse 77   Temp 98.2 F (36.8 C) (Temporal)   Ht '5\' 4"'  (1.626 m)   Wt 163 lb 12.8 oz (74.3 kg)   SpO2 96%   BMI 28.12 kg/m      Assessment & Plan:  MOZEL BURDETT comes in today with chief complaint of Medical Management of Chronic Issues   Diagnosis and orders addressed:  1. GAD (generalized anxiety disorder) Pt reviewed In [place] controlled database, she is followed by pain clinic and gets oxycodone Drug screen and contract updated today - ALPRAZolam (XANAX) 0.5 MG tablet; TAKE 1 TABLET BY MOUTH 2 TIMES A DAY AS NEEDED  Dispense: 60 tablet; Refill: 5 - citalopram (CELEXA) 40 MG tablet; Take 1 tablet (40 mg total) by mouth daily with breakfast. Needs to be seen  Dispense: 90 tablet; Refill: 0 - CMP14+EGFR - CBC with Differential/Platelet  2. Benzodiazepine dependence (HCC) - ALPRAZolam (XANAX) 0.5 MG tablet; TAKE 1 TABLET BY MOUTH 2 TIMES A DAY AS NEEDED  Dispense: 60 tablet; Refill: 5 -  CMP14+EGFR - CBC with Differential/Platelet - ToxASSURE Select 13 (MW), Urine  3. Controlled substance agreement signed - ALPRAZolam (XANAX) 0.5 MG tablet; TAKE 1 TABLET BY MOUTH 2 TIMES A DAY AS NEEDED  Dispense: 60 tablet; Refill: 5 - CMP14+EGFR - CBC with Differential/Platelet  4. Essential hypertension - atenolol (TENORMIN) 50 MG tablet; TAKE 1 TABLET BY MOUTH ONCE DAILY WITH BREAKFAST Needs to be seen  Dispense: 90 tablet; Refill: 0 - lisinopril (ZESTRIL) 20 MG tablet; Take 1 tablet (20 mg total) by mouth daily.  Dispense: 90 tablet; Refill: 3 - CMP14+EGFR - CBC with Differential/Platelet  5. Moderate episode of recurrent major depressive disorder (HCC) - citalopram (CELEXA) 40 MG tablet; Take 1 tablet (40 mg total) by mouth daily with breakfast. Needs to be seen  Dispense: 90 tablet; Refill: 0 - CMP14+EGFR - CBC with Differential/Platelet  6. Insomnia, unspecified type - traZODone (DESYREL) 100 MG tablet; TAKE 1 TABLET BY MOUTH EVERYDAY AT BEDTIME  Dispense: 90 tablet; Refill:  2 - CMP14+EGFR - CBC with Differential/Platelet  7. Gastroesophageal reflux disease, unspecified whether esophagitis present - CMP14+EGFR - CBC with Differential/Platelet  8. Hyperlipidemia, unspecified hyperlipidemia type - CMP14+EGFR - CBC with Differential/Platelet - Lipid panel  9. Chronic bilateral low back pain, unspecified whether sciatica present - CMP14+EGFR - CBC with Differential/Platelet  10. Obesity (BMI 30-39.9) - CMP14+EGFR - CBC with Differential/Platelet   Labs pending Health Maintenance reviewed Diet and exercise encouraged  Follow up plan: 3 months    Evelina Dun, FNP

## 2020-01-29 NOTE — Patient Instructions (Signed)

## 2020-01-30 ENCOUNTER — Other Ambulatory Visit: Payer: Self-pay | Admitting: Family

## 2020-01-30 LAB — LIPID PANEL
Chol/HDL Ratio: 4.3 ratio (ref 0.0–4.4)
Cholesterol, Total: 187 mg/dL (ref 100–199)
HDL: 44 mg/dL (ref >39)
LDL Chol Calc (NIH): 124 mg/dL — ABNORMAL HIGH (ref 0–99)
Triglycerides: 102 mg/dL (ref 0–149)
VLDL Cholesterol Cal: 19 mg/dL (ref 5–40)

## 2020-01-30 LAB — CBC WITH DIFFERENTIAL/PLATELET
Basophils Absolute: 0.1 10*3/uL (ref 0.0–0.2)
Basos: 1 %
EOS (ABSOLUTE): 0.5 10*3/uL — ABNORMAL HIGH (ref 0.0–0.4)
Eos: 5 %
Hematocrit: 46 % (ref 34.0–46.6)
Hemoglobin: 15.1 g/dL (ref 11.1–15.9)
Immature Grans (Abs): 0 10*3/uL (ref 0.0–0.1)
Immature Granulocytes: 0 %
Lymphocytes Absolute: 3.1 10*3/uL (ref 0.7–3.1)
Lymphs: 35 %
MCH: 29.2 pg (ref 26.6–33.0)
MCHC: 32.8 g/dL (ref 31.5–35.7)
MCV: 89 fL (ref 79–97)
Monocytes Absolute: 0.7 10*3/uL (ref 0.1–0.9)
Monocytes: 8 %
Neutrophils Absolute: 4.6 10*3/uL (ref 1.4–7.0)
Neutrophils: 51 %
Platelets: 268 10*3/uL (ref 150–450)
RBC: 5.18 x10E6/uL (ref 3.77–5.28)
RDW: 13.4 % (ref 11.7–15.4)
WBC: 9.1 10*3/uL (ref 3.4–10.8)

## 2020-01-30 LAB — CMP14+EGFR
ALT: 28 IU/L (ref 0–32)
AST: 20 IU/L (ref 0–40)
Albumin/Globulin Ratio: 1.6 (ref 1.2–2.2)
Albumin: 4 g/dL (ref 3.8–4.9)
Alkaline Phosphatase: 134 IU/L — ABNORMAL HIGH (ref 39–117)
BUN/Creatinine Ratio: 14 (ref 12–28)
BUN: 9 mg/dL (ref 8–27)
Bilirubin Total: 0.3 mg/dL (ref 0.0–1.2)
CO2: 28 mmol/L (ref 20–29)
Calcium: 9.4 mg/dL (ref 8.7–10.3)
Chloride: 103 mmol/L (ref 96–106)
Creatinine, Ser: 0.66 mg/dL (ref 0.57–1.00)
GFR calc Af Amer: 111 mL/min/{1.73_m2} (ref >59)
GFR calc non Af Amer: 96 mL/min/{1.73_m2} (ref >59)
Globulin, Total: 2.5 g/dL (ref 1.5–4.5)
Glucose: 90 mg/dL (ref 65–99)
Potassium: 4.4 mmol/L (ref 3.5–5.2)
Sodium: 144 mmol/L (ref 134–144)
Total Protein: 6.5 g/dL (ref 6.0–8.5)

## 2020-01-30 MED ORDER — ROSUVASTATIN CALCIUM 10 MG PO TABS: 10.0000 mg | ORAL_TABLET | Freq: Every day | ORAL | 3 refills | Status: DC

## 2020-01-30 MED ORDER — ATORVASTATIN CALCIUM 20 MG PO TABS: 20.0000 mg | ORAL_TABLET | Freq: Every day | ORAL | 3 refills | Status: DC

## 2020-01-30 NOTE — Addendum Note (Signed)
Addended by: Evelina Dun A on: 01/30/2020 05:23 PM   Modules accepted: Orders

## 2020-02-01 LAB — TOXASSURE SELECT 13 (MW), URINE

## 2020-02-02 ENCOUNTER — Other Ambulatory Visit: Payer: Self-pay | Admitting: Family

## 2020-02-23 DIAGNOSIS — M542 Cervicalgia: Secondary | ICD-10-CM | POA: Diagnosis not present

## 2020-02-23 DIAGNOSIS — M545 Low back pain: Secondary | ICD-10-CM | POA: Diagnosis not present

## 2020-02-23 DIAGNOSIS — G894 Chronic pain syndrome: Secondary | ICD-10-CM | POA: Diagnosis not present

## 2020-03-25 ENCOUNTER — Other Ambulatory Visit: Payer: Self-pay | Admitting: Family

## 2020-03-25 DIAGNOSIS — G894 Chronic pain syndrome: Secondary | ICD-10-CM | POA: Diagnosis not present

## 2020-03-25 DIAGNOSIS — I10 Essential (primary) hypertension: Secondary | ICD-10-CM

## 2020-03-25 DIAGNOSIS — M542 Cervicalgia: Secondary | ICD-10-CM | POA: Diagnosis not present

## 2020-03-25 DIAGNOSIS — M545 Low back pain: Secondary | ICD-10-CM | POA: Diagnosis not present

## 2020-03-26 DIAGNOSIS — M7918 Myalgia, other site: Secondary | ICD-10-CM | POA: Diagnosis not present

## 2020-03-26 DIAGNOSIS — Z79891 Long term (current) use of opiate analgesic: Secondary | ICD-10-CM | POA: Diagnosis not present

## 2020-03-26 DIAGNOSIS — G894 Chronic pain syndrome: Secondary | ICD-10-CM | POA: Diagnosis not present

## 2020-03-26 DIAGNOSIS — M542 Cervicalgia: Secondary | ICD-10-CM | POA: Diagnosis not present

## 2020-03-26 DIAGNOSIS — Z79899 Other long term (current) drug therapy: Secondary | ICD-10-CM | POA: Diagnosis not present

## 2020-03-26 DIAGNOSIS — Z6831 Body mass index (BMI) 31.0-31.9, adult: Secondary | ICD-10-CM | POA: Diagnosis not present

## 2020-04-02 ENCOUNTER — Other Ambulatory Visit: Payer: Self-pay | Admitting: Orthopaedic Surgery

## 2020-04-02 DIAGNOSIS — M542 Cervicalgia: Secondary | ICD-10-CM

## 2020-04-16 DIAGNOSIS — M542 Cervicalgia: Secondary | ICD-10-CM | POA: Diagnosis not present

## 2020-04-16 DIAGNOSIS — G894 Chronic pain syndrome: Secondary | ICD-10-CM | POA: Diagnosis not present

## 2020-04-16 DIAGNOSIS — M4722 Other spondylosis with radiculopathy, cervical region: Secondary | ICD-10-CM | POA: Diagnosis not present

## 2020-04-18 ENCOUNTER — Other Ambulatory Visit: Payer: Medicare PPO

## 2020-04-29 ENCOUNTER — Telehealth: Payer: Self-pay | Admitting: Family

## 2020-04-29 NOTE — Telephone Encounter (Signed)
I would be unwilling to continue Xanax given positive THC on recent UDS in March.  I agree with taper.  If patient wishes to establish care and will continue taper off of medication, she is welcome to do so but rest assured that the plan that Evelina Dun has in place will be continued.

## 2020-04-30 NOTE — Telephone Encounter (Signed)
Patient states that she has stopped alprazolam.  Patient states that her BP elevated since stopping.  Patient taking lisinopril 40mg .  Has televisit with Osa Craver tomorrow.  PCP changed to Dr. Lajuana Ripple and appt made to est care.

## 2020-05-01 ENCOUNTER — Ambulatory Visit: Payer: Medicare PPO | Admitting: Physician Assistant

## 2020-05-02 ENCOUNTER — Other Ambulatory Visit: Payer: Medicare PPO

## 2020-05-16 ENCOUNTER — Telehealth: Payer: Self-pay | Admitting: Cardiology

## 2020-05-16 DIAGNOSIS — M4722 Other spondylosis with radiculopathy, cervical region: Secondary | ICD-10-CM | POA: Diagnosis not present

## 2020-05-16 DIAGNOSIS — G894 Chronic pain syndrome: Secondary | ICD-10-CM | POA: Diagnosis not present

## 2020-05-16 DIAGNOSIS — M542 Cervicalgia: Secondary | ICD-10-CM | POA: Diagnosis not present

## 2020-05-16 NOTE — Telephone Encounter (Signed)
Pt c/o BP problems for the last 4 month since pcp took her off of xanax (has appt 7/22 with pcp and 7/14 appt with Katina Dung, NP) says BP today is 158/92 - last night had chest pain/headache - has been taking lisinopril 60 mg daily and atenolol 150 mg daily for the last few days to try to decrease BP - denies chest pain since last night - pt aware that if symptoms worsen before 7/14 appt pt needs ED evaluation - will forward to provider FYI

## 2020-05-16 NOTE — Telephone Encounter (Signed)
New message    Patient called in for appointment for surgical clearance, she has been to hospital a couple times for her blood pressure not being controlled. Patient was last seen 2018/9, patient wanted you to know that she is taking triple the dose of her blood pressure medication and when she went to bed last evening her bp was 180/120   Please call to advise what to do she is set to see [name] on Monday 05/20/20

## 2020-05-17 NOTE — Telephone Encounter (Signed)
Call her and ask her if her blood pressure has gotten better.  Prefer that she not being on more than 40 mg of lisinopril.  You can add HCTZ 12.5 mg to see if this will help with her blood pressure.  She may need to back off on her atenolol to 100 for now unless her blood pressure has gotten better.  If so tell her to cut back on atenolol to 50 mg.  So would prefer her to take lisinopril 40 mg daily, HCTZ 12.5 mg daily and atenolol 50 mg daily until she sees [place].  Thank you

## 2020-05-19 NOTE — Progress Notes (Addendum)
Cardiology Office Note  Date: 05/20/2020   ID: [name], DOB 07-Mar-1961, MRN [id number]  PCP:  [name], DO  Cardiologist:  No primary care provider on file. Electrophysiologist:  None   Chief Complaint: Essential hypertension  History of Present Illness: [name] is a 59 y.o. female with a history of HTN, CP, HLD.   Last visit with [name] 07/29/2017: She presented with recurrent CP. She did not have NTG prescribed. Stated stress increased incidence of CP. She continued to smoke.  Patient had been taking alprazolam for GAD but tested pos. for [facility] in March. She was being tapered off alprazolam. She was seeing pain management and taking oxycodone for back pain.    She noted that after being taken off alprazolam her blood pressure was elevated. She called our office 05/16/2020 stating her blood pressure had been elevated for the previous 4 months after alprazolam was stopped.  She was increasing her blood pressure medications on her own with Lisinopril 60 mg and Atenolol 150 mg daily.  She presents today with a blood pressure of 184/100.  States she cannot seem to get her blood pressure down with current medications.  States she knows she needs to stop smoking this will help with the blood pressure.  She is also having some stressful domestic issues.  She states this makes her smoke more.  States she has some occasional vague chest pain but no radiation or associated nausea, vomiting, diaphoresis.  States the pain can occur at rest or with exertion.  She denies any CVA or TIA-like symptoms, orthostatic symptoms, palpitations or arrhythmias, PND, orthopnea, lower extremity edema, claudication, DVT or PE-like symptoms.   Past Medical History:  Diagnosis Date  . Abdominal pain   . Anxiety   . Arthritis   . Blood in stool   . Breast changes, fibrocystic   . Bruises easily   . Cardiomegaly 2018  . Clotting disorder (HCC)     itp , none since 1976,  no current hematologist  . Constipation   . DDD (degenerative disc disease), cervical    with lumbar issues  . Depression   . Generalized headaches   . GERD (gastroesophageal reflux disease)   . Hemorrhoid   . Hyperlipidemia   . Hypertension   . Idiopathic thrombocytopenia ([place])    as child  . Leg swelling    both ankles  . Liver lesion   . Nasal congestion   . Nausea & vomiting   . Neuromuscular disorder ([place])    back  injury  . Neuromuscular disorder ([place])    3 neck surgeries,neck fusion.pin,plates,screws  . Osteoporosis   . Panic attacks    pt also  has germaphobia  . Rectal bleeding   . Seizures ([place]) 1998   stress induced, one time, none since, no seizure meds  . Seizures ([place])    Stated she had 2 weeks ago,it was like a transient,stare.  . Sleep apnea    STOPBANG=4  . Spleen enlarged 1976, none since   r/t idiopathic thrombocytopenia  . Trouble swallowing   . Unintentional weight loss   . Weakness    weakness varies    Past Surgical History:  Procedure Laterality Date  . ABDOMINAL HYSTERECTOMY  1997  . BACK SURGERY    . CERVICAL SPINE SURGERY  2005, 2007, 2010   x3, fusion done,plates and screws present  . [place]  . CHOLECYSTECTOMY  03/14/2012   Procedure:  LAPAROSCOPIC CHOLECYSTECTOMY;  Surgeon: Stark Klein, MD;  Location: [facility];  Service: General;  Laterality: N/A;  . colonscopy and esophagogastrodudononescopy  02-04-12  . LUMBAR DISC SURGERY  11/2014    Current Outpatient Medications  Medication Sig Dispense Refill  . amitriptyline (ELAVIL) 25 MG tablet TAKE 1 TO 2 TABLETS BY MOUTH EVERY DAY AT BEDTIME    . atenolol (TENORMIN) 50 MG tablet TAKE 1 TABLET BY MOUTH ONCE DAILY WITH BREAKFAST NEEDS TO BE SEEN 90 tablet 0  . citalopram (CELEXA) 40 MG tablet Take 1 tablet (40 mg total) by mouth daily with breakfast. Needs to be seen 90 tablet 0  . ondansetron (ZOFRAN) 4 MG tablet Take 4 mg by mouth every 8 (eight) hours as needed. for  nausea    . Oxycodone HCl 10 MG TABS Take 5 mg by mouth 2 (two) times daily.     . rosuvastatin (CRESTOR) 10 MG tablet Take 1 tablet (10 mg total) by mouth daily. 90 tablet 3  . traZODone (DESYREL) 100 MG tablet TAKE 1 TABLET BY MOUTH EVERYDAY AT BEDTIME 90 tablet 2  . hydrALAZINE (APRESOLINE) 50 MG tablet Take 1 tablet (50 mg total) by mouth 2 (two) times daily as needed. 180 tablet 1  . lisinopril-hydrochlorothiazide (ZESTORETIC) 20-25 MG tablet Take 1 tablet by mouth daily. 90 tablet 1  . nitroGLYCERIN (NITROSTAT) 0.4 MG SL tablet Place 1 tablet (0.4 mg total) under the tongue every 5 (five) minutes as needed for chest pain. 25 tablet 3   No current facility-administered medications for this visit.   Allergies:  Amoxicillin and Nsaids   Social History: The patient  reports that she has been smoking cigarettes. She started smoking about 14 years ago. She has a 12.00 pack-year smoking history. She has never used smokeless tobacco. She reports that she does not drink alcohol and does not use drugs.   Family History: The patient's family history includes Bipolar disorder in her sister; Cancer in her father and paternal grandmother; Colon cancer in her paternal grandmother; Diabetes in her sister; Heart disease in her maternal grandmother, mother, and sister; Hypertension in her mother and sister; Lung cancer in her father.   ROS:  Please see the history of present illness. Otherwise, complete review of systems is positive for none.  All other systems are reviewed and negative.   Physical Exam: VS:  BP (!) 184/100   Pulse 88   Ht 5\' 4"  (1.626 m)   Wt 168 lb (76.2 kg)   SpO2 97%   BMI 28.84 kg/m , BMI Body mass index is 28.84 kg/m.  Wt Readings from Last 3 Encounters:  05/20/20 168 lb (76.2 kg)  01/29/20 163 lb 12.8 oz (74.3 kg)  02/10/19 196 lb (88.9 kg)    General: Patient appears comfortable at rest. Neck: Supple, no elevated JVP or carotid bruits, no thyromegaly. Lungs: Clear to  auscultation, nonlabored breathing at rest. Cardiac: Regular rate and rhythm, no S3 or significant systolic murmur, no pericardial rub. Extremities: No pitting edema, distal pulses 2+. Skin: Warm and dry. Musculoskeletal: No kyphosis. Neuropsychiatric: Alert and oriented x3, affect grossly appropriate.  ECG:  An ECG dated 05/20/2020. was personally reviewed today and demonstrated:  Normal sinus rhythm rate of 76, nonspecific ST abnormality.  Recent Labwork: 01/29/2020: ALT 28; AST 20; BUN 9; Creatinine, Ser 0.66; Hemoglobin 15.1; Platelets 268; Potassium 4.4; Sodium 144     Component Value Date/Time   CHOL 187 01/29/2020 1149   TRIG 102 01/29/2020 1149  HDL 44 01/29/2020 1149   CHOLHDL 4.3 01/29/2020 1149   CHOLHDL 4.1 Ratio 01/25/2007 1851   VLDL 32 01/25/2007 1851   LDLCALC 124 (H) 01/29/2020 1149    Other Studies Reviewed Today:   Echocardiogram 07/01/2017 Left ventricle: The cavity size was normal. Wall thickness was increased in a pattern of mild LVH. Systolic function was normal. The estimated ejection fraction was in the range of 60% to 65%. Wall motion was normal; there were no regional wall motion abnormalities. Features are consistent with a pseudonormal left ventricular filling pattern, with concomitant abnormal relaxation and increased filling pressure (grade 2 diastolic dysfunction). Indeterminate filling pressures. - Aortic valve: There was mild regurgitation. - Mitral valve: There was mild regurgitation. - Systemic veins: Dilated IVC with normal respiratory variation. Estimated CVP 8 mmHg.  NM Stress Test 08/06/2016 Study Result    No diagnostic ST segment changes with Lexiscan infusion. Patient did develop chest pain and shortness of breath 10-15 minutes after the completion of the study. This was not associated with any acute ST segment changes by repeat ECG, and blood pressure was stable. Symptoms resolved spontaneously.  Small, mild  intensity, partially reversible mid to apical anterior defect suggestive of variable breast attenuation artifact, less likely mild region of ischemia.  This is a low risk study.  Nuclear stress EF: 82%.     Assessment and Plan:  1. Recurrent chest pain   2. Essential hypertension   3. Tobacco abuse    1. Recurrent chest pain Complains of occasional chest pains which are nonspecific and vague.  States they can occur with and without exertion.  Denies any radiation to neck, arm, back, jaw.  Denies any associated nausea, vomiting, diaphoresis.  2. Essential hypertension Blood pressure on arrival 184/100.  Patient states her blood pressures have been elevated since her alprazolam was stopped approximately 4 to 5 months ago.  Stop lisinopril.  Start lisinopril/HCTZ 20/25 mg daily.  Add hydralazine 50 mg p.o. twice daily as needed for sustained blood pressure greater than 140/90.  If blood pressure consistently staying above 140/90 take the hydralazine on a schedule.  Start checking your blood pressures daily for 2 weeks and follow-up  nursing visit in 2 weeks.  Bring log of blood pressures with you.  We may need to adjust hydralazine dosage and/or frequency.  Get BMP and magnesium in 2 weeks  3. Tobacco abuse Continues to smoke.  States she smokes more when she is stressed out and she is having current issues at home which cause her increased stress.  She is aware she needs to stop.  Encouraged cessation.  Medication Adjustments/Labs and Tests Ordered: Current medicines are reviewed at length with the patient today.  Concerns regarding medicines are outlined above.   Disposition: Follow-up with Dr. Harl Bowie or APP 1 month  Signed, Levell July, Garza 05/20/2020 2:49 PM    [facility] [place], [place], [place] [address] Phone: [phone]; Fax: [phone]

## 2020-05-20 ENCOUNTER — Other Ambulatory Visit: Payer: Self-pay

## 2020-05-20 ENCOUNTER — Ambulatory Visit (INDEPENDENT_AMBULATORY_CARE_PROVIDER_SITE_OTHER): Payer: Medicare HMO | Admitting: Family Medicine

## 2020-05-20 ENCOUNTER — Encounter: Payer: Self-pay | Admitting: Family Medicine

## 2020-05-20 DIAGNOSIS — Z720 Tobacco use: Secondary | ICD-10-CM | POA: Diagnosis not present

## 2020-05-20 DIAGNOSIS — R079 Chest pain, unspecified: Secondary | ICD-10-CM | POA: Diagnosis not present

## 2020-05-20 DIAGNOSIS — I10 Essential (primary) hypertension: Secondary | ICD-10-CM

## 2020-05-20 MED ORDER — HYDRALAZINE HCL 50 MG PO TABS: 50.0000 mg | ORAL_TABLET | Freq: Two times a day (BID) | ORAL | 1 refills | Status: DC | PRN

## 2020-05-20 MED ORDER — LISINOPRIL-HYDROCHLOROTHIAZIDE 20-25 MG PO TABS: 1.0000 | ORAL_TABLET | Freq: Every day | ORAL | 1 refills | Status: DC

## 2020-05-20 NOTE — Patient Instructions (Signed)
Your physician recommends that you schedule a follow-up appointment in: [place], NP AND 2 [place] BLOOD PRESSURE CHECK  Your physician has recommended you make the following change in your medication:   STOP LISINOPRIL   START LISINOPRIL/HCTZ 20/25 MG DAILY   START HYDRALAZINE 50 MG TWICE DAILY AS NEEDED FOR BLOOD PRESSURE ABOVE 140/90   Your physician recommends that you return for lab work in: 2 WEEKS BMP/MG  Thank you for choosing [facility]!!

## 2020-05-21 NOTE — Telephone Encounter (Signed)
Addressed in clinic 7/12 appt with provider

## 2020-05-23 ENCOUNTER — Other Ambulatory Visit: Payer: Self-pay

## 2020-05-23 ENCOUNTER — Ambulatory Visit
Admission: RE | Admit: 2020-05-23 | Discharge: 2020-05-23 | Disposition: A | Discharge status: Home / Self Care | Payer: Medicare HMO | Source: Ambulatory Visit | Attending: Orthopaedic Surgery | Admitting: Orthopaedic Surgery

## 2020-05-23 DIAGNOSIS — G9589 Other specified diseases of spinal cord: Secondary | ICD-10-CM | POA: Diagnosis not present

## 2020-05-23 DIAGNOSIS — M5021 Other cervical disc displacement,  high cervical region: Secondary | ICD-10-CM | POA: Diagnosis not present

## 2020-05-23 DIAGNOSIS — M47814 Spondylosis without myelopathy or radiculopathy, thoracic region: Secondary | ICD-10-CM | POA: Diagnosis not present

## 2020-05-23 DIAGNOSIS — M542 Cervicalgia: Secondary | ICD-10-CM

## 2020-05-23 DIAGNOSIS — M47812 Spondylosis without myelopathy or radiculopathy, cervical region: Secondary | ICD-10-CM | POA: Diagnosis not present

## 2020-05-30 ENCOUNTER — Encounter: Payer: Self-pay | Admitting: Family Medicine

## 2020-05-30 ENCOUNTER — Other Ambulatory Visit: Payer: Self-pay

## 2020-05-30 ENCOUNTER — Ambulatory Visit (INDEPENDENT_AMBULATORY_CARE_PROVIDER_SITE_OTHER): Payer: Medicare HMO | Admitting: Family Medicine

## 2020-05-30 VITALS — BP 135/90 | HR 91 | Temp 97.2°F | Ht 64.0 in | Wt 154.4 lb

## 2020-05-30 DIAGNOSIS — F5104 Psychophysiologic insomnia: Secondary | ICD-10-CM

## 2020-05-30 DIAGNOSIS — Z7689 Persons encountering health services in other specified circumstances: Secondary | ICD-10-CM

## 2020-05-30 DIAGNOSIS — Z789 Other specified health status: Secondary | ICD-10-CM | POA: Diagnosis not present

## 2020-05-30 DIAGNOSIS — I10 Essential (primary) hypertension: Secondary | ICD-10-CM | POA: Diagnosis not present

## 2020-05-30 DIAGNOSIS — R634 Abnormal weight loss: Secondary | ICD-10-CM

## 2020-05-30 DIAGNOSIS — F331 Major depressive disorder, recurrent, moderate: Secondary | ICD-10-CM

## 2020-05-30 DIAGNOSIS — F411 Generalized anxiety disorder: Secondary | ICD-10-CM

## 2020-05-30 DIAGNOSIS — E663 Overweight: Secondary | ICD-10-CM

## 2020-05-30 DIAGNOSIS — M2550 Pain in unspecified joint: Secondary | ICD-10-CM | POA: Diagnosis not present

## 2020-05-30 DIAGNOSIS — Z720 Tobacco use: Secondary | ICD-10-CM | POA: Diagnosis not present

## 2020-05-30 DIAGNOSIS — Z8261 Family history of arthritis: Secondary | ICD-10-CM

## 2020-05-30 DIAGNOSIS — E7800 Pure hypercholesterolemia, unspecified: Secondary | ICD-10-CM

## 2020-05-30 DIAGNOSIS — R69 Illness, unspecified: Secondary | ICD-10-CM | POA: Diagnosis not present

## 2020-05-30 LAB — BAYER DCA HB A1C WAIVED: HB A1C (BAYER DCA - WAIVED): 5.6 % (ref <7.0)

## 2020-05-30 MED ORDER — TRAZODONE HCL 100 MG PO TABS: 150.0000 mg | ORAL_TABLET | Freq: Every evening | ORAL | 2 refills | Status: DC | PRN

## 2020-05-30 MED ORDER — NITROGLYCERIN 0.4 MG SL SUBL: 0.4000 mg | SUBLINGUAL_TABLET | SUBLINGUAL | 3 refills | Status: DC | PRN

## 2020-05-30 MED ORDER — BUSPIRONE HCL 5 MG PO TABS: ORAL_TABLET | ORAL | 0 refills | Status: DC

## 2020-05-30 NOTE — Addendum Note (Signed)
Addended by: [name] on: 05/30/2020 05:18 PM   Modules accepted: Orders

## 2020-05-30 NOTE — Progress Notes (Signed)
Subjective: CC:est care, f/u accelerated HTN, HLD, GAD, Depression PCP: [name], DO [name] is a 59 y.o. female presenting to clinic today for:  1. HTN, HLD Patient saw her cardiologist office about 2 weeks ago. Her medications were changed to lisinopril-hydrochlorothiazide 20-25 mg daily and she was given as needed hydralazine 50 mg to take twice daily if blood pressures were elevated. She continues to take the atenolol as prescribed. At her last visit with previous PCP her Lipitor was switched to Crestor. She notes that BPs have been much better.  She had some relative hypotension and has not used Hydralazine ongoing.  No CP, edema, falls.  2. Generalized anxiety disorder/depression History: hospitalized '98 for pseudoseizures.  She was started on benzo at that time.    Patient is treated with Celexa, amitriptyline (neuropathy/ radiculopathy) and trazodone. She was previously on Xanax as well but she failed a drug screen and therefore this was tapered off.  Apparently, the THC was due to consumption of "gummies".  Unfortunately, this taper seems to correlate with her increased blood pressures. She reports difficulty falling and staying asleep despite Trazodone and Amitriptyline 25-33m qhs.  No serotonin like symptoms.  She feels that depression Is well controlled with Celexa.  3. Unplanned weight loss Patient reports unplanned weight loss over the last year.  She is an active every day smoker and has been since age 59  She smokes 1 ppd.  She reports night sweats.  No hematochezia, melena, hemoptysis.  She has had frequent URIs.  H/o cystic liver mass s/p resection.   ROS: Per HPI  Allergies  Allergen Reactions   Amoxicillin Diarrhea   Nsaids Nausea Only    Abdominal pain   Past Medical History:  Diagnosis Date   Abdominal pain    Anxiety    Arthritis    Blood in stool    Breast changes, fibrocystic    Bruises easily    Cardiomegaly 2018    Clotting disorder (HCC)     itp , none since 1976, no current hematologist   Constipation    DDD (degenerative disc disease), cervical    with lumbar issues   Depression    Generalized headaches    GERD (gastroesophageal reflux disease)    Hemorrhoid    Hyperlipidemia    Hypertension    Idiopathic thrombocytopenia (HCC)    as child   Leg swelling    both ankles   Liver lesion    Nasal congestion    Nausea & vomiting    Neuromuscular disorder (HCC)    back  injury   Neuromuscular disorder ([place]    3 neck surgeries,neck fusion.pin,plates,screws   Osteoporosis    Panic attacks    pt also  has germaphobia   Rectal bleeding    [place]izures [place] 1998   stress induced, one time, none since, no seizure meds   Seizures ([place]    Stated she had 2 weeks ago,it was like a transient,stare.   Sleep apnea    STOPBANG=4   Spleen enlarged 1976, none since   r/t idiopathic thrombocytopenia   Trouble swallowing    Unintentional weight loss    Weakness    weakness varies    Current Outpatient Medications:    amitriptyline (ELAVIL) 25 MG tablet, TAKE 1 TO 2 TABLETS BY MOUTH EVERY DAY AT BEDTIME, Disp: , Rfl:    atenolol (TENORMIN) 50 MG tablet, TAKE 1 TABLET BY MOUTH ONCE DAILY WITH BREAKFAST NEEDS TO BE SEEN,  Disp: 90 tablet, Rfl: 0   citalopram (CELEXA) 40 MG tablet, Take 1 tablet (40 mg total) by mouth daily with breakfast. Needs to be seen, Disp: 90 tablet, Rfl: 0   hydrALAZINE (APRESOLINE) 50 MG tablet, Take 1 tablet (50 mg total) by mouth 2 (two) times daily as needed., Disp: 180 tablet, Rfl: 1   lisinopril-hydrochlorothiazide (ZESTORETIC) 20-25 MG tablet, Take 1 tablet by mouth daily., Disp: 90 tablet, Rfl: 1   nitroGLYCERIN (NITROSTAT) 0.4 MG SL tablet, Place 1 tablet (0.4 mg total) under the tongue every 5 (five) minutes as needed for chest pain., Disp: 25 tablet, Rfl: 3   ondansetron (ZOFRAN) 4 MG tablet, Take 4 mg by mouth every 8 (eight) hours as  needed. for nausea, Disp: , Rfl:    Oxycodone HCl 10 MG TABS, Take 5 mg by mouth 2 (two) times daily. , Disp: , Rfl:    rosuvastatin (CRESTOR) 10 MG tablet, Take 1 tablet (10 mg total) by mouth daily., Disp: 90 tablet, Rfl: 3   traZODone (DESYREL) 100 MG tablet, TAKE 1 TABLET BY MOUTH EVERYDAY AT BEDTIME, Disp: 90 tablet, Rfl: 2 Social History   Socioeconomic History   Marital status: Married    Spouse name: Legrand Como   Number of children: 3   Years of education: 14   Highest education level: Associate degree: academic program  Occupational History   Occupation: Corporate investment banker: UNEMPLOYED  Tobacco Use   Smoking status: Current Every Day Smoker    Packs/day: 1.00    Years: 12.00    Pack years: 12.00    Types: Cigarettes    Start date: 12/20/2005   Smokeless tobacco: Never Used  Vaping Use   Vaping Use: Former   Quit date: 09/07/2016   Devices: only used for about a month  Substance and Sexual Activity   Alcohol use: No    Alcohol/week: 0.0 standard drinks   Drug use: No   Sexual activity: Yes  Other Topics Concern   Not on file  Social History Narrative   She lives with her boyfriend.  She used to smoke, quit May 2013, she is not drinking. She has 3 children   Social Determinants of Radio broadcast assistant Strain: High Risk   Difficulty of Paying Living Expenses: Hard  Food Insecurity: Food Insecurity Present   Worried About Charity fundraiser in the Last Year: Sometimes true   Ran Out of Food in the Last Year: Never true  Transportation Needs: No Transportation Needs   Lack of Transportation (Medical): No   Lack of Transportation (Non-Medical): No  Physical Activity: Sufficiently Active   Days of Exercise per Week: 7 days   Minutes of Exercise per Session: 60 min  Stress: No Stress Concern Present   Feeling of Stress : Not at all  Social Connections: Socially Integrated   Frequency of Communication with Friends and Family: More  than three times a week   Frequency of Social Gatherings with Friends and Family: More than three times a week   Attends Religious Services: More than 4 times per year   Active Member of [employer] or Organizations: Yes   Attends Music therapist: More than 4 times per year   Marital Status: Married  Human resources officer Violence: Not At Risk   Fear of Current or Ex-Partner: No   Emotionally Abused: No   Physically Abused: No   Sexually Abused: No   Family History  Problem Relation Age of Onset  Hypertension Mother    Heart disease Mother    Lung cancer Father    Cancer Father        lung   Colon cancer Paternal Grandmother    Cancer Paternal Grandmother        colon   Heart disease Maternal Grandmother    Diabetes Sister    Bipolar disorder Sister    Heart disease Sister        Congestive Heart Failure   Hypertension Sister     Objective: Office vital signs reviewed. BP (!) 135/90    Pulse 91    Temp (!) 97.2 F (36.2 C)    Ht _0  (1.626 m)    Wt 154 lb 6.4 oz (70 kg)    SpO2 97%    BMI 26.50 kg/m   Physical Examination:  General: Awake, alert, nontoxic, No acute distress HEENT: Normal; sclera white Cardio: regular rate and rhythm, S1S2 heard, no murmurs appreciated Pulm: Slightly decreased breath sounds.  Otherwise clear to auscultation bilaterally, no wheezes, rhonchi or rales; normal work of breathing on room air Extremities: warm, well perfused, No edema, cyanosis or clubbing; +2 pulses bilaterally MSK: normal gait and station; arthritic changes noted in bilateral hands.  No joint warmth or erythema Skin: dry; intact; no rashes or lesions Neuro: AOx3; no tremor Psych: mood stable, speech slightly pressured Depression screen [facility] 2/9 05/30/2020 01/29/2020 09/08/2019  Decreased Interest 0 0 0  Down, Depressed, Hopeless 0 0 0  PHQ - 2 Score 0 0 0  Altered sleeping - 0 -  Tired, decreased energy - 1 -  Change in appetite - 1 -  Feeling bad  or failure about yourself  - 0 -  Trouble concentrating - 1 -  Moving slowly or fidgety/restless - 0 -  Suicidal thoughts - 0 -  PHQ-9 Score - 3 -  Difficult doing work/chores - Not difficult at all -  Some recent data might be hidden   GAD 7 : Generalized Anxiety Score 05/30/2020 01/29/2020  Nervous, Anxious, on Edge 3 1  Control/stop worrying 3 1  Worry too much - different things 3 1  Trouble relaxing 3 1  Restless 3 0  Easily annoyed or irritable 3 0  Afraid - awful might happen 0 0  Total GAD 7 Score 18 4  Anxiety Difficulty - Not difficult at all   Assessment/ Plan: 59 y.o. female   1. Essential hypertension Controlled. Will cc labs to cardiologist - Magnesium - Bayer DCA Hb A1c Waived - CMP14+EGFR  2. Pure hypercholesterolemia Check fasting lipid (labs obtained this am) - Bayer DCA Hb A1c Waived - Lipid Panel - CMP14+EGFR  3. Unintended weight loss CT check due to smoking status.  She has lost >30lb since 02/2019.  Recommended follow up with GI given history of liver tumor and weight loss now. - CT Chest Wo Contrast; Future  4. Tobacco use High risk for lung cancer - CT Chest Wo Contrast; Future  5. Overweight (BMI 25.0-29.9) - Bayer DCA Hb A1c Waived  6. Establishing care with new doctor, encounter for  7. GAD (generalized anxiety disorder) Trial buspar.  [employer] information provided - busPIRone (BUSPAR) 5 MG tablet; Take 1 tablet (5 mg total) by mouth 2 (two) times daily for 7 days, THEN 2 tablets (10 mg total) 2 (two) times daily for 7 days, THEN 3 tablets (15 mg total) 2 (two) times daily for 21 days.  Dispense: 135 tablet; Refill: 0  8. Moderate episode  of recurrent major depressive disorder (HCC) Stable on celexa  9. Psychophysiological insomnia Consulted clinical pharmacist.  Increase trazodone to 138m.  Discussed serotonin syndrome. - traZODone (DESYREL) 100 MG tablet; Take 1.5 tablets (150 mg total) by mouth at bedtime as needed for sleep.   Dispense: 135 tablet; Refill: 2  10. Under care of pain management specialist  11. Polyarthralgia Evaluate for autoimmune disease - Sedimentation Rate - C-reactive protein - ANA w/Reflex if Positive - Rheumatoid factor  12. Family history of rheumatoid arthritis - Sedimentation Rate - C-reactive protein - Rheumatoid factor   No orders of the defined types were placed in this encounter.  No orders of the defined types were placed in this encounter.    AJanora Norlander DO [place]([phone]

## 2020-05-30 NOTE — Patient Instructions (Addendum)
Mammogram to be scheduled at check out  Reduce the amitriptyline to 1/2 to 1 tablet at bedtime only.  May increase the trazodone to 1.5 tablets. Continue Celexa 40mg  Buspar added.  You had labs performed today.  You will be contacted with the results of the labs once they are available, usually in the next 3 business days for routine lab work.  If you have an active my chart account, they will be released to your MyChart.  If you prefer to have these labs released to you via telephone, please let us know.  If you had a pap smear or biopsy performed, expect to be contacted in about 7-10 days.  Serotonin Syndrome Serotonin is a chemical in your body (neurotransmitter) that helps to control several functions, such as:  Brain and nerve cell function.  Mood and emotions.  Memory.  Eating.  Sleeping.  Sexual activity.  Stress response. Having too much serotonin in your body can cause serotonin syndrome. This condition can be harmful to your brain and nerve cells. This can be a life-threatening condition. What are the causes? This condition may be caused by taking medicines or drugs that increase the level of serotonin in your body, such as:  Antidepressant medicines.  Migraine medicines.  Certain pain medicines.  Certain drugs, including ecstasy, LSD, cocaine, and amphetamines.  Over-the-counter cough or cold medicines that contain dextromethorphan.  Certain herbal supplements, including St. John's wort, ginseng, and nutmeg. This condition usually occurs when you take these medicines or drugs in combination, but it can also happen with a high dose of a single medicine or drug. What increases the risk? You are more likely to develop this condition if:  You just started taking a medicine or drug that increases the level of serotonin in the body.  You recently increased the dose of a medicine or drug that increases the level of serotonin in the body.  You take more than one  medicine or drug that increases the level of serotonin in the body. What are the signs or symptoms? Symptoms of this condition usually start within several hours of taking a medicine or drug. Symptoms may be mild or severe. Mild symptoms include:  Sweating.  Restlessness or agitation.  Muscle twitching or stiffness.  Rapid heart rate.  Nausea and vomiting.  Diarrhea.  Headache.  Shivering or goose bumps.  Confusion. Severe symptoms include:  Irregular heartbeat.  Seizures.  Loss of consciousness.  High fever. How is this diagnosed? This condition may be diagnosed based on:  Your medical history.  A physical exam.  Your prior use of drugs and medicines.  Blood or urine tests. These may be used to rule out other causes of your symptoms. How is this treated? The treatment for this condition depends on the severity of your symptoms.  For mild cases, stopping the medicine or drug that caused your condition is usually all that is needed.  For moderate to severe cases, treatment in a hospital may be needed to prevent or manage life-threatening symptoms. This may include medicines to control your symptoms, IV fluids, interventions to support your breathing, and treatments to control your body temperature. Follow these instructions at home: Medicines   Take over-the-counter and prescription medicines only as told by your health care provider. This is important.  Check with your health care provider before you start taking any new prescriptions, over-the-counter medicines, herbs, or supplements.  Avoid combining any medicines that can cause this condition to occur. Lifestyle   Maintain  a healthy lifestyle. ? Eat a healthy diet that includes plenty of vegetables, fruits, whole grains, low-fat dairy products, and lean protein. Do not eat a lot of foods that are high in fat, added sugars, or salt. ? Get the right amount and quality of sleep. Most adults need 7-9 hours  of sleep each night. ? Make time to exercise, even if it is only for short periods of time. Most adults should exercise for at least 150 minutes each week. ? Do not drink alcohol. ? Do not use illegal drugs, and do not take medicines for reasons other than they are prescribed. General instructions  Do not use any products that contain nicotine or tobacco, such as cigarettes and e-cigarettes. If you need help quitting, ask your health care provider.  Keep all follow-up visits as told by your health care provider. This is important. Contact a health care provider if:  Your symptoms do not improve or they get worse. Get help right away if you:  Have worsening confusion, severe headache, chest pain, high fever, seizures, or loss of consciousness.  Experience serious side effects of medicine, such as swelling of your face, lips, tongue, or throat.  Have serious thoughts about hurting yourself or others. These symptoms may represent a serious problem that is an emergency. Do not wait to see if the symptoms will go away. Get medical help right away. Call your local emergency services (911 in the [place] Do not drive yourself to the hospital. If you ever feel like you may hurt yourself or others, or have thoughts about taking your own life, get help right away. You can go to your nearest emergency department or call:  Your local emergency services (911 in the [place]  A suicide crisis helpline, such as the [facility] at 515-212-3926. This is open 24 hours a day. Summary  Serotonin is a brain chemical that helps to regulate the nervous system. High levels of serotonin in the body can cause serotonin syndrome, which is a very dangerous condition.  This condition may be caused by taking medicines or drugs that increase the level of serotonin in your body.  Treatment depends on the severity of your symptoms. For mild cases, stopping the medicine or drug that caused your  condition is usually all that is needed.  Check with your health care provider before you start taking any new prescriptions, over-the-counter medicines, herbs, or supplements. This information is not intended to replace advice given to you by your health care provider. Make sure you discuss any questions you have with your health care provider. Document Revised: 12/03/2017 Document Reviewed: 12/03/2017 Elsevier Patient Education  2020 [employer].

## 2020-05-31 LAB — CMP14+EGFR
ALT: 19 IU/L (ref 0–32)
AST: 16 IU/L (ref 0–40)
Albumin/Globulin Ratio: 1.7 (ref 1.2–2.2)
Albumin: 4.5 g/dL (ref 3.8–4.8)
Alkaline Phosphatase: 125 IU/L — ABNORMAL HIGH (ref 48–121)
BUN/Creatinine Ratio: 21 (ref 12–28)
BUN: 16 mg/dL (ref 8–27)
Bilirubin Total: 0.3 mg/dL (ref 0.0–1.2)
CO2: 28 mmol/L (ref 20–29)
Calcium: 9.9 mg/dL (ref 8.7–10.3)
Chloride: 94 mmol/L — ABNORMAL LOW (ref 96–106)
Creatinine, Ser: 0.78 mg/dL (ref 0.57–1.00)
GFR calc Af Amer: 95 mL/min/{1.73_m2} (ref >59)
GFR calc non Af Amer: 82 mL/min/{1.73_m2} (ref >59)
Globulin, Total: 2.7 g/dL (ref 1.5–4.5)
Glucose: 89 mg/dL (ref 65–99)
Potassium: 4.1 mmol/L (ref 3.5–5.2)
Sodium: 136 mmol/L (ref 134–144)
Total Protein: 7.2 g/dL (ref 6.0–8.5)

## 2020-05-31 LAB — MAGNESIUM: Magnesium: 1.9 mg/dL (ref 1.6–2.3)

## 2020-05-31 LAB — LIPID PANEL
Chol/HDL Ratio: 4.2 ratio (ref 0.0–4.4)
Cholesterol, Total: 228 mg/dL — ABNORMAL HIGH (ref 100–199)
HDL: 54 mg/dL (ref >39)
LDL Chol Calc (NIH): 149 mg/dL — ABNORMAL HIGH (ref 0–99)
Triglycerides: 142 mg/dL (ref 0–149)
VLDL Cholesterol Cal: 25 mg/dL (ref 5–40)

## 2020-05-31 LAB — SEDIMENTATION RATE: Sed Rate: 25 mm/hr (ref 0–40)

## 2020-05-31 LAB — ANA W/REFLEX IF POSITIVE: Anti Nuclear Antibody (ANA): NEGATIVE

## 2020-05-31 LAB — C-REACTIVE PROTEIN: CRP: 8 mg/L (ref 0–10)

## 2020-05-31 LAB — RHEUMATOID FACTOR: Rheumatoid fact SerPl-aCnc: <10 IU/mL (ref 0.0–13.9)

## 2020-06-02 ENCOUNTER — Encounter: Payer: Self-pay | Admitting: Family Medicine

## 2020-06-03 ENCOUNTER — Telehealth: Payer: Self-pay | Admitting: Family Medicine

## 2020-06-03 ENCOUNTER — Other Ambulatory Visit: Payer: Self-pay

## 2020-06-03 MED ORDER — ROSUVASTATIN CALCIUM 20 MG PO TABS
20.0000 mg | ORAL_TABLET | Freq: Every day | ORAL | 3 refills | Status: DC
Start: 2020-06-03 — End: 2021-05-05

## 2020-06-04 ENCOUNTER — Ambulatory Visit: Payer: Medicare HMO

## 2020-06-05 ENCOUNTER — Encounter: Payer: Self-pay | Admitting: Family Medicine

## 2020-06-06 ENCOUNTER — Other Ambulatory Visit: Payer: Self-pay | Admitting: Family

## 2020-06-06 ENCOUNTER — Encounter: Payer: Self-pay | Admitting: Family

## 2020-06-06 ENCOUNTER — Ambulatory Visit (INDEPENDENT_AMBULATORY_CARE_PROVIDER_SITE_OTHER): Payer: Medicare HMO | Admitting: Family

## 2020-06-06 ENCOUNTER — Ambulatory Visit (INDEPENDENT_AMBULATORY_CARE_PROVIDER_SITE_OTHER): Payer: Medicare HMO

## 2020-06-06 ENCOUNTER — Ambulatory Visit: Payer: Medicare HMO | Admitting: Family

## 2020-06-06 VITALS — BP 152/91 | HR 98 | Temp 97.9°F | Ht 64.0 in | Wt 152.0 lb

## 2020-06-06 DIAGNOSIS — R05 Cough: Secondary | ICD-10-CM

## 2020-06-06 DIAGNOSIS — B9689 Other specified bacterial agents as the cause of diseases classified elsewhere: Secondary | ICD-10-CM | POA: Diagnosis not present

## 2020-06-06 DIAGNOSIS — J208 Acute bronchitis due to other specified organisms: Secondary | ICD-10-CM

## 2020-06-06 DIAGNOSIS — R059 Cough, unspecified: Secondary | ICD-10-CM

## 2020-06-06 MED ORDER — PREDNISONE 10 MG (21) PO TBPK: ORAL_TABLET | ORAL | 0 refills | Status: DC

## 2020-06-06 MED ORDER — AZITHROMYCIN 250 MG PO TABS: ORAL_TABLET | ORAL | 0 refills | Status: DC

## 2020-06-06 NOTE — Patient Instructions (Signed)
Acute Bronchitis, Adult  Acute bronchitis is sudden or acute swelling of the air tubes (bronchi) in the lungs. Acute bronchitis causes these tubes to fill with mucus, which can make it hard to breathe. It can also cause coughing or wheezing. In adults, acute bronchitis usually goes away within 2 weeks. A cough caused by bronchitis may last up to 3 weeks. Smoking, allergies, and asthma can make the condition worse. What are the causes? This condition can be caused by germs and by substances that irritate the lungs, including:  Cold and flu viruses. The most common cause of this condition is the virus that causes the common cold.  Bacteria.  Substances that irritate the lungs, including: ? Smoke from cigarettes and other forms of tobacco. ? Dust and pollen. ? Fumes from chemical products, gases, or burned fuel. ? Other materials that pollute indoor or outdoor air.  Close contact with someone who has acute bronchitis. What increases the risk? The following factors may make you more likely to develop this condition:  A weak body's defense system, also called the immune system.  A condition that affects your lungs and breathing, such as asthma. What are the signs or symptoms? Common symptoms of this condition include:  Lung and breathing problems, such as: ? Coughing. This may bring up clear, yellow, or green mucus from your lungs (sputum). ? Wheezing. ? Having too much mucus in your lungs (chest congestion). ? Having shortness of breath.  A fever.  Chills.  Aches and pains, including: ? Tightness in your chest and other body aches. ? A sore throat. How is this diagnosed? This condition is usually diagnosed based on:  Your symptoms and medical history.  A physical exam. You may also have other tests, including tests to rule out other conditions, such pneumonia. These tests include:  A test of lung function.  Test of a mucus sample to look for the presence of  bacteria.  Tests to check the oxygen level in your blood.  Blood tests.  Chest X-ray. How is this treated? Most cases of acute bronchitis clear up over time without treatment. Your health care provider may recommend:  Drinking more fluids. This can thin your mucus, which may improve your breathing.  Taking a medicine for a fever or cough.  Using a device that gets medicine into your lungs (inhaler) to help improve breathing and control coughing.  Using a vaporizer or a humidifier. These are machines that add water to the air to help you breathe better. Follow these instructions at home: Activity  Get plenty of rest.  Return to your normal activities as told by your health care provider. Ask your health care provider what activities are safe for you. Lifestyle  Drink enough fluid to keep your urine pale yellow.  Do not drink alcohol.  Do not use any products that contain nicotine or tobacco, such as cigarettes, e-cigarettes, and chewing tobacco. If you need help quitting, ask your health care provider. Be aware that: ? Your bronchitis will get worse if you smoke or breathe in other people's smoke (secondhand smoke). ? Your lungs will heal faster if you quit smoking. General instructions   Take over-the-counter and prescription medicines only as told by your health care provider.  Use an inhaler, vaporizer, or humidifier as told by your health care provider.  If you have a sore throat, gargle with a salt-water mixture 3-4 times a day or as needed. To make a salt-water mixture, completely dissolve -1 tsp (3-6   g) of salt in 1 cup (237 mL) of warm water.  Keep all follow-up visits as told by your health care provider. This is important. How is this prevented? To lower your risk of getting this condition again:  Wash your hands often with soap and water. If soap and water are not available, use hand sanitizer.  Avoid contact with people who have cold symptoms.  Try not to  touch your mouth, nose, or eyes with your hands.  Avoid places where there are fumes from chemicals. Breathing these fumes will make your condition worse.  Get the flu shot every year. Contact a health care provider if:  Your symptoms do not improve after 2 weeks of treatment.  You vomit more than once or twice.  You have symptoms of dehydration such as: ? Dark urine. ? Dry skin or eyes. ? Increased thirst. ? Headaches. ? Confusion. ? Muscle cramps. Get help right away if you:  Cough up blood.  Feel pain in your chest.  Have severe shortness of breath.  Faint or keep feeling like you are going to faint.  Have a severe headache.  Have fever or chills that get worse. These symptoms may represent a serious problem that is an emergency. Do not wait to see if the symptoms will go away. Get medical help right away. Call your local emergency services (911 in the [place] Do not drive yourself to the hospital. Summary  Acute bronchitis is sudden (acute) inflammation of the air tubes (bronchi) between the windpipe and the lungs. In adults, acute bronchitis usually goes away within 2 weeks, although coughing may last 3 weeks or longer  Take over-the-counter and prescription medicines only as told by your health care provider.  Drink enough fluid to keep your urine pale yellow.  Contact a health care provider if your symptoms do not improve after 2 weeks of treatment.  Get help right away if you cough up blood, faint, or have chest pain or shortness of breath. This information is not intended to replace advice given to you by your health care provider. Make sure you discuss any questions you have with your health care provider. Document Revised: 07/10/2019 Document Reviewed: 05/19/2019 Elsevier Patient Education  2020 [employer]  

## 2020-06-06 NOTE — Progress Notes (Signed)
Subjective:    Patient ID: [name], female    DOB: 1961-06-16, 59 y.o.   MRN: [id number]  Chief Complaint  Patient presents with  . Cough    hurts in chest when breathing   . Headache  . Sore Throat  . Nausea  . Fever    Tuesday was last fever   Pt presents to the office today for cough that started over a week ago. She reports her husband had similar symptoms and went to the Urgent Care on Saturday and was diagnosed with pneumonia.  Cough This is a new problem. The current episode started 1 to 4 weeks ago. The problem has been gradually worsening. The problem occurs constantly. The cough is productive of purulent sputum. Associated symptoms include chills, ear pain, a fever, headaches, nasal congestion, postnasal drip, a sore throat, shortness of breath and wheezing. Pertinent negatives include no ear congestion or myalgias. The symptoms are aggravated by lying down. Risk factors for lung disease include smoking/tobacco exposure. She has tried rest for the symptoms. The treatment provided mild relief. Her past medical history is significant for COPD.  Headache  Associated symptoms include coughing, ear pain, a fever and a sore throat.  Sore Throat  Associated symptoms include coughing, ear pain, headaches and shortness of breath.  Fever  Associated symptoms include coughing, ear pain, headaches, a sore throat and wheezing.      Review of Systems  Constitutional: Positive for chills and fever.  HENT: Positive for ear pain, postnasal drip and sore throat.   Respiratory: Positive for cough, shortness of breath and wheezing.   Musculoskeletal: Negative for myalgias.  Neurological: Positive for headaches.  All other systems reviewed and are negative.      Objective:   Physical Exam Vitals reviewed.  Constitutional:      General: She is not in acute distress.    Appearance: She is well-developed.  HENT:     Head: Normocephalic and atraumatic.     Right Ear: External  ear normal.  Eyes:     Pupils: Pupils are equal, round, and reactive to light.  Neck:     Thyroid: No thyromegaly.  Cardiovascular:     Rate and Rhythm: Normal rate and regular rhythm.     Heart sounds: Normal heart sounds. No murmur heard.   Pulmonary:     Effort: Pulmonary effort is normal. No respiratory distress.     Breath sounds: Wheezing present.     Comments: Coarse cough Abdominal:     General: Bowel sounds are normal. There is no distension.     Palpations: Abdomen is soft.     Tenderness: There is no abdominal tenderness.  Musculoskeletal:        General: No tenderness. Normal range of motion.     Cervical back: Normal range of motion and neck supple.  Skin:    General: Skin is warm and dry.  Neurological:     Mental Status: She is alert and oriented to person, place, and time.     Cranial Nerves: No cranial nerve deficit.     Deep Tendon Reflexes: Reflexes are normal and symmetric.  Psychiatric:        Behavior: Behavior normal.        Thought Content: Thought content normal.        Judgment: Judgment normal.       BP (!) 152/91   Pulse 98   Temp 97.9 F (36.6 C) (Temporal)   Ht 5\' 4"  (  1.626 m)   Wt 152 lb (68.9 kg)   SpO2 96%   BMI 26.09 kg/m      Assessment & Plan:  [name] comes in today with chief complaint of Cough (hurts in chest when breathing ), Headache, Sore Throat, Nausea, and Fever (Tuesday was last fever)   Diagnosis and orders addressed:  1. Cough - DG Chest 2 View - Novel Coronavirus, NAA ([facility]) - predniSONE (STERAPRED UNI-PAK 21 TAB) 10 MG (21) TBPK tablet; Use as directed  Dispense: 21 tablet; Refill: 0 - azithromycin (ZITHROMAX) 250 MG tablet; Take 500 mg once, then 250 mg for four days  Dispense: 6 tablet; Refill: 0  2. Acute bacterial bronchitis - Take meds as prescribed - Use a cool mist humidifier  -Use saline nose sprays frequently -Force fluids -For any cough or congestion  Use plain Mucinex- regular  strength or max strength is fine -For fever or aces or pains- take tylenol or ibuprofen. -Throat lozenges if help -RTO if symptoms worsen or do not improve  - predniSONE (STERAPRED UNI-PAK 21 TAB) 10 MG (21) TBPK tablet; Use as directed  Dispense: 21 tablet; Refill: 0 - azithromycin (ZITHROMAX) 250 MG tablet; Take 500 mg once, then 250 mg for four days  Dispense: 6 tablet; Refill: 0   Betty Dun, FNP

## 2020-06-07 LAB — SARS-COV-2, NAA 2 DAY TAT

## 2020-06-07 LAB — NOVEL CORONAVIRUS, NAA: SARS-CoV-2, NAA: NOT DETECTED

## 2020-06-11 ENCOUNTER — Ambulatory Visit: Payer: Medicare HMO | Admitting: Family Medicine

## 2020-06-12 DIAGNOSIS — G894 Chronic pain syndrome: Secondary | ICD-10-CM | POA: Diagnosis not present

## 2020-06-12 DIAGNOSIS — M546 Pain in thoracic spine: Secondary | ICD-10-CM | POA: Diagnosis not present

## 2020-06-12 DIAGNOSIS — M542 Cervicalgia: Secondary | ICD-10-CM | POA: Diagnosis not present

## 2020-06-12 DIAGNOSIS — M4722 Other spondylosis with radiculopathy, cervical region: Secondary | ICD-10-CM | POA: Diagnosis not present

## 2020-06-16 NOTE — Progress Notes (Signed)
Cardiology Office Note  Date: 06/17/2020   ID: [name], DOB 13-Aug-1961, MRN [id number]  PCP:  Betty Norlander, DO  Cardiologist:  Betty Dolly, MD Electrophysiologist:  None   Chief Complaint: Essential hypertension  History of Present Illness: [name] is a 59 y.o. female with a history of HTN, CP, HLD.   Last visit with [name] 07/29/2017: She presented with recurrent CP. She did not have NTG prescribed. Stated stress increased incidence of CP. She continued to smoke.  Patient had been taking alprazolam for GAD but tested pos. for [facility] in March. She was being tapered off alprazolam. She was seeing pain management and taking oxycodone for back pain.   She noted that after being taken off alprazolam her blood pressure was elevated. She called our office 05/16/2020 stating her blood pressure had been elevated for the previous 4 months after alprazolam was stopped.  She was increasing her blood pressure medications on her own with Lisinopril 60 mg and Atenolol 150 mg daily.  Last visit she presented with a blood pressure of 184/100.  Stated she could not seem to get her blood pressure down with current medications.  Stated she knew she needed to stop smoking this will help with the blood pressure.  She was also having some stressful domestic issues.  She stated this made her smoke more.  Stated she had some occasional vague chest pain but no radiation or associated nausea, vomiting, diaphoresis.  Stated the pain could occur at rest or with exertion.  She denied any CVA or TIA-like symptoms, orthostatic symptoms, palpitations or arrhythmias, PND, orthopnea, lower extremity edema, claudication, DVT or PE-like symptoms.  Patient is here for 1 month follow-up.  Patient states she is doing much better.  Blood pressure is well controlled on current medications.  She states her anxiety is better controlled.  She started taking BuSpar which is making her feel much  better and less stressed.  States she is only had to take her hydralazine once or twice for blood pressure elevation.  She states she stopped smoking and is doing well and has no current cravings.  She denies any anginal or exertional symptoms, palpitations or arrhythmias but states her heart rate sometimes increases up to the low 100s occasionally.  She states this may be related to stopping the cigarettes and possible withdrawal.  She denies any PND, orthopnea, orthostatic symptoms, claudication, DVT or PE-like symptoms, or lower extremity edema.   Past Medical History:  Diagnosis Date  . Abdominal pain   . Anxiety   . Arthritis   . Blood in stool   . Breast changes, fibrocystic   . Bruises easily   . Cardiomegaly 2018  . Clotting disorder (HCC)     itp , none since 1976, no current hematologist  . Constipation   . DDD (degenerative disc disease), cervical    with lumbar issues  . Depression   . Generalized headaches   . GERD (gastroesophageal reflux disease)   . Hemorrhoid   . Hyperlipidemia   . Hypertension   . Idiopathic thrombocytopenia ([place])    as child  . Leg swelling    both ankles  . Liver lesion   . Nasal congestion   . Nausea & vomiting   . Neuromuscular disorder ([place])    back  injury  . Neuromuscular disorder ([place])    3 neck surgeries,neck fusion.pin,plates,screws  . Osteoporosis   . Panic attacks    pt also  has germaphobia  . Rectal bleeding   . Seizures ([place]) 1998   stress induced, one time, none since, no seizure meds  . Seizures ([place])    Stated she had 2 weeks ago,it was like a transient,stare.  . Sleep apnea    STOPBANG=4  . Spleen enlarged 1976, none since   r/t idiopathic thrombocytopenia  . Trouble swallowing   . Unintentional weight loss   . Weakness    weakness varies    Past Surgical History:  Procedure Laterality Date  . ABDOMINAL HYSTERECTOMY  1997  . BACK SURGERY    . CERVICAL SPINE SURGERY  2005, 2007, 2010   x3, fusion done,plates  and screws present  . [place]  . CHOLECYSTECTOMY  03/14/2012   Procedure: LAPAROSCOPIC CHOLECYSTECTOMY;  Surgeon: Stark Klein, MD;  Location: [facility];  Service: General;  Laterality: N/A;  . colonscopy and esophagogastrodudononescopy  02-04-12  . LUMBAR DISC SURGERY  11/2014    Current Outpatient Medications  Medication Sig Dispense Refill  . amitriptyline (ELAVIL) 25 MG tablet TAKE 1 TO 2 TABLETS BY MOUTH EVERY DAY AT BEDTIME    . atenolol (TENORMIN) 50 MG tablet TAKE 1 TABLET BY MOUTH ONCE DAILY WITH BREAKFAST NEEDS TO BE SEEN 90 tablet 0  . busPIRone (BUSPAR) 5 MG tablet Take 1 tablet (5 mg total) by mouth 2 (two) times daily for 7 days, THEN 2 tablets (10 mg total) 2 (two) times daily for 7 days, THEN 3 tablets (15 mg total) 2 (two) times daily for 21 days. 135 tablet 0  . citalopram (CELEXA) 40 MG tablet Take 1 tablet (40 mg total) by mouth daily with breakfast. Needs to be seen 90 tablet 0  . hydrALAZINE (APRESOLINE) 50 MG tablet Take 1 tablet (50 mg total) by mouth 2 (two) times daily as needed. 180 tablet 1  . lisinopril-hydrochlorothiazide (ZESTORETIC) 20-25 MG tablet Take 1 tablet by mouth daily. 90 tablet 1  . nitroGLYCERIN (NITROSTAT) 0.4 MG SL tablet Place 1 tablet (0.4 mg total) under the tongue every 5 (five) minutes as needed for chest pain. 25 tablet 3  . ondansetron (ZOFRAN) 4 MG tablet Take 4 mg by mouth every 8 (eight) hours as needed. for nausea    . Oxycodone HCl 10 MG TABS Take 5 mg by mouth 2 (two) times daily.     . rosuvastatin (CRESTOR) 20 MG tablet Take 1 tablet (20 mg total) by mouth daily. 90 tablet 3  . traZODone (DESYREL) 100 MG tablet Take 1.5 tablets (150 mg total) by mouth at bedtime as needed for sleep. 135 tablet 2   No current facility-administered medications for this visit.   Allergies:  Doxycycline, Amoxicillin, and Nsaids   Social History: The patient  reports that she has been smoking cigarettes. She started smoking about 14 years  ago. She has a 12.00 pack-year smoking history. She has never used smokeless tobacco. She reports that she does not drink alcohol and does not use drugs.   Family History: The patient's family history includes Bipolar disorder in her sister; Cancer in her father and paternal grandmother; Colon cancer in her paternal grandmother; Diabetes in her sister; Heart disease in her maternal grandmother, mother, and sister; Hypertension in her mother and sister; Lung cancer in her father.   ROS:  Please see the history of present illness. Otherwise, complete review of systems is positive for none.  All other systems are reviewed and negative.   Physical Exam: VS:  BP 132/72  Pulse 80   Ht 5\' 4"  (1.626 m)   Wt 160 lb 9.6 oz (72.8 kg)   SpO2 96%   BMI 27.57 kg/m , BMI Body mass index is 27.57 kg/m.  Wt Readings from Last 3 Encounters:  06/17/20 160 lb 9.6 oz (72.8 kg)  06/06/20 152 lb (68.9 kg)  05/30/20 154 lb 6.4 oz (70 kg)    General: Patient appears comfortable at rest. Neck: Supple, no elevated JVP or carotid bruits, no thyromegaly. Lungs: Clear to auscultation, nonlabored breathing at rest. Cardiac: Regular rate and rhythm, no S3 or significant systolic murmur, no pericardial rub. Extremities: No pitting edema, distal pulses 2+. Skin: Warm and dry. Musculoskeletal: No kyphosis. Neuropsychiatric: Alert and oriented x3, affect grossly appropriate.  ECG:  An ECG dated 05/20/2020. was personally reviewed today and demonstrated:  Normal sinus rhythm rate of 76, nonspecific ST abnormality.  Recent Labwork: 01/29/2020: Hemoglobin 15.1; Platelets 268 05/30/2020: ALT 19; AST 16; BUN 16; Creatinine, Ser 0.78; Magnesium 1.9; Potassium 4.1; Sodium 136     Component Value Date/Time   CHOL 228 (H) 05/30/2020 1645   TRIG 142 05/30/2020 1645   HDL 54 05/30/2020 1645   CHOLHDL 4.2 05/30/2020 1645   CHOLHDL 4.1 Ratio 01/25/2007 1851   VLDL 32 01/25/2007 1851   LDLCALC 149 (H) 05/30/2020 1645     Other Studies Reviewed Today:   Echocardiogram 07/01/2017 Left ventricle: The cavity size was normal. Wall thickness was increased in a pattern of mild LVH. Systolic function was normal. The estimated ejection fraction was in the range of 60% to 65%. Wall motion was normal; there were no regional wall motion abnormalities. Features are consistent with a pseudonormal left ventricular filling pattern, with concomitant abnormal relaxation and increased filling pressure (grade 2 diastolic dysfunction). Indeterminate filling pressures. - Aortic valve: There was mild regurgitation. - Mitral valve: There was mild regurgitation. - Systemic veins: Dilated IVC with normal respiratory variation. Estimated CVP 8 mmHg.  NM Stress Test 08/06/2016 Study Result    No diagnostic ST segment changes with Lexiscan infusion. Patient did develop chest pain and shortness of breath 10-15 minutes after the completion of the study. This was not associated with any acute ST segment changes by repeat ECG, and blood pressure was stable. Symptoms resolved spontaneously.  Small, mild intensity, partially reversible mid to apical anterior defect suggestive of variable breast attenuation artifact, less likely mild region of ischemia.  This is a low risk study.  Nuclear stress EF: 82%.     Assessment and Plan:  1. Recurrent chest pain Patient states she has had no chest pain since last visit.  Denies any anginal or exertional symptoms.  Continue nitroglycerin sublingual as needed for chest pain  2. Essential hypertension At last visit  lisinopril/HCTZ 20/25 mg was started daily.Hydralazine 50 mg p.o. twice daily was started as needed for sustained blood pressure greater than 140/90.  Today blood pressure much better at 132/72.  Continue lisinopril HCTZ 20/25 and hydralazine 50 mg as needed for blood pressure greater than 140/90.  3. Tobacco abuse Patient states she has stopped smoking.   She was started on BuSpar by her primary care provider which she believes may be helping with tobacco cravings.  States it is also helping with her anxiety  4.  Hyperlipidemia Lipid panel 05/31/2020: Total cholesterol 228, triglycerides 142, HDL 54, LDL 149.  Continue Crestor 20 mg p.o. daily.  Follow-up with PCP for management     Medication Adjustments/Labs and Tests Ordered: Current medicines are  reviewed at length with the patient today.  Concerns regarding medicines are outlined above.   Disposition: Follow-up with Dr. Harl Bowie or APP 6 months  Signed, Levell July, Garza 06/17/2020 1:57 PM    [facility], [place], [place] [address] Phone: [phone]; Fax: [phone]

## 2020-06-17 ENCOUNTER — Ambulatory Visit: Payer: Medicare HMO | Admitting: Family Medicine

## 2020-06-17 ENCOUNTER — Encounter: Payer: Self-pay | Admitting: Family Medicine

## 2020-06-17 VITALS — BP 132/72 | HR 80 | Ht 64.0 in | Wt 160.6 lb

## 2020-06-17 DIAGNOSIS — E782 Mixed hyperlipidemia: Secondary | ICD-10-CM | POA: Diagnosis not present

## 2020-06-17 NOTE — Patient Instructions (Addendum)

## 2020-06-21 ENCOUNTER — Ambulatory Visit (HOSPITAL_COMMUNITY)
Admission: RE | Admit: 2020-06-21 | Discharge: 2020-06-21 | Disposition: A | Discharge status: Home / Self Care | Payer: Medicare HMO | Source: Ambulatory Visit | Attending: Family Medicine | Admitting: Family Medicine

## 2020-06-21 ENCOUNTER — Other Ambulatory Visit: Payer: Self-pay

## 2020-06-21 DIAGNOSIS — Z720 Tobacco use: Secondary | ICD-10-CM | POA: Diagnosis not present

## 2020-06-21 DIAGNOSIS — Z9049 Acquired absence of other specified parts of digestive tract: Secondary | ICD-10-CM | POA: Diagnosis not present

## 2020-06-21 DIAGNOSIS — I700 Atherosclerosis of aorta: Secondary | ICD-10-CM | POA: Diagnosis not present

## 2020-06-21 DIAGNOSIS — R634 Abnormal weight loss: Secondary | ICD-10-CM | POA: Insufficient documentation

## 2020-06-21 DIAGNOSIS — Z981 Arthrodesis status: Secondary | ICD-10-CM | POA: Diagnosis not present

## 2020-06-23 ENCOUNTER — Other Ambulatory Visit: Payer: Self-pay | Admitting: Family

## 2020-06-23 DIAGNOSIS — F411 Generalized anxiety disorder: Secondary | ICD-10-CM

## 2020-06-23 DIAGNOSIS — F331 Major depressive disorder, recurrent, moderate: Secondary | ICD-10-CM

## 2020-06-24 ENCOUNTER — Other Ambulatory Visit: Payer: Self-pay | Admitting: Orthopaedic Surgery

## 2020-06-24 ENCOUNTER — Other Ambulatory Visit: Payer: Self-pay | Admitting: Family Medicine

## 2020-06-24 DIAGNOSIS — H5211 Myopia, right eye: Secondary | ICD-10-CM | POA: Diagnosis not present

## 2020-06-24 DIAGNOSIS — H25813 Combined forms of age-related cataract, bilateral: Secondary | ICD-10-CM | POA: Diagnosis not present

## 2020-06-24 DIAGNOSIS — M546 Pain in thoracic spine: Secondary | ICD-10-CM

## 2020-06-24 DIAGNOSIS — H401131 Primary open-angle glaucoma, bilateral, mild stage: Secondary | ICD-10-CM | POA: Diagnosis not present

## 2020-06-24 DIAGNOSIS — F411 Generalized anxiety disorder: Secondary | ICD-10-CM

## 2020-06-24 MED ORDER — BUSPIRONE HCL 15 MG PO TABS: 15.0000 mg | ORAL_TABLET | Freq: Three times a day (TID) | ORAL | 1 refills | Status: DC

## 2020-07-16 ENCOUNTER — Ambulatory Visit
Admission: RE | Admit: 2020-07-16 | Discharge: 2020-07-16 | Disposition: A | Discharge status: Home / Self Care | Payer: Medicare HMO | Source: Ambulatory Visit | Attending: Orthopaedic Surgery | Admitting: Orthopaedic Surgery

## 2020-07-16 ENCOUNTER — Other Ambulatory Visit: Payer: Self-pay

## 2020-07-16 DIAGNOSIS — G8929 Other chronic pain: Secondary | ICD-10-CM | POA: Diagnosis not present

## 2020-07-16 DIAGNOSIS — M4804 Spinal stenosis, thoracic region: Secondary | ICD-10-CM | POA: Diagnosis not present

## 2020-07-16 DIAGNOSIS — M47814 Spondylosis without myelopathy or radiculopathy, thoracic region: Secondary | ICD-10-CM | POA: Diagnosis not present

## 2020-07-16 DIAGNOSIS — M546 Pain in thoracic spine: Secondary | ICD-10-CM

## 2020-07-16 DIAGNOSIS — M5124 Other intervertebral disc displacement, thoracic region: Secondary | ICD-10-CM | POA: Diagnosis not present

## 2020-07-24 ENCOUNTER — Other Ambulatory Visit: Payer: Self-pay | Admitting: Family Medicine

## 2020-07-24 ENCOUNTER — Other Ambulatory Visit: Payer: Self-pay | Admitting: Family

## 2020-07-24 DIAGNOSIS — M546 Pain in thoracic spine: Secondary | ICD-10-CM | POA: Diagnosis not present

## 2020-07-24 DIAGNOSIS — M542 Cervicalgia: Secondary | ICD-10-CM | POA: Diagnosis not present

## 2020-07-24 DIAGNOSIS — M4722 Other spondylosis with radiculopathy, cervical region: Secondary | ICD-10-CM | POA: Diagnosis not present

## 2020-07-24 DIAGNOSIS — I10 Essential (primary) hypertension: Secondary | ICD-10-CM

## 2020-07-24 DIAGNOSIS — G894 Chronic pain syndrome: Secondary | ICD-10-CM | POA: Diagnosis not present

## 2020-07-24 DIAGNOSIS — F411 Generalized anxiety disorder: Secondary | ICD-10-CM

## 2020-07-29 ENCOUNTER — Ambulatory Visit: Payer: Medicare HMO | Admitting: Family Medicine

## 2020-08-16 ENCOUNTER — Encounter: Payer: Self-pay | Admitting: Gastroenterology

## 2020-08-16 ENCOUNTER — Ambulatory Visit: Payer: Medicare HMO | Admitting: Gastroenterology

## 2020-08-16 VITALS — BP 112/72 | HR 72 | Ht 64.0 in | Wt 174.0 lb

## 2020-08-16 DIAGNOSIS — R1011 Right upper quadrant pain: Secondary | ICD-10-CM | POA: Diagnosis not present

## 2020-08-16 MED ORDER — FAMOTIDINE 20 MG PO TABS
20.0000 mg | ORAL_TABLET | Freq: Every day | ORAL | Status: DC | PRN
Start: 2020-08-16 — End: 2022-12-05

## 2020-08-16 NOTE — Patient Instructions (Addendum)
If you are age 61 or older, your body mass index should be between 23-30. Your Body mass index is 29.87 kg/m. If this is out of the aforementioned range listed, please consider follow up with your Primary Care Provider.  If you are age 98 or younger, your body mass index should be between 19-25. Your Body mass index is 29.87 kg/m. If this is out of the aformentioned range listed, please consider follow up with your Primary Care Provider.   Please purchase the following medications over the counter and take as directed:  START: famotidine 20mg  take one tablet daily as needed for indigestion.  You have been scheduled for an abdominal ultrasound at [facility] (1st floor of hospital) on 08-22-20 at 11:00am. Please arrive 15 minutes prior to your appointment for registration. Make certain not to have anything to eat or drink 6 to 8 hours prior to your appointment. Should you need to reschedule your appointment, please contact radiology at [phone]. This test typically takes about 30 minutes to perform.  Due to recent changes in healthcare laws, you may see the results of your imaging and laboratory studies on MyChart before your provider has had a chance to review them.  We understand that in some cases there may be results that are confusing or concerning to you. Not all laboratory results come back in the same time frame and the provider may be waiting for multiple results in order to interpret others.  Please give [place] 48 hours in order for your provider to thoroughly review all the results before contacting the office for clarification of your results.   Thank you for entrusting me with your care and choosing [facility].  Dr Ardis Hughs

## 2020-08-16 NOTE — Progress Notes (Signed)
Review of pertinent gastrointestinal problems:  1. Routine risk for colon cancer. Colonoscopy March 2013 found mild diverticulosis only. Recommended 10 year recall colonoscopy  2. intermittent abdominal pains, felt possibly due to 4-5 cm cystic lesion in liver. She underwent wedge resection of liver 03/2012 and these were found to be hamartomas. Her gallbladder was also removed at the same time. Pains improved after the surgery. 3. EGD for mild dysphasia, dyspepsia March 2013 showed mild gastritis that was H. pylori negative. 4. EGD 11/2012 for weight loss, post prandial discomfort showed mild gastritis, biopsies showed no H. Pylori. 10/2012 CT scan Of abdomen and pelvis with IV and oral contrast last month was normal CBC, complete metabolic profile last month were normal. GES afterwards showed slow gastric emptying (felt possibly due to narcotic pain meds). Was advised to cut down narcotics.  EGD February 2017 for right-sided abdominal pains thickened distal esophagus showed a normal upper GI tract.  I recommended that she continue taking Reglan 10 mg pills 1 pill at bedtime every night but that seemed to be helping some of her previous symptoms.   HPI: This is a pleasant 59 year old woman who was last here in our office about 3 years ago.  She is here today to discuss indigestion and weight loss  Her weight December 2016 was 195 pounds  I last saw her December 2016 in the office.  She was having nausea and vomiting.  She was on quite a lot of narcotic pain medicines daily.  Her weight was up about 30 pounds since the previous visit in our office.  Her weight November 2018 was 177 pounds in our office.  She saw Amy.  Amy set her up with an MRI.  Her weight today is 174 pounds, she claims to have lost 60 pounds in the past couple years.  She wants a medicine that is less expensive than Pepcid.  She tells me this caused her $50 a month.  She takes it for intermittent indigestion type symptoms  and it is very helpful.  She has no overt bleeding, no dysphagia.  She does have intermittent positional right-sided abdominal pains.  She is very worried about the possibility of having another tumor in her liver   ROS: complete GI ROS as described in HPI, all other review negative.  Constitutional:  No unintentional weight loss   Past Medical History:  Diagnosis Date  . Abdominal pain   . Anxiety   . Arthritis   . Blood in stool   . Breast changes, fibrocystic   . Bruises easily   . Cardiomegaly 2018  . Clotting disorder (HCC)     itp , none since 1976, no current hematologist  . Constipation   . DDD (degenerative disc disease), cervical    with lumbar issues  . Depression   . Generalized headaches   . GERD (gastroesophageal reflux disease)   . Hemorrhoid   . Hyperlipidemia   . Hypertension   . Idiopathic thrombocytopenia ([place])    as child  . Leg swelling    both ankles  . Liver lesion   . Nasal congestion   . Nausea & vomiting   . Neuromuscular disorder ([place])    back  injury  . Neuromuscular disorder ([place])    3 neck surgeries,neck fusion.pin,plates,screws  . Osteoporosis   . Panic attacks    pt also  has germaphobia  . Rectal bleeding   . Seizures ([place]) 1998   stress induced, one time, none since, no seizure  meds  . Seizures ([place])    Stated she had 2 weeks ago,it was like a transient,stare.  . Sleep apnea    STOPBANG=4  . Spleen enlarged 1976, none since   r/t idiopathic thrombocytopenia  . Trouble swallowing   . Unintentional weight loss   . Weakness    weakness varies    Past Surgical History:  Procedure Laterality Date  . ABDOMINAL HYSTERECTOMY  1997  . BACK SURGERY    . CERVICAL SPINE SURGERY  2005, 2007, 2010   x3, fusion done,plates and screws present  . [place]  . CHOLECYSTECTOMY  03/14/2012   Procedure: LAPAROSCOPIC CHOLECYSTECTOMY;  Surgeon: Stark Klein, MD;  Location: [facility];  Service: General;  Laterality: N/A;  .  colonscopy and esophagogastrodudononescopy  02-04-12  . LUMBAR DISC SURGERY  11/2014    Current Outpatient Medications  Medication Sig Dispense Refill  . amitriptyline (ELAVIL) 25 MG tablet TAKE 1 TO 2 TABLETS BY MOUTH EVERY DAY AT BEDTIME    . atenolol (TENORMIN) 50 MG tablet TAKE 1 TABLET BY MOUTH ONCE DAILY WITH BREAKFAST NEEDS TO BE SEEN 90 tablet 0  . busPIRone (BUSPAR) 15 MG tablet Take 1 tablet (15 mg total) by mouth 3 (three) times daily. 270 tablet 1  . citalopram (CELEXA) 40 MG tablet Take 1 tablet (40 mg total) by mouth daily with breakfast. 90 tablet 0  . hydrALAZINE (APRESOLINE) 50 MG tablet Take 1 tablet (50 mg total) by mouth 2 (two) times daily as needed. 180 tablet 1  . lisinopril-hydrochlorothiazide (ZESTORETIC) 20-25 MG tablet Take 1 tablet by mouth daily. 90 tablet 1  . nitroGLYCERIN (NITROSTAT) 0.4 MG SL tablet Place 1 tablet (0.4 mg total) under the tongue every 5 (five) minutes as needed for chest pain. 25 tablet 3  . ondansetron (ZOFRAN) 4 MG tablet Take 4 mg by mouth every 8 (eight) hours as needed. for nausea    . Oxycodone HCl 10 MG TABS Take 5 mg by mouth 2 (two) times daily.     . rosuvastatin (CRESTOR) 20 MG tablet Take 1 tablet (20 mg total) by mouth daily. 90 tablet 3  . traZODone (DESYREL) 100 MG tablet Take 1.5 tablets (150 mg total) by mouth at bedtime as needed for sleep. 135 tablet 2   No current facility-administered medications for this visit.    Allergies as of 08/16/2020 - Review Complete 08/16/2020  Allergen Reaction Noted  . Doxycycline  06/06/2020  . Amoxicillin Diarrhea 08/26/2017  . Nsaids Nausea Only 01/06/2012    Family History  Problem Relation Age of Onset  . Hypertension Mother   . Heart disease Mother   . Lung cancer Father   . Cancer Father        lung  . Colon cancer Paternal Grandmother   . Cancer Paternal Grandmother        colon  . Heart disease Maternal Grandmother   . Diabetes Sister   . Bipolar disorder Sister   .  Heart disease Sister        Congestive Heart Failure  . Hypertension Sister     Social History   Socioeconomic History  . Marital status: Married    Spouse name: Legrand Como  . Number of children: 3  . Years of education: 62  . Highest education level: Associate degree: academic program  Occupational History  . Occupation: Diabled    Employer: UNEMPLOYED  Tobacco Use  . Smoking status: Current Every Day Smoker    Packs/day:  1.00    Years: 12.00    Pack years: 12.00    Types: Cigarettes    Start date: 12/20/2005  . Smokeless tobacco: Never Used  Vaping Use  . Vaping Use: Former  . Quit date: 09/07/2016  . Devices: only used for about a month  Substance and Sexual Activity  . Alcohol use: No    Alcohol/week: 0.0 standard drinks  . Drug use: No  . Sexual activity: Yes  Other Topics Concern  . Not on file  Social History Narrative   She lives with her boyfriend.  She used to smoke, quit May 2013, she is not drinking. She has 3 children   Social Determinants of Health   Financial Resource Strain: High Risk  . Difficulty of Paying Living Expenses: Hard  Food Insecurity: Food Insecurity Present  . Worried About Charity fundraiser in the Last Year: Sometimes true  . Ran Out of Food in the Last Year: Never true  Transportation Needs: No Transportation Needs  . Lack of Transportation (Medical): No  . Lack of Transportation (Non-Medical): No  Physical Activity: Sufficiently Active  . Days of Exercise per Week: 7 days  . Minutes of Exercise per Session: 60 min  Stress: No Stress Concern Present  . Feeling of Stress : Not at all  Social Connections: Socially Integrated  . Frequency of Communication with Friends and Family: More than three times a week  . Frequency of Social Gatherings with Friends and Family: More than three times a week  . Attends Religious Services: More than 4 times per year  . Active Member of Clubs or Organizations: Yes  . Attends Archivist  Meetings: More than 4 times per year  . Marital Status: Married  Human resources officer Violence: Not At Risk  . Fear of Current or Ex-Partner: No  . Emotionally Abused: No  . Physically Abused: No  . Sexually Abused: No     Physical Exam: BP 112/72   Pulse 72   Ht 5\' 4"  (1.626 m)   Wt 174 lb (78.9 kg)   BMI 29.87 kg/m  Constitutional: generally well-appearing Psychiatric: alert and oriented x3 Abdomen: soft, nontender, nondistended, no obvious ascites, no peritoneal signs, normal bowel sounds No peripheral edema noted in lower extremities  Assessment and plan: 59 y.o. female with dyspepsia, indigestion, history of liver hamartoma, perceived loss of weight  First her weight is down 3 pounds on the same scale here in the past 2 or 3 years.  She has clearly not lost 60 pounds.  Second we had a discussion about Pepcid, the generic is famotidine.  It is helpful for her mild intermittent dyspepsia and indigestion.  She says it is very expensive however I told her to look for the generic type, 20 mg pills.  I know that it is possible to get these at about $0.10 per pill over-the-counter through [employer] or at [employer].  She has intermittent right-sided positional pains.  I suspect these are related to scar tissue from her liver surgery.  She is very worried about the possibility of having an underlying new hamartoma and so I am arranging an ultrasound of her liver.  Please see the "Patient Instructions" section for addition details about the plan.  Owens Loffler, MD [facility] 08/16/2020, 11:04 AM   Total time on date of encounter was 30 minutes (this included time spent preparing to see the patient reviewing records; obtaining and/or reviewing separately obtained history; performing a medically appropriate exam  and/or evaluation; counseling and educating the patient and family if present; ordering medications, tests or procedures if applicable; and documenting clinical  information in the health record).

## 2020-08-22 ENCOUNTER — Ambulatory Visit (HOSPITAL_COMMUNITY)
Admission: RE | Admit: 2020-08-22 | Discharge: 2020-08-22 | Disposition: A | Discharge status: Home / Self Care | Payer: Medicare HMO | Source: Ambulatory Visit | Attending: Gastroenterology | Admitting: Gastroenterology

## 2020-08-22 ENCOUNTER — Other Ambulatory Visit: Payer: Self-pay

## 2020-08-22 DIAGNOSIS — G894 Chronic pain syndrome: Secondary | ICD-10-CM | POA: Diagnosis not present

## 2020-08-22 DIAGNOSIS — R1011 Right upper quadrant pain: Secondary | ICD-10-CM | POA: Insufficient documentation

## 2020-08-22 DIAGNOSIS — Z9049 Acquired absence of other specified parts of digestive tract: Secondary | ICD-10-CM | POA: Diagnosis not present

## 2020-08-22 DIAGNOSIS — Z79891 Long term (current) use of opiate analgesic: Secondary | ICD-10-CM | POA: Diagnosis not present

## 2020-08-22 DIAGNOSIS — M4722 Other spondylosis with radiculopathy, cervical region: Secondary | ICD-10-CM | POA: Diagnosis not present

## 2020-08-22 DIAGNOSIS — K769 Liver disease, unspecified: Secondary | ICD-10-CM | POA: Diagnosis not present

## 2020-08-22 DIAGNOSIS — M4322 Fusion of spine, cervical region: Secondary | ICD-10-CM | POA: Diagnosis not present

## 2020-08-22 DIAGNOSIS — M542 Cervicalgia: Secondary | ICD-10-CM | POA: Diagnosis not present

## 2020-08-22 DIAGNOSIS — Z79899 Other long term (current) drug therapy: Secondary | ICD-10-CM | POA: Diagnosis not present

## 2020-08-27 ENCOUNTER — Other Ambulatory Visit: Payer: Self-pay | Admitting: Family Medicine

## 2020-08-27 DIAGNOSIS — F411 Generalized anxiety disorder: Secondary | ICD-10-CM

## 2020-09-06 ENCOUNTER — Encounter: Payer: Self-pay | Admitting: Family Medicine

## 2020-09-06 ENCOUNTER — Other Ambulatory Visit: Payer: Self-pay

## 2020-09-06 ENCOUNTER — Ambulatory Visit (INDEPENDENT_AMBULATORY_CARE_PROVIDER_SITE_OTHER): Payer: Medicare HMO | Admitting: Family Medicine

## 2020-09-06 VITALS — BP 123/75 | HR 76 | Temp 97.7°F | Ht 64.0 in | Wt 174.6 lb

## 2020-09-06 DIAGNOSIS — H401131 Primary open-angle glaucoma, bilateral, mild stage: Secondary | ICD-10-CM | POA: Diagnosis not present

## 2020-09-06 DIAGNOSIS — H2513 Age-related nuclear cataract, bilateral: Secondary | ICD-10-CM | POA: Diagnosis not present

## 2020-09-06 DIAGNOSIS — F331 Major depressive disorder, recurrent, moderate: Secondary | ICD-10-CM | POA: Diagnosis not present

## 2020-09-06 DIAGNOSIS — R69 Illness, unspecified: Secondary | ICD-10-CM | POA: Diagnosis not present

## 2020-09-06 DIAGNOSIS — F411 Generalized anxiety disorder: Secondary | ICD-10-CM

## 2020-09-06 DIAGNOSIS — I10 Essential (primary) hypertension: Secondary | ICD-10-CM

## 2020-09-06 DIAGNOSIS — Z23 Encounter for immunization: Secondary | ICD-10-CM

## 2020-09-06 MED ORDER — BUSPIRONE HCL 15 MG PO TABS: 15.0000 mg | ORAL_TABLET | Freq: Three times a day (TID) | ORAL | 3 refills | Status: DC

## 2020-09-06 MED ORDER — ATENOLOL 50 MG PO TABS: 50.0000 mg | ORAL_TABLET | Freq: Every day | ORAL | 3 refills | Status: DC

## 2020-09-06 MED ORDER — CITALOPRAM HYDROBROMIDE 40 MG PO TABS: 40.0000 mg | ORAL_TABLET | Freq: Every day | ORAL | 3 refills | Status: DC

## 2020-09-06 NOTE — Progress Notes (Signed)
Subjective: CC: f/u HTN, HLD, GAD PCP: [name], DO [id number] [name] is Betty 59 y.o. female presenting to clinic today for:  1. HTN, HLD Followed by cardiology.  Treated with crestor, zestoretic and hydralazine (prn).  No chest pain, shortness of breath.  She is compliant with her medications.  2. Generalized anxiety disorder/depression History: hospitalized '98 for pseudoseizures.  She was started on benzo at that time. She was previously on Xanax as well but she failed Betty drug screen and therefore this was tapered off.   Patient is treated with Celexa, amitriptyline (neuropathy/ radiculopathy) and trazodone (increased to 150mg  last visit).  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  She has had no signs or symptoms suggestive of serotonin syndrome.  She is pleased with how things are going  ROS: Per HPI  Allergies  Allergen Reactions   Doxycycline    Amoxicillin Diarrhea   Nsaids Nausea Only    Abdominal pain   Past Medical History:  Diagnosis Date   Abdominal pain    Anxiety    Arthritis    Blood in stool    Breast changes, fibrocystic    Bruises easily    Cardiomegaly 2018   Clotting disorder (HCC)     itp , none since 1976, no current hematologist   Constipation    DDD (degenerative disc disease), cervical    with lumbar issues   Depression    Generalized headaches    GERD (gastroesophageal reflux disease)    Hemorrhoid    Hyperlipidemia    Hypertension    Idiopathic thrombocytopenia (HCC)    as child   Leg swelling    both ankles   Liver lesion    Nasal congestion    Nausea & vomiting    Neuromuscular disorder (HCC)    back  injury   Neuromuscular disorder ([place])    3 neck surgeries,neck fusion.pin,plates,screws   Osteoporosis    Panic attacks    pt also  has germaphobia   Rectal bleeding    Seizures ([place])  1998   stress induced, one time, none since, no seizure meds   Seizures ([place])    Stated she had 2 weeks ago,it was like Betty transient,stare.   Sleep apnea    STOPBANG=4   Spleen enlarged 1976, none since   r/t idiopathic thrombocytopenia   Trouble swallowing    Unintentional weight loss    Weakness    weakness varies    Current Outpatient Medications:    amitriptyline (ELAVIL) 25 MG tablet, TAKE 1 TO 2 TABLETS BY MOUTH EVERY DAY AT BEDTIME, Disp: , Rfl:    atenolol (TENORMIN) 50 MG tablet, TAKE 1 TABLET BY MOUTH ONCE DAILY WITH BREAKFAST NEEDS TO BE SEEN, Disp: 90 tablet, Rfl: 0   busPIRone (BUSPAR) 15 MG tablet, Take 1 tablet (15 mg total) by mouth 3 (three) times daily., Disp: 270 tablet, Rfl: 1   citalopram (CELEXA) 40 MG tablet, Take 1 tablet (40 mg total) by mouth daily with breakfast., Disp: 90 tablet, Rfl: 0   famotidine (PEPCID) 20 MG tablet, Take 1 tablet (20 mg total) by mouth daily as needed for heartburn or indigestion., Disp: 30 tablet, Rfl:    hydrALAZINE (APRESOLINE) 50 MG tablet, Take 1 tablet (50 mg total) by mouth 2 (two) times daily as needed., Disp: 180 tablet, Rfl: 1   lisinopril-hydrochlorothiazide (ZESTORETIC) 20-25 MG tablet,  Take 1 tablet by mouth daily., Disp: 90 tablet, Rfl: 1   nitroGLYCERIN (NITROSTAT) 0.4 MG SL tablet, Place 1 tablet (0.4 mg total) under the tongue every 5 (five) minutes as needed for chest pain., Disp: 25 tablet, Rfl: 3   ondansetron (ZOFRAN) 4 MG tablet, Take 4 mg by mouth every 8 (eight) hours as needed. for nausea, Disp: , Rfl:    Oxycodone HCl 10 MG TABS, Take 5 mg by mouth 2 (two) times daily. , Disp: , Rfl:    rosuvastatin (CRESTOR) 20 MG tablet, Take 1 tablet (20 mg total) by mouth daily., Disp: 90 tablet, Rfl: 3   traZODone (DESYREL) 100 MG tablet, Take 1.5 tablets (150 mg total) by mouth at bedtime as needed for sleep., Disp: 135 tablet, Rfl: 2 Social History   Socioeconomic History   Marital status: Married     Spouse name: Legrand Como   Number of children: 3   Years of education: 14   Highest education level: Associate degree: academic program  Occupational History   Occupation: Corporate investment banker: UNEMPLOYED  Tobacco Use   Smoking status: Current Every Day Smoker    Packs/day: 1.00    Years: 12.00    Pack years: 12.00    Types: Cigarettes    Start date: 12/20/2005   Smokeless tobacco: Never Used  Vaping Use   Vaping Use: Former   Quit date: 09/07/2016   Devices: only used for about Betty month  Substance and Sexual Activity   Alcohol use: No    Alcohol/week: 0.0 standard drinks   Drug use: No   Sexual activity: Yes  Other Topics Concern   Not on file  Social History Narrative   She lives with her boyfriend.  She used to smoke, quit May 2013, she is not drinking. She has 3 children   Social Determinants of Radio broadcast assistant Strain: High Risk   Difficulty of Paying Living Expenses: Hard  Food Insecurity: Food Insecurity Present   Worried About Charity fundraiser in the Last Year: Sometimes true   Ran Out of Food in the Last Year: Never true  Transportation Needs: No Transportation Needs   Lack of Transportation (Medical): No   Lack of Transportation (Non-Medical): No  Physical Activity: Sufficiently Active   Days of Exercise per Week: 7 days   Minutes of Exercise per Session: 60 min  Stress: No Stress Concern Present   Feeling of Stress : Not at all  Social Connections: Socially Integrated   Frequency of Communication with Friends and Family: More than three times Betty week   Frequency of Social Gatherings with Friends and Family: More than three times Betty week   Attends Religious Services: More than 4 times per year   Active Member of [employer] or Organizations: Yes   Attends Music therapist: More than 4 times per year   Marital Status: Married  Human resources officer Violence: Not At Risk   Fear of Current or Ex-Partner: No   Emotionally  Abused: No   Physically Abused: No   Sexually Abused: No   Family History  Problem Relation Age of Onset   Hypertension Mother    Heart disease Mother    Lung cancer Father    Cancer Father        lung   Colon cancer Paternal Grandmother    Cancer Paternal Grandmother        colon   Heart disease Maternal Grandmother    Diabetes Sister  Bipolar disorder Sister    Heart disease Sister        Congestive Heart Failure   Hypertension Sister     Objective: Office vital signs reviewed. BP 123/75    Pulse 76    Temp 97.7 F (36.5 C)    Ht 5\' 4"  (1.626 m)    Wt 174 lb 9.6 oz (79.2 kg)    SpO2 97%    BMI 29.97 kg/m   Physical Examination:  General: Awake, alert, well-appearing, No acute distress HEENT: Normal; sclera white Cardio: regular rate and rhythm, S1S2 heard, no murmurs appreciated Pulm: Globally decreased breath sounds but no wheezes, rhonchi or rails Psych: mood stable, speech is normal.  Good eye contact.  Pleasant and interactive Depression screen [facility] 2/9 09/06/2020 05/30/2020 01/29/2020  Decreased Interest 0 0 0  Down, Depressed, Hopeless 0 0 0  PHQ - 2 Score 0 0 0  Altered sleeping - - 0  Tired, decreased energy - - 1  Change in appetite - - 1  Feeling bad or failure about yourself  - - 0  Trouble concentrating - - 1  Moving slowly or fidgety/restless - - 0  Suicidal thoughts - - 0  PHQ-9 Score - - 3  Difficult doing work/chores - - Not difficult at all  Some recent data might be hidden   GAD 7 : Generalized Anxiety Score 09/06/2020 05/30/2020 01/29/2020  Nervous, Anxious, on Edge 0 3 1  Control/stop worrying 0 3 1  Worry too much - different things 0 3 1  Trouble relaxing 0 3 1  Restless 0 3 0  Easily annoyed or irritable 0 3 0  Afraid - awful might happen 0 0 0  Total GAD 7 Score 0 18 4  Anxiety Difficulty - - Not difficult at all   Assessment/ Plan: 59 y.o. female   1. GAD (generalized anxiety disorder) Under much better control with  the addition of buspirone.  She is stable and I renewed her medications for 1 year.  She may follow-up in 1 year for annual physical exam with fasting labs - busPIRone (BUSPAR) 15 MG tablet; Take 1 tablet (15 mg total) by mouth 3 (three) times daily.  Dispense: 270 tablet; Refill: 3 - citalopram (CELEXA) 40 MG tablet; Take 1 tablet (40 mg total) by mouth daily with breakfast.  Dispense: 90 tablet; Refill: 3  2. Essential hypertension Controlled - atenolol (TENORMIN) 50 MG tablet; Take 1 tablet (50 mg total) by mouth daily.  Dispense: 90 tablet; Refill: 3  3. Moderate episode of recurrent major depressive disorder (HCC) Controlled - citalopram (CELEXA) 40 MG tablet; Take 1 tablet (40 mg total) by mouth daily with breakfast.  Dispense: 90 tablet; Refill: 3  4. Need for immunization against influenza Administered - Flu Vaccine QUAD 36+ mos IM   No orders of the defined types were placed in this encounter.  No orders of the defined types were placed in this encounter.    [name], DO [place] [phone]

## 2020-09-18 ENCOUNTER — Ambulatory Visit (INDEPENDENT_AMBULATORY_CARE_PROVIDER_SITE_OTHER): Payer: Medicare HMO

## 2020-09-18 DIAGNOSIS — Z0000 Encounter for general adult medical examination without abnormal findings: Secondary | ICD-10-CM | POA: Diagnosis not present

## 2020-09-18 NOTE — Patient Instructions (Signed)
  [name] , Thank you for taking time to come for your Medicare Wellness Visit. I appreciate your ongoing commitment to your health goals. Please review the following plan we discussed and let me know if I can assist you in the future.   These are the goals we discussed: Goals    . DIET - INCREASE WATER INTAKE     Try to drink 6-8 glasses of water daily    . DIET - REDUCE SUGAR INTAKE     Patient states she would like to 20 lbs.        This is a list of the screening recommended for you and due dates:  Health Maintenance  Topic Date Due  . Pap Smear  07/03/2019  . Mammogram  11/04/2020  . Colon Cancer Screening  02/03/2022  . Tetanus Vaccine  02/05/2028  . Flu Shot  Completed  . COVID-19 Vaccine  Completed  .  Hepatitis C: One time screening is recommended by Center for Disease Control  (CDC) for  adults born from [birth year] through 1967.   Completed  . HIV Screening  Completed

## 2020-09-18 NOTE — Progress Notes (Signed)
MEDICARE ANNUAL WELLNESS VISIT  09/18/2020  Telephone Visit Disclaimer This Medicare AWV was conducted by telephone due to national recommendations for restrictions regarding the COVID-19 Pandemic (e.g. social distancing).  I verified, using two identifiers, that I am speaking with [name] or their authorized healthcare agent. I discussed the limitations, risks, security, and privacy concerns of performing an evaluation and management service by telephone and the potential availability of an in-person appointment in the future. The patient expressed understanding and agreed to proceed.  Location of Patient: Home Location of Provider (nurse):  [facility]  Subjective:    [name] is a 59 y.o. female patient of Betty Norlander, DO who had a Medicare Annual Wellness Visit today via telephone. [name] resides in nearby [place] with her husband. They do not have any children together but [name] has 3 children with her previous husband. She is disabled due to multiple back issues. Before she became disabled she worked in [employer]. She enjoys painting and sewing in her free time if she feels like it. She is unable to exercise due to her chronic back and neck pain. She feels her health is worse than it was this time last year.   Patient Care Team: Betty Norlander, DO as PCP - General (Family Medicine) Harl Bowie Alphonse Guild, MD as PCP - Cardiology (Cardiology) Starling Manns, MD (Orthopedic Surgery) Arnoldo Lenis, MD as Consulting Physician (Cardiology)  Advanced Directives 09/18/2020 09/08/2019 09/06/2018 08/28/2017 06/30/2017 06/30/2017 07/14/2016  Does Patient Have a Medical Advance Directive? No No No No No No No  Would patient like information on creating a medical advance directive? No - Patient declined No - Patient declined Yes (MAU/Ambulatory/Procedural Areas - Information given) - No - Patient declined - No - patient declined  information  Pre-existing out of facility DNR order (yellow form or pink MOST form) - - - - - - -    Hospital Utilization Over the Past 12 Months: # of hospitalizations or ER visits: 0 # of surgeries: 0  Review of Systems    Patient reports that her overall health is worse compared to last year.  History obtained from chart review  Patient Reported Readings (BP, Pulse, CBG, Weight, etc) none  Pain Assessment Pain : 0-10 Pain Score: 5  Pain Type: Chronic pain Pain Location: Back Pain Onset: More than a month ago Pain Frequency: Intermittent Pain Relieving Factors: Pain Medication  Pain Relieving Factors: Pain Medication  Current Medications & Allergies (verified) Allergies as of 09/18/2020      Reactions   Doxycycline    Amoxicillin Diarrhea   Nsaids Nausea Only   Abdominal pain      Medication List       Accurate as of September 18, 2020  1:48 PM. If you have any questions, ask your nurse or doctor.        amitriptyline 25 MG tablet Commonly known as: ELAVIL TAKE 1 TO 2 TABLETS BY MOUTH EVERY DAY AT BEDTIME   atenolol 50 MG tablet Commonly known as: TENORMIN Take 1 tablet (50 mg total) by mouth daily.   busPIRone 15 MG tablet Commonly known as: BUSPAR Take 1 tablet (15 mg total) by mouth 3 (three) times daily.   citalopram 40 MG tablet Commonly known as: CELEXA Take 1 tablet (40 mg total) by mouth daily with breakfast.   famotidine 20 MG tablet Commonly known as: PEPCID Take 1 tablet (20 mg total) by mouth daily as  needed for heartburn or indigestion.   hydrALAZINE 50 MG tablet Commonly known as: APRESOLINE Take 1 tablet (50 mg total) by mouth 2 (two) times daily as needed.   lisinopril-hydrochlorothiazide 20-25 MG tablet Commonly known as: Zestoretic Take 1 tablet by mouth daily.   nitroGLYCERIN 0.4 MG SL tablet Commonly known as: NITROSTAT Place 1 tablet (0.4 mg total) under the tongue every 5 (five) minutes as needed for chest pain.     ondansetron 4 MG tablet Commonly known as: ZOFRAN Take 4 mg by mouth every 8 (eight) hours as needed. for nausea   Oxycodone HCl 10 MG Tabs Take 5 mg by mouth 2 (two) times daily.   rosuvastatin 20 MG tablet Commonly known as: Crestor Take 1 tablet (20 mg total) by mouth daily.   traZODone 100 MG tablet Commonly known as: DESYREL Take 1.5 tablets (150 mg total) by mouth at bedtime as needed for sleep.       History (reviewed): Past Medical History:  Diagnosis Date  . Abdominal pain   . Anxiety   . Arthritis   . Blood in stool   . Breast changes, fibrocystic   . Bruises easily   . Cardiomegaly 2018  . Clotting disorder (HCC)     itp , none since 1976, no current hematologist  . Constipation   . DDD (degenerative disc disease), cervical    with lumbar issues  . Depression   . Generalized headaches   . GERD (gastroesophageal reflux disease)   . Hemorrhoid   . Hyperlipidemia   . Hypertension   . Idiopathic thrombocytopenia ([place])    as child  . Leg swelling    both ankles  . Liver lesion   . Nasal congestion   . Nausea & vomiting   . Neuromuscular disorder ([place])    back  injury  . Neuromuscular disorder ([place])    3 neck surgeries,neck fusion.pin,plates,screws  . Osteoporosis   . Panic attacks    pt also  has germaphobia  . Rectal bleeding   . Seizures ([place]) 1998   stress induced, one time, none since, no seizure meds  . Seizures ([place])    Stated she had 2 weeks ago,it was like a transient,stare.  . Sleep apnea    STOPBANG=4  . Spleen enlarged 1976, none since   r/t idiopathic thrombocytopenia  . Trouble swallowing   . Unintentional weight loss   . Weakness    weakness varies   Past Surgical History:  Procedure Laterality Date  . ABDOMINAL HYSTERECTOMY  1997  . BACK SURGERY    . CERVICAL SPINE SURGERY  2005, 2007, 2010   x3, fusion done,plates and screws present  . [place]  . CHOLECYSTECTOMY  03/14/2012   Procedure: LAPAROSCOPIC  CHOLECYSTECTOMY;  Surgeon: Stark Klein, MD;  Location: [facility];  Service: General;  Laterality: N/A;  . colonscopy and esophagogastrodudononescopy  02-04-12  . LUMBAR DISC SURGERY  11/2014   Family History  Problem Relation Age of Onset  . Hypertension Mother   . Heart disease Mother   . Lung cancer Father   . Cancer Father        lung  . Colon cancer Paternal Grandmother   . Cancer Paternal Grandmother        colon  . Heart disease Maternal Grandmother   . Diabetes Sister   . Bipolar disorder Sister   . Heart disease Sister        Congestive Heart Failure  . Hypertension Sister  Social History   Socioeconomic History  . Marital status: Married    Spouse name: Legrand Como  . Number of children: 3  . Years of education: 54  . Highest education level: Associate degree: academic program  Occupational History  . Occupation: Diabled    Employer: UNEMPLOYED  Tobacco Use  . Smoking status: Current Every Day Smoker    Packs/day: 1.00    Years: 12.00    Pack years: 12.00    Types: Cigarettes    Start date: 12/20/2005  . Smokeless tobacco: Never Used  Vaping Use  . Vaping Use: Former  . Quit date: 09/07/2016  . Devices: only used for about a month  Substance and Sexual Activity  . Alcohol use: No    Alcohol/week: 0.0 standard drinks  . Drug use: No  . Sexual activity: Yes  Other Topics Concern  . Not on file  Social History Narrative   She lives with her boyfriend.  She used to smoke, quit May 2013, she is not drinking. She has 3 children   Social Determinants of Radio broadcast assistant Strain:   . Difficulty of Paying Living Expenses: Not on file  Food Insecurity:   . Worried About Charity fundraiser in the Last Year: Not on file  . Ran Out of Food in the Last Year: Not on file  Transportation Needs:   . Lack of Transportation (Medical): Not on file  . Lack of Transportation (Non-Medical): Not on file  Physical Activity:   . Days of Exercise per Week: Not on  file  . Minutes of Exercise per Session: Not on file  Stress:   . Feeling of Stress : Not on file  Social Connections:   . Frequency of Communication with Friends and Family: Not on file  . Frequency of Social Gatherings with Friends and Family: Not on file  . Attends Religious Services: Not on file  . Active Member of Clubs or Organizations: Not on file  . Attends Archivist Meetings: Not on file  . Marital Status: Not on file    Activities of Daily Living In your present state of health, do you have any difficulty performing the following activities: 09/18/2020  Hearing? N  Vision? N  Difficulty concentrating or making decisions? N  Walking or climbing stairs? N  Dressing or bathing? N  Doing errands, shopping? N  Preparing Food and eating ? N  Using the Toilet? N  In the past six months, have you accidently leaked urine? N  Do you have problems with loss of bowel control? N  Managing your Medications? N  Managing your Finances? N  Housekeeping or managing your Housekeeping? N  Some recent data might be hidden    Patient Education/ Literacy How often do you need to have someone help you when you read instructions, pamphlets, or other written materials from your doctor or pharmacy?: 1 - Never What is the last grade level you completed in school?: 12th grade and 2 years of college  Exercise Current Exercise Habits: The patient does not participate in regular exercise at present, Exercise limited by: orthopedic condition(s)  Diet Patient reports consuming 3 meals a day and 2 snack(s) a day Patient reports that her primary diet is: Regular Patient reports that she does have regular access to food.   Depression Screen PHQ 2/9 Scores 09/18/2020 09/06/2020 05/30/2020 01/29/2020 09/08/2019 02/10/2019 09/06/2018  PHQ - 2 Score 0 0 0 0 0 1 1  PHQ- 9 Score - - -  3 - 6 -     Fall Risk Fall Risk  09/18/2020 09/06/2020 05/30/2020 01/29/2020 09/08/2019  Falls in the past  year? 0 0 0 0 0  Number falls in past yr: - - - - 0  Injury with Fall? - - - - 0     Objective:  Shelly Coss Thrall seemed alert and oriented and she participated appropriately during our telephone visit.  Blood Pressure Weight BMI  BP Readings from Last 3 Encounters:  09/06/20 123/75  08/16/20 112/72  06/17/20 132/72   Wt Readings from Last 3 Encounters:  09/06/20 174 lb 9.6 oz (79.2 kg)  08/16/20 174 lb (78.9 kg)  06/17/20 160 lb 9.6 oz (72.8 kg)   BMI Readings from Last 1 Encounters:  09/06/20 29.97 kg/m    *Unable to obtain current vital signs, weight, and BMI due to telephone visit type  Hearing/Vision  . Autumn did not seem to have difficulty with hearing/understanding during the telephone conversation . Reports that she has had a formal eye exam by an eye care professional within the past year . Reports that she has not had a formal hearing evaluation within the past year *Unable to fully assess hearing and vision during telephone visit type  Cognitive Function: 6CIT Screen 09/18/2020 09/08/2019  What Year? 0 points 0 points  What month? 0 points 0 points  What time? 0 points 0 points  Count back from 20 0 points 0 points  Months in reverse 0 points 0 points  Repeat phrase 0 points 2 points  Total Score 0 2   (Normal:0-7, Significant for Dysfunction: >8)  Normal Cognitive Function Screening: Yes   Immunization & Health Maintenance Record Immunization History  Administered Date(s) Administered  . Influenza,inj,Quad PF,6+ Mos 10/06/2016, 08/05/2017, 08/08/2018, 09/06/2020  . Moderna SARS-COVID-2 Vaccination 02/19/2020, 03/18/2020  . Pneumococcal Conjugate-13 10/06/2016  . Tdap 02/04/2018  . Zoster Recombinat (Shingrix) 09/06/2018    Health Maintenance  Topic Date Due  . PAP SMEAR-Modifier  07/03/2019  . MAMMOGRAM  11/04/2020  . COLONOSCOPY  02/03/2022  . TETANUS/TDAP  02/05/2028  . INFLUENZA VACCINE  Completed  . COVID-19 Vaccine  Completed  .  Hepatitis C Screening  Completed  . HIV Screening  Completed       Assessment  This is a routine wellness examination for [employer].  Health Maintenance: Due or Overdue Health Maintenance Due  Topic Date Due  . PAP SMEAR-Modifier  07/03/2019    Shelly Coss Delacruz does not need a referral for Community Assistance: Care Management:   no Social Work:    no Prescription Assistance:  no Nutrition/Diabetes Education:  no   Plan:  Personalized Goals Goals Addressed   None    Personalized Health Maintenance & Screening Recommendations  Screening Pap smear and pelvic exam   Lung Cancer Screening Recommended: Done (Low Dose CT Chest recommended if Age 11-80 years, 30 pack-year currently smoking OR have quit w/in past 15 years) Hepatitis C Screening recommended: Done  HIV Screening recommended: no  Advanced Directives: Written information was not prepared per patient's request.  Referrals & Orders No orders of the defined types were placed in this encounter.   Follow-up Plan . Follow-up with Betty Norlander, DO as planned . Schedule for pap smear and pelvic exam    I have personally reviewed and noted the following in the patient's chart:   . Medical and social history . Use of alcohol, tobacco or illicit drugs  . Current medications and supplements .  Functional ability and status . Nutritional status . Physical activity . Advanced directives . List of other physicians . Hospitalizations, surgeries, and ER visits in previous 12 months . Vitals . Screenings to include cognitive, depression, and falls . Referrals and appointments  In addition, I have reviewed and discussed with [name] certain preventive protocols, quality metrics, and best practice recommendations. A written personalized care plan for preventive services as well as general preventive health recommendations is available and can be mailed to the patient at her request.       Rolena Infante LPN [id number]

## 2020-09-19 DIAGNOSIS — M4722 Other spondylosis with radiculopathy, cervical region: Secondary | ICD-10-CM | POA: Diagnosis not present

## 2020-09-19 DIAGNOSIS — M4322 Fusion of spine, cervical region: Secondary | ICD-10-CM | POA: Diagnosis not present

## 2020-09-19 DIAGNOSIS — M542 Cervicalgia: Secondary | ICD-10-CM | POA: Diagnosis not present

## 2020-09-19 DIAGNOSIS — G894 Chronic pain syndrome: Secondary | ICD-10-CM | POA: Diagnosis not present

## 2020-09-20 DIAGNOSIS — M4150 Other secondary scoliosis, site unspecified: Secondary | ICD-10-CM | POA: Diagnosis not present

## 2020-09-20 DIAGNOSIS — M7918 Myalgia, other site: Secondary | ICD-10-CM | POA: Diagnosis not present

## 2020-09-20 DIAGNOSIS — M542 Cervicalgia: Secondary | ICD-10-CM | POA: Diagnosis not present

## 2020-09-20 DIAGNOSIS — G894 Chronic pain syndrome: Secondary | ICD-10-CM | POA: Diagnosis not present

## 2020-09-20 DIAGNOSIS — M4322 Fusion of spine, cervical region: Secondary | ICD-10-CM | POA: Diagnosis not present

## 2020-09-20 DIAGNOSIS — M4722 Other spondylosis with radiculopathy, cervical region: Secondary | ICD-10-CM | POA: Diagnosis not present

## 2020-10-18 ENCOUNTER — Other Ambulatory Visit: Payer: Self-pay | Admitting: Family Medicine

## 2020-10-19 ENCOUNTER — Other Ambulatory Visit: Payer: Self-pay | Admitting: Family Medicine

## 2020-10-19 DIAGNOSIS — F411 Generalized anxiety disorder: Secondary | ICD-10-CM

## 2020-10-21 DIAGNOSIS — M4722 Other spondylosis with radiculopathy, cervical region: Secondary | ICD-10-CM | POA: Diagnosis not present

## 2020-10-21 DIAGNOSIS — M4322 Fusion of spine, cervical region: Secondary | ICD-10-CM | POA: Diagnosis not present

## 2020-10-21 DIAGNOSIS — M542 Cervicalgia: Secondary | ICD-10-CM | POA: Diagnosis not present

## 2020-10-21 DIAGNOSIS — G894 Chronic pain syndrome: Secondary | ICD-10-CM | POA: Diagnosis not present

## 2020-11-20 DIAGNOSIS — G894 Chronic pain syndrome: Secondary | ICD-10-CM | POA: Diagnosis not present

## 2020-11-20 DIAGNOSIS — M4322 Fusion of spine, cervical region: Secondary | ICD-10-CM | POA: Diagnosis not present

## 2020-11-20 DIAGNOSIS — M542 Cervicalgia: Secondary | ICD-10-CM | POA: Diagnosis not present

## 2020-11-21 ENCOUNTER — Other Ambulatory Visit: Payer: Self-pay | Admitting: Family Medicine

## 2020-11-21 DIAGNOSIS — F411 Generalized anxiety disorder: Secondary | ICD-10-CM

## 2020-12-09 ENCOUNTER — Other Ambulatory Visit: Payer: Self-pay | Admitting: Family Medicine

## 2020-12-09 DIAGNOSIS — F411 Generalized anxiety disorder: Secondary | ICD-10-CM

## 2020-12-18 DIAGNOSIS — M542 Cervicalgia: Secondary | ICD-10-CM | POA: Diagnosis not present

## 2020-12-18 DIAGNOSIS — M4322 Fusion of spine, cervical region: Secondary | ICD-10-CM | POA: Diagnosis not present

## 2020-12-18 DIAGNOSIS — G894 Chronic pain syndrome: Secondary | ICD-10-CM | POA: Diagnosis not present

## 2020-12-21 ENCOUNTER — Inpatient Hospital Stay (HOSPITAL_COMMUNITY): Payer: Medicare HMO

## 2020-12-21 ENCOUNTER — Other Ambulatory Visit: Payer: Self-pay

## 2020-12-21 ENCOUNTER — Encounter (HOSPITAL_COMMUNITY)
Admission: EM | Disposition: A | Discharge status: Home / Self Care | Payer: Self-pay | Source: Home / Self Care | Attending: Family Medicine

## 2020-12-21 ENCOUNTER — Emergency Department (HOSPITAL_COMMUNITY): Payer: Medicare HMO

## 2020-12-21 ENCOUNTER — Emergency Department (HOSPITAL_COMMUNITY): Payer: Medicare HMO | Admitting: Anesthesiology

## 2020-12-21 ENCOUNTER — Inpatient Hospital Stay (HOSPITAL_COMMUNITY)
Admission: EM | Admit: 2020-12-21 | Discharge: 2020-12-28 | DRG: 853 | Disposition: A | Discharge status: Home Health | Payer: Medicare HMO | Attending: General Surgery | Admitting: General Surgery

## 2020-12-21 ENCOUNTER — Encounter (HOSPITAL_COMMUNITY): Payer: Self-pay | Admitting: *Deleted

## 2020-12-21 DIAGNOSIS — F1320 Sedative, hypnotic or anxiolytic dependence, uncomplicated: Secondary | ICD-10-CM | POA: Diagnosis present

## 2020-12-21 DIAGNOSIS — Z79899 Other long term (current) drug therapy: Secondary | ICD-10-CM

## 2020-12-21 DIAGNOSIS — K6389 Other specified diseases of intestine: Secondary | ICD-10-CM | POA: Diagnosis not present

## 2020-12-21 DIAGNOSIS — Z789 Other specified health status: Secondary | ICD-10-CM | POA: Diagnosis not present

## 2020-12-21 DIAGNOSIS — D649 Anemia, unspecified: Secondary | ICD-10-CM | POA: Diagnosis present

## 2020-12-21 DIAGNOSIS — F32A Depression, unspecified: Secondary | ICD-10-CM | POA: Diagnosis present

## 2020-12-21 DIAGNOSIS — Z978 Presence of other specified devices: Secondary | ICD-10-CM

## 2020-12-21 DIAGNOSIS — Z6833 Body mass index (BMI) 33.0-33.9, adult: Secondary | ICD-10-CM

## 2020-12-21 DIAGNOSIS — J690 Pneumonitis due to inhalation of food and vomit: Secondary | ICD-10-CM | POA: Diagnosis not present

## 2020-12-21 DIAGNOSIS — I9581 Postprocedural hypotension: Secondary | ICD-10-CM | POA: Diagnosis not present

## 2020-12-21 DIAGNOSIS — G894 Chronic pain syndrome: Secondary | ICD-10-CM | POA: Diagnosis present

## 2020-12-21 DIAGNOSIS — K658 Other peritonitis: Secondary | ICD-10-CM | POA: Diagnosis not present

## 2020-12-21 DIAGNOSIS — K631 Perforation of intestine (nontraumatic): Principal | ICD-10-CM | POA: Diagnosis present

## 2020-12-21 DIAGNOSIS — Z981 Arthrodesis status: Secondary | ICD-10-CM

## 2020-12-21 DIAGNOSIS — K633 Ulcer of intestine: Secondary | ICD-10-CM | POA: Diagnosis not present

## 2020-12-21 DIAGNOSIS — R0902 Hypoxemia: Secondary | ICD-10-CM | POA: Diagnosis not present

## 2020-12-21 DIAGNOSIS — R188 Other ascites: Secondary | ICD-10-CM | POA: Diagnosis not present

## 2020-12-21 DIAGNOSIS — J9600 Acute respiratory failure, unspecified whether with hypoxia or hypercapnia: Secondary | ICD-10-CM | POA: Diagnosis not present

## 2020-12-21 DIAGNOSIS — Z79891 Long term (current) use of opiate analgesic: Secondary | ICD-10-CM | POA: Diagnosis not present

## 2020-12-21 DIAGNOSIS — I700 Atherosclerosis of aorta: Secondary | ICD-10-CM | POA: Diagnosis not present

## 2020-12-21 DIAGNOSIS — D62 Acute posthemorrhagic anemia: Secondary | ICD-10-CM | POA: Diagnosis not present

## 2020-12-21 DIAGNOSIS — E785 Hyperlipidemia, unspecified: Secondary | ICD-10-CM | POA: Diagnosis present

## 2020-12-21 DIAGNOSIS — E876 Hypokalemia: Secondary | ICD-10-CM | POA: Diagnosis not present

## 2020-12-21 DIAGNOSIS — K219 Gastro-esophageal reflux disease without esophagitis: Secondary | ICD-10-CM | POA: Diagnosis present

## 2020-12-21 DIAGNOSIS — J95821 Acute postprocedural respiratory failure: Secondary | ICD-10-CM | POA: Diagnosis not present

## 2020-12-21 DIAGNOSIS — F331 Major depressive disorder, recurrent, moderate: Secondary | ICD-10-CM | POA: Diagnosis not present

## 2020-12-21 DIAGNOSIS — G4730 Sleep apnea, unspecified: Secondary | ICD-10-CM | POA: Diagnosis not present

## 2020-12-21 DIAGNOSIS — F410 Panic disorder [episodic paroxysmal anxiety] without agoraphobia: Secondary | ICD-10-CM | POA: Diagnosis present

## 2020-12-21 DIAGNOSIS — Z9889 Other specified postprocedural states: Secondary | ICD-10-CM | POA: Diagnosis not present

## 2020-12-21 DIAGNOSIS — R1084 Generalized abdominal pain: Secondary | ICD-10-CM | POA: Diagnosis not present

## 2020-12-21 DIAGNOSIS — R0600 Dyspnea, unspecified: Secondary | ICD-10-CM | POA: Diagnosis not present

## 2020-12-21 DIAGNOSIS — R231 Pallor: Secondary | ICD-10-CM | POA: Diagnosis present

## 2020-12-21 DIAGNOSIS — I10 Essential (primary) hypertension: Secondary | ICD-10-CM | POA: Diagnosis present

## 2020-12-21 DIAGNOSIS — Z20822 Contact with and (suspected) exposure to covid-19: Secondary | ICD-10-CM | POA: Diagnosis not present

## 2020-12-21 DIAGNOSIS — A4151 Sepsis due to Escherichia coli [E. coli]: Secondary | ICD-10-CM | POA: Diagnosis not present

## 2020-12-21 DIAGNOSIS — M545 Low back pain, unspecified: Secondary | ICD-10-CM | POA: Diagnosis present

## 2020-12-21 DIAGNOSIS — R6521 Severe sepsis with septic shock: Secondary | ICD-10-CM | POA: Diagnosis present

## 2020-12-21 DIAGNOSIS — E669 Obesity, unspecified: Secondary | ICD-10-CM | POA: Diagnosis present

## 2020-12-21 DIAGNOSIS — K7200 Acute and subacute hepatic failure without coma: Secondary | ICD-10-CM | POA: Diagnosis not present

## 2020-12-21 DIAGNOSIS — A419 Sepsis, unspecified organism: Secondary | ICD-10-CM | POA: Diagnosis present

## 2020-12-21 DIAGNOSIS — Z4682 Encounter for fitting and adjustment of non-vascular catheter: Secondary | ICD-10-CM | POA: Diagnosis not present

## 2020-12-21 DIAGNOSIS — Z5189 Encounter for other specified aftercare: Secondary | ICD-10-CM

## 2020-12-21 DIAGNOSIS — M810 Age-related osteoporosis without current pathological fracture: Secondary | ICD-10-CM | POA: Diagnosis present

## 2020-12-21 DIAGNOSIS — R69 Illness, unspecified: Secondary | ICD-10-CM | POA: Diagnosis not present

## 2020-12-21 DIAGNOSIS — R0689 Other abnormalities of breathing: Secondary | ICD-10-CM

## 2020-12-21 DIAGNOSIS — R000 Tachycardia, unspecified: Secondary | ICD-10-CM | POA: Diagnosis not present

## 2020-12-21 DIAGNOSIS — Z8249 Family history of ischemic heart disease and other diseases of the circulatory system: Secondary | ICD-10-CM

## 2020-12-21 DIAGNOSIS — Z886 Allergy status to analgesic agent status: Secondary | ICD-10-CM

## 2020-12-21 DIAGNOSIS — R109 Unspecified abdominal pain: Secondary | ICD-10-CM | POA: Diagnosis not present

## 2020-12-21 DIAGNOSIS — K5730 Diverticulosis of large intestine without perforation or abscess without bleeding: Secondary | ICD-10-CM | POA: Diagnosis not present

## 2020-12-21 DIAGNOSIS — F17200 Nicotine dependence, unspecified, uncomplicated: Secondary | ICD-10-CM | POA: Diagnosis present

## 2020-12-21 DIAGNOSIS — J9811 Atelectasis: Secondary | ICD-10-CM | POA: Diagnosis not present

## 2020-12-21 DIAGNOSIS — Z743 Need for continuous supervision: Secondary | ICD-10-CM | POA: Diagnosis not present

## 2020-12-21 DIAGNOSIS — F17210 Nicotine dependence, cigarettes, uncomplicated: Secondary | ICD-10-CM | POA: Diagnosis present

## 2020-12-21 DIAGNOSIS — Z833 Family history of diabetes mellitus: Secondary | ICD-10-CM

## 2020-12-21 HISTORY — PX: COLECTOMY WITH COLOSTOMY CREATION/HARTMANN PROCEDURE: SHX6598

## 2020-12-21 HISTORY — DX: Encounter for other specified aftercare: Z51.89

## 2020-12-21 HISTORY — DX: Ulcer of intestine: K63.3

## 2020-12-21 LAB — COMPREHENSIVE METABOLIC PANEL
ALT: 86 U/L — ABNORMAL HIGH (ref 0–44)
AST: 93 U/L — ABNORMAL HIGH (ref 15–41)
Albumin: 3.6 g/dL (ref 3.5–5.0)
Alkaline Phosphatase: 115 U/L (ref 38–126)
Anion gap: 9 (ref 5–15)
BUN: 20 mg/dL (ref 8–23)
CO2: 29 mmol/L (ref 22–32)
Calcium: 9 mg/dL (ref 8.9–10.3)
Chloride: 98 mmol/L (ref 98–111)
Creatinine, Ser: 0.63 mg/dL (ref 0.44–1.00)
GFR, Estimated: >60 mL/min (ref >60)
Glucose, Bld: 133 mg/dL — ABNORMAL HIGH (ref 70–99)
Potassium: 3.6 mmol/L (ref 3.5–5.1)
Sodium: 136 mmol/L (ref 135–145)
Total Bilirubin: 0.8 mg/dL (ref 0.3–1.2)
Total Protein: 6.7 g/dL (ref 6.5–8.1)

## 2020-12-21 LAB — RESP PANEL BY RT-PCR (FLU A&B, COVID) ARPGX2
Influenza A by PCR: NEGATIVE
Influenza B by PCR: NEGATIVE
SARS Coronavirus 2 by RT PCR: NEGATIVE

## 2020-12-21 LAB — CBC WITH DIFFERENTIAL/PLATELET
Abs Immature Granulocytes: 0.09 10*3/uL — ABNORMAL HIGH (ref 0.00–0.07)
Basophils Absolute: 0.1 10*3/uL (ref 0.0–0.1)
Basophils Relative: 0 %
Eosinophils Absolute: 0.1 10*3/uL (ref 0.0–0.5)
Eosinophils Relative: 1 %
HCT: 43.3 % (ref 36.0–46.0)
Hemoglobin: 14 g/dL (ref 12.0–15.0)
Immature Granulocytes: 1 %
Lymphocytes Relative: 7 %
Lymphs Abs: 0.9 10*3/uL (ref 0.7–4.0)
MCH: 28.5 pg (ref 26.0–34.0)
MCHC: 32.3 g/dL (ref 30.0–36.0)
MCV: 88 fL (ref 80.0–100.0)
Monocytes Absolute: 0.7 10*3/uL (ref 0.1–1.0)
Monocytes Relative: 5 %
Neutro Abs: 12 10*3/uL — ABNORMAL HIGH (ref 1.7–7.7)
Neutrophils Relative %: 86 %
Platelets: 266 10*3/uL (ref 150–400)
RBC: 4.92 MIL/uL (ref 3.87–5.11)
RDW: 13.2 % (ref 11.5–15.5)
WBC: 13.8 10*3/uL — ABNORMAL HIGH (ref 4.0–10.5)
nRBC: 0 % (ref 0.0–0.2)

## 2020-12-21 LAB — I-STAT BETA HCG BLOOD, ED (MC, WL, AP ONLY): I-stat hCG, quantitative: <5 m[IU]/mL (ref <5)

## 2020-12-21 LAB — LIPASE, BLOOD: Lipase: 20 U/L (ref 11–51)

## 2020-12-21 SURGERY — COLECTOMY, WITH COLOSTOMY CREATION
Anesthesia: General

## 2020-12-21 MED ORDER — HYDROMORPHONE HCL 1 MG/ML IJ SOLN: 0.5000 mg | Freq: Once | INTRAMUSCULAR | Status: AC

## 2020-12-21 MED ORDER — HYDROMORPHONE HCL 1 MG/ML IJ SOLN
0.5000 mg | Freq: Once | INTRAMUSCULAR | Status: AC
  Administered 2020-12-21: 0.5 mg via INTRAVENOUS
  Filled 2020-12-21: qty 1

## 2020-12-21 MED ORDER — SODIUM CHLORIDE 0.9 % IV SOLN
2.0000 g | INTRAVENOUS | Status: AC
  Administered 2020-12-21: 2 g via INTRAVENOUS
  Filled 2020-12-21 (×2): qty 2

## 2020-12-21 MED ORDER — METOPROLOL TARTRATE 5 MG/5ML IV SOLN
5.0000 mg | Freq: Four times a day (QID) | INTRAVENOUS | Status: DC
  Administered 2020-12-22 – 2020-12-24 (×11): 5 mg via INTRAVENOUS
  Filled 2020-12-21 (×11): qty 5

## 2020-12-21 MED ORDER — HYDROMORPHONE HCL 1 MG/ML IJ SOLN
INTRAMUSCULAR | Status: AC
  Administered 2020-12-21: 0.5 mg via INTRAVENOUS
  Filled 2020-12-21: qty 1

## 2020-12-21 MED ORDER — PANTOPRAZOLE SODIUM 40 MG IV SOLR
40.0000 mg | INTRAVENOUS | Status: DC
  Administered 2020-12-22 – 2020-12-28 (×7): 40 mg via INTRAVENOUS
  Filled 2020-12-21 (×7): qty 40

## 2020-12-21 MED ORDER — ONDANSETRON 4 MG PO TBDP
4.0000 mg | ORAL_TABLET | Freq: Four times a day (QID) | ORAL | Status: DC | PRN
  Filled 2020-12-21: qty 1

## 2020-12-21 MED ORDER — SODIUM CHLORIDE 0.9 % IR SOLN
Status: DC | PRN
  Administered 2020-12-21: 1000 mL

## 2020-12-21 MED ORDER — PROPOFOL 1000 MG/100ML IV EMUL
5.0000 ug/kg/min | INTRAVENOUS | Status: DC
  Administered 2020-12-21: 5 ug/kg/min via INTRAVENOUS
  Administered 2020-12-22: 75 ug/kg/min via INTRAVENOUS
  Administered 2020-12-22: 80 ug/kg/min via INTRAVENOUS
  Administered 2020-12-22: 75 ug/kg/min via INTRAVENOUS
  Administered 2020-12-22: 80 ug/kg/min via INTRAVENOUS
  Administered 2020-12-22: 65 ug/kg/min via INTRAVENOUS
  Administered 2020-12-22 (×2): 80 ug/kg/min via INTRAVENOUS
  Administered 2020-12-23: 60 ug/kg/min via INTRAVENOUS
  Administered 2020-12-23 (×2): 80 ug/kg/min via INTRAVENOUS
  Filled 2020-12-21 (×4): qty 100
  Filled 2020-12-21: qty 300
  Filled 2020-12-21 (×5): qty 100

## 2020-12-21 MED ORDER — SUCCINYLCHOLINE CHLORIDE 20 MG/ML IJ SOLN
INTRAMUSCULAR | Status: DC | PRN
  Administered 2020-12-21: 100 mg via INTRAVENOUS

## 2020-12-21 MED ORDER — MIDAZOLAM HCL 5 MG/5ML IJ SOLN
INTRAMUSCULAR | Status: DC | PRN
  Administered 2020-12-21: 2 mg via INTRAVENOUS

## 2020-12-21 MED ORDER — DIPHENHYDRAMINE HCL 12.5 MG/5ML PO ELIX: 12.5000 mg | ORAL_SOLUTION | Freq: Four times a day (QID) | ORAL | Status: DC | PRN

## 2020-12-21 MED ORDER — KETOROLAC TROMETHAMINE 15 MG/ML IJ SOLN: 15.0000 mg | Freq: Four times a day (QID) | INTRAMUSCULAR | Status: DC | PRN

## 2020-12-21 MED ORDER — METOPROLOL TARTRATE 5 MG/5ML IV SOLN: 5.0000 mg | Freq: Four times a day (QID) | INTRAVENOUS | Status: DC | PRN

## 2020-12-21 MED ORDER — ONDANSETRON HCL 4 MG/2ML IJ SOLN
4.0000 mg | Freq: Once | INTRAMUSCULAR | Status: AC
  Administered 2020-12-21: 4 mg via INTRAVENOUS
  Filled 2020-12-21: qty 2

## 2020-12-21 MED ORDER — BUPIVACAINE LIPOSOME 1.3 % IJ SUSP
INTRAMUSCULAR | Status: DC | PRN
  Administered 2020-12-21: 20 mL

## 2020-12-21 MED ORDER — SODIUM CHLORIDE 0.9 % IV SOLN: Freq: Once | INTRAVENOUS | Status: AC

## 2020-12-21 MED ORDER — LACTATED RINGERS IV SOLN: INTRAVENOUS | Status: DC

## 2020-12-21 MED ORDER — SODIUM CHLORIDE 0.9 % IV SOLN
INTRAVENOUS | Status: DC | PRN
  Administered 2020-12-21 (×5): 80 ug via INTRAVENOUS

## 2020-12-21 MED ORDER — IOHEXOL 300 MG/ML  SOLN
100.0000 mL | Freq: Once | INTRAMUSCULAR | Status: AC | PRN
  Administered 2020-12-21: 100 mL via INTRAVENOUS

## 2020-12-21 MED ORDER — HEPARIN SODIUM (PORCINE) 5000 UNIT/ML IJ SOLN
5000.0000 [IU] | Freq: Three times a day (TID) | INTRAMUSCULAR | Status: DC
  Administered 2020-12-22 – 2020-12-24 (×8): 5000 [IU] via SUBCUTANEOUS
  Filled 2020-12-21 (×8): qty 1

## 2020-12-21 MED ORDER — SODIUM CHLORIDE 0.9 % IV SOLN: Freq: Once | INTRAVENOUS | Status: DC

## 2020-12-21 MED ORDER — ONDANSETRON HCL 4 MG/2ML IJ SOLN
4.0000 mg | Freq: Four times a day (QID) | INTRAMUSCULAR | Status: DC | PRN
  Administered 2020-12-24 – 2020-12-26 (×6): 4 mg via INTRAVENOUS
  Filled 2020-12-21 (×6): qty 2

## 2020-12-21 MED ORDER — NOREPINEPHRINE 4 MG/250ML-% IV SOLN
0.0000 ug/min | INTRAVENOUS | Status: DC
Start: 2020-12-22 — End: 2020-12-21

## 2020-12-21 MED ORDER — ROCURONIUM 10MG/ML (10ML) SYRINGE FOR MEDFUSION PUMP - OPTIME
INTRAVENOUS | Status: DC | PRN
Start: 2020-12-21 — End: 2020-12-21
  Administered 2020-12-21 (×2): 50 mg via INTRAVENOUS

## 2020-12-21 MED ORDER — SODIUM CHLORIDE 0.9 % IV SOLN: 250.0000 mL | INTRAVENOUS | Status: DC

## 2020-12-21 MED ORDER — LACTATED RINGERS IV SOLN
INTRAVENOUS | Status: DC | PRN
Start: 2020-12-21 — End: 2020-12-21

## 2020-12-21 MED ORDER — PROPOFOL 10 MG/ML IV BOLUS
INTRAVENOUS | Status: AC
  Filled 2020-12-21: qty 20

## 2020-12-21 MED ORDER — FENTANYL CITRATE (PF) 100 MCG/2ML IJ SOLN
INTRAMUSCULAR | Status: AC
  Filled 2020-12-21: qty 2

## 2020-12-21 MED ORDER — FENTANYL CITRATE (PF) 100 MCG/2ML IJ SOLN
INTRAMUSCULAR | Status: DC | PRN
  Administered 2020-12-21 (×2): 50 ug via INTRAVENOUS

## 2020-12-21 MED ORDER — MIDAZOLAM HCL 2 MG/2ML IJ SOLN
INTRAMUSCULAR | Status: AC
  Filled 2020-12-21: qty 2

## 2020-12-21 MED ORDER — NOREPINEPHRINE 4 MG/250ML-% IV SOLN
2.0000 ug/min | INTRAVENOUS | Status: DC
  Administered 2020-12-22: 4 ug/min via INTRAVENOUS
  Administered 2020-12-22: 2 ug/min via INTRAVENOUS
  Filled 2020-12-21 (×2): qty 250

## 2020-12-21 MED ORDER — PROPOFOL 10 MG/ML IV BOLUS
INTRAVENOUS | Status: DC | PRN
Start: 2020-12-21 — End: 2020-12-21
  Administered 2020-12-21: 100 mg via INTRAVENOUS

## 2020-12-21 MED ORDER — BUPIVACAINE LIPOSOME 1.3 % IJ SUSP
INTRAMUSCULAR | Status: AC
  Filled 2020-12-21: qty 20

## 2020-12-21 MED ORDER — DIPHENHYDRAMINE HCL 50 MG/ML IJ SOLN
12.5000 mg | Freq: Four times a day (QID) | INTRAMUSCULAR | Status: DC | PRN
  Administered 2020-12-23 – 2020-12-24 (×2): 12.5 mg via INTRAVENOUS
  Filled 2020-12-21 (×2): qty 1

## 2020-12-21 MED ORDER — SODIUM CHLORIDE 0.9 % IV SOLN: INTRAVENOUS | Status: DC | PRN

## 2020-12-21 MED ORDER — MORPHINE SULFATE (PF) 2 MG/ML IV SOLN
2.0000 mg | INTRAVENOUS | Status: DC | PRN
  Administered 2020-12-22 (×3): 2 mg via INTRAVENOUS
  Filled 2020-12-21 (×3): qty 1

## 2020-12-21 MED ORDER — CHLORHEXIDINE GLUCONATE CLOTH 2 % EX PADS: 6.0000 | MEDICATED_PAD | Freq: Once | CUTANEOUS | Status: DC

## 2020-12-21 MED ORDER — PIPERACILLIN-TAZOBACTAM 3.375 G IVPB
3.3750 g | Freq: Three times a day (TID) | INTRAVENOUS | Status: DC
  Administered 2020-12-22 – 2020-12-26 (×13): 3.375 g via INTRAVENOUS
  Filled 2020-12-21 (×14): qty 50

## 2020-12-21 MED ORDER — PIPERACILLIN-TAZOBACTAM 3.375 G IVPB 30 MIN
3.3750 g | Freq: Once | INTRAVENOUS | Status: AC
  Administered 2020-12-21: 3.375 g via INTRAVENOUS
  Filled 2020-12-21: qty 50

## 2020-12-21 SURGICAL SUPPLY — 65 items
APL PRP STRL LF DISP 70% ISPRP (MISCELLANEOUS) ×2
APPLIER CLIP 11 MED OPEN (CLIP) ×2
APPLIER CLIP 13 LRG OPEN (CLIP)
APR CLP LRG 13 20 CLIP (CLIP)
APR CLP MED 11 20 MLT OPN (CLIP) ×1
BARRIER SKIN 2 3/4 (OSTOMY) IMPLANT
BNDG GAUZE ELAST 4 BULKY (GAUZE/BANDAGES/DRESSINGS) ×4 IMPLANT
BRR SKN FLT 2.75X2.25 2 PC (OSTOMY)
CELLS DAT CNTRL 66122 CELL SVR (MISCELLANEOUS) IMPLANT
CHLORAPREP W/TINT 26 (MISCELLANEOUS) ×4 IMPLANT
CLAMP POUCH DRAINAGE QUIET (OSTOMY) IMPLANT
CLIP APPLIE 11 MED OPEN (CLIP) ×1 IMPLANT
CLIP APPLIE 13 LRG OPEN (CLIP) IMPLANT
CLOTH BEACON ORANGE TIMEOUT ST (SAFETY) ×2 IMPLANT
COVER LIGHT HANDLE STERIS (MISCELLANEOUS) ×4 IMPLANT
COVER WAND RF STERILE (DRAPES) ×2 IMPLANT
DRAPE WARM FLUID 44X44 (DRAPES) ×2 IMPLANT
ELECT BLADE 6 FLAT ULTRCLN (ELECTRODE) ×2 IMPLANT
ELECT REM PT RETURN 9FT ADLT (ELECTROSURGICAL) ×2
ELECTRODE REM PT RTRN 9FT ADLT (ELECTROSURGICAL) ×1 IMPLANT
GAUZE SPONGE 4X4 12PLY STRL (GAUZE/BANDAGES/DRESSINGS) ×2 IMPLANT
GLOVE SURG ENC MOIS LTX SZ6.5 (GLOVE) ×4 IMPLANT
GLOVE SURG SS PI 7.5 STRL IVOR (GLOVE) ×4 IMPLANT
GLOVE SURG UNDER POLY LF SZ7 (GLOVE) ×2 IMPLANT
GOWN STRL REUS W/TWL LRG LVL3 (GOWN DISPOSABLE) ×6 IMPLANT
HANDLE SUCTION POOLE (INSTRUMENTS) ×1 IMPLANT
INST SET MAJOR GENERAL (KITS) ×2 IMPLANT
KIT TURNOVER KIT A (KITS) ×2 IMPLANT
LIGASURE IMPACT 36 18CM CVD LR (INSTRUMENTS) ×2 IMPLANT
MANIFOLD NEPTUNE II (INSTRUMENTS) ×2 IMPLANT
NEEDLE HYPO 21X1.5 SAFETY (NEEDLE) ×2 IMPLANT
NS IRRIG 1000ML POUR BTL (IV SOLUTION) ×4 IMPLANT
PACK ABDOMINAL MAJOR (CUSTOM PROCEDURE TRAY) ×2 IMPLANT
PAD ABD 5X9 TENDERSORB (GAUZE/BANDAGES/DRESSINGS) ×2 IMPLANT
PAD ARMBOARD 7.5X6 YLW CONV (MISCELLANEOUS) ×2 IMPLANT
POUCH OSTOMY 2 3/4  H 3804 (WOUND CARE) ×2
POUCH OSTOMY 2 3/4 H 3804 (WOUND CARE) ×1
POUCH OSTOMY 2 PC DRNBL 2.25 (WOUND CARE) IMPLANT
POUCH OSTOMY 2 PC DRNBL 2.75 (WOUND CARE) ×1 IMPLANT
POUCH OSTOMY DRNBL 2 1/4 (WOUND CARE)
RELOAD LINEAR CUT PROX 55 BLUE (ENDOMECHANICALS) IMPLANT
RELOAD PROXIMATE 75MM BLUE (ENDOMECHANICALS) ×2 IMPLANT
RETRACTOR WND ALEXIS 25 LRG (MISCELLANEOUS) IMPLANT
RTRCTR WOUND ALEXIS 18CM MED (MISCELLANEOUS)
RTRCTR WOUND ALEXIS 25CM LRG (MISCELLANEOUS)
SET BASIN LINEN APH (SET/KITS/TRAYS/PACK) ×2 IMPLANT
SPONGE LAP 18X18 RF (DISPOSABLE) ×2 IMPLANT
STAPLER CUT CVD 40MM GREEN (STAPLE) ×2 IMPLANT
STAPLER GUN LINEAR PROX 60 (STAPLE) IMPLANT
STAPLER PROXIMATE 55 BLUE (STAPLE) IMPLANT
STAPLER PROXIMATE 75MM BLUE (STAPLE) ×2 IMPLANT
STAPLER VISISTAT (STAPLE) ×2 IMPLANT
SUCTION POOLE HANDLE (INSTRUMENTS) ×2
SUT CHROMIC 0 SH (SUTURE) ×2 IMPLANT
SUT CHROMIC 2 0 CT 1 (SUTURE) ×6 IMPLANT
SUT PDS AB CT VIOLET #0 27IN (SUTURE) IMPLANT
SUT SILK 2 0 (SUTURE)
SUT SILK 2 0 SH (SUTURE) ×2 IMPLANT
SUT SILK 2-0 18XBRD TIE 12 (SUTURE) IMPLANT
SUT SILK 3 0 SH CR/8 (SUTURE) ×2 IMPLANT
SUT VIC AB 3-0 SH 27 (SUTURE)
SUT VIC AB 3-0 SH 27X BRD (SUTURE) IMPLANT
SYR 20ML LL LF (SYRINGE) ×2 IMPLANT
TAPE HYPAFIX 4 X30'CHARGABLE (GAUZE/BANDAGES/DRESSINGS) ×2 IMPLANT
TRAY FOLEY MTR SLVR 16FR STAT (SET/KITS/TRAYS/PACK) ×2 IMPLANT

## 2020-12-21 NOTE — Anesthesia Preprocedure Evaluation (Addendum)
Anesthesia Evaluation  Patient identified by MRN, date of birth, ID band Patient awake    Reviewed: Allergy & Precautions, H&P , NPO status , Patient's Chart, lab work & pertinent test results, reviewed documented beta blocker date and time   Airway Mallampati: II  TM Distance: >3 FB Neck ROM: full    Dental no notable dental hx. (+) Teeth Intact   Pulmonary sleep apnea , former smoker,    Pulmonary exam normal breath sounds clear to auscultation       Cardiovascular Exercise Tolerance: Good hypertension, negative cardio ROS   Rhythm:regular Rate:Normal     Neuro/Psych  Headaches, Seizures -, Well Controlled,  PSYCHIATRIC DISORDERS Anxiety Depression  Neuromuscular disease    GI/Hepatic negative GI ROS, Neg liver ROS,   Endo/Other  negative endocrine ROS  Renal/GU ARFRenal disease  negative genitourinary   Musculoskeletal   Abdominal   Peds  Hematology negative hematology ROS (+)   Anesthesia Other Findings   Reproductive/Obstetrics negative OB ROS                             Anesthesia Physical Anesthesia Plan  ASA: IV and emergent  Anesthesia Plan: General   Post-op Pain Management:    Induction:   PONV Risk Score and Plan: Propofol infusion  Airway Management Planned:   Additional Equipment:   Intra-op Plan:   Post-operative Plan:   Informed Consent: I have reviewed the patients History and Physical, chart, labs and discussed the procedure including the risks, benefits and alternatives for the proposed anesthesia with the patient or authorized representative who has indicated his/her understanding and acceptance.     Dental Advisory Given  Plan Discussed with: CRNA  Anesthesia Plan Comments:         Anesthesia Quick Evaluation

## 2020-12-21 NOTE — ED Notes (Signed)
Informed pt of need for urine sample. Pt stated "I have been dry for a long time so I don't know when I'll be able to."

## 2020-12-21 NOTE — Anesthesia Procedure Notes (Signed)
Procedure Name: Intubation Date/Time: 12/21/2020 8:56 PM Performed by: Louann Sjogren, MD Pre-anesthesia Checklist: Patient identified, Emergency Drugs available, Suction available and Patient being monitored Patient Re-evaluated:Patient Re-evaluated prior to induction Oxygen Delivery Method: Circle system utilized Preoxygenation: Pre-oxygenation with 100% oxygen Induction Type: IV induction, Rapid sequence and Cricoid Pressure applied Ventilation: Mask ventilation without difficulty Laryngoscope Size: Mac and 3 Grade View: Grade I Tube type: Oral Tube size: 7.0 mm Number of attempts: 1 Airway Equipment and Method: Stylet Placement Confirmation: ETT inserted through vocal cords under direct vision,  positive ETCO2 and breath sounds checked- equal and bilateral Tube secured with: Tape Dental Injury: Teeth and Oropharynx as per pre-operative assessment  Comments: Gastric contents entered oropharynx during intubation.  Suctioned, turned to right, then quickly placed ETT.  Tube suctioned before positive pressure ventillation.  Minimal output.

## 2020-12-21 NOTE — ED Provider Notes (Incomplete)
[facility] Provider Note   CSN: [id number] Arrival date & time: 12/21/20  1604     History No chief complaint on file.   [name] is a 60 y.o. female  HPI   Sigmoid colon perf 6-7cm in size stool ball. Stercolitis.     Cervical spine surgery (2005, 2007, 2010)   Cesarean section (1988, 1994)   Abdominal hysterectomy (1997)   colonscopy and esophagogastrodudononescopy (02-04-12)   Cholecystectomy (03/14/2012)   Lumbar disc surgery (11/2014)   Back surgery       Past Medical History:  Diagnosis Date  . Abdominal pain   . Anxiety   . Arthritis   . Blood in stool   . Breast changes, fibrocystic   . Bruises easily   . Cardiomegaly 2018  . Clotting disorder (HCC)     itp , none since 1976, no current hematologist  . Constipation   . DDD (degenerative disc disease), cervical    with lumbar issues  . Depression   . Generalized headaches   . GERD (gastroesophageal reflux disease)   . Hemorrhoid   . Hyperlipidemia   . Hypertension   . Idiopathic thrombocytopenia ([place])    as child  . Leg swelling    both ankles  . Liver lesion   . Nasal congestion   . Nausea & vomiting   . Neuromuscular disorder ([place])    back  injury  . Neuromuscular disorder ([place])    3 neck surgeries,neck fusion.pin,plates,screws  . Osteoporosis   . Panic attacks    pt also  has germaphobia  . Rectal bleeding   . Seizures ([place]) 1998   stress induced, one time, none since, no seizure meds  . Seizures ([place])    Stated she had 2 weeks ago,it was like a transient,stare.  . Sleep apnea    STOPBANG=4  . Spleen enlarged 1976, none since   r/t idiopathic thrombocytopenia  . Trouble swallowing   . Unintentional weight loss   . Weakness    weakness varies    Patient Active Problem List   Diagnosis Date Noted  . Under care of pain management specialist 05/30/2020  . Benzodiazepine dependence ([place]) 08/08/2018  . Controlled substance agreement broken 08/08/2018   . Hyperlipemia 08/05/2017  . Chest pain 06/30/2017  . AKI (acute kidney injury) ([place]) 07/09/2016  . Hypokalemia 07/09/2016  . Obesity (BMI 30-39.9) 07/02/2016  . Osteopenia 12/20/2013  . Diverticulosis of colon (without mention of hemorrhage) 02/04/2012  . GERD (gastroesophageal reflux disease) 02/04/2012  . Dysphagia 02/04/2012  . Unspecified gastritis and gastroduodenitis without mention of hemorrhage 02/04/2012  . Cystic liver mass 01/26/2012  . Cholecystitis chronic 01/26/2012  . SYNCOPE 12/20/2007  . GAD (generalized anxiety disorder) 01/25/2007  . Depression 01/25/2007  . Essential hypertension 01/25/2007  . BREAST MASSES, BILATERAL 01/25/2007  . DEGENERATIVE DISC DISEASE, CERVICAL SPINE 01/25/2007  . LOW BACK PAIN 01/25/2007  . SEIZURE DISORDER 01/25/2007  . Insomnia 01/25/2007  . DEPENDENT EDEMA, LEGS, BILATERAL 01/25/2007  . Current smoker 01/25/2007  . TAH/BSO, HX OF 01/25/2007    Past Surgical History:  Procedure Laterality Date  . ABDOMINAL HYSTERECTOMY  1997  . BACK SURGERY    . CERVICAL SPINE SURGERY  2005, 2007, 2010   x3, fusion done,plates and screws present  . [place]  . CHOLECYSTECTOMY  03/14/2012   Procedure: LAPAROSCOPIC CHOLECYSTECTOMY;  Surgeon: Stark Klein, MD;  Location: [facility];  Service: General;  Laterality: N/A;  . colonscopy  and esophagogastrodudononescopy  02-04-12  . LUMBAR DISC SURGERY  11/2014     OB History    Gravida  3   Para  3   Term  1   Preterm  2   AB      Living  3     SAB      IAB      Ectopic      Multiple      Live Births              Family History  Problem Relation Age of Onset  . Hypertension Mother   . Heart disease Mother   . Lung cancer Father   . Cancer Father        lung  . Colon cancer Paternal Grandmother   . Cancer Paternal Grandmother        colon  . Heart disease Maternal Grandmother   . Diabetes Sister   . Bipolar disorder Sister   . Heart disease Sister         Congestive Heart Failure  . Hypertension Sister     Social History   Tobacco Use  . Smoking status: Former Smoker    Packs/day: 1.00    Years: 12.00    Pack years: 12.00    Types: Cigarettes    Start date: 12/20/2005    Quit date: 09/20/2020    Years since quitting: 0.2  . Smokeless tobacco: Never Used  Vaping Use  . Vaping Use: Former  . Quit date: 09/07/2016  . Devices: only used for about a month  Substance Use Topics  . Alcohol use: No    Alcohol/week: 0.0 standard drinks  . Drug use: Yes    Types: Marijuana    Home Medications Prior to Admission medications   Medication Sig Start Date End Date Taking? Authorizing Provider  amitriptyline (ELAVIL) 25 MG tablet TAKE 1 TO 2 TABLETS BY MOUTH EVERY DAY AT BEDTIME 08/04/19   [provider]  atenolol (TENORMIN) 50 MG tablet Take 1 tablet (50 mg total) by mouth daily. 09/06/20   [name], DO  busPIRone (BUSPAR) 15 MG tablet Take 1 tablet (15 mg total) by mouth 3 (three) times daily. 09/06/20   [name], DO  citalopram (CELEXA) 40 MG tablet Take 1 tablet (40 mg total) by mouth daily with breakfast. 09/06/20   Ronnie Doss M, DO  famotidine (PEPCID) 20 MG tablet Take 1 tablet (20 mg total) by mouth daily as needed for heartburn or indigestion. 08/16/20   Milus Banister, MD  hydrALAZINE (APRESOLINE) 50 MG tablet Take 1 tablet (50 mg total) by mouth 2 (two) times daily as needed. 05/20/20 08/18/20  Verta Ellen., NP  lisinopril-hydrochlorothiazide (ZESTORETIC) 20-25 MG tablet TAKE 1 TABLET BY MOUTH EVERY DAY 10/18/20   Arnoldo Lenis, MD  nitroGLYCERIN (NITROSTAT) 0.4 MG SL tablet Place 1 tablet (0.4 mg total) under the tongue every 5 (five) minutes as needed for chest pain. 05/30/20 08/28/20  Ronnie Doss M, DO  ondansetron (ZOFRAN) 4 MG tablet Take 4 mg by mouth every 8 (eight) hours as needed. for nausea 07/29/19   [provider]  Oxycodone HCl 10 MG TABS Take 5 mg by  mouth 2 (two) times daily.  08/20/17   [provider]  rosuvastatin (CRESTOR) 20 MG tablet Take 1 tablet (20 mg total) by mouth daily. 06/03/20   [name], DO  traZODone (DESYREL) 100 MG tablet Take 1.5 tablets (150 mg  total) by mouth at bedtime as needed for sleep. 05/30/20   Ronnie Doss M, DO  hydrochlorothiazide (HYDRODIURIL) 25 MG tablet Take 25 mg by mouth daily.  05/20/20  [provider]    Allergies    Nsaids  Review of Systems   Review of Systems  Constitutional: Negative for chills and fever.  HENT: Negative for congestion.   Eyes: Negative for pain.  Respiratory: Negative for cough and shortness of breath.   Cardiovascular: Negative for chest pain and leg swelling.  Gastrointestinal: Positive for abdominal pain, constipation and nausea. Negative for diarrhea and vomiting.  Genitourinary: Negative for dysuria.  Musculoskeletal: Negative for myalgias.  Skin: Negative for rash.  Neurological: Negative for dizziness and headaches.    Physical Exam Updated Vital Signs BP (!) 150/91 (BP Location: Right Arm)   Pulse (!) 105   Temp 99 F (37.2 C) (Oral)   Resp 18   Ht 5\' 4"  (1.626 m)   Wt 77.1 kg   SpO2 91%   BMI 29.18 kg/m   Physical Exam Vitals and nursing note reviewed.  Constitutional:      General: She is in acute distress.  HENT:     Head: Normocephalic and atraumatic.     Nose: Nose normal.     Mouth/Throat:     Mouth: Mucous membranes are dry.  Eyes:     General: No scleral icterus. Cardiovascular:     Rate and Rhythm: Normal rate and regular rhythm.     Pulses: Normal pulses.     Heart sounds: Normal heart sounds.  Pulmonary:     Effort: Pulmonary effort is normal. No respiratory distress.     Breath sounds: No wheezing.  Abdominal:     Tenderness: There is abdominal tenderness. There is rebound. There is no right CVA tenderness, left CVA tenderness or guarding.     Comments: Abdomen is diffusely severely tender   Musculoskeletal:     Cervical back: Normal range of motion.     Right lower leg: No edema.     Left lower leg: No edema.  Skin:    General: Skin is warm and dry.     Capillary Refill: Capillary refill takes less than 2 seconds.  Neurological:     Mental Status: She is alert. Mental status is at baseline.  Psychiatric:        Mood and Affect: Mood normal.        Behavior: Behavior normal.     ED Results / Procedures / Treatments   Labs (all labs ordered are listed, but only abnormal results are displayed) Labs Reviewed - No data to display  EKG None  Radiology No results found.  Procedures Procedures {Remember to document critical care time when appropriate:1}  Medications Ordered in ED Medications - No data to display  ED Course  I have reviewed the triage vital signs and the nursing notes.  Pertinent labs & imaging results that were available during my care of the patient were reviewed by me and considered in my medical decision making (see chart for details).    MDM Rules/Calculators/A&P                          *** Final Clinical Impression(s) / ED Diagnoses Final diagnoses:  None    Rx / DC Orders ED Discharge Orders    None

## 2020-12-21 NOTE — ED Triage Notes (Signed)
Pt brought in by [facility] from home with c/o abdominal pain and nausea that started this morning. Pt was given Fentanyl 52mcg IV and Zofran 4mg  IV by EMS PTA. Pt reports her pain has dropped from a 10 down to a 4 with the pain medication from EMS.

## 2020-12-21 NOTE — ED Notes (Signed)
Pt. Placed on 2 L of O2 due to Sats being 86 on RA. Pts. sats are currently 95.

## 2020-12-21 NOTE — H&P (Signed)
[facility] History and Physical  Reason for Referral: Colon perforation Referring Physician:  ED     [name] is a 60 y.o. female.  HPI: [name] is a 60 yo who comes in with abdominal pain that was acute in onset, sharp in nature, and is now constant. It did not get any better during the day and actually got a lot worse.  She has had some associated nausea and vomiting. She reports having some constipation at times but mostly going everyday and having soft stools. She says she has no blood per rectum. She had a colonoscopy 2 years ago that was normal.  She denies any chest pain or SOB but she does have a some nitroglycerin but does not take it.   Past Medical History:  Diagnosis Date  . Abdominal pain   . Anxiety   . Arthritis   . Blood in stool   . Breast changes, fibrocystic   . Bruises easily   . Cardiomegaly 2018  . Clotting disorder (HCC)     itp , none since 1976, no current hematologist  . Constipation   . DDD (degenerative disc disease), cervical    with lumbar issues  . Depression   . Generalized headaches   . GERD (gastroesophageal reflux disease)   . Hemorrhoid   . Hyperlipidemia   . Hypertension   . Idiopathic thrombocytopenia ([place])    as child  . Leg swelling    both ankles  . Liver lesion   . Nasal congestion   . Nausea & vomiting   . Neuromuscular disorder ([place])    back  injury  . Neuromuscular disorder ([place])    3 neck surgeries,neck fusion.pin,plates,screws  . Osteoporosis   . Panic attacks    pt also  has germaphobia  . Rectal bleeding   . Seizures ([place]) 1998   stress induced, one time, none since, no seizure meds  . Seizures ([place])    Stated she had 2 weeks ago,it was like a transient,stare.  . Sleep apnea    STOPBANG=4  . Spleen enlarged 1976, none since   r/t idiopathic thrombocytopenia  . Trouble swallowing   . Unintentional weight loss   . Weakness    weakness varies    Past Surgical History:   Procedure Laterality Date  . ABDOMINAL HYSTERECTOMY  1997  . BACK SURGERY    . CERVICAL SPINE SURGERY  2005, 2007, 2010   x3, fusion done,plates and screws present  . [name]  . CHOLECYSTECTOMY  03/14/2012   Procedure: LAPAROSCOPIC CHOLECYSTECTOMY;  Surgeon: Stark Klein, MD;  Location: [facility];  Service: General;  Laterality: N/A;  . colonscopy and esophagogastrodudononescopy  02-04-12  . LUMBAR DISC SURGERY  11/2014    Family History  Problem Relation Age of Onset  . Hypertension Mother   . Heart disease Mother   . Lung cancer Father   . Cancer Father        lung  . Colon cancer Paternal Grandmother   . Cancer Paternal Grandmother        colon  . Heart disease Maternal Grandmother   . Diabetes Sister   . Bipolar disorder Sister   . Heart disease Sister        Congestive Heart Failure  . Hypertension Sister     Social History   Tobacco Use  . Smoking status: Former Smoker    Packs/day: 1.00    Years: 12.00    Pack years:  12.00    Types: Cigarettes    Start date: 12/20/2005    Quit date: 09/20/2020    Years since quitting: 0.2  . Smokeless tobacco: Never Used  Vaping Use  . Vaping Use: Former  . Quit date: 09/07/2016  . Devices: only used for about a month  Substance Use Topics  . Alcohol use: No    Alcohol/week: 0.0 standard drinks  . Drug use: Yes    Types: Marijuana    Medications: I have reviewed the patient's current medications. Current Facility-Administered Medications  Medication Dose Route Frequency Provider Last Rate Last Admin  . 0.9 %  sodium chloride infusion   Intravenous Once [place], [place] S, [place]      . [START ON 12/22/2020] cefoTEtan (CEFOTAN) 2 g in sodium chloride 0.9 % 100 mL IVPB  2 g Intravenous On Call to OR Virl Cagey, MD      . Chlorhexidine Gluconate Cloth 2 % PADS 6 each  6 each Topical Once Virl Cagey, MD      . HYDROmorphone (DILAUDID) injection 0.5 mg  0.5 mg Intravenous Once Virl Cagey,  MD       Current Outpatient Medications  Medication Sig Dispense Refill Last Dose  . amitriptyline (ELAVIL) 25 MG tablet Take 25 mg by mouth at bedtime.   12/20/2020 at Unknown time  . amoxicillin (AMOXIL) 500 MG capsule Take 500 mg by mouth daily.   12/21/2020 at Unknown time  . atenolol (TENORMIN) 50 MG tablet Take 1 tablet (50 mg total) by mouth daily. 90 tablet 3 12/21/2020 at 0900  . busPIRone (BUSPAR) 15 MG tablet Take 1 tablet (15 mg total) by mouth 3 (three) times daily. 270 tablet 3 12/21/2020 at Unknown time  . citalopram (CELEXA) 40 MG tablet Take 1 tablet (40 mg total) by mouth daily with breakfast. 90 tablet 3 12/21/2020 at Unknown time  . famotidine (PEPCID) 20 MG tablet Take 1 tablet (20 mg total) by mouth daily as needed for heartburn or indigestion. 30 tablet  12/21/2020 at Unknown time  . latanoprost (XALATAN) 0.005 % ophthalmic solution Place 1 drop into both eyes daily.   12/21/2020 at Unknown time  . lisinopril-hydrochlorothiazide (ZESTORETIC) 20-25 MG tablet TAKE 1 TABLET BY MOUTH EVERY DAY 90 tablet 2 12/21/2020 at Unknown time  . nitroGLYCERIN (NITROSTAT) 0.4 MG SL tablet Place 1 tablet (0.4 mg total) under the tongue every 5 (five) minutes as needed for chest pain. 25 tablet 3 PRN  . ondansetron (ZOFRAN) 4 MG tablet Take 4 mg by mouth every 8 (eight) hours as needed. for nausea   12/21/2020 at Unknown time  . Oxycodone HCl 10 MG TABS Take 5 mg by mouth 2 (two) times daily.    12/21/2020 at Unknown time  . rosuvastatin (CRESTOR) 20 MG tablet Take 1 tablet (20 mg total) by mouth daily. 90 tablet 3 12/20/2020 at Unknown time  . tiZANidine (ZANAFLEX) 4 MG tablet Take 4 mg by mouth 3 (three) times daily.   12/20/2020 at Unknown time  . traZODone (DESYREL) 100 MG tablet Take 1.5 tablets (150 mg total) by mouth at bedtime as needed for sleep. 135 tablet 2 12/20/2020 at Unknown time  . hydrALAZINE (APRESOLINE) 50 MG tablet Take 1 tablet (50 mg total) by mouth 2 (two) times daily as needed. 180  tablet 1     Allergies  Allergen Reactions  . Nsaids Nausea Only    Abdominal pain    ROS:  A comprehensive review of systems  was negative except for: Gastrointestinal: positive for abdominal pain, nausea and vomiting  Blood pressure 131/82, pulse (!) 108, temperature 99 F (37.2 C), temperature source Oral, resp. rate (!) 22, height 5\' 4"  (1.626 m), weight 77.1 kg, SpO2 90 %. Physical Exam Vitals reviewed.  Constitutional:      Appearance: Normal appearance.  HENT:     Head: Normocephalic.     Nose: Nose normal.  Eyes:     Extraocular Movements: Extraocular movements intact.  Cardiovascular:     Rate and Rhythm: Tachycardia present.  Pulmonary:     Effort: Pulmonary effort is normal.  Abdominal:     General: There is distension.     Tenderness: There is abdominal tenderness. There is guarding and rebound.  Musculoskeletal:        General: No swelling.  Skin:    General: Skin is warm.  Neurological:     General: No focal deficit present.     Mental Status: She is alert and oriented to person, place, and time.  Psychiatric:        Mood and Affect: Mood normal.        Behavior: Behavior normal.        Thought Content: Thought content normal.     Results: Results for orders placed or performed during the hospital encounter of 12/21/20 (from the past 48 hour(s))  CBC with Differential/Platelet     Status: Abnormal   Collection Time: 12/21/20  4:50 PM  Result Value Ref Range   WBC 13.8 (H) 4.0 - 10.5 K/uL   RBC 4.92 3.87 - 5.11 MIL/uL   Hemoglobin 14.0 12.0 - 15.0 g/dL   HCT 43.3 36.0 - 46.0 %   MCV 88.0 80.0 - 100.0 fL   MCH 28.5 26.0 - 34.0 pg   MCHC 32.3 30.0 - 36.0 g/dL   RDW 13.2 11.5 - 15.5 %   Platelets 266 150 - 400 K/uL   nRBC 0.0 0.0 - 0.2 %   Neutrophils Relative % 86 %   Neutro Abs 12.0 (H) 1.7 - 7.7 K/uL   Lymphocytes Relative 7 %   Lymphs Abs 0.9 0.7 - 4.0 K/uL   Monocytes Relative 5 %   Monocytes Absolute 0.7 0.1 - 1.0 K/uL   Eosinophils  Relative 1 %   Eosinophils Absolute 0.1 0.0 - 0.5 K/uL   Basophils Relative 0 %   Basophils Absolute 0.1 0.0 - 0.1 K/uL   Immature Granulocytes 1 %   Abs Immature Granulocytes 0.09 (H) 0.00 - 0.07 K/uL    Comment: Performed at [facility], [address], [place], [place] [address]  Comprehensive metabolic panel     Status: Abnormal   Collection Time: 12/21/20  4:50 PM  Result Value Ref Range   Sodium 136 135 - 145 mmol/L   Potassium 3.6 3.5 - 5.1 mmol/L   Chloride 98 98 - 111 mmol/L   CO2 29 22 - 32 mmol/L   Glucose, Bld 133 (H) 70 - 99 mg/dL    Comment: Glucose reference range applies only to samples taken after fasting for at least 8 hours.   BUN 20 8 - 23 mg/dL   Creatinine, Ser 0.63 0.44 - 1.00 mg/dL   Calcium 9.0 8.9 - 10.3 mg/dL   Total Protein 6.7 6.5 - 8.1 g/dL   Albumin 3.6 3.5 - 5.0 g/dL   AST 93 (H) 15 - 41 U/L   ALT 86 (H) 0 - 44 U/L   Alkaline Phosphatase 115 38 - 126  U/L   Total Bilirubin 0.8 0.3 - 1.2 mg/dL   GFR, Estimated >60 >60 mL/min    Comment: (NOTE) Calculated using the CKD-EPI Creatinine Equation (2021)    Anion gap 9 5 - 15    Comment: Performed at [facility], [address], [place], [place] [address]  Lipase, blood     Status: None   Collection Time: 12/21/20  4:50 PM  Result Value Ref Range   Lipase 20 11 - 51 U/L    Comment: Performed at [facility], [address], [place], [place] [address]  I-Stat beta hCG blood, ED ([facility], [facility], [facility] only)     Status: None   Collection Time: 12/21/20  4:53 PM  Result Value Ref Range   I-stat hCG, quantitative <5.0 <5 mIU/mL   Comment 3            Comment:   GEST. AGE      CONC.  (mIU/mL)   <=1 WEEK        5 - 50     2 WEEKS       50 - 500     3 WEEKS       100 - 10,000     4 WEEKS     1,000 - 30,000        FEMALE AND NON-PREGNANT FEMALE:     LESS THAN 5 mIU/mL   Resp Panel by RT-PCR (Flu A&B, Covid) Nasopharyngeal Swab     Status: None   Collection Time: 12/21/20  6:13 PM   Specimen: Nasopharyngeal Swab;  Nasopharyngeal(NP) swabs in vial transport medium  Result Value Ref Range   SARS Coronavirus 2 by RT PCR NEGATIVE NEGATIVE    Comment: (NOTE) SARS-CoV-2 target nucleic acids are NOT DETECTED.  The SARS-CoV-2 RNA is generally detectable in upper respiratory specimens during the acute phase of infection. The lowest concentration of SARS-CoV-2 viral copies this assay can detect is 138 copies/mL. A negative result does not preclude SARS-Cov-2 infection and should not be used as the sole basis for treatment or other patient management decisions. A negative result may occur with  improper specimen collection/handling, submission of specimen other than nasopharyngeal swab, presence of viral mutation(s) within the areas targeted by this assay, and inadequate number of viral copies(<138 copies/mL). A negative result must be combined with clinical observations, patient history, and epidemiological information. The expected result is Negative.  Fact Sheet for Patients:  EntrepreneurPulse.com.au  Fact Sheet for Healthcare Providers:  IncredibleEmployment.be  This test is no t yet approved or cleared by the [place] FDA and  has been authorized for detection and/or diagnosis of SARS-CoV-2 by FDA under an Emergency Use Authorization (EUA). This EUA will remain  in effect (meaning this test can be used) for the duration of the COVID-19 declaration under Section 564(b)(1) of the Act, 21 U.S.C.section 360bbb-3(b)(1), unless the authorization is terminated  or revoked sooner.       Influenza A by PCR NEGATIVE NEGATIVE   Influenza B by PCR NEGATIVE NEGATIVE    Comment: (NOTE) The Xpert Xpress SARS-CoV-2/FLU/RSV plus assay is intended as an aid in the diagnosis of influenza from Nasopharyngeal swab specimens and should not be used as a sole basis for treatment. Nasal washings and aspirates are unacceptable for Xpert Xpress  SARS-CoV-2/FLU/RSV testing.  Fact Sheet for Patients: EntrepreneurPulse.com.au  Fact Sheet for Healthcare Providers: IncredibleEmployment.be  This test is not yet approved or cleared by the [place] FDA and has been authorized for detection  and/or diagnosis of SARS-CoV-2 by FDA under an Emergency Use Authorization (EUA). This EUA will remain in effect (meaning this test can be used) for the duration of the COVID-19 declaration under Section 564(b)(1) of the Act, 21 U.S.C. section 360bbb-3(b)(1), unless the authorization is terminated or revoked.  Performed at [facility], [address], [place], [place] [address]    Personally reviewed- free air and large stool bag with air around, some fluid RLQ  CT ABDOMEN PELVIS W CONTRAST  Result Date: 12/21/2020 CLINICAL DATA:  Abdominal distension, bowel obstruction suspected EXAM: CT ABDOMEN AND PELVIS WITH CONTRAST TECHNIQUE: Multidetector CT imaging of the abdomen and pelvis was performed using the standard protocol following bolus administration of intravenous contrast. CONTRAST:  139mL OMNIPAQUE IOHEXOL 300 MG/ML  SOLN COMPARISON:  11/15/2015 FINDINGS: Lower chest: No acute abnormality. Hepatobiliary: No focal liver abnormality is seen. Status post cholecystectomy. Mild postoperative biliary dilatation. Pancreas: Unremarkable. No pancreatic ductal dilatation or surrounding inflammatory changes. Spleen: Normal in size without significant abnormality. Adrenals/Urinary Tract: Adrenal glands are unremarkable. Kidneys are normal, without renal calculi, solid lesion, or hydronephrosis. Bladder is unremarkable. Stomach/Bowel: The stomach and lower esophagus are fluid-filled. Appendix appears normal in a retrocecal lie. There is a moderate burden of stool throughout the colon, with a large stool ball in the distal sigmoid colon and adjacent extraluminal air (series 2, image 68). Vascular/Lymphatic: Aortic  atherosclerosis. No enlarged abdominal or pelvic lymph nodes. Reproductive: Status post hysterectomy. Other: No abdominal wall hernia or abnormality. Small volume pneumoperitoneum and retroperitoneal emphysema. Trace fluid in the right lower quadrant. Musculoskeletal: No acute or significant osseous findings. IMPRESSION: 1. There is a moderate burden of stool throughout the colon, with a large stool ball in the distal sigmoid colon and adjacent extraluminal air throughout the mesentery and retroperitoneum. Findings are consistent with bowel perforation, likely secondary to stercoral ulceration. An obstipating mass of the distal sigmoid would be difficult to exclude and could be further investigated by colonoscopy. 2. Small volume pneumoperitoneum and fluid in the right lower quadrant. These results were called by telephone at the time of interpretation on 12/21/2020 at 6:14 pm to PA [facility] , who verbally acknowledged these results. Aortic Atherosclerosis (ICD10-I70.0). Electronically Signed   By: Eddie Candle M.D.   On: 12/21/2020 18:14     Assessment & Plan:  [name] is a 60 y.o. female with free air and likely a colon perforation based on her CT scan. We discussed surgery and colectomy/ colostomy and risk of bleeding, infection, ostomy placement, and discussed possibility of staying intubated. Discussed possible central line and arterial line.  -COVID negative   All questions were answered to the satisfaction of the patient and family.   Virl Cagey 12/21/2020, 7:29 PM

## 2020-12-21 NOTE — ED Notes (Signed)
Pt. Is being verbally abusive toward staff at this time

## 2020-12-21 NOTE — ED Notes (Signed)
Pt. Has removed all of their leads.

## 2020-12-21 NOTE — Progress Notes (Signed)
[facility]  End colostomy and colectomy for perforation/ likely stercoral ulcer. Notified husband and sister of patient's surgery being completed.  Propofol, PRN pain Respiratory on Vent  Did have emesis during intubation, so at risk of PNA HD ok but drifting BP, may require pressors, levophed ordered to maintain MAP > 60 NPO, NG End colostomy, ostomy RN ordered Foley in place Labs in AM Zosyn for 5 days post op  SCDs, heparin sq Hospitalist consulted for assistance with managing patient Arterial line and central line in place  [name], MD [facility] [address] [place], [place] [address] [phone] (office)

## 2020-12-21 NOTE — Op Note (Signed)
[facility] Operative Note  12/21/20  Preoperative Diagnosis: Pneumoperitoneum, possible colon perforation   Postoperative Diagnosis: Pneumoperitoneum from stercoral ulcer of sigmoid colon, fecal peritonitis    Procedure(s) Performed: Hartman's procedure with sigmoid resection and end colostomy   Surgeon: Lanell Matar. Constance Haw, MD   Assistants: No qualified resident was available    Anesthesia: General endotracheal   Anesthesiologist: Louann Sjogren, MD    Specimens:  Sigmoid colon, suture proximal   Estimated Blood Loss: 50 cc    Blood Replacement: None    Complications: None   Wound Class: Dirty/ Infected    Operative Indications:  [name] is a 60 yo with findings of pneumoperitoneum and possible stercoral ulcer on CT that presented with abdominal pain. We discussed exploration and colectomy and colostomy and risk of bleeding, infection, finding cancer, injury to the ureter, and possibility of needing to stay intubated.   Findings: Large colon perforation with stool, no obvious masses or thickening, likely stercoral ulcer    Procedure: The patient was taken to the operating room and placed supine. General endotracheal anesthesia was induced. She did have some vomit on intubation and had to be suctioned and placed to her side. Intravenous antibiotics were administered per protocol.  A nasogastric tube positioned to decompress the stomach. The abdomen was prepared and draped in the usual sterile fashion.  A lower midline incision was made and carried down through to the fascia. Care was taken when entering with scissors.  Liquid stool was encountered and cultured. There were signs of fecal peritonitis.  The fluid was suctioned out.  A wound protector was placed. There was a large necrotic hole in the side of the sigmoid colon which was looping into the pelvis.  There were some minor adhesions between the sigmoid colon and the lower pelvic anterior wall that were  taken down with cautery to straighten out the pelvis.  From here I could get below the perforation which had stool exposed as well as a ball of stool outside in the mesentery. This was removed and passed off the field.  The distal colon was palpated and no masses were noted just more stool.  A contour TA stapler was used to take the distal end of the colon.  I identified the left ureter and protected it. A ligasure was used to take the mesentery.  The white line of [place] was taken up to the splenic flexure and the proximal point of transection was taken with a linear 66mm stapler.  The remaining mesentery was taken with the ligasure, protecting the ureter.  The colon was marked suture proximal.  There was minor bleeding from the gonadal vessels above the ureter and this was clipped carefully with 2 clips.  The abdomen was irrigated copiously.  The colon was further mobilized and the mesentery was scored to allow for the end colostomy to come up to the abdominal wall without tension.  A circular incision was made in the left abdomen and carried down through to the fascia and a cruciate incision was made. Two fingers were easily passed through. The end colostomy was brought out without any twisting and was under no tension. The end was pink and healthy.  Hemostasis was confirmed.   The fascia was then closed in the standard fashion with 0 PDS suture.  The skin was left open. The wound was irrigated and covered. The ostomy was opened and the staple line removed. Hemostasis was obtained. The ostomy was matured in the standard fashion  with 2-0 Chromic gut suture. The ostomy appliance was placed.  The wound was packed with saline dampened kerlix and covered with an ABD and paper tape.    Final inspection revealed acceptable hemostasis. All counts were correct at the end of the case. Prior to leaving the operating room anesthesia obtained an arterial line and I obtained a central line given her blood pressures that  continued to decrease at times during this case and risk of impending septic shock given her contamination.   Central Line: The right chest and neck was prepped and draped in the usual sterile fashion.  Wearing full gown and gloves, I performed the procedure.  One percent lidocaine was used for local anesthesia. An ultrasound was utilized to assess the jugular vein.  The needle with syringe was advanced into the vein with dark venous return, and a wire was placed using the Seldinger technique without difficulty.  Ectopia was not noted.  The skin was knicked and a dilator was placed, and the three lumen catheter was placed over the wire with continued control of the wire.  There was good draw back of blood from all three lumens and each flushed easily with saline.  The catheter was secured in 4 points with 2-0 silk and a biopatch and dressing was placed.     The patient tolerated the procedure well, and the CXR was ordered to confirm position of the central line.   The patient was taken to the ICU intubated.     [name], MD [facility] [address] [place], [place] [address] [phone] (office)

## 2020-12-21 NOTE — Anesthesia Procedure Notes (Signed)
Arterial Line Insertion Start/End2/10/2021 10:47 PM, 12/21/2020 10:51 PM Performed by: Louann Sjogren, MD  Patient location: OR. Preanesthetic checklist: patient identified, IV checked, site marked, risks and benefits discussed, surgical consent, monitors and equipment checked, pre-op evaluation and timeout performed Patient sedated Left, radial was placed Catheter size: 20 G Hand hygiene performed , maximum sterile barriers used  and Seldinger technique used Allen's test indicative of satisfactory collateral circulation Attempts: 1 Procedure performed using ultrasound guided technique. Ultrasound Notes:anatomy identified, needle tip was noted to be adjacent to the nerve/plexus identified and no ultrasound evidence of intravascular and/or intraneural injection Following insertion, dressing applied. Post procedure assessment: normal  Patient tolerated the procedure well with no immediate complications.

## 2020-12-21 NOTE — ED Notes (Signed)
Patient transported to CT 

## 2020-12-21 NOTE — ED Provider Notes (Signed)
[facility] Provider Note   CSN: [id number] Arrival date & time: 12/21/20  1604     History No chief complaint on file.   [name] is a 60 y.o. female.  HPI Patient is a 60 year old female with a past surgical history significant for hysterectomy, cholecystectomy, C-section Past medical history significant for HTN, chronic low back pain, smoker, cystic liver masses, reflux, obesity, anxiety with benzodiazepine dependence  Patient is presented today with severe sharp 10/10 pain that seems to be worse in the right lower quadrant and radiate into her entire abdomen now.  She states that it did improve somewhat with fentanyl given by EMS she was also given Zofran.  She states however that the pain quickly came back.  She states is constant suitably worsened over the past several hours.  She states that it began this morning when she woke up she states she does not feel that she woke up because of the pain.  There is a have become significantly worse.  She states that she normally poops once per day.  She states that she has not pooped in 2 days.  Denies any dark or tarry stools.  States her last problems were normal.  She states that she does occasionally strain with pooping.  She endorses nausea but denies fevers, chills, cough, congestion, lightheadedness, dizziness, chest pain, shortness of breath.     Past Medical History:  Diagnosis Date   Abdominal pain    Anxiety    Arthritis    Blood in stool    Breast changes, fibrocystic    Bruises easily    Cardiomegaly 2018   Clotting disorder (HCC)     itp , none since 1976, no current hematologist   Constipation    DDD (degenerative disc disease), cervical    with lumbar issues   Depression    Generalized headaches    GERD (gastroesophageal reflux disease)    Hemorrhoid    Hyperlipidemia    Hypertension    Idiopathic thrombocytopenia (HCC)    as child   Leg swelling    both  ankles   Liver lesion    Nasal congestion    Nausea & vomiting    Neuromuscular disorder (HCC)    back  injury   Neuromuscular disorder ([place])    3 neck surgeries,neck fusion.pin,plates,screws   Osteoporosis    Panic attacks    pt also  has germaphobia   Rectal bleeding    Seizures ([place]) 1998   stress induced, one time, none since, no seizure meds   Seizures ([place])    Stated she had 2 weeks ago,it was like a transient,stare.   Sleep apnea    STOPBANG=4   Spleen enlarged 1976, none since   r/t idiopathic thrombocytopenia   Trouble swallowing    Unintentional weight loss    Weakness    weakness varies    Patient Active Problem List   Diagnosis Date Noted   Under care of pain management specialist 05/30/2020   Benzodiazepine dependence ([place]) 08/08/2018   Controlled substance agreement broken 08/08/2018   Hyperlipemia 08/05/2017   Chest pain 06/30/2017   AKI (acute kidney injury) ([place]) 07/09/2016   Hypokalemia 07/09/2016   Obesity (BMI 30-39.9) 07/02/2016   Osteopenia 12/20/2013   Diverticulosis of colon (without mention of hemorrhage) 02/04/2012   GERD (gastroesophageal reflux disease) 02/04/2012   Dysphagia 02/04/2012   Unspecified gastritis and gastroduodenitis without mention of hemorrhage 02/04/2012   Cystic liver mass 01/26/2012  Cholecystitis chronic 01/26/2012   SYNCOPE 12/20/2007   GAD (generalized anxiety disorder) 01/25/2007   Depression 01/25/2007   Essential hypertension 01/25/2007   BREAST MASSES, BILATERAL 01/25/2007   DEGENERATIVE DISC DISEASE, CERVICAL SPINE 01/25/2007   LOW BACK PAIN 01/25/2007   SEIZURE DISORDER 01/25/2007   Insomnia 01/25/2007   DEPENDENT EDEMA, LEGS, BILATERAL 01/25/2007   Current smoker 01/25/2007   TAH/BSO, HX OF 01/25/2007    Past Surgical History:  Procedure Laterality Date   ABDOMINAL HYSTERECTOMY  1997   BACK SURGERY     CERVICAL SPINE SURGERY  2005, 2007, 2010   x3,  fusion done,plates and screws present   [place]  03/14/2012   Procedure: LAPAROSCOPIC CHOLECYSTECTOMY;  Surgeon: Stark Klein, MD;  Location: [facility];  Service: General;  Laterality: N/A;   colonscopy and esophagogastrodudononescopy  02-04-12   LUMBAR [place] SURGERY  11/2014     OB History    Gravida  3   Para  3   Term  1   Preterm  2   AB      Living  3     SAB      IAB      Ectopic      Multiple      Live Births              Family History  Problem Relation Age of Onset   Hypertension Mother    Heart disease Mother    Lung cancer Father    Cancer Father        lung   Colon cancer Paternal Grandmother    Cancer Paternal Grandmother        colon   Heart disease Maternal Grandmother    Diabetes Sister    Bipolar disorder Sister    Heart disease Sister        Congestive Heart Failure   Hypertension Sister     Social History   Tobacco Use   Smoking status: Former Smoker    Packs/day: 1.00    Years: 12.00    Pack years: 12.00    Types: Cigarettes    Start date: 12/20/2005    Quit date: 09/20/2020    Years since quitting: 0.2   Smokeless tobacco: Never Used  Vaping Use   Vaping Use: Former   Quit date: 09/07/2016   Devices: only used for about a month  Substance Use Topics   Alcohol use: No    Alcohol/week: 0.0 standard drinks   Drug use: Yes    Types: Marijuana    Home Medications Prior to Admission medications   Medication Sig Start Date End Date Taking? Authorizing Provider  amitriptyline (ELAVIL) 25 MG tablet Take 25 mg by mouth at bedtime. 08/04/19  Yes [provider]  amoxicillin (AMOXIL) 500 MG capsule Take 500 mg by mouth daily. 12/16/20  Yes [provider]  atenolol (TENORMIN) 50 MG tablet Take 1 tablet (50 mg total) by mouth daily. 09/06/20  Yes Gottschalk, Ashly M, DO  busPIRone (BUSPAR) 15 MG tablet Take 1 tablet (15 mg total) by mouth 3 (three) times  daily. 09/06/20  Yes Ronnie Doss M, DO  citalopram (CELEXA) 40 MG tablet Take 1 tablet (40 mg total) by mouth daily with breakfast. 09/06/20  Yes Gottschalk, Ashly M, DO  famotidine (PEPCID) 20 MG tablet Take 1 tablet (20 mg total) by mouth daily as needed for heartburn or indigestion. 08/16/20  Yes Milus Banister, MD  latanoprost (  XALATAN) 0.005 % ophthalmic solution Place 1 drop into both eyes daily. 12/13/20  Yes [provider]  lisinopril-hydrochlorothiazide (ZESTORETIC) 20-25 MG tablet TAKE 1 TABLET BY MOUTH EVERY DAY 10/18/20  Yes Branch, Alphonse Guild, MD  nitroGLYCERIN (NITROSTAT) 0.4 MG SL tablet Place 1 tablet (0.4 mg total) under the tongue every 5 (five) minutes as needed for chest pain. 05/30/20 08/28/20 Yes Gottschalk, Ashly M, DO  ondansetron (ZOFRAN) 4 MG tablet Take 4 mg by mouth every 8 (eight) hours as needed. for nausea 07/29/19  Yes [provider]  Oxycodone HCl 10 MG TABS Take 5 mg by mouth 2 (two) times daily.  08/20/17  Yes [provider]  rosuvastatin (CRESTOR) 20 MG tablet Take 1 tablet (20 mg total) by mouth daily. 06/03/20  Yes Gottschalk, Leatrice Jewels M, DO  tiZANidine (ZANAFLEX) 4 MG tablet Take 4 mg by mouth 3 (three) times daily. 10/17/20  Yes [provider]  traZODone (DESYREL) 100 MG tablet Take 1.5 tablets (150 mg total) by mouth at bedtime as needed for sleep. 05/30/20  Yes Ronnie Doss M, DO  hydrALAZINE (APRESOLINE) 50 MG tablet Take 1 tablet (50 mg total) by mouth 2 (two) times daily as needed. 05/20/20 08/18/20  Verta Ellen., NP  hydrochlorothiazide (HYDRODIURIL) 25 MG tablet Take 25 mg by mouth daily.  05/20/20  [provider]    Allergies    Nsaids  Review of Systems   Review of Systems  Gastrointestinal: Positive for abdominal pain, constipation and nausea.  All other systems reviewed and are negative.   Physical Exam Updated Vital Signs BP 131/82    Pulse (!) 108    Temp 99 F (37.2 C) (Oral)     Resp (!) 22    Ht 5\' 4"  (1.626 m)    Wt 77.1 kg    SpO2 90%    BMI 29.18 kg/m   Physical Exam Vitals and nursing note reviewed.  Constitutional:      General: She is not in acute distress. HENT:     Head: Normocephalic and atraumatic.     Nose: Nose normal.     Mouth/Throat:     Mouth: Mucous membranes are dry.  Eyes:     General: No scleral icterus. Cardiovascular:     Rate and Rhythm: Normal rate and regular rhythm.     Pulses: Normal pulses.     Heart sounds: Normal heart sounds.  Pulmonary:     Effort: Pulmonary effort is normal. No respiratory distress.     Breath sounds: No wheezing.  Abdominal:     General: There is distension.     Palpations: Abdomen is soft.     Tenderness: There is abdominal tenderness. There is rebound. There is no right CVA tenderness, left CVA tenderness or guarding.     Hernia: No hernia is present.     Comments: Diffuse abdominal tenderness with rebound tenderness.  No guarding.  Abdomen is still but significantly tender.  No tympany with percussion.  Difficult to auscultate bowel sounds.  Musculoskeletal:     Cervical back: Normal range of motion.     Right lower leg: No edema.     Left lower leg: No edema.  Skin:    General: Skin is warm and dry.     Capillary Refill: Capillary refill takes less than 2 seconds.  Neurological:     Mental Status: She is alert. Mental status is at baseline.  Psychiatric:        Mood and  Affect: Mood normal.        Behavior: Behavior normal.     ED Results / Procedures / Treatments   Labs (all labs ordered are listed, but only abnormal results are displayed) Labs Reviewed  CBC WITH DIFFERENTIAL/PLATELET - Abnormal; Notable for the following components:      Result Value   WBC 13.8 (*)    Neutro Abs 12.0 (*)    Abs Immature Granulocytes 0.09 (*)    All other components within normal limits  COMPREHENSIVE METABOLIC PANEL - Abnormal; Notable for the following components:   Glucose, Bld 133 (*)    AST 93  (*)    ALT 86 (*)    All other components within normal limits  RESP PANEL BY RT-PCR (FLU A&B, COVID) ARPGX2  LIPASE, BLOOD  URINALYSIS, ROUTINE W REFLEX MICROSCOPIC  I-STAT BETA HCG BLOOD, ED ([facility], [facility], [facility] ONLY)    EKG None  Radiology CT ABDOMEN PELVIS W CONTRAST  Result Date: 12/21/2020 CLINICAL DATA:  Abdominal distension, bowel obstruction suspected EXAM: CT ABDOMEN AND PELVIS WITH CONTRAST TECHNIQUE: Multidetector CT imaging of the abdomen and pelvis was performed using the standard protocol following bolus administration of intravenous contrast. CONTRAST:  138mL OMNIPAQUE IOHEXOL 300 MG/ML  SOLN COMPARISON:  11/15/2015 FINDINGS: Lower chest: No acute abnormality. Hepatobiliary: No focal liver abnormality is seen. Status post cholecystectomy. Mild postoperative biliary dilatation. Pancreas: Unremarkable. No pancreatic ductal dilatation or surrounding inflammatory changes. Spleen: Normal in size without significant abnormality. Adrenals/Urinary Tract: Adrenal glands are unremarkable. Kidneys are normal, without renal calculi, solid lesion, or hydronephrosis. Bladder is unremarkable. Stomach/Bowel: The stomach and lower esophagus are fluid-filled. Appendix appears normal in a retrocecal lie. There is a moderate burden of stool throughout the colon, with a large stool ball in the distal sigmoid colon and adjacent extraluminal air (series 2, image 68). Vascular/Lymphatic: Aortic atherosclerosis. No enlarged abdominal or pelvic lymph nodes. Reproductive: Status post hysterectomy. Other: No abdominal wall hernia or abnormality. Small volume pneumoperitoneum and retroperitoneal emphysema. Trace fluid in the right lower quadrant. Musculoskeletal: No acute or significant osseous findings. IMPRESSION: 1. There is a moderate burden of stool throughout the colon, with a large stool ball in the distal sigmoid colon and adjacent extraluminal air throughout the mesentery and retroperitoneum. Findings are  consistent with bowel perforation, likely secondary to stercoral ulceration. An obstipating mass of the distal sigmoid would be difficult to exclude and could be further investigated by colonoscopy. 2. Small volume pneumoperitoneum and fluid in the right lower quadrant. These results were called by telephone at the time of interpretation on 12/21/2020 at 6:14 pm to PA [facility] , who verbally acknowledged these results. Aortic Atherosclerosis (ICD10-I70.0). Electronically Signed   By: Eddie Candle M.D.   On: 12/21/2020 18:14    Procedures .Critical Care Performed by: Tedd Sias, PA Authorized by: Tedd Sias, PA   Critical care provider statement:    Critical care time (minutes):  35   Critical care time was exclusive of:  Separately billable procedures and treating other patients and teaching time   Critical care was necessary to treat or prevent imminent or life-threatening deterioration of the following conditions: perforated viscus.   Critical care was time spent personally by me on the following activities:  Discussions with consultants, evaluation of patient's response to treatment, examination of patient, review of old charts, re-evaluation of patient's condition, pulse oximetry, ordering and review of radiographic studies, ordering and review of laboratory studies and ordering and performing treatments and  interventions   I assumed direction of critical care for this patient from another provider in my specialty: no       Medications Ordered in ED Medications  0.9 %  sodium chloride infusion (has no administration in time range)  0.9 %  sodium chloride infusion ( Intravenous New Bag/Given 12/21/20 1723)  ondansetron (ZOFRAN) injection 4 mg (4 mg Intravenous Given 12/21/20 1711)  HYDROmorphone (DILAUDID) injection 0.5 mg (0.5 mg Intravenous Given 12/21/20 1716)  iohexol (OMNIPAQUE) 300 MG/ML solution 100 mL (100 mLs Intravenous Contrast Given 12/21/20 1741)   piperacillin-tazobactam (ZOSYN) IVPB 3.375 g (3.375 g Intravenous New Bag/Given 12/21/20 1900)  HYDROmorphone (DILAUDID) injection 0.5 mg (0.5 mg Intravenous Given 12/21/20 1851)    ED Course  I have reviewed the triage vital signs and the nursing notes.  Pertinent labs & imaging results that were available during my care of the patient were reviewed by me and considered in my medical decision making (see chart for details).    MDM Rules/Calculators/A&P                          Female 60 year old patient who is presenting today with severe worsening right lower quadrant abdominal pain has progressively worsened over the course of the day.  Given abdominal distention, symptoms/signs I suspect she may have perforation or SBO or LBO.  CMP with mild transaminitis.  CBC with leukocytosis with left shift.  Lipase normal.  Pancreatitis.  Covid test pending.  hcg negative for pregnancy.  CT shows colon perforation IMPRESSION:  1. There is a moderate burden of stool throughout the colon, with a  large stool ball in the distal sigmoid colon and adjacent  extraluminal air throughout the mesentery and retroperitoneum.  Findings are consistent with bowel perforation, likely secondary to  stercoral ulceration. An obstipating mass of the distal sigmoid  would be difficult to exclude and could be further investigated by  colonoscopy.  2. Small volume pneumoperitoneum and fluid in the right lower  quadrant.    These results were called by telephone at the time of interpretation  on 12/21/2020 at 6:14 pm to PA [facility] , who verbally  acknowledged these results.    Discussed case with Dr. Constance Haw of general surgery.  Covid test pending at this time.  Patient will go to surgery tonight pending general surgery evaluation.  Pt updated on plan Covid test negative.  Patient sent to general surgery OR. Appreciate Dr. Constance Haw care of pt.   Final Clinical Impression(s) / ED Diagnoses Final  diagnoses:  Colon perforation [facility])    Rx / DC Orders ED Discharge Orders    None       Tedd Sias, [place] 12/22/20 0004    Davonna Belling, MD 12/22/20 [phone]

## 2020-12-21 NOTE — Anesthesia Postprocedure Evaluation (Signed)
Anesthesia Post Note  Patient: [name]  Procedure(s) Performed: COLECTOMY WITH COLOSTOMY CREATION/HARTMANN PROCEDURE (N/A )  Patient location during evaluation: ICU Anesthesia Type: General Level of consciousness: patient remains intubated per anesthesia plan Pain management: pain level controlled Vital Signs Assessment: post-procedure vital signs reviewed and stable Respiratory status: patient on ventilator - see flowsheet for VS and patient remains intubated per anesthesia plan Cardiovascular status: blood pressure returned to baseline Postop Assessment: no headache Anesthetic complications: no   No complications documented.   Last Vitals:  Vitals:   12/21/20 1700 12/21/20 1902  BP: (!) 155/79 131/82  Pulse: (!) 102 (!) 108  Resp: 20 (!) 22  Temp:    SpO2: 93% 90%    Last Pain:  Vitals:   12/21/20 1932  TempSrc:   PainSc: [place]

## 2020-12-21 NOTE — Transfer of Care (Signed)
Immediate Anesthesia Transfer of Care Note  Patient: [name]  Procedure(s) Performed: COLECTOMY WITH COLOSTOMY CREATION/HARTMANN PROCEDURE (N/A )  Patient Location: ICU  Anesthesia Type:General  Level of Consciousness: Patient remains intubated per anesthesia plan  Airway & Oxygen Therapy: Patient remains intubated per anesthesia plan and Patient placed on Ventilator (see vital sign flow sheet for setting)  Post-op Assessment: Report given to RN and Post -op Vital signs reviewed and stable  Post vital signs: Reviewed and stable  Last Vitals:  Vitals Value Taken Time  BP    Temp    Pulse    Resp    SpO2      Last Pain:  Vitals:   12/21/20 1932  TempSrc:   PainSc: 7          Complications: No complications documented.

## 2020-12-22 DIAGNOSIS — K633 Ulcer of intestine: Secondary | ICD-10-CM | POA: Diagnosis not present

## 2020-12-22 DIAGNOSIS — J690 Pneumonitis due to inhalation of food and vomit: Secondary | ICD-10-CM | POA: Diagnosis not present

## 2020-12-22 HISTORY — DX: Pneumonitis due to inhalation of food and vomit: J69.0

## 2020-12-22 LAB — CBC WITH DIFFERENTIAL/PLATELET
Abs Immature Granulocytes: 0.04 10*3/uL (ref 0.00–0.07)
Basophils Absolute: 0 10*3/uL (ref 0.0–0.1)
Basophils Relative: 0 %
Eosinophils Absolute: 0 10*3/uL (ref 0.0–0.5)
Eosinophils Relative: 0 %
HCT: 38.6 % (ref 36.0–46.0)
Hemoglobin: 12.7 g/dL (ref 12.0–15.0)
Immature Granulocytes: 0 %
Lymphocytes Relative: 10 %
Lymphs Abs: 1 10*3/uL (ref 0.7–4.0)
MCH: 29.2 pg (ref 26.0–34.0)
MCHC: 32.9 g/dL (ref 30.0–36.0)
MCV: 88.7 fL (ref 80.0–100.0)
Monocytes Absolute: 0.5 10*3/uL (ref 0.1–1.0)
Monocytes Relative: 5 %
Neutro Abs: 8.6 10*3/uL — ABNORMAL HIGH (ref 1.7–7.7)
Neutrophils Relative %: 85 %
Platelets: 214 10*3/uL (ref 150–400)
RBC: 4.35 MIL/uL (ref 3.87–5.11)
RDW: 13.2 % (ref 11.5–15.5)
WBC: 10.2 10*3/uL (ref 4.0–10.5)
nRBC: 0 % (ref 0.0–0.2)

## 2020-12-22 LAB — COMPREHENSIVE METABOLIC PANEL
ALT: 77 U/L — ABNORMAL HIGH (ref 0–44)
AST: 63 U/L — ABNORMAL HIGH (ref 15–41)
Albumin: 2.6 g/dL — ABNORMAL LOW (ref 3.5–5.0)
Alkaline Phosphatase: 79 U/L (ref 38–126)
Anion gap: 12 (ref 5–15)
BUN: 16 mg/dL (ref 8–23)
CO2: 29 mmol/L (ref 22–32)
Calcium: 8.1 mg/dL — ABNORMAL LOW (ref 8.9–10.3)
Chloride: 96 mmol/L — ABNORMAL LOW (ref 98–111)
Creatinine, Ser: 0.71 mg/dL (ref 0.44–1.00)
GFR, Estimated: >60 mL/min (ref >60)
Glucose, Bld: 148 mg/dL — ABNORMAL HIGH (ref 70–99)
Potassium: 3.1 mmol/L — ABNORMAL LOW (ref 3.5–5.1)
Sodium: 137 mmol/L (ref 135–145)
Total Bilirubin: 1.2 mg/dL (ref 0.3–1.2)
Total Protein: 4.9 g/dL — ABNORMAL LOW (ref 6.5–8.1)

## 2020-12-22 LAB — TRIGLYCERIDES: Triglycerides: 104 mg/dL (ref <150)

## 2020-12-22 LAB — PHOSPHORUS: Phosphorus: 3.3 mg/dL (ref 2.5–4.6)

## 2020-12-22 LAB — BLOOD GAS, ARTERIAL
Acid-Base Excess: 3.3 mmol/L — ABNORMAL HIGH (ref 0.0–2.0)
Bicarbonate: 27.1 mmol/L (ref 20.0–28.0)
FIO2: 40
O2 Saturation: 97.8 %
Patient temperature: 37
pCO2 arterial: 43.6 mmHg (ref 32.0–48.0)
pH, Arterial: 7.417 (ref 7.350–7.450)
pO2, Arterial: 117 mmHg — ABNORMAL HIGH (ref 83.0–108.0)

## 2020-12-22 LAB — GLUCOSE, CAPILLARY
Glucose-Capillary: 117 mg/dL — ABNORMAL HIGH (ref 70–99)
Glucose-Capillary: 129 mg/dL — ABNORMAL HIGH (ref 70–99)
Glucose-Capillary: 121 mg/dL — ABNORMAL HIGH (ref 70–99)

## 2020-12-22 LAB — MAGNESIUM: Magnesium: 1.4 mg/dL — ABNORMAL LOW (ref 1.7–2.4)

## 2020-12-22 LAB — HIV ANTIBODY (ROUTINE TESTING W REFLEX): HIV Screen 4th Generation wRfx: NONREACTIVE

## 2020-12-22 MED ORDER — ORAL CARE MOUTH RINSE
15.0000 mL | OROMUCOSAL | Status: DC
  Administered 2020-12-22 – 2020-12-23 (×15): 15 mL via OROMUCOSAL

## 2020-12-22 MED ORDER — CHLORHEXIDINE GLUCONATE 0.12% ORAL RINSE (MEDLINE KIT)
15.0000 mL | Freq: Two times a day (BID) | OROMUCOSAL | Status: DC
  Administered 2020-12-22 – 2020-12-23 (×4): 15 mL via OROMUCOSAL

## 2020-12-22 MED ORDER — CHLORHEXIDINE GLUCONATE CLOTH 2 % EX PADS
6.0000 | MEDICATED_PAD | Freq: Every day | CUTANEOUS | Status: DC
  Administered 2020-12-22 – 2020-12-27 (×5): 6 via TOPICAL

## 2020-12-22 MED ORDER — MORPHINE SULFATE (PF) 4 MG/ML IV SOLN
4.0000 mg | INTRAVENOUS | Status: DC | PRN
  Administered 2020-12-22 – 2020-12-23 (×7): 4 mg via INTRAVENOUS
  Filled 2020-12-22 (×7): qty 1

## 2020-12-22 MED ORDER — POTASSIUM CHLORIDE 10 MEQ/100ML IV SOLN
10.0000 meq | INTRAVENOUS | Status: AC
  Administered 2020-12-22 (×4): 10 meq via INTRAVENOUS
  Filled 2020-12-22 (×4): qty 100

## 2020-12-22 MED ORDER — MAGNESIUM SULFATE 4 GM/100ML IV SOLN
4.0000 g | Freq: Once | INTRAVENOUS | Status: AC
  Administered 2020-12-22: 4 g via INTRAVENOUS
  Filled 2020-12-22: qty 100

## 2020-12-22 NOTE — Consult Note (Addendum)
Patient Demographics:    [name], is a 60 y.o. female  MRN: [id number]   DOB - Mar 23, 1961  Admit Date - 12/21/2020  Outpatient Primary MD for the patient is [name], DO   Assessment & Plan:    Principal Problem:   Stercoral ulcer of large intestine Active Problems:   Colon perforation ([place])   Aspiration pneumonia of both lower lobes due to gastric secretions [facility])   Depression   Essential hypertension   Current smoker   Under care of pain management specialist    1)Possible aspiration pneumonia--- Per anesthesiology team and general surgeon during induction of anesthesia patient appears to have aspirated -Remains intubated and sedated, -Continue IV Zosyn, continue bronchodilators -2022  2)Stercoral Ulcer perforation with fecal peritonitis and sepsis ---s/p Hartman's procedure with end colostomy-on 12/21/2020 -Postop care per general surgeon -Please see #8 below  3)Hypotension/hemodynamic instability--- suspect due to #1 and #2 above, continue IV Levophed for pressure support -IV fluids as ordered  4)Depression/Anxiety/chronic Pain syndrome--currently sedated with propofol and receiving IV morphine sulfate--- will need oral opiates post-extubation -Post extubation may restart Celexa, BuSpar, trazodone and Elavil -Patient is high risk for opiate withdrawal  5)HTN--- currently hypotensive requiring pressors due to type I #2 above, -Hold atenolol but give IV metoprolol to avoid abrupt discontinuation of beta-blockers -Hold lisinopril HCTZ, hold hydralazine until BP improves  6) social/ethics--- patient is a full code  7)HLD--post extubation may restart Crestor  8) sepsis secondary to #2 above--patient met sepsis criteria on admission with tachycardia, tachypnea, leukocytosis and bowel  perforation with fecal peritonitis -Please see #2 above -Continue IV fluids, continue Levophed for pressure support, patient on IV Zosyn primarily for #1 above -Leukocytosis is improving however BP remains low -Tachycardia may have improved due to-metoprolol use  9)Hypokalemia/hypomagnesemia--- potassium was down to 3.1, Mg was 1.4----replaced IV, will recheck  10)FEN--- currently n.p.o., intubated glucose check as ordered, continue IV fluids, consider IV TPN if prolonged intubation  11) elevated LFTs--- in the setting of shock liver, monitor closely  --- Prophylaxis--IV Protonix for GI prophylaxis and subcu heparin for DVT prophylaxis  --Thank you Dr. Constance Haw for this medical consultation hospitalist service will follow patient concurrently with you  CRITICAL CARE Performed by: Roxan Hockey   Total critical care time: 43 minutes  Critical care time was exclusive of separately billable procedures and treating other patients.  Vent Settings:- -PRVC- 40%/16/TV 500/PEEP 5 Critical care was necessary to treat or prevent imminent or life-threatening deterioration. --Requiring pressure support with IV Levophed  Critical care was time spent personally by me on the following activities: development of treatment plan with patient and/or surrogate as well as nursing, discussions with consultants, evaluation of patient's response to treatment, examination of patient, obtaining history from patient or surrogate, ordering and performing treatments and interventions, ordering and review of laboratory studies, ordering and review of radiographic studies, pulse oximetry and re-evaluation of patient's condition.   Disposition/Need for in-Hospital  Stay- patient unable to be discharged at this time due to --intubated and sedated, hypotensive requiring pressors  Status is: Inpatient  Remains inpatient appropriate because:Hemodynamically unstable   Dispo: The patient is from: Home               Anticipated d/c is to: Home              Anticipated d/c date is: > 3 days              Patient currently is not medically stable to d/c. Barriers: Not Clinically Stable-   Procedures/Lines -Arterial line -Right IJ central line -Foley catheter  With History of - Reviewed by me  Past Medical History:  Diagnosis Date  . Abdominal pain   . Anxiety   . Arthritis   . Blood in stool   . Breast changes, fibrocystic   . Bruises easily   . Cardiomegaly 2018  . Clotting disorder (HCC)     itp , none since 1976, no current hematologist  . Constipation   . DDD (degenerative disc disease), cervical    with lumbar issues  . Depression   . Generalized headaches   . GERD (gastroesophageal reflux disease)   . Hemorrhoid   . Hyperlipidemia   . Hypertension   . Idiopathic thrombocytopenia ([place])    as child  . Leg swelling    both ankles  . Liver lesion   . Nasal congestion   . Nausea & vomiting   . Neuromuscular disorder ([place])    back  injury  . Neuromuscular disorder ([place])    3 neck surgeries,neck fusion.pin,plates,screws  . Osteoporosis   . Panic attacks    pt also  has germaphobia  . Rectal bleeding   . Seizures ([place]) 1998   stress induced, one time, none since, no seizure meds  . Seizures ([place])    Stated she had 2 weeks ago,it was like a transient,stare.  . Sleep apnea    STOPBANG=4  . Spleen enlarged 1976, none since   r/t idiopathic thrombocytopenia  . Trouble swallowing   . Unintentional weight loss   . Weakness    weakness varies      Past Surgical History:  Procedure Laterality Date  . ABDOMINAL HYSTERECTOMY  1997  . BACK SURGERY    . CERVICAL SPINE SURGERY  2005, 2007, 2010   x3, fusion done,plates and screws present  . [place]  . CHOLECYSTECTOMY  03/14/2012   Procedure: LAPAROSCOPIC CHOLECYSTECTOMY;  Surgeon: Stark Klein, MD;  Location: [facility] ORS;  Service: General;  Laterality: N/A;  . colonscopy and esophagogastrodudononescopy  02-04-12   . [place] SURGERY  11/2014   No chief complaint on file.     HPI:    [name]  is a 60 y.o. female with past medical history relevant for HTN, depression/anxiety and chronic pain syndrome, HLD and obesity admitted on 12/21/2020 with with stercoral ulcer perforation --- underwent colectomy with s/p Hartman's procedure with end colostomy on 12/21/20--with aspiration episode intraoperatively, remains hypotensive requiring pressors - Dr. Constance Haw general surgeon requested hospitalist medicine input for management of ongoing medical issues including concerns for aspiration, respiratory failure, hemodynamic instability requiring Levophed for pressure support  On admission patient did meet sepsis criteria -    Review of systems:    In addition to the HPI above,   A full Review of  Systems was done, all other systems reviewed are negative except as noted above in HPI , .  Social History:  Reviewed by me    Social History   Tobacco Use  . Smoking status: Former Smoker    Packs/day: 1.00    Years: 12.00    Pack years: 12.00    Types: Cigarettes    Start date: 12/20/2005    Quit date: 09/20/2020    Years since quitting: 0.2  . Smokeless tobacco: Never Used  Substance Use Topics  . Alcohol use: No    Alcohol/week: 0.0 standard drinks       Family History :  Reviewed by me    Family History  Problem Relation Age of Onset  . Hypertension Mother   . Heart disease Mother   . Lung cancer Father   . Cancer Father        lung  . Colon cancer Paternal Grandmother   . Cancer Paternal Grandmother        colon  . Heart disease Maternal Grandmother   . Diabetes Sister   . Bipolar disorder Sister   . Heart disease Sister        Congestive Heart Failure  . Hypertension Sister      Home Medications:   Prior to Admission medications   Medication Sig Start Date End Date Taking? Authorizing Provider  amitriptyline (ELAVIL) 25 MG tablet Take 25 mg by mouth at  bedtime. 08/04/19  Yes [provider]  amoxicillin (AMOXIL) 500 MG capsule Take 500 mg by mouth daily. 12/16/20  Yes [provider]  atenolol (TENORMIN) 50 MG tablet Take 1 tablet (50 mg total) by mouth daily. 09/06/20  Yes Gottschalk, Ashly M, DO  busPIRone (BUSPAR) 15 MG tablet Take 1 tablet (15 mg total) by mouth 3 (three) times daily. 09/06/20  Yes Ronnie Doss M, DO  citalopram (CELEXA) 40 MG tablet Take 1 tablet (40 mg total) by mouth daily with breakfast. 09/06/20  Yes Gottschalk, Ashly M, DO  famotidine (PEPCID) 20 MG tablet Take 1 tablet (20 mg total) by mouth daily as needed for heartburn or indigestion. 08/16/20  Yes Milus Banister, MD  latanoprost (XALATAN) 0.005 % ophthalmic solution Place 1 drop into both eyes daily. 12/13/20  Yes [provider]  lisinopril-hydrochlorothiazide (ZESTORETIC) 20-25 MG tablet TAKE 1 TABLET BY MOUTH EVERY DAY 10/18/20  Yes Branch, Alphonse Guild, MD  nitroGLYCERIN (NITROSTAT) 0.4 MG SL tablet Place 1 tablet (0.4 mg total) under the tongue every 5 (five) minutes as needed for chest pain. 05/30/20 08/28/20 Yes Gottschalk, Ashly M, DO  ondansetron (ZOFRAN) 4 MG tablet Take 4 mg by mouth every 8 (eight) hours as needed. for nausea 07/29/19  Yes [provider]  Oxycodone HCl 10 MG TABS Take 5 mg by mouth 2 (two) times daily.  08/20/17  Yes [provider]  rosuvastatin (CRESTOR) 20 MG tablet Take 1 tablet (20 mg total) by mouth daily. 06/03/20  Yes Gottschalk, Leatrice Jewels M, DO  tiZANidine (ZANAFLEX) 4 MG tablet Take 4 mg by mouth 3 (three) times daily. 10/17/20  Yes [provider]  traZODone (DESYREL) 100 MG tablet Take 1.5 tablets (150 mg total) by mouth at bedtime as needed for sleep. 05/30/20  Yes Ronnie Doss M, DO  hydrALAZINE (APRESOLINE) 50 MG tablet Take 1 tablet (50 mg total) by mouth 2 (two) times daily as needed. 05/20/20 08/18/20  Verta Ellen., NP  hydrochlorothiazide (HYDRODIURIL) 25 MG tablet  Take 25 mg by mouth daily.  05/20/20  [provider]     Allergies:     Allergies  Allergen Reactions  . Nsaids Nausea Only    Abdominal pain     Physical Exam:   Vitals  Blood pressure (!) 95/39, pulse 86, temperature 98.4 F (36.9 C), temperature source Axillary, resp. rate 15, height _0  (1.626 m), weight 90.4 kg, SpO2 99 %.  Physical Examination: General appearance -intubated and sedated  mental status -sedated Eyes - sclera anicteric Neck-right IJ central line Mouth/Nose- ET tube Chest -diminished in bases, no wheezing Heart - S1 and S2 normal, regular  Abdomen -postop wound dressing intact, left lower quadrant with colostomy with scant sanguinous content, diminished bowel sounds Neuro{Psych--eval limited as patient is intubated and sedated  Extremities - no pedal edema noted, intact peripheral pulses * Skin - warm, dry GU-Foley catheter MSK-arterial line      Data Review:    CBC Recent Labs  Lab 12/21/20 1650 12/22/20 0339  WBC 13.8* 10.2  HGB 14.0 12.7  HCT 43.3 38.6  PLT 266 214  MCV 88.0 88.7  MCH 28.5 29.2  MCHC 32.3 32.9  RDW 13.2 13.2  LYMPHSABS 0.9 1.0  MONOABS 0.7 0.5  EOSABS 0.1 0.0  BASOSABS 0.1 0.0   ------------------------------------------------------------------------------------------------------------------  Chemistries  Recent Labs  Lab 12/21/20 1650 12/22/20 0339  NA 136 137  K 3.6 3.1*  CL 98 96*  CO2 29 29  GLUCOSE 133* 148*  BUN 20 16  CREATININE 0.63 0.71  CALCIUM 9.0 8.1*  MG  --  1.4*  AST 93* 63*  ALT 86* 77*  ALKPHOS 115 79  BILITOT 0.8 1.2   ------------------------------------------------------------------------------------------------------------------ estimated creatinine clearance is 80.4 mL/min (by C-G formula based on SCr of 0.71 mg/dL). ------------------------------------------------------------------------------------------------------------------ No results for input(s): TSH,  T4TOTAL, T3FREE, THYROIDAB in the last 72 hours.  Invalid input(s): FREET3   Coagulation profile No results for input(s): INR, PROTIME in the last 168 hours. ------------------------------------------------------------------------------------------------------------------- No results for input(s): DDIMER in the last 72 hours. -------------------------------------------------------------------------------------------------------------------  Cardiac Enzymes No results for input(s): CKMB, TROPONINI, MYOGLOBIN in the last 168 hours.  Invalid input(s): CK ------------------------------------------------------------------------------------------------------------------ No results found for: BNP   ---------------------------------------------------------------------------------------------------------------  Urinalysis    Component Value Date/Time   COLORURINE YELLOW 09/21/2017 0932   APPEARANCEUR Clear 02/04/2018 1640   LABSPEC >=1.030 (A) 09/21/2017 0932   PHURINE 6.0 09/21/2017 0932   GLUCOSEU Negative 02/04/2018 1640   GLUCOSEU NEGATIVE 09/21/2017 0932   HGBUR NEGATIVE 09/21/2017 0932   BILIRUBINUR Negative 02/04/2018 1640   KETONESUR NEGATIVE 09/21/2017 0932   PROTEINUR Negative 02/04/2018 1640   PROTEINUR 30 (A) 08/28/2017 1018   UROBILINOGEN 0.2 09/21/2017 0932   NITRITE Negative 02/04/2018 1640   NITRITE NEGATIVE 09/21/2017 0932   LEUKOCYTESUR Negative 02/04/2018 1640    ----------------------------------------------------------------------------------------------------------------   Imaging Results:    CT ABDOMEN PELVIS W CONTRAST  Result Date: 12/21/2020 CLINICAL DATA:  Abdominal distension, bowel obstruction suspected EXAM: CT ABDOMEN AND PELVIS WITH CONTRAST TECHNIQUE: Multidetector CT imaging of the abdomen and pelvis was performed using the standard protocol following bolus administration of intravenous contrast. CONTRAST:  14m OMNIPAQUE IOHEXOL 300 MG/ML   SOLN COMPARISON:  11/15/2015 FINDINGS: Lower chest: No acute abnormality. Hepatobiliary: No focal liver abnormality is seen. Status post cholecystectomy. Mild postoperative biliary dilatation. Pancreas: Unremarkable. No pancreatic ductal dilatation or surrounding inflammatory changes. Spleen: Normal in size without significant abnormality. Adrenals/Urinary Tract: Adrenal glands are unremarkable. Kidneys are normal, without renal calculi, solid lesion, or hydronephrosis. Bladder is unremarkable. Stomach/Bowel: The stomach and lower esophagus are fluid-filled. Appendix appears normal in a retrocecal lie. There is  a moderate burden of stool throughout the colon, with a large stool ball in the distal sigmoid colon and adjacent extraluminal air (series 2, image 68). Vascular/Lymphatic: Aortic atherosclerosis. No enlarged abdominal or pelvic lymph nodes. Reproductive: Status post hysterectomy. Other: No abdominal wall hernia or abnormality. Small volume pneumoperitoneum and retroperitoneal emphysema. Trace fluid in the right lower quadrant. Musculoskeletal: No acute or significant osseous findings. IMPRESSION: 1. There is a moderate burden of stool throughout the colon, with a large stool ball in the distal sigmoid colon and adjacent extraluminal air throughout the mesentery and retroperitoneum. Findings are consistent with bowel perforation, likely secondary to stercoral ulceration. An obstipating mass of the distal sigmoid would be difficult to exclude and could be further investigated by colonoscopy. 2. Small volume pneumoperitoneum and fluid in the right lower quadrant. These results were called by telephone at the time of interpretation on 12/21/2020 at 6:14 pm to PA [facility] , who verbally acknowledged these results. Aortic Atherosclerosis (ICD10-I70.0). Electronically Signed   By: Eddie Candle M.D.   On: 12/21/2020 18:14   DG Chest Port 1 View  Result Date: 12/22/2020 CLINICAL DATA:  Endotracheal tube  placement. EXAM: PORTABLE CHEST 1 VIEW COMPARISON:  Chest radiograph 06/06/2020, included lung bases from abdominal CT earlier today. FINDINGS: Endotracheal tube tip 13 mm from the carina. Tip and side port of the enteric tube below the diaphragm in the stomach. Right central line tip in the right atrium. Heart is normal in size. Curvilinear lucency abutting the right heart border corresponds to free air under the hemidiaphragm. Bibasilar atelectasis, increased. No pneumothorax. No significant pleural effusion. IMPRESSION: 1. Endotracheal tube tip 13 mm from the carina. Enteric tube tip and side port below the diaphragm in the stomach. 2. Right central line tip in the right atrium. 3. Increased bibasilar atelectasis. 4. Known free air in the upper abdomen characterized on CT. Electronically Signed   By: Keith Rake M.D.   On: 12/22/2020 00:11    Radiological Exams on Admission: CT ABDOMEN PELVIS W CONTRAST  Result Date: 12/21/2020 CLINICAL DATA:  Abdominal distension, bowel obstruction suspected EXAM: CT ABDOMEN AND PELVIS WITH CONTRAST TECHNIQUE: Multidetector CT imaging of the abdomen and pelvis was performed using the standard protocol following bolus administration of intravenous contrast. CONTRAST:  167m OMNIPAQUE IOHEXOL 300 MG/ML  SOLN COMPARISON:  11/15/2015 FINDINGS: Lower chest: No acute abnormality. Hepatobiliary: No focal liver abnormality is seen. Status post cholecystectomy. Mild postoperative biliary dilatation. Pancreas: Unremarkable. No pancreatic ductal dilatation or surrounding inflammatory changes. Spleen: Normal in size without significant abnormality. Adrenals/Urinary Tract: Adrenal glands are unremarkable. Kidneys are normal, without renal calculi, solid lesion, or hydronephrosis. Bladder is unremarkable. Stomach/Bowel: The stomach and lower esophagus are fluid-filled. Appendix appears normal in a retrocecal lie. There is a moderate burden of stool throughout the colon, with a  large stool ball in the distal sigmoid colon and adjacent extraluminal air (series 2, image 68). Vascular/Lymphatic: Aortic atherosclerosis. No enlarged abdominal or pelvic lymph nodes. Reproductive: Status post hysterectomy. Other: No abdominal wall hernia or abnormality. Small volume pneumoperitoneum and retroperitoneal emphysema. Trace fluid in the right lower quadrant. Musculoskeletal: No acute or significant osseous findings. IMPRESSION: 1. There is a moderate burden of stool throughout the colon, with a large stool ball in the distal sigmoid colon and adjacent extraluminal air throughout the mesentery and retroperitoneum. Findings are consistent with bowel perforation, likely secondary to stercoral ulceration. An obstipating mass of the distal sigmoid would be difficult to exclude and could be further  investigated by colonoscopy. 2. Small volume pneumoperitoneum and fluid in the right lower quadrant. These results were called by telephone at the time of interpretation on 12/21/2020 at 6:14 pm to PA [facility] , who verbally acknowledged these results. Aortic Atherosclerosis (ICD10-I70.0). Electronically Signed   By: Eddie Candle M.D.   On: 12/21/2020 18:14   DG Chest Port 1 View  Result Date: 12/22/2020 CLINICAL DATA:  Endotracheal tube placement. EXAM: PORTABLE CHEST 1 VIEW COMPARISON:  Chest radiograph 06/06/2020, included lung bases from abdominal CT earlier today. FINDINGS: Endotracheal tube tip 13 mm from the carina. Tip and side port of the enteric tube below the diaphragm in the stomach. Right central line tip in the right atrium. Heart is normal in size. Curvilinear lucency abutting the right heart border corresponds to free air under the hemidiaphragm. Bibasilar atelectasis, increased. No pneumothorax. No significant pleural effusion. IMPRESSION: 1. Endotracheal tube tip 13 mm from the carina. Enteric tube tip and side port below the diaphragm in the stomach. 2. Right central line tip in the  right atrium. 3. Increased bibasilar atelectasis. 4. Known free air in the upper abdomen characterized on CT. Electronically Signed   By: Keith Rake M.D.   On: 12/22/2020 00:11    DVT Prophylaxis -SCD /heparin AM Labs Ordered, also please review Full Orders  Code Status - Full Code  Likely DC to home after resolution of acute surgical issues  Condition   stable  Roxan Hockey M.D on 12/22/2020 at 4:43 PM Go to [email or web address] -  for contact info  [facility] - Office  [phone]

## 2020-12-22 NOTE — Progress Notes (Signed)
Changed dressing on abdomen as ordered

## 2020-12-22 NOTE — Progress Notes (Addendum)
[facility] Progress Note  1 Day Post-Op  Subjective: Doing fair but on small dose levophed. Looks comfortable. Ostomy pink. Dr. Denton Brick helping with patient.   Objective: Vital signs in last 24 hours: Temp:  [96.9 F (36.1 C)-99.8 F (37.7 C)] 97.5 F (36.4 C) (02/13 1138) Pulse Rate:  [80-108] 91 (02/13 1138) Resp:  [10-28] 16 (02/13 1138) BP: (103-164)/(49-138) 109/49 (02/13 0900) SpO2:  [90 %-99 %] 98 % (02/13 1138) Arterial Line BP: (149)/(76) 149/76 (02/12 2326) FiO2 (%):  [40 %] 40 % (02/13 0942) Weight:  [77.1 kg-90.4 kg] 90.4 kg (02/12 2341) Last BM Date:  (unsure patient is intubated and unable to answer at this time)  Intake/Output from previous day: 02/12 0701 - 02/13 0700 In: 4870 [I.V.:4670.1; IV Piggyback:199.8] Out: 700 [Urine:650; Blood:50] Intake/Output this shift: Total I/O In: 125.8 [I.V.:125.8] Out: -   General appearance: no distress and sedated Resp: vented, comfortable GI: soft, distended, midline with dressing, ostomy pink with some swelling, no stool in bag or gas  Lab Results:  Recent Labs    12/21/20 1650 12/22/20 0339  WBC 13.8* 10.2  HGB 14.0 12.7  HCT 43.3 38.6  PLT 266 214   BMET Recent Labs    12/21/20 1650 12/22/20 0339  NA 136 137  K 3.6 3.1*  CL 98 96*  CO2 29 29  GLUCOSE 133* 148*  BUN 20 16  CREATININE 0.63 0.71  CALCIUM 9.0 8.1*   PT/INR No results for input(s): LABPROT, INR in the last 72 hours.  Studies/Results: CT ABDOMEN PELVIS W CONTRAST  Result Date: 12/21/2020 CLINICAL DATA:  Abdominal distension, bowel obstruction suspected EXAM: CT ABDOMEN AND PELVIS WITH CONTRAST TECHNIQUE: Multidetector CT imaging of the abdomen and pelvis was performed using the standard protocol following bolus administration of intravenous contrast. CONTRAST:  171mL OMNIPAQUE IOHEXOL 300 MG/ML  SOLN COMPARISON:  11/15/2015 FINDINGS: Lower chest: No acute abnormality. Hepatobiliary: No focal liver abnormality is  seen. Status post cholecystectomy. Mild postoperative biliary dilatation. Pancreas: Unremarkable. No pancreatic ductal dilatation or surrounding inflammatory changes. Spleen: Normal in size without significant abnormality. Adrenals/Urinary Tract: Adrenal glands are unremarkable. Kidneys are normal, without renal calculi, solid lesion, or hydronephrosis. Bladder is unremarkable. Stomach/Bowel: The stomach and lower esophagus are fluid-filled. Appendix appears normal in a retrocecal lie. There is a moderate burden of stool throughout the colon, with a large stool ball in the distal sigmoid colon and adjacent extraluminal air (series 2, image 68). Vascular/Lymphatic: Aortic atherosclerosis. No enlarged abdominal or pelvic lymph nodes. Reproductive: Status post hysterectomy. Other: No abdominal wall hernia or abnormality. Small volume pneumoperitoneum and retroperitoneal emphysema. Trace fluid in the right lower quadrant. Musculoskeletal: No acute or significant osseous findings. IMPRESSION: 1. There is a moderate burden of stool throughout the colon, with a large stool ball in the distal sigmoid colon and adjacent extraluminal air throughout the mesentery and retroperitoneum. Findings are consistent with bowel perforation, likely secondary to stercoral ulceration. An obstipating mass of the distal sigmoid would be difficult to exclude and could be further investigated by colonoscopy. 2. Small volume pneumoperitoneum and fluid in the right lower quadrant. These results were called by telephone at the time of interpretation on 12/21/2020 at 6:14 pm to PA [facility] , who verbally acknowledged these results. Aortic Atherosclerosis (ICD10-I70.0). Electronically Signed   By: Eddie Candle M.D.   On: 12/21/2020 18:14   DG Chest Port 1 View  Result Date: 12/22/2020 CLINICAL DATA:  Endotracheal tube placement. EXAM: PORTABLE CHEST 1 VIEW COMPARISON:  Chest radiograph 06/06/2020, included lung bases from abdominal CT  earlier today. FINDINGS: Endotracheal tube tip 13 mm from the carina. Tip and side port of the enteric tube below the diaphragm in the stomach. Right central line tip in the right atrium. Heart is normal in size. Curvilinear lucency abutting the right heart border corresponds to free air under the hemidiaphragm. Bibasilar atelectasis, increased. No pneumothorax. No significant pleural effusion. IMPRESSION: 1. Endotracheal tube tip 13 mm from the carina. Enteric tube tip and side port below the diaphragm in the stomach. 2. Right central line tip in the right atrium. 3. Increased bibasilar atelectasis. 4. Known free air in the upper abdomen characterized on CT. Electronically Signed   By: Keith Rake M.D.   On: 12/22/2020 00:11    Anti-infectives: Anti-infectives (From admission, onward)   Start     Dose/Rate Route Frequency Ordered Stop   12/22/20 0600  cefoTEtan (CEFOTAN) 2 g in sodium chloride 0.9 % 100 mL IVPB        2 g 200 mL/hr over 30 Minutes Intravenous On call to O.R. 12/21/20 1932 12/22/20 0630   12/22/20 0200  piperacillin-tazobactam (ZOSYN) IVPB 3.375 g        3.375 g 12.5 mL/hr over 240 Minutes Intravenous Every 8 hours 12/21/20 2339 12/27/20 0159   12/21/20 1830  piperacillin-tazobactam (ZOSYN) IVPB 3.375 g        3.375 g 100 mL/hr over 30 Minutes Intravenous  Once 12/21/20 1829 12/21/20 1933      Assessment/Plan: Ms. Swalley is a 60 yo with stercoral ulcer perforation s/p Hartman's procedure with end colostomy. Doing fair. PRN for pain, propofol sedation Respiratory following and will consult pulmonary critical care tomorrow  HD ok, on levo 4 mcg NPO, NG, ostomy awaiting function Ostomy RN ordered Labs in AM Zosyn post op for 5 days  Foley for critical I/O, adequate urine, K replaced  SCDs, heparin sq  A line an central line, central line a little deep in atria but no arrhythmias or tachycardia noted   Updated Husband.    LOS: 1 day    Virl Cagey 12/22/2020

## 2020-12-23 ENCOUNTER — Inpatient Hospital Stay (HOSPITAL_COMMUNITY): Payer: Medicare HMO

## 2020-12-23 DIAGNOSIS — A419 Sepsis, unspecified organism: Secondary | ICD-10-CM

## 2020-12-23 DIAGNOSIS — J95821 Acute postprocedural respiratory failure: Secondary | ICD-10-CM

## 2020-12-23 DIAGNOSIS — J690 Pneumonitis due to inhalation of food and vomit: Secondary | ICD-10-CM

## 2020-12-23 DIAGNOSIS — R6521 Severe sepsis with septic shock: Secondary | ICD-10-CM

## 2020-12-23 DIAGNOSIS — K633 Ulcer of intestine: Secondary | ICD-10-CM | POA: Diagnosis not present

## 2020-12-23 DIAGNOSIS — K631 Perforation of intestine (nontraumatic): Secondary | ICD-10-CM | POA: Diagnosis not present

## 2020-12-23 HISTORY — DX: Sepsis, unspecified organism: A41.9

## 2020-12-23 LAB — CBC
HCT: 29.4 % — ABNORMAL LOW (ref 36.0–46.0)
Hemoglobin: 9.6 g/dL — ABNORMAL LOW (ref 12.0–15.0)
MCH: 29.1 pg (ref 26.0–34.0)
MCHC: 32.7 g/dL (ref 30.0–36.0)
MCV: 89.1 fL (ref 80.0–100.0)
Platelets: 147 10*3/uL — ABNORMAL LOW (ref 150–400)
RBC: 3.3 MIL/uL — ABNORMAL LOW (ref 3.87–5.11)
RDW: 13.6 % (ref 11.5–15.5)
WBC: 12.3 10*3/uL — ABNORMAL HIGH (ref 4.0–10.5)
nRBC: 0 % (ref 0.0–0.2)

## 2020-12-23 LAB — COMPREHENSIVE METABOLIC PANEL
ALT: 45 U/L — ABNORMAL HIGH (ref 0–44)
AST: 32 U/L (ref 15–41)
Albumin: 2.2 g/dL — ABNORMAL LOW (ref 3.5–5.0)
Alkaline Phosphatase: 72 U/L (ref 38–126)
Anion gap: 8 (ref 5–15)
BUN: 12 mg/dL (ref 8–23)
CO2: 31 mmol/L (ref 22–32)
Calcium: 7.8 mg/dL — ABNORMAL LOW (ref 8.9–10.3)
Chloride: 96 mmol/L — ABNORMAL LOW (ref 98–111)
Creatinine, Ser: 0.67 mg/dL (ref 0.44–1.00)
GFR, Estimated: >60 mL/min (ref >60)
Glucose, Bld: 136 mg/dL — ABNORMAL HIGH (ref 70–99)
Potassium: 2.8 mmol/L — ABNORMAL LOW (ref 3.5–5.1)
Sodium: 135 mmol/L (ref 135–145)
Total Bilirubin: 1.3 mg/dL — ABNORMAL HIGH (ref 0.3–1.2)
Total Protein: 4.9 g/dL — ABNORMAL LOW (ref 6.5–8.1)

## 2020-12-23 LAB — GLUCOSE, CAPILLARY
Glucose-Capillary: 109 mg/dL — ABNORMAL HIGH (ref 70–99)
Glucose-Capillary: 133 mg/dL — ABNORMAL HIGH (ref 70–99)
Glucose-Capillary: 101 mg/dL — ABNORMAL HIGH (ref 70–99)

## 2020-12-23 MED ORDER — HYDROMORPHONE HCL 1 MG/ML IJ SOLN
1.0000 mg | INTRAMUSCULAR | Status: DC | PRN
  Administered 2020-12-23 – 2020-12-27 (×15): 1 mg via INTRAVENOUS
  Filled 2020-12-23 (×15): qty 1

## 2020-12-23 MED ORDER — OXYCODONE HCL 5 MG PO TABS
5.0000 mg | ORAL_TABLET | ORAL | Status: DC | PRN
  Administered 2020-12-23: 10 mg via ORAL
  Administered 2020-12-23 (×2): 5 mg via ORAL
  Administered 2020-12-24 (×3): 10 mg via ORAL
  Administered 2020-12-24: 5 mg via ORAL
  Administered 2020-12-25 (×3): 10 mg via ORAL
  Administered 2020-12-25 – 2020-12-26 (×2): 5 mg via ORAL
  Administered 2020-12-27: 10 mg via ORAL
  Administered 2020-12-27: 5 mg via ORAL
  Administered 2020-12-28 (×2): 10 mg via ORAL
  Filled 2020-12-23: qty 1
  Filled 2020-12-23: qty 2
  Filled 2020-12-23 (×2): qty 1
  Filled 2020-12-23: qty 2
  Filled 2020-12-23: qty 1
  Filled 2020-12-23 (×3): qty 2
  Filled 2020-12-23: qty 1
  Filled 2020-12-23: qty 2
  Filled 2020-12-23: qty 1
  Filled 2020-12-23 (×4): qty 2

## 2020-12-23 MED ORDER — POTASSIUM CHLORIDE 20 MEQ PO PACK
40.0000 meq | PACK | Freq: Two times a day (BID) | ORAL | Status: DC
  Administered 2020-12-23 – 2020-12-25 (×4): 40 meq
  Filled 2020-12-23 (×5): qty 2

## 2020-12-23 MED ORDER — POTASSIUM CHLORIDE 10 MEQ/100ML IV SOLN
10.0000 meq | INTRAVENOUS | Status: AC
  Administered 2020-12-23 (×4): 10 meq via INTRAVENOUS
  Filled 2020-12-23 (×4): qty 100

## 2020-12-23 NOTE — Progress Notes (Signed)
[facility] Progress Note  2 Days Post-Op  Subjective: Doing fair. Extubated. Ostomy with bowel sweat. Off pressors. Confused.   Objective: Vital signs in last 24 hours: Temp:  [97.7 F (36.5 C)-98.9 F (37.2 C)] 98.2 F (36.8 C) (02/14 1100) Pulse Rate:  [86-100] 98 (02/14 1100) Resp:  [13-23] 13 (02/14 1100) BP: (95-173)/(39-87) 122/72 (02/14 1100) SpO2:  [96 %-100 %] 97 % (02/14 1100) FiO2 (%):  [40 %] 40 % (02/14 0802) Last BM Date:  (unsure patient is intubated and unable to answer at this time)  Intake/Output from previous day: 02/13 0701 - 02/14 0700 In: 3482.3 [I.V.:3041.8; IV Piggyback:440.5] Out: 1350 [Urine:1350] Intake/Output this shift: Total I/O In: 546.4 [I.V.:433; IV Piggyback:113.4] Out: 575 [Urine:575]  General appearance: alert, cooperative and no distress Resp: normal work of breathing GI: soft, distended, midline with dressing, ostomy pink and swollen, minor sweat in bag  Lab Results:  Recent Labs    12/22/20 0339 12/23/20 0411  WBC 10.2 12.3*  HGB 12.7 9.6*  HCT 38.6 29.4*  PLT 214 147*   BMET Recent Labs    12/22/20 0339 12/23/20 0411  NA 137 135  K 3.1* 2.8*  CL 96* 96*  CO2 29 31  GLUCOSE 148* 136*  BUN 16 12  CREATININE 0.71 0.67  CALCIUM 8.1* 7.8*   PT/INR No results for input(s): LABPROT, INR in the last 72 hours.  Studies/Results: CT ABDOMEN PELVIS W CONTRAST  Result Date: 12/21/2020 CLINICAL DATA:  Abdominal distension, bowel obstruction suspected EXAM: CT ABDOMEN AND PELVIS WITH CONTRAST TECHNIQUE: Multidetector CT imaging of the abdomen and pelvis was performed using the standard protocol following bolus administration of intravenous contrast. CONTRAST:  158mL OMNIPAQUE IOHEXOL 300 MG/ML  SOLN COMPARISON:  11/15/2015 FINDINGS: Lower chest: No acute abnormality. Hepatobiliary: No focal liver abnormality is seen. Status post cholecystectomy. Mild postoperative biliary dilatation. Pancreas: Unremarkable. No  pancreatic ductal dilatation or surrounding inflammatory changes. Spleen: Normal in size without significant abnormality. Adrenals/Urinary Tract: Adrenal glands are unremarkable. Kidneys are normal, without renal calculi, solid lesion, or hydronephrosis. Bladder is unremarkable. Stomach/Bowel: The stomach and lower esophagus are fluid-filled. Appendix appears normal in a retrocecal lie. There is a moderate burden of stool throughout the colon, with a large stool ball in the distal sigmoid colon and adjacent extraluminal air (series 2, image 68). Vascular/Lymphatic: Aortic atherosclerosis. No enlarged abdominal or pelvic lymph nodes. Reproductive: Status post hysterectomy. Other: No abdominal wall hernia or abnormality. Small volume pneumoperitoneum and retroperitoneal emphysema. Trace fluid in the right lower quadrant. Musculoskeletal: No acute or significant osseous findings. IMPRESSION: 1. There is a moderate burden of stool throughout the colon, with a large stool ball in the distal sigmoid colon and adjacent extraluminal air throughout the mesentery and retroperitoneum. Findings are consistent with bowel perforation, likely secondary to stercoral ulceration. An obstipating mass of the distal sigmoid would be difficult to exclude and could be further investigated by colonoscopy. 2. Small volume pneumoperitoneum and fluid in the right lower quadrant. These results were called by telephone at the time of interpretation on 12/21/2020 at 6:14 pm to PA [facility] , who verbally acknowledged these results. Aortic Atherosclerosis (ICD10-I70.0). Electronically Signed   By: Eddie Candle M.D.   On: 12/21/2020 18:14   DG CHEST PORT 1 VIEW  Result Date: 12/23/2020 CLINICAL DATA:  Dyspnea. EXAM: PORTABLE CHEST 1 VIEW COMPARISON:  December 22, 2020. FINDINGS: Endotracheal tube tip approximately 13 mm above the carina. Right IJ central venous catheter tip projecting at the  right atrium. Gastric tube courses below the  diaphragm outside the field of view. Free air seen on prior radiograph and on the prior CT is less conspicuous on this exam. Similar streaky bibasilar opacities with low lung volumes. No new confluent consolidation. No visible pleural effusions or pneumothorax. Similar cardiomediastinal silhouette. Aortic atherosclerosis. Partially imaged ACDF. IMPRESSION: 1. Endotracheal tube tip approximately 13 mm above the carina. Retraction by approximately 2.5 cm would place the tip at the level of the clavicular heads. 2. Similar streaky bibasilar opacities, likely atelectasis. 3. Free air seen on prior radiograph and on the prior CT is less conspicuous on this exam. Electronically Signed   By: Margaretha Sheffield MD   On: 12/23/2020 07:50   DG Chest Port 1 View  Result Date: 12/22/2020 CLINICAL DATA:  Endotracheal tube placement. EXAM: PORTABLE CHEST 1 VIEW COMPARISON:  Chest radiograph 06/06/2020, included lung bases from abdominal CT earlier today. FINDINGS: Endotracheal tube tip 13 mm from the carina. Tip and side port of the enteric tube below the diaphragm in the stomach. Right central line tip in the right atrium. Heart is normal in size. Curvilinear lucency abutting the right heart border corresponds to free air under the hemidiaphragm. Bibasilar atelectasis, increased. No pneumothorax. No significant pleural effusion. IMPRESSION: 1. Endotracheal tube tip 13 mm from the carina. Enteric tube tip and side port below the diaphragm in the stomach. 2. Right central line tip in the right atrium. 3. Increased bibasilar atelectasis. 4. Known free air in the upper abdomen characterized on CT. Electronically Signed   By: Keith Rake M.D.   On: 12/22/2020 00:11    Anti-infectives: Anti-infectives (From admission, onward)   Start     Dose/Rate Route Frequency Ordered Stop   12/22/20 0600  cefoTEtan (CEFOTAN) 2 g in sodium chloride 0.9 % 100 mL IVPB        2 g 200 mL/hr over 30 Minutes Intravenous On call to O.R.  12/21/20 1932 12/22/20 0630   12/22/20 0200  piperacillin-tazobactam (ZOSYN) IVPB 3.375 g        3.375 g 12.5 mL/hr over 240 Minutes Intravenous Every 8 hours 12/21/20 2339 12/27/20 0159   12/21/20 1830  piperacillin-tazobactam (ZOSYN) IVPB 3.375 g        3.375 g 100 mL/hr over 30 Minutes Intravenous  Once 12/21/20 1829 12/21/20 1933      Assessment/Plan: [name] is a 60 yo with stercoral ulcer perforation s/p Hartman's procedure with end colostomy.  Extubated. Doing fair.  Roxi added for moderate pain and dilaudid for severe NG accidentally out, will do sips and meds now and hold until bowel function Ostomy in place, RN coming today Labs in AM Zosyn for 5 days post op, ending 2/17 Arterial line out Foley can likely come out will discuss with Dr. Denton Brick    LOS: 2 days    [name] 12/23/2020

## 2020-12-23 NOTE — Progress Notes (Signed)
Patient Demographics:    [name], is a 60 y.o. female, DOB - 1961/01/01, VUY:[id number]  Admit date - 12/21/2020   Admitting Physician Virl Cagey, MD  Outpatient Primary MD for the patient is [name], DO  LOS - 2   No chief complaint on file.       Subjective:    [name] today has no fevers, no emesis,  No chest pain,   -Husband at bedside, -Patient successfully extubated  Assessment  & Plan :    Principal Problem:   Sepsis -due to fecal peritonitis Active Problems:   Colon perforation due to stercoral ulcer with fecal peritonitis   Stercoral ulcer of large intestine   Aspiration pneumonia of both lower lobes due to gastric secretions [facility])   Depression   Essential hypertension   Current smoker   Under care of pain management specialist   Brief Summary:- 60 y.o. female with past medical history relevant for HTN, depression/anxiety and chronic pain syndrome, HLD and obesity admitted on 12/21/2020 with with stercoral ulcer perforation --- underwent colectomy with s/p Hartman's procedure with end colostomy on 12/21/20--with aspiration episode intraoperatively, remains hypotensive requiring pressors - Dr. Constance Haw general surgeon requested hospitalist medicine input for management of ongoing medical issues including concerns for aspiration, respiratory failure, hemodynamic instability requiring Levophed for pressure support -On admission patient did meet sepsis criteria  A/p 1) sepsis secondary to fecal peritonitis in the setting of stercoral ulcer-- -hemodynamically improved, off Levophed -Respiratory status improved, extubated on 12/23/2020 -Continue IV Zosyn and IV fluids -Repeat chest x-ray on 12/23/2020 without acute findings specifically no evidence for definite aspiration pneumonia -patient met sepsis criteria on admission with tachycardia, tachypnea,  leukocytosis and bowel perforation with fecal peritonitis   2)Stercoral Ulcer perforation with fecal peritonitis and sepsis ---s/p Hartman's procedure with end colostomy-on 12/21/2020 -Postop care per general surgeon -Please see #8 below  3)Hypotension/hemodynamic instability--- suspect due to #1 and #2 above, improved, off Levophed, -IV fluids as ordered  4)Depression/Anxiety/chronic Pain syndrome- --restart Celexa, BuSpar, trazodone and Elavil -We will start oral opiates to avoid opiate withdrawal--- PTA patient was on chronic opiates  5)HTN---  start atenolol as BP improves -Hold lisinopril HCTZ, hold hydralazine   6) social/ethics--- patient is a full code  7)HLD-- restart Crestor  8)Hypokalemia/hypomagnesemia--- potassium was down to 3.1, Mg was 1.4----replaced IV, will recheck -Okay to give additional potassium replacement  9) elevated LFTs--- in the setting of shock liver, monitor closely  --- Prophylaxis--IV Protonix for GI prophylaxis and subcu heparin for DVT prophylaxis  Disposition/Need for in-Hospital Stay- patient unable to be discharged at this time due to -sepsis requiring IV antibiotics and IV fluids*  Status is: Inpatient  Remains inpatient appropriate because:Above   Disposition: The patient is from: home              Anticipated d/c is to: Home              Anticipated d/c date is: > 3 days              Patient currently is not medically stable to d/c. Barriers: Not Clinically Stable-   Code Status :  -  Code Status: Full Code   Family Communication:  NA (patient is alert, awake and coherent)   Consults  : General surgery as primary team -Procedures/Lines -Arterial line -Right IJ central line -Foley catheter Intubated 12/21/2020, extubated 12/23/2020  DVT Prophylaxis  :   - SCDs  heparin injection 5,000 Units Start: 12/22/20 0030 SCDs Start: 12/21/20 2340    Lab Results  Component Value Date   PLT 147 (L) 12/23/2020     Inpatient Medications  Scheduled Meds: . Chlorhexidine Gluconate Cloth  6 each Topical Daily  . heparin  5,000 Units Subcutaneous Q8H  . metoprolol tartrate  5 mg Intravenous Q6H  . pantoprazole (PROTONIX) IV  40 mg Intravenous Q24H  . potassium chloride  40 mEq Per Tube BID   Continuous Infusions: . sodium chloride    . lactated ringers 75 mL/hr at 12/23/20 1232  . piperacillin-tazobactam (ZOSYN)  IV 12.5 mL/hr at 12/23/20 1232  . potassium chloride 10 mEq (12/23/20 1328)   PRN Meds:.diphenhydrAMINE **OR** diphenhydrAMINE, HYDROmorphone (DILAUDID) injection, metoprolol tartrate, ondansetron **OR** ondansetron (ZOFRAN) IV, oxyCODONE    Anti-infectives (From admission, onward)   Start     Dose/Rate Route Frequency Ordered Stop   12/22/20 0600  cefoTEtan (CEFOTAN) 2 g in sodium chloride 0.9 % 100 mL IVPB        2 g 200 mL/hr over 30 Minutes Intravenous On call to O.R. 12/21/20 1932 12/22/20 0630   12/22/20 0200  piperacillin-tazobactam (ZOSYN) IVPB 3.375 g        3.375 g 12.5 mL/hr over 240 Minutes Intravenous Every 8 hours 12/21/20 2339 12/27/20 0159   12/21/20 1830  piperacillin-tazobactam (ZOSYN) IVPB 3.375 g        3.375 g 100 mL/hr over 30 Minutes Intravenous  Once 12/21/20 1829 12/21/20 1933        Objective:   Vitals:   12/23/20 1056 12/23/20 1100 12/23/20 1200 12/23/20 1300  BP:  122/72 129/65 139/69  Pulse:  98 95 92  Resp:  13 (!) 24 20  Temp:  98.2 F (36.8 C)    TempSrc:  Oral    SpO2: 97% 97% 97% 100%  Weight:      Height:        Wt Readings from Last 3 Encounters:  12/21/20 90.4 kg  09/06/20 79.2 kg  08/16/20 78.9 kg     Intake/Output Summary (Last 24 hours) at 12/23/2020 1401 Last data filed at 12/23/2020 1232 Gross per 24 hour  Intake 4129.2 ml  Output 1925 ml  Net 2204.2 ml   Physical Exam  Physical Examination:  General appearance awake and somewhat lethargic  Nose- [place] 2L/min Chest -diminished in bases, no wheezing Heart - S1 and  S2 normal, regular  Abdomen -postop wound dressing intact, left lower quadrant with colostomy with scant sanguious content, diminished bowel sounds Neuro/Psych--affect is flat, no gross new focal deficit  extremities - no pedal edema noted, intact peripheral pulses * Skin - warm, dry GU-Foley catheter MSK-arterial line   Data Review:   Micro Results Recent Results (from the past 240 hour(s))  Resp Panel by RT-PCR (Flu A&B, Covid) Nasopharyngeal Swab     Status: None   Collection Time: 12/21/20  6:13 PM   Specimen: Nasopharyngeal Swab; Nasopharyngeal(NP) swabs in vial transport medium  Result Value Ref Range Status   SARS Coronavirus 2 by RT PCR NEGATIVE NEGATIVE Final    Comment: (NOTE) SARS-CoV-2 target nucleic acids are NOT DETECTED.  The SARS-CoV-2 RNA is generally detectable in upper respiratory specimens during the acute phase of infection. The lowest  concentration of SARS-CoV-2 viral copies this assay can detect is 138 copies/mL. A negative result does not preclude SARS-Cov-2 infection and should not be used as the sole basis for treatment or other patient management decisions. A negative result may occur with  improper specimen collection/handling, submission of specimen other than nasopharyngeal swab, presence of viral mutation(s) within the areas targeted by this assay, and inadequate number of viral copies(<138 copies/mL). A negative result must be combined with clinical observations, patient history, and epidemiological information. The expected result is Negative.  Fact Sheet for Patients:  EntrepreneurPulse.com.au  Fact Sheet for Healthcare Providers:  IncredibleEmployment.be  This test is no t yet approved or cleared by the [place] FDA and  has been authorized for detection and/or diagnosis of SARS-CoV-2 by FDA under an Emergency Use Authorization (EUA). This EUA will remain  in effect (meaning this test can be used) for  the duration of the COVID-19 declaration under Section 564(b)(1) of the Act, 21 U.S.C.section 360bbb-3(b)(1), unless the authorization is terminated  or revoked sooner.       Influenza A by PCR NEGATIVE NEGATIVE Final   Influenza B by PCR NEGATIVE NEGATIVE Final    Comment: (NOTE) The Xpert Xpress SARS-CoV-2/FLU/RSV plus assay is intended as an aid in the diagnosis of influenza from Nasopharyngeal swab specimens and should not be used as a sole basis for treatment. Nasal washings and aspirates are unacceptable for Xpert Xpress SARS-CoV-2/FLU/RSV testing.  Fact Sheet for Patients: EntrepreneurPulse.com.au  Fact Sheet for Healthcare Providers: IncredibleEmployment.be  This test is not yet approved or cleared by the [place] FDA and has been authorized for detection and/or diagnosis of SARS-CoV-2 by FDA under an Emergency Use Authorization (EUA). This EUA will remain in effect (meaning this test can be used) for the duration of the COVID-19 declaration under Section 564(b)(1) of the Act, 21 U.S.C. section 360bbb-3(b)(1), unless the authorization is terminated or revoked.  Performed at [facility], [address], [place], [place] [address]   Aerobic/Anaerobic Culture (surgical/deep wound)     Status: None (Preliminary result)   Collection Time: 12/21/20 11:37 PM   Specimen: PATH Other; Body Fluid  Result Value Ref Range Status   Specimen Description   Final    FLUID Performed at [facility], [address] [place], [place] [address]    Special Requests   Final    ABDOMINAL Performed at [facility], [address] [place], [place] [address]    Gram Stain   Final    FEW WBC PRESENT,BOTH PMN AND MONONUCLEAR FEW GRAM NEGATIVE RODS RARE GRAM POSITIVE COCCI IN PAIRS Performed at [facility], [place] [address], [place], [place] [address]    Culture RARE GRAM NEGATIVE RODS  Final   Report Status PENDING  Incomplete    Radiology  Reports CT ABDOMEN PELVIS W CONTRAST  Result Date: 12/21/2020 CLINICAL DATA:  Abdominal distension, bowel obstruction suspected EXAM: CT ABDOMEN AND PELVIS WITH CONTRAST TECHNIQUE: Multidetector CT imaging of the abdomen and pelvis was performed using the standard protocol following bolus administration of intravenous contrast. CONTRAST:  156m OMNIPAQUE IOHEXOL 300 MG/ML  SOLN COMPARISON:  11/15/2015 FINDINGS: Lower chest: No acute abnormality. Hepatobiliary: No focal liver abnormality is seen. Status post cholecystectomy. Mild postoperative biliary dilatation. Pancreas: Unremarkable. No pancreatic ductal dilatation or surrounding inflammatory changes. Spleen: Normal in size without significant abnormality. Adrenals/Urinary Tract: Adrenal glands are unremarkable. Kidneys are normal, without renal calculi, solid lesion, or hydronephrosis. Bladder is unremarkable. Stomach/Bowel: The stomach and lower  esophagus are fluid-filled. Appendix appears normal in a retrocecal lie. There is a moderate burden of stool throughout the colon, with a large stool ball in the distal sigmoid colon and adjacent extraluminal air (series 2, image 68). Vascular/Lymphatic: Aortic atherosclerosis. No enlarged abdominal or pelvic lymph nodes. Reproductive: Status post hysterectomy. Other: No abdominal wall hernia or abnormality. Small volume pneumoperitoneum and retroperitoneal emphysema. Trace fluid in the right lower quadrant. Musculoskeletal: No acute or significant osseous findings. IMPRESSION: 1. There is a moderate burden of stool throughout the colon, with a large stool ball in the distal sigmoid colon and adjacent extraluminal air throughout the mesentery and retroperitoneum. Findings are consistent with bowel perforation, likely secondary to stercoral ulceration. An obstipating mass of the distal sigmoid would be difficult to exclude and could be further investigated by colonoscopy. 2. Small volume pneumoperitoneum and fluid in  the right lower quadrant. These results were called by telephone at the time of interpretation on 12/21/2020 at 6:14 pm to PA [facility] , who verbally acknowledged these results. Aortic Atherosclerosis (ICD10-I70.0). Electronically Signed   By: Eddie Candle M.D.   On: 12/21/2020 18:14   DG CHEST PORT 1 VIEW  Result Date: 12/23/2020 CLINICAL DATA:  Dyspnea. EXAM: PORTABLE CHEST 1 VIEW COMPARISON:  December 22, 2020. FINDINGS: Endotracheal tube tip approximately 13 mm above the carina. Right IJ central venous catheter tip projecting at the right atrium. Gastric tube courses below the diaphragm outside the field of view. Free air seen on prior radiograph and on the prior CT is less conspicuous on this exam. Similar streaky bibasilar opacities with low lung volumes. No new confluent consolidation. No visible pleural effusions or pneumothorax. Similar cardiomediastinal silhouette. Aortic atherosclerosis. Partially imaged ACDF. IMPRESSION: 1. Endotracheal tube tip approximately 13 mm above the carina. Retraction by approximately 2.5 cm would place the tip at the level of the clavicular heads. 2. Similar streaky bibasilar opacities, likely atelectasis. 3. Free air seen on prior radiograph and on the prior CT is less conspicuous on this exam. Electronically Signed   By: Margaretha Sheffield MD   On: 12/23/2020 07:50   DG Chest Port 1 View  Result Date: 12/22/2020 CLINICAL DATA:  Endotracheal tube placement. EXAM: PORTABLE CHEST 1 VIEW COMPARISON:  Chest radiograph 06/06/2020, included lung bases from abdominal CT earlier today. FINDINGS: Endotracheal tube tip 13 mm from the carina. Tip and side port of the enteric tube below the diaphragm in the stomach. Right central line tip in the right atrium. Heart is normal in size. Curvilinear lucency abutting the right heart border corresponds to free air under the hemidiaphragm. Bibasilar atelectasis, increased. No pneumothorax. No significant pleural effusion. IMPRESSION:  1. Endotracheal tube tip 13 mm from the carina. Enteric tube tip and side port below the diaphragm in the stomach. 2. Right central line tip in the right atrium. 3. Increased bibasilar atelectasis. 4. Known free air in the upper abdomen characterized on CT. Electronically Signed   By: Keith Rake M.D.   On: 12/22/2020 00:11     CBC Recent Labs  Lab 12/21/20 1650 12/22/20 0339 12/23/20 0411  WBC 13.8* 10.2 12.3*  HGB 14.0 12.7 9.6*  HCT 43.3 38.6 29.4*  PLT 266 214 147*  MCV 88.0 88.7 89.1  MCH 28.5 29.2 29.1  MCHC 32.3 32.9 32.7  RDW 13.2 13.2 13.6  LYMPHSABS 0.9 1.0  --   MONOABS 0.7 0.5  --   EOSABS 0.1 0.0  --   BASOSABS 0.1 0.0  --  Chemistries  Recent Labs  Lab 12/21/20 1650 12/22/20 0339 12/23/20 0411  NA 136 137 135  K 3.6 3.1* 2.8*  CL 98 96* 96*  CO2 '29 29 31  ' GLUCOSE 133* 148* 136*  BUN '20 16 12  ' CREATININE 0.63 0.71 0.67  CALCIUM 9.0 8.1* 7.8*  MG  --  1.4*  --   AST 93* 63* 32  ALT 86* 77* 45*  ALKPHOS 115 79 72  BILITOT 0.8 1.2 1.3*   ------------------------------------------------------------------------------------------------------------------ Recent Labs    12/22/20 0341  TRIG 104    Lab Results  Component Value Date   HGBA1C 5.6 05/30/2020   ------------------------------------------------------------------------------------------------------------------ No results for input(s): TSH, T4TOTAL, T3FREE, THYROIDAB in the last 72 hours.  Invalid input(s): FREET3 ------------------------------------------------------------------------------------------------------------------ No results for input(s): VITAMINB12, FOLATE, FERRITIN, TIBC, IRON, RETICCTPCT in the last 72 hours.  Coagulation profile No results for input(s): INR, PROTIME in the last 168 hours.  No results for input(s): DDIMER in the last 72 hours.  Cardiac Enzymes No results for input(s): CKMB, TROPONINI, MYOGLOBIN in the last 168 hours.  Invalid input(s):  CK ------------------------------------------------------------------------------------------------------------------ No results found for: BNP   Roxan Hockey M.D on 12/23/2020 at 2:01 PM  Go to [email or web address] - for contact info  [facility] - Office  [phone]

## 2020-12-23 NOTE — Procedures (Signed)
Extubation Procedure Note  Patient Details:   Name: [name] DOB: 1961-01-17 MRN: [id number]   Airway Documentation:    12/23/2020 1051  Evaluation  O2 sats: stable throughout Complications: No apparent complications Patient did tolerate procedure well. Bilateral Breath Sounds: Diminished   Yes    Patient extubated without any complications and placed on 3L [place]. Tolerating well at this time.   Blanchie Serve 12/23/2020, 10:50 AM

## 2020-12-23 NOTE — Consult Note (Signed)
NAME:  [name], MRN:  [id number], DOB:  [birth year], LOS: 2 ADMISSION DATE:  12/21/2020, CONSULTATION DATE:  12/23/2020  REFERRING MD:  TRH, Courage, CHIEF COMPLAINT: Postop vent management  Brief History:  60 year old woman with chronic pain on narcotics admitted 2/12 with colonic perforation/stercoral ulcer status post colectomy with Hartman's procedure, Intra-Op aspiration and postop hypotension requiring pressors.  PCCM consulted for vent management  History of Present Illness:  She was admitted 2/12 with abdominal pain, found to have pneumoperitoneum and possible colonic perforation taken emergently to the OR.  She had vomiting on intubation .  Underwent sigmoid resection, end colostomy, brought back to ICU intubated.  Postop hypotension requiring pressors.  Of note negative colonoscopy 2 years ago  Past Medical History:  Chronic pain on narcotics Depression/anxiety Hypertension Neck surgery x3, last 2010 Remote history of seizures  Significant Hospital Events:    Consults:  Surgery  Procedures:  2/12 Hartman's procedure with sigmoid resection and end colostomy Findings: Large colon perforation with stool, no obvious masses or thickening, likely stercoral ulcer   Significant Diagnostic Tests:  CT abdomen/pelvis 2/12 There is a moderate burden of stool throughout the colon, with a large stool ball in the distal sigmoid colon and adjacent extraluminal air throughout the mesentery and retroperitoneum. Findings are consistent with bowel perforation, likely secondary to stercoral ulceration.  Small volume pneumoperitoneum and fluid in the right lower quadrant.  Micro Data:  Peritoneal fluid culture 2/12 >> GNR >>  Antimicrobials:  2/12 Zosyn >>  Interim History / Subjective:    Objective   Blood pressure (!) 162/75, pulse 97, temperature 98.2 F (36.8 C), temperature source Oral, resp. rate 11, height 5\' 4"  (1.626 m), weight 90.4 kg, SpO2 97 %.    Vent Mode:  PRVC FiO2 (%):  [40 %] 40 % Set Rate:  [16 bmp] 16 bmp Vt Set:  [500 mL] 500 mL PEEP:  [5 cmH20] 5 cmH20 Pressure Support:  [10 cmH20] 10 cmH20 Plateau Pressure:  [19 cmH20-20 cmH20] 19 cmH20   Intake/Output Summary (Last 24 hours) at 12/23/2020 1515 Last data filed at 12/23/2020 1232 Gross per 24 hour  Intake 2940.49 ml  Output 1925 ml  Net 1015.49 ml   Filed Weights   12/21/20 1616 12/21/20 1622 12/21/20 2341  Weight: 77.1 kg 77.1 kg 90.4 kg    Examination: General: Obese middle-aged woman, sedated on propofol, orally intubated, no distress HENT: Mild pallor, no icterus, no JVD, short neck Lungs: Bilateral decreased breath sounds Cardiovascular: S1-S2 regular, no murmur Abdomen: Distended, mild tenderness diffusely, no guarding , colostomy pink Extremities: no edema Neuro: sedated ,RASS-3  CXR 2/14 >> bibasal streaky opacities, ET tube 13 mm above carina Labs show hypokalemia, mild leukocytosis, stable anemia  Resolved Hospital Problem list     Assessment & Plan:  Septic shock -resolving off Levophed this morning blood pressure in the 90s Peritonitis -continue Zosyn , resume at least 10 days of antibiotics for fecal peritonitis Defer to surgery about starting tube feeds/orals  Postop respiratory failure -wean sedation for wake-up assessment. Proceed with spontaneous breathing trial, she was weaned on pressure support and extubated to nasal cannula  Chronic pain/opiate use -she is on oxycodone 5 mg twice a day and this can be restarted. Can use morphine for breakthrough acute pain Obviously need to address her laxative regimen on discharge  Best practice (evaluated daily)  Diet: NPO Pain/Anxiety/Delirium protocol (if indicated): As above VAP protocol (if indicated): N/A DVT prophylaxis: Subcu Lovenox when okay with  surgery GI prophylaxis: Protonix Glucose control: SSI Mobility: Bedrest >> mobilize Disposition: ICU  Goals of Care:   Code Status: full  Labs    CBC: Recent Labs  Lab 12/21/20 1650 12/22/20 0339 12/23/20 0411  WBC 13.8* 10.2 12.3*  NEUTROABS 12.0* 8.6*  --   HGB 14.0 12.7 9.6*  HCT 43.3 38.6 29.4*  MCV 88.0 88.7 89.1  PLT 266 214 147*    Basic Metabolic Panel: Recent Labs  Lab 12/21/20 1650 12/22/20 0339 12/23/20 0411  NA 136 137 135  K 3.6 3.1* 2.8*  CL 98 96* 96*  CO2 29 29 31   GLUCOSE 133* 148* 136*  BUN 20 16 12   CREATININE 0.63 0.71 0.67  CALCIUM 9.0 8.1* 7.8*  MG  --  1.4*  --   PHOS  --  3.3  --    GFR: Estimated Creatinine Clearance: 80.4 mL/min (by C-G formula based on SCr of 0.67 mg/dL). Recent Labs  Lab 12/21/20 1650 12/22/20 0339 12/23/20 0411  WBC 13.8* 10.2 12.3*    Liver Function Tests: Recent Labs  Lab 12/21/20 1650 12/22/20 0339 12/23/20 0411  AST 93* 63* 32  ALT 86* 77* 45*  ALKPHOS 115 79 72  BILITOT 0.8 1.2 1.3*  PROT 6.7 4.9* 4.9*  ALBUMIN 3.6 2.6* 2.2*   Recent Labs  Lab 12/21/20 1650  LIPASE 20   No results for input(s): AMMONIA in the last 168 hours.  ABG    Component Value Date/Time   PHART 7.417 12/22/2020 0355   PCO2ART 43.6 12/22/2020 0355   PO2ART 117 (H) 12/22/2020 0355   HCO3 27.1 12/22/2020 0355   TCO2 36 07/14/2016 1638   O2SAT 97.8 12/22/2020 0355     Coagulation Profile: No results for input(s): INR, PROTIME in the last 168 hours.  Cardiac Enzymes: No results for input(s): CKTOTAL, CKMB, CKMBINDEX, TROPONINI in the last 168 hours.  HbA1C: HB A1C (BAYER DCA - WAIVED)  Date/Time Value Ref Range Status  05/30/2020 04:43 PM 5.6 <7.0 % Final    Comment:                                          Diabetic Adult            <7.0                                       Healthy Adult        4.3 - 5.7                                                           (DCCT/NGSP) American Diabetes Association's Summary of Glycemic Recommendations for Adults with Diabetes: Hemoglobin A1c <7.0%. More stringent glycemic goals (A1c <6.0%) may further reduce  complications at the cost of increased risk of hypoglycemia.    Hgb A1c MFr Bld  Date/Time Value Ref Range Status  05/16/2015 05:05 PM 6.2 (H) 4.8 - 5.6 % Final    Comment:             Pre-diabetes: 5.7 - 6.4          Diabetes: >  6.4          Glycemic control for adults with diabetes: <7.0   10/17/2014 01:32 PM 6.1 (H) 4.8 - 5.6 % Final    Comment:             Pre-diabetes: 5.7 - 6.4          Diabetes: >6.4          Glycemic control for adults with diabetes: <7.0     CBG: Recent Labs  Lab 12/22/20 0819 12/22/20 1111 12/22/20 1603 12/23/20 0806 12/23/20 1129  GLUCAP 117* 129* 121* 109* 133*    Review of Systems:   Unable to obtain due to intubated and sedated  Past Medical History:  She,  has a past medical history of Abdominal pain, Anxiety, Arthritis, Blood in stool, Breast changes, fibrocystic, Bruises easily, Cardiomegaly (2018), Clotting disorder ([place]), Constipation, DDD (degenerative disc disease), cervical, Depression, Generalized headaches, GERD (gastroesophageal reflux disease), Hemorrhoid, Hyperlipidemia, Hypertension, Idiopathic thrombocytopenia ([place]), Leg swelling, Liver lesion, Nasal congestion, Nausea & vomiting, Neuromuscular disorder ([place]), Neuromuscular disorder ([place]), Osteoporosis, Panic attacks, Rectal bleeding, Seizures ([place]) (1998), Seizures ([place]), Sleep apnea, Spleen enlarged (1976, none since), Trouble swallowing, Unintentional weight loss, and Weakness.   Surgical History:   Past Surgical History:  Procedure Laterality Date  . ABDOMINAL HYSTERECTOMY  1997  . BACK SURGERY    . CERVICAL SPINE SURGERY  2005, 2007, 2010   x3, fusion done,plates and screws present  . [place]  . CHOLECYSTECTOMY  03/14/2012   Procedure: LAPAROSCOPIC CHOLECYSTECTOMY;  Surgeon: Stark Klein, MD;  Location: [facility]S;  Service: General;  Laterality: N/A;  . colonscopy and esophagogastrodudononescopy  02-04-12  . LUMBAR [place] SURGERY  11/2014     Social  History:   reports that she quit smoking about 3 months ago. Her smoking use included cigarettes. She started smoking about 15 years ago. She has a 12.00 pack-year smoking history. She has never used smokeless tobacco. She reports current drug use. Drug: Marijuana. She reports that she does not drink alcohol.   Family History:  Her family history includes Bipolar disorder in her sister; Cancer in her father and paternal grandmother; Colon cancer in her paternal grandmother; Diabetes in her sister; Heart disease in her maternal grandmother, mother, and sister; Hypertension in her mother and sister; Lung cancer in her father.   Allergies Allergies  Allergen Reactions  . Nsaids Nausea Only    Abdominal pain     Home Medications  Prior to Admission medications   Medication Sig Start Date End Date Taking? Authorizing Provider  amitriptyline (ELAVIL) 25 MG tablet Take 25 mg by mouth at bedtime. 08/04/19  Yes [provider]  amoxicillin (AMOXIL) 500 MG capsule Take 500 mg by mouth daily. 12/16/20  Yes [provider]  atenolol (TENORMIN) 50 MG tablet Take 1 tablet (50 mg total) by mouth daily. 09/06/20  Yes Gottschalk, Ashly M, DO  busPIRone (BUSPAR) 15 MG tablet Take 1 tablet (15 mg total) by mouth 3 (three) times daily. 09/06/20  Yes Ronnie Doss M, DO  citalopram (CELEXA) 40 MG tablet Take 1 tablet (40 mg total) by mouth daily with breakfast. 09/06/20  Yes Gottschalk, Ashly M, DO  famotidine (PEPCID) 20 MG tablet Take 1 tablet (20 mg total) by mouth daily as needed for heartburn or indigestion. 08/16/20  Yes Milus Banister, MD  latanoprost (XALATAN) 0.005 % ophthalmic solution Place 1 drop into both eyes daily. 12/13/20  Yes [provider]  lisinopril-hydrochlorothiazide (  ZESTORETIC) 20-25 MG tablet TAKE 1 TABLET BY MOUTH EVERY DAY 10/18/20  Yes Branch, Alphonse Guild, MD  nitroGLYCERIN (NITROSTAT) 0.4 MG SL tablet Place 1 tablet (0.4 mg total) under the tongue every 5  (five) minutes as needed for chest pain. 05/30/20 08/28/20 Yes Gottschalk, Ashly M, DO  ondansetron (ZOFRAN) 4 MG tablet Take 4 mg by mouth every 8 (eight) hours as needed. for nausea 07/29/19  Yes [provider]  Oxycodone HCl 10 MG TABS Take 5 mg by mouth 2 (two) times daily.  08/20/17  Yes [provider]  rosuvastatin (CRESTOR) 20 MG tablet Take 1 tablet (20 mg total) by mouth daily. 06/03/20  Yes Gottschalk, Leatrice Jewels M, DO  tiZANidine (ZANAFLEX) 4 MG tablet Take 4 mg by mouth 3 (three) times daily. 10/17/20  Yes [provider]  traZODone (DESYREL) 100 MG tablet Take 1.5 tablets (150 mg total) by mouth at bedtime as needed for sleep. 05/30/20  Yes Ronnie Doss M, DO  hydrALAZINE (APRESOLINE) 50 MG tablet Take 1 tablet (50 mg total) by mouth 2 (two) times daily as needed. 05/20/20 08/18/20  Verta Ellen., NP  hydrochlorothiazide (HYDRODIURIL) 25 MG tablet Take 25 mg by mouth daily.  05/20/20  [provider]       Kara Mead MD. FCCP. [facility] Pager : [phone]  If no response to pager , please call [phone] until 7 pm After 7:00 pm call Elink  [phone]   12/23/2020

## 2020-12-23 NOTE — Consult Note (Signed)
[facility] Nurse ostomy consult note: Patient still in ICU. Stoma type/location: LLQ end colostomy created emergently by Dr. Constance Haw on 12/21/20. Patient crying loudly throughout procedure, somewhat confused.  Husband participates in session. Patient has an identical twin sister who lives with them who will be assisting her at home. They will require the services of a [facility] for continuation of ostomy teaching and for wound care. I reinforce that the [facility] will only come intermittently and that knowledge about and skill of pouch emptying will be required prior to discharge as it will need to be performed several times a day.   Stomal assessment/size: 1 and 5/8 inches round, raised, lumen in center with downward orientation Peristomal assessment: intact Treatment options for stomal/peristomal skin: skin barrier ring Output: serosanguinous  Ostomy pouching: 2pc. 2 and 3/4 inch pouching system.   Pouch is [employer], Skin barrier is Kellie Simmering # 2 and Skin barrier ring is Kellie Simmering [phone].  Education provided: Education provided to husband today. Explained role of ostomy nurse and creation of stoma  Explained stoma characteristics (budded, flush, color, texture, care) Demonstrated pouch change (cutting new skin barrier, measuring stoma, cleaning peristomal skin and stoma, use of barrier ring) Education on emptying when 1/3 to 1/2 full and how to empty Demonstrated use of wick to clean spout   Provided with educational folder with educational booklet and teaching tip sheets.\ Husband is able to perform Lock and Roll closure today x2. Answered patient/family questions.    I will see patient again later in the week, either Wednesday or Thursday. Supplies ordered to room. I will communicate with spouse to ensure he is present for session.  Enrolled patient in [place] program: Yes, paperwork completed., Will register in am.  [place] nursing team will follow, and will remain available to this patient,  the nursing and medical teams.    Thanks, Maudie Flakes, MSN, RN, [place], Arther Abbott  Pager# [phone]

## 2020-12-23 NOTE — Consult Note (Signed)
[place] Nurse ostomy consult note  Consult received for patient with new ostomy created emergently on 12/21/20.  Will see in afternoon on Monday, 10/22/21.   Thank you, Maudie Flakes, MSN, RN, [place], Arther Abbott  Pager# [phone]

## 2020-12-24 ENCOUNTER — Encounter (HOSPITAL_COMMUNITY): Payer: Self-pay | Admitting: General Surgery

## 2020-12-24 DIAGNOSIS — A419 Sepsis, unspecified organism: Secondary | ICD-10-CM | POA: Diagnosis not present

## 2020-12-24 LAB — CBC WITH DIFFERENTIAL/PLATELET
Abs Immature Granulocytes: 0.26 10*3/uL — ABNORMAL HIGH (ref 0.00–0.07)
Basophils Absolute: 0.1 10*3/uL (ref 0.0–0.1)
Basophils Relative: 0 %
Eosinophils Absolute: 1 10*3/uL — ABNORMAL HIGH (ref 0.0–0.5)
Eosinophils Relative: 7 %
HCT: 27.9 % — ABNORMAL LOW (ref 36.0–46.0)
Hemoglobin: 9.1 g/dL — ABNORMAL LOW (ref 12.0–15.0)
Immature Granulocytes: 2 %
Lymphocytes Relative: 13 %
Lymphs Abs: 1.7 10*3/uL (ref 0.7–4.0)
MCH: 29.2 pg (ref 26.0–34.0)
MCHC: 32.6 g/dL (ref 30.0–36.0)
MCV: 89.4 fL (ref 80.0–100.0)
Monocytes Absolute: 0.8 10*3/uL (ref 0.1–1.0)
Monocytes Relative: 6 %
Neutro Abs: 9.7 10*3/uL — ABNORMAL HIGH (ref 1.7–7.7)
Neutrophils Relative %: 72 %
Platelets: 153 10*3/uL (ref 150–400)
RBC: 3.12 MIL/uL — ABNORMAL LOW (ref 3.87–5.11)
RDW: 13.4 % (ref 11.5–15.5)
WBC: 13.5 10*3/uL — ABNORMAL HIGH (ref 4.0–10.5)
nRBC: 0 % (ref 0.0–0.2)

## 2020-12-24 LAB — BASIC METABOLIC PANEL
Anion gap: 8 (ref 5–15)
BUN: 11 mg/dL (ref 8–23)
CO2: 29 mmol/L (ref 22–32)
Calcium: 8 mg/dL — ABNORMAL LOW (ref 8.9–10.3)
Chloride: 99 mmol/L (ref 98–111)
Creatinine, Ser: 0.45 mg/dL (ref 0.44–1.00)
GFR, Estimated: >60 mL/min (ref >60)
Glucose, Bld: 110 mg/dL — ABNORMAL HIGH (ref 70–99)
Potassium: 3.5 mmol/L (ref 3.5–5.1)
Sodium: 136 mmol/L (ref 135–145)

## 2020-12-24 LAB — GLUCOSE, CAPILLARY: Glucose-Capillary: 93 mg/dL (ref 70–99)

## 2020-12-24 MED ORDER — LABETALOL HCL 5 MG/ML IV SOLN
10.0000 mg | INTRAVENOUS | Status: DC | PRN
  Administered 2020-12-25: 10 mg via INTRAVENOUS
  Filled 2020-12-24: qty 4

## 2020-12-24 MED ORDER — BUSPIRONE HCL 5 MG PO TABS
15.0000 mg | ORAL_TABLET | Freq: Three times a day (TID) | ORAL | Status: DC
  Administered 2020-12-24 – 2020-12-28 (×12): 15 mg via ORAL
  Filled 2020-12-24 (×12): qty 3

## 2020-12-24 MED ORDER — MELATONIN 3 MG PO TABS
6.0000 mg | ORAL_TABLET | Freq: Every day | ORAL | Status: DC
  Administered 2020-12-24 – 2020-12-27 (×5): 6 mg via ORAL
  Filled 2020-12-24 (×5): qty 2

## 2020-12-24 MED ORDER — MELATONIN 3 MG PO TABS: 6.0000 mg | ORAL_TABLET | Freq: Every day | ORAL | Status: DC

## 2020-12-24 MED ORDER — TRAZODONE HCL 50 MG PO TABS
100.0000 mg | ORAL_TABLET | Freq: Every day | ORAL | Status: DC
  Administered 2020-12-24 – 2020-12-27 (×4): 100 mg via ORAL
  Filled 2020-12-24 (×4): qty 2

## 2020-12-24 MED ORDER — AMITRIPTYLINE HCL 25 MG PO TABS
25.0000 mg | ORAL_TABLET | Freq: Every day | ORAL | Status: DC
  Administered 2020-12-24 – 2020-12-27 (×4): 25 mg via ORAL
  Filled 2020-12-24 (×4): qty 1

## 2020-12-24 MED ORDER — CITALOPRAM HYDROBROMIDE 20 MG PO TABS
40.0000 mg | ORAL_TABLET | Freq: Every day | ORAL | Status: DC
  Administered 2020-12-24 – 2020-12-28 (×5): 40 mg via ORAL
  Filled 2020-12-24 (×5): qty 2

## 2020-12-24 MED ORDER — ENOXAPARIN SODIUM 40 MG/0.4ML ~~LOC~~ SOLN
40.0000 mg | Freq: Every day | SUBCUTANEOUS | Status: DC
  Administered 2020-12-24 – 2020-12-27 (×4): 40 mg via SUBCUTANEOUS
  Filled 2020-12-24 (×5): qty 0.4

## 2020-12-24 MED ORDER — ATENOLOL 25 MG PO TABS
50.0000 mg | ORAL_TABLET | Freq: Every day | ORAL | Status: DC
  Administered 2020-12-24 – 2020-12-28 (×5): 50 mg via ORAL
  Filled 2020-12-24 (×5): qty 2

## 2020-12-24 NOTE — Progress Notes (Signed)
Changed dressing on abdomen as ordered

## 2020-12-24 NOTE — Consult Note (Signed)
[facility] Nurse ostomy follow up Stoma type/location: LLQ Colostomy POD 3  Stoma is not yet functioning. Patient is still in ICU.  Minimal progress with PT today. [place] Nurse will make next visit for continued ostomy teaching and routine pouch change on Thursday, 12/26/20.  [place] nursing team will follow along with you, and will remain available to this patient, the nursing, surgical and medical teams.    Thanks, Maudie Flakes, MSN, RN, [place], Arther Abbott  Pager# [phone]

## 2020-12-24 NOTE — Plan of Care (Signed)
  Problem: Acute Rehab PT Goals(only PT should resolve) Goal: Pt Will Go Supine/Side To Sit Outcome: Progressing Flowsheets (Taken 12/24/2020 1227) Pt will go Supine/Side to Sit:  with supervision  with min guard assist Goal: Patient Will Transfer Sit To/From Stand Outcome: Progressing Flowsheets (Taken 12/24/2020 1227) Patient will transfer sit to/from stand: with min guard assist Goal: Pt Will Transfer Bed To Chair/Chair To Bed Outcome: Progressing Flowsheets (Taken 12/24/2020 1227) Pt will Transfer Bed to Chair/Chair to Bed: min guard assist Goal: Pt Will Ambulate Outcome: Progressing Flowsheets (Taken 12/24/2020 1227) Pt will Ambulate:  50 feet  with min guard assist  with rolling walker   12:28 PM, 12/24/20 Lonell Grandchild, MPT Physical Therapist with [facility] [phone] office [phone] mobile phone

## 2020-12-24 NOTE — Progress Notes (Signed)
Patient Demographics:    [name], is a 60 y.o. female, DOB - May 23, 1961, SFK:[id number]  Admit date - 12/21/2020   Admitting Physician Virl Cagey, MD  Outpatient Primary MD for the patient is [name], DO  LOS - 3   No chief complaint on file.       Subjective:    [name] today has no fevers, no emesis,  No chest pain,   -Husband at bedside, -Tolerating sips  -No significant output in ostomy bag  Assessment  & Plan :    Principal Problem:   Sepsis -due to fecal peritonitis Active Problems:   Colon perforation due to stercoral ulcer with fecal peritonitis   Stercoral ulcer of large intestine   Aspiration pneumonia of both lower lobes due to gastric secretions [facility])   Depression   Essential hypertension   Current smoker   Under care of pain management specialist   Brief Summary:- 60 y.o. female with past medical history relevant for HTN, depression/anxiety and chronic pain syndrome, HLD and obesity admitted on 12/21/2020 with with stercoral ulcer perforation --- underwent colectomy with s/p Hartman's procedure with end colostomy on 12/21/20--with aspiration episode intraoperatively, remains hypotensive requiring pressors - Dr. Constance Haw general surgeon requested hospitalist medicine input for management of ongoing medical issues including concerns for aspiration, respiratory failure, hemodynamic instability requiring Levophed for pressure support -On admission patient did meet sepsis criteria  A/p 1) sepsis secondary to fecal peritonitis in the setting of stercoral ulcer-- -hemodynamically improved, off Levophed -Respiratory status improved, extubated on 12/23/2020 -Continue IV Zosyn thru 12/26/20 and IV fluids -Repeat chest x-ray on 12/23/2020 without acute findings specifically no evidence for definite aspiration pneumonia -patient met sepsis criteria on admission  with tachycardia, tachypnea, leukocytosis and bowel perforation with fecal peritonitis -Currently only on sips orally, awaiting return of bowel function and improved function of the ostomy   2)Stercoral Ulcer perforation with fecal peritonitis and sepsis ---s/p Hartman's procedure with end colostomy-on 12/21/2020 -Postop care per general surgeon -Please see #1 above and #8 below  3)Hypotension/hemodynamic instability--- suspect due to #1 and #2 above, improved, off Levophed, -Hemodynamically more stable  4)Depression/Anxiety/chronic Pain syndrome- --Celexa, BuSpar, trazodone and Elavil -Continue opiates to avoid opiate withdrawal--- PTA patient was on chronic opiates  5)HTN---  start atenolol 50 g daily -Hold lisinopril HCTZ, hold hydralazine   may use IV labetalol when necessary  Every 4 hours for systolic blood pressure over 170 mmhg  6) social/ethics--- patient is a full code  7)HLD-- restart Crestor and oral intake resume  8)Hypokalemia/hypomagnesemia--- potassium was down to 3.1, Mg was 1.4----replaced IV, will recheck -c/n potassium replacement  9) elevated LFTs--- in the setting of shock liver, monitor closely  10)FEN--- check Accu-Cheks/fingersticks to avoid hypoglycemia while n.p.o. until bowel function returns, and ostomy is functioning and patient is tolerating oral intake -LR as ordered  --- Prophylaxis--IV Protonix for GI prophylaxis and Lovenox for DVT prophylaxis  Disposition/Need for in-Hospital Stay- patient unable to be discharged at this time due to -sepsis requiring IV antibiotics and IV fluids*  Status is: Inpatient  Remains inpatient appropriate because:Above   Disposition: The patient is from: home              Anticipated d/c is  to: Home              Anticipated d/c date is: > 3 days              Patient currently is not medically stable to d/c. Barriers: Not Clinically Stable-   Code Status :  -  Code Status: Full Code   Family  Communication:    NA (patient is alert, awake and coherent)   Consults  : General surgery as primary team -Procedures/Lines -Arterial line -Right IJ central line -Foley catheter Intubated 12/21/2020, extubated 12/23/2020  DVT Prophylaxis  :   - SCDs  enoxaparin (LOVENOX) injection 40 mg Start: 12/24/20 1000 SCDs Start: 12/21/20 2340    Lab Results  Component Value Date   PLT 153 12/24/2020    Inpatient Medications  Scheduled Meds: . Chlorhexidine Gluconate Cloth  6 each Topical Daily  . enoxaparin (LOVENOX) injection  40 mg Subcutaneous Daily  . melatonin  6 mg Oral QHS  . metoprolol tartrate  5 mg Intravenous Q6H  . pantoprazole (PROTONIX) IV  40 mg Intravenous Q24H  . potassium chloride  40 mEq Per Tube BID   Continuous Infusions: . sodium chloride    . lactated ringers 75 mL/hr at 12/24/20 6415  . piperacillin-tazobactam (ZOSYN)  IV 3.375 g (12/24/20 1140)   PRN Meds:.diphenhydrAMINE **OR** diphenhydrAMINE, HYDROmorphone (DILAUDID) injection, metoprolol tartrate, ondansetron **OR** ondansetron (ZOFRAN) IV, oxyCODONE    Anti-infectives (From admission, onward)   Start     Dose/Rate Route Frequency Ordered Stop   12/22/20 0600  cefoTEtan (CEFOTAN) 2 g in sodium chloride 0.9 % 100 mL IVPB        2 g 200 mL/hr over 30 Minutes Intravenous On call to O.R. 12/21/20 1932 12/22/20 0630   12/22/20 0200  piperacillin-tazobactam (ZOSYN) IVPB 3.375 g        3.375 g 12.5 mL/hr over 240 Minutes Intravenous Every 8 hours 12/21/20 2339 12/27/20 0159   12/21/20 1830  piperacillin-tazobactam (ZOSYN) IVPB 3.375 g        3.375 g 100 mL/hr over 30 Minutes Intravenous  Once 12/21/20 1829 12/21/20 1933        Objective:   Vitals:   12/24/20 0730 12/24/20 0800 12/24/20 0803 12/24/20 1219  BP: (!) 173/92 (!) 175/91    Pulse: 93 92    Resp: 17 20    Temp:   97.7 F (36.5 C) 97.7 F (36.5 C)  TempSrc:   Oral Oral  SpO2: 99% 98%    Weight:      Height:        Wt Readings from  Last 3 Encounters:  12/24/20 89.5 kg  09/06/20 79.2 kg  08/16/20 78.9 kg     Intake/Output Summary (Last 24 hours) at 12/24/2020 1540 Last data filed at 12/24/2020 1400 Gross per 24 hour  Intake 2356.44 ml  Output 1325 ml  Net 1031.44 ml   Physical Exam  Physical Examination:  General appearance -awake and alert,  nose- [place] 3L/min Neck-right IJ central line Chest -diminished in bases, no wheezing Heart - S1 and S2 normal, regular  Abdomen -postop wound dressing intact, left lower quadrant with colostomy with scant sanguious content, diminished bowel sounds, appropriate postop tenderness Neuro/Psych--affect is flat, no gross new focal deficit  extremities - no pedal edema noted, intact peripheral pulses * Skin - warm, dry GU-Foley catheter in situ MSK-arterial line    Data Review:   Micro Results Recent Results (from the past 240 hour(s))  Resp Panel  by RT-PCR (Flu A&B, Covid) Nasopharyngeal Swab     Status: None   Collection Time: 12/21/20  6:13 PM   Specimen: Nasopharyngeal Swab; Nasopharyngeal(NP) swabs in vial transport medium  Result Value Ref Range Status   SARS Coronavirus 2 by RT PCR NEGATIVE NEGATIVE Final    Comment: (NOTE) SARS-CoV-2 target nucleic acids are NOT DETECTED.  The SARS-CoV-2 RNA is generally detectable in upper respiratory specimens during the acute phase of infection. The lowest concentration of SARS-CoV-2 viral copies this assay can detect is 138 copies/mL. A negative result does not preclude SARS-Cov-2 infection and should not be used as the sole basis for treatment or other patient management decisions. A negative result may occur with  improper specimen collection/handling, submission of specimen other than nasopharyngeal swab, presence of viral mutation(s) within the areas targeted by this assay, and inadequate number of viral copies(<138 copies/mL). A negative result must be combined with clinical observations, patient history, and  epidemiological information. The expected result is Negative.  Fact Sheet for Patients:  EntrepreneurPulse.com.au  Fact Sheet for Healthcare Providers:  IncredibleEmployment.be  This test is no t yet approved or cleared by the [place] FDA and  has been authorized for detection and/or diagnosis of SARS-CoV-2 by FDA under an Emergency Use Authorization (EUA). This EUA will remain  in effect (meaning this test can be used) for the duration of the COVID-19 declaration under Section 564(b)(1) of the Act, 21 U.S.C.section 360bbb-3(b)(1), unless the authorization is terminated  or revoked sooner.       Influenza A by PCR NEGATIVE NEGATIVE Final   Influenza B by PCR NEGATIVE NEGATIVE Final    Comment: (NOTE) The Xpert Xpress SARS-CoV-2/FLU/RSV plus assay is intended as an aid in the diagnosis of influenza from Nasopharyngeal swab specimens and should not be used as a sole basis for treatment. Nasal washings and aspirates are unacceptable for Xpert Xpress SARS-CoV-2/FLU/RSV testing.  Fact Sheet for Patients: EntrepreneurPulse.com.au  Fact Sheet for Healthcare Providers: IncredibleEmployment.be  This test is not yet approved or cleared by the [place] FDA and has been authorized for detection and/or diagnosis of SARS-CoV-2 by FDA under an Emergency Use Authorization (EUA). This EUA will remain in effect (meaning this test can be used) for the duration of the COVID-19 declaration under Section 564(b)(1) of the Act, 21 U.S.C. section 360bbb-3(b)(1), unless the authorization is terminated or revoked.  Performed at [facility], [address] [place], [place] [address]   Aerobic/Anaerobic Culture (surgical/deep wound)     Status: None (Preliminary result)   Collection Time: 12/21/20 11:37 PM   Specimen: PATH Other; Body Fluid  Result Value Ref Range Status   Specimen Description   Final     FLUID Performed at [facility], [address], [place], [place] [address]    Special Requests   Final    ABDOMINAL Performed at [facility], [address] [place], [place] [address]    Gram Stain   Final    FEW WBC PRESENT,BOTH PMN AND MONONUCLEAR FEW GRAM NEGATIVE RODS RARE GRAM POSITIVE COCCI IN PAIRS Performed at [facility], [place] [address] [place], [place] [address]    Culture   Final    RARE ESCHERICHIA COLI CULTURE REINCUBATED FOR BETTER GROWTH MIXED ANAEROBIC FLORA PRESENT.  CALL LAB IF FURTHER IID REQUIRED.    Report Status PENDING  Incomplete   Organism ID, Bacteria ESCHERICHIA COLI  Final      Susceptibility   Escherichia coli - MIC*  AMPICILLIN 8 SENSITIVE Sensitive     CEFAZOLIN <=4 SENSITIVE Sensitive     CEFEPIME <=0.12 SENSITIVE Sensitive     CEFTAZIDIME <=1 SENSITIVE Sensitive     CEFTRIAXONE <=0.25 SENSITIVE Sensitive     CIPROFLOXACIN <=0.25 SENSITIVE Sensitive     GENTAMICIN <=1 SENSITIVE Sensitive     IMIPENEM <=0.25 SENSITIVE Sensitive     TRIMETH/SULFA <=20 SENSITIVE Sensitive     AMPICILLIN/SULBACTAM 4 SENSITIVE Sensitive     PIP/TAZO <=4 SENSITIVE Sensitive     * RARE ESCHERICHIA COLI    Radiology Reports CT ABDOMEN PELVIS W CONTRAST  Result Date: 12/21/2020 CLINICAL DATA:  Abdominal distension, bowel obstruction suspected EXAM: CT ABDOMEN AND PELVIS WITH CONTRAST TECHNIQUE: Multidetector CT imaging of the abdomen and pelvis was performed using the standard protocol following bolus administration of intravenous contrast. CONTRAST:  15m OMNIPAQUE IOHEXOL 300 MG/ML  SOLN COMPARISON:  11/15/2015 FINDINGS: Lower chest: No acute abnormality. Hepatobiliary: No focal liver abnormality is seen. Status post cholecystectomy. Mild postoperative biliary dilatation. Pancreas: Unremarkable. No pancreatic ductal dilatation or surrounding inflammatory changes. Spleen: Normal in size without significant abnormality. Adrenals/Urinary Tract: Adrenal glands  are unremarkable. Kidneys are normal, without renal calculi, solid lesion, or hydronephrosis. Bladder is unremarkable. Stomach/Bowel: The stomach and lower esophagus are fluid-filled. Appendix appears normal in a retrocecal lie. There is a moderate burden of stool throughout the colon, with a large stool ball in the distal sigmoid colon and adjacent extraluminal air (series 2, image 68). Vascular/Lymphatic: Aortic atherosclerosis. No enlarged abdominal or pelvic lymph nodes. Reproductive: Status post hysterectomy. Other: No abdominal wall hernia or abnormality. Small volume pneumoperitoneum and retroperitoneal emphysema. Trace fluid in the right lower quadrant. Musculoskeletal: No acute or significant osseous findings. IMPRESSION: 1. There is a moderate burden of stool throughout the colon, with a large stool ball in the distal sigmoid colon and adjacent extraluminal air throughout the mesentery and retroperitoneum. Findings are consistent with bowel perforation, likely secondary to stercoral ulceration. An obstipating mass of the distal sigmoid would be difficult to exclude and could be further investigated by colonoscopy. 2. Small volume pneumoperitoneum and fluid in the right lower quadrant. These results were called by telephone at the time of interpretation on 12/21/2020 at 6:14 pm [facility], who verbally acknowledged these results. Aortic Atherosclerosis (ICD10-I70.0). Electronically Signed   By: AEddie CandleM.D.   On: 12/21/2020 18:14   DG CHEST PORT 1 VIEW  Result Date: 12/23/2020 CLINICAL DATA:  Dyspnea. EXAM: PORTABLE CHEST 1 VIEW COMPARISON:  December 22, 2020. FINDINGS: Endotracheal tube tip approximately 13 mm above the carina. Right IJ central venous catheter tip projecting at the right atrium. Gastric tube courses below the diaphragm outside the field of view. Free air seen on prior radiograph and on the prior CT is less conspicuous on this exam. Similar streaky bibasilar opacities with  low lung volumes. No new confluent consolidation. No visible pleural effusions or pneumothorax. Similar cardiomediastinal silhouette. Aortic atherosclerosis. Partially imaged ACDF. IMPRESSION: 1. Endotracheal tube tip approximately 13 mm above the carina. Retraction by approximately 2.5 cm would place the tip at the level of the clavicular heads. 2. Similar streaky bibasilar opacities, likely atelectasis. 3. Free air seen on prior radiograph and on the prior CT is less conspicuous on this exam. Electronically Signed   By: FMargaretha SheffieldMD   On: 12/23/2020 07:50   DG Chest Port 1 View  Result Date: 12/22/2020 CLINICAL DATA:  Endotracheal tube placement. EXAM: PORTABLE CHEST 1 VIEW COMPARISON:  Chest radiograph 06/06/2020, included lung bases from abdominal CT earlier today. FINDINGS: Endotracheal tube tip 13 mm from the carina. Tip and side port of the enteric tube below the diaphragm in the stomach. Right central line tip in the right atrium. Heart is normal in size. Curvilinear lucency abutting the right heart border corresponds to free air under the hemidiaphragm. Bibasilar atelectasis, increased. No pneumothorax. No significant pleural effusion. IMPRESSION: 1. Endotracheal tube tip 13 mm from the carina. Enteric tube tip and side port below the diaphragm in the stomach. 2. Right central line tip in the right atrium. 3. Increased bibasilar atelectasis. 4. Known free air in the upper abdomen characterized on CT. Electronically Signed   By: Keith Rake M.D.   On: 12/22/2020 00:11     CBC Recent Labs  Lab 12/21/20 1650 12/22/20 0339 12/23/20 0411 12/24/20 0437  WBC 13.8* 10.2 12.3* 13.5*  HGB 14.0 12.7 9.6* 9.1*  HCT 43.3 38.6 29.4* 27.9*  PLT 266 214 147* 153  MCV 88.0 88.7 89.1 89.4  MCH 28.5 29.2 29.1 29.2  MCHC 32.3 32.9 32.7 32.6  RDW 13.2 13.2 13.6 13.4  LYMPHSABS 0.9 1.0  --  1.7  MONOABS 0.7 0.5  --  0.8  EOSABS 0.1 0.0  --  1.0*  BASOSABS 0.1 0.0  --  0.1    Chemistries   Recent Labs  Lab 12/21/20 1650 12/22/20 0339 12/23/20 0411 12/24/20 0437  NA 136 137 135 136  K 3.6 3.1* 2.8* 3.5  CL 98 96* 96* 99  CO2 '29 29 31 29  ' GLUCOSE 133* 148* 136* 110*  BUN '20 16 12 11  ' CREATININE 0.63 0.71 0.67 0.45  CALCIUM 9.0 8.1* 7.8* 8.0*  MG  --  1.4*  --   --   AST 93* 63* 32  --   ALT 86* 77* 45*  --   ALKPHOS 115 79 72  --   BILITOT 0.8 1.2 1.3*  --    ------------------------------------------------------------------------------------------------------------------ Recent Labs    12/22/20 0341  TRIG 104    Lab Results  Component Value Date   HGBA1C 5.6 05/30/2020   ------------------------------------------------------------------------------------------------------------------ No results for input(s): TSH, T4TOTAL, T3FREE, THYROIDAB in the last 72 hours.  Invalid input(s): FREET3 ------------------------------------------------------------------------------------------------------------------ No results for input(s): VITAMINB12, FOLATE, FERRITIN, TIBC, IRON, RETICCTPCT in the last 72 hours.  Coagulation profile No results for input(s): INR, PROTIME in the last 168 hours.  No results for input(s): DDIMER in the last 72 hours.  Cardiac Enzymes No results for input(s): CKMB, TROPONINI, MYOGLOBIN in the last 168 hours.  Invalid input(s): CK ------------------------------------------------------------------------------------------------------------------ No results found for: BNP   Roxan Hockey M.D on 12/24/2020 at 3:40 PM  Go to [email or web address] - for contact info  [facility] - Office  [phone]

## 2020-12-24 NOTE — Progress Notes (Signed)
[facility] Progress Note  3 Days Post-Op  Subjective: Doing fair but confused and emotional at times. Ostomy without gas. UOP good.   Objective: Vital signs in last 24 hours: Temp:  [97.7 F (36.5 C)-99.1 F (37.3 C)] 97.7 F (36.5 C) (02/15 0803) Pulse Rate:  [92-108] 107 (02/15 0545) Resp:  [11-30] 19 (02/15 0545) BP: (114-198)/(47-97) 169/85 (02/15 0530) SpO2:  [82 %-100 %] 97 % (02/15 0545) Weight:  [89.5 kg] 89.5 kg (02/15 0600) Last BM Date:  (unsure patient is intubated and unable to answer at this time)  Intake/Output from previous day: 02/14 0701 - 02/15 0700 In: 2389.2 [I.V.:1836.1; IV Piggyback:553] Out: 1900 [HALPF:7902] Intake/Output this shift: No intake/output data recorded.  General appearance: alert, cooperative and mild distress Resp: normal work breathing GI: soft, appropriately tender, dressing changes in midline, ostomy pink without gas in bag  Lab Results:  Recent Labs    12/23/20 0411 12/24/20 0437  WBC 12.3* 13.5*  HGB 9.6* 9.1*  HCT 29.4* 27.9*  PLT 147* 153   BMET Recent Labs    12/23/20 0411 12/24/20 0437  NA 135 136  K 2.8* 3.5  CL 96* 99  CO2 31 29  GLUCOSE 136* 110*  BUN 12 11  CREATININE 0.67 0.45  CALCIUM 7.8* 8.0*   PT/INR No results for input(s): LABPROT, INR in the last 72 hours.  Studies/Results: DG CHEST PORT 1 VIEW  Result Date: 12/23/2020 CLINICAL DATA:  Dyspnea. EXAM: PORTABLE CHEST 1 VIEW COMPARISON:  December 22, 2020. FINDINGS: Endotracheal tube tip approximately 13 mm above the carina. Right IJ central venous catheter tip projecting at the right atrium. Gastric tube courses below the diaphragm outside the field of view. Free air seen on prior radiograph and on the prior CT is less conspicuous on this exam. Similar streaky bibasilar opacities with low lung volumes. No new confluent consolidation. No visible pleural effusions or pneumothorax. Similar cardiomediastinal silhouette. Aortic  atherosclerosis. Partially imaged ACDF. IMPRESSION: 1. Endotracheal tube tip approximately 13 mm above the carina. Retraction by approximately 2.5 cm would place the tip at the level of the clavicular heads. 2. Similar streaky bibasilar opacities, likely atelectasis. 3. Free air seen on prior radiograph and on the prior CT is less conspicuous on this exam. Electronically Signed   By: Margaretha Sheffield MD   On: 12/23/2020 07:50    Anti-infectives: Anti-infectives (From admission, onward)   Start     Dose/Rate Route Frequency Ordered Stop   12/22/20 0600  cefoTEtan (CEFOTAN) 2 g in sodium chloride 0.9 % 100 mL IVPB        2 g 200 mL/hr over 30 Minutes Intravenous On call to O.R. 12/21/20 1932 12/22/20 0630   12/22/20 0200  piperacillin-tazobactam (ZOSYN) IVPB 3.375 g        3.375 g 12.5 mL/hr over 240 Minutes Intravenous Every 8 hours 12/21/20 2339 12/27/20 0159   12/21/20 1830  piperacillin-tazobactam (ZOSYN) IVPB 3.375 g        3.375 g 100 mL/hr over 30 Minutes Intravenous  Once 12/21/20 1829 12/21/20 1933      Assessment/Plan: [name] is a 60 yo with stercoral ulcer perforation s/p Hartman's procedure with end colostomy.  PRN for pain IS, OOB PT working with today hopefully Ostomy pink but no function, Education with RN started yesterday Foley out today Zosyn for 5 days post op ending 2/17 Changed her to lovenox from heparin   LOS: 3 days    Virl Cagey 12/24/2020

## 2020-12-24 NOTE — Evaluation (Signed)
Physical Therapy Evaluation Patient Details Name: [name] MRN: [id number] DOB: [birth year] Today's Date: 12/24/2020   History of Present Illness  [name] is a 60 y.o. female s/p Hartman's procedure with sigmoid resection and end colostomy on 12/21/20 with history of abdominal pain that was acute in onset, sharp in nature, and is now constant. It did not get any better during the day and actually got a lot worse.  She has had some associated nausea and vomiting. She reports having some constipation at times but mostly going everyday and having soft stools. She says she has no blood per rectum. She had a colonoscopy 2 years ago that was normal.    Clinical Impression  Patient apprehensive to get up due to c/o severe abdominal pain, but cooperative after encouragement.  Patient demonstrates slow labored movement for sitting up at bedside with fair return for log rolling to side and sitting up from side lying position, unable to stand without use of AD due to increasing abdominal pain, required use of RW, limited to a few steps to transfer to chair and declined to attempt ambulation out of room due to pain.  Patient tolerated sitting up in chair after therapy with her spouse present in room - RN notified.  Patient will benefit from continued physical therapy in hospital and recommended venue below to increase strength, balance, endurance for safe ADLs and gait.      Follow Up Recommendations Home health PT;Supervision for mobility/OOB;Supervision - Intermittent    Equipment Recommendations  Rolling walker with 5" wheels    Recommendations for Other Services       Precautions / Restrictions Precautions Precautions: Fall Restrictions Weight Bearing Restrictions: No      Mobility  Bed Mobility Overal bed mobility: Needs Assistance Bed Mobility: Supine to Sit     Supine to sit: Min assist;Mod assist     General bed mobility comments: increased time, labored movement     Transfers Overall transfer level: Needs assistance Equipment used: Rolling walker (2 wheeled) Transfers: Sit to/from [employer] Transfers   Stand pivot transfers: Min assist       General transfer comment: unable to stand without AD due to c/o severe abdominal pain at surgical site  Ambulation/Gait Ambulation/Gait assistance: Min assist;Mod assist Gait Distance (Feet): 5 Feet Assistive device: Rolling walker (2 wheeled) Gait Pattern/deviations: Decreased step length - right;Decreased step length - left;Decreased stride length Gait velocity: decreased   General Gait Details: limited to 5-6 slow labored side steps at bedside mostly due to abdominal pain and agitation  Stairs            Wheelchair Mobility    Modified Rankin (Stroke Patients Only)       Balance Overall balance assessment: Needs assistance Sitting-balance support: Feet supported;No upper extremity supported Sitting balance-Leahy Scale: Fair Sitting balance - Comments: fair/good seated at EOB   Standing balance support: During functional activity;No upper extremity supported Standing balance-Leahy Scale: Poor Standing balance comment: fair using RW                             Pertinent Vitals/Pain Pain Assessment: Faces Faces Pain Scale: Hurts even more Pain Location: abdomen at surgery site with movement Pain Descriptors / Indicators: Discomfort;Moaning;Guarding;Grimacing;Sore Pain Intervention(s): Limited activity within patient's tolerance;Monitored during session;Repositioned    Home Living Family/patient expects to be discharged to:: Private residence Living Arrangements: Spouse/significant other Available Help at Discharge: Family;Available 24 hours/day Type  of Home: Mobile home Home Access: Ramped entrance     Home Layout: One level Home Equipment: Bedside commode      Prior Function Level of Independence: Independent         Comments: Hydrographic surveyor,  drives     Journalist, newspaper        Extremity/Trunk Assessment   Upper Extremity Assessment Upper Extremity Assessment: Generalized weakness    Lower Extremity Assessment Lower Extremity Assessment: Generalized weakness    Cervical / Trunk Assessment Cervical / Trunk Assessment: Normal  Communication   Communication: No difficulties  Cognition Arousal/Alertness: Awake/alert Behavior During Therapy: WFL for tasks assessed/performed Overall Cognitive Status: Within Functional Limits for tasks assessed                                        General Comments      Exercises     Assessment/Plan    PT Assessment Patient needs continued PT services  PT Problem List Decreased strength;Decreased activity tolerance;Decreased balance;Decreased mobility       PT Treatment Interventions DME instruction;Stair training;Functional mobility training;Therapeutic activities;Gait training;Therapeutic exercise;Patient/family education;Balance training    PT Goals (Current goals can be found in the Care Plan section)  Acute Rehab PT Goals Patient Stated Goal: return home with family to assist PT Goal Formulation: With patient/family Time For Goal Achievement: 12/31/20 Potential to Achieve Goals: Good    Frequency Min 3X/week   Barriers to discharge        Co-evaluation               AM-PAC PT "6 Clicks" Mobility  Outcome Measure Help needed turning from your back to your side while in a flat bed without using bedrails?: A Lot Help needed moving from lying on your back to sitting on the side of a flat bed without using bedrails?: A Lot Help needed moving to and from a bed to a chair (including a wheelchair)?: A Little Help needed standing up from a chair using your arms (e.g., wheelchair or bedside chair)?: A Little Help needed to walk in hospital room?: A Lot Help needed climbing 3-5 steps with a railing? : Total 6 Click Score: 13    End of Session    Activity Tolerance: Patient tolerated treatment well;Patient limited by fatigue Patient left: in chair;with call bell/phone within reach;with family/visitor present Nurse Communication: Mobility status PT Visit Diagnosis: Unsteadiness on feet (R26.81);Other abnormalities of gait and mobility (R26.89);Muscle weakness (generalized) (M62.81)    Time: 6659-9357 PT Time Calculation (min) (ACUTE ONLY): 31 min   Charges:   PT Evaluation $PT Eval Moderate Complexity: 1 Mod PT Treatments $Therapeutic Activity: 23-37 mins        12:24 PM, 12/24/20 Lonell Grandchild, MPT Physical Therapist with [facility] [phone] office [phone] mobile phone

## 2020-12-25 DIAGNOSIS — F331 Major depressive disorder, recurrent, moderate: Secondary | ICD-10-CM

## 2020-12-25 DIAGNOSIS — I10 Essential (primary) hypertension: Secondary | ICD-10-CM | POA: Diagnosis not present

## 2020-12-25 DIAGNOSIS — J690 Pneumonitis due to inhalation of food and vomit: Secondary | ICD-10-CM | POA: Diagnosis not present

## 2020-12-25 DIAGNOSIS — A419 Sepsis, unspecified organism: Secondary | ICD-10-CM | POA: Diagnosis not present

## 2020-12-25 LAB — BASIC METABOLIC PANEL
Anion gap: 9 (ref 5–15)
BUN: 14 mg/dL (ref 8–23)
CO2: 30 mmol/L (ref 22–32)
Calcium: 8.4 mg/dL — ABNORMAL LOW (ref 8.9–10.3)
Chloride: 96 mmol/L — ABNORMAL LOW (ref 98–111)
Creatinine, Ser: 0.47 mg/dL (ref 0.44–1.00)
GFR, Estimated: >60 mL/min (ref >60)
Glucose, Bld: 113 mg/dL — ABNORMAL HIGH (ref 70–99)
Potassium: 3.4 mmol/L — ABNORMAL LOW (ref 3.5–5.1)
Sodium: 135 mmol/L (ref 135–145)

## 2020-12-25 LAB — URINALYSIS, ROUTINE W REFLEX MICROSCOPIC
Bilirubin Urine: NEGATIVE
Glucose, UA: 50 mg/dL — AB
Hgb urine dipstick: NEGATIVE
Ketones, ur: 80 mg/dL — AB
Leukocytes,Ua: NEGATIVE
Nitrite: NEGATIVE
Protein, ur: 30 mg/dL — AB
Specific Gravity, Urine: 1.025 (ref 1.005–1.030)
pH: 6 (ref 5.0–8.0)

## 2020-12-25 LAB — CBC
HCT: 29.5 % — ABNORMAL LOW (ref 36.0–46.0)
Hemoglobin: 9.6 g/dL — ABNORMAL LOW (ref 12.0–15.0)
MCH: 28.7 pg (ref 26.0–34.0)
MCHC: 32.5 g/dL (ref 30.0–36.0)
MCV: 88.1 fL (ref 80.0–100.0)
Platelets: 199 10*3/uL (ref 150–400)
RBC: 3.35 MIL/uL — ABNORMAL LOW (ref 3.87–5.11)
RDW: 13.2 % (ref 11.5–15.5)
WBC: 16.7 10*3/uL — ABNORMAL HIGH (ref 4.0–10.5)
nRBC: 0 % (ref 0.0–0.2)

## 2020-12-25 LAB — GLUCOSE, CAPILLARY
Glucose-Capillary: 120 mg/dL — ABNORMAL HIGH (ref 70–99)
Glucose-Capillary: 122 mg/dL — ABNORMAL HIGH (ref 70–99)

## 2020-12-25 LAB — SURGICAL PATHOLOGY

## 2020-12-25 MED ORDER — POTASSIUM CHLORIDE CRYS ER 20 MEQ PO TBCR
40.0000 meq | EXTENDED_RELEASE_TABLET | Freq: Two times a day (BID) | ORAL | Status: DC
  Administered 2020-12-25 – 2020-12-28 (×6): 40 meq via ORAL
  Filled 2020-12-25 (×7): qty 2

## 2020-12-25 MED ORDER — LABETALOL HCL 5 MG/ML IV SOLN
10.0000 mg | Freq: Once | INTRAVENOUS | Status: AC
  Administered 2020-12-25: 10 mg via INTRAVENOUS
  Filled 2020-12-25: qty 4

## 2020-12-25 MED ORDER — HYDRALAZINE HCL 25 MG PO TABS
50.0000 mg | ORAL_TABLET | Freq: Three times a day (TID) | ORAL | Status: DC
  Administered 2020-12-25 – 2020-12-28 (×9): 50 mg via ORAL
  Filled 2020-12-25 (×9): qty 2

## 2020-12-25 MED ORDER — ALUM & MAG HYDROXIDE-SIMETH 200-200-20 MG/5ML PO SUSP
30.0000 mL | Freq: Four times a day (QID) | ORAL | Status: DC | PRN
  Administered 2020-12-25: 30 mL via ORAL
  Filled 2020-12-25: qty 30

## 2020-12-25 MED ORDER — ISOSORBIDE MONONITRATE ER 60 MG PO TB24
30.0000 mg | ORAL_TABLET | Freq: Every day | ORAL | Status: DC
  Administered 2020-12-25 – 2020-12-28 (×4): 30 mg via ORAL
  Filled 2020-12-25 (×4): qty 1

## 2020-12-25 NOTE — Progress Notes (Signed)
[facility] Progress Note  4 Days Post-Op  Subjective: More awake and feeling better. Gas in her bag.   Objective: Vital signs in last 24 hours: Temp:  [97.3 F (36.3 C)-97.7 F (36.5 C)] 97.4 F (36.3 C) (02/16 0757) Pulse Rate:  [82-103] 86 (02/16 0757) Resp:  [14-26] 22 (02/16 0757) BP: (149-186)/(62-111) 181/91 (02/16 0435) SpO2:  [89 %-100 %] 99 % (02/16 0757) Weight:  [89.6 kg] 89.6 kg (02/16 0331) Last BM Date:  (unsure patient is intubated and unable to answer at this time)  Intake/Output from previous day: 02/15 0701 - 02/16 0700 In: 1805.4 [I.V.:1539.6; IV Piggyback:145.8] Out: 400 [Urine:400] Intake/Output this shift: No intake/output data recorded.  General appearance: alert, cooperative and no distress Resp: normal work of breathing GI: soft, distended, midline with dressing, ostomy pink and edematous gas in bag  Lab Results:  Recent Labs    12/24/20 0437 12/25/20 0507  WBC 13.5* 16.7*  HGB 9.1* 9.6*  HCT 27.9* 29.5*  PLT 153 199   BMET Recent Labs    12/24/20 0437 12/25/20 0507  NA 136 135  K 3.5 3.4*  CL 99 96*  CO2 29 30  GLUCOSE 110* 113*  BUN 11 14  CREATININE 0.45 0.47  CALCIUM 8.0* 8.4*    Anti-infectives: Anti-infectives (From admission, onward)   Start     Dose/Rate Route Frequency Ordered Stop   12/22/20 0600  cefoTEtan (CEFOTAN) 2 g in sodium chloride 0.9 % 100 mL IVPB        2 g 200 mL/hr over 30 Minutes Intravenous On call to O.R. 12/21/20 1932 12/22/20 0630   12/22/20 0200  piperacillin-tazobactam (ZOSYN) IVPB 3.375 g        3.375 g 12.5 mL/hr over 240 Minutes Intravenous Every 8 hours 12/21/20 2339 12/27/20 0159   12/21/20 1830  piperacillin-tazobactam (ZOSYN) IVPB 3.375 g        3.375 g 100 mL/hr over 30 Minutes Intravenous  Once 12/21/20 1829 12/21/20 1933      Assessment/Plan: Ms. Goslin is a 58 yowith stercoral ulcer perforation s/p Hartman's procedure with end colostomy.  PRN for pain IS,  OOB, PT worked with yesterday  Ostomy pink and gas in bag, Clear liquids  Zosyn for 5 days post op ending 2/17, will need to monitor leukocytosis, may need CT scan to look for abscess  SCDs, Lovenox   LOS: 4 days    Virl Cagey 12/25/2020

## 2020-12-25 NOTE — TOC Initial Note (Signed)
Transition of Care [facility]) - Initial/Assessment Note    Patient Details  Name: [name] MRN: [id number] Date of Birth: 02-14-1961  Transition of [facility]) CM/SW Contact:    Ihor Gully, LCSW Phone Number: 12/25/2020, 2:17 PM  Clinical Narrative:                 Patient from home with spouse and sister. At baseline, patient is independent with ambulation and independent with ADLs. PT recommended HHPT and RW recommended. Referrals made to [facility] and [facility] as they are accepting of patient's insurance currently.  Expected Discharge Plan: [place] Barriers to Discharge: Continued Medical Work up   Patient Goals and CMS Choice Patient states their goals for this hospitalization and ongoing recovery are:: home with [facility]      Expected Discharge Plan and Services Expected Discharge Plan: [place]       Living arrangements for the past 2 months: Single Family Home                 DME Arranged: Walker rolling DME Agency: [facility] Date DME Agency Contacted: 12/25/20 Time DME Agency Contacted: [phone] Representative spoke with at DME Agency: [place]: RN,PT [place] Agency: [place] ([place]) Date [place]: 12/25/20 Time [place]: [place] Representative spoke with at [place]: Vaughan Basta  Prior Living Arrangements/Services Living arrangements for the past 2 months: [place] with:: Spouse,Siblings Patient language and need for interpreter reviewed:: Yes Do you feel safe going back to the place where you live?: Yes      Need for Family Participation in Patient Care: Yes (Comment) Care giver support system in place?: Yes (comment)   Criminal Activity/Legal Involvement Pertinent to Current Situation/Hospitalization: No - Comment as needed  Activities of Daily Living Home Assistive Devices/Equipment: None ADL Screening (condition at time of admission) Patient's cognitive ability adequate  to safely complete daily activities?: Yes Is the patient deaf or have difficulty hearing?: No Does the patient have difficulty seeing, even when wearing glasses/contacts?: No Does the patient have difficulty concentrating, remembering, or making decisions?: No Patient able to express need for assistance with ADLs?: Yes Does the patient have difficulty dressing or bathing?: Yes Independently performs ADLs?: Yes (appropriate for developmental age) Does the patient have difficulty walking or climbing stairs?: No Weakness of Legs: None Weakness of Arms/Hands: None  Permission Sought/Granted                  Emotional Assessment Appearance:: Appears stated age   Affect (typically observed): Appropriate Orientation: : Oriented to Self,Oriented to  Time,Oriented to Situation,Oriented to Place Alcohol / Substance Use: Not Applicable Psych Involvement: No (comment)  Admission diagnosis:  Colon perforation ([place]) [K63.1] Stercoral ulcer of large intestine [K63.3] Patient Active Problem List   Diagnosis Date Noted  . Sepsis -due to fecal peritonitis 12/23/2020  . Aspiration pneumonia of both lower lobes due to gastric secretions ([place]) 12/22/2020  . Stercoral ulcer of large intestine 12/21/2020  . Colon perforation due to stercoral ulcer with fecal peritonitis   . Under care of pain management specialist 05/30/2020  . Benzodiazepine dependence ([place]) 08/08/2018  . Controlled substance agreement broken 08/08/2018  . Hyperlipemia 08/05/2017  . Chest pain 06/30/2017  . AKI (acute kidney injury) ([place]) 07/09/2016  . Hypokalemia 07/09/2016  . Obesity (BMI 30-39.9) 07/02/2016  . Osteopenia 12/20/2013  . Diverticulosis of colon (without mention of hemorrhage) 02/04/2012  . GERD (gastroesophageal reflux  disease) 02/04/2012  . Dysphagia 02/04/2012  . Unspecified gastritis and gastroduodenitis without mention of hemorrhage 02/04/2012  . Cystic liver mass 01/26/2012  . Cholecystitis chronic  01/26/2012  . SYNCOPE 12/20/2007  . GAD (generalized anxiety disorder) 01/25/2007  . Depression 01/25/2007  . Essential hypertension 01/25/2007  . BREAST MASSES, BILATERAL 01/25/2007  . DEGENERATIVE DISC DISEASE, CERVICAL SPINE 01/25/2007  . LOW BACK PAIN 01/25/2007  . SEIZURE DISORDER 01/25/2007  . Insomnia 01/25/2007  . DEPENDENT EDEMA, LEGS, BILATERAL 01/25/2007  . Current smoker 01/25/2007  . TAH/BSO, HX OF 01/25/2007   PCP:  [name], DO Pharmacy:   [id number] [place], [address] [place] [address] Phone: [phone] Fax: [phone]  [id number] - [place], [address] [place] [address] Phone: [phone] Fax: [phone]     Social Determinants of Health (SDOH) Interventions    Readmission Risk Interventions No flowsheet data found.

## 2020-12-25 NOTE — Progress Notes (Addendum)
Patient Demographics:    [name], is a 60 y.o. female, DOB - 1961-06-17, TZG:[id number]  Admit date - 12/21/2020   Admitting Physician Virl Cagey, MD  Outpatient Primary MD for the patient is [name], DO  LOS - 4   No chief complaint on file.       Subjective:    Dolphus Jenny today has no fevers, no emesis,  No chest pain,    patient has gas and scant amount of liquid stool in ostomy bag -Denies dysuria  Assessment  & Plan :    Principal Problem:   Sepsis -due to fecal peritonitis Active Problems:   Colon perforation due to stercoral ulcer with fecal peritonitis   Stercoral ulcer of large intestine   Aspiration pneumonia of both lower lobes due to gastric secretions [facility])   Depression   Essential hypertension   Current smoker   Under care of pain management specialist   Brief Summary:- 60 y.o. female with past medical history relevant for HTN, depression/anxiety and chronic pain syndrome, HLD and obesity admitted on 12/21/2020 with with stercoral ulcer perforation --- underwent colectomy with s/p Hartman's procedure with end colostomy on 12/21/20--with aspiration episode intraoperatively, remains hypotensive requiring pressors - Dr. Constance Haw general surgeon requested hospitalist medicine input for management of ongoing medical issues including concerns for aspiration, respiratory failure, hemodynamic instability requiring Levophed for pressure support -On admission patient did meet sepsis criteria  A/p 1) sepsis secondary to fecal peritonitis in the setting of stercoral ulcer-- -hemodynamically improved, off Levophed -Respiratory status improved, extubated on 12/23/2020 -Continue IV Zosyn thru 12/26/20 and IV fluids -Repeat chest x-ray on 12/23/2020 without acute findings specifically no evidence for definite aspiration pneumonia -patient met sepsis criteria on  admission with tachycardia, tachypnea, leukocytosis and bowel perforation with fecal peritonitis  12/25/20 -Ostomy bag with gas and scant amount of liquid stool signifying possible return of bowel function -As per general surgeon okay to start clear liquid diet  2)Stercoral Ulcer perforation with fecal peritonitis and sepsis ---s/p Hartman's procedure with end colostomy-on 12/21/2020 -Postop care per general surgeon -Please see #1 above and #8 below  3) leukocytosis--may be reactive, may be due to #1 above, -Check UA -IV Zosyn as above #1 with last dose on 12/26/2020  4)Depression/Anxiety/chronic Pain syndrome- --Celexa, BuSpar, trazodone and Elavil -Continue opiates to avoid opiate withdrawal--- PTA patient was on chronic opiates  5)HTN---  BP is not at goal, continue atenolol 50 g daily, hydralazine/isosorbide -Hold lisinopril HCTZ,   may use IV labetalol when necessary  Every 4 hours for systolic blood pressure over 170 mmhg  6) social/ethics--- patient is a full code  7)HLD-- restart Crestor and oral intake resume  8)Hypokalemia/hypomagnesemia--- potassium was down to 3.1, Mg was 1.4----replaced IV, will recheck -c/n potassium replacement  9) elevated LFTs--- in the setting of shock liver, monitor closely  Hepatic Function Latest Ref Rng & Units 12/23/2020 12/22/2020 12/21/2020  Total Protein 6.5 - 8.1 g/dL 4.9(L) 4.9(L) 6.7  Albumin 3.5 - 5.0 g/dL 2.2(L) 2.6(L) 3.6  AST 15 - 41 U/L 32 63(H) 93(H)  ALT 0 - 44 U/L 45(H) 77(H) 86(H)  Alk Phosphatase 38 - 126 U/L 72 79 115  Total Bilirubin 0.3 - 1.2 mg/dL 1.3(H) 1.2 0.8  Bilirubin, Direct 0.00 - 0.40 mg/dL - - -     10)FEN--- -LR as ordered -Clear liquid diet as ordered by general surgeon on 12/25/2020 -Continue Accu-Cheks  11) acute anemia--hemoglobin is down to just above 9 from a baseline typically above 12 suspect some component of hemodilution from IV fluids as well as acute blood loss anemia due to perioperative  and postoperative blood loss -Continue to monitor and transfuse as clinically indicated  --- Prophylaxis--IV Protonix for GI prophylaxis and Lovenox for DVT prophylaxis  Disposition/Need for in-Hospital Stay- patient unable to be discharged at this time due to -sepsis requiring IV antibiotics and IV fluids*  Status is: Inpatient  Remains inpatient appropriate because:Above   Disposition: The patient is from: home              Anticipated d/c is to: Home              Anticipated d/c date is: > 3 days              Patient currently is not medically stable to d/c. Barriers: Not Clinically Stable-   Code Status :  -  Code Status: Full Code   Family Communication:    NA (patient is alert, awake and coherent)   Consults  : General surgery as primary team -Procedures/Lines -Arterial line -Right IJ central line -Foley catheter Intubated 12/21/2020, extubated 12/23/2020  DVT Prophylaxis  :   - SCDs  enoxaparin (LOVENOX) injection 40 mg Start: 12/24/20 1000 SCDs Start: 12/21/20 2340    Lab Results  Component Value Date   PLT 199 12/25/2020    Inpatient Medications  Scheduled Meds: . amitriptyline  25 mg Oral QHS  . atenolol  50 mg Oral Daily  . busPIRone  15 mg Oral TID  . Chlorhexidine Gluconate Cloth  6 each Topical Daily  . citalopram  40 mg Oral Daily  . enoxaparin (LOVENOX) injection  40 mg Subcutaneous Daily  . hydrALAZINE  50 mg Oral TID  . isosorbide mononitrate  30 mg Oral Daily  . labetalol  10 mg Intravenous Once  . melatonin  6 mg Oral QHS  . pantoprazole (PROTONIX) IV  40 mg Intravenous Q24H  . potassium chloride  40 mEq Oral BID  . traZODone  100 mg Oral QHS   Continuous Infusions: . sodium chloride    . lactated ringers Stopped (12/25/20 0257)  . piperacillin-tazobactam (ZOSYN)  IV 3.375 g (12/25/20 0942)   PRN Meds:.diphenhydrAMINE **OR** diphenhydrAMINE, HYDROmorphone (DILAUDID) injection, labetalol, ondansetron **OR** ondansetron (ZOFRAN) IV,  oxyCODONE    Anti-infectives (From admission, onward)   Start     Dose/Rate Route Frequency Ordered Stop   12/22/20 0600  cefoTEtan (CEFOTAN) 2 g in sodium chloride 0.9 % 100 mL IVPB        2 g 200 mL/hr over 30 Minutes Intravenous On call to O.R. 12/21/20 1932 12/22/20 0630   12/22/20 0200  piperacillin-tazobactam (ZOSYN) IVPB 3.375 g        3.375 g 12.5 mL/hr over 240 Minutes Intravenous Every 8 hours 12/21/20 2339 12/27/20 0159   12/21/20 1830  piperacillin-tazobactam (ZOSYN) IVPB 3.375 g        3.375 g 100 mL/hr over 30 Minutes Intravenous  Once 12/21/20 1829 12/21/20 1933        Objective:   Vitals:   12/25/20 1100 12/25/20 1114 12/25/20 1200 12/25/20 1300  BP: (!) 181/67  (!) 177/86 (!) 179/97  Pulse: 78 80 84 83  Resp: 14 (!) 24 (!)  22 19  Temp:  (!) 97.4 F (36.3 C)    TempSrc:  Oral    SpO2: 97% 95% 98% 97%  Weight:      Height:        Wt Readings from Last 3 Encounters:  12/25/20 89.6 kg  09/06/20 79.2 kg  08/16/20 78.9 kg     Intake/Output Summary (Last 24 hours) at 12/25/2020 1446 Last data filed at 12/25/2020 1200 Gross per 24 hour  Intake 1240.86 ml  Output 900 ml  Net 340.86 ml   Physical Exam  Physical Examination:  General appearance -awake and alert,  nose- [place] 3L/min Neck-right IJ central line Chest -diminished in bases, no wheezing Heart - S1 and S2 normal, regular  Abdomen -postop wound dressing intact, left lower quadrant with colostomy with gas and scant liquid stool, appropriate postop tenderness Neuro/Psych--affect is flat, no gross new focal deficit  extremities - no pedal edema noted, intact peripheral pulses * Skin - warm, dry GU-Foley catheter in situ--removed MSK-arterial line    Data Review:   Micro Results Recent Results (from the past 240 hour(s))  Resp Panel by RT-PCR (Flu A&B, Covid) Nasopharyngeal Swab     Status: None   Collection Time: 12/21/20  6:13 PM   Specimen: Nasopharyngeal Swab; Nasopharyngeal(NP) swabs in  vial transport medium  Result Value Ref Range Status   SARS Coronavirus 2 by RT PCR NEGATIVE NEGATIVE Final    Comment: (NOTE) SARS-CoV-2 target nucleic acids are NOT DETECTED.  The SARS-CoV-2 RNA is generally detectable in upper respiratory specimens during the acute phase of infection. The lowest concentration of SARS-CoV-2 viral copies this assay can detect is 138 copies/mL. A negative result does not preclude SARS-Cov-2 infection and should not be used as the sole basis for treatment or other patient management decisions. A negative result may occur with  improper specimen collection/handling, submission of specimen other than nasopharyngeal swab, presence of viral mutation(s) within the areas targeted by this assay, and inadequate number of viral copies(<138 copies/mL). A negative result must be combined with clinical observations, patient history, and epidemiological information. The expected result is Negative.  Fact Sheet for Patients:  EntrepreneurPulse.com.au  Fact Sheet for Healthcare Providers:  IncredibleEmployment.be  This test is no t yet approved or cleared by the [place] FDA and  has been authorized for detection and/or diagnosis of SARS-CoV-2 by FDA under an Emergency Use Authorization (EUA). This EUA will remain  in effect (meaning this test can be used) for the duration of the COVID-19 declaration under Section 564(b)(1) of the Act, 21 U.S.C.section 360bbb-3(b)(1), unless the authorization is terminated  or revoked sooner.       Influenza A by PCR NEGATIVE NEGATIVE Final   Influenza B by PCR NEGATIVE NEGATIVE Final    Comment: (NOTE) The Xpert Xpress SARS-CoV-2/FLU/RSV plus assay is intended as an aid in the diagnosis of influenza from Nasopharyngeal swab specimens and should not be used as a sole basis for treatment. Nasal washings and aspirates are unacceptable for Xpert Xpress SARS-CoV-2/FLU/RSV testing.  Fact  Sheet for Patients: EntrepreneurPulse.com.au  Fact Sheet for Healthcare Providers: IncredibleEmployment.be  This test is not yet approved or cleared by the [place] FDA and has been authorized for detection and/or diagnosis of SARS-CoV-2 by FDA under an Emergency Use Authorization (EUA). This EUA will remain in effect (meaning this test can be used) for the duration of the COVID-19 declaration under Section 564(b)(1) of the Act, 21 U.S.C. section 360bbb-3(b)(1), unless the authorization is terminated  or revoked.  Performed at [facility], [address], [place], [place] [address]   Aerobic/Anaerobic Culture (surgical/deep wound)     Status: None (Preliminary result)   Collection Time: 12/21/20 11:37 PM   Specimen: PATH Other; Body Fluid  Result Value Ref Range Status   Specimen Description   Final    FLUID Performed at [facility], [address] [place], [place] [address]    Special Requests   Final    ABDOMINAL Performed at [facility], [address] [place], [place] [address]    Gram Stain   Final    FEW WBC PRESENT,BOTH PMN AND MONONUCLEAR FEW GRAM NEGATIVE RODS RARE GRAM POSITIVE COCCI IN PAIRS Performed at [facility], [place] [address], [place], [place] [address]    Culture   Final    RARE ESCHERICHIA COLI CULTURE REINCUBATED FOR BETTER GROWTH MIXED ANAEROBIC FLORA PRESENT.  CALL LAB IF FURTHER IID REQUIRED.    Report Status PENDING  Incomplete   Organism ID, Bacteria ESCHERICHIA COLI  Final      Susceptibility   Escherichia coli - MIC*    AMPICILLIN 8 SENSITIVE Sensitive     CEFAZOLIN <=4 SENSITIVE Sensitive     CEFEPIME <=0.12 SENSITIVE Sensitive     CEFTAZIDIME <=1 SENSITIVE Sensitive     CEFTRIAXONE <=0.25 SENSITIVE Sensitive     CIPROFLOXACIN <=0.25 SENSITIVE Sensitive     GENTAMICIN <=1 SENSITIVE Sensitive     IMIPENEM <=0.25 SENSITIVE Sensitive     TRIMETH/SULFA <=20 SENSITIVE Sensitive      AMPICILLIN/SULBACTAM 4 SENSITIVE Sensitive     PIP/TAZO <=4 SENSITIVE Sensitive     * RARE ESCHERICHIA COLI    Radiology Reports CT ABDOMEN PELVIS W CONTRAST  Result Date: 12/21/2020 CLINICAL DATA:  Abdominal distension, bowel obstruction suspected EXAM: CT ABDOMEN AND PELVIS WITH CONTRAST TECHNIQUE: Multidetector CT imaging of the abdomen and pelvis was performed using the standard protocol following bolus administration of intravenous contrast. CONTRAST:  171m OMNIPAQUE IOHEXOL 300 MG/ML  SOLN COMPARISON:  11/15/2015 FINDINGS: Lower chest: No acute abnormality. Hepatobiliary: No focal liver abnormality is seen. Status post cholecystectomy. Mild postoperative biliary dilatation. Pancreas: Unremarkable. No pancreatic ductal dilatation or surrounding inflammatory changes. Spleen: Normal in size without significant abnormality. Adrenals/Urinary Tract: Adrenal glands are unremarkable. Kidneys are normal, without renal calculi, solid lesion, or hydronephrosis. Bladder is unremarkable. Stomach/Bowel: The stomach and lower esophagus are fluid-filled. Appendix appears normal in a retrocecal lie. There is a moderate burden of stool throughout the colon, with a large stool ball in the distal sigmoid colon and adjacent extraluminal air (series 2, image 68). Vascular/Lymphatic: Aortic atherosclerosis. No enlarged abdominal or pelvic lymph nodes. Reproductive: Status post hysterectomy. Other: No abdominal wall hernia or abnormality. Small volume pneumoperitoneum and retroperitoneal emphysema. Trace fluid in the right lower quadrant. Musculoskeletal: No acute or significant osseous findings. IMPRESSION: 1. There is a moderate burden of stool throughout the colon, with a large stool ball in the distal sigmoid colon and adjacent extraluminal air throughout the mesentery and retroperitoneum. Findings are consistent with bowel perforation, likely secondary to stercoral ulceration. An obstipating mass of the distal sigmoid  would be difficult to exclude and could be further investigated by colonoscopy. 2. Small volume pneumoperitoneum and fluid in the right lower quadrant. These results were called by telephone at the time of interpretation on 12/21/2020 at 6:14 pm to [facility], who verbally acknowledged these results. Aortic Atherosclerosis (ICD10-I70.0). Electronically Signed   By: ADorna BloomD.  On: 12/21/2020 18:14   DG CHEST PORT 1 VIEW  Result Date: 12/23/2020 CLINICAL DATA:  Dyspnea. EXAM: PORTABLE CHEST 1 VIEW COMPARISON:  December 22, 2020. FINDINGS: Endotracheal tube tip approximately 13 mm above the carina. Right IJ central venous catheter tip projecting at the right atrium. Gastric tube courses below the diaphragm outside the field of view. Free air seen on prior radiograph and on the prior CT is less conspicuous on this exam. Similar streaky bibasilar opacities with low lung volumes. No new confluent consolidation. No visible pleural effusions or pneumothorax. Similar cardiomediastinal silhouette. Aortic atherosclerosis. Partially imaged ACDF. IMPRESSION: 1. Endotracheal tube tip approximately 13 mm above the carina. Retraction by approximately 2.5 cm would place the tip at the level of the clavicular heads. 2. Similar streaky bibasilar opacities, likely atelectasis. 3. Free air seen on prior radiograph and on the prior CT is less conspicuous on this exam. Electronically Signed   By: Margaretha Sheffield MD   On: 12/23/2020 07:50   DG Chest Port 1 View  Result Date: 12/22/2020 CLINICAL DATA:  Endotracheal tube placement. EXAM: PORTABLE CHEST 1 VIEW COMPARISON:  Chest radiograph 06/06/2020, included lung bases from abdominal CT earlier today. FINDINGS: Endotracheal tube tip 13 mm from the carina. Tip and side port of the enteric tube below the diaphragm in the stomach. Right central line tip in the right atrium. Heart is normal in size. Curvilinear lucency abutting the right heart border corresponds to free  air under the hemidiaphragm. Bibasilar atelectasis, increased. No pneumothorax. No significant pleural effusion. IMPRESSION: 1. Endotracheal tube tip 13 mm from the carina. Enteric tube tip and side port below the diaphragm in the stomach. 2. Right central line tip in the right atrium. 3. Increased bibasilar atelectasis. 4. Known free air in the upper abdomen characterized on CT. Electronically Signed   By: Keith Rake M.D.   On: 12/22/2020 00:11     CBC Recent Labs  Lab 12/21/20 1650 12/22/20 0339 12/23/20 0411 12/24/20 0437 12/25/20 0507  WBC 13.8* 10.2 12.3* 13.5* 16.7*  HGB 14.0 12.7 9.6* 9.1* 9.6*  HCT 43.3 38.6 29.4* 27.9* 29.5*  PLT 266 214 147* 153 199  MCV 88.0 88.7 89.1 89.4 88.1  MCH 28.5 29.2 29.1 29.2 28.7  MCHC 32.3 32.9 32.7 32.6 32.5  RDW 13.2 13.2 13.6 13.4 13.2  LYMPHSABS 0.9 1.0  --  1.7  --   MONOABS 0.7 0.5  --  0.8  --   EOSABS 0.1 0.0  --  1.0*  --   BASOSABS 0.1 0.0  --  0.1  --     Chemistries  Recent Labs  Lab 12/21/20 1650 12/22/20 0339 12/23/20 0411 12/24/20 0437 12/25/20 0507  NA 136 137 135 136 135  K 3.6 3.1* 2.8* 3.5 3.4*  CL 98 96* 96* 99 96*  CO2 _0 GLUCOSE 133* 148* 136* 110* 113*  BUN _1 CREATININE 0.63 0.71 0.67 0.45 0.47  CALCIUM 9.0 8.1* 7.8* 8.0* 8.4*  MG  --  1.4*  --   --   --   AST 93* 63* 32  --   --   ALT 86* 77* 45*  --   --   ALKPHOS 115 79 72  --   --   BILITOT 0.8 1.2 1.3*  --   --    ------------------------------------------------------------------------------------------------------------------ No results for input(s): CHOL, HDL, LDLCALC, TRIG, CHOLHDL, LDLDIRECT in the last 72 hours.  Lab Results  Component  Value Date   HGBA1C 5.6 05/30/2020   ------------------------------------------------------------------------------------------------------------------ No results for input(s): TSH, T4TOTAL, T3FREE, THYROIDAB in the last 72 hours.  Invalid input(s):  FREET3 ------------------------------------------------------------------------------------------------------------------ No results for input(s): VITAMINB12, FOLATE, FERRITIN, TIBC, IRON, RETICCTPCT in the last 72 hours.  Coagulation profile No results for input(s): INR, PROTIME in the last 168 hours.  No results for input(s): DDIMER in the last 72 hours.  Cardiac Enzymes No results for input(s): CKMB, TROPONINI, MYOGLOBIN in the last 168 hours.  Invalid input(s): CK ------------------------------------------------------------------------------------------------------------------ No results found for: BNP   Roxan Hockey M.D on 12/25/2020 at 2:46 PM  Go to [email or web address] - for contact info  [facility] - Office  [phone]

## 2020-12-25 NOTE — Progress Notes (Signed)
Physical Therapy Treatment Patient Details Name: [name] MRN: [id number] DOB: 15-Mar-1961 Today's Date: 12/25/2020    History of Present Illness [name] is a 60 y.o. female s/p Hartman's procedure with sigmoid resection and end colostomy on 12/21/20 with history of abdominal pain that was acute in onset, sharp in nature, and is now constant. It did not get any better during the day and actually got a lot worse.  She has had some associated nausea and vomiting. She reports having some constipation at times but mostly going everyday and having soft stools. She says she has no blood per rectum. She had a colonoscopy 2 years ago that was normal.    PT Comments    Patient demonstrates improvement for rolling to side in bed, but has difficulty propping up on elbow during side lying to sitting due to c/o severe abdominal pain at surgical site.  Patient limited to a few steps at bedside due to fatigue and abdominal pain with SpO2 dropping from 96% to 75% while on room air and put back on 2 LPM with SpO2 increasing above 95%.  Patient tolerated sitting up in chair with her spouse present in room after therapy.  Patient will benefit from continued physical therapy in hospital and recommended venue below to increase strength, balance, endurance for safe ADLs and gait.    Follow Up Recommendations  Home health PT;Supervision for mobility/OOB;Supervision - Intermittent     Equipment Recommendations  Rolling walker with 5" wheels    Recommendations for Other Services       Precautions / Restrictions Precautions Precautions: Fall Restrictions Weight Bearing Restrictions: No    Mobility  Bed Mobility Overal bed mobility: Needs Assistance Bed Mobility: Rolling;Sidelying to Sit Rolling: Min assist Sidelying to sit: Min assist;Mod assist       General bed mobility comments: increased time, labored movement, poor carryover for propping up on elbow during sidelying to sitting     Transfers Overall transfer level: Needs assistance Equipment used: Rolling walker (2 wheeled) Transfers: Sit to/from [employer] Transfers   Stand pivot transfers: Min assist       General transfer comment: increased time, labored movement  Ambulation/Gait Ambulation/Gait assistance: Min assist Gait Distance (Feet): 6 Feet Assistive device: Rolling walker (2 wheeled) Gait Pattern/deviations: Decreased step length - right;Decreased step length - left;Decreased stride length Gait velocity: decreased   General Gait Details: limited to 6-7 slow labored steps due to c/o severe increase in abdominal pain   Stairs             Wheelchair Mobility    Modified Rankin (Stroke Patients Only)       Balance Overall balance assessment: Needs assistance Sitting-balance support: Feet supported;No upper extremity supported Sitting balance-Leahy Scale: Fair Sitting balance - Comments: fair/good seated at EOB   Standing balance support: During functional activity;No upper extremity supported Standing balance-Leahy Scale: Fair Standing balance comment: using RW                            Cognition Arousal/Alertness: Awake/alert Behavior During Therapy: WFL for tasks assessed/performed Overall Cognitive Status: Within Functional Limits for tasks assessed                                        Exercises General Exercises - Lower Extremity Long Arc Quad: Seated;AROM;Strengthening;Both;10 reps Hip Flexion/Marching: Seated;AROM;Strengthening;Both;10  reps Toe Raises: Seated;AROM;Strengthening;Both;10 reps Heel Raises: Seated;AROM;Strengthening;Both;10 reps    General Comments        Pertinent Vitals/Pain Pain Assessment: Faces Faces Pain Scale: Hurts even more Pain Location: abdomen at surgery site with movement Pain Descriptors / Indicators: Discomfort;Moaning;Guarding;Grimacing;Sore Pain Intervention(s): Limited activity within  patient's tolerance;Monitored during session;Repositioned    Home Living Family/patient expects to be discharged to:: Private residence Living Arrangements: Spouse/significant other                  Prior Function            PT Goals (current goals can now be found in the care plan section) Acute Rehab PT Goals Patient Stated Goal: return home with family to assist PT Goal Formulation: With patient/family Time For Goal Achievement: 12/31/20 Potential to Achieve Goals: Good Progress towards PT goals: Progressing toward goals    Frequency    Min 3X/week      PT Plan      Co-evaluation              AM-PAC PT "6 Clicks" Mobility   Outcome Measure  Help needed turning from your back to your side while in a flat bed without using bedrails?: A Lot Help needed moving from lying on your back to sitting on the side of a flat bed without using bedrails?: A Lot Help needed moving to and from a bed to a chair (including a wheelchair)?: A Little Help needed standing up from a chair using your arms (e.g., wheelchair or bedside chair)?: A Little Help needed to walk in hospital room?: A Lot Help needed climbing 3-5 steps with a railing? : A Lot 6 Click Score: 14    End of Session Equipment Utilized During Treatment: Oxygen Activity Tolerance: Patient tolerated treatment well;Patient limited by fatigue Patient left: in chair;with call bell/phone within reach;with family/visitor present Nurse Communication: Mobility status PT Visit Diagnosis: Unsteadiness on feet (R26.81);Other abnormalities of gait and mobility (R26.89);Muscle weakness (generalized) (M62.81)     Time: 4481-8563 PT Time Calculation (min) (ACUTE ONLY): 23 min  Charges:  $Therapeutic Exercise: 8-22 mins $Therapeutic Activity: 8-22 mins                     11:29 AM, 12/25/20 Lonell Grandchild, MPT Physical Therapist with [facility] 336 [phone] office [phone] mobile phone

## 2020-12-26 ENCOUNTER — Inpatient Hospital Stay (HOSPITAL_COMMUNITY): Payer: Medicare HMO

## 2020-12-26 DIAGNOSIS — K631 Perforation of intestine (nontraumatic): Secondary | ICD-10-CM | POA: Diagnosis not present

## 2020-12-26 DIAGNOSIS — A419 Sepsis, unspecified organism: Secondary | ICD-10-CM | POA: Diagnosis not present

## 2020-12-26 DIAGNOSIS — F331 Major depressive disorder, recurrent, moderate: Secondary | ICD-10-CM | POA: Diagnosis not present

## 2020-12-26 DIAGNOSIS — Z789 Other specified health status: Secondary | ICD-10-CM

## 2020-12-26 DIAGNOSIS — R110 Nausea: Secondary | ICD-10-CM

## 2020-12-26 DIAGNOSIS — J690 Pneumonitis due to inhalation of food and vomit: Secondary | ICD-10-CM | POA: Diagnosis not present

## 2020-12-26 LAB — CBC WITH DIFFERENTIAL/PLATELET
Band Neutrophils: 2 %
Basophils Absolute: 0 10*3/uL (ref 0.0–0.1)
Basophils Relative: 0 %
Eosinophils Absolute: 0.5 10*3/uL (ref 0.0–0.5)
Eosinophils Relative: 3 %
HCT: 28.4 % — ABNORMAL LOW (ref 36.0–46.0)
Hemoglobin: 9.3 g/dL — ABNORMAL LOW (ref 12.0–15.0)
Lymphocytes Relative: 21 %
Lymphs Abs: 3.7 10*3/uL (ref 0.7–4.0)
MCH: 28.6 pg (ref 26.0–34.0)
MCHC: 32.7 g/dL (ref 30.0–36.0)
MCV: 87.4 fL (ref 80.0–100.0)
Metamyelocytes Relative: 1 %
Monocytes Absolute: 1.9 10*3/uL — ABNORMAL HIGH (ref 0.1–1.0)
Monocytes Relative: 11 %
Myelocytes: 2 %
Neutro Abs: 10.8 10*3/uL — ABNORMAL HIGH (ref 1.7–7.7)
Neutrophils Relative %: 60 %
Platelets: 231 10*3/uL (ref 150–400)
RBC: 3.25 MIL/uL — ABNORMAL LOW (ref 3.87–5.11)
RDW: 13.4 % (ref 11.5–15.5)
WBC: 17.4 10*3/uL — ABNORMAL HIGH (ref 4.0–10.5)
nRBC: 0 % (ref 0.0–0.2)

## 2020-12-26 LAB — GLUCOSE, CAPILLARY
Glucose-Capillary: 127 mg/dL — ABNORMAL HIGH (ref 70–99)
Glucose-Capillary: 129 mg/dL — ABNORMAL HIGH (ref 70–99)
Glucose-Capillary: 175 mg/dL — ABNORMAL HIGH (ref 70–99)
Glucose-Capillary: 129 mg/dL — ABNORMAL HIGH (ref 70–99)

## 2020-12-26 MED ORDER — PROSOURCE PLUS PO LIQD
30.0000 mL | Freq: Three times a day (TID) | ORAL | Status: DC
  Administered 2020-12-26 (×2): 30 mL via ORAL
  Filled 2020-12-26 (×5): qty 30

## 2020-12-26 MED ORDER — ADULT MULTIVITAMIN W/MINERALS CH
1.0000 | ORAL_TABLET | Freq: Every day | ORAL | Status: DC
  Administered 2020-12-26 – 2020-12-28 (×3): 1 via ORAL
  Filled 2020-12-26 (×3): qty 1

## 2020-12-26 MED ORDER — IOHEXOL 9 MG/ML PO SOLN
ORAL | Status: AC
  Filled 2020-12-26: qty 1000

## 2020-12-26 MED ORDER — BOOST / RESOURCE BREEZE PO LIQD CUSTOM
1.0000 | Freq: Three times a day (TID) | ORAL | Status: DC
  Administered 2020-12-26 – 2020-12-28 (×5): 1 via ORAL

## 2020-12-26 MED ORDER — PIPERACILLIN-TAZOBACTAM 3.375 G IVPB
3.3750 g | Freq: Three times a day (TID) | INTRAVENOUS | Status: DC
  Administered 2020-12-26 – 2020-12-27 (×4): 3.375 g via INTRAVENOUS
  Filled 2020-12-26 (×3): qty 50

## 2020-12-26 MED ORDER — FAMOTIDINE IN NACL 20-0.9 MG/50ML-% IV SOLN
20.0000 mg | Freq: Once | INTRAVENOUS | Status: AC
  Administered 2020-12-26: 20 mg via INTRAVENOUS
  Filled 2020-12-26: qty 50

## 2020-12-26 MED ORDER — IOHEXOL 300 MG/ML  SOLN
100.0000 mL | Freq: Once | INTRAMUSCULAR | Status: AC | PRN
  Administered 2020-12-26: 100 mL via INTRAVENOUS

## 2020-12-26 MED ORDER — PROCHLORPERAZINE EDISYLATE 10 MG/2ML IJ SOLN
10.0000 mg | Freq: Once | INTRAMUSCULAR | Status: AC
  Administered 2020-12-26: 10 mg via INTRAVENOUS
  Filled 2020-12-26: qty 2

## 2020-12-26 NOTE — Progress Notes (Signed)
[facility] night shift MedSurg coverage note.  The nursing staff reports that the patient's nausea persists despite getting ondansetron 4 mg IVP at 2237.  She has been admitted due to sepsis secondary to stercoral colitis and clear liquid diet was initiated in the morning.  Compazine 10 mg IVP x1 and famotidine 20 mg IVP x1 ordered.  Tennis Must, MD.

## 2020-12-26 NOTE — Progress Notes (Signed)
Initial Nutrition Assessment  DOCUMENTATION CODES:   Obesity unspecified  INTERVENTION:   -Boost Breeze po TID, each supplement provides 250 kcal and 9 grams of protein -30 ml Prosource Plus TID, each supplement provides 100 kcals and 15 grams protein -MVI with minerals daily -RD will follow for diet advancement and adjust supplement regimen as appropriate -If prolonged NPO/ clear liquid diet status is anticipated, consider initiation of nutrition support  NUTRITION DIAGNOSIS:   Increased nutrient needs related to post-op healing as evidenced by estimated needs.  GOAL:   Patient will meet greater than or equal to 90% of their needs  MONITOR:   PO intake,Supplement acceptance,Diet advancement,Labs,Weight trends,Skin,I & O's  REASON FOR ASSESSMENT:   NPO/Clear Liquid Diet    ASSESSMENT:   [name] is a 60 yo who comes in with abdominal pain that was acute in onset, sharp in nature, and is now constant. It did not get any better during the day and actually got a lot worse.  She has had some associated nausea and vomiting. She reports having some constipation at times but mostly going everyday and having soft stools. She says she has no blood per rectum. She had a colonoscopy 2 years ago that was normal.  Pt admitted with colon perforation and likely stercoral ulcer.   2/12- s/p Hartman's procedure with sigmoid resection and end colostomy 2/14- extubated 2/16- advanced to clear liquid diet  Reviewed I/O's: -46 ml x 24 hours and +8.2 L since admission  UOP: 1.2 L x 24 hours  Per general surgery notes, pt has passed gas though colostomy, however, no output yet.   Pt sitting up in recliner chair with multiple nurses in room. Clear liquid tray in front of her, which was untouched. Noted meal completion 5%.   No fat or muscle depletions observed at this time.   Reviewed wt hx; no wt loss over the past year.   Medications reviewed and include melatonin and lactated  ringers infusion @ 75 ml/hr.  Labs reviewed: CBGS: 120-127 (inpatient orders for glycemic control are none).   NUTRITION - FOCUSED PHYSICAL EXAM:  Flowsheet Row Most Recent Value  Orbital Region No depletion  Upper Arm Region No depletion  Thoracic and Lumbar Region No depletion  Buccal Region No depletion  Temple Region No depletion  Clavicle Bone Region No depletion  Clavicle and Acromion Bone Region No depletion  Scapular Bone Region No depletion  Dorsal Hand No depletion  Patellar Region No depletion  Anterior Thigh Region No depletion  Posterior Calf Region No depletion  Edema (RD Assessment) None  Hair Reviewed  Eyes Reviewed  Mouth Reviewed  Skin Reviewed  Nails Reviewed       Diet Order:   Diet Order            Diet clear liquid Room service appropriate? Yes; Fluid consistency: Thin  Diet effective now                 EDUCATION NEEDS:   No education needs have been identified at this time  Skin:  Skin Assessment: Skin Integrity Issues: Skin Integrity Issues:: Incisions Incisions: closed abdomen  Last BM:  Unknown  Height:   Ht Readings from Last 1 Encounters:  12/22/20 5\' 4"  (1.626 m)    Weight:   Wt Readings from Last 1 Encounters:  12/25/20 89.6 kg    Ideal Body Weight:  54.5 kg  BMI:  Body mass index is 33.91 kg/m.  Estimated Nutritional Needs:   Kcal:  1900-2100  Protein:  120-135 grams  Fluid:  > 1.9 L    Loistine Chance, RD, LDN, [place] Registered Dietitian II Certified Diabetes Care and Education Specialist Please refer to [facility] for RD and/or RD on-call/weekend/after hours pager

## 2020-12-26 NOTE — Progress Notes (Signed)
Pt currently up in recliner chair eating clear liquid lunch. Pt has ambulated 5 feet from bed to wheelchair, down to CT, back to room and ambulated another 5 feet to recliner. Pt tolerated well. IV Dilaudid given per pt request once back from CT scanner. Pt's SaO2 98% on 2 lpm Perdido on early rounds this am, O2 removed at 1000 am and SaO2 rechecked at 1100 am, SaO2 90 -91% on room air with no resp difficulty noted. Pt encouraged to take deep breaths and cough while splinting abdomen. Pt reticent to use IS due to pain, but explained to her that good pulmonary toilet is necessary to prevent pneumonia and to aid healing. Pt states understanding.

## 2020-12-26 NOTE — Progress Notes (Signed)
Pt ambulated 250 ft using FWW and standby assistance. Tolerated well.  Assisted back to bed.  Dressing changed to midline abdominal incision. Wound base beefy red, light pink/serosanguinous drainage on old dressing, small amount. Repacked with wet saline 4x4's and secured with abd pad and fabric tape. Pt tolerated well.

## 2020-12-26 NOTE — Progress Notes (Signed)
[facility] Progress Note  5 Days Post-Op  Subjective: Doing fair. Having some nausea and leukocytosis going up. Some liquid stool in bag.   Objective: Vital signs in last 24 hours: Temp:  [97.4 F (36.3 C)-98.3 F (36.8 C)] 98.3 F (36.8 C) (02/17 0545) Pulse Rate:  [78-89] 82 (02/17 0942) Resp:  [15-20] 20 (02/17 0217) BP: (137-179)/(66-97) 156/73 (02/17 0942) SpO2:  [89 %-100 %] 100 % (02/17 0545) Last BM Date:  (unsure patient is intubated and unable to answer at this time)  Intake/Output from previous day: 02/16 0701 - 02/17 0700 In: 1153.9 [P.O.:250; I.V.:803.9; IV Piggyback:100] Out: 1200 [Urine:1200] Intake/Output this shift: No intake/output data recorded.  General appearance: alert, cooperative and no distress GI: soft, midline with dressing, ostomy pink and edematous, stool liquid in bag  Lab Results:  Recent Labs    12/25/20 0507 12/26/20 0439  WBC 16.7* 17.4*  HGB 9.6* 9.3*  HCT 29.5* 28.4*  PLT 199 231   BMET Recent Labs    12/24/20 0437 12/25/20 0507  NA 136 135  K 3.5 3.4*  CL 99 96*  CO2 29 30  GLUCOSE 110* 113*  BUN 11 14  CREATININE 0.45 0.47  CALCIUM 8.0* 8.4*   PT/INR No results for input(s): LABPROT, INR in the last 72 hours.  Studies/Results: No results found.  Anti-infectives: Anti-infectives (From admission, onward)   Start     Dose/Rate Route Frequency Ordered Stop   12/26/20 1000  piperacillin-tazobactam (ZOSYN) IVPB 3.375 g        3.375 g 12.5 mL/hr over 240 Minutes Intravenous Every 8 hours 12/26/20 0941     12/22/20 0600  cefoTEtan (CEFOTAN) 2 g in sodium chloride 0.9 % 100 mL IVPB        2 g 200 mL/hr over 30 Minutes Intravenous On call to O.R. 12/21/20 1932 12/22/20 0630   12/22/20 0200  piperacillin-tazobactam (ZOSYN) IVPB 3.375 g  Status:  Discontinued        3.375 g 12.5 mL/hr over 240 Minutes Intravenous Every 8 hours 12/21/20 2339 12/26/20 0941   12/21/20 1830  piperacillin-tazobactam (ZOSYN)  IVPB 3.375 g        3.375 g 100 mL/hr over 30 Minutes Intravenous  Once 12/21/20 1829 12/21/20 1933      Assessment/Plan: [name] is a 63 yowith stercoral ulcer perforation s/p Hartman's procedure with end colostomy.  PRN for pain IS, OOB Ostomy pink, clear liquids, some nausea  Zosyn for now, CT to see if abscess that needs drained, discussed with patient and husband  SCDs, Lovenox    LOS: 5 days    Virl Cagey 12/26/2020

## 2020-12-26 NOTE — Consult Note (Signed)
[place] Nurse ostomy follow up Stoma type/location: LLQ colostomy. POD 5. Patient has only been up to chair x1. She is still on liquids. Stomal assessment/size: 1 and 5/8 inches round, raised, red, edematous, lumen in center Peristomal assessment: intact, clear Treatment options for stomal/peristomal skin: skin barrier ring Output: liquid stool  Ostomy pouching: 2pc. 2 and 3/4 inch pouching system and skin barrier ring Education provided:  Demonstrated pouch change (cutting new skin barrier, measuring stoma, cleaning peristomal skin and stoma, use of barrier ring). Patient's husband reports he cannot really see well to cut the skin barrier today and declines. He verbalizes the steps of the change procedure. They may be relying on the patient's sister to perform some of these steps (she is a retired [employer] information officer). Education on emptying when 1/3 to 1/2 full and how to empty. I teach that they will need to be independent in emptying prior to discharge and that the visiting nurse will not be present each time the pouch needs to be emptied Demonstrated use of wick to clean spout  Discussed bathing, diet, gas  Patient's husband is very "mechanical" and can reiterate the steps of the pouch change flawlessly. He can also perform Lock and Roll closure on pouch tail independently.  He could not snap the pouch onto the skin barrier today.  Answered patient/family questions to their expressed satisfaction.    Supplies in room: 5 pouches, 5 skin barriers, 5 skin barrier rings. Two stoma sizing guides.  Enrolled patient in [place] Discharge program: Yes, previously.  Patient will require the services of a [facility] for abdominal wound care and for continuation of ostomy teaching for husband and sister.  If you agree, please arrange.  [place] nursing team will follow, and will remain available to this patient, the nursing and medical teams.   Thanks, Maudie Flakes, MSN, RN, [place], Arther Abbott  Pager# [phone]

## 2020-12-26 NOTE — Progress Notes (Signed)
Patient Demographics:    Shallen Luedke, is a 60 y.o. female, DOB - 02-May-1961, VUY:[id number]  Admit date - 12/21/2020   Admitting Physician Virl Cagey, MD  Outpatient Primary MD for the patient is [name], DO  LOS - 5   No chief complaint on file.       Subjective:    Dolphus Jenny today has no fevers, No chest pain,   Had nausea -There is some fecal material in the ostomy bag -WBC elevated -Hypoxia has resolved  Assessment  & Plan :    Principal Problem:   Sepsis -due to fecal peritonitis Active Problems:   Colon perforation due to stercoral ulcer with fecal peritonitis   Stercoral ulcer of large intestine   Aspiration pneumonia of both lower lobes due to gastric secretions [facility])   Depression   Essential hypertension   Current smoker   Under care of pain management specialist   Brief Summary:- 60 y.o. female with past medical history relevant for HTN, depression/anxiety and chronic pain syndrome, HLD and obesity admitted on 12/21/2020 with with stercoral ulcer perforation --- underwent colectomy with s/p Hartman's procedure with end colostomy on 12/21/20--with aspiration episode intraoperatively, remains hypotensive requiring pressors - Dr. Constance Haw general surgeon requested hospitalist medicine input for management of ongoing medical issues including concerns for aspiration, respiratory failure, hemodynamic instability requiring Levophed for pressure support -On admission patient did meet sepsis criteria  A/p 1) sepsis secondary to fecal peritonitis in the setting of stercoral ulcer-- -hemodynamically improved, off Levophed -Respiratory status improved, extubated on 12/23/2020 -Continue IV Zosyn thru 12/26/20 and IV fluids -Repeat chest x-ray on 12/23/2020 without acute findings specifically no evidence for definite aspiration pneumonia -patient met sepsis criteria on  admission with tachycardia, tachypnea, leukocytosis and bowel perforation with fecal peritonitis  12/26/20 -Ostomy bag with gas and scant amount of liquid stool signifying possible return of bowel function -As per general surgeon okay to continue liquid diet -Nausea improving -CT abdomen and pelvis without frank abscess  2)Stercoral Ulcer perforation with fecal peritonitis and sepsis ---s/p Hartman's procedure with end colostomy-on 12/21/2020 -Postop care per general surgeon -Please see #1 above and #8 below  3) leukocytosis--may be reactive, may be due to #1 above, -Check UA -IV Zosyn as above #1 with last dose on 12/28/2020--IV Zosyn length of treatment extended due to persistent leukocytosis and concerns for underlying intra-abdominal infection  4)Depression/Anxiety/chronic Pain syndrome- --Celexa, BuSpar, trazodone and Elavil -Continue opiates to avoid opiate withdrawal--- PTA patient was on chronic opiates  5)HTN---  BP is not at goal, continue atenolol 50 g daily, hydralazine/isosorbide -Hold lisinopril HCTZ,   may use IV labetalol when necessary  Every 4 hours for systolic blood pressure over 170 mmhg  6) social/ethics--- patient is a full code  7)HLD-- restart Crestor and oral intake resume  8)Hypokalemia/hypomagnesemia--- potassium was down to 3.1, Mg was 1.4----replaced IV, will recheck -c/n potassium replacement  9) elevated LFTs--- in the setting of shock liver, monitor closely  Hepatic Function Latest Ref Rng & Units 12/23/2020 12/22/2020 12/21/2020  Total Protein 6.5 - 8.1 g/dL 4.9(L) 4.9(L) 6.7  Albumin 3.5 - 5.0 g/dL 2.2(L) 2.6(L) 3.6  AST 15 - 41 U/L 32 63(H) 93(H)  ALT 0 - 44 U/L 45(H) 77(H)  86(H)  Alk Phosphatase 38 - 126 U/L 72 79 115  Total Bilirubin 0.3 - 1.2 mg/dL 1.3(H) 1.2 0.8  Bilirubin, Direct 0.00 - 0.40 mg/dL - - -     10)FEN--- -LR as ordered - liquid diet as ordered by general surgeon on 12/25/2020 -Continue Accu-Cheks  11) acute  anemia--hemoglobin is down to just above 9 from a baseline typically above 12 suspect some component of hemodilution from IV fluids as well as acute blood loss anemia due to perioperative and postoperative blood loss -Continue to monitor and transfuse as clinically indicated  --- Prophylaxis--IV Protonix for GI prophylaxis and Lovenox for DVT prophylaxis  Disposition/Need for in-Hospital Stay- patient unable to be discharged at this time due to -sepsis requiring IV antibiotics and IV fluids*  Status is: Inpatient  Remains inpatient appropriate because:Above   Disposition: The patient is from: home              Anticipated d/c is to: Home              Anticipated d/c date is: > 3 days              Patient currently is not medically stable to d/c. Barriers: Not Clinically Stable-   Code Status :  -  Code Status: Full Code   Family Communication:    NA (patient is alert, awake and coherent)   Consults  : General surgery as primary team -Procedures/Lines -Arterial line -Right IJ central line -Foley catheter Intubated 12/21/2020, extubated 12/23/2020  DVT Prophylaxis  :   - SCDs  enoxaparin (LOVENOX) injection 40 mg Start: 12/24/20 1000 SCDs Start: 12/21/20 2340    Lab Results  Component Value Date   PLT 231 12/26/2020    Inpatient Medications  Scheduled Meds: . (feeding supplement) PROSource Plus  30 mL Oral TID BM  . amitriptyline  25 mg Oral QHS  . atenolol  50 mg Oral Daily  . busPIRone  15 mg Oral TID  . Chlorhexidine Gluconate Cloth  6 each Topical Daily  . citalopram  40 mg Oral Daily  . enoxaparin (LOVENOX) injection  40 mg Subcutaneous Daily  . feeding supplement  1 Container Oral TID BM  . hydrALAZINE  50 mg Oral TID  . iohexol      . isosorbide mononitrate  30 mg Oral Daily  . melatonin  6 mg Oral QHS  . multivitamin with minerals  1 tablet Oral Daily  . pantoprazole (PROTONIX) IV  40 mg Intravenous Q24H  . potassium chloride  40 mEq Oral BID  . traZODone   100 mg Oral QHS   Continuous Infusions: . sodium chloride    . lactated ringers 75 mL/hr at 12/26/20 1822  . piperacillin-tazobactam (ZOSYN)  IV 3.375 g (12/26/20 1842)   PRN Meds:.alum & mag hydroxide-simeth, diphenhydrAMINE **OR** diphenhydrAMINE, HYDROmorphone (DILAUDID) injection, labetalol, ondansetron **OR** ondansetron (ZOFRAN) IV, oxyCODONE    Anti-infectives (From admission, onward)   Start     Dose/Rate Route Frequency Ordered Stop   12/26/20 1000  piperacillin-tazobactam (ZOSYN) IVPB 3.375 g        3.375 g 12.5 mL/hr over 240 Minutes Intravenous Every 8 hours 12/26/20 0941     12/22/20 0600  cefoTEtan (CEFOTAN) 2 g in sodium chloride 0.9 % 100 mL IVPB        2 g 200 mL/hr over 30 Minutes Intravenous On call to O.R. 12/21/20 1932 12/22/20 0630   12/22/20 0200  piperacillin-tazobactam (ZOSYN) IVPB 3.375 g  Status:  Discontinued        3.375 g 12.5 mL/hr over 240 Minutes Intravenous Every 8 hours 12/21/20 2339 12/26/20 0941   12/21/20 1830  piperacillin-tazobactam (ZOSYN) IVPB 3.375 g        3.375 g 100 mL/hr over 30 Minutes Intravenous  Once 12/21/20 1829 12/21/20 1933        Objective:   Vitals:   12/26/20 0545 12/26/20 0942 12/26/20 1706 12/26/20 1711  BP:  (!) 156/73 (!) 157/81 (!) 157/81  Pulse: 89 82  89  Resp:  18  18  Temp: 98.3 F (36.8 C)   98.1 F (36.7 C)  TempSrc: Oral     SpO2: 100% 98%  (!) 87%  Weight:      Height:        Wt Readings from Last 3 Encounters:  12/25/20 89.6 kg  09/06/20 79.2 kg  08/16/20 78.9 kg     Intake/Output Summary (Last 24 hours) at 12/26/2020 1925 Last data filed at 12/26/2020 1847 Gross per 24 hour  Intake 720 ml  Output 1700 ml  Net -980 ml   Physical Exam  Physical Examination:  General appearance -awake and alert, Neck-right IJ central line Chest -improving air movement, no wheezing Heart - S1 and S2 normal, regular  Abdomen -postop wound dressing intact, left lower quadrant with colostomy with gas and  scant liquid stool, appropriate postop tenderness Neuro/Psych--affect is appropriate, no gross new focal deficit  extremities - no pedal edema noted, intact peripheral pulses * Skin - warm, dry GU-Foley catheter in situ--removed MSK-arterial line    Data Review:   Micro Results Recent Results (from the past 240 hour(s))  Resp Panel by RT-PCR (Flu A&B, Covid) Nasopharyngeal Swab     Status: None   Collection Time: 12/21/20  6:13 PM   Specimen: Nasopharyngeal Swab; Nasopharyngeal(NP) swabs in vial transport medium  Result Value Ref Range Status   SARS Coronavirus 2 by RT PCR NEGATIVE NEGATIVE Final    Comment: (NOTE) SARS-CoV-2 target nucleic acids are NOT DETECTED.  The SARS-CoV-2 RNA is generally detectable in upper respiratory specimens during the acute phase of infection. The lowest concentration of SARS-CoV-2 viral copies this assay can detect is 138 copies/mL. A negative result does not preclude SARS-Cov-2 infection and should not be used as the sole basis for treatment or other patient management decisions. A negative result may occur with  improper specimen collection/handling, submission of specimen other than nasopharyngeal swab, presence of viral mutation(s) within the areas targeted by this assay, and inadequate number of viral copies(<138 copies/mL). A negative result must be combined with clinical observations, patient history, and epidemiological information. The expected result is Negative.  Fact Sheet for Patients:  EntrepreneurPulse.com.au  Fact Sheet for Healthcare Providers:  IncredibleEmployment.be  This test is no t yet approved or cleared by the [place] FDA and  has been authorized for detection and/or diagnosis of SARS-CoV-2 by FDA under an Emergency Use Authorization (EUA). This EUA will remain  in effect (meaning this test can be used) for the duration of the COVID-19 declaration under Section 564(b)(1) of the  Act, 21 U.S.C.section 360bbb-3(b)(1), unless the authorization is terminated  or revoked sooner.       Influenza A by PCR NEGATIVE NEGATIVE Final   Influenza B by PCR NEGATIVE NEGATIVE Final    Comment: (NOTE) The Xpert Xpress SARS-CoV-2/FLU/RSV plus assay is intended as an aid in the diagnosis of influenza from Nasopharyngeal swab specimens and should not be used as a sole  basis for treatment. Nasal washings and aspirates are unacceptable for Xpert Xpress SARS-CoV-2/FLU/RSV testing.  Fact Sheet for Patients: EntrepreneurPulse.com.au  Fact Sheet for Healthcare Providers: IncredibleEmployment.be  This test is not yet approved or cleared by the [place] FDA and has been authorized for detection and/or diagnosis of SARS-CoV-2 by FDA under an Emergency Use Authorization (EUA). This EUA will remain in effect (meaning this test can be used) for the duration of the COVID-19 declaration under Section 564(b)(1) of the Act, 21 U.S.C. section 360bbb-3(b)(1), unless the authorization is terminated or revoked.  Performed at [facility], [address], [place], [place] [address]   Aerobic/Anaerobic Culture (surgical/deep wound)     Status: None (Preliminary result)   Collection Time: 12/21/20 11:37 PM   Specimen: PATH Other; Body Fluid  Result Value Ref Range Status   Specimen Description   Final    FLUID Performed at [facility], [address] [place], [place] [address]    Special Requests   Final    ABDOMINAL Performed at [facility], [address], [place], [place] [address]    Gram Stain   Final    FEW WBC PRESENT,BOTH PMN AND MONONUCLEAR FEW GRAM NEGATIVE RODS RARE GRAM POSITIVE COCCI IN PAIRS    Culture   Final    RARE ESCHERICHIA COLI STAPHYLOCOCCUS CAPITIS RARE MIXED ANAEROBIC FLORA PRESENT.  CALL LAB IF FURTHER IID REQUIRED. SUSCEPTIBILITIES TO FOLLOW Performed at [facility], [place] [address] [place], [place]  [address]    Report Status PENDING  Incomplete   Organism ID, Bacteria ESCHERICHIA COLI  Final      Susceptibility   Escherichia coli - MIC*    AMPICILLIN 8 SENSITIVE Sensitive     CEFAZOLIN <=4 SENSITIVE Sensitive     CEFEPIME <=0.12 SENSITIVE Sensitive     CEFTAZIDIME <=1 SENSITIVE Sensitive     CEFTRIAXONE <=0.25 SENSITIVE Sensitive     CIPROFLOXACIN <=0.25 SENSITIVE Sensitive     GENTAMICIN <=1 SENSITIVE Sensitive     IMIPENEM <=0.25 SENSITIVE Sensitive     TRIMETH/SULFA <=20 SENSITIVE Sensitive     AMPICILLIN/SULBACTAM 4 SENSITIVE Sensitive     PIP/TAZO <=4 SENSITIVE Sensitive     * RARE ESCHERICHIA COLI    Radiology Reports CT ABDOMEN PELVIS W CONTRAST  Result Date: 12/26/2020 CLINICAL DATA:  Persistent mid to low abdominal pain, abscess or infection suspected, status post colon perforation and resection EXAM: CT ABDOMEN AND PELVIS WITH CONTRAST TECHNIQUE: Multidetector CT imaging of the abdomen and pelvis was performed using the standard protocol following bolus administration of intravenous contrast. CONTRAST:  167m OMNIPAQUE IOHEXOL 300 MG/ML SOLN, additional oral enteric contrast COMPARISON:  12/21/2020 FINDINGS: Lower chest: Small bilateral pleural effusions associated atelectasis or consolidation. Hepatobiliary: No focal liver abnormality is seen. Status post cholecystectomy. Postop biliary dilatation. Pancreas: Unremarkable. No pancreatic ductal dilatation or surrounding inflammatory changes. Spleen: Normal in size without significant abnormality. Adrenals/Urinary Tract: Adrenal glands are unremarkable. Kidneys are normal, without renal calculi, solid lesion, or hydronephrosis. Bladder is unremarkable. Stomach/Bowel: Stomach is within normal limits. There are contrast and air filled, although not overly distended loops of mid to distal small bowel in the mid abdomen, largest caliber 3.6 cm (series 2, image 48). Appendix appears normal. Status post interval sigmoid colon resection and  left lower quadrant end ostomy. There are scattered, tiny air loculations persisting in the right lower quadrant (series 2, image 64). Vascular/Lymphatic: Aortic atherosclerosis. No enlarged abdominal or pelvic lymph nodes. Reproductive: No mass or other significant  abnormality. Other: Low midline laparotomy with open, dressed wound. New small volume ascites throughout the abdomen and pelvis. Anasarca. Musculoskeletal: No acute or significant osseous findings. IMPRESSION: 1. Status post interval sigmoid colon resection and left lower quadrant end ostomy. There are scattered, tiny air loculations persisting in the right lower quadrant in keeping with perforation noted on prior examination. No evidence of discrete intra-abdominal fluid collection or other postoperative complication. 2. There are contrast and air filled, although not overly distended loops of mid to distal small bowel in the mid abdomen, largest caliber 3.6 cm. Findings are most consistent with postoperative ileus. There is gas and stool present to the ostomy 3. New small volume ascites throughout the abdomen and pelvis. Anasarca. 4. New small bilateral pleural effusions and associated atelectasis or consolidation. Aortic Atherosclerosis (ICD10-I70.0). Electronically Signed   By: Eddie Candle M.D.   On: 12/26/2020 15:00   CT ABDOMEN PELVIS W CONTRAST  Result Date: 12/21/2020 CLINICAL DATA:  Abdominal distension, bowel obstruction suspected EXAM: CT ABDOMEN AND PELVIS WITH CONTRAST TECHNIQUE: Multidetector CT imaging of the abdomen and pelvis was performed using the standard protocol following bolus administration of intravenous contrast. CONTRAST:  140m OMNIPAQUE IOHEXOL 300 MG/ML  SOLN COMPARISON:  11/15/2015 FINDINGS: Lower chest: No acute abnormality. Hepatobiliary: No focal liver abnormality is seen. Status post cholecystectomy. Mild postoperative biliary dilatation. Pancreas: Unremarkable. No pancreatic ductal dilatation or surrounding  inflammatory changes. Spleen: Normal in size without significant abnormality. Adrenals/Urinary Tract: Adrenal glands are unremarkable. Kidneys are normal, without renal calculi, solid lesion, or hydronephrosis. Bladder is unremarkable. Stomach/Bowel: The stomach and lower esophagus are fluid-filled. Appendix appears normal in a retrocecal lie. There is a moderate burden of stool throughout the colon, with a large stool ball in the distal sigmoid colon and adjacent extraluminal air (series 2, image 68). Vascular/Lymphatic: Aortic atherosclerosis. No enlarged abdominal or pelvic lymph nodes. Reproductive: Status post hysterectomy. Other: No abdominal wall hernia or abnormality. Small volume pneumoperitoneum and retroperitoneal emphysema. Trace fluid in the right lower quadrant. Musculoskeletal: No acute or significant osseous findings. IMPRESSION: 1. There is a moderate burden of stool throughout the colon, with a large stool ball in the distal sigmoid colon and adjacent extraluminal air throughout the mesentery and retroperitoneum. Findings are consistent with bowel perforation, likely secondary to stercoral ulceration. An obstipating mass of the distal sigmoid would be difficult to exclude and could be further investigated by colonoscopy. 2. Small volume pneumoperitoneum and fluid in the right lower quadrant. These results were called by telephone at the time of interpretation on 12/21/2020 at 6:14 pm to PA [facility], who verbally acknowledged these results. Aortic Atherosclerosis (ICD10-I70.0). Electronically Signed   By: AEddie CandleM.D.   On: 12/21/2020 18:14   DG CHEST PORT 1 VIEW  Result Date: 12/23/2020 CLINICAL DATA:  Dyspnea. EXAM: PORTABLE CHEST 1 VIEW COMPARISON:  December 22, 2020. FINDINGS: Endotracheal tube tip approximately 13 mm above the carina. Right IJ central venous catheter tip projecting at the right atrium. Gastric tube courses below the diaphragm outside the field of view. Free air  seen on prior radiograph and on the prior CT is less conspicuous on this exam. Similar streaky bibasilar opacities with low lung volumes. No new confluent consolidation. No visible pleural effusions or pneumothorax. Similar cardiomediastinal silhouette. Aortic atherosclerosis. Partially imaged ACDF. IMPRESSION: 1. Endotracheal tube tip approximately 13 mm above the carina. Retraction by approximately 2.5 cm would place the tip at the level of the clavicular heads. 2. Similar streaky bibasilar opacities,  likely atelectasis. 3. Free air seen on prior radiograph and on the prior CT is less conspicuous on this exam. Electronically Signed   By: Margaretha Sheffield MD   On: 12/23/2020 07:50   DG Chest Port 1 View  Result Date: 12/22/2020 CLINICAL DATA:  Endotracheal tube placement. EXAM: PORTABLE CHEST 1 VIEW COMPARISON:  Chest radiograph 06/06/2020, included lung bases from abdominal CT earlier today. FINDINGS: Endotracheal tube tip 13 mm from the carina. Tip and side port of the enteric tube below the diaphragm in the stomach. Right central line tip in the right atrium. Heart is normal in size. Curvilinear lucency abutting the right heart border corresponds to free air under the hemidiaphragm. Bibasilar atelectasis, increased. No pneumothorax. No significant pleural effusion. IMPRESSION: 1. Endotracheal tube tip 13 mm from the carina. Enteric tube tip and side port below the diaphragm in the stomach. 2. Right central line tip in the right atrium. 3. Increased bibasilar atelectasis. 4. Known free air in the upper abdomen characterized on CT. Electronically Signed   By: Keith Rake M.D.   On: 12/22/2020 00:11     CBC Recent Labs  Lab 12/21/20 1650 12/22/20 0339 12/23/20 0411 12/24/20 0437 12/25/20 0507 12/26/20 0439  WBC 13.8* 10.2 12.3* 13.5* 16.7* 17.4*  HGB 14.0 12.7 9.6* 9.1* 9.6* 9.3*  HCT 43.3 38.6 29.4* 27.9* 29.5* 28.4*  PLT 266 214 147* 153 199 231  MCV 88.0 88.7 89.1 89.4 88.1 87.4  MCH  28.5 29.2 29.1 29.2 28.7 28.6  MCHC 32.3 32.9 32.7 32.6 32.5 32.7  RDW 13.2 13.2 13.6 13.4 13.2 13.4  LYMPHSABS 0.9 1.0  --  1.7  --  3.7  MONOABS 0.7 0.5  --  0.8  --  1.9*  EOSABS 0.1 0.0  --  1.0*  --  0.5  BASOSABS 0.1 0.0  --  0.1  --  0.0    Chemistries  Recent Labs  Lab 12/21/20 1650 12/22/20 0339 12/23/20 0411 12/24/20 0437 12/25/20 0507  NA 136 137 135 136 135  K 3.6 3.1* 2.8* 3.5 3.4*  CL 98 96* 96* 99 96*  CO2 '29 29 31 29 30  ' GLUCOSE 133* 148* 136* 110* 113*  BUN '20 16 12 11 14  ' CREATININE 0.63 0.71 0.67 0.45 0.47  CALCIUM 9.0 8.1* 7.8* 8.0* 8.4*  MG  --  1.4*  --   --   --   AST 93* 63* 32  --   --   ALT 86* 77* 45*  --   --   ALKPHOS 115 79 72  --   --   BILITOT 0.8 1.2 1.3*  --   --    ------------------------------------------------------------------------------------------------------------------ No results for input(s): CHOL, HDL, LDLCALC, TRIG, CHOLHDL, LDLDIRECT in the last 72 hours.  Lab Results  Component Value Date   HGBA1C 5.6 05/30/2020   ------------------------------------------------------------------------------------------------------------------ No results for input(s): TSH, T4TOTAL, T3FREE, THYROIDAB in the last 72 hours.  Invalid input(s): FREET3 ------------------------------------------------------------------------------------------------------------------ No results for input(s): VITAMINB12, FOLATE, FERRITIN, TIBC, IRON, RETICCTPCT in the last 72 hours.  Coagulation profile No results for input(s): INR, PROTIME in the last 168 hours.  No results for input(s): DDIMER in the last 72 hours.  Cardiac Enzymes No results for input(s): CKMB, TROPONINI, MYOGLOBIN in the last 168 hours.  Invalid input(s): CK ------------------------------------------------------------------------------------------------------------------ No results found for: BNP   Roxan Hockey M.D on 12/26/2020 at 7:25 PM  Go to [email or web address] - for contact  info  [facility] - Office  [phone]

## 2020-12-27 DIAGNOSIS — K631 Perforation of intestine (nontraumatic): Secondary | ICD-10-CM | POA: Diagnosis not present

## 2020-12-27 DIAGNOSIS — A419 Sepsis, unspecified organism: Secondary | ICD-10-CM | POA: Diagnosis not present

## 2020-12-27 DIAGNOSIS — F331 Major depressive disorder, recurrent, moderate: Secondary | ICD-10-CM | POA: Diagnosis not present

## 2020-12-27 DIAGNOSIS — K633 Ulcer of intestine: Secondary | ICD-10-CM | POA: Diagnosis not present

## 2020-12-27 LAB — COMPREHENSIVE METABOLIC PANEL
ALT: 24 U/L (ref 0–44)
AST: 18 U/L (ref 15–41)
Albumin: 2.3 g/dL — ABNORMAL LOW (ref 3.5–5.0)
Alkaline Phosphatase: 62 U/L (ref 38–126)
Anion gap: 6 (ref 5–15)
BUN: 11 mg/dL (ref 8–23)
CO2: 30 mmol/L (ref 22–32)
Calcium: 8.2 mg/dL — ABNORMAL LOW (ref 8.9–10.3)
Chloride: 99 mmol/L (ref 98–111)
Creatinine, Ser: 0.44 mg/dL (ref 0.44–1.00)
GFR, Estimated: >60 mL/min (ref >60)
Glucose, Bld: 137 mg/dL — ABNORMAL HIGH (ref 70–99)
Potassium: 3.6 mmol/L (ref 3.5–5.1)
Sodium: 135 mmol/L (ref 135–145)
Total Bilirubin: 0.6 mg/dL (ref 0.3–1.2)
Total Protein: 5.4 g/dL — ABNORMAL LOW (ref 6.5–8.1)

## 2020-12-27 LAB — GLUCOSE, CAPILLARY
Glucose-Capillary: 107 mg/dL — ABNORMAL HIGH (ref 70–99)
Glucose-Capillary: 128 mg/dL — ABNORMAL HIGH (ref 70–99)
Glucose-Capillary: 129 mg/dL — ABNORMAL HIGH (ref 70–99)
Glucose-Capillary: 140 mg/dL — ABNORMAL HIGH (ref 70–99)
Glucose-Capillary: 156 mg/dL — ABNORMAL HIGH (ref 70–99)

## 2020-12-27 LAB — CBC WITH DIFFERENTIAL/PLATELET
Basophils Absolute: 0 10*3/uL (ref 0.0–0.1)
Basophils Relative: 0 %
Eosinophils Absolute: 1 10*3/uL — ABNORMAL HIGH (ref 0.0–0.5)
Eosinophils Relative: 6 %
HCT: 28.6 % — ABNORMAL LOW (ref 36.0–46.0)
Hemoglobin: 9.3 g/dL — ABNORMAL LOW (ref 12.0–15.0)
Lymphocytes Relative: 19 %
Lymphs Abs: 3.3 10*3/uL (ref 0.7–4.0)
MCH: 29 pg (ref 26.0–34.0)
MCHC: 32.5 g/dL (ref 30.0–36.0)
MCV: 89.1 fL (ref 80.0–100.0)
Metamyelocytes Relative: 1 %
Monocytes Absolute: 2.4 10*3/uL — ABNORMAL HIGH (ref 0.1–1.0)
Monocytes Relative: 14 %
Myelocytes: 4 %
Neutro Abs: 9.7 10*3/uL — ABNORMAL HIGH (ref 1.7–7.7)
Neutrophils Relative %: 56 %
Platelets: 281 10*3/uL (ref 150–400)
RBC: 3.21 MIL/uL — ABNORMAL LOW (ref 3.87–5.11)
RDW: 13.8 % (ref 11.5–15.5)
WBC: 17.3 10*3/uL — ABNORMAL HIGH (ref 4.0–10.5)
nRBC: 0.2 % (ref 0.0–0.2)

## 2020-12-27 LAB — URINE CULTURE
Culture: NO GROWTH
Special Requests: NORMAL

## 2020-12-27 MED ORDER — OXYCODONE HCL 5 MG PO TABS: 5.0000 mg | ORAL_TABLET | ORAL | 0 refills | Status: DC | PRN

## 2020-12-27 MED ORDER — ACETAMINOPHEN 500 MG PO TABS
1000.0000 mg | ORAL_TABLET | Freq: Four times a day (QID) | ORAL | Status: DC
  Administered 2020-12-27 – 2020-12-28 (×2): 1000 mg via ORAL
  Filled 2020-12-27 (×3): qty 2

## 2020-12-27 MED ORDER — AMOXICILLIN-POT CLAVULANATE 875-125 MG PO TABS
1.0000 | ORAL_TABLET | Freq: Two times a day (BID) | ORAL | Status: DC
  Administered 2020-12-27 – 2020-12-28 (×3): 1 via ORAL
  Filled 2020-12-27 (×3): qty 1

## 2020-12-27 MED ORDER — TIZANIDINE HCL 4 MG PO TABS
4.0000 mg | ORAL_TABLET | Freq: Three times a day (TID) | ORAL | Status: DC
  Administered 2020-12-27 – 2020-12-28 (×3): 4 mg via ORAL
  Filled 2020-12-27 (×3): qty 1

## 2020-12-27 MED ORDER — ALPRAZOLAM 1 MG PO TABS
1.0000 mg | ORAL_TABLET | Freq: Once | ORAL | Status: AC
  Administered 2020-12-27: 1 mg via ORAL
  Filled 2020-12-27: qty 1

## 2020-12-27 MED ORDER — HYDROMORPHONE HCL 1 MG/ML IJ SOLN: 1.0000 mg | INTRAMUSCULAR | Status: DC | PRN

## 2020-12-27 MED ORDER — AMOXICILLIN-POT CLAVULANATE 875-125 MG PO TABS: 1.0000 | ORAL_TABLET | Freq: Two times a day (BID) | ORAL | 0 refills | Status: AC

## 2020-12-27 NOTE — Progress Notes (Addendum)
[facility] Progress Note  6 Days Post-Op  Subjective: Having more output. Worked with PT and ambulated. Pain still an issue at time.  No fevers but leukocytosis remains, potential acute phase reaction as there is no drainable collection and wound looks good.   Objective: Vital signs in last 24 hours: Temp:  [98.1 F (36.7 C)-98.5 F (36.9 C)] 98.2 F (36.8 C) (02/18 0540) Pulse Rate:  [89-101] 101 (02/18 0540) Resp:  [18-19] 18 (02/18 0540) BP: (147-159)/(81) 147/81 (02/18 0540) SpO2:  [87 %-97 %] 97 % (02/18 0540) Last BM Date: 12/27/20  Intake/Output from previous day: 02/17 0701 - 02/18 0700 In: 720 [P.O.:720] Out: 1300 [Urine:1100; Stool:200] Intake/Output this shift: Total I/O In: 480 [P.O.:480] Out: 150 [Stool:150]  General appearance: alert, cooperative and no distress Resp: normal work of breathing GI: soft, midlind incision c/d/i with healthy tissue, ostomy bag leaking, and removed, ostomy pink and making stool, wafer changed  Lab Results:  Recent Labs    12/26/20 0439 12/27/20 0545  WBC 17.4* 17.3*  HGB 9.3* 9.3*  HCT 28.4* 28.6*  PLT 231 281   BMET Recent Labs    12/25/20 0507 12/27/20 0545  NA 135 135  K 3.4* 3.6  CL 96* 99  CO2 30 30  GLUCOSE 113* 137*  BUN 14 11  CREATININE 0.47 0.44  CALCIUM 8.4* 8.2*   PT/INR No results for input(s): LABPROT, INR in the last 72 hours.  Studies/Results: CT ABDOMEN PELVIS W CONTRAST  Result Date: 12/26/2020 CLINICAL DATA:  Persistent mid to low abdominal pain, abscess or infection suspected, status post colon perforation and resection EXAM: CT ABDOMEN AND PELVIS WITH CONTRAST TECHNIQUE: Multidetector CT imaging of the abdomen and pelvis was performed using the standard protocol following bolus administration of intravenous contrast. CONTRAST:  137mL OMNIPAQUE IOHEXOL 300 MG/ML SOLN, additional oral enteric contrast COMPARISON:  12/21/2020 FINDINGS: Lower chest: Small bilateral pleural  effusions associated atelectasis or consolidation. Hepatobiliary: No focal liver abnormality is seen. Status post cholecystectomy. Postop biliary dilatation. Pancreas: Unremarkable. No pancreatic ductal dilatation or surrounding inflammatory changes. Spleen: Normal in size without significant abnormality. Adrenals/Urinary Tract: Adrenal glands are unremarkable. Kidneys are normal, without renal calculi, solid lesion, or hydronephrosis. Bladder is unremarkable. Stomach/Bowel: Stomach is within normal limits. There are contrast and air filled, although not overly distended loops of mid to distal small bowel in the mid abdomen, largest caliber 3.6 cm (series 2, image 48). Appendix appears normal. Status post interval sigmoid colon resection and left lower quadrant end ostomy. There are scattered, tiny air loculations persisting in the right lower quadrant (series 2, image 64). Vascular/Lymphatic: Aortic atherosclerosis. No enlarged abdominal or pelvic lymph nodes. Reproductive: No mass or other significant abnormality. Other: Low midline laparotomy with open, dressed wound. New small volume ascites throughout the abdomen and pelvis. Anasarca. Musculoskeletal: No acute or significant osseous findings. IMPRESSION: 1. Status post interval sigmoid colon resection and left lower quadrant end ostomy. There are scattered, tiny air loculations persisting in the right lower quadrant in keeping with perforation noted on prior examination. No evidence of discrete intra-abdominal fluid collection or other postoperative complication. 2. There are contrast and air filled, although not overly distended loops of mid to distal small bowel in the mid abdomen, largest caliber 3.6 cm. Findings are most consistent with postoperative ileus. There is gas and stool present to the ostomy 3. New small volume ascites throughout the abdomen and pelvis. Anasarca. 4. New small bilateral pleural effusions and associated atelectasis or consolidation.  Aortic Atherosclerosis (ICD10-I70.0). Electronically Signed   By: Eddie Candle M.D.   On: 12/26/2020 15:00    Anti-infectives: Anti-infectives (From admission, onward)   Start     Dose/Rate Route Frequency Ordered Stop   12/27/20 1345  amoxicillin-clavulanate (AUGMENTIN) 875-125 MG per tablet 1 tablet        1 tablet Oral Every 12 hours 12/27/20 1246 12/29/20 2159   12/26/20 1000  piperacillin-tazobactam (ZOSYN) IVPB 3.375 g  Status:  Discontinued        3.375 g 12.5 mL/hr over 240 Minutes Intravenous Every 8 hours 12/26/20 0941 12/27/20 1246   12/22/20 0600  cefoTEtan (CEFOTAN) 2 g in sodium chloride 0.9 % 100 mL IVPB        2 g 200 mL/hr over 30 Minutes Intravenous On call to O.R. 12/21/20 1932 12/22/20 0630   12/22/20 0200  piperacillin-tazobactam (ZOSYN) IVPB 3.375 g  Status:  Discontinued        3.375 g 12.5 mL/hr over 240 Minutes Intravenous Every 8 hours 12/21/20 2339 12/26/20 0941   12/21/20 1830  piperacillin-tazobactam (ZOSYN) IVPB 3.375 g        3.375 g 100 mL/hr over 30 Minutes Intravenous  Once 12/21/20 1829 12/21/20 1933      Assessment/Plan: [name] is a 38 yowith stercoral ulcer perforation s/p Hartman's procedure with end colostomy.  PRN for pain and is on chronic pain medication, will send a few more pills home given potential need for more, roxicodone 5 q 4 PRN # 20 sent in to pharmacy  Scheduled tylenol  IS, OOB Ostomy pink, soft diet Will continue augmentin for 5 more  Days given leukocytosis but no clear fluid collection, E coli grew in her cultures intraop, Augmentin sent in for home [facility] RN, [facility] PT and walker ordered  SCDs, Lovenox  Dr. Terri Piedra rounding tomorrow.    LOS: 6 days    Virl Cagey 12/27/2020

## 2020-12-27 NOTE — Progress Notes (Addendum)
Patient Demographics:    [name], is a 60 y.o. female, DOB - 04-Feb-1961, UUV:[id number]  Admit date - 12/21/2020   Admitting Physician Virl Cagey, MD  Outpatient Primary MD for the patient is [name], DO  LOS - 6   No chief complaint on file.       Subjective:    [name] today has no fevers, No chest pain,   abd pain is better Tolerating oral intake better   Assessment  & Plan :    Principal Problem:   E. coli sepsis -due to fecal peritonitis Active Problems:   Colon perforation due to stercoral ulcer with E. coli fecal peritonitis   Stercoral ulcer of large intestine   Aspiration pneumonia of both lower lobes due to gastric secretions [facility])   Depression   Essential hypertension   Current smoker   Under care of pain management specialist   Brief Summary:- 60 y.o. female with past medical history relevant for HTN, depression/anxiety and chronic pain syndrome, HLD and obesity admitted on 12/21/2020 with with stercoral ulcer perforation --- underwent colectomy with s/p Hartman's procedure with end colostomy on 12/21/20--with aspiration episode intraoperatively, remains hypotensive requiring pressors - Dr. Constance Haw general surgeon requested hospitalist medicine input for management of ongoing medical issues including concerns for acute respiratory failure, hemodynamic instability requiring Levophed for pressure support and sepsis due to fecal peritonitis -On admission patient did meet sepsis criteria  A/p 1) sepsis secondary to fecal peritonitis (E. coli ) in the setting of stercoral ulcer--POA -OR peritoneal fluid from 12/21/2020 with pansensitive E. Coli -Urine culture from 12/25/2020 negative -hemodynamically improved, off Levophed -Respiratory status improved, extubated on 12/23/2020 -Treated with  IV Zosyn thru 12/27/20 --switch to Augmentin for 5 days -Repeat  chest x-ray on 12/23/2020 without acute findings specifically no evidence for definite aspiration pneumonia -patient met sepsis criteria on admission with tachycardia, tachypnea, leukocytosis and bowel perforation with fecal peritonitis  12/27/20 -Ostomy bag with liquid stool signifying  return of bowel function -Okay to advance diet as per general surgeon  -Repeat CT abdomen and pelvis from 12/26/2020 without frank abscess  2)Stercoral Ulcer perforation with fecal peritonitis and sepsis ---s/p Hartman's procedure with end colostomy-on 12/21/2020 -Postop care per general surgeon -Please see #1 above and #8 below  3) leukocytosis--may be reactive, may be due to #1 above - -IV Zosyn and p.o. Augmentin as above  -extended antibiotic therapy due to persistent leukocytosis and concerns for underlying intra-abdominal infection  4)Depression/Anxiety/chronic Pain syndrome- --Celexa, BuSpar, trazodone and Elavil, Zanaflex -Continue opiates to avoid opiate withdrawal--- PTA patient was on chronic opiates -Patient has a history of violating her narcotic contract in the past -Please be judicious with opiates and benzos  5)HTN---  continue atenolol 50 g daily, hydralazine/isosorbide -Hold lisinopril HCTZ,   may use IV labetalol when necessary  Every 4 hours for systolic blood pressure over 170 mmhg  6) social/ethics--- patient is a full code  7)HLD-- restart Crestor upon discharge when oral intake is more reliable   8)Hypokalemia/hypomagnesemia---  normalized after replacement  9) elevated LFTs--- in the setting of shock liver,  -LFTs improving  Hepatic Function Latest Ref Rng & Units 12/27/2020 12/23/2020 12/22/2020  Total Protein 6.5 - 8.1 g/dL 5.4(L) 4.9(L)  4.9(L)  Albumin 3.5 - 5.0 g/dL 2.3(L) 2.2(L) 2.6(L)  AST 15 - 41 U/L 18 32 63(H)  ALT 0 - 44 U/L 24 45(H) 77(H)  Alk Phosphatase 38 - 126 U/L 62 72 79  Total Bilirubin 0.3 - 1.2 mg/dL 0.6 1.3(H) 1.2  Bilirubin, Direct 0.00 - 0.40  mg/dL - - -     10)FEN--- -okay to wean off IV fluids as oral intake improves - diet as ordered by general surgeon  -Continue Accu-Cheks  11) acute anemia--hemoglobin is down to just above 9 from a baseline typically above 12--- suspect some component of hemodilution from IV fluids as well as acute blood loss anemia due to perioperative and postoperative blood loss -Continue to monitor and transfuse as clinically indicated --- Prophylaxis--IV Protonix for GI prophylaxis and Lovenox for DVT prophylaxis  Disposition/Need for in-Hospital Stay- patient unable to be discharged at this time due to -sepsis requiring IV antibiotics  Status is: Inpatient  Remains inpatient appropriate because:Above   Disposition: The patient is from: home              Anticipated d/c is to: Home with home health              Anticipated d/c date is: 2 days              Patient currently is not medically stable to d/c. Barriers: Not Clinically Stable-   Code Status :  -  Code Status: Full Code   Family Communication:   (patient is alert, awake and coherent)  Discussed with son at bedside  Consults  : General surgery as primary team -Procedures/Lines -Arterial line -Right IJ central line -Foley catheter Intubated 12/21/2020, extubated 12/23/2020  DVT Prophylaxis  :   - SCDs  enoxaparin (LOVENOX) injection 40 mg Start: 12/24/20 1000 SCDs Start: 12/21/20 2340    Lab Results  Component Value Date   PLT 281 12/27/2020    Inpatient Medications  Scheduled Meds: . (feeding supplement) PROSource Plus  30 mL Oral TID BM  . amitriptyline  25 mg Oral QHS  . atenolol  50 mg Oral Daily  . busPIRone  15 mg Oral TID  . Chlorhexidine Gluconate Cloth  6 each Topical Daily  . citalopram  40 mg Oral Daily  . enoxaparin (LOVENOX) injection  40 mg Subcutaneous Daily  . feeding supplement  1 Container Oral TID BM  . hydrALAZINE  50 mg Oral TID  . isosorbide mononitrate  30 mg Oral Daily  . melatonin  6 mg  Oral QHS  . multivitamin with minerals  1 tablet Oral Daily  . pantoprazole (PROTONIX) IV  40 mg Intravenous Q24H  . potassium chloride  40 mEq Oral BID  . traZODone  100 mg Oral QHS   Continuous Infusions: . sodium chloride    . piperacillin-tazobactam (ZOSYN)  IV 3.375 g (12/27/20 0849)   PRN Meds:.alum & mag hydroxide-simeth, diphenhydrAMINE **OR** diphenhydrAMINE, HYDROmorphone (DILAUDID) injection, labetalol, ondansetron **OR** ondansetron (ZOFRAN) IV, oxyCODONE    Anti-infectives (From admission, onward)   Start     Dose/Rate Route Frequency Ordered Stop   12/26/20 1000  piperacillin-tazobactam (ZOSYN) IVPB 3.375 g        3.375 g 12.5 mL/hr over 240 Minutes Intravenous Every 8 hours 12/26/20 0941 12/28/20 2359   12/22/20 0600  cefoTEtan (CEFOTAN) 2 g in sodium chloride 0.9 % 100 mL IVPB        2 g 200 mL/hr over 30 Minutes Intravenous On call to O.R.  12/21/20 1932 12/22/20 0630   12/22/20 0200  piperacillin-tazobactam (ZOSYN) IVPB 3.375 g  Status:  Discontinued        3.375 g 12.5 mL/hr over 240 Minutes Intravenous Every 8 hours 12/21/20 2339 12/26/20 0941   12/21/20 1830  piperacillin-tazobactam (ZOSYN) IVPB 3.375 g        3.375 g 100 mL/hr over 30 Minutes Intravenous  Once 12/21/20 1829 12/21/20 1933        Objective:   Vitals:   12/26/20 1706 12/26/20 1711 12/26/20 2121 12/27/20 0540  BP: (!) 157/81 (!) 157/81 (!) 159/81 (!) 147/81  Pulse:  89 91 (!) 101  Resp:  '18 19 18  ' Temp:  98.1 F (36.7 C) 98.5 F (36.9 C) 98.2 F (36.8 C)  TempSrc:   Oral Oral  SpO2:  (!) 87% 92% 97%  Weight:      Height:        Wt Readings from Last 3 Encounters:  12/25/20 89.6 kg  09/06/20 79.2 kg  08/16/20 78.9 kg     Intake/Output Summary (Last 24 hours) at 12/27/2020 1239 Last data filed at 12/27/2020 1100 Gross per 24 hour  Intake 1200 ml  Output 1250 ml  Net -50 ml   Physical Exam  Physical Examination:  General appearance -awake and alert, in no apparent distress   Neck-right IJ central line-removed 12/27/2020 Chest -improving air movement, no wheezing Heart - S1 and S2 normal, regular  Abdomen -postop wound dressing intact, left lower quadrant with colostomy with gas and scant liquid stool, appropriate postop tenderness Neuro/Psych--affect is appropriate, no gross new focal deficit  extremities - no pedal edema noted, intact peripheral pulses * Skin - warm, dry   Data Review:   Micro Results Recent Results (from the past 240 hour(s))  Resp Panel by RT-PCR (Flu A&B, Covid) Nasopharyngeal Swab     Status: None   Collection Time: 12/21/20  6:13 PM   Specimen: Nasopharyngeal Swab; Nasopharyngeal(NP) swabs in vial transport medium  Result Value Ref Range Status   SARS Coronavirus 2 by RT PCR NEGATIVE NEGATIVE Final    Comment: (NOTE) SARS-CoV-2 target nucleic acids are NOT DETECTED.  The SARS-CoV-2 RNA is generally detectable in upper respiratory specimens during the acute phase of infection. The lowest concentration of SARS-CoV-2 viral copies this assay can detect is 138 copies/mL. A negative result does not preclude SARS-Cov-2 infection and should not be used as the sole basis for treatment or other patient management decisions. A negative result may occur with  improper specimen collection/handling, submission of specimen other than nasopharyngeal swab, presence of viral mutation(s) within the areas targeted by this assay, and inadequate number of viral copies(<138 copies/mL). A negative result must be combined with clinical observations, patient history, and epidemiological information. The expected result is Negative.  Fact Sheet for Patients:  EntrepreneurPulse.com.au  Fact Sheet for Healthcare Providers:  IncredibleEmployment.be  This test is no t yet approved or cleared by the [place] FDA and  has been authorized for detection and/or diagnosis of SARS-CoV-2 by FDA under an Emergency Use  Authorization (EUA). This EUA will remain  in effect (meaning this test can be used) for the duration of the COVID-19 declaration under Section 564(b)(1) of the Act, 21 U.S.C.section 360bbb-3(b)(1), unless the authorization is terminated  or revoked sooner.       Influenza A by PCR NEGATIVE NEGATIVE Final   Influenza B by PCR NEGATIVE NEGATIVE Final    Comment: (NOTE) The Xpert Xpress SARS-CoV-2/FLU/RSV plus assay is  intended as an aid in the diagnosis of influenza from Nasopharyngeal swab specimens and should not be used as a sole basis for treatment. Nasal washings and aspirates are unacceptable for Xpert Xpress SARS-CoV-2/FLU/RSV testing.  Fact Sheet for Patients: EntrepreneurPulse.com.au  Fact Sheet for Healthcare Providers: IncredibleEmployment.be  This test is not yet approved or cleared by the [place] FDA and has been authorized for detection and/or diagnosis of SARS-CoV-2 by FDA under an Emergency Use Authorization (EUA). This EUA will remain in effect (meaning this test can be used) for the duration of the COVID-19 declaration under Section 564(b)(1) of the Act, 21 U.S.C. section 360bbb-3(b)(1), unless the authorization is terminated or revoked.  Performed at [facility], [address], [place], [place] [address]   Aerobic/Anaerobic Culture (surgical/deep wound)     Status: None (Preliminary result)   Collection Time: 12/21/20 11:37 PM   Specimen: PATH Other; Body Fluid  Result Value Ref Range Status   Specimen Description   Final    FLUID Performed at [facility], [address], [place], [place] [address]    Special Requests   Final    ABDOMINAL Performed at [facility], [address] [place], [place] [address]    Gram Stain   Final    FEW WBC PRESENT,BOTH PMN AND MONONUCLEAR FEW GRAM NEGATIVE RODS RARE GRAM POSITIVE COCCI IN PAIRS    Culture   Final    RARE ESCHERICHIA COLI RARE STAPHYLOCOCCUS CAPITIS RARE  MIXED ANAEROBIC FLORA PRESENT.  CALL LAB IF FURTHER IID REQUIRED. SUSCEPTIBILITIES TO FOLLOW Performed at [facility], [place] [address], [place], [place] [address]    Report Status PENDING  Incomplete   Organism ID, Bacteria ESCHERICHIA COLI  Final      Susceptibility   Escherichia coli - MIC*    AMPICILLIN 8 SENSITIVE Sensitive     CEFAZOLIN <=4 SENSITIVE Sensitive     CEFEPIME <=0.12 SENSITIVE Sensitive     CEFTAZIDIME <=1 SENSITIVE Sensitive     CEFTRIAXONE <=0.25 SENSITIVE Sensitive     CIPROFLOXACIN <=0.25 SENSITIVE Sensitive     GENTAMICIN <=1 SENSITIVE Sensitive     IMIPENEM <=0.25 SENSITIVE Sensitive     TRIMETH/SULFA <=20 SENSITIVE Sensitive     AMPICILLIN/SULBACTAM 4 SENSITIVE Sensitive     PIP/TAZO <=4 SENSITIVE Sensitive     * RARE ESCHERICHIA COLI  Urine Culture     Status: None   Collection Time: 12/25/20  5:56 PM   Specimen: Urine, Clean Catch  Result Value Ref Range Status   Specimen Description   Final    URINE, CLEAN CATCH Performed at [facility], [address] [place], [place] [address]    Special Requests   Final    Normal Performed at [facility], [address] [place], [place] [address]    Culture   Final    NO GROWTH Performed at [facility], [place] [address], [place], [place] [address]    Report Status 12/27/2020 FINAL  Final    Radiology Reports CT ABDOMEN PELVIS W CONTRAST  Result Date: 12/26/2020 CLINICAL DATA:  Persistent mid to low abdominal pain, abscess or infection suspected, status post colon perforation and resection EXAM: CT ABDOMEN AND PELVIS WITH CONTRAST TECHNIQUE: Multidetector CT imaging of the abdomen and pelvis was performed using the standard protocol following bolus administration of intravenous contrast. CONTRAST:  115m OMNIPAQUE IOHEXOL 300 MG/ML SOLN, additional oral enteric contrast COMPARISON:  12/21/2020 FINDINGS: Lower chest: Small bilateral pleural effusions associated atelectasis or consolidation.  Hepatobiliary: No focal liver abnormality is seen. Status post cholecystectomy. Postop biliary dilatation. Pancreas: Unremarkable. No pancreatic ductal dilatation or surrounding inflammatory changes. Spleen: Normal in size without significant abnormality. Adrenals/Urinary Tract: Adrenal glands are unremarkable. Kidneys are normal, without renal calculi, solid lesion, or hydronephrosis. Bladder is unremarkable. Stomach/Bowel: Stomach is within normal limits. There are contrast and air filled, although not overly distended loops of mid to distal small bowel in the mid abdomen, largest caliber 3.6 cm (series 2, image 48). Appendix appears normal. Status post interval sigmoid colon resection and left lower quadrant end ostomy. There are scattered, tiny air loculations persisting in the right lower quadrant (series 2, image 64). Vascular/Lymphatic: Aortic atherosclerosis. No enlarged abdominal or pelvic lymph nodes. Reproductive: No mass or other significant abnormality. Other: Low midline laparotomy with open, dressed wound. New small volume ascites throughout the abdomen and pelvis. Anasarca. Musculoskeletal: No acute or significant osseous findings. IMPRESSION: 1. Status post interval sigmoid colon resection and left lower quadrant end ostomy. There are scattered, tiny air loculations persisting in the right lower quadrant in keeping with perforation noted on prior examination. No evidence of discrete intra-abdominal fluid collection or other postoperative complication. 2. There are contrast and air filled, although not overly distended loops of mid to distal small bowel in the mid abdomen, largest caliber 3.6 cm. Findings are most consistent with postoperative ileus. There is gas and stool present to the ostomy 3. New small volume ascites throughout the abdomen and pelvis. Anasarca. 4. New small bilateral pleural effusions and associated atelectasis or consolidation. Aortic Atherosclerosis (ICD10-I70.0).  Electronically Signed   By: Eddie Candle M.D.   On: 12/26/2020 15:00   CT ABDOMEN PELVIS W CONTRAST  Result Date: 12/21/2020 CLINICAL DATA:  Abdominal distension, bowel obstruction suspected EXAM: CT ABDOMEN AND PELVIS WITH CONTRAST TECHNIQUE: Multidetector CT imaging of the abdomen and pelvis was performed using the standard protocol following bolus administration of intravenous contrast. CONTRAST:  131m OMNIPAQUE IOHEXOL 300 MG/ML  SOLN COMPARISON:  11/15/2015 FINDINGS: Lower chest: No acute abnormality. Hepatobiliary: No focal liver abnormality is seen. Status post cholecystectomy. Mild postoperative biliary dilatation. Pancreas: Unremarkable. No pancreatic ductal dilatation or surrounding inflammatory changes. Spleen: Normal in size without significant abnormality. Adrenals/Urinary Tract: Adrenal glands are unremarkable. Kidneys are normal, without renal calculi, solid lesion, or hydronephrosis. Bladder is unremarkable. Stomach/Bowel: The stomach and lower esophagus are fluid-filled. Appendix appears normal in a retrocecal lie. There is a moderate burden of stool throughout the colon, with a large stool ball in the distal sigmoid colon and adjacent extraluminal air (series 2, image 68). Vascular/Lymphatic: Aortic atherosclerosis. No enlarged abdominal or pelvic lymph nodes. Reproductive: Status post hysterectomy. Other: No abdominal wall hernia or abnormality. Small volume pneumoperitoneum and retroperitoneal emphysema. Trace fluid in the right lower quadrant. Musculoskeletal: No acute or significant osseous findings. IMPRESSION: 1. There is a moderate burden of stool throughout the colon, with a large stool ball in the distal sigmoid colon and adjacent extraluminal air throughout the mesentery and retroperitoneum. Findings are consistent with bowel perforation, likely secondary to stercoral ulceration. An obstipating mass of the distal sigmoid would be difficult to exclude and could be further investigated  by colonoscopy. 2. Small volume pneumoperitoneum and fluid in the right lower quadrant. These results were called by telephone at the time of interpretation on 12/21/2020 at 6:14 pm to [facility], who verbally acknowledged these results. Aortic Atherosclerosis (ICD10-I70.0). Electronically Signed   By: AEddie CandleM.D.   On: 12/21/2020 18:14  DG CHEST PORT 1 VIEW  Result Date: 12/23/2020 CLINICAL DATA:  Dyspnea. EXAM: PORTABLE CHEST 1 VIEW COMPARISON:  December 22, 2020. FINDINGS: Endotracheal tube tip approximately 13 mm above the carina. Right IJ central venous catheter tip projecting at the right atrium. Gastric tube courses below the diaphragm outside the field of view. Free air seen on prior radiograph and on the prior CT is less conspicuous on this exam. Similar streaky bibasilar opacities with low lung volumes. No new confluent consolidation. No visible pleural effusions or pneumothorax. Similar cardiomediastinal silhouette. Aortic atherosclerosis. Partially imaged ACDF. IMPRESSION: 1. Endotracheal tube tip approximately 13 mm above the carina. Retraction by approximately 2.5 cm would place the tip at the level of the clavicular heads. 2. Similar streaky bibasilar opacities, likely atelectasis. 3. Free air seen on prior radiograph and on the prior CT is less conspicuous on this exam. Electronically Signed   By: Margaretha Sheffield MD   On: 12/23/2020 07:50   DG Chest Port 1 View  Result Date: 12/22/2020 CLINICAL DATA:  Endotracheal tube placement. EXAM: PORTABLE CHEST 1 VIEW COMPARISON:  Chest radiograph 06/06/2020, included lung bases from abdominal CT earlier today. FINDINGS: Endotracheal tube tip 13 mm from the carina. Tip and side port of the enteric tube below the diaphragm in the stomach. Right central line tip in the right atrium. Heart is normal in size. Curvilinear lucency abutting the right heart border corresponds to free air under the hemidiaphragm. Bibasilar atelectasis, increased.  No pneumothorax. No significant pleural effusion. IMPRESSION: 1. Endotracheal tube tip 13 mm from the carina. Enteric tube tip and side port below the diaphragm in the stomach. 2. Right central line tip in the right atrium. 3. Increased bibasilar atelectasis. 4. Known free air in the upper abdomen characterized on CT. Electronically Signed   By: Keith Rake M.D.   On: 12/22/2020 00:11     CBC Recent Labs  Lab 12/21/20 1650 12/22/20 0339 12/23/20 0411 12/24/20 0437 12/25/20 0507 12/26/20 0439 12/27/20 0545  WBC 13.8* 10.2 12.3* 13.5* 16.7* 17.4* 17.3*  HGB 14.0 12.7 9.6* 9.1* 9.6* 9.3* 9.3*  HCT 43.3 38.6 29.4* 27.9* 29.5* 28.4* 28.6*  PLT 266 214 147* 153 199 231 281  MCV 88.0 88.7 89.1 89.4 88.1 87.4 89.1  MCH 28.5 29.2 29.1 29.2 28.7 28.6 29.0  MCHC 32.3 32.9 32.7 32.6 32.5 32.7 32.5  RDW 13.2 13.2 13.6 13.4 13.2 13.4 13.8  LYMPHSABS 0.9 1.0  --  1.7  --  3.7 3.3  MONOABS 0.7 0.5  --  0.8  --  1.9* 2.4*  EOSABS 0.1 0.0  --  1.0*  --  0.5 1.0*  BASOSABS 0.1 0.0  --  0.1  --  0.0 0.0    Chemistries  Recent Labs  Lab 12/21/20 1650 12/22/20 0339 12/23/20 0411 12/24/20 0437 12/25/20 0507 12/27/20 0545  NA 136 137 135 136 135 135  K 3.6 3.1* 2.8* 3.5 3.4* 3.6  CL 98 96* 96* 99 96* 99  CO2 '29 29 31 29 30 30  ' GLUCOSE 133* 148* 136* 110* 113* 137*  BUN '20 16 12 11 14 11  ' CREATININE 0.63 0.71 0.67 0.45 0.47 0.44  CALCIUM 9.0 8.1* 7.8* 8.0* 8.4* 8.2*  MG  --  1.4*  --   --   --   --   AST 93* 63* 32  --   --  18  ALT 86* 77* 45*  --   --  24  ALKPHOS 115 79 72  --   --  62  BILITOT 0.8 1.2 1.3*  --   --  0.6   ------------------------------------------------------------------------------------------------------------------ No results for input(s): CHOL, HDL, LDLCALC, TRIG, CHOLHDL, LDLDIRECT in the last 72 hours.  Lab Results  Component Value Date   HGBA1C 5.6 05/30/2020    ------------------------------------------------------------------------------------------------------------------ No results for input(s): TSH, T4TOTAL, T3FREE, THYROIDAB in the last 72 hours.  Invalid input(s): FREET3 ------------------------------------------------------------------------------------------------------------------ No results for input(s): VITAMINB12, FOLATE, FERRITIN, TIBC, IRON, RETICCTPCT in the last 72 hours.  Coagulation profile No results for input(s): INR, PROTIME in the last 168 hours.  No results for input(s): DDIMER in the last 72 hours.  Cardiac Enzymes No results for input(s): CKMB, TROPONINI, MYOGLOBIN in the last 168 hours.  Invalid input(s): CK ------------------------------------------------------------------------------------------------------------------ No results found for: BNP   Roxan Hockey M.D on 12/27/2020 at 12:39 PM  Go to [email or web address] - for contact info  [facility] - Office  [phone]

## 2020-12-27 NOTE — Care Management Important Message (Signed)
Important Message  Patient Details  Name: [name] MRN: [id number] Date of Birth: [birth year]   Medicare Important Message Given:  Yes     Tommy Medal 12/27/2020, 1:32 PM

## 2020-12-27 NOTE — Discharge Instructions (Signed)
Discharge Open Abdominal Surgery Instructions:  Common Complaints: Pain at the incision site is common. This will improve with time. Take your pain medications as described below. Some nausea is common and poor appetite. The main goal is to stay hydrated the first few days after surgery.   Diet/ Activity: Diet as tolerated. You have started and tolerated a diet in the hospital, and should continue to increase what you are able to eat.   You may not have a large appetite, but it is important to stay hydrated. Drink 64 ounces of water a day. Your appetite will return with time.  Packing your wound daily with saline dampened gauze and cover with abdominal pad and tape. Ostomy are per ostomy RN.  Finish antibiotics at home.  Rest and listen to your body, but do not remain in bed all day.  Walk everyday for at least 15-20 minutes. Deep cough and move around every 1-2 hours in the first few days after surgery.  Do not lift > 10 lbs, perform excessive bending, pushing, pulling, squatting for 6-8 weeks after surgery.   Pain Expectations and Narcotics: -After surgery you will have pain associated with your incisions and this is normal. The pain is muscular and nerve pain, and will get better with time. -You are encouraged and expected to take non narcotic medications like tylenol and ibuprofen (when able) to treat pain as multiple modalities can aid with pain treatment. -Narcotics are only used when pain is severe or there is breakthrough pain. -You are not expected to have a pain score of 0 after surgery, as we cannot prevent pain. A pain score of 3-4 that allows you to be functional, move, walk, and tolerate some activity is the goal. The pain will continue to improve over the days after surgery and is dependent on your surgery. -Due to [place] law, we are only able to give a certain amount of pain medication to treat post operative pain, and we only give additional narcotics on a patient by patient basis.   -For most laparoscopic surgery, studies have shown that the majority of patients only need 10-15 narcotic pills, and for open surgeries most patients only need 15-20.   -Having appropriate expectations of pain and knowledge of pain management with non narcotics is important as we do not want anyone to become addicted to narcotic pain medication.  -Using ice packs in the first 48 hours and heating pads after 48 hours, wearing an abdominal binder (when recommended), and using over the counter medications are all ways to help with pain management.   -Simple acts like meditation and mindfulness practices after surgery can also help with pain control and research has proven the benefit of these practices.  Medication: Take tylenol and ibuprofen as needed for pain control, alternating every 4-6 hours.  Example:  Tylenol 1000mg  @ 6am, 12noon, 6pm, 41midnight (Do not exceed 4000mg  of tylenol a day). Ibuprofen 800mg  @ 9am, 3pm, 9pm, 3am (Do not exceed 3600mg  of ibuprofen a day).  Take Roxicodone for breakthrough pain every 4 hours.  Take Colace for constipation related to narcotic pain medication. If you do not have a bowel movement in 2 days, take Miralax over the counter.  Drink plenty of water to also prevent constipation.   Contact Information: If you have questions or concerns, please call our office, [phone], Monday- Thursday 8AM-5PM and Friday 8AM-12Noon.  If it is after hours or on the weekend, please call [facility], [phone], [phone], and ask to speak  to the surgeon on call for Dr. Constance Haw at [facility].    Colostomy Home Guide, Adult  Colostomy surgery is done to create an opening in the front of the abdomen for stool (feces) to leave the body through an ostomy (stoma). Part of the large intestine is attached to the stoma. A bag, also called a pouch, is fitted over the stoma. Stool and gas will collect in the bag. After surgery, you will need to empty and change your  colostomy bag as needed. You will also need to care for your stoma. How to care for the stoma Your stoma should look pink, red, and moist, like the inside of your cheek. Soon after surgery, the stoma may be swollen, but this swelling will go away within 6 weeks. To care for the stoma:  Keep the skin around the stoma clean and dry.  Use a clean, soft washcloth to gently wash the stoma and the skin around it. Clean using a circular motion, and wipe away from the stoma opening, not toward it. ? Use warm water and only use cleansers recommended by your health care provider. ? Rinse the stoma area with plain water. ? Dry the area around the stoma well.  Use stoma powder or ointment on your skin only as told by your health care provider. Do not use any other powders, gels, wipes, or creams on the skin around the stoma.  Check the stoma area every day for signs of infection. Check for: ? New or worsening redness, swelling, or pain. ? New or increased fluid or blood. ? Pus or warmth.  Measure the stoma opening regularly and record the size. Watch for changes. (It is normal for the stoma to get smaller as swelling goes away.) Share this information with your health care provider. How to empty the colostomy bag Empty your bag at bedtime and whenever it is one-third to one-half full. Do not let the bag get more than half-full with stool or gas. The bag could leak if it gets too full. Some colostomy bags have a built-in gas release valve that releases gas often throughout the day. Follow these basic steps: 1. Wash your hands with soap and water. 2. Sit far back on the toilet seat. 3. Put several pieces of toilet paper into the toilet water. This will prevent splashing as you empty stool into the toilet. 4. Remove the clip or the hook-and-loop fastener from the tail end of the bag. 5. Unroll the tail, then empty the stool into the toilet. 6. Clean the tail with toilet paper or a moist  towelette. 7. Reroll the tail, and close it with the clip or the hook-and-loop fastener. 8. Wash your hands again.   How to change the colostomy bag Change your bag every 3-4 days or as often as told by your health care provider. Also change the bag if it is leaking or separating from the skin, or if your skin around the stoma looks or feels irritated. Irritated skin may be a sign that the bag is leaking. Always have colostomy supplies with you, and follow these basic steps: 1. Wash your hands with soap and water. Have paper towels or tissues nearby to clean any discharge. 2. Remove the old bag and skin barrier. Use your fingers or a warm cloth to gently push the skin away from the barrier. 3. Clean the stoma area with water or with mild soap and water, as directed. Use water to rinse away any soap. 4.  Dry the skin. You may use the cool setting on a hair dryer to do this. 5. Use a tracing pattern (template) to cut the skin barrier to the size needed. 6. If you are using a two-piece bag, attach the bag and the skin barrier to each other. Add the barrier ring, if you use one. 7. If directed, apply stoma powder or skin barrier gel to the skin. 8. Warm the skin barrier with your hands, or blow with a hair dryer for 5-10 seconds. 9. Remove the paper from the adhesive strip of the skin barrier. 10. Press the adhesive strip onto the skin around the stoma. 11. Gently rub the skin barrier onto the skin. This creates heat that helps the barrier to stick. 12. Apply stoma tape to the edges of the skin barrier, if desired. 17. Wash your hands again. General recommendations  Avoid wearing tight clothes or having anything press directly on your stoma or bag. Change your clothing whenever it is soiled or damp.  You may shower or bathe with the bag on or off. Do not use harsh or oily soaps or lotions. Dry the skin and bag after bathing.  Store all supplies in a cool, dry place. Do not leave supplies in  extreme heat because some parts can melt or not stick as well.  Whenever you leave home, take extra clothing and an extra skin barrier and bag with you.  If your bag gets wet, you can dry it with a hair dryer on the cool setting.  To prevent odor, you may put drops of ostomy deodorizer in the bag.  If recommended by your health care provider, put ostomy lubricant inside the bag. This helps stool to slide out of the bag more easily and completely. Contact a health care provider if:  You have new or worsening redness, swelling, or pain around your stoma.  You have new or increased fluid or blood coming from your stoma.  Your stoma feels warm to the touch.  You have pus coming from your stoma.  Your stoma extends in or out farther than normal.  You need to change your bag every day.  You have a fever. Get help right away if:  Your stool is bloody.  You have nausea or you vomit.  You have trouble breathing. Summary  Measure your stoma opening regularly and record the size. Watch for changes.  Empty your bag at bedtime and whenever it is one-third to one-half full. Do not let the bag get more than half-full with stool or gas.  Change your bag every 3-4 days or as often as told by your health care provider.  Whenever you leave home, take extra clothing and an extra skin barrier and bag with you. This information is not intended to replace advice given to you by your health care provider. Make sure you discuss any questions you have with your health care provider. Document Revised: 06/27/2020 Document Reviewed: 06/27/2020 Elsevier Patient Education  2021 [employer].

## 2020-12-27 NOTE — Progress Notes (Signed)
Physical Therapy Treatment Patient Details Name: [name] MRN: [id number] DOB: August 29, 1961 Today's Date: 12/27/2020    History of Present Illness [name] is a 60 y.o. female s/p Hartman's procedure with sigmoid resection and end colostomy on 12/21/20 with history of abdominal pain that was acute in onset, sharp in nature, and is now constant. It did not get any better during the day and actually got a lot worse.  She has had some associated nausea and vomiting. She reports having some constipation at times but mostly going everyday and having soft stools. She says she has no blood per rectum. She had a colonoscopy 2 years ago that was normal.    PT Comments    Patient showing improving mobility today requiring reduced assist compared to prior sessions as well as improving activity tolerance. Patient requires min assist to transition to seated EOB. She demonstrates good sitting tolreance and sitting balance today. She attempts to brush her hair while seated but is limited by pain. Patient transfers to standing with min assist and use of RW and is minimally unsteady upon standing. Patient ambulates with RW with intermittent unsteadiness and veering without loss of balance. She ambulates increased distance today. She is overall limited by impaired activity tolerance, fatigue, and pain. Patient will benefit from continued physical therapy in hospital and recommended venue below to increase strength, balance, endurance for safe ADLs and gait.    Follow Up Recommendations  Home health PT;Supervision for mobility/OOB;Supervision - Intermittent     Equipment Recommendations  Rolling walker with 5" wheels    Recommendations for Other Services       Precautions / Restrictions Precautions Precautions: Fall Restrictions Weight Bearing Restrictions: No    Mobility  Bed Mobility Overal bed mobility: Needs Assistance Bed Mobility: Sidelying to Sit;Supine to Sit   Sidelying to sit:  Min assist;HOB elevated Supine to sit: Min assist     General bed mobility comments: slow, labored, c/o increasing pain with movement, assist to pull to upright trunk and pull to seated    Transfers   Equipment used: Rolling walker (2 wheeled) Transfers: Sit to/from [facility] Sit to Stand: Min assist Stand pivot transfers: Min assist       General transfer comment: increased time, labored movement with RW  Ambulation/Gait Ambulation/Gait assistance: Min assist;Min guard Gait Distance (Feet): 100 Feet Assistive device: Rolling walker (2 wheeled) Gait Pattern/deviations: Decreased step length - right;Decreased step length - left;Decreased stride length Gait velocity: decreased   General Gait Details: slow, labored cadence with RW, intermittent unsteadiness and veering, no loss of balance   Stairs             Wheelchair Mobility    Modified Rankin (Stroke Patients Only)       Balance Overall balance assessment: Needs assistance Sitting-balance support: Feet supported;No upper extremity supported Sitting balance-Leahy Scale: Good Sitting balance - Comments: good seated at EOB   Standing balance support: During functional activity;No upper extremity supported Standing balance-Leahy Scale: Fair Standing balance comment: using RW                            Cognition Arousal/Alertness: Awake/alert Behavior During Therapy: WFL for tasks assessed/performed Overall Cognitive Status: Within Functional Limits for tasks assessed  Exercises      General Comments        Pertinent Vitals/Pain Pain Assessment: Faces Faces Pain Scale: Hurts even more Pain Location: abdomen at surgery site with movement Pain Descriptors / Indicators: Discomfort;Moaning;Guarding;Grimacing;Sore Pain Intervention(s): Limited activity within patient's tolerance;Monitored during session;Patient  requesting pain meds-RN notified    Home Living                      Prior Function            PT Goals (current goals can now be found in the care plan section) Acute Rehab PT Goals Patient Stated Goal: return home with family to assist PT Goal Formulation: With patient/family Time For Goal Achievement: 12/31/20 Potential to Achieve Goals: Good Progress towards PT goals: Progressing toward goals    Frequency    Min 3X/week      PT Plan Current plan remains appropriate    Co-evaluation              AM-PAC PT "6 Clicks" Mobility   Outcome Measure  Help needed turning from your back to your side while in a flat bed without using bedrails?: A Lot Help needed moving from lying on your back to sitting on the side of a flat bed without using bedrails?: A Little Help needed moving to and from a bed to a chair (including a wheelchair)?: A Little Help needed standing up from a chair using your arms (e.g., wheelchair or bedside chair)?: A Little Help needed to walk in hospital room?: A Little Help needed climbing 3-5 steps with a railing? : A Lot 6 Click Score: 16    End of Session   Activity Tolerance: Patient tolerated treatment well;Patient limited by fatigue;Patient limited by pain Patient left: with call bell/phone within reach;in bed Nurse Communication: Mobility status;Patient requests pain meds PT Visit Diagnosis: Unsteadiness on feet (R26.81);Other abnormalities of gait and mobility (R26.89);Muscle weakness (generalized) (M62.81)     Time: 0177-9390 PT Time Calculation (min) (ACUTE ONLY): 24 min  Charges:  $Therapeutic Activity: 23-37 mins                     12:27 PM, 12/27/20 Mearl Latin PT, DPT Physical Therapist at [facility]

## 2020-12-28 LAB — RENAL FUNCTION PANEL
Albumin: 2.3 g/dL — ABNORMAL LOW (ref 3.5–5.0)
Anion gap: 10 (ref 5–15)
BUN: 14 mg/dL (ref 8–23)
CO2: 27 mmol/L (ref 22–32)
Calcium: 8.5 mg/dL — ABNORMAL LOW (ref 8.9–10.3)
Chloride: 101 mmol/L (ref 98–111)
Creatinine, Ser: 0.59 mg/dL (ref 0.44–1.00)
GFR, Estimated: >60 mL/min (ref >60)
Glucose, Bld: 123 mg/dL — ABNORMAL HIGH (ref 70–99)
Phosphorus: 3.1 mg/dL (ref 2.5–4.6)
Potassium: 3.8 mmol/L (ref 3.5–5.1)
Sodium: 138 mmol/L (ref 135–145)

## 2020-12-28 LAB — CBC
HCT: 30.7 % — ABNORMAL LOW (ref 36.0–46.0)
Hemoglobin: 9.7 g/dL — ABNORMAL LOW (ref 12.0–15.0)
MCH: 28.9 pg (ref 26.0–34.0)
MCHC: 31.6 g/dL (ref 30.0–36.0)
MCV: 91.4 fL (ref 80.0–100.0)
Platelets: 309 10*3/uL (ref 150–400)
RBC: 3.36 MIL/uL — ABNORMAL LOW (ref 3.87–5.11)
RDW: 14.4 % (ref 11.5–15.5)
WBC: 14.5 10*3/uL — ABNORMAL HIGH (ref 4.0–10.5)
nRBC: 0.3 % — ABNORMAL HIGH (ref 0.0–0.2)

## 2020-12-28 MED ORDER — OXYCODONE HCL 10 MG PO TABS: 10.0000 mg | ORAL_TABLET | Freq: Two times a day (BID) | ORAL | 0 refills | Status: DC

## 2020-12-28 NOTE — Discharge Summary (Signed)
Physician Discharge Summary  Patient ID: [name] MRN: [id number] DOB/AGE: December 15, 1960 60 y.o.  Admit date: 12/21/2020 Discharge date: 12/28/20  Admission Diagnoses: bowel perforation  Discharge Diagnoses:  Stercoral ulcer of sigmoid colon, feculant peritonitis  Discharged Condition: good  Hospital Course: admitted for above, underwent emergent hartman's.  Please see op note for details.  Post op, had some leukocytosis, but improved clinically, with abx.  Repeat CT scan confirmed no drainable abscess.  Midline incision skin left open and wet to dry dressings performed.  At time of discharge, tolerating regular diet, pain controlled, productive ostomy. PT and HH for woundcare and ostomy arranged.  Will finish abx course on oral augmentin.  Consults: hospitalits for co-management  Discharge Exam: Blood pressure 130/71, pulse 91, temperature 98.6 F (37 C), temperature source Oral, resp. rate 20, height 5\' 4"  (1.626 m), weight 89.6 kg, SpO2 94 %. General appearance: alert, cooperative and no distress GI: soft, non-tender; bowel sounds normal; no masses,  no organomegaly and ostomy productive of stool.  midline wound with pink granulation tissue, healthy down to visible base. No evidence of dehiscence.   Disposition:  Discharge disposition: 01-Home or Self Care       Discharge Instructions    Call MD for:  difficulty breathing, headache or visual disturbances   Complete by: As directed    Call MD for:  extreme fatigue   Complete by: As directed    Call MD for:  persistant dizziness or light-headedness   Complete by: As directed    Call MD for:  persistant nausea and vomiting   Complete by: As directed    Call MD for:  redness, tenderness, or signs of infection (pain, swelling, redness, odor or green/yellow discharge around incision site)   Complete by: As directed    Call MD for:  severe uncontrolled pain   Complete by: As directed    Call MD for:  temperature >100.4    Complete by: As directed    Increase activity slowly   Complete by: As directed    Walker rolling   Complete by: As directed      Allergies as of 12/28/2020      Reactions   Nsaids Nausea Only   Abdominal pain      Medication List    STOP taking these medications   amoxicillin 500 MG capsule Commonly known as: AMOXIL     TAKE these medications   amitriptyline 25 MG tablet Commonly known as: ELAVIL Take 25 mg by mouth at bedtime.   amoxicillin-clavulanate 875-125 MG tablet Commonly known as: AUGMENTIN Take 1 tablet by mouth every 12 (twelve) hours for 4 days.   atenolol 50 MG tablet Commonly known as: TENORMIN Take 1 tablet (50 mg total) by mouth daily.   busPIRone 15 MG tablet Commonly known as: BUSPAR Take 1 tablet (15 mg total) by mouth 3 (three) times daily.   citalopram 40 MG tablet Commonly known as: CELEXA Take 1 tablet (40 mg total) by mouth daily with breakfast.   famotidine 20 MG tablet Commonly known as: PEPCID Take 1 tablet (20 mg total) by mouth daily as needed for heartburn or indigestion.   hydrALAZINE 50 MG tablet Commonly known as: APRESOLINE Take 1 tablet (50 mg total) by mouth 2 (two) times daily as needed.   latanoprost 0.005 % ophthalmic solution Commonly known as: XALATAN Place 1 drop into both eyes daily.   lisinopril-hydrochlorothiazide 20-25 MG tablet Commonly known as: ZESTORETIC TAKE 1 TABLET BY MOUTH EVERY  DAY   nitroGLYCERIN 0.4 MG SL tablet Commonly known as: NITROSTAT Place 1 tablet (0.4 mg total) under the tongue every 5 (five) minutes as needed for chest pain.   ondansetron 4 MG tablet Commonly known as: ZOFRAN Take 4 mg by mouth every 8 (eight) hours as needed. for nausea   Oxycodone HCl 10 MG Tabs Take 5 mg by mouth 2 (two) times daily. What changed: Another medication with the same name was added. Make sure you understand how and when to take each.   oxyCODONE 5 MG immediate release tablet Commonly known as: Oxy  IR/ROXICODONE Take 1 tablet (5 mg total) by mouth every 4 (four) hours as needed for severe pain or breakthrough pain. What changed: You were already taking a medication with the same name, and this prescription was added. Make sure you understand how and when to take each.   rosuvastatin 20 MG tablet Commonly known as: Crestor Take 1 tablet (20 mg total) by mouth daily.   tiZANidine 4 MG tablet Commonly known as: ZANAFLEX Take 4 mg by mouth 3 (three) times daily.   traZODone 100 MG tablet Commonly known as: DESYREL Take 1.5 tablets (150 mg total) by mouth at bedtime as needed for sleep.       Follow-up Information    Virl Cagey, MD Follow up on 01/02/2021.   Specialty: General Surgery Why: wound check and ostomy check  Contact information: [address] [place] [facility] [address] [phone]                Total time spent arranging discharge was <26min. Signed: Benjamine Sprague 12/28/2020, 9:04 AM

## 2020-12-29 DIAGNOSIS — I119 Hypertensive heart disease without heart failure: Secondary | ICD-10-CM | POA: Diagnosis not present

## 2020-12-29 DIAGNOSIS — K658 Other peritonitis: Secondary | ICD-10-CM | POA: Diagnosis not present

## 2020-12-29 DIAGNOSIS — Z4801 Encounter for change or removal of surgical wound dressing: Secondary | ICD-10-CM | POA: Diagnosis not present

## 2020-12-29 DIAGNOSIS — Z48815 Encounter for surgical aftercare following surgery on the digestive system: Secondary | ICD-10-CM | POA: Diagnosis not present

## 2020-12-29 DIAGNOSIS — M5030 Other cervical disc degeneration, unspecified cervical region: Secondary | ICD-10-CM | POA: Diagnosis not present

## 2020-12-29 DIAGNOSIS — R69 Illness, unspecified: Secondary | ICD-10-CM | POA: Diagnosis not present

## 2020-12-29 DIAGNOSIS — J690 Pneumonitis due to inhalation of food and vomit: Secondary | ICD-10-CM | POA: Diagnosis not present

## 2020-12-29 DIAGNOSIS — D689 Coagulation defect, unspecified: Secondary | ICD-10-CM | POA: Diagnosis not present

## 2020-12-29 DIAGNOSIS — Z433 Encounter for attention to colostomy: Secondary | ICD-10-CM | POA: Diagnosis not present

## 2020-12-31 ENCOUNTER — Encounter (HOSPITAL_COMMUNITY): Payer: Self-pay | Admitting: Emergency Medicine

## 2020-12-31 ENCOUNTER — Inpatient Hospital Stay (HOSPITAL_COMMUNITY)
Admission: EM | Admit: 2020-12-31 | Discharge: 2021-01-02 | DRG: 862 | Disposition: A | Discharge status: Home Health | Payer: Medicare HMO | Attending: General Surgery | Admitting: General Surgery

## 2020-12-31 ENCOUNTER — Emergency Department (HOSPITAL_COMMUNITY): Payer: Medicare HMO

## 2020-12-31 ENCOUNTER — Other Ambulatory Visit: Payer: Self-pay

## 2020-12-31 ENCOUNTER — Ambulatory Visit: Payer: Medicare HMO | Admitting: Cardiology

## 2020-12-31 DIAGNOSIS — G4730 Sleep apnea, unspecified: Secondary | ICD-10-CM | POA: Diagnosis present

## 2020-12-31 DIAGNOSIS — I10 Essential (primary) hypertension: Secondary | ICD-10-CM | POA: Diagnosis not present

## 2020-12-31 DIAGNOSIS — Z9049 Acquired absence of other specified parts of digestive tract: Secondary | ICD-10-CM

## 2020-12-31 DIAGNOSIS — Z90710 Acquired absence of both cervix and uterus: Secondary | ICD-10-CM | POA: Diagnosis not present

## 2020-12-31 DIAGNOSIS — T8143XA Infection following a procedure, organ and space surgical site, initial encounter: Secondary | ICD-10-CM

## 2020-12-31 DIAGNOSIS — R569 Unspecified convulsions: Secondary | ICD-10-CM | POA: Diagnosis present

## 2020-12-31 DIAGNOSIS — F419 Anxiety disorder, unspecified: Secondary | ICD-10-CM | POA: Diagnosis present

## 2020-12-31 DIAGNOSIS — K219 Gastro-esophageal reflux disease without esophagitis: Secondary | ICD-10-CM | POA: Diagnosis present

## 2020-12-31 DIAGNOSIS — K631 Perforation of intestine (nontraumatic): Secondary | ICD-10-CM | POA: Diagnosis not present

## 2020-12-31 DIAGNOSIS — D75839 Thrombocytosis, unspecified: Secondary | ICD-10-CM | POA: Diagnosis not present

## 2020-12-31 DIAGNOSIS — J90 Pleural effusion, not elsewhere classified: Secondary | ICD-10-CM | POA: Diagnosis not present

## 2020-12-31 DIAGNOSIS — R1084 Generalized abdominal pain: Secondary | ICD-10-CM | POA: Diagnosis not present

## 2020-12-31 DIAGNOSIS — D649 Anemia, unspecified: Secondary | ICD-10-CM | POA: Diagnosis present

## 2020-12-31 DIAGNOSIS — E785 Hyperlipidemia, unspecified: Secondary | ICD-10-CM | POA: Diagnosis present

## 2020-12-31 DIAGNOSIS — M810 Age-related osteoporosis without current pathological fracture: Secondary | ICD-10-CM | POA: Diagnosis present

## 2020-12-31 DIAGNOSIS — F410 Panic disorder [episodic paroxysmal anxiety] without agoraphobia: Secondary | ICD-10-CM | POA: Diagnosis present

## 2020-12-31 DIAGNOSIS — F32A Depression, unspecified: Secondary | ICD-10-CM | POA: Diagnosis present

## 2020-12-31 DIAGNOSIS — Z8249 Family history of ischemic heart disease and other diseases of the circulatory system: Secondary | ICD-10-CM

## 2020-12-31 DIAGNOSIS — Z886 Allergy status to analgesic agent status: Secondary | ICD-10-CM

## 2020-12-31 DIAGNOSIS — Z981 Arthrodesis status: Secondary | ICD-10-CM | POA: Diagnosis not present

## 2020-12-31 DIAGNOSIS — R69 Illness, unspecified: Secondary | ICD-10-CM | POA: Diagnosis not present

## 2020-12-31 DIAGNOSIS — Z20822 Contact with and (suspected) exposure to covid-19: Secondary | ICD-10-CM | POA: Diagnosis present

## 2020-12-31 DIAGNOSIS — Y838 Other surgical procedures as the cause of abnormal reaction of the patient, or of later complication, without mention of misadventure at the time of the procedure: Secondary | ICD-10-CM | POA: Diagnosis present

## 2020-12-31 DIAGNOSIS — M5030 Other cervical disc degeneration, unspecified cervical region: Secondary | ICD-10-CM | POA: Diagnosis present

## 2020-12-31 DIAGNOSIS — Z933 Colostomy status: Secondary | ICD-10-CM | POA: Diagnosis not present

## 2020-12-31 DIAGNOSIS — K651 Peritoneal abscess: Secondary | ICD-10-CM | POA: Diagnosis not present

## 2020-12-31 DIAGNOSIS — Z79899 Other long term (current) drug therapy: Secondary | ICD-10-CM

## 2020-12-31 DIAGNOSIS — J9811 Atelectasis: Secondary | ICD-10-CM | POA: Diagnosis not present

## 2020-12-31 DIAGNOSIS — K5900 Constipation, unspecified: Secondary | ICD-10-CM | POA: Diagnosis present

## 2020-12-31 DIAGNOSIS — M1990 Unspecified osteoarthritis, unspecified site: Secondary | ICD-10-CM | POA: Diagnosis present

## 2020-12-31 DIAGNOSIS — Z87891 Personal history of nicotine dependence: Secondary | ICD-10-CM | POA: Diagnosis not present

## 2020-12-31 DIAGNOSIS — Z743 Need for continuous supervision: Secondary | ICD-10-CM | POA: Diagnosis not present

## 2020-12-31 DIAGNOSIS — R0902 Hypoxemia: Secondary | ICD-10-CM | POA: Diagnosis not present

## 2020-12-31 DIAGNOSIS — G8929 Other chronic pain: Secondary | ICD-10-CM | POA: Diagnosis present

## 2020-12-31 DIAGNOSIS — L02211 Cutaneous abscess of abdominal wall: Secondary | ICD-10-CM | POA: Diagnosis not present

## 2020-12-31 DIAGNOSIS — R109 Unspecified abdominal pain: Secondary | ICD-10-CM | POA: Diagnosis not present

## 2020-12-31 HISTORY — DX: Infection following a procedure, organ and space surgical site, initial encounter: T81.43XA

## 2020-12-31 LAB — CBC
HCT: 32.3 % — ABNORMAL LOW (ref 36.0–46.0)
Hemoglobin: 10.6 g/dL — ABNORMAL LOW (ref 12.0–15.0)
MCH: 29.1 pg (ref 26.0–34.0)
MCHC: 32.8 g/dL (ref 30.0–36.0)
MCV: 88.7 fL (ref 80.0–100.0)
Platelets: 444 10*3/uL — ABNORMAL HIGH (ref 150–400)
RBC: 3.64 MIL/uL — ABNORMAL LOW (ref 3.87–5.11)
RDW: 14.6 % (ref 11.5–15.5)
WBC: 18 10*3/uL — ABNORMAL HIGH (ref 4.0–10.5)
nRBC: 0.2 % (ref 0.0–0.2)

## 2020-12-31 LAB — URINALYSIS, ROUTINE W REFLEX MICROSCOPIC
Bilirubin Urine: NEGATIVE
Glucose, UA: NEGATIVE mg/dL
Hgb urine dipstick: NEGATIVE
Ketones, ur: NEGATIVE mg/dL
Leukocytes,Ua: NEGATIVE
Nitrite: NEGATIVE
Protein, ur: NEGATIVE mg/dL
Specific Gravity, Urine: 1.034 — ABNORMAL HIGH (ref 1.005–1.030)
pH: 8 (ref 5.0–8.0)

## 2020-12-31 LAB — COMPREHENSIVE METABOLIC PANEL
ALT: 16 U/L (ref 0–44)
AST: 15 U/L (ref 15–41)
Albumin: 2.4 g/dL — ABNORMAL LOW (ref 3.5–5.0)
Alkaline Phosphatase: 74 U/L (ref 38–126)
Anion gap: 9 (ref 5–15)
BUN: 5 mg/dL — ABNORMAL LOW (ref 8–23)
CO2: 31 mmol/L (ref 22–32)
Calcium: 8.7 mg/dL — ABNORMAL LOW (ref 8.9–10.3)
Chloride: 95 mmol/L — ABNORMAL LOW (ref 98–111)
Creatinine, Ser: 0.67 mg/dL (ref 0.44–1.00)
GFR, Estimated: >60 mL/min (ref >60)
Glucose, Bld: 129 mg/dL — ABNORMAL HIGH (ref 70–99)
Potassium: 3.5 mmol/L (ref 3.5–5.1)
Sodium: 135 mmol/L (ref 135–145)
Total Bilirubin: 0.7 mg/dL (ref 0.3–1.2)
Total Protein: 6.5 g/dL (ref 6.5–8.1)

## 2020-12-31 LAB — PROTIME-INR
INR: 1.2 (ref 0.8–1.2)
Prothrombin Time: 14.8 seconds (ref 11.4–15.2)

## 2020-12-31 LAB — SARS CORONAVIRUS 2 (TAT 6-24 HRS): SARS Coronavirus 2: NEGATIVE

## 2020-12-31 LAB — LIPASE, BLOOD: Lipase: 28 U/L (ref 11–51)

## 2020-12-31 MED ORDER — LISINOPRIL 10 MG PO TABS
20.0000 mg | ORAL_TABLET | Freq: Every day | ORAL | Status: DC
  Administered 2021-01-01 – 2021-01-02 (×2): 20 mg via ORAL
  Filled 2020-12-31 (×2): qty 2

## 2020-12-31 MED ORDER — NITROGLYCERIN 0.4 MG SL SUBL: 0.4000 mg | SUBLINGUAL_TABLET | SUBLINGUAL | Status: DC | PRN

## 2020-12-31 MED ORDER — ACETAMINOPHEN 500 MG PO TABS
1000.0000 mg | ORAL_TABLET | Freq: Four times a day (QID) | ORAL | Status: DC
  Administered 2020-12-31 – 2021-01-01 (×5): 1000 mg via ORAL
  Filled 2020-12-31 (×6): qty 2

## 2020-12-31 MED ORDER — DIPHENHYDRAMINE HCL 12.5 MG/5ML PO ELIX: 12.5000 mg | ORAL_SOLUTION | Freq: Four times a day (QID) | ORAL | Status: DC | PRN

## 2020-12-31 MED ORDER — MORPHINE SULFATE (PF) 4 MG/ML IV SOLN
4.0000 mg | Freq: Once | INTRAVENOUS | Status: AC
Start: 2020-12-31 — End: 2020-12-31
  Administered 2020-12-31: 4 mg via INTRAVENOUS
  Filled 2020-12-31: qty 1

## 2020-12-31 MED ORDER — TIZANIDINE HCL 4 MG PO TABS
4.0000 mg | ORAL_TABLET | Freq: Three times a day (TID) | ORAL | Status: DC
  Administered 2020-12-31 – 2021-01-02 (×6): 4 mg via ORAL
  Filled 2020-12-31 (×6): qty 1

## 2020-12-31 MED ORDER — LATANOPROST 0.005 % OP SOLN
1.0000 [drp] | Freq: Every day | OPHTHALMIC | Status: DC
  Administered 2020-12-31 – 2021-01-02 (×3): 1 [drp] via OPHTHALMIC
  Filled 2020-12-31 (×2): qty 2.5

## 2020-12-31 MED ORDER — ONDANSETRON HCL 4 MG/2ML IJ SOLN
4.0000 mg | Freq: Once | INTRAMUSCULAR | Status: AC
  Administered 2020-12-31: 4 mg via INTRAVENOUS
  Filled 2020-12-31: qty 2

## 2020-12-31 MED ORDER — DIPHENHYDRAMINE HCL 50 MG/ML IJ SOLN: 12.5000 mg | Freq: Four times a day (QID) | INTRAMUSCULAR | Status: DC | PRN

## 2020-12-31 MED ORDER — BUSPIRONE HCL 5 MG PO TABS
15.0000 mg | ORAL_TABLET | Freq: Three times a day (TID) | ORAL | Status: DC
  Administered 2020-12-31 – 2021-01-02 (×6): 15 mg via ORAL
  Filled 2020-12-31 (×6): qty 3

## 2020-12-31 MED ORDER — FAMOTIDINE 20 MG PO TABS: 20.0000 mg | ORAL_TABLET | Freq: Every day | ORAL | Status: DC | PRN

## 2020-12-31 MED ORDER — SIMETHICONE 80 MG PO CHEW: 40.0000 mg | CHEWABLE_TABLET | Freq: Four times a day (QID) | ORAL | Status: DC | PRN

## 2020-12-31 MED ORDER — DOCUSATE SODIUM 100 MG PO CAPS
100.0000 mg | ORAL_CAPSULE | Freq: Two times a day (BID) | ORAL | Status: DC
  Administered 2020-12-31 – 2021-01-02 (×5): 100 mg via ORAL
  Filled 2020-12-31 (×5): qty 1

## 2020-12-31 MED ORDER — PIPERACILLIN-TAZOBACTAM 3.375 G IVPB
3.3750 g | Freq: Three times a day (TID) | INTRAVENOUS | Status: DC
  Administered 2020-12-31 – 2021-01-02 (×7): 3.375 g via INTRAVENOUS
  Filled 2020-12-31 (×7): qty 50

## 2020-12-31 MED ORDER — ENOXAPARIN SODIUM 40 MG/0.4ML ~~LOC~~ SOLN
40.0000 mg | SUBCUTANEOUS | Status: DC
  Administered 2020-12-31: 40 mg via SUBCUTANEOUS
  Filled 2020-12-31: qty 0.4

## 2020-12-31 MED ORDER — POLYETHYLENE GLYCOL 3350 17 G PO PACK
17.0000 g | PACK | Freq: Every day | ORAL | Status: DC
  Administered 2020-12-31 – 2021-01-02 (×3): 17 g via ORAL
  Filled 2020-12-31 (×3): qty 1

## 2020-12-31 MED ORDER — ATENOLOL 25 MG PO TABS
50.0000 mg | ORAL_TABLET | Freq: Every day | ORAL | Status: DC
  Administered 2020-12-31 – 2021-01-02 (×3): 50 mg via ORAL
  Filled 2020-12-31 (×3): qty 2

## 2020-12-31 MED ORDER — MORPHINE SULFATE (PF) 4 MG/ML IV SOLN
4.0000 mg | Freq: Once | INTRAVENOUS | Status: AC
  Administered 2020-12-31: 4 mg via INTRAVENOUS
  Filled 2020-12-31: qty 1

## 2020-12-31 MED ORDER — AMITRIPTYLINE HCL 25 MG PO TABS
25.0000 mg | ORAL_TABLET | Freq: Every day | ORAL | Status: DC
Start: 2020-12-31 — End: 2021-01-02
  Administered 2020-12-31 – 2021-01-01 (×2): 25 mg via ORAL
  Filled 2020-12-31 (×2): qty 1

## 2020-12-31 MED ORDER — OXYCODONE HCL 5 MG PO TABS
5.0000 mg | ORAL_TABLET | ORAL | Status: DC | PRN
  Administered 2020-12-31 – 2021-01-02 (×3): 10 mg via ORAL
  Administered 2021-01-02: 5 mg via ORAL
  Filled 2020-12-31: qty 1
  Filled 2020-12-31 (×3): qty 2

## 2020-12-31 MED ORDER — HYDROCHLOROTHIAZIDE 25 MG PO TABS
25.0000 mg | ORAL_TABLET | Freq: Every day | ORAL | Status: DC
  Administered 2020-12-31 – 2021-01-02 (×3): 25 mg via ORAL
  Filled 2020-12-31 (×3): qty 1

## 2020-12-31 MED ORDER — ONDANSETRON 4 MG PO TBDP: 4.0000 mg | ORAL_TABLET | Freq: Four times a day (QID) | ORAL | Status: DC | PRN

## 2020-12-31 MED ORDER — METOPROLOL TARTRATE 5 MG/5ML IV SOLN: 5.0000 mg | Freq: Four times a day (QID) | INTRAVENOUS | Status: DC | PRN

## 2020-12-31 MED ORDER — CITALOPRAM HYDROBROMIDE 20 MG PO TABS
40.0000 mg | ORAL_TABLET | Freq: Every day | ORAL | Status: DC
  Administered 2020-12-31 – 2021-01-02 (×3): 40 mg via ORAL
  Filled 2020-12-31 (×3): qty 2

## 2020-12-31 MED ORDER — ONDANSETRON HCL 4 MG/2ML IJ SOLN
4.0000 mg | Freq: Four times a day (QID) | INTRAMUSCULAR | Status: DC | PRN
  Administered 2020-12-31 – 2021-01-01 (×2): 4 mg via INTRAVENOUS
  Filled 2020-12-31 (×2): qty 2

## 2020-12-31 MED ORDER — LISINOPRIL-HYDROCHLOROTHIAZIDE 20-25 MG PO TABS: 1.0000 | ORAL_TABLET | Freq: Every day | ORAL | Status: DC

## 2020-12-31 MED ORDER — TRAZODONE HCL 50 MG PO TABS
150.0000 mg | ORAL_TABLET | Freq: Every evening | ORAL | Status: DC | PRN
  Administered 2020-12-31 – 2021-01-01 (×2): 150 mg via ORAL
  Filled 2020-12-31 (×2): qty 3

## 2020-12-31 MED ORDER — IOHEXOL 300 MG/ML  SOLN
100.0000 mL | Freq: Once | INTRAMUSCULAR | Status: AC | PRN
  Administered 2020-12-31: 100 mL via INTRAVENOUS

## 2020-12-31 MED ORDER — HYDROMORPHONE HCL 1 MG/ML IJ SOLN
0.5000 mg | INTRAMUSCULAR | Status: DC | PRN
  Administered 2020-12-31 – 2021-01-01 (×5): 0.5 mg via INTRAVENOUS
  Filled 2020-12-31 (×5): qty 0.5

## 2020-12-31 MED ORDER — LACTATED RINGERS IV BOLUS
1000.0000 mL | Freq: Once | INTRAVENOUS | Status: AC
  Administered 2020-12-31: 1000 mL via INTRAVENOUS

## 2020-12-31 NOTE — ED Triage Notes (Signed)
Pt brought in by EMS for severe abdominal pain. Pt had surgery a week ago for Bowel Obstruction and has colostomy on L. Pt states she went to bed at 8pm last night and woke up this morning with abdominal pain (more severe on Right) and noticed she had not had any output in her colostomy bag.

## 2020-12-31 NOTE — Progress Notes (Signed)
   Pt with Hx colonic perforation Colostomy in OR 12/21/20  Back to ED this am with abd pain; leukocytosis CT revealing:  IMPRESSION: 1. Organizing mesenteric fluid in the left abdomen associated with some peritoneal thickening, suspicious for developing abscess. The most confluent such fluid is in the left lower quadrant measures 32 x 44 mm (estimated at 30 mL). See coronal image 48. 2. Otherwise stable/satisfactory postoperative appearance of the abdomen. Descending colostomy with no evidence of bowel obstruction. Although there is retained fluid and/or liquid stool in the proximal colon. Trace residual retroperitoneal gas. 3. Small pleural effusions and lower lobe atelectasis not significantly changed. Regressed anasarca since the CT on 12/26/2020.  Request made per Dr Hayes Ludwig- for abscess drain placement  Dr Pascal Lux has reviewed imaging and approves procedure Will plan for 2/23 in Rad at [facility]  I am placing orders now

## 2020-12-31 NOTE — H&P (Signed)
[facility] History and Physical   Chief Complaint    Abdominal Pain      [name] is a 60 y.o. female.  HPI:  Patient is s/p end colostomy for perforated stercoral ulcer. She is doing fair but had worsening pain and cramps in the lower abdomen and some low grade fevers. She had a persistent leukocytosis post op and did go home on a oral antibiotic. Her leukocytosis is higher now and she has a fluid collection in the lower abdomen that is likely infected.  She says she has had some nausea but is eating. She had output from her ostomy and says her wound has been changed daily as prescribed.   Past Medical History:  Diagnosis Date  . Abdominal pain   . Anxiety   . Arthritis   . Blood in stool   . Breast changes, fibrocystic   . Bruises easily   . Cardiomegaly 2018  . Clotting disorder (HCC)     itp , none since 1976, no current hematologist  . Constipation   . DDD (degenerative disc disease), cervical    with lumbar issues  . Depression   . Generalized headaches   . GERD (gastroesophageal reflux disease)   . Hemorrhoid   . Hyperlipidemia   . Hypertension   . Idiopathic thrombocytopenia ([place])    as child  . Leg swelling    both ankles  . Liver lesion   . Nasal congestion   . Nausea & vomiting   . Neuromuscular disorder ([place])    back  injury  . Neuromuscular disorder ([place])    3 neck surgeries,neck fusion.pin,plates,screws  . Osteoporosis   . Panic attacks    pt also  has germaphobia  . Rectal bleeding   . Seizures ([place]) 1998   stress induced, one time, none since, no seizure meds  . Seizures ([place])    Stated she had 2 weeks ago,it was like a transient,stare.  . Sleep apnea    STOPBANG=4  . Spleen enlarged 1976, none since   r/t idiopathic thrombocytopenia  . Trouble swallowing   . Unintentional weight loss   . Weakness    weakness varies    Past Surgical History:  Procedure Laterality Date  . ABDOMINAL HYSTERECTOMY  1997  . BACK  SURGERY    . CERVICAL SPINE SURGERY  2005, 2007, 2010   x3, fusion done,plates and screws present  . [place]  . CHOLECYSTECTOMY  03/14/2012   Procedure: LAPAROSCOPIC CHOLECYSTECTOMY;  Surgeon: Stark Klein, MD;  Location: [facility];  Service: General;  Laterality: N/A;  . COLECTOMY WITH COLOSTOMY CREATION/HARTMANN PROCEDURE N/A 12/21/2020   Procedure: COLECTOMY WITH COLOSTOMY CREATION/HARTMANN PROCEDURE;  Surgeon: Virl Cagey, MD;  Location: [facility];  Service: General;  Laterality: N/A;  . colonscopy and esophagogastrodudononescopy  02-04-12  . LUMBAR DISC SURGERY  11/2014    Family History  Problem Relation Age of Onset  . Hypertension Mother   . Heart disease Mother   . Lung cancer Father   . Cancer Father        lung  . Colon cancer Paternal Grandmother   . Cancer Paternal Grandmother        colon  . Heart disease Maternal Grandmother   . Diabetes Sister   . Bipolar disorder Sister   . Heart disease Sister        Congestive Heart Failure  . Hypertension Sister     Social History   Tobacco  Use  . Smoking status: Former Smoker    Packs/day: 1.00    Years: 12.00    Pack years: 12.00    Types: Cigarettes    Start date: 12/20/2005    Quit date: 09/20/2020    Years since quitting: 0.2  . Smokeless tobacco: Never Used  Vaping Use  . Vaping Use: Former  . Quit date: 09/07/2016  . Devices: only used for about a month  Substance Use Topics  . Alcohol use: No    Alcohol/week: 0.0 standard drinks  . Drug use: Yes    Types: Marijuana    Medications:  I have reviewed the patient's current medications. Prior to Admission: (Not in a hospital admission)  Scheduled: . acetaminophen  1,000 mg Oral Q6H  . amitriptyline  25 mg Oral QHS  . atenolol  50 mg Oral Daily  . busPIRone  15 mg Oral TID  . citalopram  40 mg Oral Q breakfast  . docusate sodium  100 mg Oral BID  . hydrochlorothiazide  25 mg Oral Daily  . latanoprost  1 drop Both Eyes Daily  .  lisinopril  20 mg Oral Daily  . polyethylene glycol  17 g Oral Daily  . tiZANidine  4 mg Oral TID   Continuous: . piperacillin-tazobactam (ZOSYN)  IV 3.375 g (12/31/20 1350)   DXA:JOINOMVEHMCNOBS **OR** diphenhydrAMINE, famotidine, HYDROmorphone (DILAUDID) injection, metoprolol tartrate, nitroGLYCERIN, ondansetron **OR** ondansetron (ZOFRAN) IV, oxyCODONE, simethicone, traZODone  Allergies  Allergen Reactions  . Nsaids Nausea Only    Abdominal pain     ROS:  A comprehensive review of systems was negative except for: Constitutional: positive for fevers Gastrointestinal: positive for abdominal pain and nausea  Blood pressure 109/70, pulse 88, temperature 99.6 F (37.6 C), temperature source Oral, resp. rate 17, height 5\' 4"  (1.626 m), weight 90 kg, SpO2 94 %. Physical Exam Vitals reviewed.  Constitutional:      Appearance: She is well-developed.  HENT:     Head: Normocephalic.  Eyes:     Extraocular Movements: Extraocular movements intact.  Cardiovascular:     Rate and Rhythm: Normal rate.  Pulmonary:     Effort: Pulmonary effort is normal.  Abdominal:     Palpations: Abdomen is soft.     Tenderness: There is abdominal tenderness in the right lower quadrant and suprapubic area.     Hernia: No hernia is present.     Comments: Left sided ostomy pink and gas and stool in bag  Neurological:     General: No focal deficit present.     Mental Status: She is alert and oriented to person, place, and time.  Psychiatric:        Mood and Affect: Mood normal.        Behavior: Behavior normal.     Results: Results for orders placed or performed during the hospital encounter of 12/31/20 (from the past 48 hour(s))  Lipase, blood     Status: None   Collection Time: 12/31/20  5:00 AM  Result Value Ref Range   Lipase 28 11 - 51 U/L    Comment: Performed at [facility], [address], [place], [place] [address]  Comprehensive metabolic panel     Status: Abnormal   Collection Time:  12/31/20  5:00 AM  Result Value Ref Range   Sodium 135 135 - 145 mmol/L   Potassium 3.5 3.5 - 5.1 mmol/L   Chloride 95 (L) 98 - 111 mmol/L   CO2 31 22 - 32 mmol/L  Glucose, Bld 129 (H) 70 - 99 mg/dL    Comment: Glucose reference range applies only to samples taken after fasting for at least 8 hours.   BUN 5 (L) 8 - 23 mg/dL   Creatinine, Ser 0.67 0.44 - 1.00 mg/dL   Calcium 8.7 (L) 8.9 - 10.3 mg/dL   Total Protein 6.5 6.5 - 8.1 g/dL   Albumin 2.4 (L) 3.5 - 5.0 g/dL   AST 15 15 - 41 U/L   ALT 16 0 - 44 U/L   Alkaline Phosphatase 74 38 - 126 U/L   Total Bilirubin 0.7 0.3 - 1.2 mg/dL   GFR, Estimated >60 >60 mL/min    Comment: (NOTE) Calculated using the CKD-EPI Creatinine Equation (2021)    Anion gap 9 5 - 15    Comment: Performed at [facility], [address], [place], [place] [address]  CBC     Status: Abnormal   Collection Time: 12/31/20  5:00 AM  Result Value Ref Range   WBC 18.0 (H) 4.0 - 10.5 K/uL   RBC 3.64 (L) 3.87 - 5.11 MIL/uL   Hemoglobin 10.6 (L) 12.0 - 15.0 g/dL   HCT 32.3 (L) 36.0 - 46.0 %   MCV 88.7 80.0 - 100.0 fL   MCH 29.1 26.0 - 34.0 pg   MCHC 32.8 30.0 - 36.0 g/dL   RDW 14.6 11.5 - 15.5 %   Platelets 444 (H) 150 - 400 K/uL   nRBC 0.2 0.0 - 0.2 %    Comment: Performed at [facility], [address], [place], [place] [address]   personally reviewed- left lower abdomen with fluid and ostomy patent with stool in colon CT ABDOMEN PELVIS W CONTRAST  Result Date: 12/31/2020 CLINICAL DATA:  60 year old female with severe abdominal pain status post 12/21/2020 sigmoid resection, Hartmann's procedure with end colostomy for stercoral ulcer of sigmoid colon, fecal peritonitis. EXAM: CT ABDOMEN AND PELVIS WITH CONTRAST TECHNIQUE: Multidetector CT imaging of the abdomen and pelvis was performed using the standard protocol following bolus administration of intravenous contrast. CONTRAST:  175mL OMNIPAQUE IOHEXOL 300 MG/ML  SOLN COMPARISON:  Postoperative CT 12/26/2020 and  earlier. FINDINGS: Lower chest: Continued small layering pleural effusions. That on the right appears mildly increased. But bilateral lower lobe enhancing atelectasis has not significantly changed. No pericardial effusion. Hepatobiliary: Chronically absent gallbladder. Stable liver and bile ducts. Pancreas: Stable pancreatic atrophy. Spleen: Trace subcapsular splenic fluid suspected now on series 2, image 17, stable from the prior CT. Small peripheral splenic infarct is felt less likely. Elsewhere splenic enhancement maintained. Adrenals/Urinary Tract: Negative kidneys and ureters. Normal adrenal glands. Ureters appear symmetric and within normal limits on delayed excretory images. Unremarkable urinary bladder. Stomach/Bowel: Midline ventral abdominal wound healing by secondary intention appears satisfactory. Descending colostomy again noted. Blind-ending distal large bowel with staple line on series 2, image 67 at the pelvic inlet. Persistent small volume of extraluminal fluid adjacent to the staple line (series 2, image 67) is stable. But there is increased left abdominal fluid in the mesentery from the left upper quadrant to the left lower quadrant. Simple fluid density, but mild peritoneal thickening. See coronal image 49. The most confluent such area of fluid is in the left ventral lower quadrant encompassing 32 x 44 x 41 mm (AP by transverse by CC), volume estimated at 30 mL. Trace residual retroperitoneal air near the right adrenal gland. No free intraperitoneal air identified. Only trace fluid in the right gutter. Decompressed stomach and duodenum. No dilated small bowel. Small  bowel loops in the midline lower abdomen demonstrates mucosal hyperenhancement. There is retained fluid and liquid stool in the large bowel to the splenic flexure. Decompressed descending colostomy. Vascular/Lymphatic: Major arterial structures in the abdomen and pelvis remain patent. Portal venous system is patent. No  lymphadenopathy. Reproductive: Appears to be surgically absent as before. Other: Regressed anasarca. Trace pelvic free fluid and presacral stranding have regressed. Musculoskeletal: Chronic lower lumbar decompression and fusion. Stable visualized osseous structures. IMPRESSION: 1. Organizing mesenteric fluid in the left abdomen associated with some peritoneal thickening, suspicious for developing abscess. The most confluent such fluid is in the left lower quadrant measures 32 x 44 mm (estimated at 30 mL). See coronal image 48. 2. Otherwise stable/satisfactory postoperative appearance of the abdomen. Descending colostomy with no evidence of bowel obstruction. Although there is retained fluid and/or liquid stool in the proximal colon. Trace residual retroperitoneal gas. 3. Small pleural effusions and lower lobe atelectasis not significantly changed. Regressed anasarca since the CT on 12/26/2020. Electronically Signed   By: Genevie Ann M.D.   On: 12/31/2020 06:55     Assessment & Plan:  [name] is a 60 y.o. female with a likely intraabdominal abscess that is forming. She is having worsening pain and leukocytosis.   -IR to drain 2/23, will culture -Zosyn for now  -Labs in AM -Soft diet, NPO midnight -Colace and miralax for some constipation -PRN for pain, has chronic pain at baseline, will try to avoid dilaudid unless needed -SCDs, will hold lovenox for IR -Hopefully back home in next 2-3 days   All questions were answered to the satisfaction of the patient.   Virl Cagey 12/31/2020, 8:24 AM

## 2020-12-31 NOTE — ED Provider Notes (Signed)
[facility] EMERGENCY DEPARTMENT Provider Note   CSN: [id number] Arrival date & time: 12/31/20  0441   History Chief Complaint  Patient presents with  . Abdominal Pain    [name] is a 60 y.o. female.  The history is provided by the patient.  Abdominal Pain She has history of hypertension, hyperlipidemia seizure disorder and was discharged from the hospital 3 days ago following surgery for colonic perforation with incision left open and colostomy in place.  She states that she has developed severe right-sided abdominal pain over the last 12-24 hours and has not passed any stool through her colostomy although she is passing flatus.  There has been associated nausea but no vomiting.  Pain is severe and she rates it at 10/10.  She has not taken anything for pain.  She denies fever, chills, sweats.  Past Medical History:  Diagnosis Date  . Abdominal pain   . Anxiety   . Arthritis   . Blood in stool   . Breast changes, fibrocystic   . Bruises easily   . Cardiomegaly 2018  . Clotting disorder (HCC)     itp , none since 1976, no current hematologist  . Constipation   . DDD (degenerative disc disease), cervical    with lumbar issues  . Depression   . Generalized headaches   . GERD (gastroesophageal reflux disease)   . Hemorrhoid   . Hyperlipidemia   . Hypertension   . Idiopathic thrombocytopenia ([place])    as child  . Leg swelling    both ankles  . Liver lesion   . Nasal congestion   . Nausea & vomiting   . Neuromuscular disorder ([place])    back  injury  . Neuromuscular disorder ([place])    3 neck surgeries,neck fusion.pin,plates,screws  . Osteoporosis   . Panic attacks    pt also  has germaphobia  . Rectal bleeding   . Seizures ([place]) 1998   stress induced, one time, none since, no seizure meds  . Seizures ([place])    Stated she had 2 weeks ago,it was like a transient,stare.  . Sleep apnea    STOPBANG=4  . Spleen enlarged 1976, none since   r/t idiopathic  thrombocytopenia  . Trouble swallowing   . Unintentional weight loss   . Weakness    weakness varies    Patient Active Problem List   Diagnosis Date Noted  . E. coli sepsis -due to fecal peritonitis 12/23/2020  . Aspiration pneumonia of both lower lobes due to gastric secretions ([place]) 12/22/2020  . Stercoral ulcer of large intestine 12/21/2020  . Colon perforation due to stercoral ulcer with E. coli fecal peritonitis   . Under care of pain management specialist 05/30/2020  . Benzodiazepine dependence ([place]) 08/08/2018  . Controlled substance agreement broken 08/08/2018  . Hyperlipemia 08/05/2017  . Chest pain 06/30/2017  . AKI (acute kidney injury) ([place]) 07/09/2016  . Hypokalemia 07/09/2016  . Obesity (BMI 30-39.9) 07/02/2016  . Osteopenia 12/20/2013  . Diverticulosis of colon (without mention of hemorrhage) 02/04/2012  . GERD (gastroesophageal reflux disease) 02/04/2012  . Dysphagia 02/04/2012  . Unspecified gastritis and gastroduodenitis without mention of hemorrhage 02/04/2012  . Cystic liver mass 01/26/2012  . Cholecystitis chronic 01/26/2012  . SYNCOPE 12/20/2007  . GAD (generalized anxiety disorder) 01/25/2007  . Depression 01/25/2007  . Essential hypertension 01/25/2007  . BREAST MASSES, BILATERAL 01/25/2007  . DEGENERATIVE DISC DISEASE, CERVICAL SPINE 01/25/2007  . LOW BACK PAIN 01/25/2007  . SEIZURE DISORDER 01/25/2007  .  Insomnia 01/25/2007  . DEPENDENT EDEMA, LEGS, BILATERAL 01/25/2007  . Current smoker 01/25/2007  . TAH/BSO, HX OF 01/25/2007    Past Surgical History:  Procedure Laterality Date  . ABDOMINAL HYSTERECTOMY  1997  . BACK SURGERY    . CERVICAL SPINE SURGERY  2005, 2007, 2010   x3, fusion done,plates and screws present  . [place]  . CHOLECYSTECTOMY  03/14/2012   Procedure: LAPAROSCOPIC CHOLECYSTECTOMY;  Surgeon: Stark Klein, MD;  Location: [facility] ORS;  Service: General;  Laterality: N/A;  . COLECTOMY WITH COLOSTOMY  CREATION/HARTMANN PROCEDURE N/A 12/21/2020   Procedure: COLECTOMY WITH COLOSTOMY CREATION/HARTMANN PROCEDURE;  Surgeon: Virl Cagey, MD;  Location: [facility] ORS;  Service: General;  Laterality: N/A;  . colonscopy and esophagogastrodudononescopy  02-04-12  . LUMBAR DISC SURGERY  11/2014     OB History    Gravida  3   Para  3   Term  1   Preterm  2   AB      Living  3     SAB      IAB      Ectopic      Multiple      Live Births              Family History  Problem Relation Age of Onset  . Hypertension Mother   . Heart disease Mother   . Lung cancer Father   . Cancer Father        lung  . Colon cancer Paternal Grandmother   . Cancer Paternal Grandmother        colon  . Heart disease Maternal Grandmother   . Diabetes Sister   . Bipolar disorder Sister   . Heart disease Sister        Congestive Heart Failure  . Hypertension Sister     Social History   Tobacco Use  . Smoking status: Former Smoker    Packs/day: 1.00    Years: 12.00    Pack years: 12.00    Types: Cigarettes    Start date: 12/20/2005    Quit date: 09/20/2020    Years since quitting: 0.2  . Smokeless tobacco: Never Used  Vaping Use  . Vaping Use: Former  . Quit date: 09/07/2016  . Devices: only used for about a month  Substance Use Topics  . Alcohol use: No    Alcohol/week: 0.0 standard drinks  . Drug use: Yes    Types: Marijuana    Home Medications Prior to Admission medications   Medication Sig Start Date End Date Taking? Authorizing Provider  amitriptyline (ELAVIL) 25 MG tablet Take 25 mg by mouth at bedtime. 08/04/19   [provider]  amoxicillin-clavulanate (AUGMENTIN) 875-125 MG tablet Take 1 tablet by mouth every 12 (twelve) hours for 4 days. 12/27/20 12/31/20  Virl Cagey, MD  atenolol (TENORMIN) 50 MG tablet Take 1 tablet (50 mg total) by mouth daily. 09/06/20   [name], DO  busPIRone (BUSPAR) 15 MG tablet Take 1 tablet (15 mg total) by mouth 3  (three) times daily. 09/06/20   [name], DO  citalopram (CELEXA) 40 MG tablet Take 1 tablet (40 mg total) by mouth daily with breakfast. 09/06/20   Ronnie Doss M, DO  famotidine (PEPCID) 20 MG tablet Take 1 tablet (20 mg total) by mouth daily as needed for heartburn or indigestion. 08/16/20   Milus Banister, MD  hydrALAZINE (APRESOLINE) 50 MG tablet Take 1 tablet (50 mg total)  by mouth 2 (two) times daily as needed. 05/20/20 08/18/20  Verta Ellen., NP  latanoprost (XALATAN) 0.005 % ophthalmic solution Place 1 drop into both eyes daily. 12/13/20   [provider]  lisinopril-hydrochlorothiazide (ZESTORETIC) 20-25 MG tablet TAKE 1 TABLET BY MOUTH EVERY DAY 10/18/20   Branch, Alphonse Guild, MD  nitroGLYCERIN (NITROSTAT) 0.4 MG SL tablet Place 1 tablet (0.4 mg total) under the tongue every 5 (five) minutes as needed for chest pain. 05/30/20 08/28/20  Ronnie Doss M, DO  ondansetron (ZOFRAN) 4 MG tablet Take 4 mg by mouth every 8 (eight) hours as needed. for nausea 07/29/19   [provider]  oxyCODONE (OXY IR/ROXICODONE) 5 MG immediate release tablet Take 1 tablet (5 mg total) by mouth every 4 (four) hours as needed for severe pain or breakthrough pain. 12/27/20   Virl Cagey, MD  Oxycodone HCl 10 MG TABS Take 1 tablet (10 mg total) by mouth 2 (two) times daily. 12/28/20   Lysle Pearl, Isami, DO  rosuvastatin (CRESTOR) 20 MG tablet Take 1 tablet (20 mg total) by mouth daily. 06/03/20   [name], DO  tiZANidine (ZANAFLEX) 4 MG tablet Take 4 mg by mouth 3 (three) times daily. 10/17/20   [provider]  traZODone (DESYREL) 100 MG tablet Take 1.5 tablets (150 mg total) by mouth at bedtime as needed for sleep. 05/30/20   Ronnie Doss M, DO  hydrochlorothiazide (HYDRODIURIL) 25 MG tablet Take 25 mg by mouth daily.  05/20/20  [provider]    Allergies    Nsaids  Review of Systems   Review of Systems  Gastrointestinal: Positive for  abdominal pain.  All other systems reviewed and are negative.   Physical Exam Updated Vital Signs BP (!) 179/87 (BP Location: Left Arm)   Pulse 84   Temp 99.6 F (37.6 C) (Oral)   Resp 18   Ht 5\' 4"  (1.626 m)   Wt 90 kg   SpO2 95%   BMI 34.06 kg/m   Physical Exam Vitals and nursing note reviewed.   60 year old female, resting comfortably and in no acute distress. Vital signs are significant for elevated blood pressure. Oxygen saturation is 95%, which is normal. Head is normocephalic and atraumatic. PERRLA, EOMI. Oropharynx is clear. Neck is nontender and supple without adenopathy or JVD. Back is nontender and there is no CVA tenderness. Lungs are clear without rales, wheezes, or rhonchi. Chest is nontender. Heart has regular rate and rhythm without murmur. Abdomen: Midline lower abdominal incision present with open wound, no sign of infection.  Left lower quadrant colostomy present with flatus in the stoma bag.  Diffuse abdominal tenderness is present which is worse on the right side of the abdomen.  Rebound tenderness is present.  Bowel sounds are decreased but present. Extremities have no cyanosis or edema, full range of motion is present. Skin is warm and dry without rash. Neurologic: Mental status is normal, cranial nerves are intact, there are no motor or sensory deficits.  ED Results / Procedures / Treatments   Labs (all labs ordered are listed, but only abnormal results are displayed) Labs Reviewed  COMPREHENSIVE METABOLIC PANEL - Abnormal; Notable for the following components:      Result Value   Chloride 95 (*)    Glucose, Bld 129 (*)    BUN 5 (*)    Calcium 8.7 (*)    Albumin 2.4 (*)    All other components within normal limits  CBC - Abnormal;  Notable for the following components:   WBC 18.0 (*)    RBC 3.64 (*)    Hemoglobin 10.6 (*)    HCT 32.3 (*)    Platelets 444 (*)    All other components within normal limits  SARS CORONAVIRUS 2 (TAT 6-24 HRS)   LIPASE, BLOOD  URINALYSIS, ROUTINE W REFLEX MICROSCOPIC   Radiology CT ABDOMEN PELVIS W CONTRAST  Result Date: 12/31/2020 CLINICAL DATA:  60 year old female with severe abdominal pain status post 12/21/2020 sigmoid resection, Hartmann's procedure with end colostomy for stercoral ulcer of sigmoid colon, fecal peritonitis. EXAM: CT ABDOMEN AND PELVIS WITH CONTRAST TECHNIQUE: Multidetector CT imaging of the abdomen and pelvis was performed using the standard protocol following bolus administration of intravenous contrast. CONTRAST:  150mL OMNIPAQUE IOHEXOL 300 MG/ML  SOLN COMPARISON:  Postoperative CT 12/26/2020 and earlier. FINDINGS: Lower chest: Continued small layering pleural effusions. That on the right appears mildly increased. But bilateral lower lobe enhancing atelectasis has not significantly changed. No pericardial effusion. Hepatobiliary: Chronically absent gallbladder. Stable liver and bile ducts. Pancreas: Stable pancreatic atrophy. Spleen: Trace subcapsular splenic fluid suspected now on series 2, image 17, stable from the prior CT. Small peripheral splenic infarct is felt less likely. Elsewhere splenic enhancement maintained. Adrenals/Urinary Tract: Negative kidneys and ureters. Normal adrenal glands. Ureters appear symmetric and within normal limits on delayed excretory images. Unremarkable urinary bladder. Stomach/Bowel: Midline ventral abdominal wound healing by secondary intention appears satisfactory. Descending colostomy again noted. Blind-ending distal large bowel with staple line on series 2, image 67 at the pelvic inlet. Persistent small volume of extraluminal fluid adjacent to the staple line (series 2, image 67) is stable. But there is increased left abdominal fluid in the mesentery from the left upper quadrant to the left lower quadrant. Simple fluid density, but mild peritoneal thickening. See coronal image 49. The most confluent such area of fluid is in the left ventral lower  quadrant encompassing 32 x 44 x 41 mm (AP by transverse by CC), volume estimated at 30 mL. Trace residual retroperitoneal air near the right adrenal gland. No free intraperitoneal air identified. Only trace fluid in the right gutter. Decompressed stomach and duodenum. No dilated small bowel. Small bowel loops in the midline lower abdomen demonstrates mucosal hyperenhancement. There is retained fluid and liquid stool in the large bowel to the splenic flexure. Decompressed descending colostomy. Vascular/Lymphatic: Major arterial structures in the abdomen and pelvis remain patent. Portal venous system is patent. No lymphadenopathy. Reproductive: Appears to be surgically absent as before. Other: Regressed anasarca. Trace pelvic free fluid and presacral stranding have regressed. Musculoskeletal: Chronic lower lumbar decompression and fusion. Stable visualized osseous structures. IMPRESSION: 1. Organizing mesenteric fluid in the left abdomen associated with some peritoneal thickening, suspicious for developing abscess. The most confluent such fluid is in the left lower quadrant measures 32 x 44 mm (estimated at 30 mL). See coronal image 48. 2. Otherwise stable/satisfactory postoperative appearance of the abdomen. Descending colostomy with no evidence of bowel obstruction. Although there is retained fluid and/or liquid stool in the proximal colon. Trace residual retroperitoneal gas. 3. Small pleural effusions and lower lobe atelectasis not significantly changed. Regressed anasarca since the CT on 12/26/2020. Electronically Signed   By: Genevie Ann M.D.   On: 12/31/2020 06:55    Procedures Procedures   Medications Ordered in ED Medications - No data to display  ED Course  I have reviewed the triage vital signs and the nursing notes.  Pertinent labs & imaging results that were  available during my care of the patient were reviewed by me and considered in my medical decision making (see chart for details).  MDM  Rules/Calculators/A&P Abdominal pain and patient to status post recent colostomy and colon resection.  Exam is certainly concerning for possible peritonitis.  Will check screening labs and will send for repeat CT of abdomen and pelvis.  Old records are reviewed confirming recent colon surgery with colostomy.  CT scan shows organizing mesenteric fluid in the left abdomen with associated peritoneal thickening concerning for developing abscess, no other acute findings.  WBC is significantly elevated to 35.0, metabolic panel is unremarkable.  Hemoglobin is improved over discharge value.  Mild thrombocytosis is likely reactive.  Case is discussed with Dr. Arnoldo Morale, on-call for general surgery who states that the will discuss with Dr. Constance Haw who will come to the ED to evaluate the patient for probable readmission.  She will likely need to have this fluid collection drained by interventional radiology.  Final Clinical Impression(s) / ED Diagnoses Final diagnoses:  Postoperative intra-abdominal abscess  Normochromic normocytic anemia  Thrombocytosis    Rx / DC Orders ED Discharge Orders    None       Delora Fuel, MD 75/73/22 [phone]

## 2021-01-01 ENCOUNTER — Ambulatory Visit (HOSPITAL_COMMUNITY)
Admit: 2021-01-01 | Discharge: 2021-01-01 | Disposition: A | Discharge status: Home / Self Care | Payer: Medicare HMO | Attending: General Surgery | Admitting: General Surgery

## 2021-01-01 ENCOUNTER — Other Ambulatory Visit (HOSPITAL_COMMUNITY): Payer: Medicare HMO

## 2021-01-01 DIAGNOSIS — K651 Peritoneal abscess: Secondary | ICD-10-CM | POA: Diagnosis not present

## 2021-01-01 LAB — CBC WITH DIFFERENTIAL/PLATELET
Abs Immature Granulocytes: 0.3 10*3/uL — ABNORMAL HIGH (ref 0.00–0.07)
Basophils Absolute: 0.1 10*3/uL (ref 0.0–0.1)
Basophils Relative: 0 %
Eosinophils Absolute: 0.5 10*3/uL (ref 0.0–0.5)
Eosinophils Relative: 4 %
HCT: 30.4 % — ABNORMAL LOW (ref 36.0–46.0)
Hemoglobin: 9.7 g/dL — ABNORMAL LOW (ref 12.0–15.0)
Immature Granulocytes: 2 %
Lymphocytes Relative: 16 %
Lymphs Abs: 2.1 10*3/uL (ref 0.7–4.0)
MCH: 28.3 pg (ref 26.0–34.0)
MCHC: 31.9 g/dL (ref 30.0–36.0)
MCV: 88.6 fL (ref 80.0–100.0)
Monocytes Absolute: 1.3 10*3/uL — ABNORMAL HIGH (ref 0.1–1.0)
Monocytes Relative: 10 %
Neutro Abs: 8.8 10*3/uL — ABNORMAL HIGH (ref 1.7–7.7)
Neutrophils Relative %: 68 %
Platelets: 432 10*3/uL — ABNORMAL HIGH (ref 150–400)
RBC: 3.43 MIL/uL — ABNORMAL LOW (ref 3.87–5.11)
RDW: 14.5 % (ref 11.5–15.5)
WBC: 13 10*3/uL — ABNORMAL HIGH (ref 4.0–10.5)
nRBC: 0 % (ref 0.0–0.2)

## 2021-01-01 LAB — SUSCEPTIBILITY RESULT

## 2021-01-01 LAB — SUSCEPTIBILITY, AER + ANAEROB: Source of Sample: 8680

## 2021-01-01 MED ORDER — IOHEXOL 300 MG/ML  SOLN
100.0000 mL | Freq: Once | INTRAMUSCULAR | Status: AC | PRN
  Administered 2021-01-01: 100 mL via INTRAVENOUS

## 2021-01-01 MED ORDER — FENTANYL CITRATE (PF) 100 MCG/2ML IJ SOLN
INTRAMUSCULAR | Status: AC
  Filled 2021-01-01: qty 2

## 2021-01-01 MED ORDER — MIDAZOLAM HCL 2 MG/2ML IJ SOLN
INTRAMUSCULAR | Status: AC | PRN
  Administered 2021-01-01: 1 mg via INTRAVENOUS
  Administered 2021-01-01 (×2): 0.5 mg via INTRAVENOUS

## 2021-01-01 MED ORDER — ALPRAZOLAM 0.5 MG PO TABS
0.5000 mg | ORAL_TABLET | Freq: Three times a day (TID) | ORAL | Status: DC | PRN
  Administered 2021-01-01 – 2021-01-02 (×2): 0.5 mg via ORAL
  Filled 2021-01-01 (×2): qty 1

## 2021-01-01 MED ORDER — ONDANSETRON HCL 4 MG/2ML IJ SOLN
INTRAMUSCULAR | Status: AC
  Administered 2021-01-01: 4 mg via INTRAVENOUS
  Filled 2021-01-01: qty 2

## 2021-01-01 MED ORDER — MIDAZOLAM HCL 2 MG/2ML IJ SOLN
INTRAMUSCULAR | Status: AC
  Filled 2021-01-01: qty 2

## 2021-01-01 MED ORDER — ONDANSETRON HCL 4 MG/2ML IJ SOLN: 4.0000 mg | Freq: Once | INTRAMUSCULAR | Status: AC

## 2021-01-01 MED ORDER — FENTANYL CITRATE (PF) 100 MCG/2ML IJ SOLN
INTRAMUSCULAR | Status: AC | PRN
  Administered 2021-01-01: 25 ug via INTRAVENOUS
  Administered 2021-01-01: 50 ug via INTRAVENOUS
  Administered 2021-01-01: 25 ug via INTRAVENOUS

## 2021-01-01 MED ORDER — LIDOCAINE HCL 1 % IJ SOLN
INTRAMUSCULAR | Status: AC
  Filled 2021-01-01: qty 20

## 2021-01-01 NOTE — Progress Notes (Signed)
[facility] Progress Note     Subjective: Still with cramping abdominal pain. And nausea last night. Having ostomy output. Ready for drain. Leukocytosis down with zosyn yesterday.   Objective: Vital signs in last 24 hours: Temp:  [97.5 F (36.4 C)-98.7 F (37.1 C)] 98.4 F (36.9 C) (02/23 0714) Pulse Rate:  [66-81] 81 (02/23 0714) Resp:  [16-21] 16 (02/23 0714) BP: (108-169)/(53-107) 138/70 (02/23 0714) SpO2:  [86 %-99 %] 93 % (02/23 0714) Weight:  [84.4 kg] 84.4 kg (02/22 2015)    Intake/Output from previous day: 02/22 0701 - 02/23 0700 In: 121.8 [IV Piggyback:121.8] Out: 950 [Urine:500; Stool:450] Intake/Output this shift: No intake/output data recorded.  General appearance: alert, cooperative and no distress Resp: normal work of breathing GI: standing to get ready for transport, did not examine  Lab Results:  Recent Labs    12/31/20 0500 01/01/21 0509  WBC 18.0* 13.0*  HGB 10.6* 9.7*  HCT 32.3* 30.4*  PLT 444* 432*   BMET Recent Labs    12/31/20 0500  NA 135  K 3.5  CL 95*  CO2 31  GLUCOSE 129*  BUN 5*  CREATININE 0.67  CALCIUM 8.7*   PT/INR Recent Labs    12/31/20 0500  LABPROT 14.8  INR 1.2    Studies/Results: CT ABDOMEN PELVIS W CONTRAST  Result Date: 12/31/2020 CLINICAL DATA:  60 year old female with severe abdominal pain status post 12/21/2020 sigmoid resection, Hartmann's procedure with end colostomy for stercoral ulcer of sigmoid colon, fecal peritonitis. EXAM: CT ABDOMEN AND PELVIS WITH CONTRAST TECHNIQUE: Multidetector CT imaging of the abdomen and pelvis was performed using the standard protocol following bolus administration of intravenous contrast. CONTRAST:  162mL OMNIPAQUE IOHEXOL 300 MG/ML  SOLN COMPARISON:  Postoperative CT 12/26/2020 and earlier. FINDINGS: Lower chest: Continued small layering pleural effusions. That on the right appears mildly increased. But bilateral lower lobe enhancing atelectasis has not  significantly changed. No pericardial effusion. Hepatobiliary: Chronically absent gallbladder. Stable liver and bile ducts. Pancreas: Stable pancreatic atrophy. Spleen: Trace subcapsular splenic fluid suspected now on series 2, image 17, stable from the prior CT. Small peripheral splenic infarct is felt less likely. Elsewhere splenic enhancement maintained. Adrenals/Urinary Tract: Negative kidneys and ureters. Normal adrenal glands. Ureters appear symmetric and within normal limits on delayed excretory images. Unremarkable urinary bladder. Stomach/Bowel: Midline ventral abdominal wound healing by secondary intention appears satisfactory. Descending colostomy again noted. Blind-ending distal large bowel with staple line on series 2, image 67 at the pelvic inlet. Persistent small volume of extraluminal fluid adjacent to the staple line (series 2, image 67) is stable. But there is increased left abdominal fluid in the mesentery from the left upper quadrant to the left lower quadrant. Simple fluid density, but mild peritoneal thickening. See coronal image 49. The most confluent such area of fluid is in the left ventral lower quadrant encompassing 32 x 44 x 41 mm (AP by transverse by CC), volume estimated at 30 mL. Trace residual retroperitoneal air near the right adrenal gland. No free intraperitoneal air identified. Only trace fluid in the right gutter. Decompressed stomach and duodenum. No dilated small bowel. Small bowel loops in the midline lower abdomen demonstrates mucosal hyperenhancement. There is retained fluid and liquid stool in the large bowel to the splenic flexure. Decompressed descending colostomy. Vascular/Lymphatic: Major arterial structures in the abdomen and pelvis remain patent. Portal venous system is patent. No lymphadenopathy. Reproductive: Appears to be surgically absent as before. Other: Regressed anasarca. Trace pelvic free fluid and presacral stranding  have regressed. Musculoskeletal: Chronic  lower lumbar decompression and fusion. Stable visualized osseous structures. IMPRESSION: 1. Organizing mesenteric fluid in the left abdomen associated with some peritoneal thickening, suspicious for developing abscess. The most confluent such fluid is in the left lower quadrant measures 32 x 44 mm (estimated at 30 mL). See coronal image 48. 2. Otherwise stable/satisfactory postoperative appearance of the abdomen. Descending colostomy with no evidence of bowel obstruction. Although there is retained fluid and/or liquid stool in the proximal colon. Trace residual retroperitoneal gas. 3. Small pleural effusions and lower lobe atelectasis not significantly changed. Regressed anasarca since the CT on 12/26/2020. Electronically Signed   By: Genevie Ann M.D.   On: 12/31/2020 06:55    Anti-infectives: Anti-infectives (From admission, onward)   Start     Dose/Rate Route Frequency Ordered Stop   12/31/20 0830  piperacillin-tazobactam (ZOSYN) IVPB 3.375 g        3.375 g 12.5 mL/hr over 240 Minutes Intravenous Every 8 hours 12/31/20 6503        Assessment/Plan: [name] is a 60 yo w/ end colostomy after sterocoral perforation. Having low grade fever and leukocytosis now down on IV antibiotics but has a collection. Would continue with drainage to ensure does not continue to organize.  PRN for pain Diet after IR Will get home tomorrow after IR  Zosyn for leukocytosis/ intraabdominal collection   LOS: 1 day    Virl Cagey 01/01/2021

## 2021-01-01 NOTE — Sedation Documentation (Signed)
Patient transported via [place] back to [facility].  Patient is alert and oriented at time of transport playing on her phone.  Patient was complaining of pain with movement in the midline of abdomen from a prior surgery.  Patients vital signs are stable.  Report called to [facility] prior to transport.

## 2021-01-01 NOTE — Consult Note (Signed)
Chief Complaint: Patient was seen in consultation today for intra abdominal abscess drain placement Chief Complaint  Patient presents with  . Abdominal Pain   at the request of Dr Hayes Ludwig   Supervising Physician: Sandi Mariscal  Patient Status: [facility] IP  History of Present Illness: [name] is a 60 y.o. female   Hx perforated colon/stecoral ulcer End colostomy in OR 12/21/20 Did have persistent leukocytosis - home on oral antibiotics post op. Developed Rt abd pain and presented to ED [facility] 12/31/20  Has had BM last pm-- feels some better today  CT: IMPRESSION: 1. Organizing mesenteric fluid in the left abdomen associated with some peritoneal thickening, suspicious for developing abscess. The most confluent such fluid is in the left lower quadrant measures 32 x 44 mm (estimated at 30 mL). See coronal image 48. 2. Otherwise stable/satisfactory postoperative appearance of the abdomen. Descending colostomy with no evidence of bowel obstruction. Although there is retained fluid and/or liquid stool in the proximal colon. Trace residual retroperitoneal gas  Dr Constance Haw requesting abscess drain placement Dr Pascal Lux has reviewed imaging and approves procedure  Scheduled now for same   Past Medical History:  Diagnosis Date  . Abdominal pain   . Anxiety   . Arthritis   . Blood in stool   . Breast changes, fibrocystic   . Bruises easily   . Cardiomegaly 2018  . Clotting disorder (HCC)     itp , none since 1976, no current hematologist  . Constipation   . DDD (degenerative disc disease), cervical    with lumbar issues  . Depression   . Generalized headaches   . GERD (gastroesophageal reflux disease)   . Hemorrhoid   . Hyperlipidemia   . Hypertension   . Idiopathic thrombocytopenia ([place])    as child  . Leg swelling    both ankles  . Liver lesion   . Nasal congestion   . Nausea & vomiting   . Neuromuscular disorder ([place])    back  injury  . Neuromuscular  disorder ([place])    3 neck surgeries,neck fusion.pin,plates,screws  . Osteoporosis   . Panic attacks    pt also  has germaphobia  . Rectal bleeding   . Seizures ([place]) 1998   stress induced, one time, none since, no seizure meds  . Seizures ([place])    Stated she had 2 weeks ago,it was like a transient,stare.  . Sleep apnea    STOPBANG=4  . Spleen enlarged 1976, none since   r/t idiopathic thrombocytopenia  . Trouble swallowing   . Unintentional weight loss   . Weakness    weakness varies    Past Surgical History:  Procedure Laterality Date  . ABDOMINAL HYSTERECTOMY  1997  . BACK SURGERY    . CERVICAL SPINE SURGERY  2005, 2007, 2010   x3, fusion done,plates and screws present  . [place]  . CHOLECYSTECTOMY  03/14/2012   Procedure: LAPAROSCOPIC CHOLECYSTECTOMY;  Surgeon: Stark Klein, MD;  Location: [facility];  Service: General;  Laterality: N/A;  . COLECTOMY WITH COLOSTOMY CREATION/HARTMANN PROCEDURE N/A 12/21/2020   Procedure: COLECTOMY WITH COLOSTOMY CREATION/HARTMANN PROCEDURE;  Surgeon: Virl Cagey, MD;  Location: [facility];  Service: General;  Laterality: N/A;  . colonscopy and esophagogastrodudononescopy  02-04-12  . LUMBAR DISC SURGERY  11/2014    Allergies: Nsaids  Medications: Prior to Admission medications   Medication Sig Start Date End Date Taking? Authorizing Provider  amitriptyline (ELAVIL) 25 MG tablet Take 25  mg by mouth at bedtime. 08/04/19  Yes [provider]  atenolol (TENORMIN) 50 MG tablet Take 1 tablet (50 mg total) by mouth daily. 09/06/20  Yes Gottschalk, Ashly M, DO  busPIRone (BUSPAR) 15 MG tablet Take 1 tablet (15 mg total) by mouth 3 (three) times daily. 09/06/20  Yes Ronnie Doss M, DO  citalopram (CELEXA) 40 MG tablet Take 1 tablet (40 mg total) by mouth daily with breakfast. 09/06/20  Yes Gottschalk, Ashly M, DO  famotidine (PEPCID) 20 MG tablet Take 1 tablet (20 mg total) by mouth daily as needed for heartburn or  indigestion. 08/16/20  Yes Milus Banister, MD  latanoprost (XALATAN) 0.005 % ophthalmic solution Place 1 drop into both eyes daily. 12/13/20  Yes [provider]  lisinopril-hydrochlorothiazide (ZESTORETIC) 20-25 MG tablet TAKE 1 TABLET BY MOUTH EVERY DAY Patient taking differently: Take 1 tablet by mouth daily. 10/18/20  Yes Branch, Alphonse Guild, MD  ondansetron (ZOFRAN) 4 MG tablet Take 4 mg by mouth every 8 (eight) hours as needed. for nausea 07/29/19  Yes [provider]  oxyCODONE (OXY IR/ROXICODONE) 5 MG immediate release tablet Take 1 tablet (5 mg total) by mouth every 4 (four) hours as needed for severe pain or breakthrough pain. 12/27/20  Yes Virl Cagey, MD  Oxycodone HCl 10 MG TABS Take 1 tablet (10 mg total) by mouth 2 (two) times daily. 12/28/20  Yes Sakai, Isami, DO  rosuvastatin (CRESTOR) 20 MG tablet Take 1 tablet (20 mg total) by mouth daily. 06/03/20  Yes Gottschalk, Leatrice Jewels M, DO  tiZANidine (ZANAFLEX) 4 MG tablet Take 4 mg by mouth 3 (three) times daily. 10/17/20  Yes [provider]  traZODone (DESYREL) 100 MG tablet Take 1.5 tablets (150 mg total) by mouth at bedtime as needed for sleep. 05/30/20  Yes Ronnie Doss M, DO  hydrALAZINE (APRESOLINE) 50 MG tablet Take 1 tablet (50 mg total) by mouth 2 (two) times daily as needed. Patient not taking: Reported on 12/31/2020 05/20/20 08/18/20  Verta Ellen., NP  nitroGLYCERIN (NITROSTAT) 0.4 MG SL tablet Place 1 tablet (0.4 mg total) under the tongue every 5 (five) minutes as needed for chest pain. 05/30/20 08/28/20  [name], DO  hydrochlorothiazide (HYDRODIURIL) 25 MG tablet Take 25 mg by mouth daily.  05/20/20  [provider]     Family History  Problem Relation Age of Onset  . Hypertension Mother   . Heart disease Mother   . Lung cancer Father   . Cancer Father        lung  . Colon cancer Paternal Grandmother   . Cancer Paternal Grandmother        colon  . Heart disease  Maternal Grandmother   . Diabetes Sister   . Bipolar disorder Sister   . Heart disease Sister        Congestive Heart Failure  . Hypertension Sister     Social History   Socioeconomic History  . Marital status: Married    Spouse name: Legrand Como  . Number of children: 3  . Years of education: 21  . Highest education level: Associate degree: academic program  Occupational History  . Occupation: Diabled    Employer: UNEMPLOYED  Tobacco Use  . Smoking status: Former Smoker    Packs/day: 1.00    Years: 12.00    Pack years: 12.00    Types: Cigarettes    Start date: 12/20/2005    Quit date: 09/20/2020    Years since quitting:  0.2  . Smokeless tobacco: Never Used  Vaping Use  . Vaping Use: Former  . Quit date: 09/07/2016  . Devices: only used for about a month  Substance and Sexual Activity  . Alcohol use: No    Alcohol/week: 0.0 standard drinks  . Drug use: Yes    Types: Marijuana  . Sexual activity: Yes  Other Topics Concern  . Not on file  Social History Narrative   She lives with her boyfriend.  She used to smoke, quit May 2013, she is not drinking. She has 3 children   Social Determinants of Radio broadcast assistant Strain: Not on file  Food Insecurity: Not on file  Transportation Needs: Not on file  Physical Activity: Not on file  Stress: Not on file  Social Connections: Not on file    Review of Systems: A 12 point ROS discussed and pertinent positives are indicated in the HPI above.  All other systems are negative.  Review of Systems  Constitutional: Positive for activity change and appetite change. Negative for fatigue and fever.  Respiratory: Negative for cough and shortness of breath.   Cardiovascular: Negative for chest pain.  Gastrointestinal: Positive for abdominal pain and nausea.  Psychiatric/Behavioral: Negative for behavioral problems and confusion.    Vital Signs: BP 138/70 (BP Location: Left Arm)   Pulse 81   Temp 98.4 F (36.9 C) (Oral)    Resp 16   Ht 5\' 4"  (1.626 m)   Wt 186 lb 1.1 oz (84.4 kg)   SpO2 93%   BMI 31.94 kg/m   Physical Exam Vitals reviewed.  HENT:     Mouth/Throat:     Mouth: Mucous membranes are moist.  Cardiovascular:     Rate and Rhythm: Normal rate and regular rhythm.  Pulmonary:     Effort: Pulmonary effort is normal.     Breath sounds: Normal breath sounds.  Abdominal:     Palpations: Abdomen is soft.     Tenderness: There is abdominal tenderness in the right lower quadrant.  Skin:    General: Skin is warm.  Neurological:     Mental Status: She is alert and oriented to person, place, and time.  Psychiatric:        Behavior: Behavior normal.     Imaging: CT ABDOMEN PELVIS W CONTRAST  Result Date: 12/31/2020 CLINICAL DATA:  60 year old female with severe abdominal pain status post 12/21/2020 sigmoid resection, Hartmann's procedure with end colostomy for stercoral ulcer of sigmoid colon, fecal peritonitis. EXAM: CT ABDOMEN AND PELVIS WITH CONTRAST TECHNIQUE: Multidetector CT imaging of the abdomen and pelvis was performed using the standard protocol following bolus administration of intravenous contrast. CONTRAST:  129mL OMNIPAQUE IOHEXOL 300 MG/ML  SOLN COMPARISON:  Postoperative CT 12/26/2020 and earlier. FINDINGS: Lower chest: Continued small layering pleural effusions. That on the right appears mildly increased. But bilateral lower lobe enhancing atelectasis has not significantly changed. No pericardial effusion. Hepatobiliary: Chronically absent gallbladder. Stable liver and bile ducts. Pancreas: Stable pancreatic atrophy. Spleen: Trace subcapsular splenic fluid suspected now on series 2, image 17, stable from the prior CT. Small peripheral splenic infarct is felt less likely. Elsewhere splenic enhancement maintained. Adrenals/Urinary Tract: Negative kidneys and ureters. Normal adrenal glands. Ureters appear symmetric and within normal limits on delayed excretory images. Unremarkable urinary  bladder. Stomach/Bowel: Midline ventral abdominal wound healing by secondary intention appears satisfactory. Descending colostomy again noted. Blind-ending distal large bowel with staple line on series 2, image 67 at the pelvic inlet. Persistent small  volume of extraluminal fluid adjacent to the staple line (series 2, image 67) is stable. But there is increased left abdominal fluid in the mesentery from the left upper quadrant to the left lower quadrant. Simple fluid density, but mild peritoneal thickening. See coronal image 49. The most confluent such area of fluid is in the left ventral lower quadrant encompassing 32 x 44 x 41 mm (AP by transverse by CC), volume estimated at 30 mL. Trace residual retroperitoneal air near the right adrenal gland. No free intraperitoneal air identified. Only trace fluid in the right gutter. Decompressed stomach and duodenum. No dilated small bowel. Small bowel loops in the midline lower abdomen demonstrates mucosal hyperenhancement. There is retained fluid and liquid stool in the large bowel to the splenic flexure. Decompressed descending colostomy. Vascular/Lymphatic: Major arterial structures in the abdomen and pelvis remain patent. Portal venous system is patent. No lymphadenopathy. Reproductive: Appears to be surgically absent as before. Other: Regressed anasarca. Trace pelvic free fluid and presacral stranding have regressed. Musculoskeletal: Chronic lower lumbar decompression and fusion. Stable visualized osseous structures. IMPRESSION: 1. Organizing mesenteric fluid in the left abdomen associated with some peritoneal thickening, suspicious for developing abscess. The most confluent such fluid is in the left lower quadrant measures 32 x 44 mm (estimated at 30 mL). See coronal image 48. 2. Otherwise stable/satisfactory postoperative appearance of the abdomen. Descending colostomy with no evidence of bowel obstruction. Although there is retained fluid and/or liquid stool in the  proximal colon. Trace residual retroperitoneal gas. 3. Small pleural effusions and lower lobe atelectasis not significantly changed. Regressed anasarca since the CT on 12/26/2020. Electronically Signed   By: Genevie Ann M.D.   On: 12/31/2020 06:55   CT ABDOMEN PELVIS W CONTRAST  Result Date: 12/26/2020 CLINICAL DATA:  Persistent mid to low abdominal pain, abscess or infection suspected, status post colon perforation and resection EXAM: CT ABDOMEN AND PELVIS WITH CONTRAST TECHNIQUE: Multidetector CT imaging of the abdomen and pelvis was performed using the standard protocol following bolus administration of intravenous contrast. CONTRAST:  152mL OMNIPAQUE IOHEXOL 300 MG/ML SOLN, additional oral enteric contrast COMPARISON:  12/21/2020 FINDINGS: Lower chest: Small bilateral pleural effusions associated atelectasis or consolidation. Hepatobiliary: No focal liver abnormality is seen. Status post cholecystectomy. Postop biliary dilatation. Pancreas: Unremarkable. No pancreatic ductal dilatation or surrounding inflammatory changes. Spleen: Normal in size without significant abnormality. Adrenals/Urinary Tract: Adrenal glands are unremarkable. Kidneys are normal, without renal calculi, solid lesion, or hydronephrosis. Bladder is unremarkable. Stomach/Bowel: Stomach is within normal limits. There are contrast and air filled, although not overly distended loops of mid to distal small bowel in the mid abdomen, largest caliber 3.6 cm (series 2, image 48). Appendix appears normal. Status post interval sigmoid colon resection and left lower quadrant end ostomy. There are scattered, tiny air loculations persisting in the right lower quadrant (series 2, image 64). Vascular/Lymphatic: Aortic atherosclerosis. No enlarged abdominal or pelvic lymph nodes. Reproductive: No mass or other significant abnormality. Other: Low midline laparotomy with open, dressed wound. New small volume ascites throughout the abdomen and pelvis. Anasarca.  Musculoskeletal: No acute or significant osseous findings. IMPRESSION: 1. Status post interval sigmoid colon resection and left lower quadrant end ostomy. There are scattered, tiny air loculations persisting in the right lower quadrant in keeping with perforation noted on prior examination. No evidence of discrete intra-abdominal fluid collection or other postoperative complication. 2. There are contrast and air filled, although not overly distended loops of mid to distal small bowel in the mid abdomen,  largest caliber 3.6 cm. Findings are most consistent with postoperative ileus. There is gas and stool present to the ostomy 3. New small volume ascites throughout the abdomen and pelvis. Anasarca. 4. New small bilateral pleural effusions and associated atelectasis or consolidation. Aortic Atherosclerosis (ICD10-I70.0). Electronically Signed   By: Eddie Candle M.D.   On: 12/26/2020 15:00   CT ABDOMEN PELVIS W CONTRAST  Result Date: 12/21/2020 CLINICAL DATA:  Abdominal distension, bowel obstruction suspected EXAM: CT ABDOMEN AND PELVIS WITH CONTRAST TECHNIQUE: Multidetector CT imaging of the abdomen and pelvis was performed using the standard protocol following bolus administration of intravenous contrast. CONTRAST:  112mL OMNIPAQUE IOHEXOL 300 MG/ML  SOLN COMPARISON:  11/15/2015 FINDINGS: Lower chest: No acute abnormality. Hepatobiliary: No focal liver abnormality is seen. Status post cholecystectomy. Mild postoperative biliary dilatation. Pancreas: Unremarkable. No pancreatic ductal dilatation or surrounding inflammatory changes. Spleen: Normal in size without significant abnormality. Adrenals/Urinary Tract: Adrenal glands are unremarkable. Kidneys are normal, without renal calculi, solid lesion, or hydronephrosis. Bladder is unremarkable. Stomach/Bowel: The stomach and lower esophagus are fluid-filled. Appendix appears normal in a retrocecal lie. There is a moderate burden of stool throughout the colon, with a  large stool ball in the distal sigmoid colon and adjacent extraluminal air (series 2, image 68). Vascular/Lymphatic: Aortic atherosclerosis. No enlarged abdominal or pelvic lymph nodes. Reproductive: Status post hysterectomy. Other: No abdominal wall hernia or abnormality. Small volume pneumoperitoneum and retroperitoneal emphysema. Trace fluid in the right lower quadrant. Musculoskeletal: No acute or significant osseous findings. IMPRESSION: 1. There is a moderate burden of stool throughout the colon, with a large stool ball in the distal sigmoid colon and adjacent extraluminal air throughout the mesentery and retroperitoneum. Findings are consistent with bowel perforation, likely secondary to stercoral ulceration. An obstipating mass of the distal sigmoid would be difficult to exclude and could be further investigated by colonoscopy. 2. Small volume pneumoperitoneum and fluid in the right lower quadrant. These results were called by telephone at the time of interpretation on 12/21/2020 at 6:14 pm to PA [facility] , who verbally acknowledged these results. Aortic Atherosclerosis (ICD10-I70.0). Electronically Signed   By: Eddie Candle M.D.   On: 12/21/2020 18:14   DG CHEST PORT 1 VIEW  Result Date: 12/23/2020 CLINICAL DATA:  Dyspnea. EXAM: PORTABLE CHEST 1 VIEW COMPARISON:  December 22, 2020. FINDINGS: Endotracheal tube tip approximately 13 mm above the carina. Right IJ central venous catheter tip projecting at the right atrium. Gastric tube courses below the diaphragm outside the field of view. Free air seen on prior radiograph and on the prior CT is less conspicuous on this exam. Similar streaky bibasilar opacities with low lung volumes. No new confluent consolidation. No visible pleural effusions or pneumothorax. Similar cardiomediastinal silhouette. Aortic atherosclerosis. Partially imaged ACDF. IMPRESSION: 1. Endotracheal tube tip approximately 13 mm above the carina. Retraction by approximately 2.5 cm  would place the tip at the level of the clavicular heads. 2. Similar streaky bibasilar opacities, likely atelectasis. 3. Free air seen on prior radiograph and on the prior CT is less conspicuous on this exam. Electronically Signed   By: Margaretha Sheffield MD   On: 12/23/2020 07:50   DG Chest Port 1 View  Result Date: 12/22/2020 CLINICAL DATA:  Endotracheal tube placement. EXAM: PORTABLE CHEST 1 VIEW COMPARISON:  Chest radiograph 06/06/2020, included lung bases from abdominal CT earlier today. FINDINGS: Endotracheal tube tip 13 mm from the carina. Tip and side port of the enteric tube below the diaphragm in the stomach. Right  central line tip in the right atrium. Heart is normal in size. Curvilinear lucency abutting the right heart border corresponds to free air under the hemidiaphragm. Bibasilar atelectasis, increased. No pneumothorax. No significant pleural effusion. IMPRESSION: 1. Endotracheal tube tip 13 mm from the carina. Enteric tube tip and side port below the diaphragm in the stomach. 2. Right central line tip in the right atrium. 3. Increased bibasilar atelectasis. 4. Known free air in the upper abdomen characterized on CT. Electronically Signed   By: Keith Rake M.D.   On: 12/22/2020 00:11    Labs:  CBC: Recent Labs    12/27/20 0545 12/28/20 0554 12/31/20 0500 01/01/21 0509  WBC 17.3* 14.5* 18.0* 13.0*  HGB 9.3* 9.7* 10.6* 9.7*  HCT 28.6* 30.7* 32.3* 30.4*  PLT 281 309 444* 432*    COAGS: Recent Labs    12/31/20 0500  INR 1.2    BMP: Recent Labs    01/29/20 1149 05/30/20 1645 12/21/20 1650 12/25/20 0507 12/27/20 0545 12/28/20 0554 12/31/20 0500  NA 144 136   < > 135 135 138 135  K 4.4 4.1   < > 3.4* 3.6 3.8 3.5  CL 103 94*   < > 96* 99 101 95*  CO2 28 28   < > 30 30 27 31   GLUCOSE 90 89   < > 113* 137* 123* 129*  BUN 9 16   < > 14 11 14  5*  CALCIUM 9.4 9.9   < > 8.4* 8.2* 8.5* 8.7*  CREATININE 0.66 0.78   < > 0.47 0.44 0.59 0.67  GFRNONAA 96 82   < > >60  >60 >60 >60  GFRAA 111 95  --   --   --   --   --    < > = values in this interval not displayed.    LIVER FUNCTION TESTS: Recent Labs    12/22/20 0339 12/23/20 0411 12/27/20 0545 12/28/20 0554 12/31/20 0500  BILITOT 1.2 1.3* 0.6  --  0.7  AST 63* 32 18  --  15  ALT 77* 45* 24  --  16  ALKPHOS 79 72 62  --  74  PROT 4.9* 4.9* 5.4*  --  6.5  ALBUMIN 2.6* 2.2* 2.3* 2.3* 2.4*    TUMOR MARKERS: No results for input(s): AFPTM, CEA, CA199, CHROMGRNA in the last 8760 hours.  Assessment and Plan:  Perforated colon End colostomy 12/21/20 New abd pain and persistent leukocytosis CT revealing abdominal abscess collection Scheduled now for abscess drain placement Risks and benefits discussed with the patient including bleeding, infection, damage to adjacent structures, bowel perforation/fistula connection, and sepsis.  All of the patient's questions were answered, patient is agreeable to proceed. Consent signed and in chart.  Thank you for this interesting consult.  I greatly enjoyed meeting [name] and look forward to participating in their care.  A copy of this report was sent to the requesting provider on this date.  Electronically Signed: Lavonia Drafts, PA-C 01/01/2021, 7:38 AM   I spent a total of 20 Minutes    in face to face in clinical consultation, greater than 50% of which was counseling/coordinating care for intra abdominal abscess drain

## 2021-01-01 NOTE — Procedures (Signed)
Interventional Radiology Procedure Note  Procedure: LLQ abdominal drain  Indication: Intraperitoneal fluid after colon perforation  Findings: Please refer to procedural dictation for full description.  Complications: None  EBL: < 10 mL  Miachel Roux, MD [phone]

## 2021-01-01 NOTE — Plan of Care (Signed)

## 2021-01-02 ENCOUNTER — Ambulatory Visit: Payer: Medicare HMO | Admitting: General Surgery

## 2021-01-02 DIAGNOSIS — K6811 Postprocedural retroperitoneal abscess: Secondary | ICD-10-CM | POA: Diagnosis not present

## 2021-01-02 DIAGNOSIS — Z978 Presence of other specified devices: Secondary | ICD-10-CM | POA: Diagnosis not present

## 2021-01-02 LAB — CBC WITH DIFFERENTIAL/PLATELET
Abs Immature Granulocytes: 0.16 K/uL — ABNORMAL HIGH (ref 0.00–0.07)
Basophils Absolute: 0.1 K/uL (ref 0.0–0.1)
Basophils Relative: 1 %
Eosinophils Absolute: 0.3 K/uL (ref 0.0–0.5)
Eosinophils Relative: 3 %
HCT: 29.7 % — ABNORMAL LOW (ref 36.0–46.0)
Hemoglobin: 9.6 g/dL — ABNORMAL LOW (ref 12.0–15.0)
Immature Granulocytes: 1 %
Lymphocytes Relative: 19 %
Lymphs Abs: 2.3 K/uL (ref 0.7–4.0)
MCH: 28.5 pg (ref 26.0–34.0)
MCHC: 32.3 g/dL (ref 30.0–36.0)
MCV: 88.1 fL (ref 80.0–100.0)
Monocytes Absolute: 1.2 K/uL — ABNORMAL HIGH (ref 0.1–1.0)
Monocytes Relative: 10 %
Neutro Abs: 8.1 K/uL — ABNORMAL HIGH (ref 1.7–7.7)
Neutrophils Relative %: 66 %
Platelets: 495 K/uL — ABNORMAL HIGH (ref 150–400)
RBC: 3.37 MIL/uL — ABNORMAL LOW (ref 3.87–5.11)
RDW: 14.5 % (ref 11.5–15.5)
WBC: 12.3 K/uL — ABNORMAL HIGH (ref 4.0–10.5)
nRBC: 0 % (ref 0.0–0.2)

## 2021-01-02 MED ORDER — AMOXICILLIN-POT CLAVULANATE 875-125 MG PO TABS: 1.0000 | ORAL_TABLET | Freq: Two times a day (BID) | ORAL | 0 refills | Status: AC

## 2021-01-02 NOTE — Progress Notes (Addendum)
Pt taught with return demonstration on how to care for JP drain. Pt understands how to manage colostomy and wound care for midline abdominal incision

## 2021-01-02 NOTE — Progress Notes (Signed)
Referring Physician(s): Dr Hayes Ludwig  Supervising Physician: Mir, Sharen Heck  Patient Status:  AP IP  Chief Complaint:  LLQ abdominal abscess  Subjective:  LLQ abdominl abscess drain placed in IR 01/01/21 Up in bed  Although yesterday was difficult with procedure and all feeling a little better today abd sore    Allergies: Nsaids  Medications: Prior to Admission medications   Medication Sig Start Date End Date Taking? Authorizing Provider  amitriptyline (ELAVIL) 25 MG tablet Take 25 mg by mouth at bedtime. 08/04/19  Yes [provider]  atenolol (TENORMIN) 50 MG tablet Take 1 tablet (50 mg total) by mouth daily. 09/06/20  Yes Gottschalk, Ashly M, DO  busPIRone (BUSPAR) 15 MG tablet Take 1 tablet (15 mg total) by mouth 3 (three) times daily. 09/06/20  Yes Ronnie Doss M, DO  citalopram (CELEXA) 40 MG tablet Take 1 tablet (40 mg total) by mouth daily with breakfast. 09/06/20  Yes Gottschalk, Ashly M, DO  famotidine (PEPCID) 20 MG tablet Take 1 tablet (20 mg total) by mouth daily as needed for heartburn or indigestion. 08/16/20  Yes Milus Banister, MD  latanoprost (XALATAN) 0.005 % ophthalmic solution Place 1 drop into both eyes daily. 12/13/20  Yes [provider]  lisinopril-hydrochlorothiazide (ZESTORETIC) 20-25 MG tablet TAKE 1 TABLET BY MOUTH EVERY DAY Patient taking differently: Take 1 tablet by mouth daily. 10/18/20  Yes Branch, Alphonse Guild, MD  ondansetron (ZOFRAN) 4 MG tablet Take 4 mg by mouth every 8 (eight) hours as needed. for nausea 07/29/19  Yes [provider]  oxyCODONE (OXY IR/ROXICODONE) 5 MG immediate release tablet Take 1 tablet (5 mg total) by mouth every 4 (four) hours as needed for severe pain or breakthrough pain. 12/27/20  Yes Virl Cagey, MD  Oxycodone HCl 10 MG TABS Take 1 tablet (10 mg total) by mouth 2 (two) times daily. 12/28/20  Yes Sakai, Isami, DO  rosuvastatin (CRESTOR) 20 MG tablet Take 1 tablet (20 mg total) by  mouth daily. 06/03/20  Yes Gottschalk, Leatrice Jewels M, DO  tiZANidine (ZANAFLEX) 4 MG tablet Take 4 mg by mouth 3 (three) times daily. 10/17/20  Yes [provider]  traZODone (DESYREL) 100 MG tablet Take 1.5 tablets (150 mg total) by mouth at bedtime as needed for sleep. 05/30/20  Yes Ronnie Doss M, DO  hydrALAZINE (APRESOLINE) 50 MG tablet Take 1 tablet (50 mg total) by mouth 2 (two) times daily as needed. Patient not taking: Reported on 12/31/2020 05/20/20 08/18/20  Verta Ellen., NP  nitroGLYCERIN (NITROSTAT) 0.4 MG SL tablet Place 1 tablet (0.4 mg total) under the tongue every 5 (five) minutes as needed for chest pain. 05/30/20 08/28/20  [name], DO  hydrochlorothiazide (HYDRODIURIL) 25 MG tablet Take 25 mg by mouth daily.  05/20/20  [provider]     Vital Signs: BP 136/62   Pulse 80   Temp 99 F (37.2 C) (Oral)   Resp 18   Ht 5\' 4"  (1.626 m)   Wt 186 lb 1.1 oz (84.4 kg)   SpO2 95%   BMI 31.94 kg/m   Physical Exam Skin:    General: Skin is warm.     Comments: Site is clean and dry Tender OP pale yellow clear fluid 30 cc OP and 10 cc in JP  Wbc 12.3 today   Neurological:     Mental Status: She is alert.     Imaging: CT ABDOMEN PELVIS W CONTRAST  Result Date: 12/31/2020 CLINICAL  DATA:  60 year old female with severe abdominal pain status post 12/21/2020 sigmoid resection, Hartmann's procedure with end colostomy for stercoral ulcer of sigmoid colon, fecal peritonitis. EXAM: CT ABDOMEN AND PELVIS WITH CONTRAST TECHNIQUE: Multidetector CT imaging of the abdomen and pelvis was performed using the standard protocol following bolus administration of intravenous contrast. CONTRAST:  129mL OMNIPAQUE IOHEXOL 300 MG/ML  SOLN COMPARISON:  Postoperative CT 12/26/2020 and earlier. FINDINGS: Lower chest: Continued small layering pleural effusions. That on the right appears mildly increased. But bilateral lower lobe enhancing atelectasis has not  significantly changed. No pericardial effusion. Hepatobiliary: Chronically absent gallbladder. Stable liver and bile ducts. Pancreas: Stable pancreatic atrophy. Spleen: Trace subcapsular splenic fluid suspected now on series 2, image 17, stable from the prior CT. Small peripheral splenic infarct is felt less likely. Elsewhere splenic enhancement maintained. Adrenals/Urinary Tract: Negative kidneys and ureters. Normal adrenal glands. Ureters appear symmetric and within normal limits on delayed excretory images. Unremarkable urinary bladder. Stomach/Bowel: Midline ventral abdominal wound healing by secondary intention appears satisfactory. Descending colostomy again noted. Blind-ending distal large bowel with staple line on series 2, image 67 at the pelvic inlet. Persistent small volume of extraluminal fluid adjacent to the staple line (series 2, image 67) is stable. But there is increased left abdominal fluid in the mesentery from the left upper quadrant to the left lower quadrant. Simple fluid density, but mild peritoneal thickening. See coronal image 49. The most confluent such area of fluid is in the left ventral lower quadrant encompassing 32 x 44 x 41 mm (AP by transverse by CC), volume estimated at 30 mL. Trace residual retroperitoneal air near the right adrenal gland. No free intraperitoneal air identified. Only trace fluid in the right gutter. Decompressed stomach and duodenum. No dilated small bowel. Small bowel loops in the midline lower abdomen demonstrates mucosal hyperenhancement. There is retained fluid and liquid stool in the large bowel to the splenic flexure. Decompressed descending colostomy. Vascular/Lymphatic: Major arterial structures in the abdomen and pelvis remain patent. Portal venous system is patent. No lymphadenopathy. Reproductive: Appears to be surgically absent as before. Other: Regressed anasarca. Trace pelvic free fluid and presacral stranding have regressed. Musculoskeletal: Chronic  lower lumbar decompression and fusion. Stable visualized osseous structures. IMPRESSION: 1. Organizing mesenteric fluid in the left abdomen associated with some peritoneal thickening, suspicious for developing abscess. The most confluent such fluid is in the left lower quadrant measures 32 x 44 mm (estimated at 30 mL). See coronal image 48. 2. Otherwise stable/satisfactory postoperative appearance of the abdomen. Descending colostomy with no evidence of bowel obstruction. Although there is retained fluid and/or liquid stool in the proximal colon. Trace residual retroperitoneal gas. 3. Small pleural effusions and lower lobe atelectasis not significantly changed. Regressed anasarca since the CT on 12/26/2020. Electronically Signed   By: Genevie Ann M.D.   On: 12/31/2020 06:55   CT IMAGE GUIDED FLUID DRAIN BY CATHETER  Result Date: 01/01/2021 INDICATION: 60 year old woman with perforated colonic ulcer status post end colostomy on 12/21/2020 has persistent leukocytosis and intra-abdominal fluid collection. Interventional radiology consulted for drain placement. EXAM: CT-guided left lower quadrant abscess drain placement. MEDICATIONS: The patient is currently admitted to the hospital and receiving intravenous antibiotics. The antibiotics were administered within an appropriate time frame prior to the initiation of the procedure. ANESTHESIA/SEDATION: Fentanyl 150 mcg IV; Versed 2 mg IV Moderate Sedation Time:  35 minutes The patient was continuously monitored during the procedure by the interventional radiology nurse under my direct supervision. COMPLICATIONS: None immediate. PROCEDURE:  Informed written consent was obtained from the patient after a thorough discussion of the procedural risks, benefits and alternatives. All questions were addressed. Maximal Sterile Barrier Technique was utilized including caps, mask, sterile gowns, sterile gloves, sterile drape, hand hygiene and skin antiseptic. A timeout was performed  prior to the initiation of the procedure. Patient positioned supine on the procedure table. Contrast enhanced CT was performed to better delineate bowel from fluid collection. The left lower quadrant skin prepped and draped in usual fashion. Following local lidocaine administration, 17 gauge needle advanced into the largest pocket of left lower quadrant intraperitoneal fluid utilizing CT guidance. The 17 gauge needle was exchanged for 10.2 [place] multipurpose pigtail drain over 0.035 inch guidewire. Drain secured to skin with suture and connected to bulb. 10 mL sample was sent for Gram stain and culture. IMPRESSION: 10.2 [place] multipurpose pigtail drain placed in left lower quadrant fluid collection. PLAN: Consider repeat CT imaging once output is less than 10 mL per day (excluding flush volumes) for 2 consecutive days. If discharged home with drain, please follow up with IR drain clinic [phone]) at the discretion of ordering clinician. Electronically Signed   By: Miachel Roux M.D.   On: 01/01/2021 16:12    Labs:  CBC: Recent Labs    12/28/20 0554 12/31/20 0500 01/01/21 0509 01/02/21 0758  WBC 14.5* 18.0* 13.0* 12.3*  HGB 9.7* 10.6* 9.7* 9.6*  HCT 30.7* 32.3* 30.4* 29.7*  PLT 309 444* 432* 495*    COAGS: Recent Labs    12/31/20 0500  INR 1.2    BMP: Recent Labs    01/29/20 1149 05/30/20 1645 12/21/20 1650 12/25/20 0507 12/27/20 0545 12/28/20 0554 12/31/20 0500  NA 144 136   < > 135 135 138 135  K 4.4 4.1   < > 3.4* 3.6 3.8 3.5  CL 103 94*   < > 96* 99 101 95*  CO2 28 28   < > 30 30 27 31   GLUCOSE 90 89   < > 113* 137* 123* 129*  BUN 9 16   < > 14 11 14  5*  CALCIUM 9.4 9.9   < > 8.4* 8.2* 8.5* 8.7*  CREATININE 0.66 0.78   < > 0.47 0.44 0.59 0.67  GFRNONAA 96 82   < > >60 >60 >60 >60  GFRAA 111 95  --   --   --   --   --    < > = values in this interval not displayed.    LIVER FUNCTION TESTS: Recent Labs    12/22/20 0339 12/23/20 0411 12/27/20 0545  12/28/20 0554 12/31/20 0500  BILITOT 1.2 1.3* 0.6  --  0.7  AST 63* 32 18  --  15  ALT 77* 45* 24  --  16  ALKPHOS 79 72 62  --  74  PROT 4.9* 4.9* 5.4*  --  6.5  ALBUMIN 2.6* 2.2* 2.3* 2.3* 2.4*    Assessment and Plan:  Intra abd abscess drain placed LLQ- in IR yesterday Will follow drain If home with drain- will need Rx flushes - 5 cc flush daily Record OP She will hear from [facility] for follow up time and date Will place OP orders  Electronically Signed: Lavonia Drafts, PA-C 01/02/2021, 8:32 AM   I spent a total of 15 Minutes at the the patient's bedside AND on the patient's hospital floor or unit, greater than 50% of which was counseling/coordinating care for LLQ abscess drain

## 2021-01-02 NOTE — Plan of Care (Signed)
Problem: Education: Goal: Knowledge of General Education information will improve Description: Including pain rating scale, medication(s)/side effects and non-pharmacologic comfort measures 01/02/2021 1401 by Adam Phenix, RN Outcome: Adequate for Discharge 01/02/2021 0756 by Adam Phenix, RN Outcome: Progressing   Problem: Health Behavior/Discharge Planning: Goal: Ability to manage health-related needs will improve 01/02/2021 1401 by Adam Phenix, RN Outcome: Adequate for Discharge 01/02/2021 0756 by Adam Phenix, RN Outcome: Progressing   Problem: Clinical Measurements: Goal: Ability to maintain clinical measurements within normal limits will improve 01/02/2021 1401 by Adam Phenix, RN Outcome: Adequate for Discharge 01/02/2021 0756 by Adam Phenix, RN Outcome: Progressing Goal: Will remain free from infection 01/02/2021 1401 by Adam Phenix, RN Outcome: Adequate for Discharge 01/02/2021 0756 by Adam Phenix, RN Outcome: Progressing Goal: Diagnostic test results will improve 01/02/2021 1401 by Adam Phenix, RN Outcome: Adequate for Discharge 01/02/2021 0756 by Adam Phenix, RN Outcome: Progressing Goal: Respiratory complications will improve 01/02/2021 1401 by Adam Phenix, RN Outcome: Adequate for Discharge 01/02/2021 0756 by Adam Phenix, RN Outcome: Progressing Goal: Cardiovascular complication will be avoided 01/02/2021 1401 by Adam Phenix, RN Outcome: Adequate for Discharge 01/02/2021 0756 by Adam Phenix, RN Outcome: Progressing   Problem: Activity: Goal: Risk for activity intolerance will decrease 01/02/2021 1401 by Adam Phenix, RN Outcome: Adequate for Discharge 01/02/2021 0756 by Adam Phenix, RN Outcome: Progressing   Problem: Nutrition: Goal: Adequate nutrition will be maintained 01/02/2021 1401 by Adam Phenix, RN Outcome:  Adequate for Discharge 01/02/2021 0756 by Adam Phenix, RN Outcome: Progressing   Problem: Coping: Goal: Level of anxiety will decrease 01/02/2021 1401 by Adam Phenix, RN Outcome: Adequate for Discharge 01/02/2021 0756 by Adam Phenix, RN Outcome: Progressing   Problem: Elimination: Goal: Will not experience complications related to bowel motility 01/02/2021 1401 by Adam Phenix, RN Outcome: Adequate for Discharge 01/02/2021 0756 by Adam Phenix, RN Outcome: Progressing Goal: Will not experience complications related to urinary retention 01/02/2021 1401 by Adam Phenix, RN Outcome: Adequate for Discharge 01/02/2021 0756 by Adam Phenix, RN Outcome: Progressing   Problem: Pain Managment: Goal: General experience of comfort will improve 01/02/2021 1401 by Adam Phenix, RN Outcome: Adequate for Discharge 01/02/2021 0756 by Adam Phenix, RN Outcome: Progressing   Problem: Safety: Goal: Ability to remain free from injury will improve 01/02/2021 1401 by Adam Phenix, RN Outcome: Adequate for Discharge 01/02/2021 0756 by Adam Phenix, RN Outcome: Progressing   Problem: Skin Integrity: Goal: Risk for impaired skin integrity will decrease 01/02/2021 1401 by Adam Phenix, RN Outcome: Adequate for Discharge 01/02/2021 0756 by Adam Phenix, RN Outcome: Progressing   Problem: Education: Goal: Knowledge of General Education information will improve Description: Including pain rating scale, medication(s)/side effects and non-pharmacologic comfort measures 01/02/2021 1401 by Adam Phenix, RN Outcome: Adequate for Discharge 01/02/2021 0756 by Adam Phenix, RN Outcome: Progressing   Problem: Health Behavior/Discharge Planning: Goal: Ability to manage health-related needs will improve 01/02/2021 1401 by Adam Phenix, RN Outcome: Adequate for Discharge 01/02/2021 0756 by  Adam Phenix, RN Outcome: Progressing   Problem: Clinical Measurements: Goal: Ability to maintain clinical measurements within normal limits will improve 01/02/2021 1401 by Adam Phenix, RN Outcome: Adequate for Discharge 01/02/2021 0756 by Adam Phenix, RN Outcome: Progressing Goal: Will remain free from infection 01/02/2021 1401 by Adam Phenix, RN Outcome: Adequate for Discharge 01/02/2021 0756 by  Adam Phenix, RN Outcome: Progressing Goal: Diagnostic test results will improve 01/02/2021 1401 by Adam Phenix, RN Outcome: Adequate for Discharge 01/02/2021 0756 by Adam Phenix, RN Outcome: Progressing Goal: Respiratory complications will improve 01/02/2021 1401 by Adam Phenix, RN Outcome: Adequate for Discharge 01/02/2021 0756 by Adam Phenix, RN Outcome: Progressing Goal: Cardiovascular complication will be avoided 01/02/2021 1401 by Adam Phenix, RN Outcome: Adequate for Discharge 01/02/2021 0756 by Adam Phenix, RN Outcome: Progressing   Problem: Activity: Goal: Risk for activity intolerance will decrease 01/02/2021 1401 by Adam Phenix, RN Outcome: Adequate for Discharge 01/02/2021 0756 by Adam Phenix, RN Outcome: Progressing   Problem: Nutrition: Goal: Adequate nutrition will be maintained 01/02/2021 1401 by Adam Phenix, RN Outcome: Adequate for Discharge 01/02/2021 0756 by Adam Phenix, RN Outcome: Progressing   Problem: Coping: Goal: Level of anxiety will decrease 01/02/2021 1401 by Adam Phenix, RN Outcome: Adequate for Discharge 01/02/2021 0756 by Adam Phenix, RN Outcome: Progressing   Problem: Elimination: Goal: Will not experience complications related to bowel motility 01/02/2021 1401 by Adam Phenix, RN Outcome: Adequate for Discharge 01/02/2021 0756 by Adam Phenix, RN Outcome: Progressing Goal: Will not  experience complications related to urinary retention 01/02/2021 1401 by Adam Phenix, RN Outcome: Adequate for Discharge 01/02/2021 0756 by Adam Phenix, RN Outcome: Progressing   Problem: Pain Managment: Goal: General experience of comfort will improve 01/02/2021 1401 by Adam Phenix, RN Outcome: Adequate for Discharge 01/02/2021 0756 by Adam Phenix, RN Outcome: Progressing   Problem: Safety: Goal: Ability to remain free from injury will improve 01/02/2021 1401 by Adam Phenix, RN Outcome: Adequate for Discharge 01/02/2021 0756 by Adam Phenix, RN Outcome: Progressing   Problem: Skin Integrity: Goal: Risk for impaired skin integrity will decrease 01/02/2021 1401 by Adam Phenix, RN Outcome: Adequate for Discharge 01/02/2021 0756 by Adam Phenix, RN Outcome: Progressing

## 2021-01-02 NOTE — Discharge Instructions (Signed)
Drain care as instructed by RN. Wound care with packing midline wound with saline dampened gauze and pad and paper tape. Ostomy care. PT and RN at home reordered.  No heavy lifting > 10 lbs, excessive bending, pushing, pulling, or squatting for 6-8 weeks after surgery.

## 2021-01-02 NOTE — Progress Notes (Signed)
Patient ambulated in hallway approximately 100 feet with walker. No distress noted.

## 2021-01-02 NOTE — TOC Transition Note (Addendum)
Transition of Care [facility]) - CM/SW Discharge Note  Patient Details  Name: [name] MRN: [id number] Date of Birth: [birth year]  [facility]) CM/SW Contact:  Sherie Don, LCSW Phone Number: 01/02/2021, 12:07 PM  Clinical Narrative: Readmission checklist completed due to high readmission score. CSW spoke with patient to complete assessment and set up discharge services. Per patient, she resides at home with her husband and sister. Current DME includes a walker, but patient expressed need for a 3N1. Patient reported no issues with her medications, but her sister reported issues with limited colostomy supplies. RN to discharge patient with 5 days of colostomy supplies. Patient reported she and her sister provide her transportation to appointments. Patient agreeable to DME referral to [facility].  CSW gave 3N1 referral to [facility] with [facility]. Order has been placed. CSW spoke with Vaughan Basta with [facility] as patient is active with [facility] for [facility]. Per Vaughan Basta, the RN will assist the patient with ordering additional colostomy supplies. HH orders for PT and RN have been placed. TOC signing off.  Addendum: Per Barbaraann Rondo with [facility], patient declined 3N1.  Final next level of care: [place] Barriers to Discharge: Barriers Resolved  Patient Goals and CMS Choice Patient states their goals for this hospitalization and ongoing recovery are:: Discharge home CMS Medicare.gov Compare Post Acute Care list provided to:: Patient Choice offered to / list presented to : Patient  Discharge Plan and Services      DME Arranged: 3-N-1 DME Agency: [facility] Date DME Agency Contacted: 01/02/21 Time DME Agency Contacted: 1202 Representative spoke with at DME Agency: [place]: RN,PT [place] Agency: [place] ([place]) Date [place]: 01/02/21 Time [place]: 1204 Representative spoke with at [place]: [place]  Readmission Risk Interventions Readmission Risk  Prevention Plan 01/02/2021  Transportation Screening Complete  HRI or [place] Complete  Social Work Consult for [place] Planning/Counseling [place] Not Applicable  Medication Review [employer] photographer) Complete  Some recent data might be hidden

## 2021-01-02 NOTE — Plan of Care (Signed)

## 2021-01-02 NOTE — Discharge Summary (Signed)
Physician Discharge Summary  Patient ID: [name] MRN: [id number] DOB/AGE: [birth year] 62 y.o.  Admit date: 12/31/2020 Discharge date: 01/02/2021  Admission Diagnoses: Abscess, intraabdominal   Discharge Diagnoses:  Active Problems:   Abscess, intra-abdominal, postoperative   Discharged Condition: good  Hospital Course: [name] is a 60 yo who has an end colostomy and has a leukocytosis and fluid collection after Ex lap and end colostomy for stercoral ulcer. We had an IR drain placed to ensure that this was not an abscess and cultured. She has improved and her pain is improving.   Consults: IR drain 01/01/21  Significant Diagnostic Studies:  Results for Betty, Garza (MRN [id number]) as of 01/02/2021 12:00  Ref. Range 01/02/2021 07:58  WBC Latest Ref Range: 4.0 - 10.5 K/uL 12.3 (H)  RBC Latest Ref Range: 3.87 - 5.11 MIL/uL 3.37 (L)  Hemoglobin Latest Ref Range: 12.0 - 15.0 g/dL 9.6 (L)  HCT Latest Ref Range: 36.0 - 46.0 % 29.7 (L)  MCV Latest Ref Range: 80.0 - 100.0 fL 88.1  MCH Latest Ref Range: 26.0 - 34.0 pg 28.5  MCHC Latest Ref Range: 30.0 - 36.0 g/dL 32.3  RDW Latest Ref Range: 11.5 - 15.5 % 14.5  Platelets Latest Ref Range: 150 - 400 K/uL 495 (H)  nRBC Latest Ref Range: 0.0 - 0.2 % 0.0  Neutrophils Latest Units: % 66  Lymphocytes Latest Units: % 19  Monocytes Relative Latest Units: % 10  Eosinophil Latest Units: % 3  Basophil Latest Units: % 1  Immature Granulocytes Latest Units: % 1  NEUT# Latest Ref Range: 1.7 - 7.7 K/uL 8.1 (H)  Lymphocyte # Latest Ref Range: 0.7 - 4.0 K/uL 2.3  Monocyte # Latest Ref Range: 0.1 - 1.0 K/uL 1.2 (H)  Eosinophils Absolute Latest Ref Range: 0.0 - 0.5 K/uL 0.3  Basophils Absolute Latest Ref Range: 0.0 - 0.1 K/uL 0.1  Abs Immature Granulocytes Latest Ref Range: 0.00 - 0.07 K/uL 0.16 (H)    Treatments: IV hydration, antibiotics: Zosyn and IR drain   Discharge Exam: Blood pressure (!) 147/84, pulse 86, temperature 99 F  (37.2 C), temperature source Oral, resp. rate 18, height 5\' 4"  (1.626 m), weight 84.4 kg, SpO2 95 %. General appearance: alert, cooperative and no distress Resp: normal work of breathing GI: soft, wound packed, JP drain with serous fluid, end colstomy pink and healthy  Disposition: Discharge disposition: 01-Home or Self Care       Discharge Instructions    Call MD for:  difficulty breathing, headache or visual disturbances   Complete by: As directed    Call MD for:  extreme fatigue   Complete by: As directed    Call MD for:  persistant dizziness or light-headedness   Complete by: As directed    Call MD for:  persistant nausea and vomiting   Complete by: As directed    Call MD for:  severe uncontrolled pain   Complete by: As directed    Call MD for:  temperature >100.4   Complete by: As directed    Diet - low sodium heart healthy   Complete by: As directed    Increase activity slowly   Complete by: As directed      Allergies as of 01/02/2021      Reactions   Nsaids Nausea Only   Abdominal pain      Medication List    TAKE these medications   amitriptyline 25 MG tablet Commonly known as: ELAVIL Take 25 mg by  mouth at bedtime.   amoxicillin-clavulanate 875-125 MG tablet Commonly known as: AUGMENTIN Take 1 tablet by mouth every 12 (twelve) hours for 4 days. What changed: Another medication with the same name was added. Make sure you understand how and when to take each.   amoxicillin-clavulanate 875-125 MG tablet Commonly known as: AUGMENTIN Take 1 tablet by mouth every 12 (twelve) hours for 5 days. What changed: You were already taking a medication with the same name, and this prescription was added. Make sure you understand how and when to take each.   atenolol 50 MG tablet Commonly known as: TENORMIN Take 1 tablet (50 mg total) by mouth daily.   busPIRone 15 MG tablet Commonly known as: BUSPAR Take 1 tablet (15 mg total) by mouth 3 (three) times daily.    citalopram 40 MG tablet Commonly known as: CELEXA Take 1 tablet (40 mg total) by mouth daily with breakfast.   famotidine 20 MG tablet Commonly known as: PEPCID Take 1 tablet (20 mg total) by mouth daily as needed for heartburn or indigestion.   hydrALAZINE 50 MG tablet Commonly known as: APRESOLINE Take 1 tablet (50 mg total) by mouth 2 (two) times daily as needed.   latanoprost 0.005 % ophthalmic solution Commonly known as: XALATAN Place 1 drop into both eyes daily.   lisinopril-hydrochlorothiazide 20-25 MG tablet Commonly known as: ZESTORETIC TAKE 1 TABLET BY MOUTH EVERY DAY   nitroGLYCERIN 0.4 MG SL tablet Commonly known as: NITROSTAT Place 1 tablet (0.4 mg total) under the tongue every 5 (five) minutes as needed for chest pain.   ondansetron 4 MG tablet Commonly known as: ZOFRAN Take 4 mg by mouth every 8 (eight) hours as needed. for nausea   oxyCODONE 5 MG immediate release tablet Commonly known as: Oxy IR/ROXICODONE Take 1 tablet (5 mg total) by mouth every 4 (four) hours as needed for severe pain or breakthrough pain.   Oxycodone HCl 10 MG Tabs Take 1 tablet (10 mg total) by mouth 2 (two) times daily.   rosuvastatin 20 MG tablet Commonly known as: Crestor Take 1 tablet (20 mg total) by mouth daily.   tiZANidine 4 MG tablet Commonly known as: ZANAFLEX Take 4 mg by mouth 3 (three) times daily.   traZODone 100 MG tablet Commonly known as: DESYREL Take 1.5 tablets (150 mg total) by mouth at bedtime as needed for sleep.            Durable Medical Equipment  (From admission, onward)         Start     Ordered   01/02/21 1157  For home use only DME 3 n 1  Once        01/02/21 1157          Follow-up Information    Mir, Paula Libra, MD Follow up.   Specialties: Interventional Radiology, Diagnostic Radiology, Radiology Why: Schedulers will call with date and time of appointment. Contact information: [place]  [address] [phone]        Virl Cagey, MD Follow up on 01/09/2021.   Specialty: General Surgery Contact information: [address] [place] [address] [phone]               Signed: Virl Cagey 01/02/2021, 12:06 PM

## 2021-01-03 ENCOUNTER — Other Ambulatory Visit: Payer: Self-pay

## 2021-01-03 ENCOUNTER — Other Ambulatory Visit: Payer: Self-pay | Admitting: General Surgery

## 2021-01-03 ENCOUNTER — Ambulatory Visit (INDEPENDENT_AMBULATORY_CARE_PROVIDER_SITE_OTHER): Payer: Medicare HMO

## 2021-01-03 DIAGNOSIS — T8143XA Infection following a procedure, organ and space surgical site, initial encounter: Secondary | ICD-10-CM

## 2021-01-04 ENCOUNTER — Encounter: Payer: Self-pay | Admitting: Family Medicine

## 2021-01-04 DIAGNOSIS — R69 Illness, unspecified: Secondary | ICD-10-CM | POA: Diagnosis not present

## 2021-01-04 DIAGNOSIS — Z4801 Encounter for change or removal of surgical wound dressing: Secondary | ICD-10-CM | POA: Diagnosis not present

## 2021-01-04 DIAGNOSIS — Z433 Encounter for attention to colostomy: Secondary | ICD-10-CM | POA: Diagnosis not present

## 2021-01-04 DIAGNOSIS — D689 Coagulation defect, unspecified: Secondary | ICD-10-CM | POA: Diagnosis not present

## 2021-01-04 DIAGNOSIS — M5030 Other cervical disc degeneration, unspecified cervical region: Secondary | ICD-10-CM | POA: Diagnosis not present

## 2021-01-04 DIAGNOSIS — Z48815 Encounter for surgical aftercare following surgery on the digestive system: Secondary | ICD-10-CM | POA: Diagnosis not present

## 2021-01-04 DIAGNOSIS — K658 Other peritonitis: Secondary | ICD-10-CM | POA: Diagnosis not present

## 2021-01-04 DIAGNOSIS — J690 Pneumonitis due to inhalation of food and vomit: Secondary | ICD-10-CM | POA: Diagnosis not present

## 2021-01-04 DIAGNOSIS — I119 Hypertensive heart disease without heart failure: Secondary | ICD-10-CM | POA: Diagnosis not present

## 2021-01-06 LAB — AEROBIC/ANAEROBIC CULTURE W GRAM STAIN (SURGICAL/DEEP WOUND): Culture: NO GROWTH

## 2021-01-07 DIAGNOSIS — K658 Other peritonitis: Secondary | ICD-10-CM | POA: Diagnosis not present

## 2021-01-07 DIAGNOSIS — Z4801 Encounter for change or removal of surgical wound dressing: Secondary | ICD-10-CM | POA: Diagnosis not present

## 2021-01-07 DIAGNOSIS — Z48815 Encounter for surgical aftercare following surgery on the digestive system: Secondary | ICD-10-CM | POA: Diagnosis not present

## 2021-01-07 DIAGNOSIS — R69 Illness, unspecified: Secondary | ICD-10-CM | POA: Diagnosis not present

## 2021-01-07 DIAGNOSIS — M5030 Other cervical disc degeneration, unspecified cervical region: Secondary | ICD-10-CM | POA: Diagnosis not present

## 2021-01-07 DIAGNOSIS — Z433 Encounter for attention to colostomy: Secondary | ICD-10-CM | POA: Diagnosis not present

## 2021-01-07 DIAGNOSIS — D689 Coagulation defect, unspecified: Secondary | ICD-10-CM | POA: Diagnosis not present

## 2021-01-07 DIAGNOSIS — I119 Hypertensive heart disease without heart failure: Secondary | ICD-10-CM | POA: Diagnosis not present

## 2021-01-07 DIAGNOSIS — J690 Pneumonitis due to inhalation of food and vomit: Secondary | ICD-10-CM | POA: Diagnosis not present

## 2021-01-07 LAB — AEROBIC/ANAEROBIC CULTURE W GRAM STAIN (SURGICAL/DEEP WOUND)

## 2021-01-09 ENCOUNTER — Ambulatory Visit (INDEPENDENT_AMBULATORY_CARE_PROVIDER_SITE_OTHER): Payer: Self-pay | Admitting: General Surgery

## 2021-01-09 ENCOUNTER — Encounter: Payer: Self-pay | Admitting: General Surgery

## 2021-01-09 ENCOUNTER — Other Ambulatory Visit: Payer: Self-pay

## 2021-01-09 VITALS — BP 97/67 | HR 80 | Temp 97.6°F | Resp 18 | Ht 64.0 in | Wt 177.0 lb

## 2021-01-09 DIAGNOSIS — Z933 Colostomy status: Secondary | ICD-10-CM | POA: Diagnosis not present

## 2021-01-09 DIAGNOSIS — I10 Essential (primary) hypertension: Secondary | ICD-10-CM | POA: Diagnosis not present

## 2021-01-09 DIAGNOSIS — T8143XA Infection following a procedure, organ and space surgical site, initial encounter: Secondary | ICD-10-CM | POA: Diagnosis not present

## 2021-01-09 NOTE — Patient Instructions (Addendum)
Change dressing twice a day. Let us know if the green/ blue drainage increases or changes. Ostomy care. Drain out with IR next week. Labs for follow up, will call with results.

## 2021-01-09 NOTE — Progress Notes (Signed)
[facility] Note   HPI:  60 y.o. Female presents to clinic for post-op follow-up evaluation after end colostomy for stercoral ulcer. Patient reports she is doing well. She has regular Bms from the ostomy. She is packing her wound. Her IR drain is draining minimal.   Review of Systems:  No fevers or chills Pain improving All other review of systems: otherwise negative   Vital Signs:  BP 97/67   Pulse 80   Temp 97.6 F (36.4 C) (Other (Comment))   Resp 18   Ht 5\' 4"  (1.626 m)   Wt 177 lb (80.3 kg)   SpO2 94%   BMI 30.38 kg/m    Physical Exam:  Physical Exam Cardiovascular:     Rate and Rhythm: Normal rate.  Pulmonary:     Effort: Pulmonary effort is normal.  Abdominal:     General: There is no distension.     Palpations: Abdomen is soft.     Tenderness: There is no abdominal tenderness.     Comments: Midline wound with granulation, some greenish/ blue discharge, minor drainage, no erythema, ostomy with stool and pink and healthy, IR drain with serous fluid  Neurological:     Mental Status: She is alert.      Assessment:  60 y.o. yo Female with healing wound and overall doing well. She is having some odd drainage that could be from the suture material versus a superficial pseudomonas infection. The wound looks good though. Will start with doing BID packing and see if this clears the staining up.   Plan:  Change dressing twice a day. Let us know if the green/ blue drainage increases or changes. Ostomy care. Drain out with IR next week. Labs for follow up, will call with results.   Future Appointments  Date Time Provider [place]  01/15/2021 12:20 PM [facility] 1 [facility]  01/15/2021  1:00 PM [facility] DG 1 ([place]) [facility]  01/15/2021  1:00 PM [facility] IR [facility]  01/23/2021  2:00 PM Virl Cagey, MD [facility] None  09/05/2021  1:15 PM [name], DO [facility] None  09/22/2021  1:15 PM [facility]NUAL  WELLNESS VISIT [facility] None     All of the above recommendations were discussed with the patient and patient's family, and all of patient's and family's questions were answered to their expressed satisfaction.  [name], MD [facility] [address] [place], [place] [address] [phone] (office)

## 2021-01-10 ENCOUNTER — Other Ambulatory Visit: Payer: Self-pay | Admitting: Family Medicine

## 2021-01-10 ENCOUNTER — Telehealth (INDEPENDENT_AMBULATORY_CARE_PROVIDER_SITE_OTHER): Payer: Medicare HMO | Admitting: General Surgery

## 2021-01-10 DIAGNOSIS — T8143XA Infection following a procedure, organ and space surgical site, initial encounter: Secondary | ICD-10-CM

## 2021-01-10 DIAGNOSIS — K658 Other peritonitis: Secondary | ICD-10-CM | POA: Diagnosis not present

## 2021-01-10 DIAGNOSIS — R69 Illness, unspecified: Secondary | ICD-10-CM | POA: Diagnosis not present

## 2021-01-10 DIAGNOSIS — Z933 Colostomy status: Secondary | ICD-10-CM

## 2021-01-10 DIAGNOSIS — Z433 Encounter for attention to colostomy: Secondary | ICD-10-CM | POA: Diagnosis not present

## 2021-01-10 DIAGNOSIS — Z48815 Encounter for surgical aftercare following surgery on the digestive system: Secondary | ICD-10-CM | POA: Diagnosis not present

## 2021-01-10 DIAGNOSIS — J690 Pneumonitis due to inhalation of food and vomit: Secondary | ICD-10-CM | POA: Diagnosis not present

## 2021-01-10 DIAGNOSIS — M5030 Other cervical disc degeneration, unspecified cervical region: Secondary | ICD-10-CM | POA: Diagnosis not present

## 2021-01-10 DIAGNOSIS — D689 Coagulation defect, unspecified: Secondary | ICD-10-CM | POA: Diagnosis not present

## 2021-01-10 DIAGNOSIS — I119 Hypertensive heart disease without heart failure: Secondary | ICD-10-CM | POA: Diagnosis not present

## 2021-01-10 DIAGNOSIS — Z4801 Encounter for change or removal of surgical wound dressing: Secondary | ICD-10-CM | POA: Diagnosis not present

## 2021-01-10 LAB — BASIC METABOLIC PANEL WITH GFR
BUN/Creatinine Ratio: 13 (calc) (ref 6–22)
BUN: 14 mg/dL (ref 7–25)
CO2: 35 mmol/L — ABNORMAL HIGH (ref 20–32)
Calcium: 9.2 mg/dL (ref 8.6–10.4)
Chloride: 98 mmol/L (ref 98–110)
Creat: 1.06 mg/dL — ABNORMAL HIGH (ref 0.50–0.99)
GFR, Est African American: 66 mL/min/{1.73_m2} (ref >=60)
GFR, Est Non African American: 57 mL/min/{1.73_m2} — ABNORMAL LOW (ref >=60)
Glucose, Bld: 91 mg/dL (ref 65–99)
Potassium: 4.5 mmol/L (ref 3.5–5.3)
Sodium: 139 mmol/L (ref 135–146)

## 2021-01-10 LAB — CBC WITH DIFFERENTIAL/PLATELET
Absolute Monocytes: 1323 cells/uL — ABNORMAL HIGH (ref 200–950)
Basophils Absolute: 38 cells/uL (ref 0–200)
Basophils Relative: 0.3 %
Eosinophils Absolute: 781 cells/uL — ABNORMAL HIGH (ref 15–500)
Eosinophils Relative: 6.2 %
HCT: 33.6 % — ABNORMAL LOW (ref 35.0–45.0)
Hemoglobin: 11.1 g/dL — ABNORMAL LOW (ref 11.7–15.5)
Lymphs Abs: 3112 cells/uL (ref 850–3900)
MCH: 28.5 pg (ref 27.0–33.0)
MCHC: 33 g/dL (ref 32.0–36.0)
MCV: 86.4 fL (ref 80.0–100.0)
MPV: 9.6 fL (ref 7.5–12.5)
Monocytes Relative: 10.5 %
Neutro Abs: 7346 cells/uL (ref 1500–7800)
Neutrophils Relative %: 58.3 %
Platelets: 503 10*3/uL — ABNORMAL HIGH (ref 140–400)
RBC: 3.89 10*6/uL (ref 3.80–5.10)
RDW: 13.7 % (ref 11.0–15.0)
Total Lymphocyte: 24.7 %
WBC: 12.6 10*3/uL — ABNORMAL HIGH (ref 3.8–10.8)

## 2021-01-10 NOTE — Telephone Encounter (Signed)
[facility]  Results for [name], [name] (MRN [id number]) as of 01/10/2021 15:34  Ref. Range 01/09/2021 14:43  Sodium Latest Ref Range: 135 - 146 mmol/L 139  Potassium Latest Ref Range: 3.5 - 5.3 mmol/L 4.5  Chloride Latest Ref Range: 98 - 110 mmol/L 98  CO2 Latest Ref Range: 20 - 32 mmol/L 35 (H)  Glucose Latest Ref Range: 65 - 99 mg/dL 91  BUN Latest Ref Range: 7 - 25 mg/dL 14  Creatinine Latest Ref Range: 0.50 - 0.99 mg/dL 1.06 (H)  Calcium Latest Ref Range: 8.6 - 10.4 mg/dL 9.2  BUN/Creatinine Ratio Latest Ref Range: 6 - 22 (calc) 13  GFR, Est Non African American Latest Ref Range: > OR = 60 mL/min/1.99m2 57 (L)  GFR, Est African American Latest Ref Range: > OR = 60 mL/min/1.99m2 66  WBC Latest Ref Range: 3.8 - 10.8 Thousand/uL 12.6 (H)  RBC Latest Ref Range: 3.80 - 5.10 Million/uL 3.89  Hemoglobin Latest Ref Range: 11.7 - 15.5 g/dL 11.1 (L)  HCT Latest Ref Range: 35.0 - 45.0 % 33.6 (L)  MCV Latest Ref Range: 80.0 - 100.0 fL 86.4  MCH Latest Ref Range: 27.0 - 33.0 pg 28.5  MCHC Latest Ref Range: 32.0 - 36.0 g/dL 33.0  RDW Latest Ref Range: 11.0 - 15.0 % 13.7  Platelets Latest Ref Range: 140 - 400 Thousand/uL 503 (H)  MPV Latest Ref Range: 7.5 - 12.5 fL 9.6  Neutrophils Latest Units: % 58.3  Monocytes Relative Latest Units: % 10.5  Eosinophil Latest Units: % 6.2  Basophil Latest Units: % 0.3  NEUT# Latest Ref Range: 1,500 - 7,800 cells/uL 7,346  Lymphocyte # Latest Ref Range: 850 - 3,900 cells/uL 3,112  Total Lymphocyte Latest Units: % 24.7  Eosinophils Absolute Latest Ref Range: 15 - 500 cells/uL 781 (H)  Basophils Absolute Latest Ref Range: 0 - 200 cells/uL 38  Absolute Monocytes Latest Ref Range: 200 - 950 cells/uL 1,323 (H)    H&H improved. WBC is still up but likely some degree of inflammation, wound look good overall, had some staining but felt like that was from the suture versus some pseudomonas in the wound,  I do not think we need to start any  antibiotics right now, drain looked good. No fevers. Cr up a little and patient is drinking and is not dehydrated. No urinary symptoms.   Plan to do wound care twice a day to help with the drainage.  IR drain to come out next week also. Will give Dr. Lajuana Ripple update so that she could may follow up labs next week so patient does not have to come to [place] next week.   Discussed all of the above with her sister, Helene Kelp who is a Therapist, sports and is helping the patient.   [name], MD [facility] [address] [place], [place] [address] [phone] (office)

## 2021-01-11 ENCOUNTER — Telehealth (INDEPENDENT_AMBULATORY_CARE_PROVIDER_SITE_OTHER): Payer: Medicare HMO | Admitting: General Surgery

## 2021-01-11 DIAGNOSIS — Z933 Colostomy status: Secondary | ICD-10-CM

## 2021-01-11 NOTE — Telephone Encounter (Signed)
[facility]  Patient knows to go next week ~ Thursday to get labs, will be 1 week from previous. Will got to Dr. Marjean Donna office.   [name], MD [facility] [address] [place],  [address] [phone] (office)

## 2021-01-13 DIAGNOSIS — K658 Other peritonitis: Secondary | ICD-10-CM | POA: Diagnosis not present

## 2021-01-13 DIAGNOSIS — J690 Pneumonitis due to inhalation of food and vomit: Secondary | ICD-10-CM | POA: Diagnosis not present

## 2021-01-13 DIAGNOSIS — D689 Coagulation defect, unspecified: Secondary | ICD-10-CM | POA: Diagnosis not present

## 2021-01-13 DIAGNOSIS — R69 Illness, unspecified: Secondary | ICD-10-CM | POA: Diagnosis not present

## 2021-01-13 DIAGNOSIS — M5030 Other cervical disc degeneration, unspecified cervical region: Secondary | ICD-10-CM | POA: Diagnosis not present

## 2021-01-13 DIAGNOSIS — Z48815 Encounter for surgical aftercare following surgery on the digestive system: Secondary | ICD-10-CM | POA: Diagnosis not present

## 2021-01-13 DIAGNOSIS — Z933 Colostomy status: Secondary | ICD-10-CM | POA: Diagnosis not present

## 2021-01-13 DIAGNOSIS — I119 Hypertensive heart disease without heart failure: Secondary | ICD-10-CM | POA: Diagnosis not present

## 2021-01-13 DIAGNOSIS — Z433 Encounter for attention to colostomy: Secondary | ICD-10-CM | POA: Diagnosis not present

## 2021-01-13 DIAGNOSIS — Z4801 Encounter for change or removal of surgical wound dressing: Secondary | ICD-10-CM | POA: Diagnosis not present

## 2021-01-14 ENCOUNTER — Encounter: Payer: Self-pay | Admitting: Family Medicine

## 2021-01-14 NOTE — Telephone Encounter (Signed)
Noted  

## 2021-01-15 ENCOUNTER — Ambulatory Visit
Admission: RE | Admit: 2021-01-15 | Discharge: 2021-01-15 | Disposition: A | Discharge status: Home / Self Care | Payer: Medicare HMO | Source: Ambulatory Visit | Attending: General Surgery | Admitting: General Surgery

## 2021-01-15 ENCOUNTER — Ambulatory Visit
Admission: RE | Admit: 2021-01-15 | Discharge: 2021-01-15 | Disposition: A | Discharge status: Home / Self Care | Payer: Medicare HMO | Source: Ambulatory Visit | Attending: Radiology | Admitting: Radiology

## 2021-01-15 DIAGNOSIS — T8143XA Infection following a procedure, organ and space surgical site, initial encounter: Secondary | ICD-10-CM

## 2021-01-15 DIAGNOSIS — M4322 Fusion of spine, cervical region: Secondary | ICD-10-CM | POA: Diagnosis not present

## 2021-01-15 DIAGNOSIS — Z872 Personal history of diseases of the skin and subcutaneous tissue: Secondary | ICD-10-CM | POA: Diagnosis not present

## 2021-01-15 DIAGNOSIS — K429 Umbilical hernia without obstruction or gangrene: Secondary | ICD-10-CM | POA: Diagnosis not present

## 2021-01-15 DIAGNOSIS — Z4682 Encounter for fitting and adjustment of non-vascular catheter: Secondary | ICD-10-CM | POA: Diagnosis not present

## 2021-01-15 DIAGNOSIS — K551 Chronic vascular disorders of intestine: Secondary | ICD-10-CM | POA: Diagnosis not present

## 2021-01-15 DIAGNOSIS — M542 Cervicalgia: Secondary | ICD-10-CM | POA: Diagnosis not present

## 2021-01-15 DIAGNOSIS — G894 Chronic pain syndrome: Secondary | ICD-10-CM | POA: Diagnosis not present

## 2021-01-15 DIAGNOSIS — K651 Peritoneal abscess: Secondary | ICD-10-CM | POA: Diagnosis not present

## 2021-01-15 HISTORY — PX: IR RADIOLOGIST EVAL & MGMT: IMG5224

## 2021-01-15 MED ORDER — IOPAMIDOL (ISOVUE-300) INJECTION 61%
100.0000 mL | Freq: Once | INTRAVENOUS | Status: AC | PRN
  Administered 2021-01-15: 100 mL via INTRAVENOUS

## 2021-01-15 NOTE — Consult Note (Signed)
Chief Complaint: LLQ abscess drain  Referring Physician(s): Turpin,Pamela  Patient Status: [facility] - Out-pt  History of Present Illness: [name] is a 60 year old woman with history perforated colonic ulcer an abdominal abscess status post drain placement on 01/01/2021 returns for follow-up imaging and possible drain removal.  She has minimal output from the drain and feels much better since being discharged from the hospital.   Past Medical History:  Diagnosis Date  . Abdominal pain   . Anxiety   . Arthritis   . Blood in stool   . Breast changes, fibrocystic   . Bruises easily   . Cardiomegaly 2018  . Clotting disorder (HCC)     itp , none since 1976, no current hematologist  . Constipation   . DDD (degenerative disc disease), cervical    with lumbar issues  . Depression   . Generalized headaches   . GERD (gastroesophageal reflux disease)   . Hemorrhoid   . Hyperlipidemia   . Hypertension   . Idiopathic thrombocytopenia ([place])    as child  . Leg swelling    both ankles  . Liver lesion   . Nasal congestion   . Nausea & vomiting   . Neuromuscular disorder ([place])    back  injury  . Neuromuscular disorder ([place])    3 neck surgeries,neck fusion.pin,plates,screws  . Osteoporosis   . Panic attacks    pt also  has germaphobia  . Rectal bleeding   . Seizures ([place]) 1998   stress induced, one time, none since, no seizure meds  . Seizures ([place])    Stated she had 2 weeks ago,it was like a transient,stare.  . Sleep apnea    STOPBANG=4  . Spleen enlarged 1976, none since   r/t idiopathic thrombocytopenia  . Trouble swallowing   . Unintentional weight loss   . Weakness    weakness varies    Past Surgical History:  Procedure Laterality Date  . ABDOMINAL HYSTERECTOMY  1997  . BACK SURGERY    . CERVICAL SPINE SURGERY  2005, 2007, 2010   x3, fusion done,plates and screws present  . [place]  . CHOLECYSTECTOMY  03/14/2012   Procedure:  LAPAROSCOPIC CHOLECYSTECTOMY;  Surgeon: Stark Klein, MD;  Location: [facility] ORS;  Service: General;  Laterality: N/A;  . COLECTOMY WITH COLOSTOMY CREATION/HARTMANN PROCEDURE N/A 12/21/2020   Procedure: COLECTOMY WITH COLOSTOMY CREATION/HARTMANN PROCEDURE;  Surgeon: Virl Cagey, MD;  Location: [facility] ORS;  Service: General;  Laterality: N/A;  . colonscopy and esophagogastrodudononescopy  02-04-12  . IR RADIOLOGIST EVAL & MGMT  01/15/2021  . LUMBAR DISC SURGERY  11/2014    Allergies: Nsaids  Medications: Prior to Admission medications   Medication Sig Start Date End Date Taking? Authorizing Provider  amitriptyline (ELAVIL) 25 MG tablet Take 25 mg by mouth at bedtime. 08/04/19   [provider]  atenolol (TENORMIN) 50 MG tablet Take 1 tablet (50 mg total) by mouth daily. 09/06/20   [name], DO  busPIRone (BUSPAR) 15 MG tablet Take 1 tablet (15 mg total) by mouth 3 (three) times daily. 09/06/20   [name], DO  citalopram (CELEXA) 40 MG tablet Take 1 tablet (40 mg total) by mouth daily with breakfast. 09/06/20   Ronnie Doss M, DO  famotidine (PEPCID) 20 MG tablet Take 1 tablet (20 mg total) by mouth daily as needed for heartburn or indigestion. 08/16/20   Milus Banister, MD  hydrALAZINE (APRESOLINE) 50 MG tablet Take 1 tablet (  50 mg total) by mouth 2 (two) times daily as needed. Patient not taking: No sig reported 05/20/20 08/18/20  Verta Ellen., NP  latanoprost (XALATAN) 0.005 % ophthalmic solution Place 1 drop into both eyes daily. 12/13/20   [provider]  lisinopril-hydrochlorothiazide (ZESTORETIC) 20-25 MG tablet TAKE 1 TABLET BY MOUTH EVERY DAY Patient taking differently: Take 1 tablet by mouth daily. 10/18/20   Arnoldo Lenis, MD  nitroGLYCERIN (NITROSTAT) 0.4 MG SL tablet Place 1 tablet (0.4 mg total) under the tongue every 5 (five) minutes as needed for chest pain. 05/30/20 08/28/20  Ronnie Doss M, DO  ondansetron (ZOFRAN) 4 MG  tablet Take 4 mg by mouth every 8 (eight) hours as needed. for nausea 07/29/19   [provider]  oxyCODONE (OXY IR/ROXICODONE) 5 MG immediate release tablet Take 1 tablet (5 mg total) by mouth every 4 (four) hours as needed for severe pain or breakthrough pain. Patient not taking: Reported on 01/09/2021 12/27/20   Virl Cagey, MD  Oxycodone HCl 10 MG TABS Take 1 tablet (10 mg total) by mouth 2 (two) times daily. 12/28/20   Lysle Pearl, Isami, DO  rosuvastatin (CRESTOR) 20 MG tablet Take 1 tablet (20 mg total) by mouth daily. 06/03/20   [name], DO  tiZANidine (ZANAFLEX) 4 MG tablet Take 4 mg by mouth 3 (three) times daily. 10/17/20   [provider]  traZODone (DESYREL) 100 MG tablet Take 1.5 tablets (150 mg total) by mouth at bedtime as needed for sleep. 05/30/20   Ronnie Doss M, DO  hydrochlorothiazide (HYDRODIURIL) 25 MG tablet Take 25 mg by mouth daily.  05/20/20  [provider]     Family History  Problem Relation Age of Onset  . Hypertension Mother   . Heart disease Mother   . Lung cancer Father   . Cancer Father        lung  . Colon cancer Paternal Grandmother   . Cancer Paternal Grandmother        colon  . Heart disease Maternal Grandmother   . Diabetes Sister   . Bipolar disorder Sister   . Heart disease Sister        Congestive Heart Failure  . Hypertension Sister     Social History   Socioeconomic History  . Marital status: Married    Spouse name: Legrand Como  . Number of children: 3  . Years of education: 85  . Highest education level: Associate degree: academic program  Occupational History  . Occupation: Diabled    Employer: UNEMPLOYED  Tobacco Use  . Smoking status: Former Smoker    Packs/day: 1.00    Years: 12.00    Pack years: 12.00    Types: Cigarettes    Start date: 12/20/2005    Quit date: 09/20/2020    Years since quitting: 0.3  . Smokeless tobacco: Never Used  Vaping Use  . Vaping Use: Former  . Quit date:  09/07/2016  . Devices: only used for about a month  Substance and Sexual Activity  . Alcohol use: No    Alcohol/week: 0.0 standard drinks  . Drug use: Yes    Types: Marijuana  . Sexual activity: Yes  Other Topics Concern  . Not on file  Social History Narrative   She lives with her boyfriend.  She used to smoke, quit May 2013, she is not drinking. She has 3 children   Social Determinants of Radio broadcast assistant Strain: Not on [employer]  Insecurity: Not on file  Transportation Needs: Not on file  Physical Activity: Not on file  Stress: Not on file  Social Connections: Not on file    Review of Systems: A 12 point ROS discussed and pertinent positives are indicated in the HPI above.  All other systems are negative.  Review of Systems  Vital Signs: BP (!) 146/78   Pulse 91   Temp 98.3 F (36.8 C)   No physical exam was performed.  Imaging: CT ABDOMEN PELVIS W CONTRAST  Result Date: 01/15/2021 CLINICAL DATA:  60 year old woman with history perforated colonic ulcer an abdominal abscess status post drain placement on 01/01/2021 returns for follow-up imaging. EXAM: CT ABDOMEN AND PELVIS WITH CONTRAST TECHNIQUE: Multidetector CT imaging of the abdomen and pelvis was performed using the standard protocol following bolus administration of intravenous contrast. CONTRAST:  146mL ISOVUE-300 IOPAMIDOL (ISOVUE-300) INJECTION 61% COMPARISON:  12/31/2020 FINDINGS: Lower chest: Minimal residual right pleural effusion is improved since prior study. Minimal bibasilar atelectasis, improved since prior study. Hepatobiliary: Status post cholecystectomy. Minimal dilatation of the intra and extrahepatic bile ducts likely due to post cholecystectomy status. Pancreas: Unremarkable. No pancreatic ductal dilatation or surrounding inflammatory changes. Spleen: Normal in size without focal abnormality. Adrenals/Urinary Tract: Adrenal glands are normal. Subcentimeter right renal hypodensity is too small  to fully characterize. Kidneys, ureters, and bladder are otherwise unremarkable. Stomach/Bowel: No significant abnormality of stomach or small bowel. Excluded excluded Hartmann pouch again seen. No dilatation of bowel to indicate ileus or obstruction. Appendix is normal. Interval resolution of previously seen left lower quadrant abscess. Vascular/Lymphatic: No enlarged lymph nodes. Moderate stenosis at the origin of the superior mesenteric artery Reproductive: Status post hysterectomy. No adnexal masses. Other: Left lower quadrant colostomy is seen. Small fat containing umbilical hernia. Left lower quadrant drain unchanged in position. Musculoskeletal: Laminectomy and posterior fusion changes seen at L4-S1. IMPRESSION: Interval resolution of left lower quadrant fluid collection. Drain in unchanged position. Electronically Signed   By: Miachel Roux M.D.   On: 01/15/2021 16:18   CT ABDOMEN PELVIS W CONTRAST  Result Date: 12/31/2020 CLINICAL DATA:  60 year old female with severe abdominal pain status post 12/21/2020 sigmoid resection, Hartmann's procedure with end colostomy for stercoral ulcer of sigmoid colon, fecal peritonitis. EXAM: CT ABDOMEN AND PELVIS WITH CONTRAST TECHNIQUE: Multidetector CT imaging of the abdomen and pelvis was performed using the standard protocol following bolus administration of intravenous contrast. CONTRAST:  141mL OMNIPAQUE IOHEXOL 300 MG/ML  SOLN COMPARISON:  Postoperative CT 12/26/2020 and earlier. FINDINGS: Lower chest: Continued small layering pleural effusions. That on the right appears mildly increased. But bilateral lower lobe enhancing atelectasis has not significantly changed. No pericardial effusion. Hepatobiliary: Chronically absent gallbladder. Stable liver and bile ducts. Pancreas: Stable pancreatic atrophy. Spleen: Trace subcapsular splenic fluid suspected now on series 2, image 17, stable from the prior CT. Small peripheral splenic infarct is felt less likely. Elsewhere  splenic enhancement maintained. Adrenals/Urinary Tract: Negative kidneys and ureters. Normal adrenal glands. Ureters appear symmetric and within normal limits on delayed excretory images. Unremarkable urinary bladder. Stomach/Bowel: Midline ventral abdominal wound healing by secondary intention appears satisfactory. Descending colostomy again noted. Blind-ending distal large bowel with staple line on series 2, image 67 at the pelvic inlet. Persistent small volume of extraluminal fluid adjacent to the staple line (series 2, image 67) is stable. But there is increased left abdominal fluid in the mesentery from the left upper quadrant to the left lower quadrant. Simple fluid density, but mild peritoneal thickening. See coronal  image 49. The most confluent such area of fluid is in the left ventral lower quadrant encompassing 32 x 44 x 41 mm (AP by transverse by CC), volume estimated at 30 mL. Trace residual retroperitoneal air near the right adrenal gland. No free intraperitoneal air identified. Only trace fluid in the right gutter. Decompressed stomach and duodenum. No dilated small bowel. Small bowel loops in the midline lower abdomen demonstrates mucosal hyperenhancement. There is retained fluid and liquid stool in the large bowel to the splenic flexure. Decompressed descending colostomy. Vascular/Lymphatic: Major arterial structures in the abdomen and pelvis remain patent. Portal venous system is patent. No lymphadenopathy. Reproductive: Appears to be surgically absent as before. Other: Regressed anasarca. Trace pelvic free fluid and presacral stranding have regressed. Musculoskeletal: Chronic lower lumbar decompression and fusion. Stable visualized osseous structures. IMPRESSION: 1. Organizing mesenteric fluid in the left abdomen associated with some peritoneal thickening, suspicious for developing abscess. The most confluent such fluid is in the left lower quadrant measures 32 x 44 mm (estimated at 30 mL). See  coronal image 48. 2. Otherwise stable/satisfactory postoperative appearance of the abdomen. Descending colostomy with no evidence of bowel obstruction. Although there is retained fluid and/or liquid stool in the proximal colon. Trace residual retroperitoneal gas. 3. Small pleural effusions and lower lobe atelectasis not significantly changed. Regressed anasarca since the CT on 12/26/2020. Electronically Signed   By: Genevie Ann M.D.   On: 12/31/2020 06:55   CT ABDOMEN PELVIS W CONTRAST  Result Date: 12/26/2020 CLINICAL DATA:  Persistent mid to low abdominal pain, abscess or infection suspected, status post colon perforation and resection EXAM: CT ABDOMEN AND PELVIS WITH CONTRAST TECHNIQUE: Multidetector CT imaging of the abdomen and pelvis was performed using the standard protocol following bolus administration of intravenous contrast. CONTRAST:  147mL OMNIPAQUE IOHEXOL 300 MG/ML SOLN, additional oral enteric contrast COMPARISON:  12/21/2020 FINDINGS: Lower chest: Small bilateral pleural effusions associated atelectasis or consolidation. Hepatobiliary: No focal liver abnormality is seen. Status post cholecystectomy. Postop biliary dilatation. Pancreas: Unremarkable. No pancreatic ductal dilatation or surrounding inflammatory changes. Spleen: Normal in size without significant abnormality. Adrenals/Urinary Tract: Adrenal glands are unremarkable. Kidneys are normal, without renal calculi, solid lesion, or hydronephrosis. Bladder is unremarkable. Stomach/Bowel: Stomach is within normal limits. There are contrast and air filled, although not overly distended loops of mid to distal small bowel in the mid abdomen, largest caliber 3.6 cm (series 2, image 48). Appendix appears normal. Status post interval sigmoid colon resection and left lower quadrant end ostomy. There are scattered, tiny air loculations persisting in the right lower quadrant (series 2, image 64). Vascular/Lymphatic: Aortic atherosclerosis. No enlarged  abdominal or pelvic lymph nodes. Reproductive: No mass or other significant abnormality. Other: Low midline laparotomy with open, dressed wound. New small volume ascites throughout the abdomen and pelvis. Anasarca. Musculoskeletal: No acute or significant osseous findings. IMPRESSION: 1. Status post interval sigmoid colon resection and left lower quadrant end ostomy. There are scattered, tiny air loculations persisting in the right lower quadrant in keeping with perforation noted on prior examination. No evidence of discrete intra-abdominal fluid collection or other postoperative complication. 2. There are contrast and air filled, although not overly distended loops of mid to distal small bowel in the mid abdomen, largest caliber 3.6 cm. Findings are most consistent with postoperative ileus. There is gas and stool present to the ostomy 3. New small volume ascites throughout the abdomen and pelvis. Anasarca. 4. New small bilateral pleural effusions and associated atelectasis or consolidation. Aortic Atherosclerosis (  ICD10-I70.0). Electronically Signed   By: Eddie Candle M.D.   On: 12/26/2020 15:00   CT ABDOMEN PELVIS W CONTRAST  Result Date: 12/21/2020 CLINICAL DATA:  Abdominal distension, bowel obstruction suspected EXAM: CT ABDOMEN AND PELVIS WITH CONTRAST TECHNIQUE: Multidetector CT imaging of the abdomen and pelvis was performed using the standard protocol following bolus administration of intravenous contrast. CONTRAST:  134mL OMNIPAQUE IOHEXOL 300 MG/ML  SOLN COMPARISON:  11/15/2015 FINDINGS: Lower chest: No acute abnormality. Hepatobiliary: No focal liver abnormality is seen. Status post cholecystectomy. Mild postoperative biliary dilatation. Pancreas: Unremarkable. No pancreatic ductal dilatation or surrounding inflammatory changes. Spleen: Normal in size without significant abnormality. Adrenals/Urinary Tract: Adrenal glands are unremarkable. Kidneys are normal, without renal calculi, solid lesion, or  hydronephrosis. Bladder is unremarkable. Stomach/Bowel: The stomach and lower esophagus are fluid-filled. Appendix appears normal in a retrocecal lie. There is a moderate burden of stool throughout the colon, with a large stool ball in the distal sigmoid colon and adjacent extraluminal air (series 2, image 68). Vascular/Lymphatic: Aortic atherosclerosis. No enlarged abdominal or pelvic lymph nodes. Reproductive: Status post hysterectomy. Other: No abdominal wall hernia or abnormality. Small volume pneumoperitoneum and retroperitoneal emphysema. Trace fluid in the right lower quadrant. Musculoskeletal: No acute or significant osseous findings. IMPRESSION: 1. There is a moderate burden of stool throughout the colon, with a large stool ball in the distal sigmoid colon and adjacent extraluminal air throughout the mesentery and retroperitoneum. Findings are consistent with bowel perforation, likely secondary to stercoral ulceration. An obstipating mass of the distal sigmoid would be difficult to exclude and could be further investigated by colonoscopy. 2. Small volume pneumoperitoneum and fluid in the right lower quadrant. These results were called by telephone at the time of interpretation on 12/21/2020 at 6:14 pm to [facility] , who verbally acknowledged these results. Aortic Atherosclerosis (ICD10-I70.0). Electronically Signed   By: Eddie Candle M.D.   On: 12/21/2020 18:14   DG Sinus/Fist Tube Chk-Non GI  Result Date: 01/15/2021 INDICATION: 60 year old woman status post left lower quadrant abscess drain placement on 01/01/2021 returns for abscessogram and possible removal. EXAM: Abscessogram and drain removal MEDICATIONS: None COMPLICATIONS: None immediate. PROCEDURE: Informed written consent was obtained from the patient after a thorough discussion of the procedural risks, benefits and alternatives. All questions were addressed. Maximal Sterile Barrier Technique was utilized including caps, mask, sterile  gowns, sterile gloves, sterile drape, hand hygiene and skin antiseptic. A timeout was performed prior to the initiation of the procedure. Scout image demonstrated the left lower quadrant drain in unchanged position. Contrast administered through the drain demonstrated no significant residual cavity. No fistulous communication with bowel identified. Drain cut and removed in its entirety. Insert site covered with sterile dressing. IMPRESSION: Left lower quadrant abscessogram demonstrates no significant residual cavity. Drain removed. Electronically Signed   By: Miachel Roux M.D.   On: 01/15/2021 16:19   DG CHEST PORT 1 VIEW  Result Date: 12/23/2020 CLINICAL DATA:  Dyspnea. EXAM: PORTABLE CHEST 1 VIEW COMPARISON:  December 22, 2020. FINDINGS: Endotracheal tube tip approximately 13 mm above the carina. Right IJ central venous catheter tip projecting at the right atrium. Gastric tube courses below the diaphragm outside the field of view. Free air seen on prior radiograph and on the prior CT is less conspicuous on this exam. Similar streaky bibasilar opacities with low lung volumes. No new confluent consolidation. No visible pleural effusions or pneumothorax. Similar cardiomediastinal silhouette. Aortic atherosclerosis. Partially imaged ACDF. IMPRESSION: 1. Endotracheal tube tip approximately 13 mm  above the carina. Retraction by approximately 2.5 cm would place the tip at the level of the clavicular heads. 2. Similar streaky bibasilar opacities, likely atelectasis. 3. Free air seen on prior radiograph and on the prior CT is less conspicuous on this exam. Electronically Signed   By: Margaretha Sheffield MD   On: 12/23/2020 07:50   DG Chest Port 1 View  Result Date: 12/22/2020 CLINICAL DATA:  Endotracheal tube placement. EXAM: PORTABLE CHEST 1 VIEW COMPARISON:  Chest radiograph 06/06/2020, included lung bases from abdominal CT earlier today. FINDINGS: Endotracheal tube tip 13 mm from the carina. Tip and side port of  the enteric tube below the diaphragm in the stomach. Right central line tip in the right atrium. Heart is normal in size. Curvilinear lucency abutting the right heart border corresponds to free air under the hemidiaphragm. Bibasilar atelectasis, increased. No pneumothorax. No significant pleural effusion. IMPRESSION: 1. Endotracheal tube tip 13 mm from the carina. Enteric tube tip and side port below the diaphragm in the stomach. 2. Right central line tip in the right atrium. 3. Increased bibasilar atelectasis. 4. Known free air in the upper abdomen characterized on CT. Electronically Signed   By: Keith Rake M.D.   On: 12/22/2020 00:11   CT IMAGE GUIDED FLUID DRAIN BY CATHETER  Result Date: 01/01/2021 INDICATION: 60 year old woman with perforated colonic ulcer status post end colostomy on 12/21/2020 has persistent leukocytosis and intra-abdominal fluid collection. Interventional radiology consulted for drain placement. EXAM: CT-guided left lower quadrant abscess drain placement. MEDICATIONS: The patient is currently admitted to the hospital and receiving intravenous antibiotics. The antibiotics were administered within an appropriate time frame prior to the initiation of the procedure. ANESTHESIA/SEDATION: Fentanyl 150 mcg IV; Versed 2 mg IV Moderate Sedation Time:  35 minutes The patient was continuously monitored during the procedure by the interventional radiology nurse under my direct supervision. COMPLICATIONS: None immediate. PROCEDURE: Informed written consent was obtained from the patient after a thorough discussion of the procedural risks, benefits and alternatives. All questions were addressed. Maximal Sterile Barrier Technique was utilized including caps, mask, sterile gowns, sterile gloves, sterile drape, hand hygiene and skin antiseptic. A timeout was performed prior to the initiation of the procedure. Patient positioned supine on the procedure table. Contrast enhanced CT was performed to  better delineate bowel from fluid collection. The left lower quadrant skin prepped and draped in usual fashion. Following local lidocaine administration, 17 gauge needle advanced into the largest pocket of left lower quadrant intraperitoneal fluid utilizing CT guidance. The 17 gauge needle was exchanged for 10.2 [place] multipurpose pigtail drain over 0.035 inch guidewire. Drain secured to skin with suture and connected to bulb. 10 mL sample was sent for Gram stain and culture. IMPRESSION: 10.2 [place] multipurpose pigtail drain placed in left lower quadrant fluid collection. PLAN: Consider repeat CT imaging once output is less than 10 mL per day (excluding flush volumes) for 2 consecutive days. If discharged home with drain, please follow up with IR drain clinic [phone]) at the discretion of ordering clinician. Electronically Signed   By: Miachel Roux M.D.   On: 01/01/2021 16:12   IR Radiologist Eval & Mgmt  Result Date: 01/15/2021 Please refer to notes tab for details about interventional procedure. (Op Note)   Labs:  CBC: Recent Labs    12/31/20 0500 01/01/21 0509 01/02/21 0758 01/09/21 1443  WBC 18.0* 13.0* 12.3* 12.6*  HGB 10.6* 9.7* 9.6* 11.1*  HCT 32.3* 30.4* 29.7* 33.6*  PLT 444* 432* 495* 503*  COAGS: Recent Labs    12/31/20 0500  INR 1.2    BMP: Recent Labs    01/29/20 1149 05/30/20 1645 12/21/20 1650 12/27/20 0545 12/28/20 0554 12/31/20 0500 01/09/21 1443  NA 144 136   < > 135 138 135 139  K 4.4 4.1   < > 3.6 3.8 3.5 4.5  CL 103 94*   < > 99 101 95* 98  CO2 28 28   < > 30 27 31  35*  GLUCOSE 90 89   < > 137* 123* 129* 91  BUN 9 16   < > 11 14 5* 14  CALCIUM 9.4 9.9   < > 8.2* 8.5* 8.7* 9.2  CREATININE 0.66 0.78   < > 0.44 0.59 0.67 1.06*  GFRNONAA 96 82   < > >60 >60 >60 57*  GFRAA 111 95  --   --   --   --  66   < > = values in this interval not displayed.    LIVER FUNCTION TESTS: Recent Labs    12/22/20 0339 12/23/20 0411 12/27/20 0545  12/28/20 0554 12/31/20 0500  BILITOT 1.2 1.3* 0.6  --  0.7  AST 63* 32 18  --  15  ALT 77* 45* 24  --  16  ALKPHOS 79 72 62  --  74  PROT 4.9* 4.9* 5.4*  --  6.5  ALBUMIN 2.6* 2.2* 2.3* 2.3* 2.4*    TUMOR MARKERS: No results for input(s): AFPTM, CEA, CA199, CHROMGRNA in the last 8760 hours.  Assessment and Plan:  60 year old woman with LLQ abscess after colonic perforation, treated by CT guided drain placement on 01/15/2021.  She has had minimal output from the drain and has no fever or chills.  CT and Fluoroscopic abscessogram demonstrates resolution of the abscess and no fistulous communication.  The drain was removed.  Thank you for this interesting consult.  I greatly enjoyed meeting SUNG RENTON and look forward to participating in their care.  A copy of this report was sent to the requesting provider on this date.  Electronically Signed: Paula Libra Kaylan Yates, MD 01/15/2021, 5:32 PM   I spent a total of    10 Minutes in face to face in clinical consultation, greater than 50% of which was counseling/coordinating care for left lower quadrant abscess drain.

## 2021-01-16 ENCOUNTER — Ambulatory Visit: Payer: Medicare HMO | Admitting: General Surgery

## 2021-01-16 ENCOUNTER — Ambulatory Visit (INDEPENDENT_AMBULATORY_CARE_PROVIDER_SITE_OTHER): Payer: Medicare HMO | Admitting: Family Medicine

## 2021-01-16 ENCOUNTER — Encounter: Payer: Self-pay | Admitting: Family Medicine

## 2021-01-16 ENCOUNTER — Other Ambulatory Visit: Payer: Self-pay

## 2021-01-16 VITALS — BP 118/75 | HR 92 | Temp 97.6°F | Ht 64.0 in | Wt 178.0 lb

## 2021-01-16 DIAGNOSIS — D72829 Elevated white blood cell count, unspecified: Secondary | ICD-10-CM | POA: Diagnosis not present

## 2021-01-16 DIAGNOSIS — R399 Unspecified symptoms and signs involving the genitourinary system: Secondary | ICD-10-CM | POA: Diagnosis not present

## 2021-01-16 DIAGNOSIS — I10 Essential (primary) hypertension: Secondary | ICD-10-CM | POA: Diagnosis not present

## 2021-01-16 DIAGNOSIS — T8143XA Infection following a procedure, organ and space surgical site, initial encounter: Secondary | ICD-10-CM | POA: Diagnosis not present

## 2021-01-16 LAB — URINALYSIS, COMPLETE
Bilirubin, UA: NEGATIVE
Glucose, UA: NEGATIVE
Ketones, UA: NEGATIVE
Leukocytes,UA: NEGATIVE
Nitrite, UA: NEGATIVE
Protein,UA: NEGATIVE
RBC, UA: NEGATIVE
Specific Gravity, UA: 1.015 (ref 1.005–1.030)
Urobilinogen, Ur: 0.2 mg/dL (ref 0.2–1.0)
pH, UA: 6.5 (ref 5.0–7.5)

## 2021-01-16 LAB — MICROSCOPIC EXAMINATION: RBC, Urine: NONE SEEN /hpf (ref 0–2)

## 2021-01-16 NOTE — Progress Notes (Signed)
Acute Office Visit  Subjective:    Patient ID: [name], female    DOB: 10/17/1961, 60 y.o.   MRN: [id number]  Chief Complaint  Patient presents with  . Abdominal Pain    HPI Patient is in today for abdominal pain for the last week that felt like a pressure in her lower abdomen. She was also having bladder spasms and frequency. She did have a JP drain that was taken out yesterday and reports improvement in her symptoms since then. She denies fever, dysuria, or flank pain. She currently has an colostomy and an open abdominal wound that is currently dressed with an wet to dry dressing. She was concerned about a bladder infection. She will be getting some lab work that was ordered by her surgeon today on her way out.   Past Medical History:  Diagnosis Date  . Abdominal pain   . Anxiety   . Arthritis   . Blood in stool   . Breast changes, fibrocystic   . Bruises easily   . Cardiomegaly 2018  . Clotting disorder (HCC)     itp , none since 1976, no current hematologist  . Constipation   . DDD (degenerative disc disease), cervical    with lumbar issues  . Depression   . Generalized headaches   . GERD (gastroesophageal reflux disease)   . Hemorrhoid   . Hyperlipidemia   . Hypertension   . Idiopathic thrombocytopenia ([place])    as child  . Leg swelling    both ankles  . Liver lesion   . Nasal congestion   . Nausea & vomiting   . Neuromuscular disorder ([place])    back  injury  . Neuromuscular disorder ([place])    3 neck surgeries,neck fusion.pin,plates,screws  . Osteoporosis   . Panic attacks    pt also  has germaphobia  . Rectal bleeding   . Seizures ([place]) 1998   stress induced, one time, none since, no seizure meds  . Seizures ([place])    Stated she had 2 weeks ago,it was like a transient,stare.  . Sleep apnea    STOPBANG=4  . Spleen enlarged 1976, none since   r/t idiopathic thrombocytopenia  . Trouble swallowing   . Unintentional weight loss   . Weakness     weakness varies    Past Surgical History:  Procedure Laterality Date  . ABDOMINAL HYSTERECTOMY  1997  . BACK SURGERY    . CERVICAL SPINE SURGERY  2005, 2007, 2010   x3, fusion done,plates and screws present  . [place]  . CHOLECYSTECTOMY  03/14/2012   Procedure: LAPAROSCOPIC CHOLECYSTECTOMY;  Surgeon: Stark Klein, MD;  Location: [facility];  Service: General;  Laterality: N/A;  . COLECTOMY WITH COLOSTOMY CREATION/HARTMANN PROCEDURE N/A 12/21/2020   Procedure: COLECTOMY WITH COLOSTOMY CREATION/HARTMANN PROCEDURE;  Surgeon: Virl Cagey, MD;  Location: [facility];  Service: General;  Laterality: N/A;  . colonscopy and esophagogastrodudononescopy  02-04-12  . IR RADIOLOGIST EVAL & MGMT  01/15/2021  . LUMBAR DISC SURGERY  11/2014    Family History  Problem Relation Age of Onset  . Hypertension Mother   . Heart disease Mother   . Lung cancer Father   . Cancer Father        lung  . Colon cancer Paternal Grandmother   . Cancer Paternal Grandmother        colon  . Heart disease Maternal Grandmother   . Diabetes Sister   . Bipolar disorder Sister   .  Heart disease Sister        Congestive Heart Failure  . Hypertension Sister     Social History   Socioeconomic History  . Marital status: Married    Spouse name: Legrand Como  . Number of children: 3  . Years of education: 46  . Highest education level: Associate degree: academic program  Occupational History  . Occupation: Diabled    Employer: UNEMPLOYED  Tobacco Use  . Smoking status: Former Smoker    Packs/day: 1.00    Years: 12.00    Pack years: 12.00    Types: Cigarettes    Start date: 12/20/2005    Quit date: 09/20/2020    Years since quitting: 0.3  . Smokeless tobacco: Never Used  Vaping Use  . Vaping Use: Former  . Quit date: 09/07/2016  . Devices: only used for about a month  Substance and Sexual Activity  . Alcohol use: No    Alcohol/week: 0.0 standard drinks  . Drug use: Yes    Types: Marijuana   . Sexual activity: Yes  Other Topics Concern  . Not on file  Social History Narrative   She lives with her boyfriend.  She used to smoke, quit May 2013, she is not drinking. She has 3 children   Social Determinants of Radio broadcast assistant Strain: Not on file  Food Insecurity: Not on file  Transportation Needs: Not on file  Physical Activity: Not on file  Stress: Not on file  Social Connections: Not on file  Intimate Partner Violence: Not on file    Outpatient Medications Prior to Visit  Medication Sig Dispense Refill  . amitriptyline (ELAVIL) 25 MG tablet Take 25 mg by mouth at bedtime.    Marland Kitchen atenolol (TENORMIN) 50 MG tablet Take 1 tablet (50 mg total) by mouth daily. 90 tablet 3  . busPIRone (BUSPAR) 15 MG tablet Take 1 tablet (15 mg total) by mouth 3 (three) times daily. 270 tablet 3  . citalopram (CELEXA) 40 MG tablet Take 1 tablet (40 mg total) by mouth daily with breakfast. 90 tablet 3  . famotidine (PEPCID) 20 MG tablet Take 1 tablet (20 mg total) by mouth daily as needed for heartburn or indigestion. 30 tablet   . latanoprost (XALATAN) 0.005 % ophthalmic solution Place 1 drop into both eyes daily.    Marland Kitchen lisinopril-hydrochlorothiazide (ZESTORETIC) 20-25 MG tablet TAKE 1 TABLET BY MOUTH EVERY DAY (Patient taking differently: Take 1 tablet by mouth daily.) 90 tablet 2  . ondansetron (ZOFRAN) 4 MG tablet Take 4 mg by mouth every 8 (eight) hours as needed. for nausea    . oxyCODONE (OXY IR/ROXICODONE) 5 MG immediate release tablet Take 1 tablet (5 mg total) by mouth every 4 (four) hours as needed for severe pain or breakthrough pain. 20 tablet 0  . Oxycodone HCl 10 MG TABS Take 1 tablet (10 mg total) by mouth 2 (two) times daily.  0  . Polyethylene Glycol 3350 (MIRALAX PO) Take by mouth.    . rosuvastatin (CRESTOR) 20 MG tablet Take 1 tablet (20 mg total) by mouth daily. 90 tablet 3  . tiZANidine (ZANAFLEX) 4 MG tablet Take 4 mg by mouth 3 (three) times daily.    . traZODone  (DESYREL) 100 MG tablet Take 1.5 tablets (150 mg total) by mouth at bedtime as needed for sleep. 135 tablet 2  . hydrALAZINE (APRESOLINE) 50 MG tablet Take 1 tablet (50 mg total) by mouth 2 (two) times daily as needed. (Patient not taking:  No sig reported) 180 tablet 1  . nitroGLYCERIN (NITROSTAT) 0.4 MG SL tablet Place 1 tablet (0.4 mg total) under the tongue every 5 (five) minutes as needed for chest pain. 25 tablet 3   No facility-administered medications prior to visit.    Allergies  Allergen Reactions  . Nsaids Nausea Only    Abdominal pain    Review of Systems As per HPI.     Objective:    Physical Exam Vitals and nursing note reviewed.  Constitutional:      General: She is not in acute distress.    Appearance: She is not ill-appearing, toxic-appearing or diaphoretic.  Cardiovascular:     Rate and Rhythm: Normal rate and regular rhythm.     Heart sounds: Normal heart sounds. No friction rub.  Pulmonary:     Effort: Pulmonary effort is normal.     Breath sounds: Normal breath sounds.  Abdominal:     Palpations: Abdomen is soft.     Tenderness: There is no right CVA tenderness, left CVA tenderness, guarding or rebound.     Comments: Colostomy bag in place. Dressing in place to lower midline abdomen. No drainage present on dressing.   Skin:    General: Skin is warm.     Findings: No erythema.  Neurological:     General: No focal deficit present.     Mental Status: She is alert and oriented to person, place, and time.  Psychiatric:        Mood and Affect: Mood normal.     BP 118/75   Pulse 92   Temp 97.6 F (36.4 C) (Temporal)   Ht 5\' 4"  (1.626 m)   Wt 178 lb (80.7 kg)   BMI 30.55 kg/m  Wt Readings from Last 3 Encounters:  01/16/21 178 lb (80.7 kg)  01/09/21 177 lb (80.3 kg)  12/31/20 186 lb 1.1 oz (84.4 kg)   Urine dipstick shows negative for all components.  Micro exam: 0-5 WBC's per HPF, 0 RBC's per HPF and few bacteria.   Health Maintenance Due   Topic Date Due  . PAP SMEAR-Modifier  07/03/2019  . MAMMOGRAM  11/04/2020    There are no preventive care reminders to display for this patient.   Lab Results  Component Value Date   TSH 1.136 07/09/2016   Lab Results  Component Value Date   WBC 12.6 (H) 01/09/2021   HGB 11.1 (L) 01/09/2021   HCT 33.6 (L) 01/09/2021   MCV 86.4 01/09/2021   PLT 503 (H) 01/09/2021   Lab Results  Component Value Date   NA 139 01/09/2021   K 4.5 01/09/2021   CO2 35 (H) 01/09/2021   GLUCOSE 91 01/09/2021   BUN 14 01/09/2021   CREATININE 1.06 (H) 01/09/2021   BILITOT 0.7 12/31/2020   ALKPHOS 74 12/31/2020   AST 15 12/31/2020   ALT 16 12/31/2020   PROT 6.5 12/31/2020   ALBUMIN 2.4 (L) 12/31/2020   CALCIUM 9.2 01/09/2021   ANIONGAP 9 12/31/2020   GFR 74.95 09/21/2017   Lab Results  Component Value Date   CHOL 228 (H) 05/30/2020   Lab Results  Component Value Date   HDL 54 05/30/2020   Lab Results  Component Value Date   LDLCALC 149 (H) 05/30/2020   Lab Results  Component Value Date   TRIG 104 12/22/2020   Lab Results  Component Value Date   CHOLHDL 4.2 05/30/2020   Lab Results  Component Value Date   HGBA1C 5.6 05/30/2020  Assessment & Plan:    Jakeya was seen today for abdominal pain.  Diagnoses and all orders for this visit:  UTI symptoms UA negative. Symptoms have improved since removal of JP drain yesterday. Return to office for new or worsening symptoms, or if symptoms persist.  -     Urinalysis, Complete  Abscess, intra-abdominal, postoperative Ordered by surgeon. Patient will stop by lab before leaving today. Clean dry dressing in place on exam. No surround erythema.  -     CBC with Differential/Platelet -     Basic metabolic panel  The patient indicates understanding of these issues and agrees with the plan.  Gwenlyn Perking, FNP

## 2021-01-17 ENCOUNTER — Ambulatory Visit (INDEPENDENT_AMBULATORY_CARE_PROVIDER_SITE_OTHER): Payer: Medicare HMO

## 2021-01-17 DIAGNOSIS — Z933 Colostomy status: Secondary | ICD-10-CM | POA: Diagnosis not present

## 2021-01-17 DIAGNOSIS — Z9049 Acquired absence of other specified parts of digestive tract: Secondary | ICD-10-CM

## 2021-01-17 DIAGNOSIS — I119 Hypertensive heart disease without heart failure: Secondary | ICD-10-CM

## 2021-01-17 DIAGNOSIS — J690 Pneumonitis due to inhalation of food and vomit: Secondary | ICD-10-CM | POA: Diagnosis not present

## 2021-01-17 DIAGNOSIS — F419 Anxiety disorder, unspecified: Secondary | ICD-10-CM

## 2021-01-17 DIAGNOSIS — Z4801 Encounter for change or removal of surgical wound dressing: Secondary | ICD-10-CM | POA: Diagnosis not present

## 2021-01-17 DIAGNOSIS — Z433 Encounter for attention to colostomy: Secondary | ICD-10-CM | POA: Diagnosis not present

## 2021-01-17 DIAGNOSIS — Z48815 Encounter for surgical aftercare following surgery on the digestive system: Secondary | ICD-10-CM | POA: Diagnosis not present

## 2021-01-17 DIAGNOSIS — K219 Gastro-esophageal reflux disease without esophagitis: Secondary | ICD-10-CM

## 2021-01-17 DIAGNOSIS — G4730 Sleep apnea, unspecified: Secondary | ICD-10-CM

## 2021-01-17 DIAGNOSIS — D689 Coagulation defect, unspecified: Secondary | ICD-10-CM

## 2021-01-17 DIAGNOSIS — K658 Other peritonitis: Secondary | ICD-10-CM | POA: Diagnosis not present

## 2021-01-17 DIAGNOSIS — R69 Illness, unspecified: Secondary | ICD-10-CM | POA: Diagnosis not present

## 2021-01-17 DIAGNOSIS — R569 Unspecified convulsions: Secondary | ICD-10-CM

## 2021-01-17 DIAGNOSIS — K649 Unspecified hemorrhoids: Secondary | ICD-10-CM

## 2021-01-17 DIAGNOSIS — R519 Headache, unspecified: Secondary | ICD-10-CM

## 2021-01-17 DIAGNOSIS — M810 Age-related osteoporosis without current pathological fracture: Secondary | ICD-10-CM

## 2021-01-17 DIAGNOSIS — M1990 Unspecified osteoarthritis, unspecified site: Secondary | ICD-10-CM

## 2021-01-17 DIAGNOSIS — Z79891 Long term (current) use of opiate analgesic: Secondary | ICD-10-CM

## 2021-01-17 DIAGNOSIS — K769 Liver disease, unspecified: Secondary | ICD-10-CM

## 2021-01-17 DIAGNOSIS — M5030 Other cervical disc degeneration, unspecified cervical region: Secondary | ICD-10-CM

## 2021-01-17 DIAGNOSIS — D473 Essential (hemorrhagic) thrombocythemia: Secondary | ICD-10-CM

## 2021-01-17 DIAGNOSIS — G709 Myoneural disorder, unspecified: Secondary | ICD-10-CM

## 2021-01-17 DIAGNOSIS — F17210 Nicotine dependence, cigarettes, uncomplicated: Secondary | ICD-10-CM

## 2021-01-17 DIAGNOSIS — F32A Depression, unspecified: Secondary | ICD-10-CM

## 2021-01-17 LAB — CBC WITH DIFFERENTIAL/PLATELET
Basophils Absolute: 0.1 10*3/uL (ref 0.0–0.2)
Basos: 1 %
EOS (ABSOLUTE): 1.4 10*3/uL — ABNORMAL HIGH (ref 0.0–0.4)
Eos: 12 %
Hematocrit: 33.2 % — ABNORMAL LOW (ref 34.0–46.6)
Hemoglobin: 10.8 g/dL — ABNORMAL LOW (ref 11.1–15.9)
Immature Grans (Abs): 0.1 10*3/uL (ref 0.0–0.1)
Immature Granulocytes: 1 %
Lymphocytes Absolute: 3.6 10*3/uL — ABNORMAL HIGH (ref 0.7–3.1)
Lymphs: 30 %
MCH: 28 pg (ref 26.6–33.0)
MCHC: 32.5 g/dL (ref 31.5–35.7)
MCV: 86 fL (ref 79–97)
Monocytes Absolute: 0.9 10*3/uL (ref 0.1–0.9)
Monocytes: 8 %
Neutrophils Absolute: 5.8 10*3/uL (ref 1.4–7.0)
Neutrophils: 48 %
Platelets: 368 10*3/uL (ref 150–450)
RBC: 3.86 x10E6/uL (ref 3.77–5.28)
RDW: 14.2 % (ref 11.7–15.4)
WBC: 11.9 10*3/uL — ABNORMAL HIGH (ref 3.4–10.8)

## 2021-01-17 LAB — BASIC METABOLIC PANEL
BUN/Creatinine Ratio: 9 — ABNORMAL LOW (ref 12–28)
BUN: 6 mg/dL — ABNORMAL LOW (ref 8–27)
CO2: 26 mmol/L (ref 20–29)
Calcium: 8.9 mg/dL (ref 8.7–10.3)
Chloride: 97 mmol/L (ref 96–106)
Creatinine, Ser: 0.69 mg/dL (ref 0.57–1.00)
Glucose: 91 mg/dL (ref 65–99)
Potassium: 4.2 mmol/L (ref 3.5–5.2)
Sodium: 137 mmol/L (ref 134–144)
eGFR: 99 mL/min/{1.73_m2} (ref >59)

## 2021-01-17 NOTE — Addendum Note (Signed)
Addended byFaylene Million C on: 01/17/2021 02:00 PM   Modules accepted: Orders

## 2021-01-22 ENCOUNTER — Other Ambulatory Visit: Payer: Medicare HMO

## 2021-01-22 ENCOUNTER — Other Ambulatory Visit: Payer: Self-pay

## 2021-01-22 DIAGNOSIS — D72829 Elevated white blood cell count, unspecified: Secondary | ICD-10-CM | POA: Diagnosis not present

## 2021-01-22 LAB — CBC WITH DIFFERENTIAL/PLATELET
Basophils Absolute: 0.1 10*3/uL (ref 0.0–0.2)
Basos: 1 %
EOS (ABSOLUTE): 1.2 10*3/uL — ABNORMAL HIGH (ref 0.0–0.4)
Eos: 9 %
Hematocrit: 36.4 % (ref 34.0–46.6)
Hemoglobin: 11.9 g/dL (ref 11.1–15.9)
Immature Grans (Abs): 0.1 10*3/uL (ref 0.0–0.1)
Immature Granulocytes: 1 %
Lymphocytes Absolute: 3.1 10*3/uL (ref 0.7–3.1)
Lymphs: 21 %
MCH: 28.1 pg (ref 26.6–33.0)
MCHC: 32.7 g/dL (ref 31.5–35.7)
MCV: 86 fL (ref 79–97)
Monocytes Absolute: 0.8 10*3/uL (ref 0.1–0.9)
Monocytes: 6 %
Neutrophils Absolute: 9.3 10*3/uL — ABNORMAL HIGH (ref 1.4–7.0)
Neutrophils: 62 %
Platelets: 331 10*3/uL (ref 150–450)
RBC: 4.23 x10E6/uL (ref 3.77–5.28)
RDW: 14.3 % (ref 11.7–15.4)
WBC: 14.6 10*3/uL — ABNORMAL HIGH (ref 3.4–10.8)

## 2021-01-23 ENCOUNTER — Encounter: Payer: Self-pay | Admitting: General Surgery

## 2021-01-23 ENCOUNTER — Ambulatory Visit (INDEPENDENT_AMBULATORY_CARE_PROVIDER_SITE_OTHER): Payer: Self-pay | Admitting: General Surgery

## 2021-01-23 VITALS — BP 106/73 | HR 80 | Temp 98.1°F | Resp 14 | Ht 64.0 in | Wt 175.0 lb

## 2021-01-23 DIAGNOSIS — Z933 Colostomy status: Secondary | ICD-10-CM

## 2021-01-23 DIAGNOSIS — R69 Illness, unspecified: Secondary | ICD-10-CM | POA: Diagnosis not present

## 2021-01-23 DIAGNOSIS — J690 Pneumonitis due to inhalation of food and vomit: Secondary | ICD-10-CM | POA: Diagnosis not present

## 2021-01-23 DIAGNOSIS — M5030 Other cervical disc degeneration, unspecified cervical region: Secondary | ICD-10-CM | POA: Diagnosis not present

## 2021-01-23 DIAGNOSIS — K658 Other peritonitis: Secondary | ICD-10-CM | POA: Diagnosis not present

## 2021-01-23 DIAGNOSIS — D689 Coagulation defect, unspecified: Secondary | ICD-10-CM | POA: Diagnosis not present

## 2021-01-23 DIAGNOSIS — Z48815 Encounter for surgical aftercare following surgery on the digestive system: Secondary | ICD-10-CM | POA: Diagnosis not present

## 2021-01-23 DIAGNOSIS — I119 Hypertensive heart disease without heart failure: Secondary | ICD-10-CM | POA: Diagnosis not present

## 2021-01-23 DIAGNOSIS — Z4801 Encounter for change or removal of surgical wound dressing: Secondary | ICD-10-CM | POA: Diagnosis not present

## 2021-01-23 DIAGNOSIS — Z433 Encounter for attention to colostomy: Secondary | ICD-10-CM | POA: Diagnosis not present

## 2021-01-23 NOTE — Progress Notes (Signed)
[facility]   HPI:  60 y.o. Female presents to clinic for follow-up evaluation of her ostomy. She is doing well and has her drain out. She is tolerating a diet, having no fevers. She still has a leukocytosis that was on labwork this week. No UTI.  Review of Systems:  No fever Improving pain Regular ostomy output  All other review of systems: otherwise negative   Vital Signs:  BP 106/73   Pulse 80   Temp 98.1 F (36.7 C) (Other (Comment))   Resp 14   Ht 5\' 4"  (1.626 m)   Wt 175 lb (79.4 kg)   SpO2 94%   BMI 30.04 kg/m    Physical Exam:  Physical Exam Vitals reviewed.  Cardiovascular:     Rate and Rhythm: Normal rate.  Pulmonary:     Effort: Pulmonary effort is normal.  Abdominal:     General: There is no distension.     Palpations: Abdomen is soft.     Tenderness: There is no abdominal tenderness.     Comments: Ostomy pink and healthy, midline wound with granulation, healing in by secondary intention, no signs of infection, no drainage    Results for BALINDA, HEACOCK (MRN [id number]) as of 01/24/2021 10:51  Ref. Range 01/22/2021 11:21  WBC Latest Ref Range: 3.4 - 10.8 x10E3/uL 14.6 (H)  RBC Latest Ref Range: 3.77 - 5.28 x10E6/uL 4.23  Hemoglobin Latest Ref Range: 11.1 - 15.9 g/dL 11.9  HCT Latest Ref Range: 34.0 - 46.6 % 36.4  MCV Latest Ref Range: 79 - 97 fL 86  MCH Latest Ref Range: 26.6 - 33.0 pg 28.1  MCHC Latest Ref Range: 31.5 - 35.7 g/dL 32.7  RDW Latest Ref Range: 11.7 - 15.4 % 14.3  Platelets Latest Ref Range: 150 - 450 x10E3/uL 331  Neutrophils Latest Ref Range: Not Estab. % 62  Immature Granulocytes Latest Ref Range: Not Estab. % 1  NEUT# Latest Ref Range: 1.4 - 7.0 x10E3/uL 9.3 (H)  Lymphocyte # Latest Ref Range: 0.7 - 3.1 x10E3/uL 3.1  Monocytes Absolute Latest Ref Range: 0.1 - 0.9 x10E3/uL 0.8  Basophils Absolute Latest Ref Range: 0.0 - 0.2 x10E3/uL 0.1  Immature Grans (Abs) Latest Ref Range: 0.0 - 0.1 x10E3/uL 0.1  Lymphs  Latest Ref Range: Not Estab. % 21  Monocytes Latest Ref Range: Not Estab. % 6  Basos Latest Ref Range: Not Estab. % 1  Eos Latest Ref Range: Not Estab. % 9  EOS (ABSOLUTE) Latest Ref Range: 0.0 - 0.4 x10E3/uL 1.2 (H)   Assessment:  60 y.o. yo Female doing well after a hartman's procedure. She is healing and has her drain out. Still with leukocytosis but this could be inflammatory/ reactive/ stress related.  Plan:  - Will ask Dr. Lajuana Ripple to repeat lab in [place], will monitor - Patient to notify us if any fevers or signs of infection  - Will see patient back in a few weeks  - Will refer to GI for colonoscopy so we can get that done so we can plan reversal after 3 months out, she is aiming for June/July    Future Appointments  Date Time Provider [place]  03/06/2021  1:15 PM Virl Cagey, MD [facility] None  09/05/2021  1:15 PM [name], DO [facility] None  09/22/2021  1:15 PM [facility]-ANNUAL WELLNESS VISIT [facility] None    [name], MD [facility] [address] [place], [place] [address] [phone] (office)

## 2021-01-23 NOTE — Patient Instructions (Signed)
Continue dressing changes. Ostomy care continue. Will refer to GI.

## 2021-01-24 ENCOUNTER — Other Ambulatory Visit: Payer: Self-pay | Admitting: Family Medicine

## 2021-01-24 DIAGNOSIS — T8143XA Infection following a procedure, organ and space surgical site, initial encounter: Secondary | ICD-10-CM

## 2021-01-24 DIAGNOSIS — Z933 Colostomy status: Secondary | ICD-10-CM | POA: Diagnosis not present

## 2021-01-29 DIAGNOSIS — J690 Pneumonitis due to inhalation of food and vomit: Secondary | ICD-10-CM | POA: Diagnosis not present

## 2021-01-29 DIAGNOSIS — M5030 Other cervical disc degeneration, unspecified cervical region: Secondary | ICD-10-CM | POA: Diagnosis not present

## 2021-01-29 DIAGNOSIS — Z433 Encounter for attention to colostomy: Secondary | ICD-10-CM | POA: Diagnosis not present

## 2021-01-29 DIAGNOSIS — Z48815 Encounter for surgical aftercare following surgery on the digestive system: Secondary | ICD-10-CM | POA: Diagnosis not present

## 2021-01-29 DIAGNOSIS — I119 Hypertensive heart disease without heart failure: Secondary | ICD-10-CM | POA: Diagnosis not present

## 2021-01-29 DIAGNOSIS — K658 Other peritonitis: Secondary | ICD-10-CM | POA: Diagnosis not present

## 2021-01-29 DIAGNOSIS — Z4801 Encounter for change or removal of surgical wound dressing: Secondary | ICD-10-CM | POA: Diagnosis not present

## 2021-01-29 DIAGNOSIS — D689 Coagulation defect, unspecified: Secondary | ICD-10-CM | POA: Diagnosis not present

## 2021-01-29 DIAGNOSIS — R69 Illness, unspecified: Secondary | ICD-10-CM | POA: Diagnosis not present

## 2021-01-30 ENCOUNTER — Encounter (INDEPENDENT_AMBULATORY_CARE_PROVIDER_SITE_OTHER): Payer: Self-pay | Admitting: *Deleted

## 2021-02-05 ENCOUNTER — Encounter: Payer: Self-pay | Admitting: Family Medicine

## 2021-02-05 DIAGNOSIS — Z48815 Encounter for surgical aftercare following surgery on the digestive system: Secondary | ICD-10-CM | POA: Diagnosis not present

## 2021-02-05 DIAGNOSIS — Z433 Encounter for attention to colostomy: Secondary | ICD-10-CM | POA: Diagnosis not present

## 2021-02-05 DIAGNOSIS — M5030 Other cervical disc degeneration, unspecified cervical region: Secondary | ICD-10-CM | POA: Diagnosis not present

## 2021-02-05 DIAGNOSIS — R69 Illness, unspecified: Secondary | ICD-10-CM | POA: Diagnosis not present

## 2021-02-05 DIAGNOSIS — I119 Hypertensive heart disease without heart failure: Secondary | ICD-10-CM | POA: Diagnosis not present

## 2021-02-05 DIAGNOSIS — Z4801 Encounter for change or removal of surgical wound dressing: Secondary | ICD-10-CM | POA: Diagnosis not present

## 2021-02-05 DIAGNOSIS — K658 Other peritonitis: Secondary | ICD-10-CM | POA: Diagnosis not present

## 2021-02-05 DIAGNOSIS — J690 Pneumonitis due to inhalation of food and vomit: Secondary | ICD-10-CM | POA: Diagnosis not present

## 2021-02-05 DIAGNOSIS — D689 Coagulation defect, unspecified: Secondary | ICD-10-CM | POA: Diagnosis not present

## 2021-02-07 ENCOUNTER — Telehealth (INDEPENDENT_AMBULATORY_CARE_PROVIDER_SITE_OTHER): Payer: Medicare HMO | Admitting: General Surgery

## 2021-02-07 DIAGNOSIS — Z933 Colostomy status: Secondary | ICD-10-CM

## 2021-02-07 NOTE — Telephone Encounter (Signed)
[facility]  Patient received letter from GI and is concerned about the letter. She called the office about it. She had seen a GI doctor in [place] but wants to move to [place].  She is going to get an appt and told her to not worry and that we will get the colonoscopy scheduled and plan for reversal after 3 months.  Betty Labrum, MD [facility] [address] [place], [place] [address] [phone] (office)

## 2021-02-11 ENCOUNTER — Telehealth (INDEPENDENT_AMBULATORY_CARE_PROVIDER_SITE_OTHER): Payer: Medicare HMO | Admitting: General Surgery

## 2021-02-11 DIAGNOSIS — H401131 Primary open-angle glaucoma, bilateral, mild stage: Secondary | ICD-10-CM | POA: Diagnosis not present

## 2021-02-11 DIAGNOSIS — Z933 Colostomy status: Secondary | ICD-10-CM

## 2021-02-11 NOTE — Telephone Encounter (Signed)
[facility]  Patient called office and said she was bleeding around ostomy. Some bleeding and irritation is expected esp if changing wafer.  Told her to hold pressure if it did not stop.   Gave her some reassure.   [name], MD [facility] [address] [place], [place] [address] [phone] (office)

## 2021-02-12 ENCOUNTER — Other Ambulatory Visit: Payer: Self-pay | Admitting: Family Medicine

## 2021-02-12 DIAGNOSIS — J690 Pneumonitis due to inhalation of food and vomit: Secondary | ICD-10-CM | POA: Diagnosis not present

## 2021-02-12 DIAGNOSIS — F5104 Psychophysiologic insomnia: Secondary | ICD-10-CM

## 2021-02-12 DIAGNOSIS — D689 Coagulation defect, unspecified: Secondary | ICD-10-CM | POA: Diagnosis not present

## 2021-02-12 DIAGNOSIS — K658 Other peritonitis: Secondary | ICD-10-CM | POA: Diagnosis not present

## 2021-02-12 DIAGNOSIS — M5030 Other cervical disc degeneration, unspecified cervical region: Secondary | ICD-10-CM | POA: Diagnosis not present

## 2021-02-12 DIAGNOSIS — I119 Hypertensive heart disease without heart failure: Secondary | ICD-10-CM | POA: Diagnosis not present

## 2021-02-12 DIAGNOSIS — R69 Illness, unspecified: Secondary | ICD-10-CM | POA: Diagnosis not present

## 2021-02-12 DIAGNOSIS — Z433 Encounter for attention to colostomy: Secondary | ICD-10-CM | POA: Diagnosis not present

## 2021-02-12 DIAGNOSIS — Z48815 Encounter for surgical aftercare following surgery on the digestive system: Secondary | ICD-10-CM | POA: Diagnosis not present

## 2021-02-12 DIAGNOSIS — Z4801 Encounter for change or removal of surgical wound dressing: Secondary | ICD-10-CM | POA: Diagnosis not present

## 2021-02-12 NOTE — Telephone Encounter (Signed)
Note:

## 2021-02-18 DIAGNOSIS — M542 Cervicalgia: Secondary | ICD-10-CM | POA: Diagnosis not present

## 2021-02-18 DIAGNOSIS — G894 Chronic pain syndrome: Secondary | ICD-10-CM | POA: Diagnosis not present

## 2021-02-18 DIAGNOSIS — M4322 Fusion of spine, cervical region: Secondary | ICD-10-CM | POA: Diagnosis not present

## 2021-02-21 DIAGNOSIS — Z48815 Encounter for surgical aftercare following surgery on the digestive system: Secondary | ICD-10-CM | POA: Diagnosis not present

## 2021-02-21 DIAGNOSIS — Z933 Colostomy status: Secondary | ICD-10-CM | POA: Diagnosis not present

## 2021-02-21 DIAGNOSIS — Z4801 Encounter for change or removal of surgical wound dressing: Secondary | ICD-10-CM | POA: Diagnosis not present

## 2021-02-21 DIAGNOSIS — K658 Other peritonitis: Secondary | ICD-10-CM | POA: Diagnosis not present

## 2021-02-21 DIAGNOSIS — M5030 Other cervical disc degeneration, unspecified cervical region: Secondary | ICD-10-CM | POA: Diagnosis not present

## 2021-02-21 DIAGNOSIS — J690 Pneumonitis due to inhalation of food and vomit: Secondary | ICD-10-CM | POA: Diagnosis not present

## 2021-02-21 DIAGNOSIS — I119 Hypertensive heart disease without heart failure: Secondary | ICD-10-CM | POA: Diagnosis not present

## 2021-02-21 DIAGNOSIS — Z433 Encounter for attention to colostomy: Secondary | ICD-10-CM | POA: Diagnosis not present

## 2021-02-21 DIAGNOSIS — D689 Coagulation defect, unspecified: Secondary | ICD-10-CM | POA: Diagnosis not present

## 2021-02-21 DIAGNOSIS — R69 Illness, unspecified: Secondary | ICD-10-CM | POA: Diagnosis not present

## 2021-02-26 ENCOUNTER — Telehealth: Payer: Self-pay | Admitting: Gastroenterology

## 2021-02-26 DIAGNOSIS — Z433 Encounter for attention to colostomy: Secondary | ICD-10-CM | POA: Diagnosis not present

## 2021-02-26 DIAGNOSIS — I119 Hypertensive heart disease without heart failure: Secondary | ICD-10-CM | POA: Diagnosis not present

## 2021-02-26 DIAGNOSIS — Z4801 Encounter for change or removal of surgical wound dressing: Secondary | ICD-10-CM | POA: Diagnosis not present

## 2021-02-26 DIAGNOSIS — R69 Illness, unspecified: Secondary | ICD-10-CM | POA: Diagnosis not present

## 2021-02-26 DIAGNOSIS — K658 Other peritonitis: Secondary | ICD-10-CM | POA: Diagnosis not present

## 2021-02-26 DIAGNOSIS — M5030 Other cervical disc degeneration, unspecified cervical region: Secondary | ICD-10-CM | POA: Diagnosis not present

## 2021-02-26 DIAGNOSIS — D689 Coagulation defect, unspecified: Secondary | ICD-10-CM | POA: Diagnosis not present

## 2021-02-26 DIAGNOSIS — Z48815 Encounter for surgical aftercare following surgery on the digestive system: Secondary | ICD-10-CM | POA: Diagnosis not present

## 2021-02-26 DIAGNOSIS — J690 Pneumonitis due to inhalation of food and vomit: Secondary | ICD-10-CM | POA: Diagnosis not present

## 2021-02-26 NOTE — Telephone Encounter (Signed)
The pt has been scheduled for office visit to discuss colon prior to surgery.  Appt made for 5/11. The pt has been advised of the information and verbalized understanding.

## 2021-02-27 ENCOUNTER — Other Ambulatory Visit (INDEPENDENT_AMBULATORY_CARE_PROVIDER_SITE_OTHER): Payer: Self-pay

## 2021-03-05 ENCOUNTER — Encounter: Payer: Self-pay | Admitting: *Deleted

## 2021-03-06 ENCOUNTER — Ambulatory Visit (INDEPENDENT_AMBULATORY_CARE_PROVIDER_SITE_OTHER): Payer: Medicare HMO | Admitting: General Surgery

## 2021-03-06 ENCOUNTER — Encounter: Payer: Self-pay | Admitting: General Surgery

## 2021-03-06 ENCOUNTER — Other Ambulatory Visit: Payer: Self-pay

## 2021-03-06 VITALS — BP 109/76 | HR 76 | Temp 98.3°F | Resp 16 | Ht 64.0 in | Wt 179.0 lb

## 2021-03-06 DIAGNOSIS — Z933 Colostomy status: Secondary | ICD-10-CM

## 2021-03-06 DIAGNOSIS — K5900 Constipation, unspecified: Secondary | ICD-10-CM

## 2021-03-06 DIAGNOSIS — K633 Ulcer of intestine: Secondary | ICD-10-CM

## 2021-03-06 NOTE — Progress Notes (Signed)
[facility]   HPI:  60 y.o. Female presents to clinic for follow-up evaluation of her ostomy. She is doing well and midline is healed. She is scheduled to see GI in a few weeks for her colonoscopy.   Review of Systems:  No fevers or chills Regular Bms every 2 days  All other review of systems: otherwise negative   Vital Signs:  BP 109/76   Pulse 76   Temp 98.3 F (36.8 C) (Other (Comment))   Resp 16   Ht 5\' 4"  (1.626 m)   Wt 179 lb (81.2 kg)   SpO2 95%   BMI 30.73 kg/m    Physical Exam:  Physical Exam Vitals reviewed.  Cardiovascular:     Rate and Rhythm: Normal rate.  Pulmonary:     Effort: Pulmonary effort is normal.  Abdominal:     General: There is no distension.     Palpations: Abdomen is soft.     Tenderness: There is no abdominal tenderness.     Comments: Ostomy with bag, stool in bag, midline healed     Assessment:  60 y.o. yo Female with colostomy in place after perforation from stercoral ulcer. Doing well overall. Midline healed. Will try to reverse in June/July (3-4 months out).  Plan:  Continue colostomy care Go see GI to get set up for colonoscopy  Continue to keep your stools soft and regular  We will have to have the colonoscopy before we reverse you   The patient's insurance is asking for a CPT code. I have provider her with my best guess but have explained that this could change based on what is done at the time of her colonoscopy.   ICD codes- Diagnosis codes Colostomy in place Z93.3 Stercoral ulcer of large intestine K63.3 Constipation, unspecified type K59.00  CPT codes- Procedure codes for colonoscopy. These could be different once they do the procedure depending on what they have to do.  Colonoscopy through a stoma [phone] Colonoscopy through a stoma with biopsy [phone]    Follow up for discussion of reversal once Colonoscopy is done or if needed between now and then. She will make appointment after colonoscopy.    [name], MD [facility] [address] [place], [place] [address] [phone] (office)

## 2021-03-06 NOTE — Patient Instructions (Addendum)
Continue colostomy care Go see GI to get set up for colonoscopy  Continue to keep your stools soft and regular  We will have to have the colonoscopy before we reverse you   ICD codes- Diagnosis codes Colostomy in place Z93.3 Stercoral ulcer of large intestine K63.3 Constipation, unspecified type K59.00  CPT codes- Procedure codes for colonoscopy. These could be different once they do the procedure depending on what they have to do.  Colonoscopy through a stoma [phone] Colonoscopy through a stoma with biopsy [phone]

## 2021-03-13 DIAGNOSIS — Z933 Colostomy status: Secondary | ICD-10-CM | POA: Diagnosis not present

## 2021-03-19 ENCOUNTER — Encounter: Payer: Self-pay | Admitting: Gastroenterology

## 2021-03-19 ENCOUNTER — Ambulatory Visit: Payer: Medicare HMO | Admitting: Gastroenterology

## 2021-03-19 ENCOUNTER — Other Ambulatory Visit: Payer: Self-pay

## 2021-03-19 VITALS — BP 122/70 | HR 76 | Ht 62.6 in | Wt 176.0 lb

## 2021-03-19 DIAGNOSIS — Z79899 Other long term (current) drug therapy: Secondary | ICD-10-CM | POA: Diagnosis not present

## 2021-03-19 DIAGNOSIS — M4322 Fusion of spine, cervical region: Secondary | ICD-10-CM | POA: Diagnosis not present

## 2021-03-19 DIAGNOSIS — M542 Cervicalgia: Secondary | ICD-10-CM | POA: Diagnosis not present

## 2021-03-19 DIAGNOSIS — Z1211 Encounter for screening for malignant neoplasm of colon: Secondary | ICD-10-CM

## 2021-03-19 DIAGNOSIS — Z79891 Long term (current) use of opiate analgesic: Secondary | ICD-10-CM | POA: Diagnosis not present

## 2021-03-19 DIAGNOSIS — G894 Chronic pain syndrome: Secondary | ICD-10-CM | POA: Diagnosis not present

## 2021-03-19 MED ORDER — SUPREP BOWEL PREP KIT 17.5-3.13-1.6 GM/177ML PO SOLN: 1.0000 | ORAL | 0 refills | Status: DC

## 2021-03-19 NOTE — Progress Notes (Signed)
Review of pertinent gastrointestinal problems:  1. Routine risk for colon cancer.Colonoscopy March 2013 found mild diverticulosis only. Recommended 10 year recall colonoscopy  2. intermittent abdominal pains, felt possibly due to 4-5 cm cystic lesion in liver. She underwent wedge resection of liver 03/2012 and these were found to be hamartomas. Her gallbladder was also removed at the same time. Pains improved after the surgery. 3. EGD for mild dysphasia, dyspepsia March 2013 showed mild gastritis that was H. pylori negative. 4. EGD 11/2012 forweight loss, post prandial discomfortshowed mild gastritis, biopsies showed no H. Pylori. 10/2012 CT scan Of abdomen and pelvis with IV and oral contrast last month was normal CBC, complete metabolic profile last month were normal. GES afterwards showed slow gastric emptying (felt possibly due to narcotic pain meds). Was advised to cut down narcotics.  EGD February 2017 for right-sided abdominal pains thickened distal esophagus showed a normal upper GI tract.  I recommended that she continue taking Reglan 10 mg pills 1 pill at bedtime every night but that seemed to be helping some of her previous symptoms.   HPI: This is a very pleasant 60 year old woman whom I last saw here in the office about 7 months ago.  We discussed her GERD and some right sided positional abdominal pains that I felt were likely related to scar tissue from her previous liver surgery.  She underwent emergent sigmoid resection and end colostomy early February 2022 when she presented with pneumoperitoneum from a stercoral ulcer of the sigmoid colon, fecal peritonitis.  This was complicated by postoperative fluid collection which proved to be an abscess, required interventional drain placement that remained in place for about 6 weeks.  She has recovered slowly but fully.  Her surgical midline wound healed by secondary intention very nicely.  Her left-sided colostomy has been functioning  well.  Prior to the surgery she was very bothered by lifelong constipation.  Since surgery she is really focusing on staying hydrated and that seems to make a big difference for her.  Colon cancer does not run in her family.  She does not have blood in her stool.  ROS: complete GI ROS as described in HPI, all other review negative.  Constitutional:  No unintentional weight loss   Past Medical History:  Diagnosis Date  . Abdominal pain   . Anxiety   . Arthritis   . Blood in stool   . Breast changes, fibrocystic   . Bruises easily   . Cardiomegaly 2018  . Clotting disorder (HCC)     itp , none since 1976, no current hematologist  . Constipation   . DDD (degenerative disc disease), cervical    with lumbar issues  . Depression   . Generalized headaches   . GERD (gastroesophageal reflux disease)   . Hemorrhoid   . Hyperlipidemia   . Hypertension   . Idiopathic thrombocytopenia ([place])    as child  . Leg swelling    both ankles  . Liver lesion   . Nasal congestion   . Nausea & vomiting   . Neuromuscular disorder ([place])    back  injury  . Neuromuscular disorder ([place])    3 neck surgeries,neck fusion.pin,plates,screws  . Osteoporosis   . Panic attacks    pt also  has germaphobia  . Rectal bleeding   . Seizures ([place]) 1998   stress induced, one time, none since, no seizure meds  . Seizures ([place])    Stated she had 2 weeks ago,it was like a transient,stare.  Marland Kitchen  Sleep apnea    STOPBANG=4  . Spleen enlarged 1976, none since   r/t idiopathic thrombocytopenia  . Trouble swallowing   . Unintentional weight loss   . Weakness    weakness varies    Past Surgical History:  Procedure Laterality Date  . ABDOMINAL HYSTERECTOMY  1997  . BACK SURGERY    . CERVICAL SPINE SURGERY  2005, 2007, 2010   x3, fusion done,plates and screws present  . [place]  . CHOLECYSTECTOMY  03/14/2012   Procedure: LAPAROSCOPIC CHOLECYSTECTOMY;  Surgeon: Stark Klein, MD;   Location: [facility];  Service: General;  Laterality: N/A;  . COLECTOMY WITH COLOSTOMY CREATION/HARTMANN PROCEDURE N/A 12/21/2020   Procedure: COLECTOMY WITH COLOSTOMY CREATION/HARTMANN PROCEDURE;  Surgeon: Virl Cagey, MD;  Location: [facility];  Service: General;  Laterality: N/A;  . colonscopy and esophagogastrodudononescopy  02-04-12  . IR RADIOLOGIST EVAL & MGMT  01/15/2021  . LUMBAR DISC SURGERY  11/2014    Current Outpatient Medications  Medication Sig Dispense Refill  . amitriptyline (ELAVIL) 25 MG tablet Take 25 mg by mouth at bedtime.    Marland Kitchen atenolol (TENORMIN) 50 MG tablet Take 1 tablet (50 mg total) by mouth daily. 90 tablet 3  . busPIRone (BUSPAR) 15 MG tablet Take 1 tablet (15 mg total) by mouth 3 (three) times daily. 270 tablet 3  . citalopram (CELEXA) 40 MG tablet Take 1 tablet (40 mg total) by mouth daily with breakfast. 90 tablet 3  . famotidine (PEPCID) 20 MG tablet Take 1 tablet (20 mg total) by mouth daily as needed for heartburn or indigestion. 30 tablet   . ibuprofen (ADVIL) 800 MG tablet Take 800 mg by mouth as needed.    . latanoprost (XALATAN) 0.005 % ophthalmic solution Place 1 drop into both eyes daily.    Marland Kitchen lisinopril-hydrochlorothiazide (ZESTORETIC) 20-25 MG tablet TAKE 1 TABLET BY MOUTH EVERY DAY (Patient taking differently: Take 1 tablet by mouth daily.) 90 tablet 2  . ondansetron (ZOFRAN) 4 MG tablet Take 4 mg by mouth every 8 (eight) hours as needed. for nausea    . Oxycodone HCl 10 MG TABS Take 1 tablet (10 mg total) by mouth 2 (two) times daily. (Patient taking differently: Take 10 mg by mouth 4 (four) times daily.)  0  . Polyethylene Glycol 3350 (MIRALAX PO) Take by mouth.    . rosuvastatin (CRESTOR) 20 MG tablet Take 1 tablet (20 mg total) by mouth daily. 90 tablet 3  . tiZANidine (ZANAFLEX) 4 MG tablet Take 4 mg by mouth 3 (three) times daily.    . traZODone (DESYREL) 100 MG tablet TAKE 1 AND 1/2 TABLET (150 MG TOTAL) BY MOUTH AT BEDTIME AS NEEDED FOR SLEEP. 135  tablet 2  . nitroGLYCERIN (NITROSTAT) 0.4 MG SL tablet Place 1 tablet (0.4 mg total) under the tongue every 5 (five) minutes as needed for chest pain. (Patient not taking: Reported on 03/19/2021) 25 tablet 3   No current facility-administered medications for this visit.    Allergies as of 03/19/2021 - Review Complete 03/19/2021  Allergen Reaction Noted  . Nsaids Nausea Only 01/06/2012    Family History  Problem Relation Age of Onset  . Hypertension Mother   . Heart disease Mother   . Lung cancer Father   . Cancer Father        lung  . Colon cancer Paternal Grandmother   . Cancer Paternal Grandmother        colon  . Heart disease Maternal Grandmother   .  Diabetes Sister   . Bipolar disorder Sister   . Heart disease Sister        Congestive Heart Failure  . Hypertension Sister     Social History   Socioeconomic History  . Marital status: Married    Spouse name: Legrand Como  . Number of children: 3  . Years of education: 58  . Highest education level: Associate degree: academic program  Occupational History  . Occupation: Diabled    Employer: UNEMPLOYED  Tobacco Use  . Smoking status: Current Every Day Smoker    Packs/day: 1.00    Years: 12.00    Pack years: 12.00    Types: Cigarettes  . Smokeless tobacco: Never Used  Vaping Use  . Vaping Use: Former  . Quit date: 09/07/2016  . Devices: only used for about a month  Substance and Sexual Activity  . Alcohol use: No    Alcohol/week: 0.0 standard drinks  . Drug use: Yes    Types: Marijuana  . Sexual activity: Yes  Other Topics Concern  . Not on file  Social History Narrative   She lives with her boyfriend.  She used to smoke, quit May 2013, she is not drinking. She has 3 children   Social Determinants of Radio broadcast assistant Strain: Not on file  Food Insecurity: Not on file  Transportation Needs: Not on file  Physical Activity: Not on file  Stress: Not on file  Social Connections: Not on file  Intimate  Partner Violence: Not on file     Physical Exam: BP 122/70 (BP Location: Left Arm, Patient Position: Sitting, Cuff Size: Normal)   Pulse 76   Ht 5' 2.6" (1.59 m) Comment: height measured without shoes  Wt 176 lb (79.8 kg)   BMI 31.58 kg/m  Constitutional: generally well-appearing Psychiatric: alert and oriented x3 Abdomen: soft, nontender, nondistended, no obvious ascites, no peritoneal signs, normal bowel sounds: Left-sided colostomy with brown stool and gas present, nicely healing midline wound.  Really looks more like a wide incision and then secondary intention closure. No peripheral edema noted in lower extremities  Assessment and plan: 60 y.o. female with routine risk for colon cancer, upcoming ostomy takedown  She was quite ill when she presented with sigmoid colon perforation, felt likely due to a stercoral ulcer.  She seems to have recovered fully from that but the original surgery was complicated by postoperative abscess which is not surprising given the feculent peritonitis.  She is hoping to have ostomy takedown soon and understands that she should have full colon cancer screening with a colonoscopy prior to ostomy takedown.  We will arrange that to be done at her soonest convenience.  I see no reason for blood tests or imaging studies prior to then.  Please see the "Patient Instructions" section for addition details about the plan.  Owens Loffler, MD [facility] 03/19/2021, 2:51 PM   Total time on date of encounter was 35 minutes (this included time spent preparing to see the patient reviewing records; obtaining and/or reviewing separately obtained history; performing a medically appropriate exam and/or evaluation; counseling and educating the patient and family if present; ordering medications, tests or procedures if applicable; and documenting clinical information in the health record).

## 2021-03-19 NOTE — Patient Instructions (Signed)
If you are age 62 or younger, your body mass index should be between 19-25. Your Body mass index is 31.58 kg/m. If this is out of the aformentioned range listed, please consider follow up with your Primary Care Provider.   You have been scheduled for a colonoscopy. Please follow written instructions given to you at your visit today.  Please pick up your prep supplies at the pharmacy within the next 1-3 days. If you use inhalers (even only as needed), please bring them with you on the day of your procedure.  Due to recent changes in healthcare laws, you may see the results of your imaging and laboratory studies on MyChart before your provider has had a chance to review them.  We understand that in some cases there may be results that are confusing or concerning to you. Not all laboratory results come back in the same time frame and the provider may be waiting for multiple results in order to interpret others.  Please give [place] 48 hours in order for your provider to thoroughly review all the results before contacting the office for clarification of your results.   Thank you for entrusting me with your care and choosing [facility].  Dr Ardis Hughs

## 2021-03-27 ENCOUNTER — Telehealth: Payer: Self-pay | Admitting: Gastroenterology

## 2021-03-27 NOTE — Telephone Encounter (Signed)
I spoke with the pt and she says she has abd discomfort and wanted to see if she could have EGD at the same time.  I advised her that we would have to ok that with Dr Ardis Hughs and the colon would need to be moved to accommodate if the EGD can be added.  She wants to leave the colon as planned and will speak with Dr Ardis Hughs at the colon procedure.

## 2021-03-27 NOTE — Telephone Encounter (Signed)
Called patient to remind her of her procedure, she stated that she was having severe pain in her stomach, she is wanting to know if she can also have an Endoscopy with her Colonoscopy on Tuesday May 24. Please call patient.

## 2021-04-01 ENCOUNTER — Ambulatory Visit (AMBULATORY_SURGERY_CENTER): Payer: Medicare HMO | Admitting: Gastroenterology

## 2021-04-01 ENCOUNTER — Encounter: Payer: Self-pay | Admitting: Gastroenterology

## 2021-04-01 ENCOUNTER — Other Ambulatory Visit: Payer: Self-pay

## 2021-04-01 VITALS — BP 122/78 | HR 68 | Temp 96.8°F | Resp 14 | Ht 62.6 in | Wt 176.0 lb

## 2021-04-01 DIAGNOSIS — Z933 Colostomy status: Secondary | ICD-10-CM | POA: Diagnosis not present

## 2021-04-01 DIAGNOSIS — Z9049 Acquired absence of other specified parts of digestive tract: Secondary | ICD-10-CM

## 2021-04-01 DIAGNOSIS — Z8719 Personal history of other diseases of the digestive system: Secondary | ICD-10-CM | POA: Diagnosis not present

## 2021-04-01 DIAGNOSIS — Z1211 Encounter for screening for malignant neoplasm of colon: Secondary | ICD-10-CM | POA: Diagnosis not present

## 2021-04-01 MED ORDER — SODIUM CHLORIDE 0.9 % IV SOLN: 500.0000 mL | Freq: Once | INTRAVENOUS | Status: DC

## 2021-04-01 NOTE — Progress Notes (Signed)
A and O x3. Report to RN. Tolerated MAC anesthesia well.

## 2021-04-01 NOTE — Progress Notes (Signed)
CHECK-IN-JB  VITAL SIGNS-[place]

## 2021-04-01 NOTE — Op Note (Signed)
[facility] Patient Name: [name] Procedure Date: 04/01/2021 8:03 AM MRN: [id number] Endoscopist: Milus Banister , MD Age: 60 Referring MD:  Date of Birth: [birth year] Gender: Female Account #: [id number] Procedure:                Colonoscopy Indications:              Screening for colorectal malignant neoplasm;                            Colonoscopy 2013 no polyps; 12/2020 perforated                            sigmoid colon, s/p left colectomy and ostomy                            placement Medicines:                Monitored Anesthesia Care Procedure:                Pre-Anesthesia Assessment:                           - Prior to the procedure, a History and Physical                            was performed, and patient medications and                            allergies were reviewed. The patient's tolerance of                            previous anesthesia was also reviewed. The risks                            and benefits of the procedure and the sedation                            options and risks were discussed with the patient.                            All questions were answered, and informed consent                            was obtained. Prior Anticoagulants: The patient has                            taken no previous anticoagulant or antiplatelet                            agents. ASA Grade Assessment: II - A patient with                            mild systemic disease. After reviewing the risks  and benefits, the patient was deemed in                            satisfactory condition to undergo the procedure.                           After obtaining informed consent, the colonoscope                            was passed under direct vision. Throughout the                            procedure, the patient's blood pressure, pulse, and                            oxygen saturations were monitored continuously. The                             Olympus CF-HQ190L ([id number]) Colonoscope was                            introduced through the anus and advanced to the                            proximal end of the Hartman's pouch and then into                            the ostomy. The colonoscopy was performed without                            difficulty. The patient tolerated the procedure                            well. The quality of the bowel preparation was                            good. The ileocecal valve, appendiceal orifice, and                            rectum were photographed. Scope In: 8:07:45 AM Scope Out: 8:19:50 AM Scope Withdrawal Time: 0 hours 5 minutes 16 seconds  Total Procedure Duration: 0 hours 12 minutes 5 seconds  Findings:                 Via anus:                           1. Hartman's pouch with some mucous balls. Normal                            rectal mucosa.                           Via ostomy:  1. Normal examination.                           No polyps or cancers. Complications:            No immediate complications. Estimated blood loss:                            None. Estimated Blood Loss:     Estimated blood loss: none. Impression:               - The entire examined colon is normal (via anus and                            via ostomy).                           - No polyps or cancers. Recommendation:           - Patient has a contact number available for                            emergencies. The signs and symptoms of potential                            delayed complications were discussed with the                            patient. Return to normal activities tomorrow.                            Written discharge instructions were provided to the                            patient.                           - Resume previous diet.                           - Continue present medications.                           - Repeat colonoscopy in 10 years  for screening                            purposes. Milus Banister, MD 04/01/2021 8:28:31 AM This report has been signed electronically.

## 2021-04-01 NOTE — Patient Instructions (Signed)
Please read handouts provided. Continue present medications. Repeat colonoscopy in 10 years for screening purposes.    YOU HAD AN ENDOSCOPIC PROCEDURE TODAY AT [facility]:   Refer to the procedure report that was given to you for any specific questions about what was found during the examination.  If the procedure report does not answer your questions, please call your gastroenterologist to clarify.  If you requested that your care partner not be given the details of your procedure findings, then the procedure report has been included in a sealed envelope for you to review at your convenience later.  YOU SHOULD EXPECT: Some feelings of bloating in the abdomen. Passage of more gas than usual.  Walking can help get rid of the air that was put into your GI tract during the procedure and reduce the bloating. If you had a lower endoscopy (such as a colonoscopy or flexible sigmoidoscopy) you may notice spotting of blood in your stool or on the toilet paper. If you underwent a bowel prep for your procedure, you may not have a normal bowel movement for a few days.  Please Note:  You might notice some irritation and congestion in your nose or some drainage.  This is from the oxygen used during your procedure.  There is no need for concern and it should clear up in a day or so.  SYMPTOMS TO REPORT IMMEDIATELY:   Following lower endoscopy (colonoscopy or flexible sigmoidoscopy):  Excessive amounts of blood in the stool  Significant tenderness or worsening of abdominal pains  Swelling of the abdomen that is new, acute  Fever of 100F or higher   For urgent or emergent issues, a gastroenterologist can be reached at any hour by calling [phone]. Do not use MyChart messaging for urgent concerns.    DIET:  We do recommend a small meal at first, but then you may proceed to your regular diet.  Drink plenty of fluids but you should avoid alcoholic beverages for 24 hours.  ACTIVITY:   You should plan to take it easy for the rest of today and you should NOT DRIVE or use heavy machinery until tomorrow (because of the sedation medicines used during the test).    FOLLOW UP: Our staff will call the number listed on your records 48-72 hours following your procedure to check on you and address any questions or concerns that you may have regarding the information given to you following your procedure. If we do not reach you, we will leave a message.  We will attempt to reach you two times.  During this call, we will ask if you have developed any symptoms of COVID 19. If you develop any symptoms (ie: fever, flu-like symptoms, shortness of breath, cough etc.) before then, please call [phone].  If you test positive for Covid 19 in the 2 weeks post procedure, please call and report this information to [place].    If any biopsies were taken you will be contacted by phone or by letter within the next 1-3 weeks.  Please call us at [phone] if you have not heard about the biopsies in 3 weeks.    SIGNATURES/CONFIDENTIALITY: You and/or your care partner have signed paperwork which will be entered into your electronic medical record.  These signatures attest to the fact that that the information above on your After Visit Summary has been reviewed and is understood.  Full responsibility of the confidentiality of this discharge information lies with you and/or your care-partner.

## 2021-04-03 ENCOUNTER — Telehealth: Payer: Self-pay | Admitting: *Deleted

## 2021-04-03 NOTE — Telephone Encounter (Signed)
  Follow up Call-  Call back number 04/01/2021  Post procedure Call Back phone  # [phone]  Permission to leave phone message Yes  Some recent data might be hidden     Patient questions:  Do you have a fever, pain , or abdominal swelling? No. Pain Score  0 *  Have you tolerated food without any problems? Yes.    Have you been able to return to your normal activities? Yes.    Do you have any questions about your discharge instructions: Diet   No. Medications  No. Follow up visit  No.  Do you have questions or concerns about your Care? No.  Actions: * If pain score is 4 or above: No action needed, pain <4.  1. Have you developed a fever since your procedure? no  2.   Have you had an respiratory symptoms (SOB or cough) since your procedure? no  3.   Have you tested positive for COVID 19 since your procedure no  4.   Have you had any family members/close contacts diagnosed with the COVID 19 since your procedure? no   If yes to any of these questions please route to Joylene John, RN and Joella Prince, RN

## 2021-04-14 DIAGNOSIS — Z933 Colostomy status: Secondary | ICD-10-CM | POA: Diagnosis not present

## 2021-04-15 ENCOUNTER — Ambulatory Visit: Payer: Medicare HMO | Admitting: General Surgery

## 2021-04-15 ENCOUNTER — Encounter: Payer: Self-pay | Admitting: General Surgery

## 2021-04-15 ENCOUNTER — Other Ambulatory Visit: Payer: Self-pay

## 2021-04-15 VITALS — BP 137/83 | HR 79 | Temp 98.5°F | Resp 18 | Ht 64.0 in | Wt 171.0 lb

## 2021-04-15 DIAGNOSIS — Z933 Colostomy status: Secondary | ICD-10-CM

## 2021-04-15 DIAGNOSIS — K435 Parastomal hernia without obstruction or  gangrene: Secondary | ICD-10-CM

## 2021-04-15 MED ORDER — DULCOLAX 5 MG PO TBEC
20.0000 mg | DELAYED_RELEASE_TABLET | ORAL | 0 refills | Status: DC
Start: 2021-04-15 — End: 2021-04-28

## 2021-04-15 MED ORDER — NEOMYCIN SULFATE 500 MG PO TABS: 1000.0000 mg | ORAL_TABLET | ORAL | 0 refills | Status: DC

## 2021-04-15 MED ORDER — METRONIDAZOLE 500 MG PO TABS
1000.0000 mg | ORAL_TABLET | ORAL | 0 refills | Status: DC
Start: 2021-04-15 — End: 2021-04-28

## 2021-04-15 NOTE — Patient Instructions (Addendum)
Colon Preparation Buy from the Store: Miralax bottle [phone]).  Gatorade 64 oz (not red). Dulcolax tablets.   The Day Prior to Surgery: Take 4 ducolax tablets at 7am with water. Do an enema through your rectum at 8AM. Drink plenty of clear liquids all day to avoid dehydration, no solid food.    Mix the bottle of Miralax and 64 oz of Gatorade and drink this mixture starting at 10am.  Drink it gradually over the next few hours, 8 ounces every 15-30 minutes until it is gone. Finish this by 2pm.  Repeat an enema at 2pm.  Take 2 neomycin 500mg  tablets and 2 metronidazole 500mg  tablets at 2 pm. Take 2 neomycin 500mg  tablets and 2 metronidazole 500mg  tablets at 3pm. Take 2 neomycin 500mg  tablets and 2 metronidazole 500mg  tablets at 10pm.    Do not eat or drink anything after midnight the night before your surgery.  Do not eat or drink anything that morning, and take medications as instructed by the hospital staff on your preoperative visit.    Colostomy Reversal Surgery A colostomy reversal is a surgical procedure that is done to reverse a colostomy. In this reversal procedure, the large intestine is disconnected from the opening in the abdomen (stoma). Then, the changes that were made to the intestine during the colostomy will be reversed to restore the flow of stool through the entire intestine. Depending on the type of colostomy being reversed, this may involve one of the following:  Reconnecting the two ends of the intestine that were separated during colostomy surgery.  Closing the opening that was made in the side of the intestine to allow stool to be redirected through the stoma. After this surgery, a stoma and colostomy bag are no longer needed. Stool (feces) can leave your body through the rectum, as it did before you had a colostomy. Tell a health care provider about:  Any allergies you have.  All medicines you are taking, including vitamins, herbs, eye drops, creams, and  over-the-counter medicines.  Any problems you or family members have had with anesthetic medicines.  Any blood disorders you have.  Any surgeries you have had.  Any medical conditions you have.  Whether you are pregnant or may be pregnant. What are the risks? Generally, this is a safe procedure. However, problems may occur, including:  Infection.  Bleeding.  Allergic reactions to medicines.  Damage to other structures or organs.  A temporary condition in which the intestines stop moving and working correctly (ileus). This usually goes away in 3-7 days.  A collection of pus (abscess) in the abdomen or pelvis.  Intestinal blockage.  Leaking at the area of the intestine where it was reconnected (anastomotic leak) or where the opening of the stoma was closed.  Narrowing of the intestine (stricture) at the place where it was reconnected.  Urinary and sexual dysfunction. What happens before the procedure? Medicines Ask your health care provider about:  Changing or stopping your regular medicines. This is especially important if you are taking diabetes medicines or blood thinners.  Taking medicines such as aspirin and ibuprofen. These medicines can thin your blood. Do not take these medicines unless your health care provider tells you to take them.  Taking over-the-counter medicines, vitamins, herbs, and supplements. General instructions  You may have an exam or testing.  Plan to have someone take you home after the procedure.  Plan to have a responsible adult care for you for at least 24 hours after you leave the  hospital or clinic. This is important.  Do not use any products that contain nicotine or tobacco, such as cigarettes, e-cigarettes, and chewing tobacco. These can delay incision healing after surgery. If you need help quitting, ask your health care provider.  Ask your health care provider what steps will be taken to help prevent infection. These may  include: ? Removing hair at the surgery site. ? Washing skin with a germ-killing soap. ? Antibiotic medicine. What happens during the procedure?  An IV will be inserted into one of your veins.  You may be given: ? A medicine to help you relax (sedative). ? A medicine to make you fall asleep (general anesthetic).  An incision will be made in your abdomen at the site of the stoma.  The large intestine will be disconnected from the abdomen at the site of the stoma.  The next steps will vary depending on the type of colostomy reversal surgery you are having. There are two main types: ? End colostomy reversal. The surgeon will use stitches (sutures) or staples to reconnect the two ends of the intestine that were separated during the end colostomy. ? Loop colostomy reversal. The surgeon will use sutures or staples to close the opening in the intestine that had been allowing stool to be redirected through the stoma. The intestine will then be put back into its normal position inside the abdomen.  The incision will be closed with sutures, skin glue, or adhesive strips. It may be covered with bandages (dressings). The procedure may vary among health care providers and hospitals.   What happens after the procedure?  Your blood pressure, heart rate, breathing rate, and blood oxygen level will be monitored until you leave the hospital or clinic.  You will be given pain medicine as needed.  You will slowly increase your diet and movement as told by your health care provider. Summary  A colostomy reversal is a surgical procedure that is done to reverse a colostomy. After this surgery, stool (feces) can leave your body through the rectum, as it did before you had a colostomy.  Before the procedure, follow instructions from your health care provider about taking medicines and about eating and drinking.  During the procedure, your colostomy will be reversed, and the incision will be closed with  sutures, skin glue, or adhesive strips. It may be covered with bandages (dressings).  After the procedure, you will slowly increase your diet and movement as told by your health care provider. This information is not intended to replace advice given to you by your health care provider. Make sure you discuss any questions you have with your health care provider. Document Revised: 04/13/2018 Document Reviewed: 04/13/2018 Elsevier Patient Education  [place].

## 2021-04-15 NOTE — Progress Notes (Signed)
[facility] History and Physical  Reason for Referral: End colostomy    Chief Complaint     Other       [name] is a 60 y.o. female.  HPI: [name] is well known to me with an end colostomy in place since February after a perforated stercoral ulcer. She has done well and has healed her midline incision. She has had her colonoscopy and no major issues were found.  She does have some swelling on the left side near her ostomy and says it is painful there at times. She has regular Bms and is eating a regular diet.  She reports that she is ready to get her coloscopy reversed as soon as it can be done. She denies any chest pain or SOB. She has no cardiac history.   Past Medical History:  Diagnosis Date   Abdominal pain    Allergy    seasonal   Anxiety    Arthritis    Blood in stool    Breast changes, fibrocystic    Bruises easily    Cardiomegaly 2018   Clotting disorder (HCC)     itp , none since 1976, no current hematologist   Constipation    DDD (degenerative disc disease), cervical    with lumbar issues   Depression    Generalized headaches    GERD (gastroesophageal reflux disease)    Glaucoma    Hemorrhoid    Hyperlipidemia    Hypertension    Idiopathic thrombocytopenia (HCC)    as child   Leg swelling    both ankles   Liver lesion    Nasal congestion    Nausea & vomiting    Neuromuscular disorder (HCC)    back  injury   Neuromuscular disorder ([place])    3 neck surgeries,neck fusion.pin,plates,screws   Osteoporosis    Panic attacks    pt also  has germaphobia   Rectal bleeding    Seizures ([place]) 1998   stress induced, one time, none since, no seizure meds   Seizures ([place])    Stated she had 2 weeks ago,it was like a transient,stare.04/01/21 pt. denies   Sleep apnea    STOPBANG=4   Spleen enlarged 1976, none since   r/t idiopathic thrombocytopenia   Trouble swallowing    Unintentional weight loss    Weakness    weakness varies     Past Surgical History:  Procedure Laterality Date   ABDOMINAL HYSTERECTOMY  1997   BACK SURGERY     CERVICAL SPINE SURGERY  2005, 2007, 2010   x3, fusion done,plates and screws present   [place]  03/14/2012   Procedure: LAPAROSCOPIC CHOLECYSTECTOMY;  Surgeon: Stark Klein, MD;  Location: [facility];  Service: General;  Laterality: N/A;   COLECTOMY WITH COLOSTOMY CREATION/HARTMANN PROCEDURE N/A 12/21/2020   Procedure: COLECTOMY WITH COLOSTOMY CREATION/HARTMANN PROCEDURE;  Surgeon: Virl Cagey, MD;  Location: [facility];  Service: General;  Laterality: N/A;   colonscopy and esophagogastrodudononescopy  02-04-12   IR RADIOLOGIST EVAL & MGMT  01/15/2021   LUMBAR [place] SURGERY  11/2014    Family History  Problem Relation Age of Onset   Hypertension Mother    Heart disease Mother    Lung cancer Father    Cancer Father        lung   Colon cancer Paternal Grandmother    Cancer Paternal Grandmother        colon   Heart  disease Maternal Grandmother    Diabetes Sister    Bipolar disorder Sister    Heart disease Sister        Congestive Heart Failure   Hypertension Sister    Esophageal cancer Neg Hx    Rectal cancer Neg Hx    Stomach cancer Neg Hx     Social History   Tobacco Use   Smoking status: Current Every Day Smoker    Packs/day: 1.00    Years: 12.00    Pack years: 12.00    Types: Cigarettes   Smokeless tobacco: Never Used  Vaping Use   Vaping Use: Former   Quit date: 09/07/2016   Devices: only used for about a month  Substance Use Topics   Alcohol use: No    Alcohol/week: 0.0 standard drinks   Drug use: Yes    Types: Marijuana    Medications: I have reviewed the patient's current medications. Allergies as of 04/15/2021       Reactions   Nsaids Nausea Only   Abdominal pain        Medication List        Accurate as of April 15, 2021 11:38 AM. If you have any questions, ask your nurse or doctor.           amitriptyline 25 MG tablet Commonly known as: ELAVIL Take 25 mg by mouth at bedtime.   atenolol 50 MG tablet Commonly known as: TENORMIN Take 1 tablet (50 mg total) by mouth daily.   busPIRone 15 MG tablet Commonly known as: BUSPAR Take 1 tablet (15 mg total) by mouth 3 (three) times daily.   citalopram 40 MG tablet Commonly known as: CELEXA Take 1 tablet (40 mg total) by mouth daily with breakfast.   famotidine 20 MG tablet Commonly known as: PEPCID Take 1 tablet (20 mg total) by mouth daily as needed for heartburn or indigestion.   ibuprofen 800 MG tablet Commonly known as: ADVIL Take 800 mg by mouth as needed.   latanoprost 0.005 % ophthalmic solution Commonly known as: XALATAN Place 1 drop into both eyes daily.   lisinopril-hydrochlorothiazide 20-25 MG tablet Commonly known as: ZESTORETIC TAKE 1 TABLET BY MOUTH EVERY DAY   MIRALAX PO Take by mouth.   nitroGLYCERIN 0.4 MG SL tablet Commonly known as: NITROSTAT Place 1 tablet (0.4 mg total) under the tongue every 5 (five) minutes as needed for chest pain.   ondansetron 4 MG tablet Commonly known as: ZOFRAN Take 4 mg by mouth every 8 (eight) hours as needed. for nausea   Oxycodone HCl 10 MG Tabs Take 1 tablet (10 mg total) by mouth 2 (two) times daily. What changed: when to take this   rosuvastatin 20 MG tablet Commonly known as: Crestor Take 1 tablet (20 mg total) by mouth daily.   tiZANidine 4 MG tablet Commonly known as: ZANAFLEX Take 4 mg by mouth 3 (three) times daily.   traZODone 100 MG tablet Commonly known as: DESYREL TAKE 1 AND 1/2 TABLET (150 MG TOTAL) BY MOUTH AT BEDTIME AS NEEDED FOR SLEEP.         ROS:  A comprehensive review of systems was negative except for: Gastrointestinal: positive for abdominal pain and tender around ostomy site/ bulging  Blood pressure 137/83, pulse 79, temperature 98.5 F (36.9 C), temperature source Other (Comment), resp. rate 18, height 5\' 4"  (1.626 m),  weight 171 lb (77.6 kg), SpO2 97 %. Physical Exam Vitals reviewed.  Constitutional:      Appearance: Normal  appearance.  HENT:     Head: Normocephalic.     Nose: Nose normal.  Eyes:     Extraocular Movements: Extraocular movements intact.  Cardiovascular:     Rate and Rhythm: Normal rate and regular rhythm.  Pulmonary:     Effort: Pulmonary effort is normal.     Breath sounds: Normal breath sounds.  Abdominal:     General: There is no distension.     Palpations: Abdomen is soft.     Tenderness: There is abdominal tenderness.     Comments: Ostomy LLQ and some bulging, consistent with parastomal hernia  Musculoskeletal:        General: Normal range of motion.     Cervical back: Normal range of motion.  Skin:    General: Skin is warm.  Neurological:     General: No focal deficit present.     Mental Status: She is alert and oriented to person, place, and time.  Psychiatric:        Mood and Affect: Mood normal.        Behavior: Behavior normal.        Thought Content: Thought content normal.        Judgment: Judgment normal.    Results: CT 01/2021- parastomal hernia around ostomy, long stump  Assessment & Plan:  LOISTINE EBERLIN is a 60 y.o. female with an end colostomy after stercoral ulcer, parastomal hernia. She wants to get her ostomy reversed.  Discussed reversal and risk of bleeding, infection, anastomotic leak, needing another ostomy, injury to other organs, ureter, and risk of hernia after the procedure. Discussed preop testing and COVID testing. Discussed expected hospital course.   All questions were answered to the satisfaction of the patient.  Colon preparation:  Buy from the Store: Miralax bottle (288g).  Gatorade 64 oz (not red). Dulcolax tablets.   The Day Prior to Surgery: Take 4 ducolax tablets at 7am with water. Do an enema through your rectum at 8AM. Drink plenty of clear liquids all day to avoid dehydration, no solid food.    Mix the bottle of  Miralax and 64 oz of Gatorade and drink this mixture starting at 10am.  Drink it gradually over the next few hours, 8 ounces every 15-30 minutes until it is gone. Finish this by 2pm.  Repeat an enema at 2pm.  Take 2 neomycin 500mg  tablets and 2 metronidazole 500mg  tablets at 2 pm. Take 2 neomycin 500mg  tablets and 2 metronidazole 500mg  tablets at 3pm. Take 2 neomycin 500mg  tablets and 2 metronidazole 500mg  tablets at 10pm.    Do not eat or drink anything after midnight the night before your surgery.  Do not eat or drink anything that morning, and take medications as instructed by the hospital staff on your preoperative visit.     Virl Cagey 04/15/2021, 11:38 AM

## 2021-04-16 DIAGNOSIS — M4322 Fusion of spine, cervical region: Secondary | ICD-10-CM | POA: Diagnosis not present

## 2021-04-16 DIAGNOSIS — M542 Cervicalgia: Secondary | ICD-10-CM | POA: Diagnosis not present

## 2021-04-16 DIAGNOSIS — G894 Chronic pain syndrome: Secondary | ICD-10-CM | POA: Diagnosis not present

## 2021-04-18 DIAGNOSIS — K435 Parastomal hernia without obstruction or  gangrene: Secondary | ICD-10-CM | POA: Insufficient documentation

## 2021-04-18 HISTORY — DX: Parastomal hernia without obstruction or gangrene: K43.5

## 2021-04-18 NOTE — H&P (Signed)
[facility] History and Physical  Reason for Referral: End colostomy    Chief Complaint     Other       [name] is a 60 y.o. female.  HPI: [name] is well known to me with an end colostomy in place since February after a perforated stercoral ulcer. She has done well and has healed her midline incision. She has had her colonoscopy and no major issues were found.  She does have some swelling on the left side near her ostomy and says it is painful there at times. She has regular Bms and is eating a regular diet.  She reports that she is ready to get her coloscopy reversed as soon as it can be done. She denies any chest pain or SOB. She has no cardiac history.   Past Medical History:  Diagnosis Date   Abdominal pain    Allergy    seasonal   Anxiety    Arthritis    Blood in stool    Breast changes, fibrocystic    Bruises easily    Cardiomegaly 2018   Clotting disorder (HCC)     itp , none since 1976, no current hematologist   Constipation    DDD (degenerative disc disease), cervical    with lumbar issues   Depression    Generalized headaches    GERD (gastroesophageal reflux disease)    Glaucoma    Hemorrhoid    Hyperlipidemia    Hypertension    Idiopathic thrombocytopenia (HCC)    as child   Leg swelling    both ankles   Liver lesion    Nasal congestion    Nausea & vomiting    Neuromuscular disorder (HCC)    back  injury   Neuromuscular disorder ([place])    3 neck surgeries,neck fusion.pin,plates,screws   Osteoporosis    Panic attacks    pt also  has germaphobia   Rectal bleeding    Seizures ([place]) 1998   stress induced, one time, none since, no seizure meds   Seizures ([place])    Stated she had 2 weeks ago,it was like a transient,stare.04/01/21 pt. denies   Sleep apnea    STOPBANG=4   Spleen enlarged 1976, none since   r/t idiopathic thrombocytopenia   Trouble swallowing    Unintentional weight loss    Weakness    weakness varies     Past Surgical History:  Procedure Laterality Date   ABDOMINAL HYSTERECTOMY  1997   BACK SURGERY     CERVICAL SPINE SURGERY  2005, 2007, 2010   x3, fusion done,plates and screws present   [place]  03/14/2012   Procedure: LAPAROSCOPIC CHOLECYSTECTOMY;  Surgeon: Stark Klein, MD;  Location: [facility];  Service: General;  Laterality: N/A;   COLECTOMY WITH COLOSTOMY CREATION/HARTMANN PROCEDURE N/A 12/21/2020   Procedure: COLECTOMY WITH COLOSTOMY CREATION/HARTMANN PROCEDURE;  Surgeon: Virl Cagey, MD;  Location: [facility];  Service: General;  Laterality: N/A;   colonscopy and esophagogastrodudononescopy  02-04-12   IR RADIOLOGIST EVAL & MGMT  01/15/2021   LUMBAR [place] SURGERY  11/2014    Family History  Problem Relation Age of Onset   Hypertension Mother    Heart disease Mother    Lung cancer Father    Cancer Father        lung   Colon cancer Paternal Grandmother    Cancer Paternal Grandmother        colon   Heart  disease Maternal Grandmother    Diabetes Sister    Bipolar disorder Sister    Heart disease Sister        Congestive Heart Failure   Hypertension Sister    Esophageal cancer Neg Hx    Rectal cancer Neg Hx    Stomach cancer Neg Hx     Social History   Tobacco Use   Smoking status: Current Every Day Smoker    Packs/day: 1.00    Years: 12.00    Pack years: 12.00    Types: Cigarettes   Smokeless tobacco: Never Used  Vaping Use   Vaping Use: Former   Quit date: 09/07/2016   Devices: only used for about a month  Substance Use Topics   Alcohol use: No    Alcohol/week: 0.0 standard drinks   Drug use: Yes    Types: Marijuana    Medications: I have reviewed the patient's current medications. Allergies as of 04/15/2021       Reactions   Nsaids Nausea Only   Abdominal pain        Medication List        Accurate as of April 15, 2021 11:38 AM. If you have any questions, ask your nurse or doctor.           amitriptyline 25 MG tablet Commonly known as: ELAVIL Take 25 mg by mouth at bedtime.   atenolol 50 MG tablet Commonly known as: TENORMIN Take 1 tablet (50 mg total) by mouth daily.   busPIRone 15 MG tablet Commonly known as: BUSPAR Take 1 tablet (15 mg total) by mouth 3 (three) times daily.   citalopram 40 MG tablet Commonly known as: CELEXA Take 1 tablet (40 mg total) by mouth daily with breakfast.   famotidine 20 MG tablet Commonly known as: PEPCID Take 1 tablet (20 mg total) by mouth daily as needed for heartburn or indigestion.   ibuprofen 800 MG tablet Commonly known as: ADVIL Take 800 mg by mouth as needed.   latanoprost 0.005 % ophthalmic solution Commonly known as: XALATAN Place 1 drop into both eyes daily.   lisinopril-hydrochlorothiazide 20-25 MG tablet Commonly known as: ZESTORETIC TAKE 1 TABLET BY MOUTH EVERY DAY   MIRALAX PO Take by mouth.   nitroGLYCERIN 0.4 MG SL tablet Commonly known as: NITROSTAT Place 1 tablet (0.4 mg total) under the tongue every 5 (five) minutes as needed for chest pain.   ondansetron 4 MG tablet Commonly known as: ZOFRAN Take 4 mg by mouth every 8 (eight) hours as needed. for nausea   Oxycodone HCl 10 MG Tabs Take 1 tablet (10 mg total) by mouth 2 (two) times daily. What changed: when to take this   rosuvastatin 20 MG tablet Commonly known as: Crestor Take 1 tablet (20 mg total) by mouth daily.   tiZANidine 4 MG tablet Commonly known as: ZANAFLEX Take 4 mg by mouth 3 (three) times daily.   traZODone 100 MG tablet Commonly known as: DESYREL TAKE 1 AND 1/2 TABLET (150 MG TOTAL) BY MOUTH AT BEDTIME AS NEEDED FOR SLEEP.         ROS:  A comprehensive review of systems was negative except for: Gastrointestinal: positive for abdominal pain and tender around ostomy site/ bulging  Blood pressure 137/83, pulse 79, temperature 98.5 F (36.9 C), temperature source Other (Comment), resp. rate 18, height 5\' 4"  (1.626 m),  weight 171 lb (77.6 kg), SpO2 97 %. Physical Exam Vitals reviewed.  Constitutional:      Appearance: Normal  appearance.  HENT:     Head: Normocephalic.     Nose: Nose normal.  Eyes:     Extraocular Movements: Extraocular movements intact.  Cardiovascular:     Rate and Rhythm: Normal rate and regular rhythm.  Pulmonary:     Effort: Pulmonary effort is normal.     Breath sounds: Normal breath sounds.  Abdominal:     General: There is no distension.     Palpations: Abdomen is soft.     Tenderness: There is abdominal tenderness.     Comments: Ostomy LLQ and some bulging, consistent with parastomal hernia  Musculoskeletal:        General: Normal range of motion.     Cervical back: Normal range of motion.  Skin:    General: Skin is warm.  Neurological:     General: No focal deficit present.     Mental Status: She is alert and oriented to person, place, and time.  Psychiatric:        Mood and Affect: Mood normal.        Behavior: Behavior normal.        Thought Content: Thought content normal.        Judgment: Judgment normal.    Results: CT 01/2021- parastomal hernia around ostomy, long stump  Assessment & Plan:  [name] is a 60 y.o. female with an end colostomy after stercoral ulcer, parastomal hernia. She wants to get her ostomy reversed.  Discussed reversal and risk of bleeding, infection, anastomotic leak, needing another ostomy, injury to other organs, ureter, and risk of hernia after the procedure. Discussed preop testing and COVID testing. Discussed expected hospital course.   All questions were answered to the satisfaction of the patient.  Colon preparation:  Buy from the Store: Miralax bottle (288g).  Gatorade 64 oz (not red). Dulcolax tablets.   The Day Prior to Surgery: Take 4 ducolax tablets at 7am with water. Do an enema through your rectum at 8AM. Drink plenty of clear liquids all day to avoid dehydration, no solid food.    Mix the bottle of  Miralax and 64 oz of Gatorade and drink this mixture starting at 10am.  Drink it gradually over the next few hours, 8 ounces every 15-30 minutes until it is gone. Finish this by 2pm.  Repeat an enema at 2pm.  Take 2 neomycin 500mg  tablets and 2 metronidazole 500mg  tablets at 2 pm. Take 2 neomycin 500mg  tablets and 2 metronidazole 500mg  tablets at 3pm. Take 2 neomycin 500mg  tablets and 2 metronidazole 500mg  tablets at 10pm.    Do not eat or drink anything after midnight the night before your surgery.  Do not eat or drink anything that morning, and take medications as instructed by the hospital staff on your preoperative visit.     Virl Cagey 04/15/2021, 11:38 AM

## 2021-04-23 NOTE — Patient Instructions (Signed)
[name]  04/23/2021     @PREFPERIOPPHARMACY @   Your procedure is scheduled on 04/28/2021.   Report to [name] at  [phone]  A.M.  Call this number if you have problems the morning of surgery:  [name]  04/23/2021     @PREFPERIOPPHARMACY @   Your procedure is scheduled on 04/28/2021.   Report to [name] at [phone]  A.M.   Call this number if you have problems the morning of surgery:  [phone]   Remember:  Do not eat any solid food 04/27/2021.   Drink 2 bottles of carb drink right before bed 04/27/2021.   You may drink clear liquids until 0330 AM 04/28/2021 .  Clear liquids allowed are:                    Water, Juice (non-citric and without pulp - diabetics please choose diet or no sugar options), Carbonated beverages - (diabetics please choose diet or no sugar options), Clear Tea, Black Coffee only (no creamer, milk or cream including half and half), Plain Jell-O only (diabetics please choose diet or no sugar options), Gatorade (diabetics please choose diet or no sugar options), and Plain Popsicles only  At 0330 am, drink 1 bottle of carb drink. Nothing else to drink after this except for as little water as you can to safely take your medications.    Take these medicines the morning of surgery with A SIP OF WATER     Elavil(if needed), atenolol, buspar, zyrtec, celexa, pepcid, zofran (if needed), zanaflex (if needed).       Please brush your teeth.  Do not wear jewelry, make-up or nail polish.  Do not wear lotions, powders, or perfumes, or deodorant.  Do not shave 48 hours prior to surgery.  Men may shave face and neck.  Do not bring valuables to the hospital.  [name] is not responsible for any belongings or valuables.  Contacts, dentures or bridgework may not be worn into surgery.  Leave your suitcase in the car.  After surgery it may be brought to your room.  For patients admitted to the hospital, discharge time  will be determined by your treatment team.  Patients discharged the day of surgery will not be allowed to drive home and must have someone with them for 24 hours.    Special instructions:  DO NOT smoke tobacco or vape for 24 hours before your surgery.  Please read over the following fact sheets that you were given. Pain Booklet, Coughing and Deep Breathing, Blood Transfusion Information, Surgical Site Infection Prevention, Anesthesia Post-op Instructions, and Care and Recovery After Surgery            Colostomy Reversal Surgery, Care After This sheet gives you information about how to care for yourself after your procedure. Your health care provider may also give you more specific instructions. If you have problems or questions, contact your health careprovider. What can I expect after the procedure? After the procedure, it is common to have: Pain and discomfort in your abdomen, especially near your incision. Loose stools (diarrhea). Decreased appetite. Constipation. Follow these instructions at home: Activity  Do not lift anything that is heavier than 10 lb (4.5 kg), or the limit that you are told, until your health care provider says that it is safe. Return to your normal activities as told by your health care provider. Ask your health care  provider what activities are safe for you. Avoid sitting for a long time without moving. Get up to take short walks every 1-2 hours. This is important to improve blood flow and breathing. Ask for help if you feel weak or unsteady. Avoid strenuous activity, contact sports, and abdominal exercises for 4 weeks or as long as told by your health care provider.  Incision care  Follow instructions from your health care provider about how to take care of your incision. Make sure you: Wash your hands with soap and water before and after you change your bandage (dressing). If soap and water are not available, use hand sanitizer. Change your dressing as  told by your health care provider. Leave stitches (sutures), skin glue, or adhesive strips in place. These skin closures may need to stay in place for 2 weeks or longer. If adhesive strip edges start to loosen and curl up, you may trim the loose edges. Do not remove adhesive strips completely unless your health care provider tells you to do that. Keep the incision area clean and dry. Check your incision area every day for signs of infection. Check for: More redness, swelling, or pain. More fluid or blood. Warmth. Pus or a bad smell.  Bathing Do not take baths, swim, or use a hot tub until your health care provider approves. Ask your health care provider if you may take showers. You may only be allowed to take sponge baths. If your health care provider approves bathing and showering, cover the dressing with a watertight covering to protect it from water. Do not let the dressing get wet. Keep the dressing dry until your health care provider says it can be removed. Driving Do not drive for 24 hours if you were given a sedative during your procedure. Follow other driving restrictions as told by your health care provider. Do not drive or use heavy machinery while taking prescription pain medicine. Eating and drinking Follow instructions from your health care provider about eating or drinking restrictions. This may include: What to eat and drink. You may be told to start eating a bland diet. Over time, you may slowly resume a more normal, healthy diet. How much to eat and drink. You should eat small meals often and stop eating when you feel full. Take nutrition supplements as told by your health care provider or dietitian. General instructions Take over-the-counter and prescription medicines only as told by your health care provider. Take steps to treat diarrhea or constipation as told by your health care provider. Your health care provider may recommend that you: Drink enough fluid to keep your  urine pale yellow. Avoid fluids that contain a lot of sugar or caffeine, such as energy drinks, sports drinks, and soda. Eat bland, easy-to-digest foods in small amounts as you are able. These foods include bananas, applesauce, rice, lean meats, toast, and crackers. Take over-the-counter or prescription medicines. Limit foods that are high in fat and processed sugars, such as fried or sweet foods. Do not use any products that contain nicotine or tobacco, such as cigarettes, e-cigarettes, and chewing tobacco. These can delay incision healing after surgery. If you need help quitting, ask your health care provider. Keep all follow-up visits as told by your health care provider. This is important. Contact a health care provider if: You have more redness, swelling, or pain at the site of your incision. You have more fluid or blood coming from your incision. Your incision feels warm to the touch. You have pus or  a bad smell coming from your incision. You have a fever. Your incision breaks open. You feel nauseous. You are not able to have a bowel movement (are constipated). Your diarrhea gets worse. You have pain that is not controlled with medicine. Get help right away if you have: Abdominal pain that does not go away or becomes severe. Frequent vomiting and you are not able to eat or drink. Difficulty breathing. Summary After colostomy reversal surgery, it is common to have abdominal pain, decreased appetite, diarrhea, or constipation. Follow instructions from your health care provider about how to take care of your incision. Do not let the dressing get wet. Take over-the-counter and prescription medicines only as told by your health care provider. Contact your health care provider if you are not able to have a bowel movement (are constipated). Keep all follow-up visits as told by your health care provider. This is important. This information is not intended to replace advice given to you by  your health care provider. Make sure you discuss any questions you have with your healthcare provider. Document Revised: 04/13/2018 Document Reviewed: 04/13/2018 Elsevier Patient Education  [place] Anesthesia, Adult, Care After This sheet gives you information about how to care for yourself after your procedure. Your health care provider may also give you more specific instructions. If you have problems or questions, contact your health careprovider. What can I expect after the procedure? After the procedure, the following side effects are common: Pain or discomfort at the IV site. Nausea. Vomiting. Sore throat. Trouble concentrating. Feeling cold or chills. Feeling weak or tired. Sleepiness and fatigue. Soreness and body aches. These side effects can affect parts of the body that were not involved in surgery. Follow these instructions at home: For the time period you were told by your health care provider:  Rest. Do not participate in activities where you could fall or become injured. Do not drive or use machinery. Do not drink alcohol. Do not take sleeping pills or medicines that cause drowsiness. Do not make important decisions or sign legal documents. Do not take care of children on your own.  Eating and drinking Follow any instructions from your health care provider about eating or drinking restrictions. When you feel hungry, start by eating small amounts of foods that are soft and easy to digest (bland), such as toast. Gradually return to your regular diet. Drink enough fluid to keep your urine pale yellow. If you vomit, rehydrate by drinking water, juice, or clear broth. General instructions If you have sleep apnea, surgery and certain medicines can increase your risk for breathing problems. Follow instructions from your health care provider about wearing your sleep device: Anytime you are sleeping, including during daytime naps. While taking prescription  pain medicines, sleeping medicines, or medicines that make you drowsy. Have a responsible adult stay with you for the time you are told. It is important to have someone help care for you until you are awake and alert. Return to your normal activities as told by your health care provider. Ask your health care provider what activities are safe for you. Take over-the-counter and prescription medicines only as told by your health care provider. If you smoke, do not smoke without supervision. Keep all follow-up visits as told by your health care provider. This is important. Contact a health care provider if: You have nausea or vomiting that does not get better with medicine. You cannot eat or drink without vomiting. You have pain that does not  get better with medicine. You are unable to pass urine. You develop a skin rash. You have a fever. You have redness around your IV site that gets worse. Get help right away if: You have difficulty breathing. You have chest pain. You have blood in your urine or stool, or you vomit blood. Summary After the procedure, it is common to have a sore throat or nausea. It is also common to feel tired. Have a responsible adult stay with you for the time you are told. It is important to have someone help care for you until you are awake and alert. When you feel hungry, start by eating small amounts of foods that are soft and easy to digest (bland), such as toast. Gradually return to your regular diet. Drink enough fluid to keep your urine pale yellow. Return to your normal activities as told by your health care provider. Ask your health care provider what activities are safe for you. This information is not intended to replace advice given to you by your health care provider. Make sure you discuss any questions you have with your healthcare provider. Document Revised: 07/11/2020 Document Reviewed: 02/08/2020 Elsevier Patient Education  2022 [place]. How to Use  Chlorhexidine for Bathing Chlorhexidine gluconate (CHG) is a germ-killing (antiseptic) solution that is used to clean the skin. It can get rid of the bacteria that normally live on the skin and can keep them away for about 24 hours. To clean your skin with CHG, you may be given: A CHG solution to use in the shower or as part of a sponge bath. A prepackaged cloth that contains CHG. Cleaning your skin with CHG may help lower the risk for infection: While you are staying in the intensive care unit of the hospital. If you have a vascular access, such as a central line, to provide short-term or long-term access to your veins. If you have a catheter to drain urine from your bladder. If you are on a ventilator. A ventilator is a machine that helps you breathe by moving air in and out of your lungs. After surgery. What are the risks? Risks of using CHG include: A skin reaction. Hearing loss, if CHG gets in your ears. Eye injury, if CHG gets in your eyes and is not rinsed out. The CHG product catching fire. Make sure that you avoid smoking and flames after applying CHG to your skin. Do not use CHG: If you have a chlorhexidine allergy or have previously reacted to chlorhexidine. On babies younger than 35 months of age. How to use CHG solution Use CHG only as told by your health care provider, and follow the instructions on the label. Use the full amount of CHG as directed. Usually, this is one bottle. During a shower Follow these steps when using CHG solution during a shower (unless your health care provider gives you different instructions): Start the shower. Use your normal soap and shampoo to wash your face and hair. Turn off the shower or move out of the shower stream. Pour the CHG onto a clean washcloth. Do not use any type of brush or rough-edged sponge. Starting at your neck, lather your body down to your toes. Make sure you follow these instructions: If you will be having surgery, pay  special attention to the part of your body where you will be having surgery. Scrub this area for at least 1 minute. Do not use CHG on your head or face. If the solution gets into your ears  or eyes, rinse them well with water. Avoid your genital area. Avoid any areas of skin that have broken skin, cuts, or scrapes. Scrub your back and under your arms. Make sure to wash skin folds. Let the lather sit on your skin for 1-2 minutes or as long as told by your health care provider. Thoroughly rinse your entire body in the shower. Make sure that all body creases and crevices are rinsed well. Dry off with a clean towel. Do not put any substances on your body afterward--such as powder, lotion, or perfume--unless you are told to do so by your health care provider. Only use lotions that are recommended by the manufacturer. Put on clean clothes or pajamas. If it is the night before your surgery, sleep in clean sheets.  During a sponge bath Follow these steps when using CHG solution during a sponge bath (unless your health care provider gives you different instructions): Use your normal soap and shampoo to wash your face and hair. Pour the CHG onto a clean washcloth. Starting at your neck, lather your body down to your toes. Make sure you follow these instructions: If you will be having surgery, pay special attention to the part of your body where you will be having surgery. Scrub this area for at least 1 minute. Do not use CHG on your head or face. If the solution gets into your ears or eyes, rinse them well with water. Avoid your genital area. Avoid any areas of skin that have broken skin, cuts, or scrapes. Scrub your back and under your arms. Make sure to wash skin folds. Let the lather sit on your skin for 1-2 minutes or as long as told by your health care provider. Using a different clean, wet washcloth, thoroughly rinse your entire body. Make sure that all body creases and crevices are rinsed  well. Dry off with a clean towel. Do not put any substances on your body afterward--such as powder, lotion, or perfume--unless you are told to do so by your health care provider. Only use lotions that are recommended by the manufacturer. Put on clean clothes or pajamas. If it is the night before your surgery, sleep in clean sheets. How to use CHG prepackaged cloths Only use CHG cloths as told by your health care provider, and follow the instructions on the label. Use the CHG cloth on clean, dry skin. Do not use the CHG cloth on your head or face unless your health care provider tells you to. When washing with the CHG cloth: Avoid your genital area. Avoid any areas of skin that have broken skin, cuts, or scrapes. Before surgery Follow these steps when using a CHG cloth to clean before surgery (unless your health care provider gives you different instructions): Using the CHG cloth, vigorously scrub the part of your body where you will be having surgery. Scrub using a back-and-forth motion for 3 minutes. The area on your body should be completely wet with CHG when you are done scrubbing. Do not rinse. Discard the cloth and let the area air-dry. Do not put any substances on the area afterward, such as powder, lotion, or perfume. Put on clean clothes or pajamas. If it is the night before your surgery, sleep in clean sheets.  For general bathing Follow these steps when using CHG cloths for general bathing (unless your health care provider gives you different instructions). Use a separate CHG cloth for each area of your body. Make sure you wash between any folds of skin  and between your fingers and toes. Wash your body in the following order, switching to a new cloth after each step: The front of your neck, shoulders, and chest. Both of your arms, under your arms, and your hands. Your stomach and groin area, avoiding the genitals. Your right leg and foot. Your left leg and foot. The back of your  neck, your back, and your buttocks. Do not rinse. Discard the cloth and let the area air-dry. Do not put any substances on your body afterward--such as powder, lotion, or perfume--unless you are told to do so by your health care provider. Only use lotions that are recommended by the manufacturer. Put on clean clothes or pajamas. Contact a health care provider if: Your skin gets irritated after scrubbing. You have questions about using your solution or cloth. Get help right away if: Your eyes become very red or swollen. Your eyes itch badly. Your skin itches badly and is red or swollen. Your hearing changes. You have trouble seeing. You have swelling or tingling in your mouth or throat. You have trouble breathing. You swallow any chlorhexidine. Summary Chlorhexidine gluconate (CHG) is a germ-killing (antiseptic) solution that is used to clean the skin. Cleaning your skin with CHG may help to lower your risk for infection. You may be given CHG to use for bathing. It may be in a bottle or in a prepackaged cloth to use on your skin. Carefully follow your health care provider's instructions and the instructions on the product label. Do not use CHG if you have a chlorhexidine allergy. Contact your health care provider if your skin gets irritated after scrubbing. This information is not intended to replace advice given to you by your health care provider. Make sure you discuss any questions you have with your healthcare provider. Document Revised: 03/08/2020 Document Reviewed: 04/12/2020 Elsevier Patient Education  [place].

## 2021-04-25 ENCOUNTER — Encounter (HOSPITAL_COMMUNITY): Payer: Self-pay

## 2021-04-25 ENCOUNTER — Encounter (HOSPITAL_COMMUNITY)
Admission: RE | Admit: 2021-04-25 | Discharge: 2021-04-25 | Disposition: A | Discharge status: Home / Self Care | Payer: Medicare HMO | Source: Ambulatory Visit | Attending: General Surgery | Admitting: General Surgery

## 2021-04-25 ENCOUNTER — Other Ambulatory Visit (HOSPITAL_COMMUNITY)
Admission: RE | Admit: 2021-04-25 | Discharge: 2021-04-25 | Disposition: A | Discharge status: Home / Self Care | Payer: Medicare HMO | Source: Ambulatory Visit | Attending: General Surgery | Admitting: General Surgery

## 2021-04-25 ENCOUNTER — Other Ambulatory Visit: Payer: Self-pay

## 2021-04-25 DIAGNOSIS — Z20822 Contact with and (suspected) exposure to covid-19: Secondary | ICD-10-CM | POA: Insufficient documentation

## 2021-04-25 DIAGNOSIS — Z01812 Encounter for preprocedural laboratory examination: Secondary | ICD-10-CM | POA: Insufficient documentation

## 2021-04-25 LAB — CBC WITH DIFFERENTIAL/PLATELET
Abs Immature Granulocytes: 0.04 10*3/uL (ref 0.00–0.07)
Basophils Absolute: 0.1 10*3/uL (ref 0.0–0.1)
Basophils Relative: 1 %
Eosinophils Absolute: 0.5 10*3/uL (ref 0.0–0.5)
Eosinophils Relative: 5 %
HCT: 49 % — ABNORMAL HIGH (ref 36.0–46.0)
Hemoglobin: 15.4 g/dL — ABNORMAL HIGH (ref 12.0–15.0)
Immature Granulocytes: 0 %
Lymphocytes Relative: 27 %
Lymphs Abs: 2.7 10*3/uL (ref 0.7–4.0)
MCH: 27.3 pg (ref 26.0–34.0)
MCHC: 31.4 g/dL (ref 30.0–36.0)
MCV: 86.7 fL (ref 80.0–100.0)
Monocytes Absolute: 0.7 10*3/uL (ref 0.1–1.0)
Monocytes Relative: 7 %
Neutro Abs: 6 10*3/uL (ref 1.7–7.7)
Neutrophils Relative %: 60 %
Platelets: 277 10*3/uL (ref 150–400)
RBC: 5.65 MIL/uL — ABNORMAL HIGH (ref 3.87–5.11)
RDW: 15.6 % — ABNORMAL HIGH (ref 11.5–15.5)
WBC: 10.1 10*3/uL (ref 4.0–10.5)
nRBC: 0 % (ref 0.0–0.2)

## 2021-04-25 LAB — BASIC METABOLIC PANEL
Anion gap: 10 (ref 5–15)
BUN: 10 mg/dL (ref 8–23)
CO2: 25 mmol/L (ref 22–32)
Calcium: 9.3 mg/dL (ref 8.9–10.3)
Chloride: 99 mmol/L (ref 98–111)
Creatinine, Ser: 0.65 mg/dL (ref 0.44–1.00)
GFR, Estimated: >60 mL/min (ref >60)
Glucose, Bld: 84 mg/dL (ref 70–99)
Potassium: 3.8 mmol/L (ref 3.5–5.1)
Sodium: 134 mmol/L — ABNORMAL LOW (ref 135–145)

## 2021-04-26 LAB — SARS CORONAVIRUS 2 (TAT 6-24 HRS): SARS Coronavirus 2: NEGATIVE

## 2021-04-28 ENCOUNTER — Encounter (HOSPITAL_COMMUNITY): Payer: Self-pay | Admitting: General Surgery

## 2021-04-28 ENCOUNTER — Inpatient Hospital Stay (HOSPITAL_COMMUNITY): Payer: Medicare HMO | Admitting: Anesthesiology

## 2021-04-28 ENCOUNTER — Encounter (HOSPITAL_COMMUNITY)
Admission: RE | Disposition: A | Discharge status: Home / Self Care | Payer: Self-pay | Source: Home / Self Care | Attending: General Surgery

## 2021-04-28 ENCOUNTER — Other Ambulatory Visit: Payer: Self-pay

## 2021-04-28 ENCOUNTER — Inpatient Hospital Stay (HOSPITAL_COMMUNITY)
Admission: RE | Admit: 2021-04-28 | Discharge: 2021-05-05 | DRG: 345 | Disposition: A | Discharge status: Home Health | Payer: Medicare HMO | Attending: Family Medicine | Admitting: Family Medicine

## 2021-04-28 DIAGNOSIS — G40909 Epilepsy, unspecified, not intractable, without status epilepticus: Secondary | ICD-10-CM

## 2021-04-28 DIAGNOSIS — D62 Acute posthemorrhagic anemia: Secondary | ICD-10-CM

## 2021-04-28 DIAGNOSIS — R1084 Generalized abdominal pain: Secondary | ICD-10-CM | POA: Diagnosis not present

## 2021-04-28 DIAGNOSIS — Z789 Other specified health status: Secondary | ICD-10-CM | POA: Diagnosis present

## 2021-04-28 DIAGNOSIS — Z79899 Other long term (current) drug therapy: Secondary | ICD-10-CM

## 2021-04-28 DIAGNOSIS — Z8249 Family history of ischemic heart disease and other diseases of the circulatory system: Secondary | ICD-10-CM

## 2021-04-28 DIAGNOSIS — Z886 Allergy status to analgesic agent status: Secondary | ICD-10-CM

## 2021-04-28 DIAGNOSIS — D696 Thrombocytopenia, unspecified: Secondary | ICD-10-CM | POA: Diagnosis not present

## 2021-04-28 DIAGNOSIS — F411 Generalized anxiety disorder: Secondary | ICD-10-CM | POA: Diagnosis present

## 2021-04-28 DIAGNOSIS — F5104 Psychophysiologic insomnia: Secondary | ICD-10-CM | POA: Diagnosis present

## 2021-04-28 DIAGNOSIS — G4700 Insomnia, unspecified: Secondary | ICD-10-CM | POA: Diagnosis present

## 2021-04-28 DIAGNOSIS — K921 Melena: Secondary | ICD-10-CM | POA: Diagnosis not present

## 2021-04-28 DIAGNOSIS — E669 Obesity, unspecified: Secondary | ICD-10-CM | POA: Diagnosis not present

## 2021-04-28 DIAGNOSIS — H409 Unspecified glaucoma: Secondary | ICD-10-CM | POA: Diagnosis present

## 2021-04-28 DIAGNOSIS — I10 Essential (primary) hypertension: Secondary | ICD-10-CM | POA: Diagnosis not present

## 2021-04-28 DIAGNOSIS — M545 Low back pain, unspecified: Secondary | ICD-10-CM

## 2021-04-28 DIAGNOSIS — K8689 Other specified diseases of pancreas: Secondary | ICD-10-CM | POA: Diagnosis not present

## 2021-04-28 DIAGNOSIS — Z20822 Contact with and (suspected) exposure to covid-19: Secondary | ICD-10-CM | POA: Diagnosis present

## 2021-04-28 DIAGNOSIS — D649 Anemia, unspecified: Secondary | ICD-10-CM | POA: Diagnosis not present

## 2021-04-28 DIAGNOSIS — K5900 Constipation, unspecified: Secondary | ICD-10-CM | POA: Diagnosis present

## 2021-04-28 DIAGNOSIS — K435 Parastomal hernia without obstruction or  gangrene: Secondary | ICD-10-CM | POA: Diagnosis present

## 2021-04-28 DIAGNOSIS — K219 Gastro-esophageal reflux disease without esophagitis: Secondary | ICD-10-CM | POA: Diagnosis not present

## 2021-04-28 DIAGNOSIS — D693 Immune thrombocytopenic purpura: Secondary | ICD-10-CM | POA: Diagnosis not present

## 2021-04-28 DIAGNOSIS — I700 Atherosclerosis of aorta: Secondary | ICD-10-CM | POA: Diagnosis not present

## 2021-04-28 DIAGNOSIS — F1320 Sedative, hypnotic or anxiolytic dependence, uncomplicated: Secondary | ICD-10-CM | POA: Diagnosis present

## 2021-04-28 DIAGNOSIS — G8929 Other chronic pain: Secondary | ICD-10-CM | POA: Diagnosis present

## 2021-04-28 DIAGNOSIS — E785 Hyperlipidemia, unspecified: Secondary | ICD-10-CM | POA: Diagnosis present

## 2021-04-28 DIAGNOSIS — M810 Age-related osteoporosis without current pathological fracture: Secondary | ICD-10-CM | POA: Diagnosis present

## 2021-04-28 DIAGNOSIS — F17200 Nicotine dependence, unspecified, uncomplicated: Secondary | ICD-10-CM | POA: Diagnosis present

## 2021-04-28 DIAGNOSIS — R338 Other retention of urine: Secondary | ICD-10-CM | POA: Diagnosis not present

## 2021-04-28 DIAGNOSIS — Z933 Colostomy status: Secondary | ICD-10-CM | POA: Diagnosis not present

## 2021-04-28 DIAGNOSIS — R69 Illness, unspecified: Secondary | ICD-10-CM | POA: Diagnosis not present

## 2021-04-28 DIAGNOSIS — Z716 Tobacco abuse counseling: Secondary | ICD-10-CM

## 2021-04-28 DIAGNOSIS — F32A Depression, unspecified: Secondary | ICD-10-CM | POA: Diagnosis present

## 2021-04-28 DIAGNOSIS — N9489 Other specified conditions associated with female genital organs and menstrual cycle: Secondary | ICD-10-CM | POA: Diagnosis present

## 2021-04-28 DIAGNOSIS — E782 Mixed hyperlipidemia: Secondary | ICD-10-CM | POA: Diagnosis not present

## 2021-04-28 DIAGNOSIS — Z79891 Long term (current) use of opiate analgesic: Secondary | ICD-10-CM

## 2021-04-28 DIAGNOSIS — R296 Repeated falls: Secondary | ICD-10-CM | POA: Diagnosis present

## 2021-04-28 DIAGNOSIS — Z433 Encounter for attention to colostomy: Principal | ICD-10-CM

## 2021-04-28 DIAGNOSIS — Z6832 Body mass index (BMI) 32.0-32.9, adult: Secondary | ICD-10-CM

## 2021-04-28 DIAGNOSIS — F17210 Nicotine dependence, cigarettes, uncomplicated: Secondary | ICD-10-CM | POA: Diagnosis present

## 2021-04-28 DIAGNOSIS — D72829 Elevated white blood cell count, unspecified: Secondary | ICD-10-CM | POA: Diagnosis present

## 2021-04-28 DIAGNOSIS — E876 Hypokalemia: Secondary | ICD-10-CM | POA: Diagnosis present

## 2021-04-28 DIAGNOSIS — Z90710 Acquired absence of both cervix and uterus: Secondary | ICD-10-CM

## 2021-04-28 DIAGNOSIS — Z981 Arthrodesis status: Secondary | ICD-10-CM

## 2021-04-28 DIAGNOSIS — Z9049 Acquired absence of other specified parts of digestive tract: Secondary | ICD-10-CM

## 2021-04-28 DIAGNOSIS — M8580 Other specified disorders of bone density and structure, unspecified site: Secondary | ICD-10-CM | POA: Diagnosis present

## 2021-04-28 DIAGNOSIS — G8918 Other acute postprocedural pain: Secondary | ICD-10-CM

## 2021-04-28 DIAGNOSIS — M5030 Other cervical disc degeneration, unspecified cervical region: Secondary | ICD-10-CM

## 2021-04-28 DIAGNOSIS — K5909 Other constipation: Secondary | ICD-10-CM | POA: Diagnosis present

## 2021-04-28 DIAGNOSIS — R188 Other ascites: Secondary | ICD-10-CM | POA: Diagnosis not present

## 2021-04-28 HISTORY — DX: Perforation of intestine (nontraumatic): K63.1

## 2021-04-28 HISTORY — PX: COLOSTOMY REVERSAL: SHX5782

## 2021-04-28 SURGERY — COLOSTOMY REVERSAL
Anesthesia: General

## 2021-04-28 MED ORDER — OXYCODONE HCL 5 MG PO TABS
5.0000 mg | ORAL_TABLET | ORAL | Status: DC | PRN
  Administered 2021-04-28 – 2021-05-01 (×11): 10 mg via ORAL
  Filled 2021-04-28 (×11): qty 2

## 2021-04-28 MED ORDER — PROMETHAZINE HCL 25 MG/ML IJ SOLN
6.2500 mg | Freq: Four times a day (QID) | INTRAMUSCULAR | Status: AC | PRN
  Administered 2021-04-28: 6.25 mg via INTRAVENOUS

## 2021-04-28 MED ORDER — SODIUM CHLORIDE 0.9 % IV SOLN
INTRAVENOUS | Status: AC
  Administered 2021-04-29: 1 g via INTRAVENOUS
  Filled 2021-04-28 (×2): qty 1

## 2021-04-28 MED ORDER — HYDROMORPHONE HCL 1 MG/ML IJ SOLN
0.5000 mg | INTRAMUSCULAR | Status: AC | PRN
Start: 2021-04-28 — End: 2021-04-28
  Administered 2021-04-28 (×2): 0.5 mg via INTRAVENOUS

## 2021-04-28 MED ORDER — LIDOCAINE HCL (PF) 2 % IJ SOLN
INTRAMUSCULAR | Status: AC
  Filled 2021-04-28: qty 5

## 2021-04-28 MED ORDER — ONDANSETRON HCL 4 MG/2ML IJ SOLN
4.0000 mg | Freq: Once | INTRAMUSCULAR | Status: DC | PRN
Start: 2021-04-28 — End: 2021-04-28

## 2021-04-28 MED ORDER — SODIUM CHLORIDE 0.9 % IR SOLN
Status: DC | PRN
  Administered 2021-04-28: 1000 mL
  Administered 2021-04-28: 2000 mL

## 2021-04-28 MED ORDER — FENTANYL CITRATE (PF) 250 MCG/5ML IJ SOLN
INTRAMUSCULAR | Status: AC
  Filled 2021-04-28: qty 5

## 2021-04-28 MED ORDER — ORAL CARE MOUTH RINSE: 15.0000 mL | Freq: Once | OROMUCOSAL | Status: AC

## 2021-04-28 MED ORDER — SODIUM CHLORIDE 0.9 % IV SOLN
2.0000 g | Freq: Two times a day (BID) | INTRAVENOUS | Status: DC
  Administered 2021-04-28: 2 g via INTRAVENOUS
  Filled 2021-04-28 (×2): qty 2

## 2021-04-28 MED ORDER — ONDANSETRON HCL 4 MG/2ML IJ SOLN
INTRAMUSCULAR | Status: AC
  Filled 2021-04-28: qty 2

## 2021-04-28 MED ORDER — FENTANYL CITRATE (PF) 100 MCG/2ML IJ SOLN
INTRAMUSCULAR | Status: DC | PRN
  Administered 2021-04-28 (×2): 100 ug via INTRAVENOUS
  Administered 2021-04-28 (×2): 50 ug via INTRAVENOUS
  Administered 2021-04-28: 100 ug via INTRAVENOUS
  Administered 2021-04-28: 50 ug via INTRAVENOUS
  Administered 2021-04-28: 100 ug via INTRAVENOUS
  Administered 2021-04-28: 150 ug via INTRAVENOUS

## 2021-04-28 MED ORDER — LORATADINE 10 MG PO TABS
10.0000 mg | ORAL_TABLET | Freq: Every day | ORAL | Status: DC
  Administered 2021-04-28 – 2021-05-05 (×8): 10 mg via ORAL
  Filled 2021-04-28 (×9): qty 1

## 2021-04-28 MED ORDER — PROMETHAZINE HCL 25 MG/ML IJ SOLN
INTRAMUSCULAR | Status: AC
  Filled 2021-04-28: qty 1

## 2021-04-28 MED ORDER — GABAPENTIN 300 MG PO CAPS
300.0000 mg | ORAL_CAPSULE | Freq: Two times a day (BID) | ORAL | Status: DC
  Administered 2021-04-28 – 2021-05-05 (×14): 300 mg via ORAL
  Filled 2021-04-28 (×14): qty 1

## 2021-04-28 MED ORDER — BUPIVACAINE LIPOSOME 1.3 % IJ SUSP
INTRAMUSCULAR | Status: DC | PRN
  Administered 2021-04-28: 20 mL

## 2021-04-28 MED ORDER — ENOXAPARIN SODIUM 40 MG/0.4ML IJ SOSY
PREFILLED_SYRINGE | INTRAMUSCULAR | Status: AC
  Filled 2021-04-28: qty 0.4

## 2021-04-28 MED ORDER — ENOXAPARIN SODIUM 40 MG/0.4ML IJ SOSY
40.0000 mg | PREFILLED_SYRINGE | Freq: Once | INTRAMUSCULAR | Status: AC
  Administered 2021-04-28: 07:00:00 40 mg via SUBCUTANEOUS

## 2021-04-28 MED ORDER — MIDAZOLAM HCL 2 MG/2ML IJ SOLN
INTRAMUSCULAR | Status: AC
  Filled 2021-04-28: qty 2

## 2021-04-28 MED ORDER — LIDOCAINE HCL (CARDIAC) PF 100 MG/5ML IV SOSY
PREFILLED_SYRINGE | INTRAVENOUS | Status: DC | PRN
  Administered 2021-04-28: 100 mg via INTRAVENOUS

## 2021-04-28 MED ORDER — PROPOFOL 10 MG/ML IV BOLUS
INTRAVENOUS | Status: DC | PRN
  Administered 2021-04-28: 200 mg via INTRAVENOUS

## 2021-04-28 MED ORDER — ENSURE PRE-SURGERY PO LIQD
296.0000 mL | Freq: Once | ORAL | Status: DC
  Filled 2021-04-28: qty 296

## 2021-04-28 MED ORDER — LACTATED RINGERS IV SOLN: INTRAVENOUS | Status: DC

## 2021-04-28 MED ORDER — CHLORHEXIDINE GLUCONATE 0.12 % MT SOLN
15.0000 mL | Freq: Once | OROMUCOSAL | Status: AC
  Administered 2021-04-28: 15 mL via OROMUCOSAL

## 2021-04-28 MED ORDER — ACETAMINOPHEN 500 MG PO TABS
1000.0000 mg | ORAL_TABLET | Freq: Four times a day (QID) | ORAL | Status: DC
  Administered 2021-04-28 – 2021-05-05 (×24): 1000 mg via ORAL
  Filled 2021-04-28 (×25): qty 2

## 2021-04-28 MED ORDER — TIZANIDINE HCL 4 MG PO TABS
4.0000 mg | ORAL_TABLET | Freq: Three times a day (TID) | ORAL | Status: DC | PRN
  Administered 2021-04-30: 4 mg via ORAL
  Filled 2021-04-28: qty 1

## 2021-04-28 MED ORDER — BUSPIRONE HCL 5 MG PO TABS
15.0000 mg | ORAL_TABLET | Freq: Three times a day (TID) | ORAL | Status: DC
  Administered 2021-04-28 – 2021-05-05 (×20): 15 mg via ORAL
  Filled 2021-04-28 (×20): qty 3

## 2021-04-28 MED ORDER — ROCURONIUM BROMIDE 10 MG/ML (PF) SYRINGE
PREFILLED_SYRINGE | INTRAVENOUS | Status: AC
  Filled 2021-04-28: qty 10

## 2021-04-28 MED ORDER — PROPOFOL 10 MG/ML IV BOLUS
INTRAVENOUS | Status: AC
  Filled 2021-04-28: qty 20

## 2021-04-28 MED ORDER — ENOXAPARIN SODIUM 40 MG/0.4ML IJ SOSY
40.0000 mg | PREFILLED_SYRINGE | INTRAMUSCULAR | Status: DC
  Filled 2021-04-28: qty 0.4

## 2021-04-28 MED ORDER — ONDANSETRON HCL 4 MG/2ML IJ SOLN
INTRAMUSCULAR | Status: DC | PRN
  Administered 2021-04-28: 4 mg via INTRAVENOUS

## 2021-04-28 MED ORDER — FENTANYL CITRATE (PF) 100 MCG/2ML IJ SOLN
INTRAMUSCULAR | Status: AC
  Filled 2021-04-28: qty 2

## 2021-04-28 MED ORDER — ALUM & MAG HYDROXIDE-SIMETH 200-200-20 MG/5ML PO SUSP
30.0000 mL | Freq: Four times a day (QID) | ORAL | Status: DC | PRN
  Administered 2021-04-28 – 2021-05-03 (×4): 30 mL via ORAL
  Filled 2021-04-28 (×4): qty 30

## 2021-04-28 MED ORDER — FAMOTIDINE 20 MG PO TABS
20.0000 mg | ORAL_TABLET | Freq: Every day | ORAL | Status: DC
  Administered 2021-04-28 – 2021-04-30 (×3): 20 mg via ORAL
  Filled 2021-04-28 (×3): qty 1

## 2021-04-28 MED ORDER — SACCHAROMYCES BOULARDII 250 MG PO CAPS
250.0000 mg | ORAL_CAPSULE | Freq: Two times a day (BID) | ORAL | Status: DC
  Administered 2021-04-28 – 2021-05-05 (×13): 250 mg via ORAL
  Filled 2021-04-28 (×14): qty 1

## 2021-04-28 MED ORDER — PROMETHAZINE HCL 25 MG/ML IJ SOLN
6.2500 mg | Freq: Once | INTRAMUSCULAR | Status: AC
  Administered 2021-04-28: 6.25 mg via INTRAVENOUS

## 2021-04-28 MED ORDER — MEPERIDINE HCL 50 MG/ML IJ SOLN
6.2500 mg | INTRAMUSCULAR | Status: DC | PRN
  Administered 2021-04-28: 12.5 mg via INTRAVENOUS
  Filled 2021-04-28: qty 1

## 2021-04-28 MED ORDER — CHLORHEXIDINE GLUCONATE CLOTH 2 % EX PADS: 6.0000 | MEDICATED_PAD | Freq: Once | CUTANEOUS | Status: DC

## 2021-04-28 MED ORDER — MIDAZOLAM HCL 5 MG/5ML IJ SOLN
INTRAMUSCULAR | Status: DC | PRN
  Administered 2021-04-28: 2 mg via INTRAVENOUS

## 2021-04-28 MED ORDER — LACTATED RINGERS IV SOLN
INTRAVENOUS | Status: DC
  Administered 2021-04-28 (×2): 1000 mL via INTRAVENOUS

## 2021-04-28 MED ORDER — TRAZODONE HCL 50 MG PO TABS
150.0000 mg | ORAL_TABLET | Freq: Every day | ORAL | Status: DC
  Administered 2021-04-28 – 2021-05-04 (×7): 150 mg via ORAL
  Filled 2021-04-28: qty 3
  Filled 2021-04-28: qty 1
  Filled 2021-04-28: qty 3
  Filled 2021-04-28: qty 1
  Filled 2021-04-28: qty 3
  Filled 2021-04-28 (×2): qty 1

## 2021-04-28 MED ORDER — ALVIMOPAN 12 MG PO CAPS
12.0000 mg | ORAL_CAPSULE | ORAL | Status: AC
  Administered 2021-04-28: 12 mg via ORAL

## 2021-04-28 MED ORDER — ALPRAZOLAM 1 MG PO TABS
1.0000 mg | ORAL_TABLET | Freq: Three times a day (TID) | ORAL | Status: DC | PRN
  Administered 2021-04-28 – 2021-05-04 (×16): 1 mg via ORAL
  Filled 2021-04-28 (×10): qty 1
  Filled 2021-04-28: qty 2
  Filled 2021-04-28 (×3): qty 1
  Filled 2021-04-28 (×2): qty 2
  Filled 2021-04-28: qty 1

## 2021-04-28 MED ORDER — HYDROMORPHONE HCL 1 MG/ML IJ SOLN
0.2500 mg | INTRAMUSCULAR | Status: DC | PRN
  Administered 2021-04-28 (×4): 0.5 mg via INTRAVENOUS
  Filled 2021-04-28 (×6): qty 0.5

## 2021-04-28 MED ORDER — NITROGLYCERIN 0.4 MG SL SUBL: 0.4000 mg | SUBLINGUAL_TABLET | SUBLINGUAL | Status: DC | PRN

## 2021-04-28 MED ORDER — MORPHINE SULFATE (PF) 2 MG/ML IV SOLN
2.0000 mg | INTRAVENOUS | Status: DC | PRN
  Administered 2021-04-28 – 2021-05-02 (×14): 2 mg via INTRAVENOUS
  Filled 2021-04-28 (×14): qty 1

## 2021-04-28 MED ORDER — PHENYLEPHRINE HCL (PRESSORS) 10 MG/ML IV SOLN
INTRAVENOUS | Status: DC | PRN
  Administered 2021-04-28: 120 ug via INTRAVENOUS
  Administered 2021-04-28: 160 ug via INTRAVENOUS
  Administered 2021-04-28: 120 ug via INTRAVENOUS

## 2021-04-28 MED ORDER — PHENYLEPHRINE 40 MCG/ML (10ML) SYRINGE FOR IV PUSH (FOR BLOOD PRESSURE SUPPORT)
PREFILLED_SYRINGE | INTRAVENOUS | Status: AC
  Filled 2021-04-28: qty 30

## 2021-04-28 MED ORDER — DEXAMETHASONE SODIUM PHOSPHATE 10 MG/ML IJ SOLN
INTRAMUSCULAR | Status: AC
  Filled 2021-04-28: qty 1

## 2021-04-28 MED ORDER — SODIUM CHLORIDE 0.9 % IV SOLN
2.0000 g | INTRAVENOUS | Status: AC
  Administered 2021-04-28: 2 g via INTRAVENOUS

## 2021-04-28 MED ORDER — ONDANSETRON HCL 4 MG/2ML IJ SOLN
4.0000 mg | Freq: Four times a day (QID) | INTRAMUSCULAR | Status: DC | PRN
  Administered 2021-04-28 – 2021-05-04 (×4): 4 mg via INTRAVENOUS
  Filled 2021-04-28 (×4): qty 2

## 2021-04-28 MED ORDER — CHLORHEXIDINE GLUCONATE 0.12 % MT SOLN
OROMUCOSAL | Status: AC
  Filled 2021-04-28: qty 15

## 2021-04-28 MED ORDER — MIDAZOLAM HCL 2 MG/2ML IJ SOLN
2.0000 mg | Freq: Once | INTRAMUSCULAR | Status: AC
  Administered 2021-04-28: 2 mg via INTRAVENOUS

## 2021-04-28 MED ORDER — ROCURONIUM BROMIDE 100 MG/10ML IV SOLN
INTRAVENOUS | Status: DC | PRN
  Administered 2021-04-28: 20 mg via INTRAVENOUS
  Administered 2021-04-28: 50 mg via INTRAVENOUS
  Administered 2021-04-28: 10 mg via INTRAVENOUS

## 2021-04-28 MED ORDER — ACETAMINOPHEN 500 MG PO TABS
1000.0000 mg | ORAL_TABLET | ORAL | Status: AC
Start: 2021-04-28 — End: 2021-04-28

## 2021-04-28 MED ORDER — SIMETHICONE 80 MG PO CHEW
40.0000 mg | CHEWABLE_TABLET | Freq: Four times a day (QID) | ORAL | Status: DC | PRN
  Administered 2021-04-28 – 2021-04-29 (×2): 40 mg via ORAL
  Filled 2021-04-28 (×2): qty 1

## 2021-04-28 MED ORDER — ACETAMINOPHEN 500 MG PO TABS
ORAL_TABLET | ORAL | Status: AC
  Administered 2021-04-28: 1000 mg via ORAL
  Filled 2021-04-28: qty 2

## 2021-04-28 MED ORDER — DIPHENHYDRAMINE HCL 50 MG/ML IJ SOLN: 12.5000 mg | Freq: Four times a day (QID) | INTRAMUSCULAR | Status: DC | PRN

## 2021-04-28 MED ORDER — DEXMEDETOMIDINE (PRECEDEX) IN NS 20 MCG/5ML (4 MCG/ML) IV SYRINGE
PREFILLED_SYRINGE | INTRAVENOUS | Status: DC | PRN
  Administered 2021-04-28: 20 ug via INTRAVENOUS

## 2021-04-28 MED ORDER — DIPHENHYDRAMINE HCL 12.5 MG/5ML PO ELIX: 12.5000 mg | ORAL_SOLUTION | Freq: Four times a day (QID) | ORAL | Status: DC | PRN

## 2021-04-28 MED ORDER — SODIUM CHLORIDE FLUSH 0.9 % IV SOLN
INTRAVENOUS | Status: AC
  Filled 2021-04-28: qty 10

## 2021-04-28 MED ORDER — ONDANSETRON HCL 4 MG PO TABS
4.0000 mg | ORAL_TABLET | Freq: Four times a day (QID) | ORAL | Status: DC | PRN
  Administered 2021-04-29: 4 mg via ORAL
  Filled 2021-04-28: qty 1

## 2021-04-28 MED ORDER — METOPROLOL TARTRATE 5 MG/5ML IV SOLN: 5.0000 mg | Freq: Four times a day (QID) | INTRAVENOUS | Status: DC | PRN

## 2021-04-28 MED ORDER — DEXAMETHASONE SODIUM PHOSPHATE 10 MG/ML IJ SOLN
INTRAMUSCULAR | Status: DC | PRN
  Administered 2021-04-28: 10 mg via INTRAVENOUS

## 2021-04-28 MED ORDER — AMITRIPTYLINE HCL 25 MG PO TABS
50.0000 mg | ORAL_TABLET | Freq: Every day | ORAL | Status: DC
  Administered 2021-04-28 – 2021-05-01 (×4): 50 mg via ORAL
  Filled 2021-04-28 (×4): qty 2

## 2021-04-28 MED ORDER — ENSURE SURGERY PO LIQD
237.0000 mL | Freq: Two times a day (BID) | ORAL | Status: DC
  Administered 2021-05-01 – 2021-05-05 (×8): 237 mL via ORAL
  Filled 2021-04-28 (×14): qty 237

## 2021-04-28 MED ORDER — ATENOLOL 50 MG PO TABS
50.0000 mg | ORAL_TABLET | Freq: Every day | ORAL | Status: DC
  Administered 2021-04-29 – 2021-05-05 (×7): 50 mg via ORAL
  Filled 2021-04-28 (×2): qty 1
  Filled 2021-04-28: qty 2
  Filled 2021-04-28 (×2): qty 1
  Filled 2021-04-28 (×2): qty 2

## 2021-04-28 MED ORDER — ALVIMOPAN 12 MG PO CAPS
ORAL_CAPSULE | ORAL | Status: AC
  Administered 2021-04-29: 12 mg via ORAL
  Filled 2021-04-28: qty 1

## 2021-04-28 MED ORDER — CITALOPRAM HYDROBROMIDE 20 MG PO TABS
40.0000 mg | ORAL_TABLET | Freq: Every day | ORAL | Status: DC
  Administered 2021-04-29 – 2021-05-05 (×7): 40 mg via ORAL
  Filled 2021-04-28 (×7): qty 2

## 2021-04-28 MED ORDER — ENSURE PRE-SURGERY PO LIQD
592.0000 mL | Freq: Once | ORAL | Status: DC
  Filled 2021-04-28: qty 592

## 2021-04-28 MED ORDER — SUGAMMADEX SODIUM 200 MG/2ML IV SOLN
INTRAVENOUS | Status: DC | PRN
  Administered 2021-04-28: 160 mg via INTRAVENOUS

## 2021-04-28 MED ORDER — FLEET ENEMA 7-19 GM/118ML RE ENEM
1.0000 | ENEMA | Freq: Once | RECTAL | Status: DC
  Filled 2021-04-28: qty 1

## 2021-04-28 MED ORDER — BUPIVACAINE LIPOSOME 1.3 % IJ SUSP
INTRAMUSCULAR | Status: AC
  Filled 2021-04-28: qty 20

## 2021-04-28 MED ORDER — DEXMEDETOMIDINE (PRECEDEX) IN NS 20 MCG/5ML (4 MCG/ML) IV SYRINGE
PREFILLED_SYRINGE | INTRAVENOUS | Status: AC
  Filled 2021-04-28: qty 5

## 2021-04-28 MED ORDER — LATANOPROST 0.005 % OP SOLN
1.0000 [drp] | Freq: Every day | OPHTHALMIC | Status: DC
  Administered 2021-04-28 – 2021-05-04 (×7): 1 [drp] via OPHTHALMIC
  Filled 2021-04-28 (×2): qty 2.5

## 2021-04-28 MED ORDER — CHLORHEXIDINE GLUCONATE CLOTH 2 % EX PADS
6.0000 | MEDICATED_PAD | Freq: Every day | CUTANEOUS | Status: DC
  Administered 2021-04-28 – 2021-05-05 (×7): 6 via TOPICAL

## 2021-04-28 MED ORDER — ALVIMOPAN 12 MG PO CAPS
12.0000 mg | ORAL_CAPSULE | Freq: Two times a day (BID) | ORAL | Status: DC
  Administered 2021-04-29 – 2021-04-30 (×2): 12 mg via ORAL
  Filled 2021-04-28 (×3): qty 1

## 2021-04-28 SURGICAL SUPPLY — 46 items
CLOTH BEACON ORANGE TIMEOUT ST (SAFETY) ×3 IMPLANT
COVER LIGHT HANDLE STERIS (MISCELLANEOUS) ×6 IMPLANT
COVER WAND RF STERILE (DRAPES) ×3 IMPLANT
DRSG OPSITE POSTOP 4X10 (GAUZE/BANDAGES/DRESSINGS) ×3 IMPLANT
DRSG TEGADERM 4X4.75 (GAUZE/BANDAGES/DRESSINGS) ×3 IMPLANT
ELECT BLADE 6 FLAT ULTRCLN (ELECTRODE) ×3 IMPLANT
ELECT REM PT RETURN 9FT ADLT (ELECTROSURGICAL) ×3
ELECTRODE REM PT RTRN 9FT ADLT (ELECTROSURGICAL) ×1 IMPLANT
GAUZE 4X4 16PLY RFD (DISPOSABLE) ×3 IMPLANT
GAUZE SPONGE 4X4 12PLY STRL (GAUZE/BANDAGES/DRESSINGS) ×3 IMPLANT
GLOVE BIO SURGEON STRL SZ8 (GLOVE) ×3 IMPLANT
GLOVE SURG ENC MOIS LTX SZ6.5 (GLOVE) ×6 IMPLANT
GLOVE SURG SS PI 7.5 STRL IVOR (GLOVE) ×6 IMPLANT
GLOVE SURG UNDER POLY LF SZ6.5 (GLOVE) ×6 IMPLANT
GLOVE SURG UNDER POLY LF SZ7 (GLOVE) ×9 IMPLANT
GOWN STRL REUS W/TWL LRG LVL3 (GOWN DISPOSABLE) ×18 IMPLANT
HANDLE SUCTION POOLE (INSTRUMENTS) ×1 IMPLANT
INST SET MAJOR GENERAL (KITS) ×3 IMPLANT
KIT TURNOVER KIT A (KITS) ×3 IMPLANT
LIGASURE IMPACT 36 18CM CVD LR (INSTRUMENTS) ×3 IMPLANT
MANIFOLD NEPTUNE II (INSTRUMENTS) ×3 IMPLANT
NEEDLE HYPO 18GX1.5 BLUNT FILL (NEEDLE) ×3 IMPLANT
NS IRRIG 1000ML POUR BTL (IV SOLUTION) ×9 IMPLANT
PACK COLON (CUSTOM PROCEDURE TRAY) ×3 IMPLANT
PAD ARMBOARD 7.5X6 YLW CONV (MISCELLANEOUS) ×3 IMPLANT
PENCIL SMOKE EVACUATOR COATED (MISCELLANEOUS) ×3 IMPLANT
RELOAD PROXIMATE 75MM BLUE (ENDOMECHANICALS) ×3 IMPLANT
RETRACTOR WND ALEXIS-O 25 LRG (MISCELLANEOUS) ×1 IMPLANT
RTRCTR WOUND ALEXIS O 25CM LRG (MISCELLANEOUS) ×3
SHEET LAVH (DRAPES) ×3 IMPLANT
SPONGE LAP 18X18 RF (DISPOSABLE) ×6 IMPLANT
STAPLER GUN LINEAR PROX 60 (STAPLE) ×3 IMPLANT
STAPLER PROXIMATE 75MM BLUE (STAPLE) ×3 IMPLANT
STAPLER VISISTAT (STAPLE) ×6 IMPLANT
SUCTION POOLE HANDLE (INSTRUMENTS) ×3
SUT PDS AB CT VIOLET #0 27IN (SUTURE) ×6 IMPLANT
SUT PROLENE 2 0 SH 30 (SUTURE) ×3 IMPLANT
SUT SILK 0 FSL (SUTURE) ×3 IMPLANT
SUT SILK 3 0 SH CR/8 (SUTURE) ×3 IMPLANT
SUT VIC AB 0 CT1 27 (SUTURE) ×6
SUT VIC AB 0 CT1 27XCR 8 STRN (SUTURE) ×2 IMPLANT
SYR BULB IRRIG 60ML STRL (SYRINGE) ×3 IMPLANT
TOWEL SURG RFD BLUE STRL DISP (DISPOSABLE) ×3 IMPLANT
TRAY FOLEY W/BAG SLVR 16FR (SET/KITS/TRAYS/PACK) ×3
TRAY FOLEY W/BAG SLVR 16FR ST (SET/KITS/TRAYS/PACK) ×1 IMPLANT
YANKAUER SUCT BULB TIP 10FT TU (MISCELLANEOUS) ×3 IMPLANT

## 2021-04-28 NOTE — Anesthesia Preprocedure Evaluation (Signed)
Anesthesia Evaluation  Patient identified by MRN, date of birth, ID band Patient awake    Reviewed: Allergy & Precautions, NPO status , Patient's Chart, lab work & pertinent test results, reviewed documented beta blocker date and time   History of Anesthesia Complications Negative for: history of anesthetic complications  Airway Mallampati: II  TM Distance: >3 FB Neck ROM: Limited   Comment: Neck sx3, fusion Dental  (+) Dental Advisory Given, Chipped,  Broken teethx3:   Pulmonary shortness of breath and with exertion, pneumonia, Current Smoker,    Pulmonary exam normal breath sounds clear to auscultation       Cardiovascular Exercise Tolerance: Good hypertension, Pt. on medications and Pt. on home beta blockers Normal cardiovascular exam Rhythm:Regular Rate:Normal  Left ventricle: The cavity size was normal. Wall thickness was increased in a pattern of mild LVH. Systolic function was normal.  The estimated ejection fraction was in the range of 60% to 65%.  Wall motion was normal; there were no regional wall motionabnormalities. Features are consistent with a pseudonormal left ventricular filling pattern, with concomitant abnormal relaxation and increased filling pressure (grade 2 diastolic dysfunction).  Indeterminate filling pressures.  - Aortic valve: There was mild regurgitation.  - Mitral valve: There was mild regurgitation.  - Systemic veins: Dilated IVC with normal respiratory variation.  Estimated CVP 8 mmHg.   21-Dec-2020 16:45:51 [facility] ROUTINE RECORD Sinus tachycardia Abnormal R-wave progression, early transition Nonspecific repol abnormality, diffuse leads Prolonged QT interval Baseline wander in lead(s) II    Neuro/Psych  Headaches, Seizures -, Well Controlled,  PSYCHIATRIC DISORDERS Anxiety Depression  Neuromuscular disease    GI/Hepatic PUD, GERD  Medicated,  Endo/Other     Renal/GU Renal disease     Musculoskeletal  (+) Arthritis , Osteoarthritis,  Neck sx3, fusion, back sx   Abdominal   Peds  Hematology  (+) Blood dyscrasia (ITP), ,   Anesthesia Other Findings   Reproductive/Obstetrics                            Anesthesia Physical Anesthesia Plan  ASA: 3  Anesthesia Plan: General   Post-op Pain Management:    Induction: Intravenous  PONV Risk Score and Plan: 4 or greater and Ondansetron, Dexamethasone and Midazolam  Airway Management Planned: Oral ETT  Additional Equipment:   Intra-op Plan:   Post-operative Plan: Extubation in OR  Informed Consent: I have reviewed the patients History and Physical, chart, labs and discussed the procedure including the risks, benefits and alternatives for the proposed anesthesia with the patient or authorized representative who has indicated his/her understanding and acceptance.     Dental advisory given  Plan Discussed with: CRNA and Surgeon  Anesthesia Plan Comments:         Anesthesia Quick Evaluation

## 2021-04-28 NOTE — Anesthesia Procedure Notes (Signed)
Procedure Name: Intubation Date/Time: 04/28/2021 7:41 AM Performed by: Riki Sheer, CRNA Pre-anesthesia Checklist: Patient identified, Emergency Drugs available, Suction available, Patient being monitored and Timeout performed Patient Re-evaluated:Patient Re-evaluated prior to induction Oxygen Delivery Method: Circle system utilized Preoxygenation: Pre-oxygenation with 100% oxygen Induction Type: IV induction Ventilation: Mask ventilation without difficulty Laryngoscope Size: Miller and 2 Grade View: Grade I Tube type: Oral Tube size: 7.0 mm Number of attempts: 1 Airway Equipment and Method: Stylet Placement Confirmation: ETT inserted through vocal cords under direct vision, positive ETCO2, CO2 detector and breath sounds checked- equal and bilateral Secured at: 24 cm Tube secured with: Tape Dental Injury: Teeth and Oropharynx as per pre-operative assessment

## 2021-04-28 NOTE — Interval H&P Note (Signed)
History and Physical Interval Note:  04/28/2021 7:17 AM  [name]  has presented today for surgery, with the diagnosis of Colostomy in place.  The various methods of treatment have been discussed with the patient and family. After consideration of risks, benefits and other options for treatment, the patient has consented to  Procedure(s): COLOSTOMY REVERSAL (N/A) as a surgical intervention.  The patient's history has been reviewed, patient examined, no change in status, stable for surgery.  I have reviewed the patient's chart and labs.  Questions were answered to the patient's satisfaction.    Bowel prep done.  Virl Cagey

## 2021-04-28 NOTE — Transfer of Care (Signed)
Immediate Anesthesia Transfer of Care Note  Patient: [name]  Procedure(s) Performed: COLOSTOMY REVERSAL  Patient Location: PACU  Anesthesia Type:General  Level of Consciousness: awake, alert  and oriented  Airway & Oxygen Therapy: Patient Spontanous Breathing and Patient connected to nasal cannula oxygen  Post-op Assessment: Report given to RN and Post -op Vital signs reviewed and stable  Post vital signs: Reviewed and stable  Last Vitals:  Vitals Value Taken Time  BP 136/75 04/28/21 1053  Temp    Pulse 98 04/28/21 1054  Resp 11 04/28/21 1054  SpO2 98 % 04/28/21 1054  Vitals shown include unvalidated device data.  Last Pain:  Vitals:   04/28/21 0637  TempSrc: Oral  PainSc: 0-No pain      Patients Stated Pain Goal: 8 (09/08/58 4585)  Complications: No notable events documented.

## 2021-04-28 NOTE — Anesthesia Postprocedure Evaluation (Signed)
Anesthesia Post Note  Patient: [name]  Procedure(s) Performed: COLOSTOMY REVERSAL  Patient location during evaluation: PACU Anesthesia Type: General Level of consciousness: awake and alert and oriented Pain management: pain level controlled Vital Signs Assessment: post-procedure vital signs reviewed and stable Respiratory status: spontaneous breathing and respiratory function stable Cardiovascular status: blood pressure returned to baseline and stable Postop Assessment: no apparent nausea or vomiting Anesthetic complications: no   No notable events documented.   Last Vitals:  Vitals:   04/28/21 1500 04/28/21 1515  BP: (!) 146/88 129/81  Pulse:    Resp: 17 (!) 21  Temp:    SpO2: 99% 98%    Last Pain:  Vitals:   04/28/21 1345  TempSrc:   PainSc: 3                  Alverna Fawley C Christine Schiefelbein

## 2021-04-28 NOTE — Op Note (Signed)
[facility] Operative Note  04/28/21  Preoperative Diagnosis: Colostomy in place   Postoperative Diagnosis: Same   Procedure(s) Performed: Colostomy reversal    Surgeon: Lanell Matar. Constance Haw, MD   Assistants: Aviva Signs, MD     Anesthesia: General endotracheal   Anesthesiologist: Denese Killings, MD    Specimens: None   Estimated Blood Loss: Minimal   Blood Replacement: None    Complications: None   Wound Class: Clean contaminated    Operative Indications: [name] is a 60 yo who had a stercoral ulcer and had a end colostomy placed. She has been healing well and had a repeat colonoscopy without other disease. We discussed reversal and risk of bleeding, infection, anastomotic leak, injury to other organs, injury to the ureter.   Findings: End colostomy with parastomal hernia, small bowel adhesions    Procedure: The patient was taken to the operating room and placed supine. General endotracheal anesthesia was induced. Intravenous antibiotics were administered per protocol.  An orogastric tube positioned to decompress the stomach. A foley catheter was placed. She was then placed in lithotomy with pressure points padded.  The colostomy was closed with a running 0 silk suture to prevent contamination. The abdomen and peritoneum were prepared and draped in the usual sterile fashion.   The midline scar from her secondary intention was excised and the fascia was entered with care using Metzenbaum scissors.  There were small bowel adhesions to the anterior abdominal wall and down into the pelvis that were taken with scissors. Minor adhesions of small bowel to the end colostomy were taken down with scissors. A wound protector was placed. The small bowel was packed into the left upper quadrant with laparotomy pads.  The rectosigmoid stump was identified and mobilized out of the pelvis. The retroperitoneal structures were protected. The stump came over the pelvic  inlet and would allow for a side to side anastomosis.   From here the end colostomy was taken down at the skin with cut cautery, and the colostomy was freed from the subcutaneous tissue with cautery ensuring no injury to the colon. Once it was mobilized, a 75 mm linear cutting stapler was use to close the end.  The end colostomy was delivered into the abdomen.  The white line of Toldt was mobilized up the descending colon and the colon easily laid into the pelvis along side the stump allowing for a side to side anastomosis.    Towels were placed. Allis clamps were used to align the colon and two colotomies were created. A 75 mm linear cutting stapler created the common channel, and the common colotomy was closed with a TA 60. The staple line was oversewn with 3-0 Silk Lembert suture, and 2 3-0 Silk Crotch sutures were placed. The anastomosis laid into the pelvis and was not under any tension and without twisting. Again the retroperitoneal structures were left in place including the ureter without injury.  The small bowel was ran and a serosal tear was oversewn with a 3-0 Silk Lembert suture.  The small bowel was ran again.  The bowel was placed back into the abdomen without twisting.  Irrigation was performed. Hemostasis was achieved. The omentum was placed over the bowel.   The colostomy site was closed in two layers with 0 Vicryl interrupted sutures in posterior fascia and anterior fascia.   The entire team changed gowns and gloves and new equipment was used for closing per protocol.  The fascia was closed in the standard fashion  with 0 PDS suture. Exparel was injected. The wounds were irrigated. Hemostasis was achieved. She was overall oozing in nature.  The skin of the midline and colostomy were closed with staples. A honeycomb dressing and gauze and tegaderm were placed on the wounds.   Dr. Arnoldo Morale was assisting throughout the procedure and was present for the critical portions of the case.    Final inspection revealed acceptable hemostasis. All counts were correct at the end of the case. The patient was awakened from anesthesia and extubated without complication.  The patient went to the PACU in stable condition.   [name], MD [facility] [address] [place], [place] [address] [phone] (office)

## 2021-04-28 NOTE — Progress Notes (Signed)
[facility]  Update sister surgery completed. ERAS for pain, some chronic pain so will have to adjust as needed. Binder. Clear diet. Foley catheter. Cardiac monitor overnight.  [name], MD [facility] [address] [place], [place] [address] [phone] (office)

## 2021-04-29 ENCOUNTER — Encounter (HOSPITAL_COMMUNITY): Payer: Self-pay | Admitting: General Surgery

## 2021-04-29 LAB — CBC WITH DIFFERENTIAL/PLATELET
Abs Immature Granulocytes: 0.1 10*3/uL — ABNORMAL HIGH (ref 0.00–0.07)
Basophils Absolute: 0 10*3/uL (ref 0.0–0.1)
Basophils Relative: 0 %
Eosinophils Absolute: 0 10*3/uL (ref 0.0–0.5)
Eosinophils Relative: 0 %
HCT: 37.7 % (ref 36.0–46.0)
Hemoglobin: 12.1 g/dL (ref 12.0–15.0)
Immature Granulocytes: 1 %
Lymphocytes Relative: 14 %
Lymphs Abs: 2.2 10*3/uL (ref 0.7–4.0)
MCH: 27.1 pg (ref 26.0–34.0)
MCHC: 32.1 g/dL (ref 30.0–36.0)
MCV: 84.5 fL (ref 80.0–100.0)
Monocytes Absolute: 2.3 10*3/uL — ABNORMAL HIGH (ref 0.1–1.0)
Monocytes Relative: 14 %
Neutro Abs: 11.6 10*3/uL — ABNORMAL HIGH (ref 1.7–7.7)
Neutrophils Relative %: 71 %
Platelets: 91 10*3/uL — ABNORMAL LOW (ref 150–400)
RBC: 4.46 MIL/uL (ref 3.87–5.11)
RDW: 15.1 % (ref 11.5–15.5)
WBC: 16.2 10*3/uL — ABNORMAL HIGH (ref 4.0–10.5)
nRBC: 0 % (ref 0.0–0.2)

## 2021-04-29 LAB — BASIC METABOLIC PANEL
Anion gap: 7 (ref 5–15)
BUN: 9 mg/dL (ref 8–23)
CO2: 28 mmol/L (ref 22–32)
Calcium: 8.4 mg/dL — ABNORMAL LOW (ref 8.9–10.3)
Chloride: 99 mmol/L (ref 98–111)
Creatinine, Ser: 0.61 mg/dL (ref 0.44–1.00)
GFR, Estimated: >60 mL/min (ref >60)
Glucose, Bld: 138 mg/dL — ABNORMAL HIGH (ref 70–99)
Potassium: 3.9 mmol/L (ref 3.5–5.1)
Sodium: 134 mmol/L — ABNORMAL LOW (ref 135–145)

## 2021-04-29 LAB — HEMOGLOBIN A1C
Hgb A1c MFr Bld: 6.4 % — ABNORMAL HIGH (ref 4.8–5.6)
Mean Plasma Glucose: 137 mg/dL

## 2021-04-29 LAB — GLUCOSE, CAPILLARY: Glucose-Capillary: 134 mg/dL — ABNORMAL HIGH (ref 70–99)

## 2021-04-29 LAB — PHOSPHORUS: Phosphorus: 2.7 mg/dL (ref 2.5–4.6)

## 2021-04-29 LAB — MAGNESIUM: Magnesium: 1.9 mg/dL (ref 1.7–2.4)

## 2021-04-29 MED ORDER — SODIUM CHLORIDE 0.9 % IV SOLN
1.0000 g | INTRAVENOUS | Status: AC
  Administered 2021-04-29: 1 g via INTRAVENOUS
  Filled 2021-04-29 (×2): qty 1

## 2021-04-29 NOTE — Progress Notes (Signed)
[facility] Progress Note  1 Day Post-Op  Subjective: Had bloody Bm. Was anxious yesterday. Better today. Pain is better.   Objective: Vital signs in last 24 hours: Temp:  [97.3 F (36.3 C)-98.7 F (37.1 C)] 97.7 F (36.5 C) (06/21 0542) Pulse Rate:  [82-105] 102 (06/21 0542) Resp:  [11-21] 20 (06/21 0542) BP: (96-178)/(53-102) 116/85 (06/21 0542) SpO2:  [92 %-100 %] 100 % (06/21 0542) Weight:  [74.4 kg-82.8 kg] 82.8 kg (06/21 0500) Last BM Date: 04/27/21  Intake/Output from previous day: 06/20 0701 - 06/21 0700 In: 3870 [P.O.:720; I.V.:3050; IV Piggyback:100] Out: 900 [Urine:850; Blood:50] Intake/Output this shift: Total I/O In: -  Out: 300 [Urine:300]  General appearance: alert, cooperative, and no distress Resp: normal work of breathing GI: soft, mildly distended, appropriately tender, no erythema or drainage, dressing in place   Lab Results:  Recent Labs    04/29/21 0532  WBC 16.2*  HGB 12.1  HCT 37.7  PLT 91*   BMET Recent Labs    04/29/21 0532  NA 134*  K 3.9  CL 99  CO2 28  GLUCOSE 138*  BUN 9  CREATININE 0.61  CALCIUM 8.4*   Anti-infectives: Anti-infectives (From admission, onward)    Start     Dose/Rate Route Frequency Ordered Stop   04/29/21 1000  cefoTEtan (CEFOTAN) 1 g in sodium chloride 0.9 % 100 mL IVPB        1 g 200 mL/hr over 30 Minutes Intravenous Every 30 min 04/29/21 0855 04/29/21 1138   04/28/21 2000  cefoTEtan (CEFOTAN) 2 g in sodium chloride 0.9 % 100 mL IVPB  Status:  Discontinued        2 g 200 mL/hr over 30 Minutes Intravenous Every 12 hours 04/28/21 1608 04/29/21 0855   04/28/21 0630  cefoTEtan (CEFOTAN) 2 g in sodium chloride 0.9 % 100 mL IVPB        2 g 200 mL/hr over 30 Minutes Intravenous On call to O.R. 04/28/21 4696 04/28/21 0745       Assessment/Plan: Ms. Fowle is a 60 yo s/p colostomy reversal. Doing well overall. H&H drifting and platelets down and bloody BM so some post op bleeding. Will  monitor. ERAS pain, chronic pain Xanax for anxiety IS, OOB Needs to ambulate UOP good, Foley out Labs tomorrow SCDs, hold lovenox given thrombocytopenia, drop H&H     LOS: 1 day    Virl Cagey 04/29/2021

## 2021-04-30 ENCOUNTER — Encounter (HOSPITAL_COMMUNITY): Payer: Self-pay | Admitting: General Surgery

## 2021-04-30 DIAGNOSIS — K435 Parastomal hernia without obstruction or  gangrene: Secondary | ICD-10-CM

## 2021-04-30 DIAGNOSIS — Z933 Colostomy status: Secondary | ICD-10-CM

## 2021-04-30 LAB — CBC WITH DIFFERENTIAL/PLATELET
Abs Immature Granulocytes: 0.12 10*3/uL — ABNORMAL HIGH (ref 0.00–0.07)
Abs Immature Granulocytes: 0.13 10*3/uL — ABNORMAL HIGH (ref 0.00–0.07)
Basophils Absolute: 0.1 10*3/uL (ref 0.0–0.1)
Basophils Absolute: 0.1 10*3/uL (ref 0.0–0.1)
Basophils Relative: 0 %
Basophils Relative: 0 %
Eosinophils Absolute: 0.3 10*3/uL (ref 0.0–0.5)
Eosinophils Absolute: 0.3 10*3/uL (ref 0.0–0.5)
Eosinophils Relative: 2 %
Eosinophils Relative: 2 %
HCT: 30.8 % — ABNORMAL LOW (ref 36.0–46.0)
HCT: 28.2 % — ABNORMAL LOW (ref 36.0–46.0)
Hemoglobin: 9.7 g/dL — ABNORMAL LOW (ref 12.0–15.0)
Hemoglobin: 9.2 g/dL — ABNORMAL LOW (ref 12.0–15.0)
Immature Granulocytes: 1 %
Immature Granulocytes: 1 %
Lymphocytes Relative: 20 %
Lymphocytes Relative: 20 %
Lymphs Abs: 2.7 10*3/uL (ref 0.7–4.0)
Lymphs Abs: 2.9 10*3/uL (ref 0.7–4.0)
MCH: 27.1 pg (ref 26.0–34.0)
MCH: 27.7 pg (ref 26.0–34.0)
MCHC: 31.5 g/dL (ref 30.0–36.0)
MCHC: 32.6 g/dL (ref 30.0–36.0)
MCV: 86 fL (ref 80.0–100.0)
MCV: 84.9 fL (ref 80.0–100.0)
Monocytes Absolute: 1.4 10*3/uL — ABNORMAL HIGH (ref 0.1–1.0)
Monocytes Absolute: 1.2 10*3/uL — ABNORMAL HIGH (ref 0.1–1.0)
Monocytes Relative: 10 %
Monocytes Relative: 9 %
Neutro Abs: 8.8 10*3/uL — ABNORMAL HIGH (ref 1.7–7.7)
Neutro Abs: 9.7 10*3/uL — ABNORMAL HIGH (ref 1.7–7.7)
Neutrophils Relative %: 67 %
Neutrophils Relative %: 68 %
Platelets: 10 10*3/uL — CL (ref 150–400)
Platelets: 10 10*3/uL — CL (ref 150–400)
RBC: 3.58 MIL/uL — ABNORMAL LOW (ref 3.87–5.11)
RBC: 3.32 MIL/uL — ABNORMAL LOW (ref 3.87–5.11)
RDW: 15.6 % — ABNORMAL HIGH (ref 11.5–15.5)
RDW: 15.5 % (ref 11.5–15.5)
WBC: 13.2 10*3/uL — ABNORMAL HIGH (ref 4.0–10.5)
WBC: 14.2 10*3/uL — ABNORMAL HIGH (ref 4.0–10.5)
nRBC: 0 % (ref 0.0–0.2)
nRBC: 0 % (ref 0.0–0.2)

## 2021-04-30 LAB — COMPREHENSIVE METABOLIC PANEL
ALT: 25 U/L (ref 0–44)
AST: 20 U/L (ref 15–41)
Albumin: 2.8 g/dL — ABNORMAL LOW (ref 3.5–5.0)
Alkaline Phosphatase: 78 U/L (ref 38–126)
Anion gap: 6 (ref 5–15)
BUN: 9 mg/dL (ref 8–23)
CO2: 32 mmol/L (ref 22–32)
Calcium: 8.2 mg/dL — ABNORMAL LOW (ref 8.9–10.3)
Chloride: 96 mmol/L — ABNORMAL LOW (ref 98–111)
Creatinine, Ser: 0.63 mg/dL (ref 0.44–1.00)
GFR, Estimated: >60 mL/min (ref >60)
Glucose, Bld: 128 mg/dL — ABNORMAL HIGH (ref 70–99)
Potassium: 3.7 mmol/L (ref 3.5–5.1)
Sodium: 134 mmol/L — ABNORMAL LOW (ref 135–145)
Total Bilirubin: 0.5 mg/dL (ref 0.3–1.2)
Total Protein: 5.7 g/dL — ABNORMAL LOW (ref 6.5–8.1)

## 2021-04-30 LAB — MAGNESIUM: Magnesium: 2.1 mg/dL (ref 1.7–2.4)

## 2021-04-30 LAB — BASIC METABOLIC PANEL
Anion gap: 10 (ref 5–15)
BUN: 8 mg/dL (ref 8–23)
CO2: 28 mmol/L (ref 22–32)
Calcium: 8.2 mg/dL — ABNORMAL LOW (ref 8.9–10.3)
Chloride: 96 mmol/L — ABNORMAL LOW (ref 98–111)
Creatinine, Ser: 0.64 mg/dL (ref 0.44–1.00)
GFR, Estimated: >60 mL/min (ref >60)
Glucose, Bld: 116 mg/dL — ABNORMAL HIGH (ref 70–99)
Potassium: 3.5 mmol/L (ref 3.5–5.1)
Sodium: 134 mmol/L — ABNORMAL LOW (ref 135–145)

## 2021-04-30 LAB — SAVE SMEAR(SSMR), FOR PROVIDER SLIDE REVIEW: Smear Review: NONE SEEN

## 2021-04-30 LAB — DIC (DISSEMINATED INTRAVASCULAR COAGULATION)PANEL
D-Dimer, Quant: 2.3 ug/mL-FEU — ABNORMAL HIGH (ref 0.00–0.50)
Fibrinogen: 553 mg/dL — ABNORMAL HIGH (ref 210–475)
INR: 1.2 (ref 0.8–1.2)
Platelets: 10 10*3/uL — CL (ref 150–400)
Prothrombin Time: 15 seconds (ref 11.4–15.2)
Smear Review: NONE SEEN
aPTT: 29 seconds (ref 24–36)

## 2021-04-30 LAB — MRSA NEXT GEN BY PCR, NASAL: MRSA by PCR Next Gen: NOT DETECTED

## 2021-04-30 LAB — PHOSPHORUS: Phosphorus: 1.8 mg/dL — ABNORMAL LOW (ref 2.5–4.6)

## 2021-04-30 LAB — PREPARE RBC (CROSSMATCH)

## 2021-04-30 MED ORDER — SODIUM CHLORIDE 0.9% IV SOLUTION: Freq: Once | INTRAVENOUS | Status: DC

## 2021-04-30 MED ORDER — PANTOPRAZOLE SODIUM 40 MG PO TBEC
40.0000 mg | DELAYED_RELEASE_TABLET | Freq: Every day | ORAL | Status: DC
  Administered 2021-04-30 – 2021-05-05 (×6): 40 mg via ORAL
  Filled 2021-04-30 (×6): qty 1

## 2021-04-30 MED ORDER — SODIUM CHLORIDE 0.9% FLUSH: 10.0000 mL | INTRAVENOUS | Status: DC | PRN

## 2021-04-30 MED ORDER — SODIUM CHLORIDE 0.9% FLUSH
10.0000 mL | Freq: Two times a day (BID) | INTRAVENOUS | Status: DC
Start: 2021-04-30 — End: 2021-05-04
  Administered 2021-05-01 – 2021-05-04 (×3): 10 mL

## 2021-04-30 NOTE — Progress Notes (Signed)
[facility]  Also stopped entereg and famotidine due to thrombocytopenia. Protonix ordered.   [name], MD [facility] [address] [place], [place] [address] [phone] (office)

## 2021-04-30 NOTE — Progress Notes (Signed)
Date and time results received: 04/30/21 0730   Critical Value: Platelets 10.  Name of Provider Notified: Dr. Constance Haw

## 2021-04-30 NOTE — Progress Notes (Signed)
[facility]  Thrombocytopenia to 10 now. Worried HITT component and not just some bleeding post op. Will get hospitalist involved to help. Currently hemodynamically stable. Worried about bleeding from raw surfaces and platelet count of 10 but also if HITT giving blood or platelets could be detrimental too.   BP 110/69 (BP Location: Left Arm)   Pulse 98   Temp 98.3 F (36.8 C) (Oral)   Resp 20   Ht 5\' 8"  (1.727 m)   Wt 83.6 kg   SpO2 94%   BMI 28.02 kg/m  CBC    Component Value Date/Time   WBC 13.2 (H) 04/30/2021 0627   RBC 3.58 (L) 04/30/2021 0627   HGB 9.7 (L) 04/30/2021 0627   HGB 11.9 01/22/2021 1121   HCT 30.8 (L) 04/30/2021 0627   HCT 36.4 01/22/2021 1121   PLT 10 (LL) 04/30/2021 0627   PLT 331 01/22/2021 1121   MCV 86.0 04/30/2021 0627   MCV 86 01/22/2021 1121   MCH 27.1 04/30/2021 0627   MCHC 31.5 04/30/2021 0627   RDW 15.6 (H) 04/30/2021 0627   RDW 14.3 01/22/2021 1121   LYMPHSABS 2.7 04/30/2021 0627   LYMPHSABS 3.1 01/22/2021 1121   MONOABS 1.4 (H) 04/30/2021 0627   EOSABS 0.3 04/30/2021 0627   EOSABS 1.2 (H) 01/22/2021 1121   BASOSABS 0.1 04/30/2021 0627   BASOSABS 0.1 01/22/2021 [place], [place] [address] [place], [place] [address] [phone] (office)

## 2021-04-30 NOTE — Consult Note (Signed)
[facility] CONSULT NOTE  Patient Care Team: [name], DO as PCP - General (Family Medicine) Harl Bowie, Alphonse Guild, MD as PCP - Cardiology (Cardiology) Starling Manns, MD (Orthopedic Surgery) Arnoldo Lenis, MD as Consulting Physician (Cardiology)   ASSESSMENT & PLAN  Severe, acute thrombocytopenia, concern for heparin induced thrombocytopenia S/p colostomy reversal surgery Mild postop anemia History of childhood ITP  The patient has no clinical signs of thrombosis; overall, this is suspicious for acute heparin-induced thrombocytopenia, nonimmune mediated type I  At the present time, I agree with platelet transfusion support to keep platelet count above 10  I will order HIT panel and serotonin release assay  We will avoid all heparin containing products we will also avoid NSAID use  The patient & clinical staff are educated to watch out for signs and symptoms of bleeding as well as thrombosis   There is no need to consider anticoagulation therapy at this point  Acute ITP cannot be excluded although it would be rare in this clinical setting  I recommend close monitoring of platelet count daily over the next 24 to 48 hours  If her platelet count starts trending upwards over the next 2 days without transfusion support, greater than 50,000, she can be discharged with future follow-up in the clinic  Please call consult service tonight if emergent questions arise Unfortunately, I will not be able to check on her over the next few days but please call me if questions arise   All questions were answered. The patient knows to call the clinic with any problems, questions or concerns. No barriers to learning was detected. The total time spent in the appointment was 80 minutes encounter with patients including review of chart and various tests results, discussions about plan of care and coordination of care plan  Heath Lark, MD 04/30/2021 5:35 PM    CHIEF  COMPLAINTS/PURPOSE OF CONSULTATION:  Acute, severe thrombocytopenia  HISTORY OF PRESENTING ILLNESS:  [name] 60 y.o. female is seen at the request by primary service and hospitalist for evaluation and management of acute severe thrombocytopenia  His sister is available in the room.  The patient is alert, awake and pleasant.  Her pastor is also by the bedside At the time of evaluation, she has completed platelet transfusion support The patient had history of childhood ITP; she never required treatment or transfusion support to her knowledge  Over her lifetime, she had 15 surgeries.  She had multiple back surgery, 2 C-sections, perforated bowel surgery and others.  She has never required blood transfusion support and platelet transfusion support  Prior to admission, her platelet count was normal at 270,000 range.  Soon after surgery, she had acute drop in her platelet count to 91,000 yesterday and this morning is down to 10,000.  According to nursing staff, her surgical incisions are healing well with no signs of bleeding or bruising.  She denies epistaxis, hematuria or hematochezia  The patient denies history of liver disease (however, US abdomen showed potential fatty liver change), history of cardiac murmur/prior cardiovascular surgery or recent new medications.  She has been has exposed to heparin containing products from multiple prior procedures  She denies prior blood or platelet transfusions.   MEDICAL HISTORY:  Past Medical History:  Diagnosis Date   Abdominal pain    Allergy    seasonal   Anxiety    Arthritis    Blood in stool    Breast changes, fibrocystic    Bruises easily  Cardiomegaly 2018   Clotting disorder ([place])     itp , none since 1976, no current hematologist   Constipation    DDD (degenerative disc disease), cervical    with lumbar issues   Depression    Generalized headaches    GERD (gastroesophageal reflux disease)    Glaucoma    Hemorrhoid     Hyperlipidemia    Hypertension    Idiopathic thrombocytopenia (HCC)    as child   Leg swelling    both ankles   Liver lesion    Nasal congestion    Nausea & vomiting    Neuromuscular disorder (HCC)    back  injury   Neuromuscular disorder ([place])    3 neck surgeries,neck fusion.pin,plates,screws   Osteoporosis    Panic attacks    pt also  has germaphobia   Rectal bleeding    Seizures ([place]) 1998   stress induced, one time, none since, no seizure meds   Seizures ([place])    Stated she had 2 weeks ago,it was like a transient,stare.04/01/21 pt. denies   Spleen enlarged 1976, none since   r/t idiopathic thrombocytopenia   Trouble swallowing    Unintentional weight loss    Weakness    weakness varies    SURGICAL HISTORY: Past Surgical History:  Procedure Laterality Date   ABDOMINAL HYSTERECTOMY  1997   BACK SURGERY     CERVICAL SPINE SURGERY  2005, 2007, 2010   x3, fusion done,plates and screws present   [place]  03/14/2012   Procedure: LAPAROSCOPIC CHOLECYSTECTOMY;  Surgeon: Stark Klein, MD;  Location: [facility];  Service: General;  Laterality: N/A;   COLECTOMY WITH COLOSTOMY CREATION/HARTMANN PROCEDURE N/A 12/21/2020   Procedure: COLECTOMY WITH COLOSTOMY CREATION/HARTMANN PROCEDURE;  Surgeon: Virl Cagey, MD;  Location: [facility];  Service: General;  Laterality: N/A;   colonscopy and esophagogastrodudononescopy  02-04-12   COLOSTOMY REVERSAL N/A 04/28/2021   Procedure: COLOSTOMY REVERSAL;  Surgeon: Virl Cagey, MD;  Location: [facility];  Service: General;  Laterality: N/A;   IR RADIOLOGIST EVAL & MGMT  01/15/2021   LUMBAR [place] SURGERY  11/2014    SOCIAL HISTORY: Social History   Socioeconomic History   Marital status: Married    Spouse name: Legrand Como   Number of children: 3   Years of education: 14   Highest education level: Associate degree: academic program  Occupational History   Occupation: Corporate investment banker: UNEMPLOYED   Tobacco Use   Smoking status: Every Day    Packs/day: 1.00    Years: 12.00    Pack years: 12.00    Types: Cigarettes   Smokeless tobacco: Never  Vaping Use   Vaping Use: Former   Quit date: 09/07/2016   Devices: only used for about a month  Substance and Sexual Activity   Alcohol use: No    Alcohol/week: 0.0 standard drinks   Drug use: Yes    Types: Marijuana   Sexual activity: Yes  Other Topics Concern   Not on file  Social History Narrative   She lives with her boyfriend.  She used to smoke, quit May 2013, she is not drinking. She has 3 children   Social Determinants of Radio broadcast assistant Strain: Not on file  Food Insecurity: Not on file  Transportation Needs: Not on file  Physical Activity: Not on file  Stress: Not on file  Social Connections: Not on file  Intimate Partner Violence: Not on  file    FAMILY HISTORY: Family History  Problem Relation Age of Onset   Hypertension Mother    Heart disease Mother    Lung cancer Father    Cancer Father        lung   Colon cancer Paternal Grandmother    Cancer Paternal Grandmother        colon   Heart disease Maternal Grandmother    Diabetes Sister    Bipolar disorder Sister    Heart disease Sister        Congestive Heart Failure   Hypertension Sister    Esophageal cancer Neg Hx    Rectal cancer Neg Hx    Stomach cancer Neg Hx     ALLERGIES:  is allergic to nsaids.  MEDICATIONS:  Current Facility-Administered Medications  Medication Dose Route Frequency Provider Last Rate Last Admin   0.9 %  sodium chloride infusion (Manually program via Guardrails IV Fluids)   Intravenous Once Murlean Iba, MD   Held at 04/30/21 1441   0.9 %  sodium chloride infusion (Manually program via Guardrails IV Fluids)   Intravenous Once Murlean Iba, MD   Held at 04/30/21 1441   acetaminophen (TYLENOL) tablet 1,000 mg  1,000 mg Oral Q6H Virl Cagey, MD   1,000 mg at 04/30/21 1655   ALPRAZolam (XANAX)  tablet 1 mg  1 mg Oral TID PRN Virl Cagey, MD   1 mg at 04/30/21 1356   alum & mag hydroxide-simeth (MAALOX/MYLANTA) 200-200-20 MG/5ML suspension 30 mL  30 mL Oral Q6H PRN Virl Cagey, MD   30 mL at 04/29/21 1037   amitriptyline (ELAVIL) tablet 50 mg  50 mg Oral QHS Virl Cagey, MD   50 mg at 04/29/21 2103   atenolol (TENORMIN) tablet 50 mg  50 mg Oral Daily Virl Cagey, MD   50 mg at 04/30/21 0848   busPIRone (BUSPAR) tablet 15 mg  15 mg Oral TID Virl Cagey, MD   15 mg at 04/30/21 1534   Chlorhexidine Gluconate Cloth 2 % PADS 6 each  6 each Topical Daily Virl Cagey, MD   6 each at 04/30/21 0904   citalopram (CELEXA) tablet 40 mg  40 mg Oral Q breakfast Virl Cagey, MD   40 mg at 04/30/21 0850   diphenhydrAMINE (BENADRYL) 12.5 MG/5ML elixir 12.5 mg  12.5 mg Oral Q6H PRN Virl Cagey, MD       Or   diphenhydrAMINE (BENADRYL) injection 12.5 mg  12.5 mg Intravenous Q6H PRN Virl Cagey, MD       feeding supplement (ENSURE SURGERY) liquid 237 mL  237 mL Oral BID BM Virl Cagey, MD       gabapentin (NEURONTIN) capsule 300 mg  300 mg Oral BID Virl Cagey, MD   300 mg at 04/30/21 2951   lactated ringers infusion   Intravenous Continuous Virl Cagey, MD 50 mL/hr at 04/30/21 1705 New Bag at 04/30/21 1705   latanoprost (XALATAN) 0.005 % ophthalmic solution 1 drop  1 drop Both Eyes QHS Virl Cagey, MD   1 drop at 04/29/21 2105   loratadine (CLARITIN) tablet 10 mg  10 mg Oral Daily Virl Cagey, MD   10 mg at 04/30/21 0849   metoprolol tartrate (LOPRESSOR) injection 5 mg  5 mg Intravenous Q6H PRN Virl Cagey, MD       morphine 2 MG/ML injection 2 mg  2 mg Intravenous Q3H  PRN Virl Cagey, MD   2 mg at 04/30/21 1655   nitroGLYCERIN (NITROSTAT) SL tablet 0.4 mg  0.4 mg Sublingual Q5 min PRN Virl Cagey, MD       ondansetron [facility]) tablet 4 mg  4 mg Oral Q6H PRN Virl Cagey, MD   4 mg at  04/29/21 0055   Or   ondansetron (ZOFRAN) injection 4 mg  4 mg Intravenous Q6H PRN Virl Cagey, MD   4 mg at 04/29/21 1037   oxyCODONE (Oxy IR/ROXICODONE) immediate release tablet 5-10 mg  5-10 mg Oral Q4H PRN Virl Cagey, MD   10 mg at 04/30/21 1410   pantoprazole (PROTONIX) EC tablet 40 mg  40 mg Oral Daily Virl Cagey, MD   40 mg at 04/30/21 1655   saccharomyces boulardii (FLORASTOR) capsule 250 mg  250 mg Oral BID Virl Cagey, MD   250 mg at 04/30/21 0850   simethicone (MYLICON) chewable tablet 40 mg  40 mg Oral Q6H PRN Virl Cagey, MD   40 mg at 04/29/21 0543   tiZANidine (ZANAFLEX) tablet 4 mg  4 mg Oral Q8H PRN Virl Cagey, MD   4 mg at 04/30/21 0349   traZODone (DESYREL) tablet 150 mg  150 mg Oral QHS Virl Cagey, MD   150 mg at 04/29/21 2103    REVIEW OF SYSTEMS:   Constitutional: Denies fevers, chills or abnormal night sweats Eyes: Denies blurriness of vision, double vision or watery eyes Ears, nose, mouth, throat, and face: Denies mucositis or sore throat Respiratory: Denies cough, dyspnea or wheezes Cardiovascular: Denies palpitation, chest discomfort or lower extremity swelling Gastrointestinal:  Denies nausea, heartburn or change in bowel habits Skin: Denies abnormal skin rashes Lymphatics: Denies new lymphadenopathy or easy bruising Neurological:Denies numbness, tingling or new weaknesses Behavioral/Psych: Mood is stable, no new changes  All other systems were reviewed with the patient and are negative.  PHYSICAL EXAMINATION: ECOG PERFORMANCE STATUS: 1 - Symptomatic but completely ambulatory  Vitals:   04/30/21 1649 04/30/21 1700  BP: 107/67 107/67  Pulse: 90 89  Resp: 20 20  Temp: 98.2 F (36.8 C)   SpO2:  98%   Filed Weights   04/29/21 0500 04/30/21 0423 04/30/21 1442  Weight: 182 lb 8.7 oz (82.8 kg) 184 lb 4.9 oz (83.6 kg) 174 lb 9.7 oz (79.2 kg)    GENERAL:alert, no distress and comfortable SKIN: skin color,  texture, turgor are normal, no rashes or significant lesions EYES: normal, conjunctiva are pink and non-injected, sclera clear OROPHARYNX:no exudate, no erythema and lips, buccal mucosa, and tongue normal  NECK: supple, thyroid normal size, non-tender, without nodularity LYMPH:  no palpable lymphadenopathy in the cervical, axillary or inguinal LUNGS: clear to auscultation and percussion with normal breathing effort HEART: regular rate & rhythm and no murmurs and no lower extremity edema ABDOMEN:abdomen soft, non-tender and normal bowel sounds Musculoskeletal:no cyanosis of digits and no clubbing  PSYCH: alert & oriented x 3 with fluent speech NEURO: no focal motor/sensory deficits  LABORATORY DATA:  I have reviewed the data as listed Lab Results  Component Value Date   WBC 14.2 (H) 04/30/2021   HGB 9.2 (L) 04/30/2021   HCT 28.2 (L) 04/30/2021   MCV 84.9 04/30/2021   PLT 10 (LL) 04/30/2021   PLT 10 (LL) 04/30/2021    RADIOGRAPHIC STUDIES: I have reviewed her prior CT imaging I have personally reviewed the radiological images as listed and agreed with the findings in  the report.

## 2021-04-30 NOTE — Progress Notes (Signed)
Patient transferred to [address] via wheelchair. A&Ox4 at time of transfer. Report called to Joellen Jersey, Therapist, sports.

## 2021-04-30 NOTE — Progress Notes (Signed)
[facility]  Checked on patient. Has had 1 pack platelet and moved to [facility] for monitoring. She is doing well and feels ok. Updated sister at bedside.   Hematology suppose to be coming this afternoon/ evening once completed at [facility]. Appreciate assistance.   [name], MD [facility] [address], [place] [address] [phone] (office)

## 2021-04-30 NOTE — Progress Notes (Signed)
[facility] Progress Note  2 Days Post-Op  Subjective: Thrombocytopenia noted yesterday and lovenox held, down again today to 10, and consulted Hospitalist/ Heme Onc for help. Remote history of ITP as child and no requirement for splenectomy, has been normal her adult life without issues for bleeding etc. Had normal platelets last hospitalization and no issues.   DIC, HITT panel etc has been ordered.   Received lovenox preop per colectomy protocols.   Objective: Vital signs in last 24 hours: Temp:  [98 F (36.7 C)-98.3 F (36.8 C)] 98.3 F (36.8 C) (06/22 0406) Pulse Rate:  [92-98] 98 (06/22 0406) Resp:  [16-20] 20 (06/22 0406) BP: (106-110)/(69) 110/69 (06/22 0406) SpO2:  [92 %-94 %] 94 % (06/22 0406) Weight:  [83.6 kg] 83.6 kg (06/22 0423) Last BM Date: 04/27/21  Intake/Output from previous day: 06/21 0701 - 06/22 0700 In: 240 [P.O.:240] Out: 300 [Urine:300] Intake/Output this shift: No intake/output data recorded.  General appearance: alert, cooperative, and no distress Resp: normal work of breathing GI: soft, distended, tender, honeycomb in place and no erythema or drainage, no bleeding, dressing clean Extremities: no swelling  Lab Results:  Recent Labs    04/30/21 0627 04/30/21 0922  WBC 13.2* 14.2*  HGB 9.7* 9.2*  HCT 30.8* 28.2*  PLT 10* 10*  10*   BMET Recent Labs    04/30/21 0440 04/30/21 0922  NA 134* 134*  K 3.5 3.7  CL 96* 96*  CO2 28 32  GLUCOSE 116* 128*  BUN 8 9  CREATININE 0.64 0.63  CALCIUM 8.2* 8.2*   PT/INR Recent Labs    04/30/21 0922  LABPROT 15.0  INR 1.2    Studies/Results: No results found.  Anti-infectives: Anti-infectives (From admission, onward)    Start     Dose/Rate Route Frequency Ordered Stop   04/29/21 1000  cefoTEtan (CEFOTAN) 1 g in sodium chloride 0.9 % 100 mL IVPB        1 g 200 mL/hr over 30 Minutes Intravenous Every 30 min 04/29/21 0855 04/29/21 1138   04/28/21 2000  cefoTEtan  (CEFOTAN) 2 g in sodium chloride 0.9 % 100 mL IVPB  Status:  Discontinued        2 g 200 mL/hr over 30 Minutes Intravenous Every 12 hours 04/28/21 1608 04/29/21 0855   04/28/21 0630  cefoTEtan (CEFOTAN) 2 g in sodium chloride 0.9 % 100 mL IVPB        2 g 200 mL/hr over 30 Minutes Intravenous On call to O.R. 04/28/21 0624 04/28/21 0745       Assessment/Plan: Ms. Lovecchio is a 60 yo s/p colostomy reversal after colostomy in February for stercoral ulcer. She has thrombocytopenia and history of ITP. H&H stable this AM and platelets stable. Work up pending. PRN For pain, chronic pain Xanax for anxiety IS, OOB Transfer to Stepdown given risk of bleeding, thrombocytopenia, risk of spontaneous bleeding Transfusing platelets, Heme Onc and Hospitalist helping Clear diet, having flatus but no BM, if having post op bleeding intraabdominal will cause ileus  Had some bloody clots POD 1 but not passing anything now SCDs DIC, HITT work up etc pending   Discussed with patient who is aware of everything. Will try to get another IV for access. Will do fall precautions as we do not want to fall and have bleeding.    LOS: 2 days    Virl Cagey 04/30/2021

## 2021-04-30 NOTE — Consult Note (Signed)
[facility] Consult Note  [name] [id number]:[id number]  Patients out patient PCP is [name], DO Consult requested in the Hospital by Virl Cagey, MD, On 04/30/2021  Reason for consult: evaluation and management of acute thrombocytopenia    With History of - Principal Problem:   Colostomy in place [facility]) Active Problems:   Parastomal hernia without obstruction or gangrene  Past Medical History:  Diagnosis Date   Abdominal pain    Allergy    seasonal   Anxiety    Arthritis    Blood in stool    Breast changes, fibrocystic    Bruises easily    Cardiomegaly 2018   Clotting disorder ([place])     itp , none since 1976, no current hematologist   Constipation    DDD (degenerative disc disease), cervical    with lumbar issues   Depression    Generalized headaches    GERD (gastroesophageal reflux disease)    Glaucoma    Hemorrhoid    Hyperlipidemia    Hypertension    Idiopathic thrombocytopenia ([place])    as child   Leg swelling    both ankles   Liver lesion    Nasal congestion    Nausea & vomiting    Neuromuscular disorder ([place])    back  injury   Neuromuscular disorder ([place])    3 neck surgeries,neck fusion.pin,plates,screws   Osteoporosis    Panic attacks    pt also  has germaphobia   Rectal bleeding    Seizures ([place]) 1998   stress induced, one time, none since, no seizure meds   Seizures ([place])    Stated she had 2 weeks ago,it was like a transient,stare.04/01/21 pt. denies   Spleen enlarged 1976, none since   r/t idiopathic thrombocytopenia   Trouble swallowing    Unintentional weight loss    Weakness    weakness varies     Past Surgical History:  Procedure Laterality Date   ABDOMINAL HYSTERECTOMY  1997   BACK SURGERY     CERVICAL SPINE SURGERY  2005, 2007, 2010   x3, fusion done,plates and screws present   [place]  03/14/2012   Procedure: LAPAROSCOPIC CHOLECYSTECTOMY;  Surgeon:  Stark Klein, MD;  Location: [facility];  Service: General;  Laterality: N/A;   COLECTOMY WITH COLOSTOMY CREATION/HARTMANN PROCEDURE N/A 12/21/2020   Procedure: COLECTOMY WITH COLOSTOMY CREATION/HARTMANN PROCEDURE;  Surgeon: Virl Cagey, MD;  Location: [facility];  Service: General;  Laterality: N/A;   colonscopy and esophagogastrodudononescopy  02-04-12   COLOSTOMY REVERSAL N/A 04/28/2021   Procedure: COLOSTOMY REVERSAL;  Surgeon: Virl Cagey, MD;  Location: [facility];  Service: General;  Laterality: N/A;   IR RADIOLOGIST EVAL & MGMT  01/15/2021   LUMBAR [place] SURGERY  11/2014    Past Surgical History:  Procedure Laterality Date   ABDOMINAL HYSTERECTOMY  [place]  2005, 2007, 2010   x3, fusion done,plates and screws present   [place]  03/14/2012   Procedure: LAPAROSCOPIC CHOLECYSTECTOMY;  Surgeon: Stark Klein, MD;  Location: [facility];  Service: General;  Laterality: N/A;   COLECTOMY WITH COLOSTOMY CREATION/HARTMANN PROCEDURE N/A 12/21/2020   Procedure: COLECTOMY WITH COLOSTOMY CREATION/HARTMANN PROCEDURE;  Surgeon: Virl Cagey, MD;  Location: [facility];  Service: General;  Laterality: N/A;   colonscopy and esophagogastrodudononescopy  02-04-12   COLOSTOMY REVERSAL N/A 04/28/2021  Procedure: COLOSTOMY REVERSAL;  Surgeon: Virl Cagey, MD;  Location: [facility];  Service: General;  Laterality: N/A;   IR RADIOLOGIST EVAL & MGMT  01/15/2021   LUMBAR [place] SURGERY  11/2014    HPI:-  [name] [id number]:[id number] is a 60 y.o. female, reportedly with history of ITP since age 89 that has not caused her problems over her life presented for admission by Dr. Constance Haw.  She apparently had a perforated stercoral ulcer and has had a colostomy in place since February 2022.  She presented for colostomy reversal.  Her platelet counts had been normal up until recently on 04/29/2021 her platelets were noted to be 91 and  postoperative on 04/30/2021 her platelets had dropped down to 10.  She is not having any active bleeding at this time and she reports no significant bruising.  I spoke with hematologist on-call Dr. Alvy Bimler who recommended repeating the CBC to confirm and getting a peripheral smear and DIC labs and liver function labs.  Hematology will consult on patient later today.  Review of Systems   Constitutional: negative Respiratory: negative Cardiovascular: negative Gastrointestinal: negative Hematologic/lymphatic: positive for easy bruising and petechiae Musculoskeletal:negative Neurological: negative Behavioral/Psych: negative Endocrine: negative Allergic/Immunologic: negative  Social History Social History   Tobacco Use   Smoking status: Every Day    Packs/day: 1.00    Years: 12.00    Pack years: 12.00    Types: Cigarettes   Smokeless tobacco: Never  Substance Use Topics   Alcohol use: No    Alcohol/week: 0.0 standard drinks     Family History Family History  Problem Relation Age of Onset   Hypertension Mother    Heart disease Mother    Lung cancer Father    Cancer Father        lung   Colon cancer Paternal Grandmother    Cancer Paternal Grandmother        colon   Heart disease Maternal Grandmother    Diabetes Sister    Bipolar disorder Sister    Heart disease Sister        Congestive Heart Failure   Hypertension Sister    Esophageal cancer Neg Hx    Rectal cancer Neg Hx    Stomach cancer Neg Hx      Prior to Admission medications   Medication Sig Start Date End Date Taking? Authorizing Provider  amitriptyline (ELAVIL) 25 MG tablet Take 25-50 mg by mouth See admin instructions. Take 50 mg by mouth at bedtime, may take 25 mg during the day if needed for pain 08/04/19  Yes [provider]  atenolol (TENORMIN) 50 MG tablet Take 1 tablet (50 mg total) by mouth daily. 09/06/20  Yes Gottschalk, Ashly M, DO  busPIRone (BUSPAR) 15 MG tablet Take 1 tablet (15 mg  total) by mouth 3 (three) times daily. 09/06/20  Yes Gottschalk, Leatrice Jewels M, DO  cetirizine (ZYRTEC) 10 MG tablet Take 10 mg by mouth daily.   Yes [provider]  citalopram (CELEXA) 40 MG tablet Take 1 tablet (40 mg total) by mouth daily with breakfast. 09/06/20  Yes Gottschalk, Ashly M, DO  famotidine (PEPCID) 20 MG tablet Take 1 tablet (20 mg total) by mouth daily as needed for heartburn or indigestion. Patient taking differently: Take 20 mg by mouth daily. 08/16/20  Yes Milus Banister, MD  ibuprofen (ADVIL) 800 MG tablet Take 800 mg by mouth every 8 (eight) hours as needed for moderate pain. 03/06/21  Yes [provider]  latanoprost Ivin Poot)  0.005 % ophthalmic solution Place 1 drop into both eyes at bedtime. 12/13/20  Yes [provider]  lisinopril-hydrochlorothiazide (ZESTORETIC) 20-25 MG tablet TAKE 1 TABLET BY MOUTH EVERY DAY Patient taking differently: Take 1 tablet by mouth daily. 10/18/20  Yes Branch, Alphonse Guild, MD  nitroGLYCERIN (NITROSTAT) 0.4 MG SL tablet Place 1 tablet (0.4 mg total) under the tongue every 5 (five) minutes as needed for chest pain. 05/30/20 04/18/21 Yes Gottschalk, Ashly M, DO  ondansetron (ZOFRAN) 4 MG tablet Take 4 mg by mouth every 8 (eight) hours as needed for nausea. 07/29/19  Yes [provider]  Oxycodone HCl 10 MG TABS Take 1 tablet (10 mg total) by mouth 2 (two) times daily. Patient taking differently: Take 10 mg by mouth in the morning, at noon, in the evening, and at bedtime. 12/28/20  Yes Sakai, Isami, DO  Polyethylene Glycol 3350 (MIRALAX PO) Take 17 g by mouth in the morning and at bedtime.   Yes [provider]  tiZANidine (ZANAFLEX) 4 MG tablet Take 4 mg by mouth every 8 (eight) hours as needed for muscle spasms. 10/17/20  Yes [provider]  traZODone (DESYREL) 100 MG tablet TAKE 1 AND 1/2 TABLET (150 MG TOTAL) BY MOUTH AT BEDTIME AS NEEDED FOR SLEEP. Patient taking differently: Take 150 mg by mouth at  bedtime. 02/12/21  Yes Gottschalk, Leatrice Jewels M, DO  rosuvastatin (CRESTOR) 20 MG tablet Take 1 tablet (20 mg total) by mouth daily. Patient not taking: Reported on 04/18/2021 06/03/20   [name], DO  hydrochlorothiazide (HYDRODIURIL) 25 MG tablet Take 25 mg by mouth daily.  05/20/20  [provider]    Allergies  Allergen Reactions   Nsaids Nausea Only    Abdominal pain   Physical Exam  Intake/Output Summary (Last 24 hours) at 04/30/2021 1007 Last data filed at 04/29/2021 1900 Gross per 24 hour  Intake 240 ml  Output 300 ml  Net -60 ml   Blood pressure 110/69, pulse 98, temperature 98.3 F (36.8 C), temperature source Oral, resp. rate 20, height 5\' 8"  (1.727 m), weight 83.6 kg, SpO2 94 %.  BP 110/69 (BP Location: Left Arm)   Pulse 98   Temp 98.3 F (36.8 C) (Oral)   Resp 20   Ht 5\' 8"  (1.727 m)   Wt 83.6 kg   SpO2 94%   BMI 28.02 kg/m   General Appearance:    Alert, cooperative, no distress, appears stated age  Head:    Normocephalic, without obvious abnormality, atraumatic  Eyes:    PERRL, conjunctiva/corneas clear, EOM's intact, fundi    benign, both eyes  Ears:    Normal TM's and external ear canals, both ears  Nose:   Nares normal, septum midline, mucosa normal, no drainage    or sinus tenderness  Throat:   Lips, mucosa, and tongue normal; teeth and gums normal  Neck:   Supple, symmetrical, trachea midline, no adenopathy;    thyroid:  no enlargement/tenderness/nodules; no carotid   bruit or JVD  Back:     Symmetric, no curvature, ROM normal, no CVA tenderness  Lungs:     Clear to auscultation bilaterally, respirations unlabored  Chest Wall:    No tenderness or deformity   Heart:    Regular rate and rhythm, S1 and S2 normal, no murmur, rub   or gallop  Breast Exam:    Deferred   Abdomen:     Soft, nondistended, mild TTP, no guarding, bandages clean and dry.   Genitalia:  Deferred  Rectal:    Deferred  Extremities:   Extremities normal, atraumatic, no  cyanosis or edema  Pulses:   2+ and symmetric all extremities  Skin:   Skin color, texture, turgor normal, no rashes or lesions  Lymph nodes:   Cervical, supraclavicular, and axillary nodes normal  Neurologic:   CNII-XII intact, normal strength, sensation and reflexes    throughout    Data Review CBC w Diff:  Lab Results  Component Value Date   WBC 14.2 (H) 04/30/2021   HGB 9.2 (L) 04/30/2021   HGB 11.9 01/22/2021   HCT 28.2 (L) 04/30/2021   HCT 36.4 01/22/2021   PLT 10 (LL) 04/30/2021   PLT 10 (LL) 04/30/2021   PLT 331 01/22/2021   LYMPHOPCT 20 04/30/2021   BANDSPCT 2 12/26/2020   MONOPCT 9 04/30/2021   EOSPCT 2 04/30/2021   BASOPCT 0 04/30/2021    CMP:  Lab Results  Component Value Date   NA 134 (L) 04/30/2021   NA 137 01/16/2021   K 3.7 04/30/2021   CL 96 (L) 04/30/2021   CO2 32 04/30/2021   BUN 9 04/30/2021   BUN 6 (L) 01/16/2021   CREATININE 0.63 04/30/2021   CREATININE 1.06 (H) 01/09/2021   PROT 5.7 (L) 04/30/2021   PROT 7.2 05/30/2020   ALBUMIN 2.8 (L) 04/30/2021   ALBUMIN 4.5 05/30/2020   BILITOT 0.5 04/30/2021   BILITOT 0.3 05/30/2020   ALKPHOS 78 04/30/2021   AST 20 04/30/2021   ALT 25 04/30/2021    Coagulation:  Lab Results  Component Value Date   INR 1.2 04/30/2021    Cardiac markers:  Lab Results  Component Value Date   TROPONINI <0.03 06/30/2017     Assessment & Plan  Acute thrombocytopenia / ITP -I spoke with hematologist and she recommended repeating CBC and obtaining a peripheral smear, in addition to checking DIC lab panel.  Hematology will consult later today. Holding all heparin products for now.   Leukocytosis - reactive from surgery, follow with diff.  ABLA - postop - follow with serial CBC testing.  Postop day #1 s/p colostomy reversal - management per surgery team.  Essential hypertension - stable, well controlled History of splenomegaly - most recent CT reported normal spleen appearance.  History of epilepsy - stable on  gabapentin.  Tobacco - counseled on smoking cessation at bedside.    Thank you for the consult, [facility] will follow the patient with you.  Jusitn Salsgiver James Ivanoff, MD How to contact the [facility] Attending or Consulting provider [place] or covering provider during after hours [place], for this patient?  Check the care team in [facility] and look for a) attending/consulting [facility] provider listed and b) the [facility] team listed Log into [email or web address] and use [place]'s universal password to access. If you do not have the password, please contact the hospital operator. Locate the [facility] provider you are looking for under [facility] and page to a number that you can be directly reached. If you still have difficulty reaching the provider, please page the [facility] (Director on Call) for the Hospitalists listed on amion for assistance.

## 2021-05-01 ENCOUNTER — Inpatient Hospital Stay (HOSPITAL_COMMUNITY): Payer: Medicare HMO

## 2021-05-01 DIAGNOSIS — K219 Gastro-esophageal reflux disease without esophagitis: Secondary | ICD-10-CM

## 2021-05-01 DIAGNOSIS — D696 Thrombocytopenia, unspecified: Secondary | ICD-10-CM

## 2021-05-01 DIAGNOSIS — E782 Mixed hyperlipidemia: Secondary | ICD-10-CM

## 2021-05-01 DIAGNOSIS — F17200 Nicotine dependence, unspecified, uncomplicated: Secondary | ICD-10-CM

## 2021-05-01 LAB — BPAM RBC
Blood Product Expiration Date: 202206282359
Blood Product Expiration Date: 202206292359
Unit Type and Rh: 9500
Unit Type and Rh: 9500

## 2021-05-01 LAB — COMPREHENSIVE METABOLIC PANEL
ALT: 20 U/L (ref 0–44)
AST: 20 U/L (ref 15–41)
Albumin: 2.5 g/dL — ABNORMAL LOW (ref 3.5–5.0)
Alkaline Phosphatase: 66 U/L (ref 38–126)
Anion gap: 5 (ref 5–15)
BUN: 8 mg/dL (ref 8–23)
CO2: 32 mmol/L (ref 22–32)
Calcium: 7.6 mg/dL — ABNORMAL LOW (ref 8.9–10.3)
Chloride: 98 mmol/L (ref 98–111)
Creatinine, Ser: 0.51 mg/dL (ref 0.44–1.00)
GFR, Estimated: >60 mL/min (ref >60)
Glucose, Bld: 98 mg/dL (ref 70–99)
Potassium: 3.4 mmol/L — ABNORMAL LOW (ref 3.5–5.1)
Sodium: 135 mmol/L (ref 135–145)
Total Bilirubin: 0.7 mg/dL (ref 0.3–1.2)
Total Protein: 5.3 g/dL — ABNORMAL LOW (ref 6.5–8.1)

## 2021-05-01 LAB — CBC WITH DIFFERENTIAL/PLATELET
Abs Immature Granulocytes: 0.1 10*3/uL — ABNORMAL HIGH (ref 0.00–0.07)
Abs Immature Granulocytes: 0.55 10*3/uL — ABNORMAL HIGH (ref 0.00–0.07)
Basophils Absolute: 0 10*3/uL (ref 0.0–0.1)
Basophils Absolute: 0.1 10*3/uL (ref 0.0–0.1)
Basophils Relative: 0 %
Basophils Relative: 1 %
Eosinophils Absolute: 0.7 10*3/uL — ABNORMAL HIGH (ref 0.0–0.5)
Eosinophils Absolute: 0.7 10*3/uL — ABNORMAL HIGH (ref 0.0–0.5)
Eosinophils Relative: 6 %
Eosinophils Relative: 4 %
HCT: 23.3 % — ABNORMAL LOW (ref 36.0–46.0)
HCT: 33.7 % — ABNORMAL LOW (ref 36.0–46.0)
Hemoglobin: 7.6 g/dL — ABNORMAL LOW (ref 12.0–15.0)
Hemoglobin: 11.4 g/dL — ABNORMAL LOW (ref 12.0–15.0)
Immature Granulocytes: 1 %
Immature Granulocytes: 4 %
Lymphocytes Relative: 22 %
Lymphocytes Relative: 18 %
Lymphs Abs: 2.2 10*3/uL (ref 0.7–4.0)
Lymphs Abs: 2.7 10*3/uL (ref 0.7–4.0)
MCH: 27.7 pg (ref 26.0–34.0)
MCH: 28.5 pg (ref 26.0–34.0)
MCHC: 32.6 g/dL (ref 30.0–36.0)
MCHC: 33.8 g/dL (ref 30.0–36.0)
MCV: 85 fL (ref 80.0–100.0)
MCV: 84.3 fL (ref 80.0–100.0)
Monocytes Absolute: 0.9 10*3/uL (ref 0.1–1.0)
Monocytes Absolute: 1.4 10*3/uL — ABNORMAL HIGH (ref 0.1–1.0)
Monocytes Relative: 9 %
Monocytes Relative: 9 %
Neutro Abs: 6.2 10*3/uL (ref 1.7–7.7)
Neutro Abs: 9.5 10*3/uL — ABNORMAL HIGH (ref 1.7–7.7)
Neutrophils Relative %: 62 %
Neutrophils Relative %: 64 %
Platelets: 8 10*3/uL — CL (ref 150–400)
Platelets: 41 10*3/uL — ABNORMAL LOW (ref 150–400)
RBC: 2.74 MIL/uL — ABNORMAL LOW (ref 3.87–5.11)
RBC: 4 MIL/uL (ref 3.87–5.11)
RDW: 15.5 % (ref 11.5–15.5)
RDW: 14.5 % (ref 11.5–15.5)
WBC: 10.1 10*3/uL (ref 4.0–10.5)
WBC: 14.9 10*3/uL — ABNORMAL HIGH (ref 4.0–10.5)
nRBC: 0 % (ref 0.0–0.2)
nRBC: 0.1 % (ref 0.0–0.2)

## 2021-05-01 LAB — TYPE AND SCREEN
ABO/RH(D): O NEG
Antibody Screen: NEGATIVE
Unit division: 0
Unit division: 0

## 2021-05-01 LAB — PROTIME-INR
INR: 1 (ref 0.8–1.2)
Prothrombin Time: 13 seconds (ref 11.4–15.2)

## 2021-05-01 LAB — FIBRINOGEN: Fibrinogen: 530 mg/dL — ABNORMAL HIGH (ref 210–475)

## 2021-05-01 LAB — APTT: aPTT: 21 seconds — ABNORMAL LOW (ref 24–36)

## 2021-05-01 MED ORDER — HYDRALAZINE HCL 20 MG/ML IJ SOLN: 10.0000 mg | INTRAMUSCULAR | Status: DC | PRN

## 2021-05-01 MED ORDER — SODIUM CHLORIDE 0.9% IV SOLUTION: Freq: Once | INTRAVENOUS | Status: AC

## 2021-05-01 NOTE — Progress Notes (Addendum)
[facility]  Patient with lower abdominal pain and feels like she needs to urinate and can't. She is distended. H&H down and platelets down. Could def be bleeding from oozing versus vessel bleed, could have retroperitoneal bleed.   Foley now CT a/p w/o now to assess for major intraperitoneal hematoma.  Received platelets.  Will be getting blood too.   ICU status now.  More platelets available. And will need intraoperatively if have to go back.   Discussing with Dr. Wynetta Emery and Dr. Alvy Bimler.  HIT panel still pending.   [name], MD [facility] [address] [place], [place] [address] [phone] (office)

## 2021-05-01 NOTE — Progress Notes (Addendum)
[facility]  CT reviewed with DR. Boles and hematoma in pelvis but only measuring about 180- 240mL of blood when he does his calculations. Low attenuation fluid in ostomy site and midline. Spleen is normal in size. No large quantity bleeding to explain the drop H&H and platelets.  Had bloody BM post op day 1 but has had brown Bms since that time. Reported to have 2 brown Bms overnight.  Given that this seems to be more consumptive and not related to post surgical bleeding (not 5 units of blood in abdomen) will get her transferred for further hematology care as we do not have hematology coverage this week at [facility].  Dr. Wynetta Emery is going to get her transferred to [facility]. I spoke with [facility] who will let Dr. Redmond Pulling know.  I appreciate everyone's help and am available at any time to discuss etc.  [name], [place] [address] [place], [place] [address] [phone] (office)

## 2021-05-01 NOTE — Progress Notes (Signed)
[facility]   Results for [name], [name] (MRN [id number]) as of 05/01/2021 17:11  Ref. Range 05/01/2021 16:12  WBC Latest Ref Range: 4.0 - 10.5 K/uL 14.9 (H)  RBC Latest Ref Range: 3.87 - 5.11 MIL/uL 4.00  Hemoglobin Latest Ref Range: 12.0 - 15.0 g/dL 11.4 (L)  HCT Latest Ref Range: 36.0 - 46.0 % 33.7 (L)  MCV Latest Ref Range: 80.0 - 100.0 fL 84.3  MCH Latest Ref Range: 26.0 - 34.0 pg 28.5  MCHC Latest Ref Range: 30.0 - 36.0 g/dL 33.8  RDW Latest Ref Range: 11.5 - 15.5 % 14.5  Platelets Latest Ref Range: 150 - 400 K/uL 41 (L)  nRBC Latest Ref Range: 0.0 - 0.2 % 0.1  Neutrophils Latest Units: % 64  Lymphocytes Latest Units: % 18  Monocytes Relative Latest Units: % 9  Eosinophil Latest Units: % 4  Basophil Latest Units: % 1  Immature Granulocytes Latest Units: % 4  NEUT# Latest Ref Range: 1.7 - 7.7 K/uL 9.5 (H)  Lymphocyte # Latest Ref Range: 0.7 - 4.0 K/uL 2.7  Monocyte # Latest Ref Range: 0.1 - 1.0 K/uL 1.4 (H)  Eosinophils Absolute Latest Ref Range: 0.0 - 0.5 K/uL 0.7 (H)  Basophils Absolute Latest Ref Range: 0.0 - 0.1 K/uL 0.1  Abs Immature Granulocytes Latest Ref Range: 0.00 - 0.07 K/uL 0.55 (H)  Fibrinogen Latest Ref Range: 210 - 475 mg/dL 530 (H)  Prothrombin Time Latest Ref Range: 11.4 - 15.2 seconds 13.0  INR Latest Ref Range: 0.8 - 1.2  1.0  APTT Latest Ref Range: 24 - 36 seconds 21 (L)    Labs all improving. INR wnl.  H&H up and platelets up.   Transfer still pending.  [name], MD [facility] [address] [place],  [id number] [phone] (office)

## 2021-05-01 NOTE — Progress Notes (Signed)
PROGRESS NOTE   [name]  DSK:[id number] DOB: 10-05-1961 DOA: 04/28/2021 PCP: [name], DO   No chief complaint on file.  Level of care: Progressive  Brief Admission History:  Remy Dia IOM:[id number],AGT:[id number] is a 60 y.o. female, reportedly with history of ITP since age 54 that has not caused her problems over her life presented for admission by Dr. Constance Haw.  She apparently had a perforated stercoral ulcer and has had a colostomy in place since February 2022.  She presented for colostomy reversal.  Her platelet counts had been normal up until recently on 04/29/2021 her platelets were noted to be 91 and postoperative on 04/30/2021 her platelets had dropped down to 10.  She is not having any active bleeding at this time and she reports no significant bruising. Pt was seen by Dr. Alvy Bimler on 6/22 and 1 unit platelets given.    Update 05/01/21.  Pt c/o abd pain and distension, STAT ordered by surgery with findings of a small pelvic hematoma 250 cc or less per radiology. Discussed with surgery no need to take to OR at this time.    Platelets down to 8.   I spoke with hematologist on-call Dr. Alvy Bimler and spoke with Dr. Constance Haw surgery and because we have no in person hematology coverage at [facility] we are transferrring patient to [facility] so that she can be seen and followed by Dr. Alvy Bimler who agreed to see patient in consultation.  Pt is being transfused 2 units platelets and 2 units PRBC.   [facility] will become attending and [facility] will consult when transferred to [facility]    Assessment & Plan:   Principal Problem:   Colostomy in place [facility]) Active Problems:   GAD (generalized anxiety disorder)   Depression   Essential hypertension   Epilepsy ([place])   Insomnia   Current smoker   GERD (gastroesophageal reflux disease)   Osteopenia   Obesity (BMI 30-39.9)   Hyperlipemia   Benzodiazepine dependence ([place])   Parastomal hernia without obstruction or gangrene  Acute severe thrombocytopenia - I  have been speaking with the hematologist closely by secure chat and platelets down to 8 after receiving 1 unit of platelets on 04/30/2021.  Ordered additional 2 units platelets and 2 units PRBC for acute blood loss anemia.  Transfusing supportively per hematology recommendations.  Because we have no inperson hematology coverage at [facility] decision made to transfer to [facility] where she can be seen by Dr. Alvy Bimler who saw her yesterday.  I have communicated transfer of care plans with Dr. Alvy Bimler and with [facility] attending at [facility].   Acute blood loss anemia - discussed with Dr. Constance Haw, we got a stat CT abd/pelv with findings of a small pelvic hematoma (250 cc or less) - per Dr. Constance Haw does not require taking back to OR at this time.   POD #2 s/p stomy reversal - Pt remains on clear liquid diet, management per surgical team. Dr Constance Haw has signed over care to [facility] for consultation at [facility].    Essential hypertension - stable, well controlled on home atenolol 50 mg daily.   History of epilepsy - no recent seizure activity, remains stable on gabapentin.   Tobacco - she has been counseled on cessation, did not give nicotine patch in setting of severe thrombocytopenia  Postop urinary retention - foley catheter placed 05/01/21.    DVT prophylaxis: SCDs Code Status: Full  Family Communication: plan of care discussed with patient at bedside, verbalized understanding Disposition: TBD Status is: Inpatient  Remains inpatient appropriate because:IV treatments appropriate due to intensity of illness or inability to take PO and Inpatient level of care appropriate due to severity of illness  Dispo: The patient is from: Home              Anticipated d/c is to:  TBD              Patient currently is not medically stable to d/c.   Difficult to place patient No   Consultants:  Hematology / oncology   Procedures:  Colostomy reversal 04/30/21   Antimicrobials:  N/a   Subjective: Pt is reporting abdominal pain, bladder  distention and difficulty urinating.  Objective: Vitals:   05/01/21 0952 05/01/21 0953 05/01/21 1000 05/01/21 1006  BP: 138/62  (!) 135/52   Pulse: 89 90 86 (!) 3  Resp: (!) 24 (!) 24 13 14   Temp: 97.9 F (36.6 C)   97.9 F (36.6 C)  TempSrc: Axillary   Axillary  SpO2: 95% 94% 96% 97%  Weight:      Height:        Intake/Output Summary (Last 24 hours) at 05/01/2021 1131 Last data filed at 05/01/2021 1056 Gross per 24 hour  Intake 1417.87 ml  Output 600 ml  Net 817.87 ml   Filed Weights   04/30/21 0423 04/30/21 1442 05/01/21 0500  Weight: 83.6 kg 79.2 kg 81 kg    Examination:  General exam: Appears calm and comfortable  Respiratory system: Clear to auscultation. Respiratory effort normal. Cardiovascular system: normal S1 & S2 heard. No JVD, murmurs, rubs, gallops or clicks. No pedal edema. Gastrointestinal system: Abdomen is nondistended, soft and nontender. No organomegaly or masses felt. Normal bowel sounds heard. Central nervous system: Alert and oriented. No focal neurological deficits. Extremities: Symmetric 5 x 5 power. Skin: No rashes, lesions or ulcers Psychiatry: Judgement and insight appear normal. Mood & affect appropriate.   Data Reviewed: I have personally reviewed following labs and imaging studies  CBC: Recent Labs  Lab 04/25/21 1344 04/29/21 0532 04/30/21 0627 04/30/21 0922 05/01/21 0541  WBC 10.1 16.2* 13.2* 14.2* 10.1  NEUTROABS 6.0 11.6* 8.8* 9.7* 6.2  HGB 15.4* 12.1 9.7* 9.2* 7.6*  HCT 49.0* 37.7 30.8* 28.2* 23.3*  MCV 86.7 84.5 86.0 84.9 85.0  PLT 277 91* 10* 10*  10* 8*    Basic Metabolic Panel: Recent Labs  Lab 04/25/21 1344 04/29/21 0532 04/30/21 0440 04/30/21 0922 05/01/21 0541  NA 134* 134* 134* 134* 135  K 3.8 3.9 3.5 3.7 3.4*  CL 99 99 96* 96* 98  CO2 25 28 28  32 32  GLUCOSE 84 138* 116* 128* 98  BUN 10 9 8 9 8   CREATININE 0.65 0.61 0.64 0.63 0.51  CALCIUM 9.3 8.4* 8.2* 8.2* 7.6*  MG  --  1.9 2.1  --   --   PHOS  --   2.7 1.8*  --   --     GFR: Estimated Creatinine Clearance: 72.9 mL/min (by C-G formula based on SCr of 0.51 mg/dL).  Liver Function Tests: Recent Labs  Lab 04/30/21 0922 05/01/21 0541  AST 20 20  ALT 25 20  ALKPHOS 78 66  BILITOT 0.5 0.7  PROT 5.7* 5.3*  ALBUMIN 2.8* 2.5*    CBG: Recent Labs  Lab 04/29/21 1112  GLUCAP 134*    Recent Results (from the past 240 hour(s))  SARS CORONAVIRUS 2 (TAT 6-24 HRS) Nasopharyngeal Nasopharyngeal Swab     Status: None   Collection Time: 04/25/21  1:44  PM   Specimen: Nasopharyngeal Swab  Result Value Ref Range Status   SARS Coronavirus 2 NEGATIVE NEGATIVE Final    Comment: (NOTE) SARS-CoV-2 target nucleic acids are NOT DETECTED.  The SARS-CoV-2 RNA is generally detectable in upper and lower respiratory specimens during the acute phase of infection. Negative results do not preclude SARS-CoV-2 infection, do not rule out co-infections with other pathogens, and should not be used as the sole basis for treatment or other patient management decisions. Negative results must be combined with clinical observations, patient history, and epidemiological information. The expected result is Negative.  Fact Sheet for Patients: SugarRoll.be  Fact Sheet for Healthcare Providers: https://www.woods-mathews.com/  This test is not yet approved or cleared by the [place] FDA and  has been authorized for detection and/or diagnosis of SARS-CoV-2 by FDA under an Emergency Use Authorization (EUA). This EUA will remain  in effect (meaning this test can be used) for the duration of the COVID-19 declaration under Se ction 564(b)(1) of the Act, 21 U.S.C. section 360bbb-3(b)(1), unless the authorization is terminated or revoked sooner.  Performed at [facility], [place] [address] [place], [place] [address]   MRSA Next Gen by PCR, Nasal     Status: None   Collection Time: 04/30/21  2:44 PM   Specimen:  Nasal Mucosa; Nasal Swab  Result Value Ref Range Status   MRSA by PCR Next Gen NOT DETECTED NOT DETECTED Final    Comment: (NOTE) The GeneXpert MRSA Assay (FDA approved for NASAL specimens only), is one component of a comprehensive MRSA colonization surveillance program. It is not intended to diagnose MRSA infection nor to guide or monitor treatment for MRSA infections. Test performance is not FDA approved in patients less than 23 years old. Performed at [facility], [address], [place], [place] [address]     Radiology Studies: No results found.  Scheduled Meds:  sodium chloride   Intravenous Once   sodium chloride   Intravenous Once   acetaminophen  1,000 mg Oral Q6H   amitriptyline  50 mg Oral QHS   atenolol  50 mg Oral Daily   busPIRone  15 mg Oral TID   Chlorhexidine Gluconate Cloth  6 each Topical Daily   citalopram  40 mg Oral Q breakfast   feeding supplement  237 mL Oral BID BM   gabapentin  300 mg Oral BID   latanoprost  1 drop Both Eyes QHS   loratadine  10 mg Oral Daily   pantoprazole  40 mg Oral Daily   saccharomyces boulardii  250 mg Oral BID   sodium chloride flush  10-40 mL Intracatheter Q12H   traZODone  150 mg Oral QHS   Continuous Infusions:  lactated ringers 50 mL/hr at 04/30/21 2238    LOS: 3 days   Time spent: 35 mins   Juwana Thoreson Wynetta Emery, MD How to contact the [facility] Attending or Consulting provider [place] or covering provider during after hours [place], for this patient?  Check the care team in [facility] and look for a) attending/consulting [facility] provider listed and b) the [facility] team listed Log into [email or web address] and use [place]'s universal password to access. If you do not have the password, please contact the hospital operator. Locate the [facility] provider you are looking for under [facility] and page to a number that you can be directly reached. If you still have difficulty reaching the provider, please page the [facility] (Director on Call) for the  Hospitalists listed on  amion for assistance.  05/01/2021, 11:31 AM

## 2021-05-01 NOTE — Progress Notes (Signed)
Referral made to Chaplain from Nursing regarding completion of Advanced Care Directive. The paperwork that patient had was a financial power of attorney and not a HCPOA/ Chaplain introduced self and provided information and engaged in conversation with patient and her twin sister about decisions at EOL. She filled out both HCPOA/Living Will and elected her sister as HCPOA. Due to lack of availability of notary public in house today, patient will need to have these documents completed once transferred to sister hospital [facility] in [place]. This chaplain has connected with internal spiritual care department for continuity of care. Chaplain provided space for patient to share her fears and concerns related to her upcoming surgery and provided prayer bedside. Chaplain will remain available in order to provide spiritual support and to assess for spiritual need.

## 2021-05-01 NOTE — Progress Notes (Signed)
Pt arrived via [facility] from [facility]. VSS. Voicemail left for Dr. Alvy Bimler per away message on live chat. No PHI left on message. On call [facility] surgeon notified via [place] pager of pt's arrival. Dr. Sarajane Jews notified of pt arrival via live chat.

## 2021-05-01 NOTE — Plan of Care (Signed)
Plan of care reviewed and discussed with the patient. 

## 2021-05-01 NOTE — Progress Notes (Signed)
[facility] Progress Note  3 Days Post-Op  Subjective: No signs of bleeding from rectum or with Bms, no intraperitoneal large volume blood loss on CT. Has as history in the 1970s of ITP? That resolved as a child and did not require a splenectomy. She has had multiple surgeries since that time (10+) and has not had issues with bleeding, she has been exposed to heparin products before.   Vitals all reassuring.  Updated her and her sister at bedside.   Felt lower belly pain this AM and foley placed for retention. Could have been feeling the pressure from hematoma in the pelvis.   Objective: Vital signs in last 24 hours: Temp:  [97.5 F (36.4 C)-98.9 F (37.2 C)] 97.9 F (36.6 C) (06/23 1200) Pulse Rate:  [3-94] 88 (06/23 1200) Resp:  [12-24] 20 (06/23 1200) BP: (94-176)/(45-102) 170/84 (06/23 1200) SpO2:  [86 %-100 %] 97 % (06/23 1200) Weight:  [79.2 kg-81 kg] 81 kg (06/23 0500) Last BM Date: 05/01/21  Intake/Output from previous day: 06/22 0701 - 06/23 0700 In: 1342.9 [P.O.:320; I.V.:728.5; Blood:294.3] Out: -  Intake/Output this shift: Total I/O In: 1000 [P.O.:400; Blood:600] Out: 600 [Urine:600]  General appearance: alert, cooperative, and no distress Resp: normal work of breathing GI: soft, distended, appropriately tender, dressing without staining or bleeding Extremities: extremities normal, atraumatic, no cyanosis or edema  Lab Results:  Recent Labs    04/30/21 0922 05/01/21 0541  WBC 14.2* 10.1  HGB 9.2* 7.6*  HCT 28.2* 23.3*  PLT 10*  10* 8*   BMET Recent Labs    04/30/21 0922 05/01/21 0541  NA 134* 135  K 3.7 3.4*  CL 96* 98  CO2 32 32  GLUCOSE 128* 98  BUN 9 8  CREATININE 0.63 0.51  CALCIUM 8.2* 7.6*   PT/INR Recent Labs    04/30/21 0922  LABPROT 15.0  INR 1.2    Studies/Results: CT ABDOMEN PELVIS WO CONTRAST  Result Date: 05/01/2021 CLINICAL DATA:  Post colostomy reversal, postoperative anemia, fall in hemoglobin from  12-7 g EXAM: CT ABDOMEN AND PELVIS WITHOUT CONTRAST TECHNIQUE: Multidetector CT imaging of the abdomen and pelvis was performed following the standard protocol without IV contrast. Sagittal and coronal MPR images reconstructed from axial data set. No oral contrast administered. COMPARISON:  01/15/2021 FINDINGS: Lower chest: Bibasilar atelectasis Hepatobiliary: Gallbladder surgically absent.  Liver unremarkable. Pancreas: Atrophic, otherwise normal appearance Spleen: Normal appearance Adrenals/Urinary Tract: Adrenal glands, kidneys, and ureters normal appearance. Bladder decompressed by Foley catheter. Stomach/Bowel: Post colostomy reversal with anastomotic staple line in sigmoid colon. Prominent small bowel loops consistent with postoperative ileus. Normal appendix. Stomach unremarkable. Vascular/Lymphatic: Minimal atherosclerotic calcification aorta. Aorta normal caliber. No adenopathy. Reproductive: Uterus surgically absent with nonvisualization of ovaries Other: Scattered free air consistent with recent surgery. Small fluid collection at the colostomy takedown site, low-attenuation, 7.4 x 2.7 x 4.5 cm image 58. Minimal free fluid. High attenuation collection identified within the pelvis, 11.1 x 4.8 x 8.2 cm consistent with hematoma. No hernia. Musculoskeletal: Prior lumbar fusion L4-S1. Osseous structures otherwise unremarkable. IMPRESSION: Post colostomy reversal with anastomotic staple line in sigmoid colon. Prominent small bowel loops consistent with postoperative ileus. High attenuation collection within the pelvis 11.1 x 4.8 x 8.2 cm consistent with hematoma. Small fluid collection at the colostomy takedown site 7.4 x 2.7 cm, could represent a sterile or infected postoperative collection. Bibasilar atelectasis. Aortic Atherosclerosis (ICD10-I70.0). Electronically Signed   By: Lavonia Dana M.D.   On: 05/01/2021 11:44  Anti-infectives: Anti-infectives (From admission, onward)    Start     Dose/Rate Route  Frequency Ordered Stop   04/29/21 1000  cefoTEtan (CEFOTAN) 1 g in sodium chloride 0.9 % 100 mL IVPB        1 g 200 mL/hr over 30 Minutes Intravenous Every 30 min 04/29/21 0855 04/29/21 1138   04/28/21 2000  cefoTEtan (CEFOTAN) 2 g in sodium chloride 0.9 % 100 mL IVPB  Status:  Discontinued        2 g 200 mL/hr over 30 Minutes Intravenous Every 12 hours 04/28/21 1608 04/29/21 0855   04/28/21 0630  cefoTEtan (CEFOTAN) 2 g in sodium chloride 0.9 % 100 mL IVPB        2 g 200 mL/hr over 30 Minutes Intravenous On call to O.R. 04/28/21 0624 04/28/21 0745       Assessment/Plan: [name] is a 60 yo s/p colostomy reversal with post operative anemia and thrombocytopenia. Getting 2 u PRBC and 2 pack platelets this AM. Plan for repeat labs after transfusion.  PRN for pain, chronic pain IS, OOB with assistance, fall precaution due to thrombocytopenia and risk of bleeding Clear diet for now, having multiple brown Bms, can adv as tolerated / dilated SB on CT but clinically no ileus  Foley for some retention and lower abdomen pain, can likely d/c in the next 24-48 hrs H&H down to 7.6/ 23.3 and platelets to 8, getting transfused now, hematology saw yesterday and will transfer down so they can follow more closely given no coverage at [facility] All medications that can cause thrombocytopenia were stopped  CT a/p done to rule out large intraperitoneal or retroperitoneal bleed- volume of blood in pelvis by Dr. Thornton Papas measurements 180-250cc max, anastomosis patent  SCDs, holding anticoagulation   Appreciate everyone's assistance. [facility] Aware of patient.   LOS: 3 days    [name] 05/01/2021

## 2021-05-01 NOTE — Progress Notes (Signed)
[facility]  Will have completed 2 u PRBC and 3 platelets and labs repeat at 1600. Still awaiting a bed.  BP 137/71   Pulse 82   Temp (!) 97.3 F (36.3 C) (Axillary)   Resp 13   Ht 5\' 2"  (1.575 m)   Wt 81 kg   SpO2 91%   BMI 32.66 kg/m   [name], MD [facility] [address] [place], [place] [address] [phone] (office)

## 2021-05-01 NOTE — Progress Notes (Signed)
[facility] departing [facility] 06 at 1734 en route to [facility] on 4E for room 1412-01. Vital signs stable, room air, SR on the monitor. Current labs reviewed with transport team. Report given to Jinny Blossom, RN with information on orders to notify provider Alvy Bimler upon patient arrival to her unit. Patient's sister has all belongings with the exception of one cell phone and charging cable. Provider Constance Haw and [place] notified of departure.

## 2021-05-01 NOTE — Progress Notes (Signed)
[facility]  Updated patient about plan for transfer given that H&H and platelets down and no large bleeding intraperitoneal and no bloody Bms.  This looks like it is hematologic issues and she needs closer follow up with Hematology.  She is understanding. Her sister Coralyn Mark is at the bedside to. They know that she will be transferred to [facility] and that [place] will help with her post operative course and I Have spoken with that time.  [name], MD [facility] [address] [place], [place] [address] [phone] (office)

## 2021-05-02 LAB — PREPARE PLATELET PHERESIS
Unit division: 0
Unit division: 0
Unit division: 0

## 2021-05-02 LAB — BPAM PLATELET PHERESIS
Blood Product Expiration Date: 202206232359
Blood Product Expiration Date: 202206242359
Blood Product Expiration Date: 202206252359
ISSUE DATE / TIME: 202206221521
ISSUE DATE / TIME: 202206230751
ISSUE DATE / TIME: 202206231414
Unit Type and Rh: 5100
Unit Type and Rh: 6200
Unit Type and Rh: 7300

## 2021-05-02 LAB — COMPREHENSIVE METABOLIC PANEL
ALT: 17 U/L (ref 0–44)
AST: 16 U/L (ref 15–41)
Albumin: 2.7 g/dL — ABNORMAL LOW (ref 3.5–5.0)
Alkaline Phosphatase: 70 U/L (ref 38–126)
Anion gap: 6 (ref 5–15)
BUN: 8 mg/dL (ref 8–23)
CO2: 32 mmol/L (ref 22–32)
Calcium: 8.4 mg/dL — ABNORMAL LOW (ref 8.9–10.3)
Chloride: 102 mmol/L (ref 98–111)
Creatinine, Ser: 0.55 mg/dL (ref 0.44–1.00)
GFR, Estimated: >60 mL/min (ref >60)
Glucose, Bld: 93 mg/dL (ref 70–99)
Potassium: 3.2 mmol/L — ABNORMAL LOW (ref 3.5–5.1)
Sodium: 140 mmol/L (ref 135–145)
Total Bilirubin: 0.7 mg/dL (ref 0.3–1.2)
Total Protein: 5.5 g/dL — ABNORMAL LOW (ref 6.5–8.1)

## 2021-05-02 LAB — HEPARIN INDUCED PLATELET AB (HIT ANTIBODY): Heparin Induced Plt Ab: 0.212 OD (ref 0.000–0.400)

## 2021-05-02 LAB — BPAM RBC
Blood Product Expiration Date: 202206282359
Blood Product Expiration Date: 202206292359
ISSUE DATE / TIME: 202206230945
ISSUE DATE / TIME: 202206231138
Unit Type and Rh: 9500
Unit Type and Rh: 9500

## 2021-05-02 LAB — TYPE AND SCREEN
ABO/RH(D): O NEG
Antibody Screen: NEGATIVE
Unit division: 0
Unit division: 0

## 2021-05-02 LAB — CBC
HCT: 30.4 % — ABNORMAL LOW (ref 36.0–46.0)
Hemoglobin: 10.6 g/dL — ABNORMAL LOW (ref 12.0–15.0)
MCH: 28.8 pg (ref 26.0–34.0)
MCHC: 34.9 g/dL (ref 30.0–36.0)
MCV: 82.6 fL (ref 80.0–100.0)
Platelets: 55 10*3/uL — ABNORMAL LOW (ref 150–400)
RBC: 3.68 MIL/uL — ABNORMAL LOW (ref 3.87–5.11)
RDW: 14.8 % (ref 11.5–15.5)
WBC: 11.1 10*3/uL — ABNORMAL HIGH (ref 4.0–10.5)
nRBC: 0.5 % — ABNORMAL HIGH (ref 0.0–0.2)

## 2021-05-02 MED ORDER — POTASSIUM CHLORIDE 10 MEQ/100ML IV SOLN
10.0000 meq | INTRAVENOUS | Status: AC
  Administered 2021-05-02 (×6): 10 meq via INTRAVENOUS
  Filled 2021-05-02 (×2): qty 100

## 2021-05-02 MED ORDER — AMITRIPTYLINE HCL 25 MG PO TABS
100.0000 mg | ORAL_TABLET | Freq: Two times a day (BID) | ORAL | Status: DC
  Administered 2021-05-02 – 2021-05-04 (×5): 100 mg via ORAL
  Filled 2021-05-02 (×5): qty 4

## 2021-05-02 MED ORDER — OXYCODONE HCL 5 MG PO TABS
10.0000 mg | ORAL_TABLET | Freq: Four times a day (QID) | ORAL | Status: DC
  Administered 2021-05-02 – 2021-05-05 (×12): 10 mg via ORAL
  Filled 2021-05-02 (×12): qty 2

## 2021-05-02 MED ORDER — MORPHINE SULFATE (PF) 4 MG/ML IV SOLN
4.0000 mg | INTRAVENOUS | Status: DC | PRN
Start: 2021-05-02 — End: 2021-05-05
  Administered 2021-05-02 – 2021-05-04 (×5): 4 mg via INTRAVENOUS
  Filled 2021-05-02 (×5): qty 1

## 2021-05-02 NOTE — Consult Note (Signed)
Reason for Consult:colon reversal Referring Provider: Darliss Cheney  [name] is an 60 y.o. female.  HPI: 60 yo female had colectomy for perforated stercoral ulcer. She underwent reversal on 04/18/2021. During her postoperative course she developed thrombocytopenia requiring transfer. Today she continues to have pain issues. She has nausea but no vomiting. She notes bowel movements and flatus yesterday.  Past Medical History:  Diagnosis Date   Abdominal pain    Allergy    seasonal   Anxiety    Arthritis    Blood in stool    Breast changes, fibrocystic    Bruises easily    Cardiomegaly 2018   Clotting disorder (HCC)     itp , none since 1976, no current hematologist   Constipation    DDD (degenerative disc disease), cervical    with lumbar issues   Depression    Generalized headaches    GERD (gastroesophageal reflux disease)    Glaucoma    Hemorrhoid    Hyperlipidemia    Hypertension    Idiopathic thrombocytopenia (HCC)    as child   Leg swelling    both ankles   Liver lesion    Nasal congestion    Nausea & vomiting    Neuromuscular disorder (HCC)    back  injury   Neuromuscular disorder ([place])    3 neck surgeries,neck fusion.pin,plates,screws   Osteoporosis    Panic attacks    pt also  has germaphobia   Rectal bleeding    Seizures ([place]) 1998   stress induced, one time, none since, no seizure meds   Seizures ([place])    Stated she had 2 weeks ago,it was like a transient,stare.04/01/21 pt. denies   Spleen enlarged 1976, none since   r/t idiopathic thrombocytopenia   Trouble swallowing    Unintentional weight loss    Weakness    weakness varies    Past Surgical History:  Procedure Laterality Date   ABDOMINAL HYSTERECTOMY  1997   BACK SURGERY     CERVICAL SPINE SURGERY  2005, 2007, 2010   x3, fusion done,plates and screws present   [place]  03/14/2012   Procedure: LAPAROSCOPIC CHOLECYSTECTOMY;  Surgeon: Stark Klein, MD;  Location: [facility];  Service: General;  Laterality: N/A;   COLECTOMY WITH COLOSTOMY CREATION/HARTMANN PROCEDURE N/A 12/21/2020   Procedure: COLECTOMY WITH COLOSTOMY CREATION/HARTMANN PROCEDURE;  Surgeon: Virl Cagey, MD;  Location: [facility];  Service: General;  Laterality: N/A;   colonscopy and esophagogastrodudononescopy  02-04-12   COLOSTOMY REVERSAL N/A 04/28/2021   Procedure: COLOSTOMY REVERSAL;  Surgeon: Virl Cagey, MD;  Location: [facility];  Service: General;  Laterality: N/A;   IR RADIOLOGIST EVAL & MGMT  01/15/2021   LUMBAR [place] SURGERY  11/2014    Family History  Problem Relation Age of Onset   Hypertension Mother    Heart disease Mother    Lung cancer Father    Cancer Father        lung   Colon cancer Paternal Grandmother    Cancer Paternal Grandmother        colon   Heart disease Maternal Grandmother    Diabetes Sister    Bipolar disorder Sister    Heart disease Sister        Congestive Heart Failure   Hypertension Sister    Esophageal cancer Neg Hx    Rectal cancer Neg Hx    Stomach cancer Neg Hx     Social  History:  reports that she has been smoking cigarettes. She has a 12.00 pack-year smoking history. She has never used smokeless tobacco. She reports current drug use. Drug: Marijuana. She reports that she does not drink alcohol.  Allergies:  Allergies  Allergen Reactions   Nsaids Nausea Only    Abdominal pain    Medications: I have reviewed the patient's current medications.  Results for orders placed or performed during the hospital encounter of 04/28/21 (from the past 48 hour(s))  CBC with Differential/Platelet     Status: Abnormal   Collection Time: 04/30/21  9:22 AM  Result Value Ref Range   WBC 14.2 (H) 4.0 - 10.5 K/uL   RBC 3.32 (L) 3.87 - 5.11 MIL/uL   Hemoglobin 9.2 (L) 12.0 - 15.0 g/dL   HCT 28.2 (L) 36.0 - 46.0 %   MCV 84.9 80.0 - 100.0 fL   MCH 27.7 26.0 - 34.0 pg   MCHC 32.6 30.0 - 36.0 g/dL   RDW 15.5 11.5 - 15.5 %    Platelets 10 (LL) 150 - 400 K/uL    Comment: SPECIMEN CHECKED FOR CLOTS Immature Platelet Fraction may be clinically indicated, consider ordering this additional test [id number] PLATELET COUNT CONFIRMED BY SMEAR THIS CRITICAL RESULT HAS VERIFIED AND BEEN CALLED TO FOWLER,A BY BOBBIE MATTHEWS ON 06 22 2022 AT 0959, AND HAS BEEN READ BACK.     nRBC 0.0 0.0 - 0.2 %   Neutrophils Relative % 68 %   Neutro Abs 9.7 (H) 1.7 - 7.7 K/uL   Lymphocytes Relative 20 %   Lymphs Abs 2.9 0.7 - 4.0 K/uL   Monocytes Relative 9 %   Monocytes Absolute 1.2 (H) 0.1 - 1.0 K/uL   Eosinophils Relative 2 %   Eosinophils Absolute 0.3 0.0 - 0.5 K/uL   Basophils Relative 0 %   Basophils Absolute 0.1 0.0 - 0.1 K/uL   WBC Morphology TOXIC GRANULATION    Immature Granulocytes 1 %   Abs Immature Granulocytes 0.13 (H) 0.00 - 0.07 K/uL    Comment: Performed at [facility], [address], [place], [place] [address]  Comprehensive metabolic panel     Status: Abnormal   Collection Time: 04/30/21  9:22 AM  Result Value Ref Range   Sodium 134 (L) 135 - 145 mmol/L   Potassium 3.7 3.5 - 5.1 mmol/L   Chloride 96 (L) 98 - 111 mmol/L   CO2 32 22 - 32 mmol/L   Glucose, Bld 128 (H) 70 - 99 mg/dL    Comment: Glucose reference range applies only to samples taken after fasting for at least 8 hours.   BUN 9 8 - 23 mg/dL   Creatinine, Ser 0.63 0.44 - 1.00 mg/dL   Calcium 8.2 (L) 8.9 - 10.3 mg/dL   Total Protein 5.7 (L) 6.5 - 8.1 g/dL   Albumin 2.8 (L) 3.5 - 5.0 g/dL   AST 20 15 - 41 U/L   ALT 25 0 - 44 U/L   Alkaline Phosphatase 78 38 - 126 U/L   Total Bilirubin 0.5 0.3 - 1.2 mg/dL   GFR, Estimated >60 >60 mL/min    Comment: (NOTE) Calculated using the CKD-EPI Creatinine Equation (2021)    Anion gap 6 5 - 15    Comment: Performed at [facility], [address], [place], [place] [address]  DIC Panel ONCE - STAT     Status: Abnormal   Collection Time: 04/30/21  9:22 AM  Result Value Ref Range   Prothrombin Time 15.0 11.4  -  15.2 seconds   INR 1.2 0.8 - 1.2    Comment: (NOTE) INR goal varies based on device and disease states.    aPTT 29 24 - 36 seconds   Fibrinogen 553 (H) 210 - 475 mg/dL   D-Dimer, Quant 2.30 (H) 0.00 - 0.50 ug/mL-FEU    Comment: (NOTE) At the manufacturer cut-off value of 0.5 g/mL FEU, this assay has a negative predictive value of 95-100%.This assay is intended for use in conjunction with a clinical pretest probability (PTP) assessment model to exclude pulmonary embolism (PE) and deep venous thrombosis (DVT) in outpatients suspected of PE or DVT. Results should be correlated with clinical presentation.    Platelets 10 (LL) 150 - 400 K/uL    Comment: SPECIMEN CHECKED FOR CLOTS Immature Platelet Fraction may be clinically indicated, consider ordering this additional test [id number] PLATELET COUNT CONFIRMED BY SMEAR CRITICAL RESULT CALLED TO, READ BACK BY AND VERIFIED WITH: FOWLER,A@1000  BY MATTHEWS,B 6.22.22    Smear Review NO SCHISTOCYTES SEEN     Comment: Performed at [facility], [address] [place], [place] [address]  Save Smear     Status: None   Collection Time: 04/30/21  9:22 AM  Result Value Ref Range   Smear Review NO SCHISTOCYTES SEEN     Comment: Performed at [facility], [address], [place], [place] [address]  Type and screen [facility]     Status: None (Preliminary result)   Collection Time: 04/30/21  1:31 PM  Result Value Ref Range   ABO/RH(D) O NEG    Antibody Screen NEG    Sample Expiration 05/03/2021,2359    Unit Number [id number]    Blood Component Type RBC, LR IRR    Unit division 00    Status of Unit ISSUED    Transfusion Status OK TO TRANSFUSE    Crossmatch Result      Compatible Performed at [facility], [address] [place], [place] [address]    Unit Number [id number]    Blood Component Type RBC, LR IRR    Unit division 00    Status of Unit ISSUED    Transfusion Status OK TO TRANSFUSE    Crossmatch Result Compatible    Prepare Pheresed Platelets     Status: None (Preliminary result)   Collection Time: 04/30/21  1:31 PM  Result Value Ref Range   Unit Number [id number]    Blood Component Type PLTP1 PSORALEN TREATED    Unit division 00    Status of Unit ISSUED    Transfusion Status OK TO TRANSFUSE    Unit Number [id number]    Blood Component Type PLTP1 PSORALEN TREATED    Unit division 00    Status of Unit ISSUED,FINAL    Transfusion Status      OK TO TRANSFUSE Performed at [facility], [address], [place], [place] [address]   Prepare fresh frozen plasma     Status: None (Preliminary result)   Collection Time: 04/30/21  1:31 PM  Result Value Ref Range   Unit Number [id number]    Blood Component Type THW PLS APHR    Unit division 00    Status of Unit ALLOCATED    Transfusion Status      OK TO TRANSFUSE Performed at [facility], [address] [place], [place] [address]    Unit Number [id number]    Blood Component Type THW PLS APHR    Unit division 00    Status of Unit ALLOCATED  Transfusion Status OK TO TRANSFUSE   Prepare RBC (crossmatch)     Status: None   Collection Time: 04/30/21  1:31 PM  Result Value Ref Range   Order Confirmation      ORDER PROCESSED BY BLOOD BANK Performed at [facility], [address] [place], [place] [address]   Prepare Pheresed Platelets     Status: None (Preliminary result)   Collection Time: 04/30/21  1:31 PM  Result Value Ref Range   Unit Number [id number]    Blood Component Type PLTPHER LR2    Unit division 00    Status of Unit ISSUED    Transfusion Status      OK TO TRANSFUSE Performed at [address] [place], [place] [address]   MRSA Next Gen by PCR, Nasal     Status: None   Collection Time: 04/30/21  2:44 PM   Specimen: Nasal Mucosa; Nasal Swab  Result Value Ref Range   MRSA by PCR Next Gen NOT DETECTED NOT DETECTED    Comment: (NOTE) The GeneXpert MRSA Assay (FDA approved for NASAL specimens only), is  one component of a comprehensive MRSA colonization surveillance program. It is not intended to diagnose MRSA infection nor to guide or monitor treatment for MRSA infections. Test performance is not FDA approved in patients less than 41 years old. Performed at [facility], [address], [place], [place] [address]   CBC with Differential/Platelet     Status: Abnormal   Collection Time: 05/01/21  5:41 AM  Result Value Ref Range   WBC 10.1 4.0 - 10.5 K/uL   RBC 2.74 (L) 3.87 - 5.11 MIL/uL   Hemoglobin 7.6 (L) 12.0 - 15.0 g/dL   HCT 23.3 (L) 36.0 - 46.0 %   MCV 85.0 80.0 - 100.0 fL   MCH 27.7 26.0 - 34.0 pg   MCHC 32.6 30.0 - 36.0 g/dL   RDW 15.5 11.5 - 15.5 %   Platelets 8 (LL) 150 - 400 K/uL    Comment: SPECIMEN CHECKED FOR CLOTS Immature Platelet Fraction may be clinically indicated, consider ordering this additional test [id number] PLATELET COUNT CONFIRMED BY SMEAR THIS CRITICAL RESULT HAS VERIFIED AND BEEN CALLED TO [place],L BY BOBBIE MATTHEWS ON 06 23 2022 AT 0707, AND HAS BEEN READ BACK.     nRBC 0.0 0.0 - 0.2 %   Neutrophils Relative % 62 %   Neutro Abs 6.2 1.7 - 7.7 K/uL   Lymphocytes Relative 22 %   Lymphs Abs 2.2 0.7 - 4.0 K/uL   Monocytes Relative 9 %   Monocytes Absolute 0.9 0.1 - 1.0 K/uL   Eosinophils Relative 6 %   Eosinophils Absolute 0.7 (H) 0.0 - 0.5 K/uL   Basophils Relative 0 %   Basophils Absolute 0.0 0.0 - 0.1 K/uL   Immature Granulocytes 1 %   Abs Immature Granulocytes 0.10 (H) 0.00 - 0.07 K/uL    Comment: Performed at [facility], [address], [place], [place] [address]  Comprehensive metabolic panel     Status: Abnormal   Collection Time: 05/01/21  5:41 AM  Result Value Ref Range   Sodium 135 135 - 145 mmol/L   Potassium 3.4 (L) 3.5 - 5.1 mmol/L   Ch[place]ide 98 98 - 111 mmol/L   CO2 32 22 - 32 mmol/L   Glucose, Bld 98 70 - 99 mg/dL    Comment: Glucose reference range applies only to samples taken after fasting for at least 8 hours.   BUN 8 8 -  23  mg/dL   Creatinine, Ser 0.51 0.44 - 1.00 mg/dL   Calcium 7.6 (L) 8.9 - 10.3 mg/dL   Total Protein 5.3 (L) 6.5 - 8.1 g/dL   Albumin 2.5 (L) 3.5 - 5.0 g/dL   AST 20 15 - 41 U/L   ALT 20 0 - 44 U/L   Alkaline Phosphatase 66 38 - 126 U/L   Total Bilirubin 0.7 0.3 - 1.2 mg/dL   GFR, Estimated >60 >60 mL/min    Comment: (NOTE) Calculated using the CKD-EPI Creatinine Equation (2021)    Anion gap 5 5 - 15    Comment: Performed at [facility], [address], [place], [place] [address]  CBC with Differential/Platelet     Status: Abnormal   Collection Time: 05/01/21  4:12 PM  Result Value Ref Range   WBC 14.9 (H) 4.0 - 10.5 K/uL   RBC 4.00 3.87 - 5.11 MIL/uL   Hemoglobin 11.4 (L) 12.0 - 15.0 g/dL    Comment: REPEATED TO VERIFY POST TRANSFUSION SPECIMEN DELTA CHECK NOTED    HCT 33.7 (L) 36.0 - 46.0 %   MCV 84.3 80.0 - 100.0 fL   MCH 28.5 26.0 - 34.0 pg   MCHC 33.8 30.0 - 36.0 g/dL   RDW 14.5 11.5 - 15.5 %   Platelets 41 (L) 150 - 400 K/uL    Comment: SPECIMEN CHECKED FOR CLOTS Immature Platelet Fraction may be clinically indicated, consider ordering this additional test [id number] REPEATED TO VERIFY PLATELET COUNT CONFIRMED BY SMEAR    nRBC 0.1 0.0 - 0.2 %   Neutrophils Relative % 64 %   Neutro Abs 9.5 (H) 1.7 - 7.7 K/uL   Lymphocytes Relative 18 %   Lymphs Abs 2.7 0.7 - 4.0 K/uL   Monocytes Relative 9 %   Monocytes Absolute 1.4 (H) 0.1 - 1.0 K/uL   Eosinophils Relative 4 %   Eosinophils Absolute 0.7 (H) 0.0 - 0.5 K/uL   Basophils Relative 1 %   Basophils Absolute 0.1 0.0 - 0.1 K/uL   Immature Granulocytes 4 %   Abs Immature Granulocytes 0.55 (H) 0.00 - 0.07 K/uL    Comment: Performed at [facility], [address], [place], [place] [address]  Protime-INR     Status: None   Collection Time: 05/01/21  4:12 PM  Result Value Ref Range   Prothrombin Time 13.0 11.4 - 15.2 seconds   INR 1.0 0.8 - 1.2    Comment: (NOTE) INR goal varies based on device and disease  states. Performed at [facility], [address] [place], [place] [address]   APTT     Status: Abnormal   Collection Time: 05/01/21  4:12 PM  Result Value Ref Range   aPTT 21 (L) 24 - 36 seconds    Comment: Performed at [facility], [address] [place], [place] [address]  Fibrinogen     Status: Abnormal   Collection Time: 05/01/21  4:12 PM  Result Value Ref Range   Fibrinogen 530 (H) 210 - 475 mg/dL    Comment: Performed at [facility], [address], [place], [place] [address]    CT ABDOMEN PELVIS WO CONTRAST  Result Date: 05/01/2021 CLINICAL DATA:  Post colostomy reversal, postoperative anemia, fall in hemoglobin from 12-7 g EXAM: CT ABDOMEN AND PELVIS WITHOUT CONTRAST TECHNIQUE: Multidetector CT imaging of the abdomen and pelvis was performed following the standard protocol without IV contrast. Sagittal and coronal MPR images reconstructed from axial data set. No oral contrast administered. COMPARISON:  01/15/2021 FINDINGS: Lower chest: Bibasilar atelectasis Hepatobiliary: Gallbladder surgically absent.  Liver unremarkable. Pancreas: Atrophic, otherwise normal appearance Spleen: Normal appearance Adrenals/Urinary Tract: Adrenal glands, kidneys, and ureters normal appearance. Bladder decompressed by Foley catheter. Stomach/Bowel: Post colostomy reversal with anastomotic staple line in sigmoid colon. Prominent small bowel loops consistent with postoperative ileus. Normal appendix. Stomach unremarkable. Vascular/Lymphatic: Minimal atherosclerotic calcification aorta. Aorta normal caliber. No adenopathy. Reproductive: Uterus surgically absent with nonvisualization of ovaries Other: Scattered free air consistent with recent surgery. Small fluid collection at the colostomy takedown site, low-attenuation, 7.4 x 2.7 x 4.5 cm image 58. Minimal free fluid. High attenuation collection identified within the pelvis, 11.1 x 4.8 x 8.2 cm consistent with hematoma. No hernia. Musculoskeletal: Prior lumbar  fusion L4-S1. Osseous structures otherwise unremarkable. IMPRESSION: Post colostomy reversal with anastomotic staple line in sigmoid colon. Prominent small bowel loops consistent with postoperative ileus. High attenuation collection within the pelvis 11.1 x 4.8 x 8.2 cm consistent with hematoma. Small fluid collection at the colostomy takedown site 7.4 x 2.7 cm, could represent a sterile or infected postoperative collection. Bibasilar atelectasis. Aortic Atherosclerosis (ICD10-I70.0). Electronically Signed   By: Lavonia Dana M.D.   On: 05/01/2021 11:44    Review of Systems  Constitutional:  Positive for malaise/fatigue.  Gastrointestinal:  Positive for abdominal pain, blood in stool and nausea.  Endo/Heme/Allergies:  Bruises/bleeds easily.  All other systems reviewed and are negative.  PE Blood pressure 139/78, pulse 76, temperature 97.7 F (36.5 C), resp. rate 19, height 5\' 2"  (1.575 m), weight 81.2 kg, SpO2 93 %. Constitutional: NAD; conversant; no deformities Eyes: Moist conjunctiva; no lid lag; anicteric; PERRL Neck: Trachea midline; no thyromegaly Lungs: Normal respiratory effort; no tactile fremitus CV: RRR; no palpable thrills; no pitting edema GI: Abd soft, slight distension; no palpable hepatosplenomegaly MSK: unable to assess gait; no clubbing/cyanosis Psychiatric: Appropriate affect; alert and oriented x3 Lymphatic: No palpable cervical or axillary lymphadenopathy Skin: No major subcutaneous nodules. Warm and dry   Assessment/Plan: 60 yo female s/p open colostomy reversal for stercoral ulcer in past. She had worsening thrombocytopenia with acute blood loss anemia after surgery with multiple bloody bowel movements. She responded appropriately to 2 units pRBC yesterday with Hemoglobin 11.4 this morning. Platelets have come up to 41 from 8 yesterday. -continue clear liquids -continue multimodal pain control, unable to give NSAIDS due to bleeding/platelet issue -goal to ambulate  today  Arta Bruce Janira Mandell 05/02/2021, 8:02 AM

## 2021-05-02 NOTE — Progress Notes (Signed)
Chaplain engaged in an initial visit with [name].  [name] was in pain and in need of rest at time of visit.  [name] is grateful to now be at [facility] as she detailed her surgery that happened at [facility].  Chaplain offered support and will follow-up.    [name] is interested in completing an Advanced Directive and a chaplain will work to complete that with her.     05/02/21 1200  Clinical Encounter Type  Visited With Patient  Visit Type Initial

## 2021-05-02 NOTE — TOC Initial Note (Signed)
Transition of Care [facility]) - Initial/Assessment Note    Patient Details  Name: [name] MRN: [id number] Date of Birth: January 13, 1961  Transition of Care [facility]) CM/SW Contact:    Dessa Phi, RN Phone Number: 05/02/2021, 12:19 PM  Clinical Narrative: d/c plan home. Gen sx following-colostomy reversal;Medical oncology following.                  Expected Discharge Plan: Home/Self Care Barriers to Discharge: Continued Medical Work up   Patient Goals and CMS Choice Patient states their goals for this hospitalization and ongoing recovery are:: go home CMS Medicare.gov Compare Post Acute Care list provided to:: Patient    Expected Discharge Plan and Services Expected Discharge Plan: Home/Self Care   Discharge Planning Services: CM Consult   Living arrangements for the past 2 months: Single Family Home                                      Prior Living Arrangements/Services Living arrangements for the past 2 months: Single Family Home Lives with:: Spouse Patient language and need for interpreter reviewed:: Yes Do you feel safe going back to the place where you live?: Yes      Need for Family Participation in Patient Care: No (Comment) Care giver support system in place?: Yes (comment)   Criminal Activity/Legal Involvement Pertinent to Current Situation/Hospitalization: No - Comment as needed  Activities of Daily Living Home Assistive Devices/Equipment: None ADL Screening (condition at time of admission) Patient's cognitive ability adequate to safely complete daily activities?: Yes Is the patient deaf or have difficulty hearing?: No Does the patient have difficulty seeing, even when wearing glasses/contacts?: No Does the patient have difficulty concentrating, remembering, or making decisions?: No Patient able to express need for assistance with ADLs?: Yes Does the patient have difficulty dressing or bathing?: No Independently performs ADLs?: Yes (appropriate for  developmental age) Does the patient have difficulty walking or climbing stairs?: No Weakness of Legs: None Weakness of Arms/Hands: None  Permission Sought/Granted Permission sought to share information with : Case Manager Permission granted to share information with : Yes, Verbal Permission Granted  Share Information with NAME: Case manager           Emotional Assessment Appearance:: Appears stated age Attitude/Demeanor/Rapport: Gracious Affect (typically observed): Accepting Orientation: : Oriented to Self, Oriented to Place, Oriented to  Time, Oriented to Situation Alcohol / Substance Use: Not Applicable Psych Involvement: No (comment)  Admission diagnosis:  Colostomy in place [facility]) [Z93.3] Patient Active Problem List   Diagnosis Date Noted   Parastomal hernia without obstruction or gangrene 04/18/2021   Colostomy in place [facility]) 01/09/2021   Abscess, intra-abdominal, postoperative 12/31/2020   E. coli sepsis -due to fecal peritonitis 12/23/2020   Aspiration pneumonia of both lower lobes due to gastric secretions ([place]) 12/22/2020   Stercoral ulcer of large intestine 12/21/2020   Colon perforation due to stercoral ulcer with E. coli fecal peritonitis    Under care of pain management specialist 05/30/2020   Benzodiazepine dependence ([place]) 08/08/2018   Controlled substance agreement broken 08/08/2018   Hyperlipemia 08/05/2017   Chest pain 06/30/2017   AKI (acute kidney injury) ([place]) 07/09/2016   Hypokalemia 07/09/2016   Obesity (BMI 30-39.9) 07/02/2016   Osteopenia 12/20/2013   Constipation 02/04/2012   Diverticulosis of colon (without mention of hemorrhage) 02/04/2012   GERD (gastroesophageal reflux disease) 02/04/2012   Dysphagia 02/04/2012  Unspecified gastritis and gastroduodenitis without mention of hemorrhage 02/04/2012   Cystic liver mass 01/26/2012   Cholecystitis chronic 01/26/2012   SYNCOPE 12/20/2007   GAD (generalized anxiety disorder) 01/25/2007    Depression 01/25/2007   Essential hypertension 01/25/2007   BREAST MASSES, BILATERAL 01/25/2007   DEGENERATIVE DISC DISEASE, CERVICAL SPINE 01/25/2007   LOW BACK PAIN 01/25/2007   Epilepsy ([place]) 01/25/2007   Insomnia 01/25/2007   DEPENDENT EDEMA, LEGS, BILATERAL 01/25/2007   Current smoker 01/25/2007   TAH/BSO, HX OF 01/25/2007   PCP:  [name], DO Pharmacy:   [id number] [place], [address] [place] [address] Phone: [phone] Fax: [phone]     Social Determinants of Health (SDOH) Interventions    Readmission Risk Interventions Readmission Risk Prevention Plan 01/02/2021  Transportation Screening Complete  HRI or Home Care Consult Complete  Social Work Consult for [place] Planning/Counseling Complete  Palliative Care Screening Not Applicable  Medication Review Press photographer) Complete  Some recent data might be hidden

## 2021-05-02 NOTE — Progress Notes (Addendum)
PROGRESS NOTE   [name]  FXT:[id number] DOB: 1961-08-31 DOA: 04/28/2021 PCP: [name], DO   No chief complaint on file.  Level of care: Progressive  Brief Admission History:  [name] [id number]:[id number] is a 60 y.o. female, reportedly with history of ITP since age 79 that has not caused her problems over her life presented for admission by Dr. Constance Haw.  She apparently had a perforated stercoral ulcer and has had a colostomy in place since February 2022.  She presented for colostomy reversal.  Her platelet counts had been normal up until recently on 04/29/2021 her platelets were noted to be 91 and postoperative on 04/30/2021 her platelets had dropped down to 10.  She is not having any active bleeding at this time and she reports no significant bruising. Pt was seen by Dr. Alvy Bimler on 6/22 and 1 unit platelets given.    Update 05/01/21.  Pt c/o abd pain and distension, STAT ordered by surgery with findings of a small pelvic hematoma 250 cc or less per radiology. Discussed with surgery no need to take to OR at this time.    Platelets down to 8.   I spoke with hematologist on-call Dr. Alvy Bimler and spoke with Dr. Constance Haw surgery and because we have no in person hematology coverage at [facility] we are transferrring patient to [facility] so that she can be seen and followed by Dr. Alvy Bimler who agreed to see patient in consultation.  Pt is being transfused 2 units platelets and 2 units PRBC.   [facility] will become attending and [facility] will consult when transferred to [facility].    Assessment & Plan:   Principal Problem:   Colostomy in place [facility]) Active Problems:   GAD (generalized anxiety disorder)   Depression   Essential hypertension   Epilepsy ([place])   Insomnia   Current smoker   GERD (gastroesophageal reflux disease)   Osteopenia   Obesity (BMI 30-39.9)   Hyperlipemia   Benzodiazepine dependence ([place])   Parastomal hernia without obstruction or gangrene  Acute severe  thrombocytopenia/acute anemia-patient developed severe thrombocytopenia postoperatively/status post reversal of colostomy.  She was given 1 unit of platelet transfusion on 6/22 and additional 2 units on 05/01/2021.  She was also seen by oncology in consultation on 04/30/2021 since patient has history of ITP grandchild.  Oncology had opined that she may likely had HIT, hold heparin products were discontinued and HIT panel was ordered.  Which is still pending.  Due to persistent thrombocytopenia, she was transferred from [facility] to [facility] long after consultation with oncology.  Her hemoglobin also dropped to 7.6 on 05/01/2021 and She also received 2 units of PRBC transfusion on 05/01/2021.  Her hemoglobin as well as platelets have started to improve.  Oncology on board.  No indication of transfusion.  Await HIT panel to result.  Pelvic hematoma: Due to abdominal pain and drop in hemoglobin, CT abdomen was done on 05/01/2021 which showed pelvic hematoma of 11.1 into 4.8 into 8.2 cm.  Per surgery, no indication of surgical exploration.  POD #4s/p stomy reversal - Pt remains on clear liquid diet, management per surgical team including pain management.  Essential hypertension -fairly controlled.  Continue atenolol which is home dose and as needed hydralazine.  History of epilepsy - no recent seizure activity, remains stable on gabapentin.   Tobacco - she has been counseled on cessation, did not give nicotine patch in setting of severe thrombocytopenia  Postop urinary retention - foley catheter placed 05/01/21.    Hypokalemia:  We will replace.  DVT prophylaxis: SCDs Code Status: Full  Family Communication: plan of care discussed with patient at bedside, verbalized understanding Disposition: TBD Status is: Inpatient  Remains inpatient appropriate because:IV treatments appropriate due to intensity of illness or inability to take PO and Inpatient level of care appropriate due to severity of illness  Dispo: The  patient is from: Home              Anticipated d/c is to:  TBD              Patient currently is not medically stable to d/c.   Difficult to place patient No   Consultants:  Hematology / oncology  General surgery  Procedures:  Colostomy reversal 04/30/21   Antimicrobials:  N/a   Subjective: Patient seen and examined, she complains of significant pain in the abdomen.  No other complaint.  No bowel movement.  Objective: Vitals:   05/01/21 2357 05/02/21 0500 05/02/21 0641 05/02/21 0902  BP: 119/67  139/78 (!) 152/94  Pulse: 73  76 81  Resp: 17  19   Temp: 97.9 F (36.6 C)  97.7 F (36.5 C)   TempSrc:      SpO2: 95%  93%   Weight:  81.2 kg    Height:        Intake/Output Summary (Last 24 hours) at 05/02/2021 1039 Last data filed at 05/02/2021 0630 Gross per 24 hour  Intake 2092.24 ml  Output 1775 ml  Net 317.24 ml    Filed Weights   04/30/21 1442 05/01/21 0500 05/02/21 0500  Weight: 79.2 kg 81 kg 81.2 kg    Examination:  General exam: Appears in pain Respiratory system: Clear to auscultation. Respiratory effort normal. Cardiovascular system: S1 & S2 heard, RRR. No JVD, murmurs, rubs, gallops or clicks. No pedal edema. Gastrointestinal system: Abdomen is soft, mildly distended but severe generalized tenderness no organomegaly or masses felt.  Reduced bowel sounds Central nervous system: Alert and oriented. No focal neurological deficits. Extremities: Symmetric 5 x 5 power. Skin: No rashes, lesions or ulcers.  Psychiatry: Judgement and insight appear normal. Mood & affect appropriate.    Data Reviewed: I have personally reviewed following labs and imaging studies  CBC: Recent Labs  Lab 04/29/21 0532 04/30/21 0627 04/30/21 0922 05/01/21 0541 05/01/21 1612 05/02/21 0500  WBC 16.2* 13.2* 14.2* 10.1 14.9* 11.1*  NEUTROABS 11.6* 8.8* 9.7* 6.2 9.5*  --   HGB 12.1 9.7* 9.2* 7.6* 11.4* 10.6*  HCT 37.7 30.8* 28.2* 23.3* 33.7* 30.4*  MCV 84.5 86.0 84.9 85.0 84.3  82.6  PLT 91* 10* 10*  10* 8* 41* 55*     Basic Metabolic Panel: Recent Labs  Lab 04/29/21 0532 04/30/21 0440 04/30/21 0922 05/01/21 0541 05/02/21 0500  NA 134* 134* 134* 135 140  K 3.9 3.5 3.7 3.4* 3.2*  CL 99 96* 96* 98 102  CO2 28 28 32 32 32  GLUCOSE 138* 116* 128* 98 93  BUN 9 8 9 8 8   CREATININE 0.61 0.64 0.63 0.51 0.55  CALCIUM 8.4* 8.2* 8.2* 7.6* 8.4*  MG 1.9 2.1  --   --   --   PHOS 2.7 1.8*  --   --   --      GFR: Estimated Creatinine Clearance: 72.9 mL/min (by C-G formula based on SCr of 0.55 mg/dL).  Liver Function Tests: Recent Labs  Lab 04/30/21 0922 05/01/21 0541 05/02/21 0500  AST 20 20 16   ALT 25 20 17   ALKPHOS 78  66 70  BILITOT 0.5 0.7 0.7  PROT 5.7* 5.3* 5.5*  ALBUMIN 2.8* 2.5* 2.7*     CBG: Recent Labs  Lab 04/29/21 1112  GLUCAP 134*     Recent Results (from the past 240 hour(s))  SARS CORONAVIRUS 2 (TAT 6-24 HRS) Nasopharyngeal Nasopharyngeal Swab     Status: None   Collection Time: 04/25/21  1:44 PM   Specimen: Nasopharyngeal Swab  Result Value Ref Range Status   SARS Coronavirus 2 NEGATIVE NEGATIVE Final    Comment: (NOTE) SARS-CoV-2 target nucleic acids are NOT DETECTED.  The SARS-CoV-2 RNA is generally detectable in upper and lower respiratory specimens during the acute phase of infection. Negative results do not preclude SARS-CoV-2 infection, do not rule out co-infections with other pathogens, and should not be used as the sole basis for treatment or other patient management decisions. Negative results must be combined with clinical observations, patient history, and epidemiological information. The expected result is Negative.  Fact Sheet for Patients: SugarRoll.be  Fact Sheet for Healthcare Providers: https://www.woods-mathews.com/  This test is not yet approved or cleared by the [place] FDA and  has been authorized for detection and/or diagnosis of SARS-CoV-2  by FDA under an Emergency Use Authorization (EUA). This EUA will remain  in effect (meaning this test can be used) for the duration of the COVID-19 declaration under Se ction 564(b)(1) of the Act, 21 U.S.C. section 360bbb-3(b)(1), unless the authorization is terminated or revoked sooner.  Performed at [facility], [place] [address] [place], [place] [address]   MRSA Next Gen by PCR, Nasal     Status: None   Collection Time: 04/30/21  2:44 PM   Specimen: Nasal Mucosa; Nasal Swab  Result Value Ref Range Status   MRSA by PCR Next Gen NOT DETECTED NOT DETECTED Final    Comment: (NOTE) The GeneXpert MRSA Assay (FDA approved for NASAL specimens only), is one component of a comprehensive MRSA colonization surveillance program. It is not intended to diagnose MRSA infection nor to guide or monitor treatment for MRSA infections. Test performance is not FDA approved in patients less than 21 years old. Performed at [facility], [address], [place], [place] [address]      Radiology Studies: CT ABDOMEN PELVIS WO CONTRAST  Result Date: 05/01/2021 CLINICAL DATA:  Post colostomy reversal, postoperative anemia, fall in hemoglobin from 12-7 g EXAM: CT ABDOMEN AND PELVIS WITHOUT CONTRAST TECHNIQUE: Multidetector CT imaging of the abdomen and pelvis was performed following the standard protocol without IV contrast. Sagittal and coronal MPR images reconstructed from axial data set. No oral contrast administered. COMPARISON:  01/15/2021 FINDINGS: Lower chest: Bibasilar atelectasis Hepatobiliary: Gallbladder surgically absent.  Liver unremarkable. Pancreas: Atrophic, otherwise normal appearance Spleen: Normal appearance Adrenals/Urinary Tract: Adrenal glands, kidneys, and ureters normal appearance. Bladder decompressed by Foley catheter. Stomach/Bowel: Post colostomy reversal with anastomotic staple line in sigmoid colon. Prominent small bowel loops consistent with postoperative ileus. Normal appendix.  Stomach unremarkable. Vascular/Lymphatic: Minimal atherosclerotic calcification aorta. Aorta normal caliber. No adenopathy. Reproductive: Uterus surgically absent with nonvisualization of ovaries Other: Scattered free air consistent with recent surgery. Small fluid collection at the colostomy takedown site, low-attenuation, 7.4 x 2.7 x 4.5 cm image 58. Minimal free fluid. High attenuation collection identified within the pelvis, 11.1 x 4.8 x 8.2 cm consistent with hematoma. No hernia. Musculoskeletal: Prior lumbar fusion L4-S1. Osseous structures otherwise unremarkable. IMPRESSION: Post colostomy reversal with anastomotic staple line in sigmoid colon. Prominent small bowel loops consistent with postoperative ileus.  High attenuation collection within the pelvis 11.1 x 4.8 x 8.2 cm consistent with hematoma. Small fluid collection at the colostomy takedown site 7.4 x 2.7 cm, could represent a sterile or infected postoperative collection. Bibasilar atelectasis. Aortic Atherosclerosis (ICD10-I70.0). Electronically Signed   By: Lavonia Dana M.D.   On: 05/01/2021 11:44    Scheduled Meds:  sodium chloride   Intravenous Once   sodium chloride   Intravenous Once   acetaminophen  1,000 mg Oral Q6H   amitriptyline  50 mg Oral QHS   atenolol  50 mg Oral Daily   busPIRone  15 mg Oral TID   Chlorhexidine Gluconate Cloth  6 each Topical Daily   citalopram  40 mg Oral Q breakfast   feeding supplement  237 mL Oral BID BM   gabapentin  300 mg Oral BID   latanoprost  1 drop Both Eyes QHS   loratadine  10 mg Oral Daily   pantoprazole  40 mg Oral Daily   saccharomyces boulardii  250 mg Oral BID   sodium chloride flush  10-40 mL Intracatheter Q12H   traZODone  150 mg Oral QHS   Continuous Infusions:  lactated ringers 50 mL/hr at 05/01/21 2148   potassium chloride 10 mEq (05/02/21 1019)    LOS: 4 days   Time spent: 39 mins   Betty Cheney, MD How to contact the [facility] Attending or Consulting provider [place] or  covering provider during after hours [place], for this patient?  Check the care team in [facility] and look for a) attending/consulting [facility] provider listed and b) the [facility] team listed Log into [email or web address] and use [place]'s universal password to access. If you do not have the password, please contact the hospital operator. Locate the [facility] provider you are looking for under [facility] and page to a number that you can be directly reached. If you still have difficulty reaching the provider, please page the [facility] (Director on Call) for the Hospitalists listed on amion for assistance.  05/02/2021, 10:39 AM

## 2021-05-02 NOTE — Progress Notes (Addendum)
HEMATOLOGY-ONCOLOGY PROGRESS NOTE  I have seen the patient, examined her and edited the notes as follows ASSESSMENT AND PLAN: Severe, acute thrombocytopenia, concern for heparin induced thrombocytopenia/history of childhood ITP She has no clinical signs of thrombosis and overall this is suspicious for HIT, nonimmune mediated type I HIT panel and SRA pending Status post 1 unit of platelets on 6/22 and 2 additional units of platelets on 6/23 with improvement of platelet count Recommend platelet transfusion support to keep platelet count above 10,000 Avoid heparin containing products and NSAIDs There is no need to consider anticoagulation therapy at this time Acute ITP cannot be excluded although thought to be rare in this clinical setting  S/p colostomy reversal surgery/postop pelvic hematoma Transfuse platelets as noted above General surgery following Overall, it is best not to place a drain due to her platelet issue  Acute on chronic back pain After much discussion with the patient, I have ordered scheduled oxycodone and increase the dose of amitriptyline as requested by the patient  Mild postop anemia Status post 2 units PRBCs 6/23 with improvement of hemoglobin Monitor hemoglobin closely secondary to postop pelvic hematoma  Discharge planning If her blood counts are stable over the weekend, she can be discharged and I will arrange outpatient follow-up  Signed, Heath Lark, MD  SUBJECTIVE: The patient was transferred from [facility] last evening Reports diffuse abdominal pain this morning & acute on chronic back pain She felt that her pain medicine was not ordered correctly and she was very upset States that she had flatus and bowel movements yesterday but none so far today She has not seen any bleeding General surgery following the patient  REVIEW OF SYSTEMS:   Constitutional: Denies fevers, chills  Eyes: Denies blurriness of vision Ears, nose, mouth, throat, and face:  Denies mucositis or sore throat Respiratory: Denies cough, dyspnea or wheezes Cardiovascular: Denies palpitation, chest discomfor reports diffuse abdominal pain this morning. Denies nausea, heartburn or change in bowel habits Skin: Denies abnormal skin rashes Lymphatics: Denies new lymphadenopathy or easy bruising Neurological:Denies numbness, tingling or new weaknesses Behavioral/Psych: Mood is stable, no new changes  Extremities: No lower extremity edema All other systems were reviewed with the patient and are negative.  I have reviewed the past medical history, past surgical history, social history and family history with the patient and they are unchanged from previous note.   PHYSICAL EXAMINATION:  Vitals:   05/01/21 2357 05/02/21 0641  BP: 119/67 139/78  Pulse: 73 76  Resp: 17 19  Temp: 97.9 F (36.6 C) 97.7 F (36.5 C)  SpO2: 95% 93%   Filed Weights   04/30/21 1442 05/01/21 0500 05/02/21 0500  Weight: 79.2 kg 81 kg 81.2 kg    Intake/Output from previous day: 06/23 0701 - 06/24 0700 In: 2467.2 [P.O.:860; I.V.:407.2; Blood:1200] Out: 1775 [Urine:1775]  GENERAL:alert, no distress and comfortable SKIN: skin color, texture, turgor are normal, no rashes or significant lesions ABDOMEN: Hypoactive bowel sounds, mildly distended, diffuse pain with palpation NEURO: alert & oriented x 3 with fluent speech, no focal motor/sensory deficits  LABORATORY DATA:  I have reviewed the data as listed CMP Latest Ref Rng & Units 05/01/2021 04/30/2021 04/30/2021  Glucose 70 - 99 mg/dL 98 128(H) 116(H)  BUN 8 - 23 mg/dL 8 9 8   Creatinine 0.44 - 1.00 mg/dL 0.51 0.63 0.64  Sodium 135 - 145 mmol/L 135 134(L) 134(L)  Potassium 3.5 - 5.1 mmol/L 3.4(L) 3.7 3.5  Chloride 98 - 111 mmol/L 98 96(L) 96(L)  CO2 22 - 32 mmol/L 32 32 28  Calcium 8.9 - 10.3 mg/dL 7.6(L) 8.2(L) 8.2(L)  Total Protein 6.5 - 8.1 g/dL 5.3(L) 5.7(L) -  Total Bilirubin 0.3 - 1.2 mg/dL 0.7 0.5 -  Alkaline Phos 38 - 126 U/L  66 78 -  AST 15 - 41 U/L 20 20 -  ALT 0 - 44 U/L 20 25 -    Lab Results  Component Value Date   WBC 14.9 (H) 05/01/2021   HGB 11.4 (L) 05/01/2021   HCT 33.7 (L) 05/01/2021   MCV 84.3 05/01/2021   PLT 41 (L) 05/01/2021   NEUTROABS 9.5 (H) 05/01/2021   I personally reviewed his CT imaging CT ABDOMEN PELVIS WO CONTRAST  Result Date: 05/01/2021 CLINICAL DATA:  Post colostomy reversal, postoperative anemia, fall in hemoglobin from 12-7 g EXAM: CT ABDOMEN AND PELVIS WITHOUT CONTRAST TECHNIQUE: Multidetector CT imaging of the abdomen and pelvis was performed following the standard protocol without IV contrast. Sagittal and coronal MPR images reconstructed from axial data set. No oral contrast administered. COMPARISON:  01/15/2021 FINDINGS: Lower chest: Bibasilar atelectasis Hepatobiliary: Gallbladder surgically absent.  Liver unremarkable. Pancreas: Atrophic, otherwise normal appearance Spleen: Normal appearance Adrenals/Urinary Tract: Adrenal glands, kidneys, and ureters normal appearance. Bladder decompressed by Foley catheter. Stomach/Bowel: Post colostomy reversal with anastomotic staple line in sigmoid colon. Prominent small bowel loops consistent with postoperative ileus. Normal appendix. Stomach unremarkable. Vascular/Lymphatic: Minimal atherosclerotic calcification aorta. Aorta normal caliber. No adenopathy. Reproductive: Uterus surgically absent with nonvisualization of ovaries Other: Scattered free air consistent with recent surgery. Small fluid collection at the colostomy takedown site, low-attenuation, 7.4 x 2.7 x 4.5 cm image 58. Minimal free fluid. High attenuation collection identified within the pelvis, 11.1 x 4.8 x 8.2 cm consistent with hematoma. No hernia. Musculoskeletal: Prior lumbar fusion L4-S1. Osseous structures otherwise unremarkable. IMPRESSION: Post colostomy reversal with anastomotic staple line in sigmoid colon. Prominent small bowel loops consistent with postoperative ileus.  High attenuation collection within the pelvis 11.1 x 4.8 x 8.2 cm consistent with hematoma. Small fluid collection at the colostomy takedown site 7.4 x 2.7 cm, could represent a sterile or infected postoperative collection. Bibasilar atelectasis. Aortic Atherosclerosis (ICD10-I70.0). Electronically Signed   By: Lavonia Dana M.D.   On: 05/01/2021 11:44      LOS: 4 days

## 2021-05-03 LAB — CBC
HCT: 31.7 % — ABNORMAL LOW (ref 36.0–46.0)
Hemoglobin: 10.7 g/dL — ABNORMAL LOW (ref 12.0–15.0)
MCH: 28.1 pg (ref 26.0–34.0)
MCHC: 33.8 g/dL (ref 30.0–36.0)
MCV: 83.2 fL (ref 80.0–100.0)
Platelets: 103 10*3/uL — ABNORMAL LOW (ref 150–400)
RBC: 3.81 MIL/uL — ABNORMAL LOW (ref 3.87–5.11)
RDW: 15.1 % (ref 11.5–15.5)
WBC: 11 10*3/uL — ABNORMAL HIGH (ref 4.0–10.5)
nRBC: 0.3 % — ABNORMAL HIGH (ref 0.0–0.2)

## 2021-05-03 LAB — COMPREHENSIVE METABOLIC PANEL
ALT: 31 U/L (ref 0–44)
AST: 51 U/L — ABNORMAL HIGH (ref 15–41)
Albumin: 2.6 g/dL — ABNORMAL LOW (ref 3.5–5.0)
Alkaline Phosphatase: 138 U/L — ABNORMAL HIGH (ref 38–126)
Anion gap: 5 (ref 5–15)
BUN: 8 mg/dL (ref 8–23)
CO2: 31 mmol/L (ref 22–32)
Calcium: 8.4 mg/dL — ABNORMAL LOW (ref 8.9–10.3)
Chloride: 102 mmol/L (ref 98–111)
Creatinine, Ser: 0.54 mg/dL (ref 0.44–1.00)
GFR, Estimated: >60 mL/min (ref >60)
Glucose, Bld: 99 mg/dL (ref 70–99)
Potassium: 3.6 mmol/L (ref 3.5–5.1)
Sodium: 138 mmol/L (ref 135–145)
Total Bilirubin: 0.8 mg/dL (ref 0.3–1.2)
Total Protein: 5.6 g/dL — ABNORMAL LOW (ref 6.5–8.1)

## 2021-05-03 LAB — MAGNESIUM: Magnesium: 1.6 mg/dL — ABNORMAL LOW (ref 1.7–2.4)

## 2021-05-03 MED ORDER — POLYETHYLENE GLYCOL 3350 17 G PO PACK
17.0000 g | PACK | Freq: Every day | ORAL | Status: DC
  Administered 2021-05-03 – 2021-05-04 (×2): 17 g via ORAL
  Filled 2021-05-03 (×2): qty 1

## 2021-05-03 MED ORDER — MAGNESIUM SULFATE 2 GM/50ML IV SOLN
2.0000 g | Freq: Once | INTRAVENOUS | Status: AC
  Administered 2021-05-03: 2 g via INTRAVENOUS
  Filled 2021-05-03: qty 50

## 2021-05-03 NOTE — Plan of Care (Signed)
  Problem: Clinical Measurements: Goal: Will remain free from infection Outcome: Progressing Goal: Diagnostic test results will improve Outcome: Progressing   Problem: Safety: Goal: Ability to remain free from injury will improve Outcome: Progressing   Problem: Skin Integrity: Goal: Risk for impaired skin integrity will decrease Outcome: Progressing

## 2021-05-03 NOTE — Progress Notes (Signed)
PROGRESS NOTE   [name]  LYY:[id number] DOB: [birth year] DOA: 04/28/2021 PCP: Betty Norlander, DO   No chief complaint on file.  Level of care: Progressive  Brief Admission History:  [name] is a 60 y.o. female, reportedly with history of ITP since age 11 that has not caused her problems over her life presented for admission by Dr. Constance Garza.  She apparently had a perforated stercoral ulcer and has had a colostomy in place since February 2022.  She presented for colostomy reversal.  Her platelet counts had been normal up until recently on 04/29/2021 her platelets were noted to be 91 and postoperative on 04/30/2021 her platelets had dropped down to 10.  She is not having any active bleeding at this time and she reports no significant bruising. Pt was seen by Dr. Alvy Garza on 6/22 and 1 unit platelets given.    Update 05/01/21.  Pt c/o abd pain and distension, STAT ordered by surgery with findings of a small pelvic hematoma 250 cc or less per radiology. Discussed with surgery no need to take to OR at this time.    Platelets down to 8.   I spoke with hematologist on-call Dr. Alvy Garza and spoke with Dr. Constance Garza surgery and because we have no in person hematology coverage at [facility] we are transferrring patient to [facility] so that she can be seen and followed by Dr. Alvy Garza who agreed to see patient in consultation.  Pt is being transfused 2 units platelets and 2 units PRBC.   TR[facility] will become attending and CCS will consult when transferred to [facility].    Assessment & Plan:   Principal Problem:   Colostomy in place [name]) Active Problems:   GAD (generalized anxiety disorder)   Depression   Essential hypertension   Epilepsy ([place])   Insomnia   Current smoker   GERD (gastroesophageal reflux disease)   Osteopenia   Obesity (BMI 30-39.9)   Hyperlipemia   Benzodiazepine dependence ([place])   Parastomal hernia without obstruction or gangrene  Acute severe thrombocytopenia/acute anemia-patient developed  severe thrombocytopenia postoperatively/status post reversal of colostomy.  She was given 1 unit of platelet transfusion on 6/22 and additional 2 units on 05/01/2021.  She was also seen by oncology in consultation on 04/30/2021 since patient has history of ITP grandchild.  Oncology had opined that she may likely had HIT, hold heparin products were discontinued and HIT panel was ordered.  Which is still pending.  Due to persistent thrombocytopenia, she was transferred from [facility] to [facility] long after consultation with oncology.  Her hemoglobin also dropped to 7.6 on 05/01/2021 and She also received 2 units of PRBC transfusion on 05/01/2021.  Hemoglobin is stable and platelet improved.  No signs of bleeding.  HIT antibodies negative.  Awaiting further recommendations from oncology.  Pelvic hematoma: Due to abdominal pain and drop in hemoglobin, CT abdomen was done on 05/01/2021 which showed pelvic hematoma of 11.1 into 4.8 into 8.2 cm.  Per surgery, no indication of surgical exploration.  POD #4s/p stomy reversal -still complains of abdominal pain but better than yesterday.  Some tenderness.  Seen by general surgery and advance to liquid diet.  Essential hypertension -fairly controlled.  Continue atenolol which is home dose and as needed hydralazine.  History of epilepsy - no recent seizure activity, remains stable on gabapentin.   Tobacco - she has been counseled on cessation, did not give nicotine patch in setting of severe thrombocytopenia  Postop urinary retention - foley catheter placed 05/01/21.  We will remove Foley catheter today.  Hypokalemia: Resolved.  Hypomagnesemia: We will replace.  DVT prophylaxis: SCDs Code Status: Full  Family Communication: plan of care discussed with patient at bedside, verbalized understanding Disposition: TBD Status is: Inpatient  Remains inpatient appropriate because:IV treatments appropriate due to intensity of illness or inability to take PO and Inpatient level of  care appropriate due to severity of illness  Dispo: The patient is from: Home              Anticipated d/c is to:  TBD              Patient currently is not medically stable to d/c.   Difficult to place patient No   Consultants:  Hematology / oncology  General surgery  Procedures:  Colostomy reversal 04/30/21   Antimicrobials:  N/a   Subjective: Seen and examined.  Sitting in the chair, looking much better than yesterday.  Feels slightly better.  No other complaint except some abdominal pain.  Objective: Vitals:   05/02/21 1255 05/02/21 2057 05/03/21 0407 05/03/21 0500  BP: 137/82 (!) 145/86 138/85   Pulse: 76 83 82   Resp: 20 18 18    Temp: 98 F (36.7 C) 99 F (37.2 C) 97.9 F (36.6 C)   TempSrc:  Oral Oral   SpO2: 92% 94% 92%   Weight:    81.8 kg  Height:        Intake/Output Summary (Last 24 hours) at 05/03/2021 1056 Last data filed at 05/03/2021 0610 Gross per 24 hour  Intake 1810.58 ml  Output 2100 ml  Net -289.42 ml    Filed Weights   05/01/21 0500 05/02/21 0500 05/03/21 0500  Weight: 81 kg 81.2 kg 81.8 kg    Examination:  General exam: Appears calm and comfortable  Respiratory system: Clear to auscultation. Respiratory effort normal. Cardiovascular system: S1 & S2 heard, RRR. No JVD, murmurs, rubs, gallops or clicks. No pedal edema. Gastrointestinal system: Abdomen is soft, mildly distended and generalized tenderness, no organomegaly or masses felt. Normal bowel sounds heard. Central nervous system: Alert and oriented. No focal neurological deficits. Extremities: Symmetric 5 x 5 power. Skin: No rashes, lesions or ulcers.  Psychiatry: Judgement and insight appear normal. Mood & affect appropriate.   Data Reviewed: I have personally reviewed following labs and imaging studies  CBC: Recent Labs  Lab 04/29/21 0532 04/30/21 0627 04/30/21 0922 05/01/21 0541 05/01/21 1612 05/02/21 0500 05/03/21 0350  WBC 16.2* 13.2* 14.2* 10.1 14.9* 11.1* 11.0*   NEUTROABS 11.6* 8.8* 9.7* 6.2 9.5*  --   --   HGB 12.1 9.7* 9.2* 7.6* 11.4* 10.6* 10.7*  HCT 37.7 30.8* 28.2* 23.3* 33.7* 30.4* 31.7*  MCV 84.5 86.0 84.9 85.0 84.3 82.6 83.2  PLT 91* 10* 10*  10* 8* 41* 55* 103*     Basic Metabolic Panel: Recent Labs  Lab 04/29/21 0532 04/30/21 0440 04/30/21 0922 05/01/21 0541 05/02/21 0500 05/03/21 0350  NA 134* 134* 134* 135 140 138  K 3.9 3.5 3.7 3.4* 3.2* 3.6  CL 99 96* 96* 98 102 102  CO2 28 28 32 32 32 31  GLUCOSE 138* 116* 128* 98 93 99  BUN 9 8 9 8 8 8   CREATININE 0.61 0.64 0.63 0.51 0.55 0.54  CALCIUM 8.4* 8.2* 8.2* 7.6* 8.4* 8.4*  MG 1.9 2.1  --   --   --  1.6*  PHOS 2.7 1.8*  --   --   --   --  GFR: Estimated Creatinine Clearance: 73.2 mL/min (by C-G formula based on SCr of 0.54 mg/dL).  Liver Function Tests: Recent Labs  Lab 04/30/21 0922 05/01/21 0541 05/02/21 0500 05/03/21 0350  AST 20 20 16  51*  ALT 25 20 17 31   ALKPHOS 78 66 70 138*  BILITOT 0.5 0.7 0.7 0.8  PROT 5.7* 5.3* 5.5* 5.6*  ALBUMIN 2.8* 2.5* 2.7* 2.6*     CBG: Recent Labs  Lab 04/29/21 1112  GLUCAP 134*     Recent Results (from the past 240 hour(s))  SARS CORONAVIRUS 2 (TAT 6-24 HRS) Nasopharyngeal Nasopharyngeal Swab     Status: None   Collection Time: 04/25/21  1:44 PM   Specimen: Nasopharyngeal Swab  Result Value Ref Range Status   SARS Coronavirus 2 NEGATIVE NEGATIVE Final    Comment: (NOTE) SARS-CoV-2 target nucleic acids are NOT DETECTED.  The SARS-CoV-2 RNA is generally detectable in upper and lower respiratory specimens during the acute phase of infection. Negative results do not preclude SARS-CoV-2 infection, do not rule out co-infections with other pathogens, and should not be used as the sole basis for treatment or other patient management decisions. Negative results must be combined with clinical observations, patient history, and epidemiological information. The expected result is Negative.  Fact Sheet for  Patients: SugarRoll.be  Fact Sheet for Healthcare Providers: https://www.woods-mathews.com/  This test is not yet approved or cleared by the [place] FDA and  has been authorized for detection and/or diagnosis of SARS-CoV-2 by FDA under an Emergency Use Authorization (EUA). This EUA will remain  in effect (meaning this test can be used) for the duration of the COVID-19 declaration under Se ction 564(b)(1) of the Act, 21 U.S.C. section 360bbb-3(b)(1), unless the authorization is terminated or revoked sooner.  Performed at [facility], [place] [address], [place], [place] [address]   MRSA Next Gen by PCR, Nasal     Status: None   Collection Time: 04/30/21  2:44 PM   Specimen: Nasal Mucosa; Nasal Swab  Result Value Ref Range Status   MRSA by PCR Next Gen NOT DETECTED NOT DETECTED Final    Comment: (NOTE) The GeneXpert MRSA Assay (FDA approved for NASAL specimens only), is one component of a comprehensive MRSA colonization surveillance program. It is not intended to diagnose MRSA infection nor to guide or monitor treatment for MRSA infections. Test performance is not FDA approved in patients less than 57 years old. Performed at [facility], [address], [place], [place] [address]      Radiology Studies: No results found.  Scheduled Meds:  sodium chloride   Intravenous Once   sodium chloride   Intravenous Once   acetaminophen  1,000 mg Oral Q6H   amitriptyline  100 mg Oral BID   atenolol  50 mg Oral Daily   busPIRone  15 mg Oral TID   Chlorhexidine Gluconate Cloth  6 each Topical Daily   citalopram  40 mg Oral Q breakfast   feeding supplement  237 mL Oral BID BM   gabapentin  300 mg Oral BID   latanoprost  1 drop Both Eyes QHS   loratadine  10 mg Oral Daily   oxyCODONE  10 mg Oral Q6H   pantoprazole  40 mg Oral Daily   polyethylene glycol  17 g Oral Daily   saccharomyces boulardii  250 mg Oral BID   sodium chloride  flush  10-40 mL Intracatheter Q12H   traZODone  150 mg Oral QHS   Continuous Infusions:  lactated ringers 50 mL/hr at 05/03/21 0830    LOS: 5 days   Time spent: 30 mins   Darliss Cheney, MD How to contact the [facility] Attending or Consulting provider [place] or covering provider during after hours [place], for this patient?  Check the care team in [facility] and look for a) attending/consulting [facility] provider listed and b) the [facility] team listed Log into [email or web address] and use [employer]'s universal password to access. If you do not have the password, please contact the Garza operator. Locate the [facility] provider you are looking for under [facility] and page to a number that you can be directly reached. If you still have difficulty reaching the provider, please page the [facility] (Director on Call) for the Hospitalists listed on amion for assistance.  05/03/2021, 10:56 AM

## 2021-05-03 NOTE — Evaluation (Signed)
Physical Therapy Evaluation Patient Details Name: [name] MRN: [id number] DOB: 02/09/1961 Today's Date: 05/03/2021   History of Present Illness  [name]  is a 60 y.o. female, reportedly with history of ITP since age 51 .Presented for admission 04/28/2021  for colostomy takedown.Has had a colostomy in place since February 2022.  Postoperative on 04/30/2021 her platelets had dropped .  Clinical Impression    The  patient sitting in recliner. Patient ambulated x 55' using RW. ABD binder not on patient at this time .Patient lives with sister. Has DME. Patient will benefit from  PT  while in acute care to increase mobility  to return home. Patient reporting 8/10 pain/cramoping while ambulating.    Follow Up Recommendations Home health PT (requests [name] at [place] care)    Equipment Recommendations  None recommended by PT    Recommendations for Other Services       Precautions / Restrictions Precautions Precautions: Fall Precaution Comments: has order for abd binder, does not have it on while sitting in recliner. Did not place for this visit. Required Braces or Orthoses: Other Brace Other Brace: Abdominal binder Restrictions Weight Bearing Restrictions: No      Mobility  Bed Mobility               General bed mobility comments: in recliner    Transfers Overall transfer level: Needs assistance Equipment used: None Transfers: Sit to/from Stand Sit to Stand: Supervision         General transfer comment: guardeing abdomen, stands without assistance  Ambulation/Gait Ambulation/Gait assistance: Supervision Gait Distance (Feet): 55 Feet Assistive device: Rolling walker (2 wheeled) Gait Pattern/deviations: Step-through pattern     General Gait Details: decr  Stairs            Wheelchair Mobility    Modified Rankin (Stroke Patients Only)       Balance Overall balance assessment: No apparent balance deficits (not formally  assessed)                                           Pertinent Vitals/Pain Pain Assessment: 0-10 Pain Score: 8  Pain Location: abdomen Pain Descriptors / Indicators: Cramping;Discomfort;Guarding Pain Intervention(s): Premedicated before session;Ice applied;Other (comment) (provided pillow)    Home Living Family/patient expects to be discharged to:: Private residence Living Arrangements: Other relatives (twin sister) Available Help at Discharge: Family;Available 24 hours/day Type of Home: Mobile home Home Access: Stairs to enter Entrance Stairs-Rails: Can reach both Entrance Stairs-Number of Steps: 4 Home Layout: One level        Prior Function Level of Independence: Independent         Comments: Hydrographic surveyor, drives     Hand Dominance   Dominant Hand: Right    Extremity/Trunk Assessment   Upper Extremity Assessment Upper Extremity Assessment: Overall WFL for tasks assessed    Lower Extremity Assessment Lower Extremity Assessment: Overall WFL for tasks assessed    Cervical / Trunk Assessment Cervical / Trunk Assessment:  (guarding in abd, limited  forward flex to stand)  Communication      Cognition Arousal/Alertness: Awake/alert Behavior During Therapy: WFL for tasks assessed/performed Overall Cognitive Status: Within Functional Limits for tasks assessed  General Comments      Exercises     Assessment/Plan    PT Assessment Patient needs continued PT services  PT Problem List Decreased mobility;Decreased activity tolerance;Pain;Decreased range of motion       PT Treatment Interventions DME instruction;Gait training;Therapeutic exercise;Functional mobility training;Patient/family education    PT Goals (Current goals can be found in the Care Plan section)  Acute Rehab PT Goals Patient Stated Goal: home, PT Goal Formulation: With patient Time For Goal Achievement:  05/17/21 Potential to Achieve Goals: Good    Frequency Min 3X/week   Barriers to discharge        Co-evaluation               AM-PAC PT "6 Clicks" Mobility  Outcome Measure Help needed turning from your back to your side while in a flat bed without using bedrails?: A Little Help needed moving from lying on your back to sitting on the side of a flat bed without using bedrails?: A Little Help needed moving to and from a bed to a chair (including a wheelchair)?: A Little Help needed standing up from a chair using your arms (e.g., wheelchair or bedside chair)?: A Little Help needed to walk in hospital room?: A Little Help needed climbing 3-5 steps with a railing? : A Little 6 Click Score: 18    End of Session     Patient left: in chair Nurse Communication: Mobility status PT Visit Diagnosis: Difficulty in walking, not elsewhere classified (R26.2);Pain    Time: 3817-7116 PT Time Calculation (min) (ACUTE ONLY): 20 min   Charges:   PT Evaluation $PT Eval Low Complexity: [place] PT Acute Rehabilitation Services Pager [phone] Office [phone]    Claretha Cooper 05/03/2021, 9:10 AM

## 2021-05-03 NOTE — Progress Notes (Signed)
5 Days Post-Op   Subjective/Chief Complaint: Patient is quite sore, but otherwise doing well Small BM this morning Platelets improved No nausea or vomiting  Objective: Vital signs in last 24 hours: Temp:  [97.9 F (36.6 C)-99 F (37.2 C)] 97.9 F (36.6 C) (06/25 0407) Pulse Rate:  [76-83] 82 (06/25 0407) Resp:  [18-20] 18 (06/25 0407) BP: (137-145)/(82-86) 138/85 (06/25 0407) SpO2:  [92 %-94 %] 92 % (06/25 0407) Weight:  [81.8 kg] 81.8 kg (06/25 0500) Last BM Date: 05/03/21  Intake/Output from previous day: 06/24 0701 - 06/25 0700 In: 2093.5 [P.O.:300; I.V.:1196.4; IV Piggyback:597.1] Out: 2100 [Urine:2100] Intake/Output this shift: No intake/output data recorded.  PE Lower midline incision c/d/I through honeycomb dressing LLQ ostomy site - dressing dry with no drainage noted  Lab Results:  Recent Labs    05/02/21 0500 05/03/21 0350  WBC 11.1* 11.0*  HGB 10.6* 10.7*  HCT 30.4* 31.7*  PLT 55* 103*   BMET Recent Labs    05/02/21 0500 05/03/21 0350  NA 140 138  K 3.2* 3.6  CL 102 102  CO2 32 31  GLUCOSE 93 99  BUN 8 8  CREATININE 0.55 0.54  CALCIUM 8.4* 8.4*   PT/INR Recent Labs    04/30/21 0922 05/01/21 1612  LABPROT 15.0 13.0  INR 1.2 1.0   ABG No results for input(s): PHART, HCO3 in the last 72 hours.  Invalid input(s): PCO2, PO2  Studies/Results: CT ABDOMEN PELVIS WO CONTRAST  Result Date: 05/01/2021 CLINICAL DATA:  Post colostomy reversal, postoperative anemia, fall in hemoglobin from 12-7 g EXAM: CT ABDOMEN AND PELVIS WITHOUT CONTRAST TECHNIQUE: Multidetector CT imaging of the abdomen and pelvis was performed following the standard protocol without IV contrast. Sagittal and coronal MPR images reconstructed from axial data set. No oral contrast administered. COMPARISON:  01/15/2021 FINDINGS: Lower chest: Bibasilar atelectasis Hepatobiliary: Gallbladder surgically absent.  Liver unremarkable. Pancreas: Atrophic, otherwise normal appearance  Spleen: Normal appearance Adrenals/Urinary Tract: Adrenal glands, kidneys, and ureters normal appearance. Bladder decompressed by Foley catheter. Stomach/Bowel: Post colostomy reversal with anastomotic staple line in sigmoid colon. Prominent small bowel loops consistent with postoperative ileus. Normal appendix. Stomach unremarkable. Vascular/Lymphatic: Minimal atherosclerotic calcification aorta. Aorta normal caliber. No adenopathy. Reproductive: Uterus surgically absent with nonvisualization of ovaries Other: Scattered free air consistent with recent surgery. Small fluid collection at the colostomy takedown site, low-attenuation, 7.4 x 2.7 x 4.5 cm image 58. Minimal free fluid. High attenuation collection identified within the pelvis, 11.1 x 4.8 x 8.2 cm consistent with hematoma. No hernia. Musculoskeletal: Prior lumbar fusion L4-S1. Osseous structures otherwise unremarkable. IMPRESSION: Post colostomy reversal with anastomotic staple line in sigmoid colon. Prominent small bowel loops consistent with postoperative ileus. High attenuation collection within the pelvis 11.1 x 4.8 x 8.2 cm consistent with hematoma. Small fluid collection at the colostomy takedown site 7.4 x 2.7 cm, could represent a sterile or infected postoperative collection. Bibasilar atelectasis. Aortic Atherosclerosis (ICD10-I70.0). Electronically Signed   By: Lavonia Dana M.D.   On: 05/01/2021 11:44    Anti-infectives: Anti-infectives (From admission, onward)    Start     Dose/Rate Route Frequency Ordered Stop   04/29/21 1000  cefoTEtan (CEFOTAN) 1 g in sodium chloride 0.9 % 100 mL IVPB        1 g 200 mL/hr over 30 Minutes Intravenous Every 30 min 04/29/21 0855 04/29/21 1138   04/28/21 2000  cefoTEtan (CEFOTAN) 2 g in sodium chloride 0.9 % 100 mL IVPB  Status:  Discontinued  2 g 200 mL/hr over 30 Minutes Intravenous Every 12 hours 04/28/21 1608 04/29/21 0855   04/28/21 0630  cefoTEtan (CEFOTAN) 2 g in sodium chloride 0.9 % 100  mL IVPB        2 g 200 mL/hr over 30 Minutes Intravenous On call to O.R. 04/28/21 9798 04/28/21 0745       Assessment/Plan: 60 yo female s/p open colostomy reversal for stercoral ulcer in past. She had worsening thrombocytopenia with acute blood loss anemia after surgery with multiple bloody bowel movements. She responded appropriately to 2 units pRBC yesterday with Hemoglobin 11.4 this morning. Platelets have come up to 41 from 8 yesterday. -advance to full liquids -restart daily Miralax -thrombocytopenia/ anemia - improving -goal to ambulate today  LOS: 5 days    [name] 05/03/2021

## 2021-05-04 ENCOUNTER — Encounter (HOSPITAL_COMMUNITY): Payer: Self-pay | Admitting: General Surgery

## 2021-05-04 DIAGNOSIS — D62 Acute posthemorrhagic anemia: Secondary | ICD-10-CM

## 2021-05-04 DIAGNOSIS — F419 Anxiety disorder, unspecified: Secondary | ICD-10-CM | POA: Insufficient documentation

## 2021-05-04 DIAGNOSIS — D696 Thrombocytopenia, unspecified: Secondary | ICD-10-CM

## 2021-05-04 DIAGNOSIS — Z862 Personal history of diseases of the blood and blood-forming organs and certain disorders involving the immune mechanism: Secondary | ICD-10-CM | POA: Insufficient documentation

## 2021-05-04 DIAGNOSIS — R7989 Other specified abnormal findings of blood chemistry: Secondary | ICD-10-CM | POA: Insufficient documentation

## 2021-05-04 DIAGNOSIS — M419 Scoliosis, unspecified: Secondary | ICD-10-CM | POA: Insufficient documentation

## 2021-05-04 DIAGNOSIS — M4800 Spinal stenosis, site unspecified: Secondary | ICD-10-CM | POA: Insufficient documentation

## 2021-05-04 DIAGNOSIS — M5106 Intervertebral disc disorders with myelopathy, lumbar region: Secondary | ICD-10-CM | POA: Insufficient documentation

## 2021-05-04 DIAGNOSIS — Z981 Arthrodesis status: Secondary | ICD-10-CM | POA: Insufficient documentation

## 2021-05-04 HISTORY — DX: Acute posthemorrhagic anemia: D62

## 2021-05-04 HISTORY — DX: Thrombocytopenia, unspecified: D69.6

## 2021-05-04 LAB — CBC
HCT: 31.8 % — ABNORMAL LOW (ref 36.0–46.0)
Hemoglobin: 10.4 g/dL — ABNORMAL LOW (ref 12.0–15.0)
MCH: 27.6 pg (ref 26.0–34.0)
MCHC: 32.7 g/dL (ref 30.0–36.0)
MCV: 84.4 fL (ref 80.0–100.0)
Platelets: 132 10*3/uL — ABNORMAL LOW (ref 150–400)
RBC: 3.77 MIL/uL — ABNORMAL LOW (ref 3.87–5.11)
RDW: 15.5 % (ref 11.5–15.5)
WBC: 10.4 10*3/uL (ref 4.0–10.5)
nRBC: 0.2 % (ref 0.0–0.2)

## 2021-05-04 LAB — COMPREHENSIVE METABOLIC PANEL
ALT: 127 U/L — ABNORMAL HIGH (ref 0–44)
AST: 163 U/L — ABNORMAL HIGH (ref 15–41)
Albumin: 2.6 g/dL — ABNORMAL LOW (ref 3.5–5.0)
Alkaline Phosphatase: 293 U/L — ABNORMAL HIGH (ref 38–126)
Anion gap: 6 (ref 5–15)
BUN: 9 mg/dL (ref 8–23)
CO2: 29 mmol/L (ref 22–32)
Calcium: 8.3 mg/dL — ABNORMAL LOW (ref 8.9–10.3)
Chloride: 104 mmol/L (ref 98–111)
Creatinine, Ser: 0.43 mg/dL — ABNORMAL LOW (ref 0.44–1.00)
GFR, Estimated: >60 mL/min (ref >60)
Glucose, Bld: 90 mg/dL (ref 70–99)
Potassium: 3.6 mmol/L (ref 3.5–5.1)
Sodium: 139 mmol/L (ref 135–145)
Total Bilirubin: 1.4 mg/dL — ABNORMAL HIGH (ref 0.3–1.2)
Total Protein: 5.5 g/dL — ABNORMAL LOW (ref 6.5–8.1)

## 2021-05-04 LAB — MAGNESIUM: Magnesium: 2 mg/dL (ref 1.7–2.4)

## 2021-05-04 NOTE — Progress Notes (Signed)
PROGRESS NOTE   [name]  WUJ:[id number] DOB: 04-30-1961 DOA: 04/28/2021 PCP: [name], DO   No chief complaint on file.  Level of care: Progressive  Brief Admission History:  [name] is a 60 y.o. female, reportedly with history of ITP since age 31 that has not caused her problems over her life presented for admission by Dr. Constance Haw.  She apparently had a perforated stercoral ulcer and has had a colostomy in place since February 2022.  She presented for colostomy reversal.  Her platelet counts had been normal up until recently on 04/29/2021 her platelets were noted to be 91 and postoperative on 04/30/2021 her platelets had dropped down to 10.  She is not having any active bleeding at this time and she reports no significant bruising. Pt was seen by Dr. Alvy Bimler on 6/22 and 1 unit platelets given.    Update 05/01/21.  Pt c/o abd pain and distension, STAT ordered by surgery with findings of a small pelvic hematoma 250 cc or less per radiology. Discussed with surgery no need to take to OR at this time.    Platelets down to 8.   I spoke with hematologist on-call Dr. Alvy Bimler and spoke with Dr. Constance Haw surgery and because we have no in person hematology coverage at [facility] we are transferrring patient to [facility] so that she can be seen and followed by Dr. Alvy Bimler who agreed to see patient in consultation.  Pt is being transfused 2 units platelets and 2 units PRBC.   TRH will become attending and [facility] will consult when transferred to [facility].    Assessment & Plan:   Principal Problem:   Colostomy in place [facility]) Active Problems:   GAD (generalized anxiety disorder)   Depression   Essential hypertension   Epilepsy ([place])   Insomnia   Current smoker   GERD (gastroesophageal reflux disease)   Osteopenia   Obesity (BMI 30-39.9)   Hyperlipemia   Benzodiazepine dependence ([place])   Parastomal hernia without obstruction or gangrene  Acute severe thrombocytopenia/acute anemia-patient developed  severe thrombocytopenia postoperatively/status post reversal of colostomy.  She was given 1 unit of platelet transfusion on 6/22 and additional 2 units on 05/01/2021.  She was also seen by oncology in consultation on 04/30/2021 since patient has history of ITP grandchild.  Oncology had opined that she may likely had HIT, hold heparin products were discontinued and HIT panel was ordered which is negative as well.  Due to persistent thrombocytopenia, she was transferred from [facility] to [facility] long after consultation with oncology.  Her hemoglobin also dropped to 7.6 on 05/01/2021 and She also received 2 units of PRBC transfusion on 05/01/2021.  Hemoglobin is stable and platelet improving.  No signs of bleeding.  HIT antibodies negative.  Sent a secure chat to Dr. Mohamed/oncology oncologist yesterday who mentioned that the case was not signed off to him but he looked at the labs and recommended that patient can be discharged from oncology standpoint.  She is good to be discharged from thrombocytopenia and acute anemia standpoint.  Pelvic hematoma: Due to abdominal pain and drop in hemoglobin, CT abdomen was done on 05/01/2021 which showed pelvic hematoma of 11.1 into 4.8 into 8.2 cm.  Per surgery, no indication of surgical exploration.  POD #6 s/p stomy reversal -still complains of abdominal pain but better than yesterday.  Some tenderness.  Seen by general surgery and advance to soft diet.  She is cleared from surgery for discharge.  Essential hypertension -fairly controlled.  Continue atenolol which is home dose and as needed hydralazine.  History of epilepsy - no recent seizure activity, remains stable on gabapentin.   Tobacco - she has been counseled on cessation, did not give nicotine patch in setting of severe thrombocytopenia  Postop urinary retention - foley catheter placed 05/01/21.  We will remove Foley catheter today.  Hypokalemia: Resolved.  Hypomagnesemia: We will replace.  Elevated LFTs:  Significant jump in aminotransferases and alkaline phosphatase to multiple falls.  Discussed case with Dr. Georgette Dover of general surgery who is not much concerned since patient is clinically doing well however when discussed with patient, she is very concerned about her rising LFTs and does not feel comfortable going home and would rather stay overnight for monitoring and we will recheck her LFTs in the morning.  She is already postcholecystectomy.  No indication of ordering any imaging at this point in time.  DVT prophylaxis: SCDs Code Status: Full  Family Communication: plan of care discussed with patient at bedside, verbalized understanding Disposition: TBD Status is: Inpatient  Remains inpatient appropriate because:IV treatments appropriate due to intensity of illness or inability to take PO and Inpatient level of care appropriate due to severity of illness  Dispo: The patient is from: Home              Anticipated d/c is to: Home              Patient currently is not medically stable to d/c.   Difficult to place patient No   Consultants:  Hematology / oncology  General surgery  Procedures:  Colostomy reversal 04/30/21   Antimicrobials:  N/a   Subjective: Seen and examined.  Still complains of abdominal pain but improving.  Had large bowel movement today.  Tolerating diet.  No other complaint.  Objective: Vitals:   05/03/21 0500 05/03/21 1438 05/03/21 2041 05/04/21 0510  BP:  133/80 125/77 140/88  Pulse:  82 70 80  Resp:  18 18 20   Temp:  98.4 F (36.9 C) 98.6 F (37 C) 98.1 F (36.7 C)  TempSrc:  Oral Oral Oral  SpO2:  96% 97% 97%  Weight: 81.8 kg     Height:        Intake/Output Summary (Last 24 hours) at 05/04/2021 1052 Last data filed at 05/04/2021 0900 Gross per 24 hour  Intake 1625.66 ml  Output 1300 ml  Net 325.66 ml    Filed Weights   05/01/21 0500 05/02/21 0500 05/03/21 0500  Weight: 81 kg 81.2 kg 81.8 kg    Examination:  General exam: Appears calm and  comfortable  Respiratory system: Clear to auscultation. Respiratory effort normal. Cardiovascular system: S1 & S2 heard, RRR. No JVD, murmurs, rubs, gallops or clicks. No pedal edema. Gastrointestinal system: Abdomen is mildly distended, slightly firm and generalized tender, no organomegaly or masses felt. Normal bowel sounds heard. Central nervous system: Alert and oriented. No focal neurological deficits. Extremities: Symmetric 5 x 5 power. Skin: No rashes, lesions or ulcers.  Psychiatry: Judgement and insight appear normal. Mood & affect appropriate.    Data Reviewed: I have personally reviewed following labs and imaging studies  CBC: Recent Labs  Lab 04/29/21 0532 04/30/21 0627 04/30/21 0922 05/01/21 0541 05/01/21 1612 05/02/21 0500 05/03/21 0350 05/04/21 0500  WBC 16.2* 13.2* 14.2* 10.1 14.9* 11.1* 11.0* 10.4  NEUTROABS 11.6* 8.8* 9.7* 6.2 9.5*  --   --   --   HGB 12.1 9.7* 9.2* 7.6* 11.4* 10.6* 10.7* 10.4*  HCT 37.7 30.8*  28.2* 23.3* 33.7* 30.4* 31.7* 31.8*  MCV 84.5 86.0 84.9 85.0 84.3 82.6 83.2 84.4  PLT 91* 10* 10*  10* 8* 41* 55* 103* 132*     Basic Metabolic Panel: Recent Labs  Lab 04/29/21 0532 04/30/21 0440 04/30/21 0922 05/01/21 0541 05/02/21 0500 05/03/21 0350 05/04/21 0500  NA 134* 134* 134* 135 140 138 139  K 3.9 3.5 3.7 3.4* 3.2* 3.6 3.6  CL 99 96* 96* 98 102 102 104  CO2 28 28 32 32 32 31 29  GLUCOSE 138* 116* 128* 98 93 99 90  BUN 9 8 9 8 8 8 9   CREATININE 0.61 0.64 0.63 0.51 0.55 0.54 0.43*  CALCIUM 8.4* 8.2* 8.2* 7.6* 8.4* 8.4* 8.3*  MG 1.9 2.1  --   --   --  1.6* 2.0  PHOS 2.7 1.8*  --   --   --   --   --      GFR: Estimated Creatinine Clearance: 73.2 mL/min (A) (by C-G formula based on SCr of 0.43 mg/dL (L)).  Liver Function Tests: Recent Labs  Lab 04/30/21 0922 05/01/21 0541 05/02/21 0500 05/03/21 0350 05/04/21 0500  AST 20 20 16  51* 163*  ALT 25 20 17 31  127*  ALKPHOS 78 66 70 138* 293*  BILITOT 0.5 0.7 0.7 0.8 1.4*   PROT 5.7* 5.3* 5.5* 5.6* 5.5*  ALBUMIN 2.8* 2.5* 2.7* 2.6* 2.6*     CBG: Recent Labs  Lab 04/29/21 1112  GLUCAP 134*     Recent Results (from the past 240 hour(s))  SARS CORONAVIRUS 2 (TAT 6-24 HRS) Nasopharyngeal Nasopharyngeal Swab     Status: None   Collection Time: 04/25/21  1:44 PM   Specimen: Nasopharyngeal Swab  Result Value Ref Range Status   SARS Coronavirus 2 NEGATIVE NEGATIVE Final    Comment: (NOTE) SARS-CoV-2 target nucleic acids are NOT DETECTED.  The SARS-CoV-2 RNA is generally detectable in upper and lower respiratory specimens during the acute phase of infection. Negative results do not preclude SARS-CoV-2 infection, do not rule out co-infections with other pathogens, and should not be used as the sole basis for treatment or other patient management decisions. Negative results must be combined with clinical observations, patient history, and epidemiological information. The expected result is Negative.  Fact Sheet for Patients: SugarRoll.be  Fact Sheet for Healthcare Providers: https://www.woods-mathews.com/  This test is not yet approved or cleared by the [place] FDA and  has been authorized for detection and/or diagnosis of SARS-CoV-2 by FDA under an Emergency Use Authorization (EUA). This EUA will remain  in effect (meaning this test can be used) for the duration of the COVID-19 declaration under Se ction 564(b)(1) of the Act, 21 U.S.C. section 360bbb-3(b)(1), unless the authorization is terminated or revoked sooner.  Performed at [facility], [place] [address] [place], [place] [address]   MRSA Next Gen by PCR, Nasal     Status: None   Collection Time: 04/30/21  2:44 PM   Specimen: Nasal Mucosa; Nasal Swab  Result Value Ref Range Status   MRSA by PCR Next Gen NOT DETECTED NOT DETECTED Final    Comment: (NOTE) The GeneXpert MRSA Assay (FDA approved for NASAL specimens only), is one component  of a comprehensive MRSA colonization surveillance program. It is not intended to diagnose MRSA infection nor to guide or monitor treatment for MRSA infections. Test performance is not FDA approved in patients less than 22 years old. Performed at [facility], [address] [place],  [place] [address]      Radiology Studies: No results found.  Scheduled Meds:  sodium chloride   Intravenous Once   sodium chloride   Intravenous Once   acetaminophen  1,000 mg Oral Q6H   amitriptyline  100 mg Oral BID   atenolol  50 mg Oral Daily   busPIRone  15 mg Oral TID   Chlorhexidine Gluconate Cloth  6 each Topical Daily   citalopram  40 mg Oral Q breakfast   feeding supplement  237 mL Oral BID BM   gabapentin  300 mg Oral BID   latanoprost  1 drop Both Eyes QHS   loratadine  10 mg Oral Daily   oxyCODONE  10 mg Oral Q6H   pantoprazole  40 mg Oral Daily   saccharomyces boulardii  250 mg Oral BID   traZODone  150 mg Oral QHS   Continuous Infusions:  lactated ringers 50 mL/hr at 05/04/21 0408    LOS: 6 days   Time spent: 29 mins   Darliss Cheney, MD How to contact the [facility] Attending or Consulting provider [place] or covering provider during after hours [place], for this patient?  Check the care team in [facility] and look for a) attending/consulting [facility] provider listed and b) the [facility] team listed Log into [email or web address] and use [place]'s universal password to access. If you do not have the password, please contact the hospital operator. Locate the [facility] provider you are looking for under [facility] and page to a number that you can be directly reached. If you still have difficulty reaching the provider, please page the [facility] (Director on Call) for the Hospitalists listed on amion for assistance.  05/04/2021, 10:52 AM

## 2021-05-04 NOTE — Evaluation (Signed)
Occupational Therapy Evaluation Patient Details Name: [name] MRN: [id number] DOB: 03/05/1961 Today's Date: 05/04/2021    History of Present Illness [name]  is a 60 y.o. female, reportedly with history of ITP since age 44 .Presented for admission 04/28/2021  for colostomy takedown.Has had a colostomy in place since February 2022.  Postoperative on 04/30/2021 her platelets had dropped .   Clinical Impression   Patient evaluated by Occupational Therapy with no further acute OT needs identified. All education has been completed and the patient has no further questions.  See below for any follow-up Occupational Therapy or equipment needs. OT is signing off. Thank you for this referral.     Follow Up Recommendations  Home health OT    Equipment Recommendations  Tub/shower seat;Other (comment) (Long handled adaptive equipment)    Recommendations for Other Services       Precautions / Restrictions Precautions Precautions: Fall Required Braces or Orthoses: Other Brace Other Brace: Abdominal binder-now in room Restrictions Weight Bearing Restrictions: No      Mobility Bed Mobility Overal bed mobility: Modified Independent             General bed mobility comments: Pt able to intiiate log roll without cues. Performed rolling and sidelying to sit Mod I with use of rails.    Transfers Overall transfer level: Needs assistance Equipment used: None Transfers: Sit to/from Stand Sit to Stand: Supervision         General transfer comment: guardeing abdomen, stands without assistance    Balance Overall balance assessment: No apparent balance deficits (not formally assessed)                                         ADL either performed or assessed with clinical judgement   ADL Overall ADL's : Needs assistance/impaired Eating/Feeding: Independent Eating/Feeding Details (indicate cue type and reason): On soft diet Grooming: Wash/dry  hands;Standing;Modified independent   Upper Body Bathing: Standing;Supervision/ safety Upper Body Bathing Details (indicate cue type and reason): sponge at sink Lower Body Bathing: Min guard;Sitting/lateral leans;Sit to/from stand   Upper Body Dressing : Set up;Maximal assistance Upper Body Dressing Details (indicate cue type and reason): Max Assist to don abdominal binder. Otherwise setup Lower Body Dressing: Moderate assistance Lower Body Dressing Details (indicate cue type and reason): Pt unable to perform figure 4 in sitting. Pt educated on reacher and other long handled AE to assist with LB ADLs at home. Pt edcuated on how to use AE for LB dressing and bathing and verbalized understanding. Toilet Transfer: Regular Clinical cytogeneticist;Ambulation;Supervision/safety Toilet Transfer Details (indicate cue type and reason): Pt able to ambulate into bathroom without AD and performed toilet t/f with use of grab bar with supervision.  Pt keeps BSC on top of her toilet at home to elevate. Toileting- Clothing Manipulation and Hygiene: Supervision/safety Toileting - Clothing Manipulation Details (indicate cue type and reason): Pt reported that she performed her own peri care earlier in day and demonstrated simulated posterior peri hygiene with supervision.     Functional mobility during ADLs: Supervision/safety;Cueing for safety       Vision   Vision Assessment?: No apparent visual deficits     Perception     Praxis      Pertinent Vitals/Pain Pain Assessment: No/denies pain Pain Location: Pt denies pain and stated that she received pain medication earlier.     Hand Dominance Right  Extremity/Trunk Assessment Upper Extremity Assessment Upper Extremity Assessment: Overall WFL for tasks assessed   Lower Extremity Assessment Lower Extremity Assessment: Overall WFL for tasks assessed   Cervical / Trunk Assessment Cervical / Trunk Assessment: Other exceptions Cervical / Trunk Exceptions: Pt leans  forward in standing initially to guard abdomen with Abd binder in place. Pt eventually straightened up for in-room ambulation.   Communication Communication Communication: No difficulties   Cognition Arousal/Alertness: Awake/alert Behavior During Therapy: WFL for tasks assessed/performed Overall Cognitive Status: Within Functional Limits for tasks assessed                                 General Comments: Easily distracted. Pt went to her phone to make note of AE/DME recommendations, but did not perform task, and became continuously distracted by phone despite cues back to task. Eventually OT ended task to complete evaluation.   General Comments       Exercises     Shoulder Instructions      Home Living Family/patient expects to be discharged to:: Private residence Living Arrangements: Other relatives (Twin sister and pt's husband who works 9-10 hours a day) Available Help at Discharge: Family;Available 24 hours/day Type of Home: Mobile home Home Access: Stairs to enter Entrance Stairs-Number of Steps: 4 Entrance Stairs-Rails: Can reach both Home Layout: One level     Bathroom Shower/Tub: Teacher, early years/pre: Standard Bathroom Accessibility: Yes   Home Equipment: Bedside commode;Walker - 2 wheels          Prior Functioning/Environment Level of Independence: Needs assistance    ADL's / Homemaking Assistance Needed: Pt reports that at baseline her sister assists with bathing, shower transfers for safety and with dressing.  Pt's husband performs all IADLs as pt's sister has a "bad back".   Comments: Hydrographic surveyor, drives, on disability        OT Problem List: Pain;Decreased activity tolerance      OT Treatment/Interventions:      OT Goals(Current goals can be found in the care plan section) Acute Rehab OT Goals Patient Stated Goal: home, OT Goal Formulation: With patient/family Potential to Achieve Goals: Good  OT Frequency:      Barriers to D/C:            Co-evaluation              AM-PAC OT "6 Clicks" Daily Activity     Outcome Measure Help from another person eating meals?: None Help from another person taking care of personal grooming?: None Help from another person toileting, which includes using toliet, bedpan, or urinal?: A Little Help from another person bathing (including washing, rinsing, drying)?: A Little Help from another person to put on and taking off regular upper body clothing?: None Help from another person to put on and taking off regular lower body clothing?: A Lot 6 Click Score: 20   End of Session Equipment Utilized During Treatment: Other (comment) (ABD Binder)  Activity Tolerance: Patient tolerated treatment well Patient left: with call bell/phone within reach;with family/visitor present (Seated on window bench with lunch tray setup anterior to pt)  OT Visit Diagnosis: Pain Pain - part of body:  (ABD)                Time: 6948-5462 OT Time Calculation (min): 19 min Charges:  OT General Charges $OT Visit: 1 Visit OT Evaluation $OT Eval Low Complexity: 1 Low  [name],  OT Acute Rehab Services Office: [phone] 05/04/2021  [name] 05/04/2021, 12:53 PM

## 2021-05-04 NOTE — Progress Notes (Signed)
6 Days Post-Op   Subjective/Chief Complaint: Large BM yesterday Tolerating diet Abd sore   Objective: Vital signs in last 24 hours: Temp:  [98.1 F (36.7 C)-98.6 F (37 C)] 98.1 F (36.7 C) (06/26 0510) Pulse Rate:  [70-82] 80 (06/26 0510) Resp:  [18-20] 20 (06/26 0510) BP: (125-140)/(77-88) 140/88 (06/26 0510) SpO2:  [96 %-97 %] 97 % (06/26 0510) Last BM Date: 05/03/21  Intake/Output from previous day: 06/25 0701 - 06/26 0700 In: 1385.7 [P.O.:240; I.V.:1145.7] Out: 1300 [Urine:1300] Intake/Output this shift: No intake/output data recorded.  WDWN in NAD Abd - soft, incisional tenderness Dressings removed - staple line c/d/I  Lab Results:  Recent Labs    05/03/21 0350 05/04/21 0500  WBC 11.0* 10.4  HGB 10.7* 10.4*  HCT 31.7* 31.8*  PLT 103* 132*   BMET Recent Labs    05/03/21 0350 05/04/21 0500  NA 138 139  K 3.6 3.6  CL 102 104  CO2 31 29  GLUCOSE 99 90  BUN 8 9  CREATININE 0.54 0.43*  CALCIUM 8.4* 8.3*    Hepatic Function Latest Ref Rng & Units 05/04/2021 05/03/2021 05/02/2021  Total Protein 6.5 - 8.1 g/dL 5.5(L) 5.6(L) 5.5(L)  Albumin 3.5 - 5.0 g/dL 2.6(L) 2.6(L) 2.7(L)  AST 15 - 41 U/L 163(H) 51(H) 16  ALT 0 - 44 U/L 127(H) 31 17  Alk Phosphatase 38 - 126 U/L 293(H) 138(H) 70  Total Bilirubin 0.3 - 1.2 mg/dL 1.4(H) 0.8 0.7  Bilirubin, Direct 0.00 - 0.40 mg/dL - - -    PT/INR Recent Labs    05/01/21 1612  LABPROT 13.0  INR 1.0   ABG No results for input(s): PHART, HCO3 in the last 72 hours.  Invalid input(s): PCO2, PO2  Studies/Results: No results found.  Anti-infectives: Anti-infectives (From admission, onward)    Start     Dose/Rate Route Frequency Ordered Stop   04/29/21 1000  cefoTEtan (CEFOTAN) 1 g in sodium chloride 0.9 % 100 mL IVPB        1 g 200 mL/hr over 30 Minutes Intravenous Every 30 min 04/29/21 0855 04/29/21 1138   04/28/21 2000  cefoTEtan (CEFOTAN) 2 g in sodium chloride 0.9 % 100 mL IVPB  Status:  Discontinued         2 g 200 mL/hr over 30 Minutes Intravenous Every 12 hours 04/28/21 1608 04/29/21 0855   04/28/21 0630  cefoTEtan (CEFOTAN) 2 g in sodium chloride 0.9 % 100 mL IVPB        2 g 200 mL/hr over 30 Minutes Intravenous On call to O.R. 04/28/21 4627 04/28/21 0745       Assessment/Plan: 60 yo female s/p open colostomy reversal for stercoral ulcer in past. She had worsening thrombocytopenia with acute blood loss anemia after surgery with multiple bloody bowel movements. She responded appropriately to 2 units pRBC yesterday with Hemoglobin 11.4 this morning. Platelets have come up to 48 from 8 yesterday. -advance to soft diet -thrombocytopenia/ anemia - improving -LFT's abnormal today - no GB   From surgical standpoint, the patient appears ready for discharge She can follow-up with her primary surgeon in [place] for staple removal and perhaps recheck LFT's    LOS: 6 days    Maia Petties 05/04/2021

## 2021-05-05 ENCOUNTER — Telehealth: Payer: Self-pay

## 2021-05-05 DIAGNOSIS — D696 Thrombocytopenia, unspecified: Secondary | ICD-10-CM | POA: Diagnosis not present

## 2021-05-05 LAB — COMPREHENSIVE METABOLIC PANEL
ALT: 134 U/L — ABNORMAL HIGH (ref 0–44)
AST: 141 U/L — ABNORMAL HIGH (ref 15–41)
Albumin: 2.5 g/dL — ABNORMAL LOW (ref 3.5–5.0)
Alkaline Phosphatase: 307 U/L — ABNORMAL HIGH (ref 38–126)
Anion gap: 6 (ref 5–15)
BUN: 12 mg/dL (ref 8–23)
CO2: 30 mmol/L (ref 22–32)
Calcium: 8.4 mg/dL — ABNORMAL LOW (ref 8.9–10.3)
Chloride: 104 mmol/L (ref 98–111)
Creatinine, Ser: 0.46 mg/dL (ref 0.44–1.00)
GFR, Estimated: >60 mL/min (ref >60)
Glucose, Bld: 91 mg/dL (ref 70–99)
Potassium: 3.4 mmol/L — ABNORMAL LOW (ref 3.5–5.1)
Sodium: 140 mmol/L (ref 135–145)
Total Bilirubin: 1.3 mg/dL — ABNORMAL HIGH (ref 0.3–1.2)
Total Protein: 5.2 g/dL — ABNORMAL LOW (ref 6.5–8.1)

## 2021-05-05 LAB — CBC
HCT: 32.2 % — ABNORMAL LOW (ref 36.0–46.0)
Hemoglobin: 10.5 g/dL — ABNORMAL LOW (ref 12.0–15.0)
MCH: 27.9 pg (ref 26.0–34.0)
MCHC: 32.6 g/dL (ref 30.0–36.0)
MCV: 85.4 fL (ref 80.0–100.0)
Platelets: 202 10*3/uL (ref 150–400)
RBC: 3.77 MIL/uL — ABNORMAL LOW (ref 3.87–5.11)
RDW: 16.5 % — ABNORMAL HIGH (ref 11.5–15.5)
WBC: 11.4 10*3/uL — ABNORMAL HIGH (ref 4.0–10.5)
nRBC: 0.2 % (ref 0.0–0.2)

## 2021-05-05 LAB — SEROTONIN RELEASE ASSAY (SRA)
SRA .2 IU/mL UFH Ser-aCnc: 4 % (ref 0–20)
SRA 100IU/mL UFH Ser-aCnc: 3 % (ref 0–20)

## 2021-05-05 MED ORDER — POTASSIUM CHLORIDE CRYS ER 20 MEQ PO TBCR
40.0000 meq | EXTENDED_RELEASE_TABLET | Freq: Once | ORAL | Status: AC
  Administered 2021-05-05: 40 meq via ORAL
  Filled 2021-05-05: qty 2

## 2021-05-05 NOTE — Discharge Summary (Addendum)
Physician Discharge Summary  [name] GBT:[id number] DOB: [birth year] DOA: 04/28/2021  PCP: [name], DO  Admit date: 04/28/2021 Discharge date: 05/05/2021 30 Day Unplanned Readmission Risk Score    Flowsheet Row Admission (Current) from 04/28/2021 in [facility]  30 Day Unplanned Readmission Risk Score (%) 21.69 Filed at 05/05/2021 0801       This score is the patient's risk of an unplanned readmission within 30 days of being discharged (0 -100%). The score is based on dignosis, age, lab data, medications, orders, and past utilization.   Low:  0-14.9   Medium: 15-21.9   High: 22-29.9   Extreme: 30 and above           Admitted From: Home Disposition: Home  Recommendations for Outpatient Follow-up:  Follow up with PCP in 1-2 weeks Follow-up with general surgeon/Dr. Constance Haw on Thursday, 05/08/2021 Please obtain BMP/CBC in one week Please follow up with your PCP on the following pending results: Unresulted Labs (From admission, onward)     Start     Ordered   05/02/21 0500  Comprehensive metabolic panel  Daily,   R     Question:  Specimen collection method  Answer:  Lab=Lab collect   05/01/21 1720   04/30/21 1809  Serotonin release assay (SRA)  Once,   R       Question:  Specimen collection method  Answer:  Lab=Lab collect   04/30/21 1808   04/30/21 0500  CBC with Differential/Platelet  Tomorrow morning,   R        04/29/21 1217              Home Health: Yes Equipment/Devices: shower seat  Discharge Condition: Stable CODE STATUS: Full code Diet recommendation: Cardiac  Subjective: Seen and examined.  Very minimal abdominal pain but tolerating diet and she wants to go home today.  Brief/Interim Summary: [name] is a 60 y.o. female, reportedly with history of ITP since age 18 that has not caused her problems over her life presented for admission by Dr. Constance Haw.  She apparently had a perforated stercoral  ulcer and has had a colostomy in place since February 2022.  She presented for colostomy reversal and admitted at [facility] under general surgery.  Her platelet counts had been normal up until recently on 04/29/2021 her platelets were noted to be 91 and postoperative on 04/30/2021 her platelets had dropped down to 10. there was no active bleeding.hospitalist were consulted for medical issues and for this.  Pt was seen by Dr. Alvy Bimler on 6/22 and 1 unit platelets given.     On 05/01/21.  Pt c/o abd pain and distension, STAT CT abdomen pelvis showed small pelvic hematoma 250 cc or less per radiology.  [facility] at  [facility] discussed with surgery no need to take to OR at this time.  Her hemoglobin further dropped to 8 and since there was no oncology coverage at [facility], Dr. Constance Haw discussed case with oncology and patient was transferred to [facility] and since arrival to [facility], hospitalist became the primary service and general surgery here consulted as well as oncology consulted.  Since arrival here, patient's abdominal pain slowly improved, she was started on clear liquid diet and advance to soft diet which she is tolerating.  Ironically, her platelets also started to improve.  Initial thought of oncology was that her thrombocytopenia was likely secondary to either heat or ITP.  It panel was negative and according  to Dr. Alvy Bimler of oncology, this is likely ITP.  Now that her numbers have improved significantly, she is cleared from oncology for discharge.  For some unknown reasons, patient's LFTs rose significantly yesterday however patient continues to do well clinically.  She already has cholecystectomy done so there was no suspicion for choledocholithiasis.  She was cleared by general surgery yesterday for discharge however patient was concerned about her LFTs, rightfully so she was kept in the hospital for observation.  Her LFTs are slightly elevated again today but fairly stable.  Patient at this point in  time has expressed has desire to go home.  This is reasonable.  I have discussed with Dr. Constance Haw who is going to follow-up with this patient in the clinic on this Thursday, 05/08/2021 and will arrange basic labs including CMP and CBC before patient will see her.  Per oncology, patient does not need to follow-up with them long-term.  Pelvic hematoma: Per surgery, no indication of surgical exploration.   POD #7 s/p stomy reversal -minimal abdominal pain, minimal abdominal tenderness but tolerating diet.   Essential hypertension -controlled, resume home medications.  Hypokalemia: 3.4 today.  She will receive 40 mg of potassium chloride p.o. today before discharge.  Discharge Diagnoses:  Principal Problem:   Acute Thrombocytopenia - HIT negative Active Problems:   GAD (generalized anxiety disorder)   Depression   Essential hypertension   Epilepsy (HCC)   Insomnia   Smoker   Constipation   GERD (gastroesophageal reflux disease)   Osteopenia   Obesity (BMI 30-39.9)   Hyperlipidemia   Benzodiazepine dependence ([place])   Under care of pain management specialist   Colostomy in place [facility])   Parastomal hernia without obstruction or gangrene   Acute blood loss anemia ([facility]LA)    Discharge Instructions   Allergies as of 05/05/2021       Reactions   Nsaids Nausea Only   Abdominal pain        Medication List     STOP taking these medications    ibuprofen 800 MG tablet Commonly known as: ADVIL   rosuvastatin 20 MG tablet Commonly known as: Crestor       TAKE these medications    amitriptyline 25 MG tablet Commonly known as: ELAVIL Take 25-50 mg by mouth See admin instructions. Take 50 mg by mouth at bedtime, may take 25 mg during the day if needed for pain   atenolol 50 MG tablet Commonly known as: TENORMIN Take 1 tablet (50 mg total) by mouth daily.   busPIRone 15 MG tablet Commonly known as: BUSPAR Take 1 tablet (15 mg total) by mouth 3 (three) times daily.    cetirizine 10 MG tablet Commonly known as: ZYRTEC Take 10 mg by mouth daily.   citalopram 40 MG tablet Commonly known as: CELEXA Take 1 tablet (40 mg total) by mouth daily with breakfast.   famotidine 20 MG tablet Commonly known as: PEPCID Take 1 tablet (20 mg total) by mouth daily as needed for heartburn or indigestion. What changed: when to take this   latanoprost 0.005 % ophthalmic solution Commonly known as: XALATAN Place 1 drop into both eyes at bedtime.   lisinopril-hydrochlorothiazide 20-25 MG tablet Commonly known as: ZESTORETIC TAKE 1 TABLET BY MOUTH EVERY DAY   MIRALAX PO Take 17 g by mouth in the morning and at bedtime.   nitroGLYCERIN 0.4 MG SL tablet Commonly known as: NITROSTAT Place 1 tablet (0.4 mg total) under the tongue every 5 (five) minutes as needed  for chest pain.   ondansetron 4 MG tablet Commonly known as: ZOFRAN Take 4 mg by mouth every 8 (eight) hours as needed for nausea.   Oxycodone HCl 10 MG Tabs Take 1 tablet (10 mg total) by mouth 2 (two) times daily. What changed: when to take this   tiZANidine 4 MG tablet Commonly known as: ZANAFLEX Take 4 mg by mouth every 8 (eight) hours as needed for muscle spasms.   traZODone 100 MG tablet Commonly known as: DESYREL TAKE 1 AND 1/2 TABLET (150 MG TOTAL) BY MOUTH AT BEDTIME AS NEEDED FOR SLEEP. What changed: See the new instructions.               Durable Medical Equipment  (From admission, onward)           Start     Ordered   05/05/21 0740  For home use only DME Shower stool  Once        05/05/21 0740            Follow-up Information     [name], DO Follow up in 1 week(s).   Specialty: Family Medicine Contact information: [place] [address] [phone]         Arnoldo Lenis, MD .   Specialty: Cardiology Contact information: [place] [address] [phone]                Allergies   Allergen Reactions   Nsaids Nausea Only    Abdominal pain    Consultations: General surgery and oncology   Procedures/Studies: CT ABDOMEN PELVIS WO CONTRAST  Result Date: 05/01/2021 CLINICAL DATA:  Post colostomy reversal, postoperative anemia, fall in hemoglobin from 12-7 g EXAM: CT ABDOMEN AND PELVIS WITHOUT CONTRAST TECHNIQUE: Multidetector CT imaging of the abdomen and pelvis was performed following the standard protocol without IV contrast. Sagittal and coronal MPR images reconstructed from axial data set. No oral contrast administered. COMPARISON:  01/15/2021 FINDINGS: Lower chest: Bibasilar atelectasis Hepatobiliary: Gallbladder surgically absent.  Liver unremarkable. Pancreas: Atrophic, otherwise normal appearance Spleen: Normal appearance Adrenals/Urinary Tract: Adrenal glands, kidneys, and ureters normal appearance. Bladder decompressed by Foley catheter. Stomach/Bowel: Post colostomy reversal with anastomotic staple line in sigmoid colon. Prominent small bowel loops consistent with postoperative ileus. Normal appendix. Stomach unremarkable. Vascular/Lymphatic: Minimal atherosclerotic calcification aorta. Aorta normal caliber. No adenopathy. Reproductive: Uterus surgically absent with nonvisualization of ovaries Other: Scattered free air consistent with recent surgery. Small fluid collection at the colostomy takedown site, low-attenuation, 7.4 x 2.7 x 4.5 cm image 58. Minimal free fluid. High attenuation collection identified within the pelvis, 11.1 x 4.8 x 8.2 cm consistent with hematoma. No hernia. Musculoskeletal: Prior lumbar fusion L4-S1. Osseous structures otherwise unremarkable. IMPRESSION: Post colostomy reversal with anastomotic staple line in sigmoid colon. Prominent small bowel loops consistent with postoperative ileus. High attenuation collection within the pelvis 11.1 x 4.8 x 8.2 cm consistent with hematoma. Small fluid collection at the colostomy takedown site 7.4 x 2.7 cm, could  represent a sterile or infected postoperative collection. Bibasilar atelectasis. Aortic Atherosclerosis (ICD10-I70.0). Electronically Signed   By: Lavonia Dana M.D.   On: 05/01/2021 11:44     Discharge Exam: Vitals:   05/04/21 2015 05/05/21 0434  BP: (!) 145/88 (!) 142/83  Pulse: 88 83  Resp: 18 17  Temp: 98.2 F (36.8 C) 98.3 F (36.8 C)  SpO2: 97% 93%   Vitals:   05/04/21 1300 05/04/21 2015 05/05/21 0434 05/05/21  0500  BP: 135/82 (!) 145/88 (!) 142/83   Pulse: 82 88 83   Resp: 18 18 17    Temp: 98.2 F (36.8 C) 98.2 F (36.8 C) 98.3 F (36.8 C)   TempSrc: Oral Oral Oral   SpO2: 97% 97% 93%   Weight:    81.8 kg  Height:        General: Pt is alert, awake, not in acute distress Cardiovascular: RRR, S1/S2 +, no rubs, no gallops Respiratory: CTA bilaterally, no wheezing, no rhonchi Abdominal: Soft, mild periumbilical tenderness, ND, bowel sounds + Extremities: no edema, no cyanosis    The results of significant diagnostics from this hospitalization (including imaging, microbiology, ancillary and laboratory) are listed below for reference.     Microbiology: Recent Results (from the past 240 hour(s))  SARS CORONAVIRUS 2 (TAT 6-24 HRS) Nasopharyngeal Nasopharyngeal Swab     Status: None   Collection Time: 04/25/21  1:44 PM   Specimen: Nasopharyngeal Swab  Result Value Ref Range Status   SARS Coronavirus 2 NEGATIVE NEGATIVE Final    Comment: (NOTE) SARS-CoV-2 target nucleic acids are NOT DETECTED.  The SARS-CoV-2 RNA is generally detectable in upper and lower respiratory specimens during the acute phase of infection. Negative results do not preclude SARS-CoV-2 infection, do not rule out co-infections with other pathogens, and should not be used as the sole basis for treatment or other patient management decisions. Negative results must be combined with clinical observations, patient history, and epidemiological information. The expected result is Negative.  Fact  Sheet for Patients: SugarRoll.be  Fact Sheet for Healthcare Providers: https://www.woods-mathews.com/  This test is not yet approved or cleared by the [place] FDA and  has been authorized for detection and/or diagnosis of SARS-CoV-2 by FDA under an Emergency Use Authorization (EUA). This EUA will remain  in effect (meaning this test can be used) for the duration of the COVID-19 declaration under Se ction 564(b)(1) of the Act, 21 U.S.C. section 360bbb-3(b)(1), unless the authorization is terminated or revoked sooner.  Performed at [facility], [place] [address] [place], [place] [address]   MRSA Next Gen by PCR, Nasal     Status: None   Collection Time: 04/30/21  2:44 PM   Specimen: Nasal Mucosa; Nasal Swab  Result Value Ref Range Status   MRSA by PCR Next Gen NOT DETECTED NOT DETECTED Final    Comment: (NOTE) The GeneXpert MRSA Assay (FDA approved for NASAL specimens only), is one component of a comprehensive MRSA colonization surveillance program. It is not intended to diagnose MRSA infection nor to guide or monitor treatment for MRSA infections. Test performance is not FDA approved in patients less than 4 years old. Performed at [facility], [address] [place], [place] [address]      Labs: BNP (last 3 results) No results for input(s): BNP in the last 8760 hours. Basic Metabolic Panel: Recent Labs  Lab 04/29/21 0532 04/30/21 0440 04/30/21 0922 05/01/21 0541 05/02/21 0500 05/03/21 0350 05/04/21 0500 05/05/21 0350  NA 134* 134*   < > 135 140 138 139 140  K 3.9 3.5   < > 3.4* 3.2* 3.6 3.6 3.4*  CL 99 96*   < > 98 102 102 104 104  CO2 28 28   < > 32 32 31 29 30   GLUCOSE 138* 116*   < > 98 93 99 90 91  BUN 9 8   < > 8 8 8 9 12   CREATININE 0.61 0.64   < >  0.51 0.55 0.54 0.43* 0.46  CALCIUM 8.4* 8.2*   < > 7.6* 8.4* 8.4* 8.3* 8.4*  MG 1.9 2.1  --   --   --  1.6* 2.0  --   PHOS 2.7 1.8*  --   --   --   --   --    --    < > = values in this interval not displayed.   Liver Function Tests: Recent Labs  Lab 05/01/21 0541 05/02/21 0500 05/03/21 0350 05/04/21 0500 05/05/21 0350  AST 20 16 51* 163* 141*  ALT 20 17 31  127* 134*  ALKPHOS 66 70 138* 293* 307*  BILITOT 0.7 0.7 0.8 1.4* 1.3*  PROT 5.3* 5.5* 5.6* 5.5* 5.2*  ALBUMIN 2.5* 2.7* 2.6* 2.6* 2.5*   No results for input(s): LIPASE, AMYLASE in the last 168 hours. No results for input(s): AMMONIA in the last 168 hours. CBC: Recent Labs  Lab 04/29/21 0532 04/30/21 0627 04/30/21 0922 05/01/21 0541 05/01/21 1612 05/02/21 0500 05/03/21 0350 05/04/21 0500 05/05/21 0350  WBC 16.2* 13.2* 14.2* 10.1 14.9* 11.1* 11.0* 10.4 11.4*  NEUTROABS 11.6* 8.8* 9.7* 6.2 9.5*  --   --   --   --   HGB 12.1 9.7* 9.2* 7.6* 11.4* 10.6* 10.7* 10.4* 10.5*  HCT 37.7 30.8* 28.2* 23.3* 33.7* 30.4* 31.7* 31.8* 32.2*  MCV 84.5 86.0 84.9 85.0 84.3 82.6 83.2 84.4 85.4  PLT 91* 10* 10*  10* 8* 41* 55* 103* 132* 202   Cardiac Enzymes: No results for input(s): CKTOTAL, CKMB, CKMBINDEX, TROPONINI in the last 168 hours. BNP: Invalid input(s): POCBNP CBG: Recent Labs  Lab 04/29/21 1112  GLUCAP 134*   D-Dimer No results for input(s): DDIMER in the last 72 hours. Hgb A1c No results for input(s): HGBA1C in the last 72 hours. Lipid Profile No results for input(s): CHOL, HDL, LDLCALC, TRIG, CHOLHDL, LDLDIRECT in the last 72 hours. Thyroid function studies No results for input(s): TSH, T4TOTAL, T3FREE, THYROIDAB in the last 72 hours.  Invalid input(s): FREET3 Anemia work up No results for input(s): VITAMINB12, FOLATE, FERRITIN, TIBC, IRON, RETICCTPCT in the last 72 hours. Urinalysis    Component Value Date/Time   COLORURINE YELLOW 12/31/2020 0500   APPEARANCEUR Clear 01/16/2021 1445   LABSPEC 1.034 (H) 12/31/2020 0500   PHURINE 8.0 12/31/2020 0500   GLUCOSEU Negative 01/16/2021 1445   GLUCOSEU NEGATIVE 09/21/2017 0932   HGBUR NEGATIVE 12/31/2020 0500    BILIRUBINUR Negative 01/16/2021 1445   KETONESUR NEGATIVE 12/31/2020 0500   PROTEINUR Negative 01/16/2021 1445   PROTEINUR NEGATIVE 12/31/2020 0500   UROBILINOGEN 0.2 09/21/2017 0932   NITRITE Negative 01/16/2021 1445   NITRITE NEGATIVE 12/31/2020 0500   LEUKOCYTESUR Negative 01/16/2021 1445   LEUKOCYTESUR NEGATIVE 12/31/2020 0500   Sepsis Labs Invalid input(s): PROCALCITONIN,  WBC,  LACTICIDVEN Microbiology Recent Results (from the past 240 hour(s))  SARS CORONAVIRUS 2 (TAT 6-24 HRS) Nasopharyngeal Nasopharyngeal Swab     Status: None   Collection Time: 04/25/21  1:44 PM   Specimen: Nasopharyngeal Swab  Result Value Ref Range Status   SARS Coronavirus 2 NEGATIVE NEGATIVE Final    Comment: (NOTE) SARS-CoV-2 target nucleic acids are NOT DETECTED.  The SARS-CoV-2 RNA is generally detectable in upper and lower respiratory specimens during the acute phase of infection. Negative results do not preclude SARS-CoV-2 infection, do not rule out co-infections with other pathogens, and should not be used as the sole basis for treatment or other patient management decisions. Negative results must be combined with clinical  observations, patient history, and epidemiological information. The expected result is Negative.  Fact Sheet for Patients: SugarRoll.be  Fact Sheet for Healthcare Providers: https://www.woods-mathews.com/  This test is not yet approved or cleared by the [place] FDA and  has been authorized for detection and/or diagnosis of SARS-CoV-2 by FDA under an Emergency Use Authorization (EUA). This EUA will remain  in effect (meaning this test can be used) for the duration of the COVID-19 declaration under Se ction 564(b)(1) of the Act, 21 U.S.C. section 360bbb-3(b)(1), unless the authorization is terminated or revoked sooner.  Performed at [facility], [place] [address] [place], [place] [address]   MRSA Next Gen by PCR,  Nasal     Status: None   Collection Time: 04/30/21  2:44 PM   Specimen: Nasal Mucosa; Nasal Swab  Result Value Ref Range Status   MRSA by PCR Next Gen NOT DETECTED NOT DETECTED Final    Comment: (NOTE) The GeneXpert MRSA Assay (FDA approved for NASAL specimens only), is one component of a comprehensive MRSA colonization surveillance program. It is not intended to diagnose MRSA infection nor to guide or monitor treatment for MRSA infections. Test performance is not FDA approved in patients less than 81 years old. Performed at [facility], [address], [place], [place] [address]      Time coordinating discharge: Over 30 minutes  SIGNED:   Darliss Cheney, MD  [facility] 05/05/2021, 9:54 AM  If 7PM-7AM, please contact night-coverage [email or web address]

## 2021-05-05 NOTE — Telephone Encounter (Signed)
Contact Date: 05/05/2021 Contacted By: Felicity Coyer, LPN  Transition Care Management Follow-up Telephone Call Date of discharge and from where: 05/05/2021 [facility] Discharge Diagnosis:  Acute Thrombocytopenia How have you been since you were released from the hospital? Feels fine Any questions or concerns? No   Items Reviewed: Did the pt receive and understand the discharge instructions provided? Yes  Medications obtained and verified? Yes  Any new allergies since your discharge? No  Dietary orders reviewed? Yes Do you have support at home? Yes   Discontinued Medications Ibuprofen, Rosuvastatin  New Medications Added None  Current Medication List Allergies as of 05/05/2021       Reactions   Nsaids Nausea Only   Abdominal pain        Medication List        Accurate as of May 05, 2021 11:20 AM. If you have any questions, ask your nurse or doctor.          amitriptyline 25 MG tablet Commonly known as: ELAVIL Take 25-50 mg by mouth See admin instructions. Take 50 mg by mouth at bedtime, may take 25 mg during the day if needed for pain   atenolol 50 MG tablet Commonly known as: TENORMIN Take 1 tablet (50 mg total) by mouth daily.   busPIRone 15 MG tablet Commonly known as: BUSPAR Take 1 tablet (15 mg total) by mouth 3 (three) times daily.   cetirizine 10 MG tablet Commonly known as: ZYRTEC Take 10 mg by mouth daily.   citalopram 40 MG tablet Commonly known as: CELEXA Take 1 tablet (40 mg total) by mouth daily with breakfast.   famotidine 20 MG tablet Commonly known as: PEPCID Take 1 tablet (20 mg total) by mouth daily as needed for heartburn or indigestion.   latanoprost 0.005 % ophthalmic solution Commonly known as: XALATAN Place 1 drop into both eyes at bedtime.   lisinopril-hydrochlorothiazide 20-25 MG tablet Commonly known as: ZESTORETIC TAKE 1 TABLET BY MOUTH EVERY DAY   MIRALAX PO Take 17 g by mouth in the morning and at bedtime.    nitroGLYCERIN 0.4 MG SL tablet Commonly known as: NITROSTAT Place 1 tablet (0.4 mg total) under the tongue every 5 (five) minutes as needed for chest pain.   ondansetron 4 MG tablet Commonly known as: ZOFRAN Take 4 mg by mouth every 8 (eight) hours as needed for nausea.   Oxycodone HCl 10 MG Tabs Take 1 tablet (10 mg total) by mouth 2 (two) times daily.   tiZANidine 4 MG tablet Commonly known as: ZANAFLEX Take 4 mg by mouth every 8 (eight) hours as needed for muscle spasms.   traZODone 100 MG tablet Commonly known as: DESYREL TAKE 1 AND 1/2 TABLET (150 MG TOTAL) BY MOUTH AT BEDTIME AS NEEDED FOR SLEEP.         Home Care and Equipment/Supplies: Were home health services ordered? no If so, what is the name of the agency? NA  Has the agency set up a time to come to the patient's home? not applicable Were any new equipment or medical supplies ordered?  Yes: shower chair What is the name of the medical supply agency? none Were you able to get the supplies/equipment? yes Do you have any questions related to the use of the equipment or supplies? No  Functional Questionnaire: (I = Independent and D = Dependent) ADLs: Independent  Bathing/Dressing- Independent  Meal Prep- Independent  Eating- Independent  Maintaining continence- Independent  Transferring/Ambulation- Independent  Managing Meds- Independent  Follow  up appointments reviewed:  PCP Hospital f/u appt confirmed? Yes  Scheduled to see Dr. Lajuana Ripple on 05/20/2021 @ 3:15 pm. [facility] f/u appt confirmed? Yes  Scheduled to see Dr. Constance Haw on 05/08/21 @ 3:30 pm. Are transportation arrangements needed? No  If their condition worsens, is the pt aware to call PCP or go to the Emergency Dept.? Yes Was the patient provided with contact information for the PCP's office or ED? Yes Was to pt encouraged to call back with questions or concerns? Yes

## 2021-05-05 NOTE — TOC Transition Note (Signed)
Transition of Care [facility]) - CM/SW Discharge Note   Patient Details  Name: [name] MRN: [id number] Date of Birth: [birth year]  [facility] of [facility] CM/SW Contact:  Dessa Phi, RN Phone Number: 05/05/2021, 11:38 AM   Clinical Narrative:  After d/c note:patient has d/c prior to HHC,dme being setup-attempted to contact patient in rm-no answer/called spouse [name]-no answer;[phone] to leave message on home phone-[phone];attempted to contact nurse-Katie no answer.[facility] accepted for HHPT/OT-will try to reach patient throughout the day. Ordered for shower stool-will also contact patient if they want to accept shower stool prior contacting [facility] they can ship to patients home if patient agree.     Final next level of care: [employer] Barriers to Discharge: No Barriers Identified   Patient Goals and CMS Choice Patient states their goals for this hospitalization and ongoing recovery are:: go home CMS Medicare.gov Compare Post Acute Care list provided to:: Patient    Discharge Placement                       Discharge Plan and Services   Discharge Planning Services: CM Consult Post Acute Care Choice: Home Health                               Social Determinants of Health (SDOH) Interventions     Readmission Risk Interventions Readmission Risk Prevention Plan 05/02/2021 01/02/2021  Transportation Screening Complete Complete  PCP or Specialist Appt within 3-5 Days Complete -  HRI or [place] Complete Complete  Social Work Consult for [place] Planning/Counseling Complete Complete  Palliative Care Screening Not Applicable Not Applicable  Medication Review [employer]) Complete Complete  Some recent data might be hidden

## 2021-05-05 NOTE — Progress Notes (Addendum)
HEMATOLOGY-ONCOLOGY PROGRESS NOTE I have seen her, examined her and agree with documentation as follows  ASSESSMENT AND PLAN: Severe, acute thrombocytopenia, concern for heparin induced thrombocytopenia/history of childhood ITP She has no clinical signs of thrombosis and overall this is suspicious for HIT, nonimmune mediated type I HIT panel negative and SRA pending Status post 1 unit of platelets on 6/22 and 2 additional units of platelets on 6/23 with normalization of her platelet count Recommend platelet transfusion support to keep platelet count above 10,000 Avoid heparin containing products and NSAIDs There is no need to consider anticoagulation therapy at this time Acute ITP cannot be excluded although thought to be rare in this clinical setting Since her platelet count has resolved, she does not need long-term follow-up  S/p colostomy reversal surgery/postop pelvic hematoma Transfuse platelets as noted above General surgery following Overall, it is best not to place a drain due to her platelet issue  Acute on chronic back pain Pain is improved at this time with oxycodone and increase in amitriptyline dose  Mild postop anemia Status post 2 units PRBCs 6/23 with improvement of hemoglobin Monitor hemoglobin closely secondary to postop pelvic hematoma  Discharge planning Platelet count has normalized and hemoglobin remained stable She may be discharged from our standpoint No need for long-term hematology follow-up. I will sign off  Heath Lark, MD  SUBJECTIVE: The patient reports that she feels much better today Pain is well controlled at this time She has not seen any bleeding Bowels are moving  REVIEW OF SYSTEMS:   Constitutional: Denies fevers, chills  Eyes: Denies blurriness of vision Ears, nose, mouth, throat, and face: Denies mucositis or sore throat Respiratory: Denies cough, dyspnea or wheezes Cardiovascular: Denies palpitation, chest discomfort Abdomen:  Abdominal pain improved, bowels moving, no nausea or vomiting Skin: Denies abnormal skin rashes Lymphatics: Denies new lymphadenopathy or easy bruising Neurological:Denies numbness, tingling or new weaknesses Behavioral/Psych: Mood is stable, no new changes  Extremities: No lower extremity edema All other systems were reviewed with the patient and are negative.  I have reviewed the past medical history, past surgical history, social history and family history with the patient and they are unchanged from previous note.   PHYSICAL EXAMINATION:  Vitals:   05/04/21 2015 05/05/21 0434  BP: (!) 145/88 (!) 142/83  Pulse: 88 83  Resp: 18 17  Temp: 98.2 F (36.8 C) 98.3 F (36.8 C)  SpO2: 97% 93%   Filed Weights   05/02/21 0500 05/03/21 0500 05/05/21 0500  Weight: 81.2 kg 81.8 kg 81.8 kg    Intake/Output from previous day: 06/26 0701 - 06/27 0700 In: 1080 [P.O.:1080] Out: -   GENERAL:alert, no distress and comfortable SKIN: skin color, texture, turgor are normal, no rashes or significant lesions ABDOMEN: Positive bowel sounds, soft, mild tenderness with palpation, staples intact.  Her incision wound is healing great NEURO: alert & oriented x 3 with fluent speech, no focal motor/sensory deficits  LABORATORY DATA:  I have reviewed the data as listed CMP Latest Ref Rng & Units 05/05/2021 05/04/2021 05/03/2021  Glucose 70 - 99 mg/dL 91 90 99  BUN 8 - 23 mg/dL 12 9 8   Creatinine 0.44 - 1.00 mg/dL 0.46 0.43(L) 0.54  Sodium 135 - 145 mmol/L 140 139 138  Potassium 3.5 - 5.1 mmol/L 3.4(L) 3.6 3.6  Chloride 98 - 111 mmol/L 104 104 102  CO2 22 - 32 mmol/L 30 29 31   Calcium 8.9 - 10.3 mg/dL 8.4(L) 8.3(L) 8.4(L)  Total Protein 6.5 - 8.1  g/dL 5.2(L) 5.5(L) 5.6(L)  Total Bilirubin 0.3 - 1.2 mg/dL 1.3(H) 1.4(H) 0.8  Alkaline Phos 38 - 126 U/L 307(H) 293(H) 138(H)  AST 15 - 41 U/L 141(H) 163(H) 51(H)  ALT 0 - 44 U/L 134(H) 127(H) 31    Lab Results  Component Value Date   WBC 11.4 (H)  05/05/2021   HGB 10.5 (L) 05/05/2021   HCT 32.2 (L) 05/05/2021   MCV 85.4 05/05/2021   PLT 202 05/05/2021   NEUTROABS 9.5 (H) 05/01/2021   I personally reviewed his CT imaging CT ABDOMEN PELVIS WO CONTRAST  Result Date: 05/01/2021 CLINICAL DATA:  Post colostomy reversal, postoperative anemia, fall in hemoglobin from 12-7 g EXAM: CT ABDOMEN AND PELVIS WITHOUT CONTRAST TECHNIQUE: Multidetector CT imaging of the abdomen and pelvis was performed following the standard protocol without IV contrast. Sagittal and coronal MPR images reconstructed from axial data set. No oral contrast administered. COMPARISON:  01/15/2021 FINDINGS: Lower chest: Bibasilar atelectasis Hepatobiliary: Gallbladder surgically absent.  Liver unremarkable. Pancreas: Atrophic, otherwise normal appearance Spleen: Normal appearance Adrenals/Urinary Tract: Adrenal glands, kidneys, and ureters normal appearance. Bladder decompressed by Foley catheter. Stomach/Bowel: Post colostomy reversal with anastomotic staple line in sigmoid colon. Prominent small bowel loops consistent with postoperative ileus. Normal appendix. Stomach unremarkable. Vascular/Lymphatic: Minimal atherosclerotic calcification aorta. Aorta normal caliber. No adenopathy. Reproductive: Uterus surgically absent with nonvisualization of ovaries Other: Scattered free air consistent with recent surgery. Small fluid collection at the colostomy takedown site, low-attenuation, 7.4 x 2.7 x 4.5 cm image 58. Minimal free fluid. High attenuation collection identified within the pelvis, 11.1 x 4.8 x 8.2 cm consistent with hematoma. No hernia. Musculoskeletal: Prior lumbar fusion L4-S1. Osseous structures otherwise unremarkable. IMPRESSION: Post colostomy reversal with anastomotic staple line in sigmoid colon. Prominent small bowel loops consistent with postoperative ileus. High attenuation collection within the pelvis 11.1 x 4.8 x 8.2 cm consistent with hematoma. Small fluid collection at  the colostomy takedown site 7.4 x 2.7 cm, could represent a sterile or infected postoperative collection. Bibasilar atelectasis. Aortic Atherosclerosis (ICD10-I70.0). Electronically Signed   By: Lavonia Dana M.D.   On: 05/01/2021 11:44      LOS: 7 days

## 2021-05-05 NOTE — Progress Notes (Signed)
PT Cancellation Note  Patient Details Name: [name] MRN: [id number] DOB: 1961/10/12   Cancelled Treatment:    Reason Eval/Treat Not Completed: Patient declined, no reason specified Pt dressed and reports she has ambulated already.  Pt ready to d/c home and awaiting IV removal.  RN notified.   Kache Mcclurg,KATHrine E 05/05/2021, 10:05 AM

## 2021-05-05 NOTE — Plan of Care (Signed)
Pt discharge to home with transportation arranged by patient. Pt verbalizes understanding and cooperation with all discharge education. Pt confirms all patient belongings accounted for and taken with pt at discharge.  Problem: Education: Goal: Knowledge of General Education information will improve Description: Including pain rating scale, medication(s)/side effects and non-pharmacologic comfort measures 05/05/2021 0836 by Barrington Ellison, RN Outcome: Adequate for Discharge 05/05/2021 [phone] by Barrington Ellison, RN Outcome: Adequate for Discharge   Problem: Health Behavior/Discharge Planning: Goal: Ability to manage health-related needs will improve 05/05/2021 0836 by Barrington Ellison, RN Outcome: Adequate for Discharge 05/05/2021 [phone] by Barrington Ellison, RN Outcome: Adequate for Discharge   Problem: Clinical Measurements: Goal: Ability to maintain clinical measurements within normal limits will improve 05/05/2021 0836 by Barrington Ellison, RN Outcome: Adequate for Discharge 05/05/2021 [phone] by Barrington Ellison, RN Outcome: Adequate for Discharge Goal: Will remain free from infection 05/05/2021 0836 by Barrington Ellison, RN Outcome: Adequate for Discharge 05/05/2021 [phone] by Barrington Ellison, RN Outcome: Adequate for Discharge Goal: Diagnostic test results will improve 05/05/2021 0836 by Barrington Ellison, RN Outcome: Adequate for Discharge 05/05/2021 [phone] by Barrington Ellison, RN Outcome: Adequate for Discharge Goal: Respiratory complications will improve 05/05/2021 0836 by Barrington Ellison, RN Outcome: Adequate for Discharge 05/05/2021 [phone] by Barrington Ellison, RN Outcome: Adequate for Discharge Goal: Cardiovascular complication will be avoided 05/05/2021 0836 by Barrington Ellison, RN Outcome: Adequate for Discharge 05/05/2021 [phone] by Barrington Ellison, RN Outcome: Adequate for Discharge   Problem: Activity: Goal: Risk for activity intolerance will decrease 05/05/2021 0836 by Barrington Ellison, RN Outcome: Adequate for Discharge 05/05/2021 [phone] by Barrington Ellison, RN Outcome: Adequate for Discharge   Problem: Nutrition: Goal: Adequate nutrition will be maintained 05/05/2021 0836 by Barrington Ellison, RN Outcome: Adequate for Discharge 05/05/2021 [phone] by Barrington Ellison, RN Outcome: Adequate for Discharge   Problem: Coping: Goal: Level of anxiety will decrease 05/05/2021 0836 by Barrington Ellison, RN Outcome: Adequate for Discharge 05/05/2021 [phone] by Barrington Ellison, RN Outcome: Adequate for Discharge   Problem: Elimination: Goal: Will not experience complications related to urinary retention 05/05/2021 0836 by Barrington Ellison, RN Outcome: Adequate for Discharge 05/05/2021 [phone] by Barrington Ellison, RN Outcome: Adequate for Discharge   Problem: Pain Managment: Goal: General experience of comfort will improve 05/05/2021 0836 by Barrington Ellison, RN Outcome: Adequate for Discharge 05/05/2021 [phone] by Barrington Ellison, RN Outcome: Adequate for Discharge   Problem: Safety: Goal: Ability to remain free from injury will improve 05/05/2021 0836 by Barrington Ellison, RN Outcome: Adequate for Discharge 05/05/2021 [phone] by Barrington Ellison, RN Outcome: Adequate for Discharge   Problem: Skin Integrity: Goal: Risk for impaired skin integrity will decrease 05/05/2021 0836 by Barrington Ellison, RN Outcome: Adequate for Discharge 05/05/2021 [phone] by Barrington Ellison, RN Outcome: Adequate for Discharge   Problem: Clinical Measurements: Goal: Postoperative complications will be avoided or minimized 05/05/2021 0836 by Barrington Ellison, RN Outcome: Adequate for Discharge 05/05/2021 [phone] by Barrington Ellison, RN Outcome: Adequate for Discharge   Problem: Skin Integrity: Goal: Demonstration of wound healing without infection will improve 05/05/2021 0836 by Barrington Ellison, RN Outcome: Adequate for Discharge 05/05/2021 0836 by Barrington Ellison, RN Outcome: Adequate for Discharge

## 2021-05-06 LAB — BPAM FFP
Blood Product Expiration Date: 202206272359
Blood Product Expiration Date: 202206272359
Unit Type and Rh: 5100
Unit Type and Rh: 5100

## 2021-05-06 LAB — PREPARE FRESH FROZEN PLASMA
Unit division: 0
Unit division: 0

## 2021-05-08 ENCOUNTER — Other Ambulatory Visit: Payer: Self-pay

## 2021-05-08 ENCOUNTER — Ambulatory Visit (INDEPENDENT_AMBULATORY_CARE_PROVIDER_SITE_OTHER): Payer: Medicare HMO | Admitting: General Surgery

## 2021-05-08 ENCOUNTER — Encounter: Payer: Self-pay | Admitting: General Surgery

## 2021-05-08 VITALS — BP 115/76 | HR 88 | Temp 98.4°F | Resp 14 | Ht 62.0 in | Wt 168.6 lb

## 2021-05-08 DIAGNOSIS — D693 Immune thrombocytopenic purpura: Secondary | ICD-10-CM

## 2021-05-08 DIAGNOSIS — Z9889 Other specified postprocedural states: Secondary | ICD-10-CM

## 2021-05-08 DIAGNOSIS — R748 Abnormal levels of other serum enzymes: Secondary | ICD-10-CM | POA: Insufficient documentation

## 2021-05-08 HISTORY — DX: Immune thrombocytopenic purpura: D69.3

## 2021-05-13 NOTE — Progress Notes (Signed)
[facility] Clinic Note  Patient doing well and having no complaints. She says she is tender at times but is healing well. Her Platelet count jumped back up and she was diagnosed with another episodes of idiopathic thrombocytopenia as we do not know why her platelets dropped so much.  She also had some elevation of her liver test but has had her gallbladder out in the past.  BP 115/76   Pulse 88   Temp 98.4 F (36.9 C) (Oral)   Resp 14   Ht 5\' 2"  (1.575 m)   Wt 168 lb 9.6 oz (76.5 kg)   SpO2 96%   BMI 30.84 kg/m   Her staples were removed, no signs of erythema or drainage Steri strips placed on colostomy closed site and midline  Will get CBC and CMP to look at her liver test and her platelet function. She wants to go to [place] to get these at [employer]. I have put in them in to the system.  No heavy lifting > 10 lbs, excessive bending, pushing, pulling, or squatting for 6-8 weeks after surgery.   Will see her back in a few weeks to check on her.   Future Appointments  Date Time Provider [place]  05/20/2021  3:15 PM [name], DO [facility] None  06/05/2021  2:45 PM Virl Cagey, MD [facility] None  09/08/2021  1:15 PM [name], DO [facility] None  09/22/2021  1:15 PM [facility]-ANNUAL WELLNESS VISIT [facility] None     [name], MD [facility] [address] [place], [place] [address] [phone] (office)

## 2021-05-15 ENCOUNTER — Telehealth: Payer: Self-pay | Admitting: Family Medicine

## 2021-05-15 NOTE — Telephone Encounter (Signed)
Pt was instructed to have blood work done after leaving her last office visit with Dr. Constance Haw. Pt was called and reminded to go have labs done but then called and left a vm stating that she had an apt with [facility] on 05/20/2021 and will have them drawn at that time.

## 2021-05-16 ENCOUNTER — Other Ambulatory Visit: Payer: Self-pay | Admitting: Family Medicine

## 2021-05-20 ENCOUNTER — Ambulatory Visit (INDEPENDENT_AMBULATORY_CARE_PROVIDER_SITE_OTHER): Payer: Medicare HMO | Admitting: Family Medicine

## 2021-05-20 ENCOUNTER — Other Ambulatory Visit: Payer: Self-pay

## 2021-05-20 ENCOUNTER — Encounter: Payer: Self-pay | Admitting: Family Medicine

## 2021-05-20 VITALS — BP 107/71 | HR 92 | Temp 98.7°F | Ht 62.0 in | Wt 161.8 lb

## 2021-05-20 DIAGNOSIS — R109 Unspecified abdominal pain: Secondary | ICD-10-CM

## 2021-05-20 DIAGNOSIS — Z09 Encounter for follow-up examination after completed treatment for conditions other than malignant neoplasm: Secondary | ICD-10-CM

## 2021-05-20 DIAGNOSIS — R748 Abnormal levels of other serum enzymes: Secondary | ICD-10-CM

## 2021-05-20 DIAGNOSIS — R69 Illness, unspecified: Secondary | ICD-10-CM | POA: Diagnosis not present

## 2021-05-20 DIAGNOSIS — F411 Generalized anxiety disorder: Secondary | ICD-10-CM

## 2021-05-20 DIAGNOSIS — M542 Cervicalgia: Secondary | ICD-10-CM | POA: Diagnosis not present

## 2021-05-20 DIAGNOSIS — G894 Chronic pain syndrome: Secondary | ICD-10-CM | POA: Diagnosis not present

## 2021-05-20 DIAGNOSIS — D693 Immune thrombocytopenic purpura: Secondary | ICD-10-CM

## 2021-05-20 DIAGNOSIS — M4322 Fusion of spine, cervical region: Secondary | ICD-10-CM | POA: Diagnosis not present

## 2021-05-20 DIAGNOSIS — Z9889 Other specified postprocedural states: Secondary | ICD-10-CM

## 2021-05-20 LAB — MICROSCOPIC EXAMINATION

## 2021-05-20 LAB — URINALYSIS, ROUTINE W REFLEX MICROSCOPIC
Bilirubin, UA: NEGATIVE
Glucose, UA: NEGATIVE
Ketones, UA: NEGATIVE
Nitrite, UA: NEGATIVE
RBC, UA: NEGATIVE
Specific Gravity, UA: 1.02 (ref 1.005–1.030)
Urobilinogen, Ur: 0.2 mg/dL (ref 0.2–1.0)
pH, UA: 7 (ref 5.0–7.5)

## 2021-05-20 MED ORDER — BUSPIRONE HCL 5 MG PO TABS: 5.0000 mg | ORAL_TABLET | Freq: Three times a day (TID) | ORAL | 3 refills | Status: DC

## 2021-05-20 NOTE — Progress Notes (Signed)
Subjective: CC: Hospital discharge follow-up PCP: [name], DO UTM:LYYTKP A Taketa is a 60 y.o. female presenting to clinic today for:  1.  Hospital discharge follow-up Patient was admitted initially to [facility] and required ostomy reversal and developed subsequent hematoma in the pelvis.  She developed thrombocytopenia and had to be transitioned over to [facility] long hospital due to lack of appropriate specialist.  She was transfused during her hospitalization.  At time of discharge her liver function tests were still elevated.  Though her platelet count had normalized and her hemoglobin was stable at 10.5.  She presents today for 2-week follow-up and repeat labs.  She has already seen her general surgeon on outpatient basis.  She has follow-up within the next few weeks.  She sees a pain clinic for chronic pain management notes her pain is under decent control.  She continues to take her pain medication as prescribed by her pain specialist and constipation has been well controlled with MiraLAX.  She has had some bowel movements and they seem to be gradually getting more normalized since her ostomy reversal.  However, she does admit that she has had some intra-abdominal pain as her bowels have gradually woken up.  Denies any blood in stool.  She has a family member, who is a retired Marine scientist, who resides with her and keeps a very close eye on her.  Infection "saved her life" as she is the one that realized that her bowel sounds had diminished and she in fact perforated the bowel.  She does note some pressure along the right lower quadrant but unsure if this is related to recent surgery versus urinary tract infection.  No reports of hematuria or fevers.  She admits that this has been emotionally taxing and that she has anxiety and depression despite compliance with BuSpar and Celexa.  She is also treated with a tricyclic antidepressant a couple of times a day for neuropathic pain.   She does not report any serotonin syndrome symptoms.  She certainly does not want to go back on any benzodiazepines as her mental health has been better without them.  She has not tolerated Wellbutrin in the past but admits that she only took it as a single treatment and not concomitantly with any other medicines.   ROS: Per HPI  Allergies  Allergen Reactions   Nsaids Nausea Only    Abdominal pain   Past Medical History:  Diagnosis Date   Abdominal pain    Allergy    seasonal   Anxiety    Arthritis    Blood in stool    Breast changes, fibrocystic    Bruises easily    Cardiomegaly 2018   Clotting disorder (HCC)     itp , none since 1976, no current hematologist   Colon perforation due to stercoral ulcer with E. coli fecal peritonitis    Constipation    DDD (degenerative disc disease), cervical    with lumbar issues   Depression    E. coli sepsis -due to fecal peritonitis 12/23/2020   Generalized headaches    GERD (gastroesophageal reflux disease)    Glaucoma    Hemorrhoid    Hyperlipidemia    Hypertension    Idiopathic thrombocytopenia ([place])    as child   Leg swelling    both ankles   Liver lesion    Nasal congestion    Nausea & vomiting    Neuromuscular disorder ([place])    back  injury  Neuromuscular disorder ([place])    3 neck surgeries,neck fusion.pin,plates,screws   Osteoporosis    Panic attacks    pt also  has germaphobia   Rectal bleeding    Seizures ([place]) 1998   stress induced, one time, none since, no seizure meds   Seizures ([place])    Stated she had 2 weeks ago,it was like a transient,stare.04/01/21 pt. denies   Spleen enlarged 1976, none since   r/t idiopathic thrombocytopenia   Stercoral ulcer of large intestine 12/21/2020   Trouble swallowing    Unintentional weight loss    Weakness    weakness varies    Current Outpatient Medications:    amitriptyline (ELAVIL) 25 MG tablet, Take 25-50 mg by mouth See admin instructions. Take 50 mg by mouth at  bedtime, may take 25 mg during the day if needed for pain, Disp: , Rfl:    atenolol (TENORMIN) 50 MG tablet, Take 1 tablet (50 mg total) by mouth daily., Disp: 90 tablet, Rfl: 3   busPIRone (BUSPAR) 15 MG tablet, Take 1 tablet (15 mg total) by mouth 3 (three) times daily., Disp: 270 tablet, Rfl: 3   cetirizine (ZYRTEC) 10 MG tablet, Take 10 mg by mouth daily., Disp: , Rfl:    citalopram (CELEXA) 40 MG tablet, Take 1 tablet (40 mg total) by mouth daily with breakfast., Disp: 90 tablet, Rfl: 3   famotidine (PEPCID) 20 MG tablet, Take 1 tablet (20 mg total) by mouth daily as needed for heartburn or indigestion., Disp: 30 tablet, Rfl:    latanoprost (XALATAN) 0.005 % ophthalmic solution, Place 1 drop into both eyes at bedtime., Disp: , Rfl:    lisinopril-hydrochlorothiazide (ZESTORETIC) 20-25 MG tablet, TAKE 1 TABLET BY MOUTH EVERY DAY, Disp: 90 tablet, Rfl: 2   nitroGLYCERIN (NITROSTAT) 0.4 MG SL tablet, Place 1 tablet (0.4 mg total) under the tongue every 5 (five) minutes as needed for chest pain., Disp: 25 tablet, Rfl: 3   ondansetron (ZOFRAN) 4 MG tablet, Take 4 mg by mouth every 8 (eight) hours as needed for nausea., Disp: , Rfl:    Oxycodone HCl 10 MG TABS, Take 1 tablet (10 mg total) by mouth 2 (two) times daily., Disp: , Rfl: 0   Polyethylene Glycol 3350 (MIRALAX PO), Take 17 g by mouth in the morning and at bedtime., Disp: , Rfl:    rosuvastatin (CRESTOR) 20 MG tablet, TAKE 1 TABLET BY MOUTH EVERY DAY, Disp: 90 tablet, Rfl: 0   tiZANidine (ZANAFLEX) 4 MG tablet, Take 4 mg by mouth every 8 (eight) hours as needed for muscle spasms., Disp: , Rfl:    traZODone (DESYREL) 100 MG tablet, TAKE 1 AND 1/2 TABLET (150 MG TOTAL) BY MOUTH AT BEDTIME AS NEEDED FOR SLEEP., Disp: 135 tablet, Rfl: 2 Social History   Socioeconomic History   Marital status: Married    Spouse name: Legrand Como   Number of children: 3   Years of education: 14   Highest education level: Associate degree: academic program   Occupational History   Occupation: Corporate investment banker: UNEMPLOYED  Tobacco Use   Smoking status: Every Day    Packs/day: 1.00    Years: 12.00    Pack years: 12.00    Types: Cigarettes   Smokeless tobacco: Never  Vaping Use   Vaping Use: Former   Quit date: 09/07/2016   Devices: only used for about a month  Substance and Sexual Activity   Alcohol use: No    Alcohol/week: 0.0 standard drinks  Drug use: Yes    Types: Marijuana   Sexual activity: Yes  Other Topics Concern   Not on file  Social History Narrative   She lives with her boyfriend.  She used to smoke, quit May 2013, she is not drinking. She has 3 children   Social Determinants of Radio broadcast assistant Strain: Not on file  Food Insecurity: Not on file  Transportation Needs: Not on file  Physical Activity: Not on file  Stress: Not on file  Social Connections: Not on file  Intimate Partner Violence: Not on file   Family History  Problem Relation Age of Onset   Hypertension Mother    Heart disease Mother    Lung cancer Father    Cancer Father        lung   Colon cancer Paternal Grandmother    Cancer Paternal Grandmother        colon   Heart disease Maternal Grandmother    Diabetes Sister    Bipolar disorder Sister    Heart disease Sister        Congestive Heart Failure   Hypertension Sister    Esophageal cancer Neg Hx    Rectal cancer Neg Hx    Stomach cancer Neg Hx     Objective: Office vital signs reviewed. BP 107/71   Pulse 92   Temp 98.7 F (37.1 C)   Ht 5\' 2"  (1.575 m)   Wt 161 lb 12.8 oz (73.4 kg)   SpO2 96%   BMI 29.59 kg/m   Physical Examination:  General: Awake, alert, chronically ill-appearing female, No acute distress HEENT: Her skin is somewhat pale but she does not appear to have jaundice or scleral icterus.  Conjunctival pallor is present Cardio: regular rate   Pulm:  normal work of breathing on room air GI: Postsurgical sites appear to be healthy and without  secondary infection.  She does have mild generalized tenderness to palpation with predominance in the right lower quadrant.  No rebound or guarding however. Psych: Patient is intermittently tearful.  She is very pleasant and interactive her thought process is linear.  Does not appear to be responding to internal stimuli.  Depression screen [facility] 2/9 05/20/2021 09/18/2020 09/06/2020  Decreased Interest 3 0 0  Down, Depressed, Hopeless 2 0 0  PHQ - 2 Score 5 0 0  Altered sleeping 3 - -  Tired, decreased energy 3 - -  Change in appetite 2 - -  Feeling bad or failure about yourself  2 - -  Trouble concentrating 2 - -  Moving slowly or fidgety/restless 0 - -  Suicidal thoughts 0 - -  PHQ-9 Score 17 - -  Difficult doing work/chores - - -  Some recent data might be hidden   GAD 7 : Generalized Anxiety Score 05/20/2021 09/06/2020 05/30/2020 01/29/2020  Nervous, Anxious, on Edge 3 0 3 1  Control/stop worrying 1 0 3 1  Worry too much - different things 2 0 3 1  Trouble relaxing 2 0 3 1  Restless 0 0 3 0  Easily annoyed or irritable 2 0 3 0  Afraid - awful might happen 2 0 0 0  Total GAD 7 Score 12 0 18 4  Anxiety Difficulty Somewhat difficult - - Not difficult at all      Assessment/ Plan: 60 y.o. female   Hospital discharge follow-up  History of colostomy reversal  Elevated liver enzymes - Plan: Comprehensive metabolic panel  GAD (generalized anxiety disorder) -  Plan: busPIRone (BUSPAR) 5 MG tablet  Side pain - Plan: Urinalysis, Routine w reflex microscopic, Urine Culture  Acute ITP ([place]) - Plan: CBC with Differential  I reviewed her discharge course in summary.  Future orders had already been placed by her general surgeon and these will be collected today.  Urinalysis was obtained and it did show a few bacteria but really no other overt evidence of infection.  For this reason I am sending it off for culture.  If it comes back with significant growth we will proceed with  antibiotics.  Her mental health certainly has declined in the setting of her very complex medical issues over this year.  I gave her various options including GeneSight testing, advancing her buspirone to 20 mg 3 times daily, adding Wellbutrin and or tapering off of the Celexa and up on her tricyclic antidepressant.  We did discuss that her Celexa plus her tricyclic antidepressant carry a much higher risk of serotonin syndrome so would favor her coming off of 1 of these and simply maximizing dose.  She voiced good understanding.  We will reconvene in 4 weeks.  If around 3 weeks she is not seeing a huge difference in symptoms with the increased dose of BuSpar she is interested in GeneSight testing we will see Delight Ovens for that.  Additionally, I offered counseling services but she politely declines.  No orders of the defined types were placed in this encounter.  No orders of the defined types were placed in this encounter.   Today's visit is for Transitional Care Management.  The patient was discharged from [facility] on [facility]/05/21 with a primary diagnosis of acute thrombocytopenia and ostomy reversal.   Contact with the patient and/or caregiver, by a clinical staff member, was made on 05/05/21 and was documented as a telephone encounter within the EMR.  Through chart review and discussion with the patient I have determined that management of their condition is of moderate complexity.    [name], DO [place] [phone]

## 2021-05-21 LAB — CBC WITH DIFFERENTIAL/PLATELET
Basophils Absolute: 0.1 10*3/uL (ref 0.0–0.2)
Basos: 1 %
EOS (ABSOLUTE): 0.5 10*3/uL — ABNORMAL HIGH (ref 0.0–0.4)
Eos: 5 %
Hematocrit: 45.1 % (ref 34.0–46.6)
Hemoglobin: 14.9 g/dL (ref 11.1–15.9)
Immature Grans (Abs): 0.1 10*3/uL (ref 0.0–0.1)
Immature Granulocytes: 1 %
Lymphocytes Absolute: 3.5 10*3/uL — ABNORMAL HIGH (ref 0.7–3.1)
Lymphs: 34 %
MCH: 28.3 pg (ref 26.6–33.0)
MCHC: 33 g/dL (ref 31.5–35.7)
MCV: 86 fL (ref 79–97)
Monocytes Absolute: 1.1 10*3/uL — ABNORMAL HIGH (ref 0.1–0.9)
Monocytes: 10 %
Neutrophils Absolute: 5.1 10*3/uL (ref 1.4–7.0)
Neutrophils: 49 %
Platelets: 487 10*3/uL — ABNORMAL HIGH (ref 150–450)
RBC: 5.27 x10E6/uL (ref 3.77–5.28)
RDW: 15.1 % (ref 11.7–15.4)
WBC: 10.4 10*3/uL (ref 3.4–10.8)

## 2021-05-22 LAB — COMPREHENSIVE METABOLIC PANEL
ALT: 26 IU/L (ref 0–32)
AST: 36 IU/L (ref 0–40)
Albumin/Globulin Ratio: 1.4 (ref 1.2–2.2)
Albumin: 4.3 g/dL (ref 3.8–4.8)
Alkaline Phosphatase: 389 IU/L — ABNORMAL HIGH (ref 44–121)
BUN/Creatinine Ratio: 12 (ref 12–28)
BUN: 11 mg/dL (ref 8–27)
Bilirubin Total: 0.5 mg/dL (ref 0.0–1.2)
CO2: 24 mmol/L (ref 20–29)
Calcium: 10.5 mg/dL — ABNORMAL HIGH (ref 8.7–10.3)
Chloride: 94 mmol/L — ABNORMAL LOW (ref 96–106)
Creatinine, Ser: 0.94 mg/dL (ref 0.57–1.00)
Globulin, Total: 3 g/dL (ref 1.5–4.5)
Glucose: 108 mg/dL — ABNORMAL HIGH (ref 65–99)
Potassium: 4.5 mmol/L (ref 3.5–5.2)
Sodium: 138 mmol/L (ref 134–144)
Total Protein: 7.3 g/dL (ref 6.0–8.5)
eGFR: 69 mL/min/{1.73_m2} (ref >59)

## 2021-05-22 LAB — URINE CULTURE

## 2021-05-25 ENCOUNTER — Encounter: Payer: Self-pay | Admitting: Family Medicine

## 2021-06-05 ENCOUNTER — Ambulatory Visit (INDEPENDENT_AMBULATORY_CARE_PROVIDER_SITE_OTHER): Payer: Medicare HMO | Admitting: General Surgery

## 2021-06-05 ENCOUNTER — Telehealth: Payer: Self-pay | Admitting: Family Medicine

## 2021-06-05 ENCOUNTER — Encounter: Payer: Medicare HMO | Admitting: General Surgery

## 2021-06-05 ENCOUNTER — Encounter: Payer: Self-pay | Admitting: General Surgery

## 2021-06-05 ENCOUNTER — Other Ambulatory Visit: Payer: Self-pay

## 2021-06-05 VITALS — BP 103/72 | HR 86 | Temp 98.2°F | Resp 16 | Ht 64.0 in | Wt 164.0 lb

## 2021-06-05 DIAGNOSIS — Z9889 Other specified postprocedural states: Secondary | ICD-10-CM

## 2021-06-05 DIAGNOSIS — D693 Immune thrombocytopenic purpura: Secondary | ICD-10-CM

## 2021-06-05 DIAGNOSIS — R748 Abnormal levels of other serum enzymes: Secondary | ICD-10-CM

## 2021-06-05 NOTE — Telephone Encounter (Signed)
Pt called today to cancel apt stating that she would call back to reschedule but wanted to make [place] aware that she still had a staple that Dr. Constance Haw missed and it would need to be removed. She then states that she knows it's going to hurt like hell cause it is grown over. I offered to reschedule her apt and she was insistent on calling back.

## 2021-06-05 NOTE — Progress Notes (Signed)
[facility]  One retained staple at the ostomy site. Removed. No signs of hernia, minor induration at ostomy and midline. Eating and having Bms. BP 103/72   Pulse 86   Temp 98.2 F (36.8 C) (Other (Comment))   Resp 16   Ht '5\' 4"'$  (1.626 m)   Wt 164 lb (74.4 kg)   SpO2 95%   BMI 28.15 kg/m   Sees Dr. Lajuana Ripple in August. Will need repeat labs for the elevated alkaline phosphatase, everything else was improved.   Will assess in September to make sure induration improved and no hernia formed at ostomy site.   Future Appointments  Date Time Provider [place]  06/18/2021 10:30 AM [name], DO [facility] None  06/27/2021  3:00 PM Leotis Pain [facility]  07/24/2021  1:00 PM Virl Cagey, MD [facility] None  09/08/2021  1:15 PM [name], DO [facility] None  09/22/2021  1:15 PM [facility]-ANNUAL WELLNESS VISIT [facility] None   [name], MD [facility] [address] [place], [place] [address] [phone] (office)

## 2021-06-10 ENCOUNTER — Other Ambulatory Visit: Payer: Self-pay | Admitting: Family Medicine

## 2021-06-10 DIAGNOSIS — R748 Abnormal levels of other serum enzymes: Secondary | ICD-10-CM

## 2021-06-13 ENCOUNTER — Other Ambulatory Visit: Payer: Self-pay | Admitting: Family Medicine

## 2021-06-13 DIAGNOSIS — F411 Generalized anxiety disorder: Secondary | ICD-10-CM

## 2021-06-18 ENCOUNTER — Ambulatory Visit (INDEPENDENT_AMBULATORY_CARE_PROVIDER_SITE_OTHER): Payer: Medicare HMO | Admitting: Family Medicine

## 2021-06-18 ENCOUNTER — Other Ambulatory Visit: Payer: Self-pay

## 2021-06-18 ENCOUNTER — Encounter: Payer: Self-pay | Admitting: Family Medicine

## 2021-06-18 VITALS — BP 115/77 | HR 86 | Temp 97.8°F | Ht 64.0 in | Wt 169.6 lb

## 2021-06-18 DIAGNOSIS — Z9889 Other specified postprocedural states: Secondary | ICD-10-CM

## 2021-06-18 DIAGNOSIS — R7989 Other specified abnormal findings of blood chemistry: Secondary | ICD-10-CM

## 2021-06-18 DIAGNOSIS — F411 Generalized anxiety disorder: Secondary | ICD-10-CM

## 2021-06-18 DIAGNOSIS — R748 Abnormal levels of other serum enzymes: Secondary | ICD-10-CM | POA: Diagnosis not present

## 2021-06-18 DIAGNOSIS — R69 Illness, unspecified: Secondary | ICD-10-CM | POA: Diagnosis not present

## 2021-06-18 NOTE — Patient Instructions (Signed)

## 2021-06-18 NOTE — Progress Notes (Signed)
Subjective: CC: Anxiety PCP: [name], DO NID:[id number] [name] is Betty 60 y.o. female presenting to clinic today for:  1.  Anxiety disorder Patient reports excellent response to the BuSpar 20 mg, which was an increase from her 15 mg 3 times daily.  She continues to take Celexa, amitriptyline (for pain) and trazodone.  Still having some sleep issues but previously responded well to trazodone so she is willing to wait it out.  Denies any serotonin syndrome symptoms.  2.  History of emergent colostomy reversal/elevated liver function tests, elevated platelet level Worried about Betty hernia on the left side as she has noticed quite Betty bit of fullness on the left compared to the right.  Does not report any change in bowel habits, abdominal pain or bleeding.  In fact her bowel habits have gotten Betty lot better since her last visit and she is passing stool and gas without difficulty now.  Her appetite has increased quite Betty bit and she has gained 12 pounds as Betty result.  Would like to get her liver function test and platelet level repeated today as these were both elevated on last visit   ROS: Per HPI  Allergies  Allergen Reactions   Nsaids Nausea Only    Abdominal pain   Past Medical History:  Diagnosis Date   Abdominal pain    Allergy    seasonal   Anxiety    Arthritis    Blood in stool    Breast changes, fibrocystic    Bruises easily    Cardiomegaly 2018   Clotting disorder (HCC)     itp , none since 1976, no current hematologist   Colon perforation due to stercoral ulcer with E. coli fecal peritonitis    Constipation    DDD (degenerative disc disease), cervical    with lumbar issues   Depression    E. coli sepsis -due to fecal peritonitis 12/23/2020   Generalized headaches    GERD (gastroesophageal reflux disease)    Glaucoma    Hemorrhoid    Hyperlipidemia    Hypertension    Idiopathic thrombocytopenia ([place])    as child   Leg swelling    both ankles   Liver lesion     Nasal congestion    Nausea & vomiting    Neuromuscular disorder (HCC)    back  injury   Neuromuscular disorder ([place])    3 neck surgeries,neck fusion.pin,plates,screws   Osteoporosis    Panic attacks    pt also  has germaphobia   Rectal bleeding    Seizures ([place]) 1998   stress induced, one time, none since, no seizure meds   Seizures ([place])    Stated she had 2 weeks ago,it was like Betty transient,stare.04/01/21 pt. denies   Spleen enlarged 1976, none since   r/t idiopathic thrombocytopenia   Stercoral ulcer of large intestine 12/21/2020   Trouble swallowing    Unintentional weight loss    Weakness    weakness varies    Current Outpatient Medications:    amitriptyline (ELAVIL) 25 MG tablet, Take 25-50 mg by mouth See admin instructions. Take 50 mg by mouth at bedtime, may take 25 mg during the day if needed for pain, Disp: , Rfl:    atenolol (TENORMIN) 50 MG tablet, Take 1 tablet (50 mg total) by mouth daily., Disp: 90 tablet, Rfl: 3   busPIRone (BUSPAR) 15 MG tablet, Take 1 tablet (15 mg total) by mouth 3 (three) times daily., Disp: 270 tablet, Rfl:  3   busPIRone (BUSPAR) 5 MG tablet, TAKE 1 TABLET (5 MG TOTAL) BY MOUTH 3 (THREE) TIMES DAILY. TAKE 1 TABLET WITH THE 15MG TO EQUAL 20MG., Disp: 270 tablet, Rfl: 0   cetirizine (ZYRTEC) 10 MG tablet, Take 10 mg by mouth daily., Disp: , Rfl:    citalopram (CELEXA) 40 MG tablet, Take 1 tablet (40 mg total) by mouth daily with breakfast., Disp: 90 tablet, Rfl: 3   famotidine (PEPCID) 20 MG tablet, Take 1 tablet (20 mg total) by mouth daily as needed for heartburn or indigestion., Disp: 30 tablet, Rfl:    latanoprost (XALATAN) 0.005 % ophthalmic solution, Place 1 drop into both eyes at bedtime., Disp: , Rfl:    lisinopril-hydrochlorothiazide (ZESTORETIC) 20-25 MG tablet, TAKE 1 TABLET BY MOUTH EVERY DAY, Disp: 90 tablet, Rfl: 2   nitroGLYCERIN (NITROSTAT) 0.4 MG SL tablet, Place 1 tablet (0.4 mg total) under the tongue every 5 (five) minutes as  needed for chest pain., Disp: 25 tablet, Rfl: 3   ondansetron (ZOFRAN) 4 MG tablet, Take 4 mg by mouth every 8 (eight) hours as needed for nausea., Disp: , Rfl:    Oxycodone HCl 10 MG TABS, Take 1 tablet (10 mg total) by mouth 2 (two) times daily., Disp: , Rfl: 0   Polyethylene Glycol 3350 (MIRALAX PO), Take 17 g by mouth in the morning and at bedtime., Disp: , Rfl:    tiZANidine (ZANAFLEX) 4 MG tablet, Take 4 mg by mouth every 8 (eight) hours as needed for muscle spasms., Disp: , Rfl:    traZODone (DESYREL) 100 MG tablet, TAKE 1 AND 1/2 TABLET (150 MG TOTAL) BY MOUTH AT BEDTIME AS NEEDED FOR SLEEP., Disp: 135 tablet, Rfl: 2 Social History   Socioeconomic History   Marital status: Married    Spouse name: Legrand Como   Number of children: 3   Years of education: 14   Highest education level: Associate degree: academic program  Occupational History   Occupation: Corporate investment banker: UNEMPLOYED  Tobacco Use   Smoking status: Every Day    Packs/day: 1.00    Years: 12.00    Pack years: 12.00    Types: Cigarettes   Smokeless tobacco: Never  Vaping Use   Vaping Use: Former   Quit date: 09/07/2016   Devices: only used for about Betty month  Substance and Sexual Activity   Alcohol use: No    Alcohol/week: 0.0 standard drinks   Drug use: Yes    Types: Marijuana   Sexual activity: Yes  Other Topics Concern   Not on file  Social History Narrative   She lives with her boyfriend.  She used to smoke, quit May 2013, she is not drinking. She has 3 children   Social Determinants of Radio broadcast assistant Strain: Not on file  Food Insecurity: Not on file  Transportation Needs: Not on file  Physical Activity: Not on file  Stress: Not on file  Social Connections: Not on file  Intimate Partner Violence: Not on file   Family History  Problem Relation Age of Onset   Hypertension Mother    Heart disease Mother    Lung cancer Father    Cancer Father        lung   Colon cancer Paternal  Grandmother    Cancer Paternal Grandmother        colon   Heart disease Maternal Grandmother    Diabetes Sister    Bipolar disorder Sister    Heart  disease Sister        Congestive Heart Failure   Hypertension Sister    Esophageal cancer Neg Hx    Rectal cancer Neg Hx    Stomach cancer Neg Hx     Objective: Office vital signs reviewed. BP 115/77   Pulse 86   Temp 97.8 F (36.6 C)   Ht '5\' 4"'  (1.626 m)   Wt 169 lb 9.6 oz (76.9 kg)   SpO2 95%   BMI 29.11 kg/m   Physical Examination:  General: Awake, alert, No acute distress HEENT: Normal; sclera white GI: soft, non-tender, left-sided abdominal fullness that is asymmetric compared to the right noted.  She has Betty well healed midline surgical scar. Psych: Mood stable, patient is pleasant and interactive.  Depression screen [facility] 2/9 05/20/2021 09/18/2020 09/06/2020  Decreased Interest 3 0 0  Down, Depressed, Hopeless 2 0 0  PHQ - 2 Score 5 0 0  Altered sleeping 3 - -  Tired, decreased energy 3 - -  Change in appetite 2 - -  Feeling bad or failure about yourself  2 - -  Trouble concentrating 2 - -  Moving slowly or fidgety/restless 0 - -  Suicidal thoughts 0 - -  PHQ-9 Score 17 - -  Difficult doing work/chores - - -  Some recent data might be hidden   GAD 7 : Generalized Anxiety Score 05/20/2021 09/06/2020 05/30/2020 01/29/2020  Nervous, Anxious, on Edge 3 0 3 1  Control/stop worrying 1 0 3 1  Worry too much - different things 2 0 3 1  Trouble relaxing 2 0 3 1  Restless 0 0 3 0  Easily annoyed or irritable 2 0 3 0  Afraid - awful might happen 2 0 0 0  Total GAD 7 Score 12 0 18 4  Anxiety Difficulty Somewhat difficult - - Not difficult at all      Assessment/ Plan: 60 y.o. female   GAD (generalized anxiety disorder)  Elevated alkaline phosphatase level - Plan: CMP14+EGFR  Elevated platelet count - Plan: CBC  History of colostomy reversal   Has responded to the BuSpar dose increased excellently.  She would  like to continue on current regimen.  Still having Betty little issue with sleep but I am not sure if this is an acute issue versus something that we will continue on as she is had previously good control with trazodone.  Discussed that combo of her TCA, SSRI and trazodone again does increase risk for serotonin syndrome though she demonstrates none of the symptoms today.  Would favor tapering off of the trazodone and replacing with alternative.  Could consider trial of Atarax or Belsomra or Ambien.  She will let me know if she wishes to switch off.  Recheck CMP, CBC.  Left-sided abdominal fullness noted on exam.  Uncertain if this is residual from most recent emergency surgery.  She was worried about Betty possible hernia but I cannot appreciate Betty specific abdominal wall defect to suggest this.  Advised watchfully wait.  Caution red flag signs and symptoms.  If needed, we can consider obtaining Betty CT abdomen.  No orders of the defined types were placed in this encounter.  No orders of the defined types were placed in this encounter.    [name], DO [place] [phone]

## 2021-06-19 DIAGNOSIS — G894 Chronic pain syndrome: Secondary | ICD-10-CM | POA: Diagnosis not present

## 2021-06-19 DIAGNOSIS — M4322 Fusion of spine, cervical region: Secondary | ICD-10-CM | POA: Diagnosis not present

## 2021-06-19 DIAGNOSIS — M542 Cervicalgia: Secondary | ICD-10-CM | POA: Diagnosis not present

## 2021-06-19 LAB — CMP14+EGFR
ALT: 18 IU/L (ref 0–32)
AST: 17 IU/L (ref 0–40)
Albumin/Globulin Ratio: 1.4 (ref 1.2–2.2)
Albumin: 4.1 g/dL (ref 3.8–4.8)
Alkaline Phosphatase: 145 IU/L — ABNORMAL HIGH (ref 44–121)
BUN/Creatinine Ratio: 17 (ref 12–28)
BUN: 12 mg/dL (ref 8–27)
Bilirubin Total: 0.3 mg/dL (ref 0.0–1.2)
CO2: 25 mmol/L (ref 20–29)
Calcium: 10 mg/dL (ref 8.7–10.3)
Chloride: 97 mmol/L (ref 96–106)
Creatinine, Ser: 0.71 mg/dL (ref 0.57–1.00)
Globulin, Total: 2.9 g/dL (ref 1.5–4.5)
Glucose: 103 mg/dL — ABNORMAL HIGH (ref 65–99)
Potassium: 4.4 mmol/L (ref 3.5–5.2)
Sodium: 141 mmol/L (ref 134–144)
Total Protein: 7 g/dL (ref 6.0–8.5)
eGFR: 96 mL/min/{1.73_m2} (ref >59)

## 2021-06-19 LAB — CBC
Hematocrit: 47.3 % — ABNORMAL HIGH (ref 34.0–46.6)
Hemoglobin: 15.2 g/dL (ref 11.1–15.9)
MCH: 27.6 pg (ref 26.6–33.0)
MCHC: 32.1 g/dL (ref 31.5–35.7)
MCV: 86 fL (ref 79–97)
Platelets: 333 10*3/uL (ref 150–450)
RBC: 5.51 x10E6/uL — ABNORMAL HIGH (ref 3.77–5.28)
RDW: 15.1 % (ref 11.7–15.4)
WBC: 10.5 10*3/uL (ref 3.4–10.8)

## 2021-06-22 ENCOUNTER — Encounter: Payer: Self-pay | Admitting: Family Medicine

## 2021-06-22 DIAGNOSIS — R7989 Other specified abnormal findings of blood chemistry: Secondary | ICD-10-CM

## 2021-06-24 ENCOUNTER — Other Ambulatory Visit: Payer: Self-pay | Admitting: General Surgery

## 2021-06-25 ENCOUNTER — Other Ambulatory Visit: Payer: Self-pay | Admitting: Family Medicine

## 2021-06-25 MED ORDER — POLYETHYLENE GLYCOL 3350 17 GM/SCOOP PO POWD: 17.0000 g | Freq: Two times a day (BID) | ORAL | 99 refills | Status: AC

## 2021-06-27 ENCOUNTER — Ambulatory Visit: Payer: Medicare HMO | Admitting: Physician Assistant

## 2021-07-07 ENCOUNTER — Other Ambulatory Visit: Payer: Self-pay | Admitting: Family Medicine

## 2021-07-07 DIAGNOSIS — Z1231 Encounter for screening mammogram for malignant neoplasm of breast: Secondary | ICD-10-CM

## 2021-07-09 ENCOUNTER — Ambulatory Visit
Admission: RE | Admit: 2021-07-09 | Discharge: 2021-07-09 | Disposition: A | Discharge status: Home / Self Care | Payer: Medicare HMO | Source: Ambulatory Visit | Attending: Family Medicine | Admitting: Family Medicine

## 2021-07-09 ENCOUNTER — Other Ambulatory Visit: Payer: Self-pay

## 2021-07-09 DIAGNOSIS — Z1231 Encounter for screening mammogram for malignant neoplasm of breast: Secondary | ICD-10-CM | POA: Diagnosis not present

## 2021-07-11 ENCOUNTER — Other Ambulatory Visit: Payer: Self-pay | Admitting: Family Medicine

## 2021-07-11 DIAGNOSIS — R748 Abnormal levels of other serum enzymes: Secondary | ICD-10-CM

## 2021-07-11 DIAGNOSIS — R140 Abdominal distension (gaseous): Secondary | ICD-10-CM

## 2021-07-11 DIAGNOSIS — Z9889 Other specified postprocedural states: Secondary | ICD-10-CM

## 2021-07-11 DIAGNOSIS — R1084 Generalized abdominal pain: Secondary | ICD-10-CM

## 2021-07-15 ENCOUNTER — Telehealth: Payer: Self-pay | Admitting: Family Medicine

## 2021-07-15 ENCOUNTER — Other Ambulatory Visit: Payer: Self-pay | Admitting: Family Medicine

## 2021-07-15 ENCOUNTER — Encounter: Payer: Self-pay | Admitting: Nurse Practitioner

## 2021-07-15 ENCOUNTER — Ambulatory Visit (INDEPENDENT_AMBULATORY_CARE_PROVIDER_SITE_OTHER): Payer: Medicare HMO | Admitting: Nurse Practitioner

## 2021-07-15 DIAGNOSIS — R928 Other abnormal and inconclusive findings on diagnostic imaging of breast: Secondary | ICD-10-CM

## 2021-07-15 DIAGNOSIS — B370 Candidal stomatitis: Secondary | ICD-10-CM | POA: Diagnosis not present

## 2021-07-15 MED ORDER — NYSTATIN 100000 UNIT/ML MT SUSP: 5.0000 mL | Freq: Four times a day (QID) | OROMUCOSAL | 0 refills | Status: DC

## 2021-07-15 NOTE — Assessment & Plan Note (Signed)
Nystatin mouthwash for oral thrush.  Instructions given to patient over the phone.  Patient verbalized understanding.

## 2021-07-15 NOTE — Progress Notes (Signed)
    Virtual Visit  Note Due to COVID-19 pandemic this visit was conducted virtually. This visit type was conducted due to national recommendations for restrictions regarding the COVID-19 Pandemic (e.g. social distancing, sheltering in place) in an effort to limit this patient's exposure and mitigate transmission in our community. All issues noted in this document were discussed and addressed.  A physical exam was not performed with this format.  I connected with [name] on 07/15/21 at 1:25 p.m. by telephone and verified that I am speaking with the correct person using two identifiers. [name] is currently located at home during visit. The provider, Ivy Lynn, NP is located in their office at time of visit.  I discussed the limitations, risks, security and privacy concerns of performing an evaluation and management service by telephone and the availability of in person appointments. I also discussed with the patient that there may be a patient responsible charge related to this service. The patient expressed understanding and agreed to proceed.   History and Present Illness:  HPI  Patient is a 60 year old female who presents with oral thrush.  Patient reports that this is not new and she has not had complete resolution.  She is requesting refill of her medication to complete treatment.  No fever, nausea, burning, or pain associated with current complaint.   Review of Systems  Constitutional:  Negative for chills, fever, malaise/fatigue and weight loss.  HENT: Negative.    Respiratory: Negative.    Cardiovascular: Negative.   Gastrointestinal:  Negative for nausea and vomiting.  Skin:  Negative for rash.  All other systems reviewed and are negative.   Observations/Objective: Televisit patient not in distress.  Assessment and Plan: Nystatin mouthwash for oral thrush.  Instructions given to patient over the phone.  Patient verbalized understanding.  Follow Up  Instructions: Follow-up with worsening unresolved symptoms.    I discussed the assessment and treatment plan with the patient. The patient was provided an opportunity to ask questions and all were answered. The patient agreed with the plan and demonstrated an understanding of the instructions.   The patient was advised to call back or seek an in-person evaluation if the symptoms worsen or if the condition fails to improve as anticipated.  The above assessment and management plan was discussed with the patient. The patient verbalized understanding of and has agreed to the management plan. Patient is aware to call the clinic if symptoms persist or worsen. Patient is aware when to return to the clinic for a follow-up visit. Patient educated on when it is appropriate to go to the emergency department.   Time call ended: 1:32 PM  I provided 7 minutes of  non face-to-face time during this encounter.    Ivy Lynn, NP

## 2021-07-17 DIAGNOSIS — G894 Chronic pain syndrome: Secondary | ICD-10-CM | POA: Diagnosis not present

## 2021-07-17 DIAGNOSIS — M542 Cervicalgia: Secondary | ICD-10-CM | POA: Diagnosis not present

## 2021-07-17 DIAGNOSIS — M4322 Fusion of spine, cervical region: Secondary | ICD-10-CM | POA: Diagnosis not present

## 2021-07-24 ENCOUNTER — Encounter: Payer: Self-pay | Admitting: General Surgery

## 2021-07-24 ENCOUNTER — Other Ambulatory Visit: Payer: Self-pay

## 2021-07-24 ENCOUNTER — Ambulatory Visit (INDEPENDENT_AMBULATORY_CARE_PROVIDER_SITE_OTHER): Payer: Medicare HMO | Admitting: General Surgery

## 2021-07-24 VITALS — BP 135/84 | HR 95 | Temp 98.5°F | Resp 14 | Ht 64.0 in | Wt 180.0 lb

## 2021-07-24 DIAGNOSIS — K439 Ventral hernia without obstruction or gangrene: Secondary | ICD-10-CM | POA: Diagnosis not present

## 2021-07-24 DIAGNOSIS — K5900 Constipation, unspecified: Secondary | ICD-10-CM | POA: Diagnosis not present

## 2021-07-24 NOTE — Patient Instructions (Addendum)
Drink at least 64 ounces of water daily. Take metamucil/ benefiber twice daily. Take milk of mag every other day to ensure you are going. Let Lovey Newcomer know about the CT approval letter. We want to get a CT with rectal contrast.  Will call with results. Please let us know if your bowels are not getting better.

## 2021-07-24 NOTE — Progress Notes (Signed)
[facility] Note   HPI:  60 y.o. Female presents to clinic for post-op follow-up evaluation after colostomy reversal. Patient reports doing poorly and having issues with constipation. She has a hernia on the left side at the colostomy place.  Review of Systems:  No fevers or chills constipation All other review of systems: otherwise negative   Vital Signs:  BP 135/84   Pulse 95   Temp 98.5 F (36.9 C) (Other (Comment))   Resp 14   Ht '5\' 4"'$  (1.626 m)   Wt 180 lb (81.6 kg)   SpO2 96%   BMI 30.90 kg/m    Physical Exam:  Physical Exam Vitals reviewed.  Constitutional:      Appearance: Normal appearance.  HENT:     Head: Normocephalic.  Cardiovascular:     Rate and Rhythm: Normal rate.  Pulmonary:     Effort: Pulmonary effort is normal.  Abdominal:     General: There is no distension.     Palpations: Abdomen is soft.     Tenderness: There is abdominal tenderness.     Hernia: A hernia is present.     Comments: Left colostomy site with hernia  Skin:    General: Skin is warm.  Neurological:     General: No focal deficit present.      Assessment:  60 y.o. yo Female with ventral hernia at the colostomy site, constipation. She had a colostomy reversal and could have constipation related to a stricture versus just functional constipation. She has been on laxative at times and MOM. She has not tried any fiber. She reports drinking a lot of water daily.  Plan:  Drink at least 64 ounces of water daily. Take metamucil/ benefiber twice daily. Take milk of mag every other day to ensure you are going. Let Lovey Newcomer know about the CT approval letter. We want to get a CT with rectal contrast.  Will call with results. Please let us know if your bowels are not getting better.   [name], MD [facility] [address] [place], [place] [address] [phone] (office)

## 2021-07-26 ENCOUNTER — Other Ambulatory Visit: Payer: Self-pay

## 2021-07-26 ENCOUNTER — Ambulatory Visit
Admission: RE | Admit: 2021-07-26 | Discharge: 2021-07-26 | Disposition: A | Discharge status: Home / Self Care | Payer: Medicare HMO | Source: Ambulatory Visit | Attending: Family Medicine | Admitting: Family Medicine

## 2021-07-26 ENCOUNTER — Ambulatory Visit: Admission: RE | Admit: 2021-07-26 | Payer: Medicare HMO | Source: Ambulatory Visit

## 2021-07-26 DIAGNOSIS — R928 Other abnormal and inconclusive findings on diagnostic imaging of breast: Secondary | ICD-10-CM | POA: Diagnosis not present

## 2021-07-26 DIAGNOSIS — R922 Inconclusive mammogram: Secondary | ICD-10-CM | POA: Diagnosis not present

## 2021-07-30 ENCOUNTER — Other Ambulatory Visit: Payer: Self-pay | Admitting: Family Medicine

## 2021-07-30 DIAGNOSIS — R140 Abdominal distension (gaseous): Secondary | ICD-10-CM

## 2021-07-30 DIAGNOSIS — R748 Abnormal levels of other serum enzymes: Secondary | ICD-10-CM

## 2021-07-30 DIAGNOSIS — K5900 Constipation, unspecified: Secondary | ICD-10-CM

## 2021-07-30 DIAGNOSIS — Z9889 Other specified postprocedural states: Secondary | ICD-10-CM

## 2021-07-30 DIAGNOSIS — K439 Ventral hernia without obstruction or gangrene: Secondary | ICD-10-CM

## 2021-07-30 DIAGNOSIS — R1032 Left lower quadrant pain: Secondary | ICD-10-CM

## 2021-07-30 NOTE — Addendum Note (Signed)
Addended by: [name] on: 07/30/2021 11:01 AM   Modules accepted: Orders

## 2021-08-04 ENCOUNTER — Other Ambulatory Visit: Payer: Self-pay

## 2021-08-04 ENCOUNTER — Other Ambulatory Visit: Payer: Medicare HMO

## 2021-08-04 DIAGNOSIS — R7989 Other specified abnormal findings of blood chemistry: Secondary | ICD-10-CM

## 2021-08-04 LAB — CBC WITH DIFFERENTIAL/PLATELET
Basophils Absolute: 0.1 10*3/uL (ref 0.0–0.2)
Basos: 1 %
EOS (ABSOLUTE): 0.6 10*3/uL — ABNORMAL HIGH (ref 0.0–0.4)
Eos: 7 %
Hematocrit: 47 % — ABNORMAL HIGH (ref 34.0–46.6)
Hemoglobin: 15.5 g/dL (ref 11.1–15.9)
Immature Grans (Abs): 0.1 10*3/uL (ref 0.0–0.1)
Immature Granulocytes: 1 %
Lymphocytes Absolute: 2.8 10*3/uL (ref 0.7–3.1)
Lymphs: 34 %
MCH: 28.1 pg (ref 26.6–33.0)
MCHC: 33 g/dL (ref 31.5–35.7)
MCV: 85 fL (ref 79–97)
Monocytes Absolute: 0.8 10*3/uL (ref 0.1–0.9)
Monocytes: 9 %
Neutrophils Absolute: 4.1 10*3/uL (ref 1.4–7.0)
Neutrophils: 48 %
Platelets: 316 10*3/uL (ref 150–450)
RBC: 5.51 x10E6/uL — ABNORMAL HIGH (ref 3.77–5.28)
RDW: 13.6 % (ref 11.7–15.4)
WBC: 8.4 10*3/uL (ref 3.4–10.8)

## 2021-08-07 ENCOUNTER — Other Ambulatory Visit: Payer: Self-pay

## 2021-08-07 ENCOUNTER — Ambulatory Visit (HOSPITAL_COMMUNITY)
Admission: RE | Admit: 2021-08-07 | Discharge: 2021-08-07 | Disposition: A | Discharge status: Home / Self Care | Payer: Medicare HMO | Source: Ambulatory Visit | Attending: Family Medicine | Admitting: Family Medicine

## 2021-08-07 DIAGNOSIS — R1032 Left lower quadrant pain: Secondary | ICD-10-CM | POA: Diagnosis not present

## 2021-08-07 DIAGNOSIS — R109 Unspecified abdominal pain: Secondary | ICD-10-CM | POA: Diagnosis not present

## 2021-08-07 LAB — POCT I-STAT CREATININE: Creatinine, Ser: 0.8 mg/dL (ref 0.44–1.00)

## 2021-08-07 MED ORDER — IOHEXOL 300 MG/ML  SOLN
100.0000 mL | Freq: Once | INTRAMUSCULAR | Status: AC | PRN
  Administered 2021-08-07: 100 mL via INTRAVENOUS

## 2021-08-08 ENCOUNTER — Other Ambulatory Visit: Payer: Self-pay | Admitting: Family Medicine

## 2021-08-08 DIAGNOSIS — D75839 Thrombocytosis, unspecified: Secondary | ICD-10-CM

## 2021-08-11 ENCOUNTER — Other Ambulatory Visit: Payer: Self-pay | Admitting: Family Medicine

## 2021-08-11 ENCOUNTER — Encounter: Payer: Self-pay | Admitting: Family Medicine

## 2021-08-11 DIAGNOSIS — K5909 Other constipation: Secondary | ICD-10-CM

## 2021-08-11 DIAGNOSIS — Z9889 Other specified postprocedural states: Secondary | ICD-10-CM

## 2021-08-11 MED ORDER — TRULANCE 3 MG PO TABS: 3.0000 mg | ORAL_TABLET | Freq: Every day | ORAL | 12 refills | Status: DC

## 2021-08-21 DIAGNOSIS — Z79891 Long term (current) use of opiate analgesic: Secondary | ICD-10-CM | POA: Diagnosis not present

## 2021-08-21 DIAGNOSIS — G894 Chronic pain syndrome: Secondary | ICD-10-CM | POA: Diagnosis not present

## 2021-08-21 DIAGNOSIS — M542 Cervicalgia: Secondary | ICD-10-CM | POA: Diagnosis not present

## 2021-08-21 DIAGNOSIS — M4322 Fusion of spine, cervical region: Secondary | ICD-10-CM | POA: Diagnosis not present

## 2021-08-21 DIAGNOSIS — Z79899 Other long term (current) drug therapy: Secondary | ICD-10-CM | POA: Diagnosis not present

## 2021-08-21 DIAGNOSIS — Z6831 Body mass index (BMI) 31.0-31.9, adult: Secondary | ICD-10-CM | POA: Diagnosis not present

## 2021-08-25 ENCOUNTER — Encounter: Payer: Self-pay | Admitting: Physician Assistant

## 2021-08-25 ENCOUNTER — Ambulatory Visit: Payer: Medicare HMO | Admitting: Physician Assistant

## 2021-08-25 VITALS — BP 104/76 | HR 88 | Ht 64.0 in | Wt 184.5 lb

## 2021-08-25 DIAGNOSIS — K439 Ventral hernia without obstruction or gangrene: Secondary | ICD-10-CM

## 2021-08-25 DIAGNOSIS — R1084 Generalized abdominal pain: Secondary | ICD-10-CM | POA: Diagnosis not present

## 2021-08-25 DIAGNOSIS — K5909 Other constipation: Secondary | ICD-10-CM

## 2021-08-25 NOTE — Progress Notes (Signed)
Subjective:    Patient ID: [name], female    DOB: Apr 25, 1961, 60 y.o.   MRN: [id number]  HPI Betty "Barbera Setters" is a pleasant 60 year old white female, established with Dr. Ardis Hughs who was last seen in our office in May 2022.  She comes in today with complaints of left-sided abdominal pain which she feels is secondary to an abdominal wall hernia.  Patient is status post cholecystectomy and hysterectomy remotely.  She required emergency surgery in February 2022 done per Dr. Blake Divine at [facility] for pneumoperitoneum found secondary to stercoral ulceration of the sigmoid colon.  She required partial colectomy with creation of colostomy/Hartmann procedure. When she was seen here in May 2022 she underwent colonoscopy, prior to having the colostomy reversed.  At colonoscopy she had a normal-appearing Hartmann pouch and normal colon via the ostomy. She underwent colostomy reversal in June 2022.  Sometime thereafter she developed the left mid left lower quadrant abdominal pain which she says has been present ever since and is constant.  She says this is more bothersome at nighttime.  She has not noticed any change in symptoms with p.o. intake.  She has had chronic problems with constipation long-term she is currently taking MiraLAX 17 g in 8 ounces of water daily and was started on Linzess 145 mcg daily a week ago by her PCP.  She will usually go a couple of days between bowel movements and feels that she is still not having good stools.  He has not had any rectal bleeding.  She gets discomfort with certain movements, coughing etc. in addition to the persistent lower level pain. Patient expresses that she does not wish to go back to see Dr. Blake Divine and would like referral to [facility] surgery in [place].  She had her cholecystectomy done by Dr. Barry Dienes. CT of the abdomen pelvis done on 07/30/2021 shows large amount of stool in the colon, left abdominal wall hernia at the colostomy  site containing part of the transverse colon, no obstruction the presacral fluid collection has decreased in size consistent with resolving hematoma Review of Systems Pertinent positive and negative review of systems were noted in the above HPI section.  All other review of systems was otherwise negative.   Outpatient Encounter Medications as of 08/25/2021  Medication Sig   amitriptyline (ELAVIL) 25 MG tablet Take 25-50 mg by mouth See admin instructions. Take 50 mg by mouth at bedtime, may take 25 mg during the day if needed for pain   atenolol (TENORMIN) 50 MG tablet Take 1 tablet (50 mg total) by mouth daily.   busPIRone (BUSPAR) 15 MG tablet Take 1 tablet (15 mg total) by mouth 3 (three) times daily.   busPIRone (BUSPAR) 5 MG tablet TAKE 1 TABLET (5 MG TOTAL) BY MOUTH 3 (THREE) TIMES DAILY. TAKE 1 TABLET WITH THE 15MG  TO EQUAL 20MG .   cetirizine (ZYRTEC) 10 MG tablet Take 10 mg by mouth daily.   citalopram (CELEXA) 40 MG tablet Take 1 tablet (40 mg total) by mouth daily with breakfast.   famotidine (PEPCID) 20 MG tablet Take 1 tablet (20 mg total) by mouth daily as needed for heartburn or indigestion.   latanoprost (XALATAN) 0.005 % ophthalmic solution Place 1 drop into both eyes at bedtime.   linaclotide (LINZESS) 145 MCG CAPS capsule Take 1 capsule (145 mcg total) by mouth daily before breakfast.   lisinopril-hydrochlorothiazide (ZESTORETIC) 20-25 MG tablet TAKE 1 TABLET BY MOUTH EVERY DAY   ondansetron (ZOFRAN) 4 MG  tablet Take 4 mg by mouth every 8 (eight) hours as needed for nausea.   Oxycodone HCl 10 MG TABS Take 1 tablet (10 mg total) by mouth 2 (two) times daily.   polyethylene glycol powder (MIRALAX) 17 GM/SCOOP powder Take 17 g by mouth in the morning and at bedtime.   tiZANidine (ZANAFLEX) 4 MG tablet Take 4 mg by mouth every 8 (eight) hours as needed for muscle spasms.   traZODone (DESYREL) 100 MG tablet TAKE 1 AND 1/2 TABLET (150 MG TOTAL) BY MOUTH AT BEDTIME AS NEEDED FOR SLEEP.    nitroGLYCERIN (NITROSTAT) 0.4 MG SL tablet Place 1 tablet (0.4 mg total) under the tongue every 5 (five) minutes as needed for chest pain.   [DISCONTINUED] hydrochlorothiazide (HYDRODIURIL) 25 MG tablet Take 25 mg by mouth daily.   [DISCONTINUED] nystatin (MYCOSTATIN) 100000 UNIT/ML suspension Take 5 mLs (500,000 Units total) by mouth 4 (four) times daily.   No facility-administered encounter medications on file as of 08/25/2021.   Allergies  Allergen Reactions   Nsaids Nausea Only    Abdominal pain   Patient Active Problem List   Diagnosis Date Noted   Ventral hernia without obstruction or gangrene 07/24/2021   Oral thrush 07/15/2021   Acute ITP (HCC) 05/08/2021   Elevated alkaline phosphatase level 05/08/2021   History of colostomy reversal 05/08/2021   Acute Thrombocytopenia - HIT negative 05/04/2021   Acute blood loss anemia (ABLA) 05/04/2021   History of ITP 05/04/2021   Anxiety 05/04/2021   Hx of fusion of cervical spine 05/04/2021   Low vitamin D level 05/04/2021   Scoliosis 05/04/2021   Lumbar disc disorder with myelopathy 05/04/2021   Spinal stenosis 05/04/2021   Parastomal hernia without obstruction or gangrene 04/18/2021   Colostomy in place [facility]) 01/09/2021   Abscess, intra-abdominal, postoperative 12/31/2020   Aspiration pneumonia of both lower lobes due to gastric secretions ([place]) 12/22/2020   Under care of pain management specialist 05/30/2020   Benzodiazepine dependence ([place]) 08/08/2018   Controlled substance agreement broken 08/08/2018   Hyperlipidemia 08/05/2017   Chest pain 06/30/2017   AKI (acute kidney injury) ([place]) 07/09/2016   Hypokalemia 07/09/2016   Obesity (BMI 30-39.9) 07/02/2016   Osteopenia 12/20/2013   Constipation 02/04/2012   Diverticulosis of colon (without mention of hemorrhage) 02/04/2012   GERD (gastroesophageal reflux disease) 02/04/2012   Dysphagia 02/04/2012   Unspecified gastritis and gastroduodenitis without mention of hemorrhage  02/04/2012   Cystic disease of liver 01/26/2012   Cholecystitis chronic 01/26/2012   SYNCOPE 12/20/2007   GAD (generalized anxiety disorder) 01/25/2007   Depression 01/25/2007   Essential hypertension 01/25/2007   BREAST MASSES, BILATERAL 01/25/2007   DEGENERATIVE DISC DISEASE, CERVICAL SPINE 01/25/2007   LOW BACK PAIN 01/25/2007   Epilepsy ([place]) 01/25/2007   Insomnia 01/25/2007   DEPENDENT EDEMA, LEGS, BILATERAL 01/25/2007   Smoker 01/25/2007   TAH/BSO, HX OF 01/25/2007   Social History   Socioeconomic History   Marital status: Married    Spouse name: Legrand Como   Number of children: 3   Years of education: 14   Highest education level: Associate degree: academic program  Occupational History   Occupation: Corporate investment banker: UNEMPLOYED  Tobacco Use   Smoking status: Every Day    Packs/day: 1.00    Years: 12.00    Pack years: 12.00    Types: Cigarettes   Smokeless tobacco: Never  Vaping Use   Vaping Use: Former   Quit date: 09/07/2016   Devices: only used for about  a month  Substance and Sexual Activity   Alcohol use: No    Alcohol/week: 0.0 standard drinks   Drug use: Yes    Types: Marijuana   Sexual activity: Yes  Other Topics Concern   Not on file  Social History Narrative   She lives with her boyfriend.  She used to smoke, quit May 2013, she is not drinking. She has 3 children   Social Determinants of Radio broadcast assistant Strain: Not on file  Food Insecurity: Not on file  Transportation Needs: Not on file  Physical Activity: Not on file  Stress: Not on file  Social Connections: Not on file  Intimate Partner Violence: Not on file    Ms. Prisco's family history includes Bipolar disorder in her sister; Cancer in her father and paternal grandmother; Colon cancer in her paternal grandmother; Diabetes in her sister; Heart disease in her maternal grandmother, mother, and sister; Hypertension in her mother and sister; Lung cancer in her father.       Objective:    Vitals:   08/25/21 1402  BP: 104/76  Pulse: 88    Physical Exam Well-developed well-nourished white female in no acute distress.  Weight, 184 BMI 31.67  HEENT; nontraumatic normocephalic, EOMI, PE R LA, sclera anicteric. Oropharynx; not examined today Neck; supple, no JVD Cardiovascular; regular rate and rhythm with S1-S2, no murmur rub or gallop Pulmonary; Clear bilaterally Abdomen; soft, obese, midline incisional scar, ostomy scar in the left mid quadrant where there is a large palpable hernia in the area of the previous ostomy about the size of a baseball this is slightly tender with pressure ,no palpable mass or hepatosplenomegaly, bowel sounds are active Rectal; not done Skin; benign exam, no jaundice rash or appreciable lesions Extremities; no clubbing cyanosis or edema skin warm and dry Neuro/Psych; alert and oriented x4, grossly nonfocal mood and affect appropriate        Assessment & Plan:   #11 61 year old white female with history of colon perforation secondary to stercoral ulceration of the sigmoid colon with peritonitis February 2022 requiring partial colectomy and colostomy.  Patient had colostomy reversal June 2022 and has now developed left lower quadrant pain and protrusion below the ostomy incisional scar.  Continued significant constipation  Recent CT on 07/30/2021 confirms constipation and a large left abdominal wall hernia at the previous colostomy site containing part of the transverse colon, no obstruction  #2 status post prior TAH/BSO and cholecystectomy #3 status post previous resection of hepatic hamartoma 2013 #4 history of ITP #5 lumbar disc disease #6 GERD  Plan; patient will do a MiraLAX purge for severe constipation then start MiraLAX 17 g in 8 ounces of water twice daily in addition to continuing Linzess 145 mcg daily. She had been using as needed famotidine we will change to famotidine 40 mg once daily, have sent prescription  We  will obtain appointment for patient with [facility]/Dr. Barry Dienes or associate for repair of large symptomatic abdominal wall hernia.    Jnae Thomaston S Lainie Daubert PA-C 08/25/2021   Cc: [name], DO

## 2021-08-25 NOTE — Patient Instructions (Addendum)
If you are age 62 or younger, your body mass index should be between 19-25. Your Body mass index is 31.67 kg/m. If this is out of the aformentioned range listed, please consider follow up with your Primary Care Provider.  __________________________________________________________  The [facility] providers would like to encourage you to use [facility] to communicate with providers for non-urgent requests or questions.  Due to long hold times on the telephone, sending your provider a message by [facility] may be a faster and more efficient way to get a response.  Please allow 48 business hours for a response.  Please remember that this is for non-urgent requests.   Call [facility] [phone] to schedule an appointment with Dr. Varney Daily, PA-C recommends that you complete a bowel purge (to clean out your bowels). Please do the following: Purchase a bottle of Miralax over the counter as well as a box of 5 mg dulcolax tablets. Take 4 dulcolax tablets. Wait 1 hour. You will then drink 6-8 capfuls of Miralax mixed in an adequate amount of water/juice/gatorade (you may choose which of these liquids to drink) over the next 2-3 hours. You should expect results within 1 to 6 hours after completing the bowel purge.  Once you have completed your bowel purge, continue 1 capful in 8 ounces of water/juice daily.  Continue daily Linzess  Follow a bland diet/low roughage diet.  Follow up as needed.  Thank you for entrusting me with your care and choosing [facility].  Amy Esterwood, PA-C

## 2021-08-26 NOTE — Progress Notes (Signed)
I agree with the above note, plan 

## 2021-08-29 ENCOUNTER — Other Ambulatory Visit: Payer: Self-pay | Admitting: Family Medicine

## 2021-08-29 DIAGNOSIS — I10 Essential (primary) hypertension: Secondary | ICD-10-CM

## 2021-08-30 ENCOUNTER — Other Ambulatory Visit: Payer: Self-pay | Admitting: Family Medicine

## 2021-08-30 DIAGNOSIS — F411 Generalized anxiety disorder: Secondary | ICD-10-CM

## 2021-09-01 ENCOUNTER — Ambulatory Visit: Payer: Medicare HMO | Admitting: Registered Nurse

## 2021-09-05 ENCOUNTER — Ambulatory Visit: Payer: Medicare HMO | Admitting: Family Medicine

## 2021-09-08 ENCOUNTER — Other Ambulatory Visit: Payer: Self-pay

## 2021-09-08 ENCOUNTER — Ambulatory Visit (INDEPENDENT_AMBULATORY_CARE_PROVIDER_SITE_OTHER): Payer: Medicare HMO | Admitting: Family Medicine

## 2021-09-08 ENCOUNTER — Encounter: Payer: Self-pay | Admitting: Family Medicine

## 2021-09-08 ENCOUNTER — Other Ambulatory Visit: Payer: Self-pay | Admitting: Family Medicine

## 2021-09-08 VITALS — BP 124/83 | HR 85 | Temp 97.7°F | Ht 64.0 in | Wt 182.6 lb

## 2021-09-08 DIAGNOSIS — D75839 Thrombocytosis, unspecified: Secondary | ICD-10-CM

## 2021-09-08 DIAGNOSIS — N281 Cyst of kidney, acquired: Secondary | ICD-10-CM | POA: Diagnosis not present

## 2021-09-08 DIAGNOSIS — E559 Vitamin D deficiency, unspecified: Secondary | ICD-10-CM | POA: Diagnosis not present

## 2021-09-08 DIAGNOSIS — Z0001 Encounter for general adult medical examination with abnormal findings: Secondary | ICD-10-CM

## 2021-09-08 DIAGNOSIS — R739 Hyperglycemia, unspecified: Secondary | ICD-10-CM | POA: Diagnosis not present

## 2021-09-08 DIAGNOSIS — F411 Generalized anxiety disorder: Secondary | ICD-10-CM

## 2021-09-08 DIAGNOSIS — Z0000 Encounter for general adult medical examination without abnormal findings: Secondary | ICD-10-CM

## 2021-09-08 DIAGNOSIS — I10 Essential (primary) hypertension: Secondary | ICD-10-CM

## 2021-09-08 DIAGNOSIS — R0789 Other chest pain: Secondary | ICD-10-CM | POA: Diagnosis not present

## 2021-09-08 DIAGNOSIS — E782 Mixed hyperlipidemia: Secondary | ICD-10-CM | POA: Diagnosis not present

## 2021-09-08 DIAGNOSIS — R6889 Other general symptoms and signs: Secondary | ICD-10-CM | POA: Diagnosis not present

## 2021-09-08 DIAGNOSIS — F331 Major depressive disorder, recurrent, moderate: Secondary | ICD-10-CM

## 2021-09-08 DIAGNOSIS — K432 Incisional hernia without obstruction or gangrene: Secondary | ICD-10-CM | POA: Diagnosis not present

## 2021-09-08 LAB — BAYER DCA HB A1C WAIVED: HB A1C (BAYER DCA - WAIVED): 5.8 % — ABNORMAL HIGH (ref 4.8–5.6)

## 2021-09-08 MED ORDER — HYDROCHLOROTHIAZIDE 25 MG PO TABS: 25.0000 mg | ORAL_TABLET | Freq: Every day | ORAL | 3 refills | Status: DC

## 2021-09-08 MED ORDER — LISINOPRIL 10 MG PO TABS: 5.0000 mg | ORAL_TABLET | Freq: Every day | ORAL | 3 refills | Status: DC

## 2021-09-08 NOTE — Progress Notes (Signed)
[name] is a 60 y.o. female, who is accompanied by her twin sister today.  She presents to office today for annual physical exam examination.    Concerns today include: 1. Atypical chest pain/ hernia Reports some atypical chest pain.  She points to the bilateral diaphragms as the area of pain but notes sometimes this does radiate to her back.  No reports of vomiting but she has had some gradually advancing nausea over the last couple of weeks.  She notes that this seems to be related to the left lower quadrant incisional hernia and she has an appointment with the surgeon, Dr. Rosendo Gros, later this week to discuss this further.  She does not report any blood in stool but does note ongoing chronic constipation that is moderately relieved by Linzess 145 and MiraLAX.  2.  Hypertension with hyperlipidemia Patient reports compliance with half a tablet of lisinopril-hydrochlorothiazide.  She had to reduce this because her blood pressure dropped to 67J systolic.  She did take half a tablet today and blood pressure is normal.  Diet: eats everyting, Exercise: no structured Last colonoscopy: UTD; 03/2021 Last mammogram: UTD; 07/26/21 Last pap smear: needs Refills needed today: na Immunizations needed: Immunization History  Administered Date(s) Administered   Influenza,inj,Quad PF,6+ Mos 10/06/2016, 08/05/2017, 08/08/2018, 09/06/2020   Moderna Sars-Covid-2 Vaccination 02/19/2020, 03/18/2020, 10/08/2020   Pneumococcal Conjugate-13 10/06/2016   Tdap 02/04/2018   Zoster Recombinat (Shingrix) 09/06/2018     Past Medical History:  Diagnosis Date   Abdominal pain    Allergy    seasonal   Anxiety    Arthritis    Blood in stool    Breast changes, fibrocystic    Bruises easily    Cardiomegaly 2018   Clotting disorder ([place])     itp , none since 1976, no current hematologist   Colon perforation due to stercoral ulcer with E. coli fecal peritonitis    Constipation    DDD (degenerative disc  disease), cervical    with lumbar issues   Depression    E. coli sepsis -due to fecal peritonitis 12/23/2020   Generalized headaches    GERD (gastroesophageal reflux disease)    Glaucoma    Hemorrhoid    Hyperlipidemia    Hypertension    Idiopathic thrombocytopenia ([place])    as child   Leg swelling    both ankles   Liver lesion    Nasal congestion    Nausea & vomiting    Neuromuscular disorder ([place])    back  injury   Neuromuscular disorder ([place])    3 neck surgeries,neck fusion.pin,plates,screws   Osteoporosis    Panic attacks    pt also  has germaphobia   Rectal bleeding    Seizures ([place]) 1998   stress induced, one time, none since, no seizure meds   Seizures ([place])    Stated she had 2 weeks ago,it was like a transient,stare.04/01/21 pt. denies   Spleen enlarged 1976, none since   r/t idiopathic thrombocytopenia   Stercoral ulcer of large intestine 12/21/2020   Trouble swallowing    Unintentional weight loss    Weakness    weakness varies   Social History   Socioeconomic History   Marital status: Married    Spouse name: Legrand Como   Number of children: 3   Years of education: 14   Highest education level: Associate degree: academic program  Occupational History   Occupation: Corporate investment banker: UNEMPLOYED  Tobacco Use   Smoking status:  Every Day    Packs/day: 1.00    Years: 12.00    Pack years: 12.00    Types: Cigarettes   Smokeless tobacco: Never  Vaping Use   Vaping Use: Former   Quit date: 09/07/2016   Devices: only used for about a month  Substance and Sexual Activity   Alcohol use: No    Alcohol/week: 0.0 standard drinks   Drug use: Yes    Types: Marijuana   Sexual activity: Yes  Other Topics Concern   Not on file  Social History Narrative   She lives with her boyfriend.  She used to smoke, quit May 2013, she is not drinking. She has 3 children   Social Determinants of Health   Financial Resource Strain: Not on file  Food Insecurity: Not on file   Transportation Needs: Not on file  Physical Activity: Not on file  Stress: Not on file  Social Connections: Not on file  Intimate Partner Violence: Not on file   Past Surgical History:  Procedure Laterality Date   [place]  2005, 2007, 2010   x3, fusion done,plates and screws present   [place]  03/14/2012   Procedure: LAPAROSCOPIC CHOLECYSTECTOMY;  Surgeon: Stark Klein, MD;  Location: [facility] ORS;  Service: General;  Laterality: N/A;   COLECTOMY WITH COLOSTOMY CREATION/HARTMANN PROCEDURE N/A 12/21/2020   Procedure: COLECTOMY WITH COLOSTOMY CREATION/HARTMANN PROCEDURE;  Surgeon: Virl Cagey, MD;  Location: [facility] ORS;  Service: General;  Laterality: N/A;   colonscopy and esophagogastrodudononescopy  02-04-12   COLOSTOMY REVERSAL N/A 04/28/2021   Procedure: COLOSTOMY REVERSAL;  Surgeon: Virl Cagey, MD;  Location: [facility] ORS;  Service: General;  Laterality: N/A;   IR RADIOLOGIST EVAL & MGMT  01/15/2021   LUMBAR [place] SURGERY  11/2014   Family History  Problem Relation Age of Onset   Hypertension Mother    Heart disease Mother    Lung cancer Father    Cancer Father        lung   Diabetes Sister    Bipolar disorder Sister    Heart disease Sister        Congestive Heart Failure   Hypertension Sister    Heart disease Maternal Grandmother    Colon cancer Paternal Grandmother    Cancer Paternal Grandmother        colon   Esophageal cancer Neg Hx    Rectal cancer Neg Hx    Stomach cancer Neg Hx    Breast cancer Neg Hx     Current Outpatient Medications:    amitriptyline (ELAVIL) 25 MG tablet, Take 25-50 mg by mouth See admin instructions. Take 50 mg by mouth at bedtime, may take 25 mg during the day if needed for pain, Disp: , Rfl:    atenolol (TENORMIN) 50 MG tablet, TAKE 1 TABLET BY MOUTH EVERY DAY, Disp: 90 tablet, Rfl: 0   busPIRone (BUSPAR) 15 MG tablet, Take 1 tablet (15 mg  total) by mouth 3 (three) times daily. TAKE 1 TABLET WITH THE 5MG TO EQUAL 20MG., Disp: 270 tablet, Rfl: 0   busPIRone (BUSPAR) 5 MG tablet, TAKE 1 TABLET (5 MG TOTAL) BY MOUTH 3 (THREE) TIMES DAILY. TAKE 1 TABLET WITH THE 15MG TO EQUAL 20MG., Disp: 270 tablet, Rfl: 0   cetirizine (ZYRTEC) 10 MG tablet, Take 10 mg by mouth daily., Disp: , Rfl:    citalopram (CELEXA) 40 MG  tablet, Take 1 tablet (40 mg total) by mouth daily with breakfast., Disp: 90 tablet, Rfl: 3   famotidine (PEPCID) 20 MG tablet, Take 1 tablet (20 mg total) by mouth daily as needed for heartburn or indigestion., Disp: 30 tablet, Rfl:    latanoprost (XALATAN) 0.005 % ophthalmic solution, Place 1 drop into both eyes at bedtime., Disp: , Rfl:    linaclotide (LINZESS) 145 MCG CAPS capsule, Take 1 capsule (145 mcg total) by mouth daily before breakfast., Disp: 30 capsule, Rfl: 12   lisinopril-hydrochlorothiazide (ZESTORETIC) 20-25 MG tablet, TAKE 1 TABLET BY MOUTH EVERY DAY, Disp: 90 tablet, Rfl: 2   ondansetron (ZOFRAN) 4 MG tablet, Take 4 mg by mouth every 8 (eight) hours as needed for nausea., Disp: , Rfl:    Oxycodone HCl 10 MG TABS, Take 1 tablet (10 mg total) by mouth 2 (two) times daily., Disp: , Rfl: 0   polyethylene glycol powder (MIRALAX) 17 GM/SCOOP powder, Take 17 g by mouth in the morning and at bedtime., Disp: 225 g, Rfl: PRN   tiZANidine (ZANAFLEX) 4 MG tablet, Take 4 mg by mouth every 8 (eight) hours as needed for muscle spasms., Disp: , Rfl:    traZODone (DESYREL) 100 MG tablet, TAKE 1 AND 1/2 TABLET (150 MG TOTAL) BY MOUTH AT BEDTIME AS NEEDED FOR SLEEP., Disp: 135 tablet, Rfl: 2   nitroGLYCERIN (NITROSTAT) 0.4 MG SL tablet, Place 1 tablet (0.4 mg total) under the tongue every 5 (five) minutes as needed for chest pain., Disp: 25 tablet, Rfl: 3  Allergies  Allergen Reactions   Nsaids Nausea Only    Abdominal pain     ROS: Review of Systems Pertinent items noted in HPI and remainder of comprehensive ROS otherwise  negative.    Physical exam BP 124/83   Pulse 85   Temp 97.7 F (36.5 C)   Ht '5\' 4"'  (1.626 m)   Wt 182 lb 9.6 oz (82.8 kg)   SpO2 97%   BMI 31.34 kg/m  General appearance: alert, cooperative, appears stated age, and no distress Head: Normocephalic, without obvious abnormality, atraumatic Eyes: negative findings: lids and lashes normal, conjunctivae and sclerae normal, corneas clear, and pupils equal, round, reactive to light and accomodation Ears: normal TM's and external ear canals both ears Nose: Nares normal. Septum midline. Mucosa normal. No drainage or sinus tenderness. Throat: lips, mucosa, and tongue normal; teeth and gums normal Neck: no adenopathy, supple, symmetrical, trachea midline, and thyroid not enlarged, symmetric, no tenderness/mass/nodules Back: symmetric, no curvature. ROM normal. No CVA tenderness. Lungs: clear to auscultation bilaterally Heart: regular rate and rhythm, S1, S2 normal, no murmur, click, rub or gallop Abdomen:  Incisional hernia noted at the left lower quadrant.  This is approximately softball sized.  No guarding Extremities: extremities normal, atraumatic, no cyanosis or edema Pulses: 2+ and symmetric Skin: Skin color, texture, turgor normal. No rashes or lesions Lymph nodes: Cervical, supraclavicular, and axillary nodes normal. Neurologic: Grossly normal Psych: Somewhat anxious appearing  Flowsheet Row Office Visit from 09/08/2021 in [facility]  PHQ-2 Total Score 0      Assessment/ Plan: [name] here for annual physical exam.   Annual physical exam  Atypical chest pain - Plan: EKG 12-Lead, CMP14+EGFR, DG Chest 2 View, Bayer DCA Hb A1c Waived, Lipid panel  Mixed hyperlipidemia - Plan: Bayer DCA Hb A1c Waived, Lipid panel  Essential hypertension - Plan: Lipid panel, hydrochlorothiazide (HYDRODIURIL) 25 MG tablet, lisinopril (ZESTRIL) 10 MG tablet  Incisional hernia, without  obstruction or  gangrene  Thrombocytosis - Plan: CMP14+EGFR, CBC with Differential, Iron, Ferritin, Vitamin B12  Vitamin D deficiency - Plan: VITAMIN D 25 Hydroxy (Vit-D Deficiency, Fractures)  Cyst of right kidney - Plan: Ambulatory referral to Urology  Needs Pap smear but will defer to another appointment as we had many other things that we need to address today.  Atypical chest pain that I suspect may be related to the hernia as below.  Her EKG without evidence of ischemia.  Chest x-ray ordered though lung fields sounded normal and most recent CT abdomen pelvis demonstrated normal-appearing lung fields in the lower chest.  A1c, lipid panel.  Patient is fasting  Blood pressure is at goal.  Given her reports of hypotension I have advised her to use hydrochlorothiazide exclusively and may utilize a 5 to 10 mg of lisinopril if blood pressures are noted to be elevated above 140 over 90s.  Incisional hernia seems to be getting larger and more concerning.  Agree with surgical evaluation for consideration of repair.  Last few platelet levels were normal.  Check CBC, iron, ferritin and B12 levels  History of vitamin D deficiency.  We have collected vitamin D today.  Plan for DEXA at a future date as our x-ray tech was not available for this  Referral to urology placed for further evaluation of cyst on her right kidney.  Counseled on healthy lifestyle choices, including diet (rich in fruits, vegetables and lean meats and low in salt and simple carbohydrates) and exercise (at least 30 minutes of moderate physical activity daily).  Patient to follow up in 1 year for annual exam or sooner if needed.  Syrenity Klepacki M. Lajuana Ripple, DO

## 2021-09-09 ENCOUNTER — Other Ambulatory Visit: Payer: Self-pay | Admitting: Family Medicine

## 2021-09-09 LAB — CMP14+EGFR
ALT: 96 IU/L — ABNORMAL HIGH (ref 0–32)
AST: 91 IU/L — ABNORMAL HIGH (ref 0–40)
Albumin/Globulin Ratio: 1.4 (ref 1.2–2.2)
Albumin: 4.2 g/dL (ref 3.8–4.8)
Alkaline Phosphatase: 212 IU/L — ABNORMAL HIGH (ref 44–121)
BUN/Creatinine Ratio: 12 (ref 12–28)
BUN: 11 mg/dL (ref 8–27)
Bilirubin Total: 0.3 mg/dL (ref 0.0–1.2)
CO2: 22 mmol/L (ref 20–29)
Calcium: 9.5 mg/dL (ref 8.7–10.3)
Chloride: 98 mmol/L (ref 96–106)
Creatinine, Ser: 0.92 mg/dL (ref 0.57–1.00)
Globulin, Total: 3.1 g/dL (ref 1.5–4.5)
Glucose: 98 mg/dL (ref 70–99)
Potassium: 4.4 mmol/L (ref 3.5–5.2)
Sodium: 138 mmol/L (ref 134–144)
Total Protein: 7.3 g/dL (ref 6.0–8.5)
eGFR: 70 mL/min/{1.73_m2} (ref >59)

## 2021-09-09 LAB — CBC WITH DIFFERENTIAL/PLATELET
Basophils Absolute: 0.1 10*3/uL (ref 0.0–0.2)
Basos: 1 %
EOS (ABSOLUTE): 0.6 10*3/uL — ABNORMAL HIGH (ref 0.0–0.4)
Eos: 8 %
Hematocrit: 47.6 % — ABNORMAL HIGH (ref 34.0–46.6)
Hemoglobin: 16 g/dL — ABNORMAL HIGH (ref 11.1–15.9)
Immature Grans (Abs): 0 10*3/uL (ref 0.0–0.1)
Immature Granulocytes: 1 %
Lymphocytes Absolute: 2.7 10*3/uL (ref 0.7–3.1)
Lymphs: 34 %
MCH: 28.5 pg (ref 26.6–33.0)
MCHC: 33.6 g/dL (ref 31.5–35.7)
MCV: 85 fL (ref 79–97)
Monocytes Absolute: 0.7 10*3/uL (ref 0.1–0.9)
Monocytes: 9 %
Neutrophils Absolute: 3.9 10*3/uL (ref 1.4–7.0)
Neutrophils: 47 %
Platelets: 282 10*3/uL (ref 150–450)
RBC: 5.61 x10E6/uL — ABNORMAL HIGH (ref 3.77–5.28)
RDW: 13.4 % (ref 11.7–15.4)
WBC: 8 10*3/uL (ref 3.4–10.8)

## 2021-09-09 LAB — LIPID PANEL
Chol/HDL Ratio: 3.8 ratio (ref 0.0–4.4)
Cholesterol, Total: 202 mg/dL — ABNORMAL HIGH (ref 100–199)
HDL: 53 mg/dL (ref >39)
LDL Chol Calc (NIH): 125 mg/dL — ABNORMAL HIGH (ref 0–99)
Triglycerides: 134 mg/dL (ref 0–149)
VLDL Cholesterol Cal: 24 mg/dL (ref 5–40)

## 2021-09-09 LAB — IRON: Iron: 87 ug/dL (ref 27–139)

## 2021-09-09 LAB — VITAMIN B12: Vitamin B-12: 798 pg/mL (ref 232–1245)

## 2021-09-09 LAB — FERRITIN: Ferritin: 89 ng/mL (ref 15–150)

## 2021-09-09 LAB — VITAMIN D 25 HYDROXY (VIT D DEFICIENCY, FRACTURES): Vit D, 25-Hydroxy: 29.7 ng/mL — ABNORMAL LOW (ref 30.0–100.0)

## 2021-09-09 MED ORDER — TIZANIDINE HCL 4 MG PO TABS: 4.0000 mg | ORAL_TABLET | Freq: Three times a day (TID) | ORAL | 1 refills | Status: AC | PRN

## 2021-09-10 ENCOUNTER — Other Ambulatory Visit: Payer: Self-pay | Admitting: Family Medicine

## 2021-09-10 DIAGNOSIS — F411 Generalized anxiety disorder: Secondary | ICD-10-CM

## 2021-09-11 DIAGNOSIS — K432 Incisional hernia without obstruction or gangrene: Secondary | ICD-10-CM | POA: Diagnosis not present

## 2021-09-15 DIAGNOSIS — G894 Chronic pain syndrome: Secondary | ICD-10-CM | POA: Diagnosis not present

## 2021-09-15 DIAGNOSIS — M4322 Fusion of spine, cervical region: Secondary | ICD-10-CM | POA: Diagnosis not present

## 2021-09-15 DIAGNOSIS — M542 Cervicalgia: Secondary | ICD-10-CM | POA: Diagnosis not present

## 2021-09-20 ENCOUNTER — Other Ambulatory Visit: Payer: Self-pay | Admitting: Family Medicine

## 2021-09-20 DIAGNOSIS — F5104 Psychophysiologic insomnia: Secondary | ICD-10-CM

## 2021-09-21 ENCOUNTER — Encounter: Payer: Self-pay | Admitting: Family Medicine

## 2021-09-22 NOTE — Telephone Encounter (Signed)
Pt aware of Dr. Marjean Donna response on denying the increase and that refills on the current dose was sent to pharmacy

## 2021-09-22 NOTE — Telephone Encounter (Signed)
Please inform that because she is on high dose Celexa, it is NOT advisable to go up on the Trazodone.  She has an increased risk of serotonin syndrome with these medications and increasing dose increases that risk.

## 2021-10-07 ENCOUNTER — Telehealth: Payer: Self-pay | Admitting: Family Medicine

## 2021-10-07 DIAGNOSIS — K5909 Other constipation: Secondary | ICD-10-CM

## 2021-10-07 DIAGNOSIS — Z9889 Other specified postprocedural states: Secondary | ICD-10-CM

## 2021-10-07 MED ORDER — LINACLOTIDE 145 MCG PO CAPS: 145.0000 ug | ORAL_CAPSULE | Freq: Every day | ORAL | 12 refills | Status: DC

## 2021-10-07 NOTE — Telephone Encounter (Signed)
  Prescription Request  10/07/2021  Is this a "Controlled Substance" medicine? no  Have you seen your PCP in the last 2 weeks? no  If YES, route message to pool  -  If NO, patient needs to be scheduled for appointment.  What is the name of the medication or equipment? linaclotide (LINZESS) 145 MCG CAPS capsule pt wants increase of 290. She says that she was given a samples of 290 and says that it worked better  Have you contacted your pharmacy to request a refill?No because she wants an increase [facility]   Patient notified that their request is being sent to the clinical staff for review and that they should receive a response within 2 business days.

## 2021-10-09 ENCOUNTER — Other Ambulatory Visit: Payer: Self-pay

## 2021-10-09 ENCOUNTER — Encounter: Payer: Self-pay | Admitting: General Surgery

## 2021-10-09 ENCOUNTER — Ambulatory Visit: Payer: Medicare HMO | Admitting: General Surgery

## 2021-10-09 VITALS — BP 142/86 | HR 82 | Temp 98.7°F | Resp 14 | Ht 64.0 in | Wt 186.0 lb

## 2021-10-09 DIAGNOSIS — K432 Incisional hernia without obstruction or gangrene: Secondary | ICD-10-CM | POA: Diagnosis not present

## 2021-10-09 NOTE — Patient Instructions (Signed)
Open Hernia Repair, Adult °Open hernia repair is a surgical procedure to fix a hernia. A hernia occurs when an internal organ or tissue pushes through a weak spot in the muscles along the wall of the abdomen. Hernias commonly occur in the groin and around the belly button. °Most hernias tend to get worse over time. Often, surgery is done to prevent the hernia from becoming bigger, uncomfortable, or an emergency. Emergency surgery may be needed if contents of the abdomen get stuck in the opening (incarcerated hernia) or if the blood supply gets cut off (strangulated hernia). In an open repair, an incision is made in the abdomen to perform the surgery. °Tell a health care provider about: °Any allergies you have. °All medicines you are taking, including vitamins, herbs, eye drops, creams, and over-the-counter medicines. °Any problems you or family members have had with anesthetic medicines. °Any blood or bone disorders you have. °Any surgeries you have had. °Any medical conditions you have, including any recent cold or flu (influenza)symptoms. °Whether you are pregnant or may be pregnant. °What are the risks? °Generally, this is a safe procedure. However, problems may occur, including: °Long-lasting (chronic) pain. °Bleeding. °Infection. °Damage to the testicles. This can cause shrinking or swelling. °Damage to nearby structures or organs, including the bladder, blood vessels, intestines, or nerves near the hernia. °Blood clots. °Trouble passing urine. °Return of the hernia. °What happens before the procedure? °Medicines °Ask your health care provider about: °Changing or stopping your regular medicines. This is especially important if you are taking diabetes medicines or blood thinners. °Taking medicines such as aspirin and ibuprofen. These medicines can thin your blood. Do not take these medicines unless your health care provider tells you to take them. °Taking over-the-counter medicines, vitamins, herbs, and  supplements. °Surgery safety °Ask your health care provider: °How your surgery site will be marked. °What steps will be taken to help prevent infection. These steps may include: °Removing hair at the surgery site. °Washing skin with a germ-killing soap. °Receiving antibiotic medicine. °General instructions °You may have an exam or testing, such as blood tests or imaging studies. °Do not use any products that contain nicotine or tobacco for at least 4 weeks before the procedure. These products include cigarettes, chewing tobacco, and vaping devices, such as e-cigarettes. If you need help quitting, ask your health care provider. °Let your health care provider know if you develop a cold or any infection before your surgery. If you get an infection before surgery, you may receive antibiotics to treat it. °Plan to have a responsible adult take you home from the hospital or clinic. °If you will be going home right after the procedure, plan to have a responsible adult care for you for the time you are told. This is important. °What happens during the procedure? ° °An IV will be inserted into one of your veins. °You will be given one or more of the following: °A medicine to help you relax (sedative). °A medicine to numb the area (local anesthetic). °A medicine to make you fall asleep (general anesthetic). °Your surgeon will make an incision over the hernia. °The tissues of the hernia will be moved back into place. °The edges of the hernia may be stitched (sutured) together. °The opening in the abdominal muscles will be closed with stitches (sutures). Or, your surgeon will place a mesh patch made of artificial (synthetic) material over the opening. °The incision will be closed with sutures, skin glue, or adhesive strips. °A bandage (dressing) may be   placed over the incision. °The procedure may vary among health care providers and hospitals. °What happens after the procedure? °Your blood pressure, heart rate, breathing rate,  and blood oxygen level will be monitored until you leave the hospital or clinic. °You may be given medicine for pain. °If you were given a sedative during the procedure, it can affect you for several hours. Do not drive or operate machinery until your health care provider says that it is safe. °Summary °Open hernia repair is a surgical procedure to fix a hernia. Hernias commonly occur in the groin and around the belly button. °Emergency surgery may be needed if contents of the abdomen get stuck in the opening (incarcerated hernia) or if the blood supply gets cut off (strangulated hernia). °In this procedure, an incision is made in the abdomen to perform the surgery. °After the procedure, you may be given medicine for pain. °This information is not intended to replace advice given to you by your health care provider. Make sure you discuss any questions you have with your health care provider. °Document Revised: 06/10/2020 Document Reviewed: 06/10/2020 °Elsevier Patient Education © 2022 [employer] ° °

## 2021-10-09 NOTE — Progress Notes (Signed)
[facility] History and Physical   Chief Complaint   Follow-up     [name] is a 60 y.o. female.  HPI: [name] is well known to me s/p end colostomy for stercoral ulcer and then eventual reversal this past year. She developed a hernia in her colostomy site, and has been having issues with pain. We had obtained a CT scan to ensure that she had no other hernias or issues with her anastomosis as she was also having constipation. She had a CT with rectal contrast that demonstrated a patent anastomosis and a hernia at the colostomy site with colon in it and some constipation. She has been more regular lately on Linzess. She is doing better now after having some depression this summer with everything that has been going on this past year. She had seen [facility] regarding her hernia because she felt that she would get it fixed sooner but they recommended she stop smoking. She has since been trying to stop, and says she is down to less than a pack a day and cutting back daily. She wants to stop and has stopped multiple times this year.   The hernia site is painful at times and bulges out. She denies any obstructive symptoms or incarceration.  Past Medical History:  Diagnosis Date   Abdominal pain    Allergy    seasonal   Anxiety    Arthritis    Blood in stool    Breast changes, fibrocystic    Bruises easily    Cardiomegaly 2018   Clotting disorder (HCC)     itp , none since 1976, no current hematologist   Colon perforation due to stercoral ulcer with E. coli fecal peritonitis    Constipation    DDD (degenerative disc disease), cervical    with lumbar issues   Depression    E. coli sepsis -due to fecal peritonitis 12/23/2020   Generalized headaches    GERD (gastroesophageal reflux disease)    Glaucoma    Hemorrhoid    Hyperlipidemia    Hypertension    Idiopathic thrombocytopenia ([place])    as child   Leg swelling    both ankles   Liver  lesion    Nasal congestion    Nausea & vomiting    Neuromuscular disorder (HCC)    back  injury   Neuromuscular disorder ([place])    3 neck surgeries,neck fusion.pin,plates,screws   Osteoporosis    Panic attacks    pt also  has germaphobia   Rectal bleeding    Seizures ([place]) 1998   stress induced, one time, none since, no seizure meds   Seizures ([place])    Stated she had 2 weeks ago,it was like a transient,stare.04/01/21 pt. denies   Spleen enlarged 1976, none since   r/t idiopathic thrombocytopenia   Stercoral ulcer of large intestine 12/21/2020   Trouble swallowing    Unintentional weight loss    Weakness    weakness varies    Past Surgical History:  Procedure Laterality Date   ABDOMINAL HYSTERECTOMY  1997   BACK SURGERY     CERVICAL SPINE SURGERY  2005, 2007, 2010   x3, fusion done,plates and screws present   [place]  03/14/2012   Procedure: LAPAROSCOPIC CHOLECYSTECTOMY;  Surgeon: Stark Klein, MD;  Location: [facility];  Service: General;  Laterality: N/A;   COLECTOMY WITH COLOSTOMY CREATION/HARTMANN PROCEDURE N/A 12/21/2020   Procedure: COLECTOMY WITH COLOSTOMY CREATION/HARTMANN PROCEDURE;  Surgeon: Virl Cagey, MD;  Location: [facility];  Service: General;  Laterality: N/A;   colonscopy and esophagogastrodudononescopy  02-04-12   COLOSTOMY REVERSAL N/A 04/28/2021   Procedure: COLOSTOMY REVERSAL;  Surgeon: Virl Cagey, MD;  Location: [facility];  Service: General;  Laterality: N/A;   IR RADIOLOGIST EVAL & MGMT  01/15/2021   LUMBAR [place]GERY  11/2014    Family History  Problem Relation Age of Onset   Hypertension Mother    Heart disease Mother    Lung cancer Father    Cancer Father        lung   Diabetes Sister    Bipolar disorder Sister    Heart disease Sister        Congestive Heart Failure   Hypertension Sister    Heart disease Maternal Grandmother    Colon cancer Paternal Grandmother    Cancer Paternal Grandmother         colon   Esophageal cancer Neg Hx    Rectal cancer Neg Hx    Stomach cancer Neg Hx    Breast cancer Neg Hx     Social History   Tobacco Use   Smoking status: Every Day    Packs/day: 1.00    Years: 12.00    Pack years: 12.00    Types: Cigarettes   Smokeless tobacco: Never  Vaping Use   Vaping Use: Former   Quit date: 09/07/2016   Devices: only used for about a month  Substance Use Topics   Alcohol use: No    Alcohol/week: 0.0 standard drinks   Drug use: Yes    Types: Marijuana    Medications: I have reviewed the patient's current medications. Allergies as of 10/09/2021       Reactions   Nsaids Nausea Only   Abdominal pain        Medication List        Accurate as of October 09, 2021  1:31 PM. If you have any questions, ask your nurse or doctor.          amitriptyline 25 MG tablet Commonly known as: ELAVIL Take 25-50 mg by mouth See admin instructions. Take 50 mg by mouth at bedtime, may take 25 mg during the day if needed for pain   atenolol 50 MG tablet Commonly known as: TENORMIN TAKE 1 TABLET BY MOUTH EVERY DAY   busPIRone 15 MG tablet Commonly known as: BUSPAR Take 1 tablet (15 mg total) by mouth 3 (three) times daily. TAKE 1 TABLET WITH THE 5MG  TO EQUAL 20MG .   busPIRone 5 MG tablet Commonly known as: BUSPAR TAKE 1 TABLET BY MOUTH 3 TIMES DAILY. TAKE 1 TABLET WITH THE 15MG  TO EQUAL 20MG .   cetirizine 10 MG tablet Commonly known as: ZYRTEC Take 10 mg by mouth daily.   citalopram 40 MG tablet Commonly known as: CELEXA TAKE 1 TABLET BY MOUTH DAILY WITH BREAKFAST.   famotidine 20 MG tablet Commonly known as: PEPCID Take 1 tablet (20 mg total) by mouth daily as needed for heartburn or indigestion.   hydrochlorothiazide 25 MG tablet Commonly known as: HYDRODIURIL Take 1 tablet (25 mg total) by mouth daily.   latanoprost 0.005 % ophthalmic solution Commonly known as: XALATAN Place 1 drop into both eyes at bedtime.   linaclotide 145 MCG  Caps capsule Commonly known as: Linzess Take 1 capsule (145 mcg total) by mouth daily before breakfast. What changed: how much to take   lisinopril 10 MG tablet Commonly known as:  ZESTRIL Take 0.5-1 tablets (5-10 mg total) by mouth daily. As directed   nitroGLYCERIN 0.4 MG SL tablet Commonly known as: NITROSTAT Place 1 tablet (0.4 mg total) under the tongue every 5 (five) minutes as needed for chest pain.   ondansetron 4 MG tablet Commonly known as: ZOFRAN Take 4 mg by mouth every 8 (eight) hours as needed for nausea.   Oxycodone HCl 10 MG Tabs Take 1 tablet (10 mg total) by mouth 2 (two) times daily.   polyethylene glycol powder 17 GM/SCOOP powder Commonly known as: MiraLax Take 17 g by mouth in the morning and at bedtime.   tiZANidine 4 MG tablet Commonly known as: ZANAFLEX Take 1 tablet (4 mg total) by mouth every 8 (eight) hours as needed for muscle spasms.   traZODone 100 MG tablet Commonly known as: DESYREL TAKE 1 AND 1/2 TABLET (150 MG TOTAL) BY MOUTH AT BEDTIME AS NEEDED FOR SLEEP.         ROS:  A comprehensive review of systems was negative except for: Gastrointestinal: positive for abdominal pain and hernia at colostomy site  Blood pressure (!) 142/86, pulse 82, temperature 98.7 F (37.1 C), temperature source Other (Comment), resp. rate 14, height 5\' 4"  (1.626 m), weight 186 lb (84.4 kg), SpO2 95 %. Physical Exam Vitals reviewed.  Constitutional:      Appearance: Normal appearance.  HENT:     Head: Normocephalic.     Nose: Nose normal.     Mouth/Throat:     Mouth: Mucous membranes are moist.  Eyes:     Extraocular Movements: Extraocular movements intact.  Cardiovascular:     Rate and Rhythm: Normal rate.  Abdominal:     General: There is no distension.     Palpations: Abdomen is soft.     Tenderness: There is abdominal tenderness.     Hernia: A hernia is present.     Comments: Reducible hernia at colostomy site scar  Musculoskeletal:         General: Normal range of motion.     Cervical back: Normal range of motion.  Skin:    General: Skin is warm.  Neurological:     General: No focal deficit present.     Mental Status: She is alert and oriented to person, place, and time.  Psychiatric:        Mood and Affect: Mood normal.        Behavior: Behavior normal.        Thought Content: Thought content normal.        Judgment: Judgment normal.    Results: CLINICAL DATA:  Left lower quadrant abdominal pain. History of pneumoperitoneum from stercoral ulcer in sigmoid colon with sigmoid resection and Hartmann's procedure. Colostomy reversal on 04/28/2021.   EXAM: CT ABDOMEN AND PELVIS WITH CONTRAST   TECHNIQUE: Multidetector CT imaging of the abdomen and pelvis was performed using the standard protocol following bolus administration of intravenous contrast.   CONTRAST:  129mL OMNIPAQUE IOHEXOL 300 MG/ML  SOLN   COMPARISON:  05/01/2021   FINDINGS: Lower chest: Lung bases are clear.   Hepatobiliary: Normal appearance of the liver. Cholecystectomy. Mild intrahepatic and extrahepatic biliary dilatation likely secondary to the cholecystectomy.   Pancreas: Unremarkable. No pancreatic ductal dilatation or surrounding inflammatory changes.   Spleen: Normal in size without focal abnormality.   Adrenals/Urinary Tract: Small hypodensity in the right kidney is too small to definitively characterize but could represent a cyst. Normal adrenal glands. Negative for hydronephrosis. Normal appearance of the  urinary bladder. Normal appearance of the ureters.   Stomach/Bowel: Normal appearance of the stomach. Rectal contrast was injected through a rectal tube. The rectum and surgical anastomosis are patent and intact. Large amount of stool in the colon. No evidence for a bowel perforation or leak. However, there is a left abdominal wall hernia at the old colostomy site containing portion of the left transverse colon. Normal  appendix. Normal appearance of the small bowel. No evidence for bowel obstruction.   Vascular/Lymphatic: Mild atherosclerotic disease in the abdominal aorta without aneurysm. No lymph node enlargement in the abdomen or pelvis.   Reproductive: Status post hysterectomy. No adnexal masses.   Other: Again noted is a presacral collection. This collection is lower density than it was on the exam from 05/01/2021 and has markedly decreased in size. This structure measures 2.7 x 5.3 x 2.0 cm and previously measured 5.1 x 9.8 x 4.3 cm. Findings are most compatible with a resolving pelvic hematoma. There is no gas within this collection. No new abdominopelvic fluid collections. Negative for free air.   Musculoskeletal: Bilateral pedicle screw and rod fixation at L4, L5 and S1 with interbody devices.   IMPRESSION: 1. Left abdominal wall hernia at old colostomy site. Hernia contains a portion of the left transverse colon. No evidence for acute inflammation or obstruction. 2. Large amount of stool throughout the colon. 3. Resolving hematoma in the pelvis. 4. No evidence for a bowel leak at the surgical anastomosis. 5. Mild biliary dilatation likely secondary to cholecystectomy.     Electronically Signed   By: Markus Daft M.D.   On: 08/08/2021 08:19   Assessment & Plan:  [name] is a 60 y.o. female with a hernia at the colostomy site. Doing better and is in a better emotional state she says. She is quitting her smoking habit, and is smoking fewer cigarettes a day. We discussed that smoking, obesity and diabetes make hernia recurrence more likely. She wants to get this done as soon as she is not smoking, and says she will be off cigarettes soon. She knows that her choice to smoke or not smoke can greatly affect her healing.   Plans for hernia repair open with mesh in December after giving her a few more weeks to stop and be off the cigarettes. Given her prior issues with idiopathic  thrombocytopenia after her colostomy reversal and will plan to keep her overnight to monitor and get lab work the next day. Will not give her any heparin or lovenox during this time as she will be ambulating and will just be staying overnight.   All questions were answered to the satisfaction of the patient.    Virl Cagey 10/09/2021, 1:31 PM

## 2021-10-10 ENCOUNTER — Other Ambulatory Visit: Payer: Self-pay | Admitting: *Deleted

## 2021-10-10 DIAGNOSIS — Z9889 Other specified postprocedural states: Secondary | ICD-10-CM

## 2021-10-10 DIAGNOSIS — K5909 Other constipation: Secondary | ICD-10-CM

## 2021-10-10 MED ORDER — LINACLOTIDE 290 MCG PO CAPS
290.0000 ug | ORAL_CAPSULE | Freq: Every day | ORAL | 1 refills | Status: DC
Start: 2021-10-10 — End: 2022-04-09

## 2021-10-13 NOTE — H&P (Signed)
[facility] History and Physical   Chief Complaint   Follow-up     [name] is a 60 y.o. female.  HPI: [name] is well known to me s/p end colostomy for stercoral ulcer and then eventual reversal this past year. [name] developed a hernia in her colostomy site, and has been having issues with pain. We had obtained a CT scan to ensure that [name] had no other hernias or issues with her anastomosis as [name] was also having constipation. [name] had a CT with rectal contrast that demonstrated a patent anastomosis and a hernia at the colostomy site with colon in it and some constipation. [name] has been more regular lately on Linzess. [name] is doing better now after having some depression this summer with everything that has been going on this past year. [name] had seen [facility] regarding her hernia because [name] felt that [name] would get it fixed sooner but they recommended [name] stop smoking. [name] has since been trying to stop, and says [name] is down to less than a pack a day and cutting back daily. [name] wants to stop and has stopped multiple times this year.   The hernia site is painful at times and bulges out. [name] denies any obstructive symptoms or incarceration.  Past Medical History:  Diagnosis Date   Abdominal pain    Allergy    seasonal   Anxiety    Arthritis    Blood in stool    Breast changes, fibrocystic    Bruises easily    Cardiomegaly 2018   Clotting disorder (HCC)     itp , none since 1976, no current hematologist   Colon perforation due to stercoral ulcer with E. coli fecal peritonitis    Constipation    DDD (degenerative disc disease), cervical    with lumbar issues   Depression    E. coli sepsis -due to fecal peritonitis 12/23/2020   Generalized headaches    GERD (gastroesophageal reflux disease)    Glaucoma    Hemorrhoid    Hyperlipidemia    Hypertension    Idiopathic thrombocytopenia ([place])    as child   Leg swelling    both ankles   Liver  lesion    Nasal congestion    Nausea & vomiting    Neuromuscular disorder (HCC)    back  injury   Neuromuscular disorder ([place])    3 neck surgeries,neck fusion.pin,plates,screws   Osteoporosis    Panic attacks    pt also  has germaphobia   Rectal bleeding    Seizures ([place]) 1998   stress induced, one time, none since, no seizure meds   Seizures ([place])    Stated [name] had 2 weeks ago,it was like a transient,stare.04/01/21 pt. denies   Spleen enlarged 1976, none since   r/t idiopathic thrombocytopenia   Stercoral ulcer of large intestine 12/21/2020   Trouble swallowing    Unintentional weight loss    Weakness    weakness varies    Past Surgical History:  Procedure Laterality Date   ABDOMINAL HYSTERECTOMY  1997   BACK SURGERY     CERVICAL SPINE SURGERY  2005, 2007, 2010   x3, fusion done,plates and screws present   [place]  03/14/2012   Procedure: LAPAROSCOPIC CHOLECYSTECTOMY;  Surgeon: Stark Klein, MD;  Location: [facility];  Service: General;  Laterality: N/A;   COLECTOMY WITH COLOSTOMY CREATION/HARTMANN PROCEDURE N/A 12/21/2020   Procedure: COLECTOMY WITH COLOSTOMY CREATION/HARTMANN PROCEDURE;  Surgeon: Virl Cagey, MD;  Location: [facility];  Service: General;  Laterality: N/A;   colonscopy and esophagogastrodudononescopy  02-04-12   COLOSTOMY REVERSAL N/A 04/28/2021   Procedure: COLOSTOMY REVERSAL;  Surgeon: Virl Cagey, MD;  Location: [facility];  Service: General;  Laterality: N/A;   IR RADIOLOGIST EVAL & MGMT  01/15/2021   LUMBAR [place] [facility]  11/2014    Family History  Problem Relation Age of Onset   Hypertension Mother    Heart disease Mother    Lung cancer Father    Cancer Father        lung   Diabetes Sister    Bipolar disorder Sister    Heart disease Sister        Congestive Heart Failure   Hypertension Sister    Heart disease Maternal Grandmother    Colon cancer Paternal Grandmother    Cancer Paternal Grandmother         colon   Esophageal cancer Neg Hx    Rectal cancer Neg Hx    Stomach cancer Neg Hx    Breast cancer Neg Hx     Social History   Tobacco Use   Smoking status: Every Day    Packs/day: 1.00    Years: 12.00    Pack years: 12.00    Types: Cigarettes   Smokeless tobacco: Never  Vaping Use   Vaping Use: Former   Quit date: 09/07/2016   Devices: only used for about a month  Substance Use Topics   Alcohol use: No    Alcohol/week: 0.0 standard drinks   Drug use: Yes    Types: Marijuana    Medications: I have reviewed the patient's current medications. Allergies as of 10/09/2021       Reactions   Nsaids Nausea Only   Abdominal pain        Medication List        Accurate as of October 09, 2021  1:31 PM. If you have any questions, ask your nurse or doctor.          amitriptyline 25 MG tablet Commonly known as: ELAVIL Take 25-50 mg by mouth See admin instructions. Take 50 mg by mouth at bedtime, may take 25 mg during the day if needed for pain   atenolol 50 MG tablet Commonly known as: TENORMIN TAKE 1 TABLET BY MOUTH EVERY DAY   busPIRone 15 MG tablet Commonly known as: BUSPAR Take 1 tablet (15 mg total) by mouth 3 (three) times daily. TAKE 1 TABLET WITH THE 5MG  TO EQUAL 20MG .   busPIRone 5 MG tablet Commonly known as: BUSPAR TAKE 1 TABLET BY MOUTH 3 TIMES DAILY. TAKE 1 TABLET WITH THE 15MG  TO EQUAL 20MG .   cetirizine 10 MG tablet Commonly known as: ZYRTEC Take 10 mg by mouth daily.   citalopram 40 MG tablet Commonly known as: CELEXA TAKE 1 TABLET BY MOUTH DAILY WITH BREAKFAST.   famotidine 20 MG tablet Commonly known as: PEPCID Take 1 tablet (20 mg total) by mouth daily as needed for heartburn or indigestion.   hydrochlorothiazide 25 MG tablet Commonly known as: HYDRODIURIL Take 1 tablet (25 mg total) by mouth daily.   latanoprost 0.005 % ophthalmic solution Commonly known as: XALATAN Place 1 drop into both eyes at bedtime.   linaclotide 145 MCG  Caps capsule Commonly known as: Linzess Take 1 capsule (145 mcg total) by mouth daily before breakfast. What changed: how much to take   lisinopril 10 MG tablet Commonly known as:  ZESTRIL Take 0.5-1 tablets (5-10 mg total) by mouth daily. As directed   nitroGLYCERIN 0.4 MG SL tablet Commonly known as: NITROSTAT Place 1 tablet (0.4 mg total) under the tongue every 5 (five) minutes as needed for chest pain.   ondansetron 4 MG tablet Commonly known as: ZOFRAN Take 4 mg by mouth every 8 (eight) hours as needed for nausea.   Oxycodone HCl 10 MG Tabs Take 1 tablet (10 mg total) by mouth 2 (two) times daily.   polyethylene glycol powder 17 GM/SCOOP powder Commonly known as: MiraLax Take 17 g by mouth in the morning and at bedtime.   tiZANidine 4 MG tablet Commonly known as: ZANAFLEX Take 1 tablet (4 mg total) by mouth every 8 (eight) hours as needed for muscle spasms.   traZODone 100 MG tablet Commonly known as: DESYREL TAKE 1 AND 1/2 TABLET (150 MG TOTAL) BY MOUTH AT BEDTIME AS NEEDED FOR SLEEP.         ROS:  A comprehensive review of systems was negative except for: Gastrointestinal: positive for abdominal pain and hernia at colostomy site  Blood pressure (!) 142/86, pulse 82, temperature 98.7 F (37.1 C), temperature source Other (Comment), resp. rate 14, height 5\' 4"  (1.626 m), weight 186 lb (84.4 kg), SpO2 95 %. Physical Exam Vitals reviewed.  Constitutional:      Appearance: Normal appearance.  HENT:     Head: Normocephalic.     Nose: Nose normal.     Mouth/Throat:     Mouth: Mucous membranes are moist.  Eyes:     Extraocular Movements: Extraocular movements intact.  Cardiovascular:     Rate and Rhythm: Normal rate.  Abdominal:     General: There is no distension.     Palpations: Abdomen is soft.     Tenderness: There is abdominal tenderness.     Hernia: A hernia is present.     Comments: Reducible hernia at colostomy site scar  Musculoskeletal:         General: Normal range of motion.     Cervical back: Normal range of motion.  Skin:    General: Skin is warm.  Neurological:     General: No focal deficit present.     Mental Status: [name] is alert and oriented to person, place, and time.  Psychiatric:        Mood and Affect: Mood normal.        Behavior: Behavior normal.        Thought Content: Thought content normal.        Judgment: Judgment normal.    Results: CLINICAL DATA:  Left lower quadrant abdominal pain. History of pneumoperitoneum from stercoral ulcer in sigmoid colon with sigmoid resection and Hartmann's procedure. Colostomy reversal on 04/28/2021.   EXAM: CT ABDOMEN AND PELVIS WITH CONTRAST   TECHNIQUE: Multidetector CT imaging of the abdomen and pelvis was performed using the standard protocol following bolus administration of intravenous contrast.   CONTRAST:  161mL OMNIPAQUE IOHEXOL 300 MG/ML  SOLN   COMPARISON:  05/01/2021   FINDINGS: Lower chest: Lung bases are clear.   Hepatobiliary: Normal appearance of the liver. Cholecystectomy. Mild intrahepatic and extrahepatic biliary dilatation likely secondary to the cholecystectomy.   Pancreas: Unremarkable. No pancreatic ductal dilatation or surrounding inflammatory changes.   Spleen: Normal in size without focal abnormality.   Adrenals/Urinary Tract: Small hypodensity in the right kidney is too small to definitively characterize but could represent a cyst. Normal adrenal glands. Negative for hydronephrosis. Normal appearance of the  urinary bladder. Normal appearance of the ureters.   Stomach/Bowel: Normal appearance of the stomach. Rectal contrast was injected through a rectal tube. The rectum and surgical anastomosis are patent and intact. Large amount of stool in the colon. No evidence for a bowel perforation or leak. However, there is a left abdominal wall hernia at the old colostomy site containing portion of the left transverse colon. Normal  appendix. Normal appearance of the small bowel. No evidence for bowel obstruction.   Vascular/Lymphatic: Mild atherosclerotic disease in the abdominal aorta without aneurysm. No lymph node enlargement in the abdomen or pelvis.   Reproductive: Status post hysterectomy. No adnexal masses.   Other: Again noted is a presacral collection. This collection is lower density than it was on the exam from 05/01/2021 and has markedly decreased in size. This structure measures 2.7 x 5.3 x 2.0 cm and previously measured 5.1 x 9.8 x 4.3 cm. Findings are most compatible with a resolving pelvic hematoma. There is no gas within this collection. No new abdominopelvic fluid collections. Negative for free air.   Musculoskeletal: Bilateral pedicle screw and rod fixation at L4, L5 and S1 with interbody devices.   IMPRESSION: 1. Left abdominal wall hernia at old colostomy site. Hernia contains a portion of the left transverse colon. No evidence for acute inflammation or obstruction. 2. Large amount of stool throughout the colon. 3. Resolving hematoma in the pelvis. 4. No evidence for a bowel leak at the surgical anastomosis. 5. Mild biliary dilatation likely secondary to cholecystectomy.     Electronically Signed   By: Markus Daft M.D.   On: 08/08/2021 08:19   Assessment & Plan:  TRAYCE CARAVELLO is a 60 y.o. female with a hernia at the colostomy site. Doing better and is in a better emotional state [name] says. [name] is quitting her smoking habit, and is smoking fewer cigarettes a day. We discussed that smoking, obesity and diabetes make hernia recurrence more likely. [name] wants to get this done as soon as [name] is not smoking, and says [name] will be off cigarettes soon. [name] knows that her choice to smoke or not smoke can greatly affect her healing.   Plans for hernia repair open with mesh in December after giving her a few more weeks to stop and be off the cigarettes. Given her prior issues with idiopathic  thrombocytopenia after her colostomy reversal and will plan to keep her overnight to monitor and get lab work the next day. Will not give her any heparin or lovenox during this time as [name] will be ambulating and will just be staying overnight.   All questions were answered to the satisfaction of the patient.    Virl Cagey 10/09/2021, 1:31 PM

## 2021-10-16 DIAGNOSIS — M542 Cervicalgia: Secondary | ICD-10-CM | POA: Diagnosis not present

## 2021-10-16 DIAGNOSIS — G894 Chronic pain syndrome: Secondary | ICD-10-CM | POA: Diagnosis not present

## 2021-10-16 DIAGNOSIS — M4322 Fusion of spine, cervical region: Secondary | ICD-10-CM | POA: Diagnosis not present

## 2021-10-24 ENCOUNTER — Ambulatory Visit: Payer: Medicare HMO | Admitting: Urology

## 2021-10-24 ENCOUNTER — Encounter: Payer: Self-pay | Admitting: Urology

## 2021-10-24 ENCOUNTER — Other Ambulatory Visit: Payer: Self-pay

## 2021-10-24 VITALS — BP 148/84 | HR 64

## 2021-10-24 DIAGNOSIS — N281 Cyst of kidney, acquired: Secondary | ICD-10-CM | POA: Diagnosis not present

## 2021-10-24 LAB — URINALYSIS, ROUTINE W REFLEX MICROSCOPIC
Bilirubin, UA: NEGATIVE
Glucose, UA: NEGATIVE
Ketones, UA: NEGATIVE
Nitrite, UA: NEGATIVE
Protein,UA: NEGATIVE
RBC, UA: NEGATIVE
Specific Gravity, UA: 1.02 (ref 1.005–1.030)
Urobilinogen, Ur: 0.2 mg/dL (ref 0.2–1.0)
pH, UA: 6.5 (ref 5.0–7.5)

## 2021-10-24 LAB — MICROSCOPIC EXAMINATION
RBC, Urine: NONE SEEN /hpf (ref 0–2)
Renal Epithel, UA: NONE SEEN /hpf

## 2021-10-24 NOTE — Patient Instructions (Signed)
FU in one year for recheck of renal cyst with renal US prior to appt.

## 2021-10-24 NOTE — Progress Notes (Signed)
Urological Symptom Review  Patient is experiencing the following symptoms: Negative   Review of Systems  Gastrointestinal (upper)  : Negative for upper GI symptoms  Gastrointestinal (lower) : Constipation  Constitutional : Negative for symptoms  Skin: Negative for skin symptoms  Eyes: Negative for eye symptoms  Ear/Nose/Throat : Negative for Ear/Nose/Throat symptoms  Hematologic/Lymphatic: Easy bruising  Cardiovascular : Negative for cardiovascular symptoms  Respiratory : Negative for respiratory symptoms  Endocrine: Negative for endocrine symptoms  Musculoskeletal: Back pain Joint pain  Neurological: Negative for neurological symptoms  Psychologic: Negative for psychiatric symptoms

## 2021-10-24 NOTE — Progress Notes (Signed)
10/24/2021 8:51 AM   [name] 11/14/1960 [id number]  Referring provider: [name], DO [place],  [name] [id number]  Chief Complaint  Patient presents with   Other    Renal Cyst on CT    HPI: Pt is a 60YO female referred to urology by general surgery and PCP for evaluation of incidental finding of right sided renal cyst noted on CTAP. Pt has upcoming incisional hernia repair scheduled on 12/21 with Dr. [name], MD. Pt has complicated GI hx post perforated colon and peritonitis. Pt denies flank pain, urinary frequency, urgency, dysuria or gross hematuria. UA-no RBCs   PMH: Past Medical History:  Diagnosis Date   Abdominal pain    Allergy    seasonal   Anxiety    Arthritis    Blood in stool    Breast changes, fibrocystic    Bruises easily    Cardiomegaly 2018   Clotting disorder (HCC)     itp , none since 1976, no current hematologist   Colon perforation due to stercoral ulcer with E. coli fecal peritonitis    Constipation    DDD (degenerative disc disease), cervical    with lumbar issues   Depression    E. coli sepsis -due to fecal peritonitis 12/23/2020   Generalized headaches    GERD (gastroesophageal reflux disease)    Glaucoma    Hemorrhoid    Hyperlipidemia    Hypertension    Idiopathic thrombocytopenia ([place])    as child   Leg swelling    both ankles   Liver lesion    Nasal congestion    Nausea & vomiting    Neuromuscular disorder (HCC)    back  injury   Neuromuscular disorder ([place])    3 neck surgeries,neck fusion.pin,plates,screws   Osteoporosis    Panic attacks    pt also  has germaphobia   Rectal bleeding    Seizures ([place]) 1998   stress induced, one time, none since, no seizure meds   Seizures ([place])    Stated she had 2 weeks ago,it was like a transient,stare.04/01/21 pt. denies   Spleen enlarged 1976, none since   r/t idiopathic thrombocytopenia   Stercoral ulcer of large intestine 12/21/2020   Trouble  swallowing    Unintentional weight loss    Weakness    weakness varies    Surgical History: Past Surgical History:  Procedure Laterality Date   ABDOMINAL HYSTERECTOMY  1997   BACK SURGERY     CERVICAL SPINE SURGERY  2005, 2007, 2010   x3, fusion done,plates and screws present   [place]  03/14/2012   Procedure: LAPAROSCOPIC CHOLECYSTECTOMY;  Surgeon: Stark Klein, MD;  Location: [facility];  Service: General;  Laterality: N/A;   COLECTOMY WITH COLOSTOMY CREATION/HARTMANN PROCEDURE N/A 12/21/2020   Procedure: COLECTOMY WITH COLOSTOMY CREATION/HARTMANN PROCEDURE;  Surgeon: Virl Cagey, MD;  Location: [facility];  Service: General;  Laterality: N/A;   colonscopy and esophagogastrodudononescopy  02-04-12   COLOSTOMY REVERSAL N/A 04/28/2021   Procedure: COLOSTOMY REVERSAL;  Surgeon: Virl Cagey, MD;  Location: [facility];  Service: General;  Laterality: N/A;   IR RADIOLOGIST EVAL & MGMT  01/15/2021   LUMBAR [place] [facility]  11/2014    Home Medications:  Allergies as of 10/24/2021       Reactions   Nsaids Nausea Only   Abdominal pain        Medication List  Accurate as of October 24, 2021  8:51 AM. If you have any questions, ask your nurse or doctor.          amitriptyline 25 MG tablet Commonly known as: ELAVIL Take 25-50 mg by mouth See admin instructions. Take 50 mg by mouth at bedtime, may take 25-50 mg during the day if needed for pain   atenolol 50 MG tablet Commonly known as: TENORMIN TAKE 1 TABLET BY MOUTH EVERY DAY   busPIRone 15 MG tablet Commonly known as: BUSPAR Take 1 tablet (15 mg total) by mouth 3 (three) times daily. TAKE 1 TABLET WITH THE 5MG  TO EQUAL 20MG .   busPIRone 5 MG tablet Commonly known as: BUSPAR TAKE 1 TABLET BY MOUTH 3 TIMES DAILY. TAKE 1 TABLET WITH THE 15MG  TO EQUAL 20MG .   cetirizine 10 MG tablet Commonly known as: ZYRTEC Take 10 mg by mouth daily.   citalopram 40 MG tablet Commonly known  as: CELEXA TAKE 1 TABLET BY MOUTH DAILY WITH BREAKFAST.   famotidine 20 MG tablet Commonly known as: PEPCID Take 1 tablet (20 mg total) by mouth daily as needed for heartburn or indigestion.   hydrochlorothiazide 25 MG tablet Commonly known as: HYDRODIURIL Take 1 tablet (25 mg total) by mouth daily.   latanoprost 0.005 % ophthalmic solution Commonly known as: XALATAN Place 1 drop into both eyes at bedtime.   linaclotide 290 MCG Caps capsule Commonly known as: Linzess Take 1 capsule (290 mcg total) by mouth daily before breakfast.   lisinopril 10 MG tablet Commonly known as: ZESTRIL Take 0.5-1 tablets (5-10 mg total) by mouth daily. As directed   nitroGLYCERIN 0.4 MG SL tablet Commonly known as: NITROSTAT Place 1 tablet (0.4 mg total) under the tongue every 5 (five) minutes as needed for chest pain.   ondansetron 4 MG tablet Commonly known as: ZOFRAN Take 4 mg by mouth every 8 (eight) hours as needed for nausea.   Oxycodone HCl 10 MG Tabs Take 1 tablet (10 mg total) by mouth 2 (two) times daily. What changed: when to take this   polyethylene glycol powder 17 GM/SCOOP powder Commonly known as: MiraLax Take 17 g by mouth in the morning and at bedtime.   predniSONE 5 MG (21) Tbpk tablet Commonly known as: STERAPRED UNI-PAK 21 TAB TAKE 6 TABLETS ON DAY 1 AS DIRECTED ON PACKAGE AND DECREASE BY 1 TAB EACH DAY FOR A TOTAL OF 6 DAYS   tiZANidine 4 MG tablet Commonly known as: ZANAFLEX Take 1 tablet (4 mg total) by mouth every 8 (eight) hours as needed for muscle spasms.   traZODone 100 MG tablet Commonly known as: DESYREL TAKE 1 AND 1/2 TABLET (150 MG TOTAL) BY MOUTH AT BEDTIME AS NEEDED FOR SLEEP.        Allergies:  Allergies  Allergen Reactions   Nsaids Nausea Only    Abdominal pain    Family History: Family History  Problem Relation Age of Onset   Hypertension Mother    Heart disease Mother    Lung cancer Father    Cancer Father        lung   Diabetes  Sister    Bipolar disorder Sister    Heart disease Sister        Congestive Heart Failure   Hypertension Sister    Heart disease Maternal Grandmother    Colon cancer Paternal Grandmother    Cancer Paternal Grandmother        colon   Esophageal cancer Neg Hx  Rectal cancer Neg Hx    Stomach cancer Neg Hx    Breast cancer Neg Hx     Social History:  reports that she has been smoking cigarettes. She has a 12.00 pack-year smoking history. She has never used smokeless tobacco. She reports current drug use. Drug: Marijuana. She reports that she does not drink alcohol.  ROS: All other review of systems were reviewed and are negative except what is noted above in HPI  Physical Exam: There were no vitals taken for this visit.  Constitutional:  Alert and oriented, No acute distress. HEENT: [place] AT Cardiovascular: No clubbing, cyanosis, or edema. Respiratory: Normal respiratory effort, no increased work of breathing. GI: Abdomen is soft without gaurding GU: No CVA tenderness.  Skin: No rashes Neurologic: Grossly intact, no focal deficits, moving all 4 extremities. Psychiatric: Normal mood and affect.  Laboratory Data: Lab Results  Component Value Date   WBC 8.0 09/08/2021   HGB 16.0 (H) 09/08/2021   HCT 47.6 (H) 09/08/2021   MCV 85 09/08/2021   PLT 282 09/08/2021    Lab Results  Component Value Date   CREATININE 0.92 09/08/2021    No results found for: PSA  No results found for: TESTOSTERONE  Lab Results  Component Value Date   HGBA1C 5.8 (H) 09/08/2021    Urinalysis    Component Value Date/Time   COLORURINE YELLOW 12/31/2020 0500   APPEARANCEUR Clear 05/20/2021 1533   LABSPEC 1.034 (H) 12/31/2020 0500   PHURINE 8.0 12/31/2020 0500   GLUCOSEU Negative 05/20/2021 1533   GLUCOSEU NEGATIVE 09/21/2017 0932   HGBUR NEGATIVE 12/31/2020 0500   BILIRUBINUR Negative 05/20/2021 1533   KETONESUR NEGATIVE 12/31/2020 0500   PROTEINUR 2+ (A) 05/20/2021 1533   PROTEINUR  NEGATIVE 12/31/2020 0500   UROBILINOGEN 0.2 09/21/2017 0932   NITRITE Negative 05/20/2021 1533   NITRITE NEGATIVE 12/31/2020 0500   LEUKOCYTESUR 1+ (A) 05/20/2021 1533   LEUKOCYTESUR NEGATIVE 12/31/2020 0500    Lab Results  Component Value Date   LABMICR See below: 05/20/2021   WBCUA 0-5 05/20/2021   RBCUA None seen 02/04/2018   LABEPIT 0-10 05/20/2021   MUCUS Present 08/19/2017   BACTERIA Few 05/20/2021    Pertinent Imaging: CTAP images dated 08/07/21. Right renal cyst measures 61ml   Assessment & Plan:    1. Kidney cysts Pt reassured. Pt will fu in 6 weeks for recheck with renal US prior to appt. - Urinalysis, Routine w reflex microscopic  No follow-ups on file.    [facility] [place]

## 2021-10-24 NOTE — Patient Instructions (Signed)
[name]  10/24/2021     @PREFPERIOPPHARMACY @   Your procedure is scheduled on  10/29/2021.   Report to [facility] at  0800  A.M.   Call this number if you have problems the morning of surgery:  [phone]   Remember:  Do not eat or drink after midnight.      Take these medicines the morning of surgery with A SIP OF WATER       elavil or oxycodone (if needed), atenolol, buspar, zyrtec, celexa, pepcid, zofran (if needed), zanaflex(if needed).    Do not wear jewelry, make-up or nail polish.  Do not wear lotions, powders, or perfumes, or deodorant.  Do not shave 48 hours prior to surgery.  Men may shave face and neck.  Do not bring valuables to the hospital.  [facility] is not responsible for any belongings or valuables.  Contacts, dentures or bridgework may not be worn into surgery.  Leave your suitcase in the car.  After surgery it may be brought to your room.  For patients admitted to the hospital, discharge time will be determined by your treatment team.  Patients discharged the day of surgery will not be allowed to drive home and must have someone with them for 24 hours.    Special instructions:   DO NOT smoke tobacco or vape for 24 hours before your procedure.  Please read over the following fact sheets that you were given. Coughing and Deep Breathing, Surgical Site Infection Prevention, Anesthesia Post-op Instructions, and Care and Recovery After Surgery      Open Hernia Repair, Adult, Care After What can I expect after the procedure? After the procedure, it is common to have: Mild discomfort. Slight bruising. Mild swelling. Pain in the belly (abdomen). A small amount of blood from the cut from surgery (incision). Follow these instructions at home: Your doctor may give you more specific instructions. If you have problems, call your doctor. Medicines Take over-the-counter and prescription medicines only as told by your doctor. If  told, take steps to prevent problems with pooping (constipation). You may need to: Drink enough fluid to keep your pee (urine) pale yellow. Take medicines. You will be told what medicines to take. Eat foods that are high in fiber. These include beans, whole grains, and fresh fruits and vegetables. Limit foods that are high in fat and sugar. These include fried or sweet foods. Ask your doctor if you should avoid driving or using machines while you are taking your medicine. Incision care  Follow instructions from your doctor about how to take care of your incision. Make sure you: Wash your hands with soap and water for at least 20 seconds before and after you change your bandage (dressing). If you cannot use soap and water, use hand sanitizer. Change your bandage. Leave stitches or skin glue in place for at least 2 weeks. Leave tape strips alone unless you are told to take them off. You may trim the edges of the tape strips if they curl up. Check your incision every day for signs of infection. Check for: More redness, swelling, or pain. More fluid or blood. Warmth. Pus or a bad smell. Wear loose, soft clothing while your incision heals. Activity  Rest as told by your doctor. Do not lift anything that is heavier than 10 lb (4.5 kg), or the limit that you are told. Do not play contact sports until your doctor says that this is safe.  If you were given a sedative during your procedure, do not drive or use machines until your doctor says that it is safe. A sedative is a medicine that helps you relax. Return to your normal activities when your doctor says that it is safe. General instructions Do not take baths, swim, or use a hot tub. Ask your doctor about taking showers or sponge baths. Hold a pillow over your belly when you cough or sneeze. This helps with pain. Do not smoke or use any products that contain nicotine or tobacco. If you need help quitting, ask your doctor. Keep all follow-up  visits. Contact a doctor if: You have any of these signs of infection in or around your incision: More redness, swelling, or pain. More fluid or blood. Warmth. Pus. A bad smell. You have a fever or chills. You have blood in your poop (stool). You have not pooped (had a bowel movement) in 2-3 days. Medicine does not help your pain. Get help right away if: You have chest pain, or you are short of breath. You feel faint or light-headed. You have very bad pain. You vomit and your pain is worse. You have pain, swelling, or redness in a leg. These symptoms may be an emergency. Get help right away. Call your local emergency services (911 in the [place] Do not wait to see if the symptoms will go away. Do not drive yourself to the hospital. Summary After this procedure, it is common to have mild discomfort, slight bruising, and mild swelling. Follow instructions from your doctor about how to take care of your cut from surgery (incision). Check every day for signs of infection. Do not lift heavy objects or play contact sports until your doctor says it is safe. Return to your normal activities as told by your doctor. This information is not intended to replace advice given to you by your health care provider. Make sure you discuss any questions you have with your health care provider. Document Revised: 06/10/2020 Document Reviewed: 06/10/2020 Elsevier Patient Education  2022 [place] Anesthesia, Adult, Care After This sheet gives you information about how to care for yourself after your procedure. Your health care provider may also give you more specific instructions. If you have problems or questions, contact your health care provider. What can I expect after the procedure? After the procedure, the following side effects are common: Pain or discomfort at the IV site. Nausea. Vomiting. Sore throat. Trouble concentrating. Feeling cold or chills. Feeling weak or  tired. Sleepiness and fatigue. Soreness and body aches. These side effects can affect parts of the body that were not involved in surgery. Follow these instructions at home: For the time period you were told by your health care provider:  Rest. Do not participate in activities where you could fall or become injured. Do not drive or use machinery. Do not drink alcohol. Do not take sleeping pills or medicines that cause drowsiness. Do not make important decisions or sign legal documents. Do not take care of children on your own. Eating and drinking Follow any instructions from your health care provider about eating or drinking restrictions. When you feel hungry, start by eating small amounts of foods that are soft and easy to digest (bland), such as toast. Gradually return to your regular diet. Drink enough fluid to keep your urine pale yellow. If you vomit, rehydrate by drinking water, juice, or clear broth. General instructions If you have sleep apnea, surgery and certain medicines can increase your  risk for breathing problems. Follow instructions from your health care provider about wearing your sleep device: Anytime you are sleeping, including during daytime naps. While taking prescription pain medicines, sleeping medicines, or medicines that make you drowsy. Have a responsible adult stay with you for the time you are told. It is important to have someone help care for you until you are awake and alert. Return to your normal activities as told by your health care provider. Ask your health care provider what activities are safe for you. Take over-the-counter and prescription medicines only as told by your health care provider. If you smoke, do not smoke without supervision. Keep all follow-up visits as told by your health care provider. This is important. Contact a health care provider if: You have nausea or vomiting that does not get better with medicine. You cannot eat or drink  without vomiting. You have pain that does not get better with medicine. You are unable to pass urine. You develop a skin rash. You have a fever. You have redness around your IV site that gets worse. Get help right away if: You have difficulty breathing. You have chest pain. You have blood in your urine or stool, or you vomit blood. Summary After the procedure, it is common to have a sore throat or nausea. It is also common to feel tired. Have a responsible adult stay with you for the time you are told. It is important to have someone help care for you until you are awake and alert. When you feel hungry, start by eating small amounts of foods that are soft and easy to digest (bland), such as toast. Gradually return to your regular diet. Drink enough fluid to keep your urine pale yellow. Return to your normal activities as told by your health care provider. Ask your health care provider what activities are safe for you. This information is not intended to replace advice given to you by your health care provider. Make sure you discuss any questions you have with your health care provider. Document Revised: 07/11/2020 Document Reviewed: 02/08/2020 Elsevier Patient Education  2022 [place]. How to Use Chlorhexidine for Bathing Chlorhexidine gluconate (CHG) is a germ-killing (antiseptic) solution that is used to clean the skin. It can get rid of the bacteria that normally live on the skin and can keep them away for about 24 hours. To clean your skin with CHG, you may be given: A CHG solution to use in the shower or as part of a sponge bath. A prepackaged cloth that contains CHG. Cleaning your skin with CHG may help lower the risk for infection: While you are staying in the intensive care unit of the hospital. If you have a vascular access, such as a central line, to provide short-term or long-term access to your veins. If you have a catheter to drain urine from your bladder. If you are on a  ventilator. A ventilator is a machine that helps you breathe by moving air in and out of your lungs. After surgery. What are the risks? Risks of using CHG include: A skin reaction. Hearing loss, if CHG gets in your ears and you have a perforated eardrum. Eye injury, if CHG gets in your eyes and is not rinsed out. The CHG product catching fire. Make sure that you avoid smoking and flames after applying CHG to your skin. Do not use CHG: If you have a chlorhexidine allergy or have previously reacted to chlorhexidine. On babies younger than 8 months of age.  How to use CHG solution Use CHG only as told by your health care provider, and follow the instructions on the label. Use the full amount of CHG as directed. Usually, this is one bottle. During a shower Follow these steps when using CHG solution during a shower (unless your health care provider gives you different instructions): Start the shower. Use your normal soap and shampoo to wash your face and hair. Turn off the shower or move out of the shower stream. Pour the CHG onto a clean washcloth. Do not use any type of brush or rough-edged sponge. Starting at your neck, lather your body down to your toes. Make sure you follow these instructions: If you will be having surgery, pay special attention to the part of your body where you will be having surgery. Scrub this area for at least 1 minute. Do not use CHG on your head or face. If the solution gets into your ears or eyes, rinse them well with water. Avoid your genital area. Avoid any areas of skin that have broken skin, cuts, or scrapes. Scrub your back and under your arms. Make sure to wash skin folds. Let the lather sit on your skin for 1-2 minutes or as long as told by your health care provider. Thoroughly rinse your entire body in the shower. Make sure that all body creases and crevices are rinsed well. Dry off with a clean towel. Do not put any substances on your body afterward--such  as powder, lotion, or perfume--unless you are told to do so by your health care provider. Only use lotions that are recommended by the manufacturer. Put on clean clothes or pajamas. If it is the night before your surgery, sleep in clean sheets.  During a sponge bath Follow these steps when using CHG solution during a sponge bath (unless your health care provider gives you different instructions): Use your normal soap and shampoo to wash your face and hair. Pour the CHG onto a clean washcloth. Starting at your neck, lather your body down to your toes. Make sure you follow these instructions: If you will be having surgery, pay special attention to the part of your body where you will be having surgery. Scrub this area for at least 1 minute. Do not use CHG on your head or face. If the solution gets into your ears or eyes, rinse them well with water. Avoid your genital area. Avoid any areas of skin that have broken skin, cuts, or scrapes. Scrub your back and under your arms. Make sure to wash skin folds. Let the lather sit on your skin for 1-2 minutes or as long as told by your health care provider. Using a different clean, wet washcloth, thoroughly rinse your entire body. Make sure that all body creases and crevices are rinsed well. Dry off with a clean towel. Do not put any substances on your body afterward--such as powder, lotion, or perfume--unless you are told to do so by your health care provider. Only use lotions that are recommended by the manufacturer. Put on clean clothes or pajamas. If it is the night before your surgery, sleep in clean sheets. How to use CHG prepackaged cloths Only use CHG cloths as told by your health care provider, and follow the instructions on the label. Use the CHG cloth on clean, dry skin. Do not use the CHG cloth on your head or face unless your health care provider tells you to. When washing with the CHG cloth: Avoid your genital area. Avoid  any areas of skin  that have broken skin, cuts, or scrapes. Before surgery Follow these steps when using a CHG cloth to clean before surgery (unless your health care provider gives you different instructions): Using the CHG cloth, vigorously scrub the part of your body where you will be having surgery. Scrub using a back-and-forth motion for 3 minutes. The area on your body should be completely wet with CHG when you are done scrubbing. Do not rinse. Discard the cloth and let the area air-dry. Do not put any substances on the area afterward, such as powder, lotion, or perfume. Put on clean clothes or pajamas. If it is the night before your surgery, sleep in clean sheets.  For general bathing Follow these steps when using CHG cloths for general bathing (unless your health care provider gives you different instructions). Use a separate CHG cloth for each area of your body. Make sure you wash between any folds of skin and between your fingers and toes. Wash your body in the following order, switching to a new cloth after each step: The front of your neck, shoulders, and chest. Both of your arms, under your arms, and your hands. Your stomach and groin area, avoiding the genitals. Your right leg and foot. Your left leg and foot. The back of your neck, your back, and your buttocks. Do not rinse. Discard the cloth and let the area air-dry. Do not put any substances on your body afterward--such as powder, lotion, or perfume--unless you are told to do so by your health care provider. Only use lotions that are recommended by the manufacturer. Put on clean clothes or pajamas. Contact a health care provider if: Your skin gets irritated after scrubbing. You have questions about using your solution or cloth. You swallow any chlorhexidine. Call your local poison control center (1-702-326-3632 in the [place] Get help right away if: Your eyes itch badly, or they become very red or swollen. Your skin itches badly and is red or  swollen. Your hearing changes. You have trouble seeing. You have swelling or tingling in your mouth or throat. You have trouble breathing. These symptoms may represent a serious problem that is an emergency. Do not wait to see if the symptoms will go away. Get medical help right away. Call your local emergency services (911 in the [place] Do not drive yourself to the hospital. Summary Chlorhexidine gluconate (CHG) is a germ-killing (antiseptic) solution that is used to clean the skin. Cleaning your skin with CHG may help to lower your risk for infection. You may be given CHG to use for bathing. It may be in a bottle or in a prepackaged cloth to use on your skin. Carefully follow your health care provider's instructions and the instructions on the product label. Do not use CHG if you have a chlorhexidine allergy. Contact your health care provider if your skin gets irritated after scrubbing. This information is not intended to replace advice given to you by your health care provider. Make sure you discuss any questions you have with your health care provider. Document Revised: 01/06/2021 Document Reviewed: 01/06/2021 Elsevier Patient Education  2022 [employer].

## 2021-10-27 ENCOUNTER — Encounter (HOSPITAL_COMMUNITY)
Admission: RE | Admit: 2021-10-27 | Discharge: 2021-10-27 | Disposition: A | Discharge status: Home / Self Care | Payer: Medicare HMO | Source: Ambulatory Visit | Attending: General Surgery | Admitting: General Surgery

## 2021-10-27 ENCOUNTER — Other Ambulatory Visit (HOSPITAL_COMMUNITY)
Admission: RE | Admit: 2021-10-27 | Discharge: 2021-10-27 | Disposition: A | Discharge status: Home / Self Care | Payer: Medicare HMO | Source: Ambulatory Visit | Attending: General Surgery | Admitting: General Surgery

## 2021-10-27 ENCOUNTER — Other Ambulatory Visit: Payer: Self-pay

## 2021-10-27 ENCOUNTER — Encounter (HOSPITAL_COMMUNITY): Payer: Self-pay

## 2021-10-27 VITALS — BP 145/85 | HR 82 | Temp 98.6°F | Resp 16 | Ht 64.0 in | Wt 186.0 lb

## 2021-10-27 DIAGNOSIS — Z20822 Contact with and (suspected) exposure to covid-19: Secondary | ICD-10-CM | POA: Insufficient documentation

## 2021-10-27 DIAGNOSIS — E876 Hypokalemia: Secondary | ICD-10-CM | POA: Insufficient documentation

## 2021-10-27 DIAGNOSIS — D696 Thrombocytopenia, unspecified: Secondary | ICD-10-CM | POA: Insufficient documentation

## 2021-10-27 DIAGNOSIS — Z01812 Encounter for preprocedural laboratory examination: Secondary | ICD-10-CM | POA: Diagnosis not present

## 2021-10-27 DIAGNOSIS — Z01818 Encounter for other preprocedural examination: Secondary | ICD-10-CM

## 2021-10-27 LAB — CBC WITH DIFFERENTIAL/PLATELET
Abs Immature Granulocytes: 0.05 10*3/uL (ref 0.00–0.07)
Basophils Absolute: 0.1 10*3/uL (ref 0.0–0.1)
Basophils Relative: 1 %
Eosinophils Absolute: 0.5 10*3/uL (ref 0.0–0.5)
Eosinophils Relative: 5 %
HCT: 46.3 % — ABNORMAL HIGH (ref 36.0–46.0)
Hemoglobin: 15.3 g/dL — ABNORMAL HIGH (ref 12.0–15.0)
Immature Granulocytes: 1 %
Lymphocytes Relative: 30 %
Lymphs Abs: 2.7 10*3/uL (ref 0.7–4.0)
MCH: 29.5 pg (ref 26.0–34.0)
MCHC: 33 g/dL (ref 30.0–36.0)
MCV: 89.4 fL (ref 80.0–100.0)
Monocytes Absolute: 0.7 10*3/uL (ref 0.1–1.0)
Monocytes Relative: 8 %
Neutro Abs: 5.1 10*3/uL (ref 1.7–7.7)
Neutrophils Relative %: 55 %
Platelets: 288 10*3/uL (ref 150–400)
RBC: 5.18 MIL/uL — ABNORMAL HIGH (ref 3.87–5.11)
RDW: 13.5 % (ref 11.5–15.5)
WBC: 9.1 10*3/uL (ref 4.0–10.5)
nRBC: 0 % (ref 0.0–0.2)

## 2021-10-27 LAB — BASIC METABOLIC PANEL
Anion gap: 10 (ref 5–15)
BUN: 9 mg/dL (ref 8–23)
CO2: 29 mmol/L (ref 22–32)
Calcium: 9 mg/dL (ref 8.9–10.3)
Chloride: 99 mmol/L (ref 98–111)
Creatinine, Ser: 0.77 mg/dL (ref 0.44–1.00)
GFR, Estimated: >60 mL/min (ref >60)
Glucose, Bld: 103 mg/dL — ABNORMAL HIGH (ref 70–99)
Potassium: 3.7 mmol/L (ref 3.5–5.1)
Sodium: 138 mmol/L (ref 135–145)

## 2021-10-27 LAB — SARS CORONAVIRUS 2 (TAT 6-24 HRS): SARS Coronavirus 2: NEGATIVE

## 2021-10-28 ENCOUNTER — Ambulatory Visit: Payer: Medicare HMO | Admitting: Registered Nurse

## 2021-10-29 ENCOUNTER — Encounter (HOSPITAL_COMMUNITY)
Admission: RE | Disposition: A | Discharge status: Home / Self Care | Payer: Self-pay | Source: Ambulatory Visit | Attending: General Surgery

## 2021-10-29 ENCOUNTER — Encounter (HOSPITAL_COMMUNITY): Payer: Self-pay | Admitting: General Surgery

## 2021-10-29 ENCOUNTER — Ambulatory Visit (HOSPITAL_COMMUNITY): Payer: Medicare HMO | Admitting: Anesthesiology

## 2021-10-29 ENCOUNTER — Observation Stay (HOSPITAL_COMMUNITY)
Admission: RE | Admit: 2021-10-29 | Discharge: 2021-10-30 | Disposition: A | Discharge status: Home / Self Care | Payer: Medicare HMO | Source: Ambulatory Visit | Attending: General Surgery | Admitting: General Surgery

## 2021-10-29 ENCOUNTER — Other Ambulatory Visit: Payer: Self-pay

## 2021-10-29 DIAGNOSIS — I10 Essential (primary) hypertension: Secondary | ICD-10-CM | POA: Insufficient documentation

## 2021-10-29 DIAGNOSIS — K439 Ventral hernia without obstruction or gangrene: Secondary | ICD-10-CM

## 2021-10-29 DIAGNOSIS — Z933 Colostomy status: Secondary | ICD-10-CM | POA: Insufficient documentation

## 2021-10-29 DIAGNOSIS — F17210 Nicotine dependence, cigarettes, uncomplicated: Secondary | ICD-10-CM | POA: Diagnosis not present

## 2021-10-29 DIAGNOSIS — K432 Incisional hernia without obstruction or gangrene: Principal | ICD-10-CM | POA: Insufficient documentation

## 2021-10-29 DIAGNOSIS — R69 Illness, unspecified: Secondary | ICD-10-CM | POA: Diagnosis not present

## 2021-10-29 DIAGNOSIS — Z862 Personal history of diseases of the blood and blood-forming organs and certain disorders involving the immune mechanism: Secondary | ICD-10-CM

## 2021-10-29 HISTORY — PX: INCISIONAL HERNIA REPAIR: SHX193

## 2021-10-29 SURGERY — REPAIR, HERNIA, INCISIONAL
Anesthesia: General | Site: Abdomen

## 2021-10-29 MED ORDER — PROPOFOL 10 MG/ML IV BOLUS
INTRAVENOUS | Status: DC | PRN
  Administered 2021-10-29: 200 mg via INTRAVENOUS

## 2021-10-29 MED ORDER — CEFAZOLIN SODIUM-DEXTROSE 2-4 GM/100ML-% IV SOLN
2.0000 g | INTRAVENOUS | Status: AC
  Administered 2021-10-29: 09:00:00 2 g via INTRAVENOUS
  Filled 2021-10-29: qty 100

## 2021-10-29 MED ORDER — HYDROMORPHONE HCL 1 MG/ML IJ SOLN
INTRAMUSCULAR | Status: AC
  Filled 2021-10-29: qty 0.5

## 2021-10-29 MED ORDER — CHLORHEXIDINE GLUCONATE CLOTH 2 % EX PADS: 6.0000 | MEDICATED_PAD | Freq: Once | CUTANEOUS | Status: DC

## 2021-10-29 MED ORDER — MIDAZOLAM HCL 5 MG/5ML IJ SOLN
INTRAMUSCULAR | Status: DC | PRN
Start: 2021-10-29 — End: 2021-10-29
  Administered 2021-10-29: 2 mg via INTRAVENOUS

## 2021-10-29 MED ORDER — NITROGLYCERIN 0.4 MG SL SUBL: 0.4000 mg | SUBLINGUAL_TABLET | SUBLINGUAL | Status: DC | PRN

## 2021-10-29 MED ORDER — TIZANIDINE HCL 4 MG PO TABS
4.0000 mg | ORAL_TABLET | Freq: Three times a day (TID) | ORAL | Status: DC | PRN
  Administered 2021-10-29: 20:00:00 4 mg via ORAL
  Filled 2021-10-29: qty 1

## 2021-10-29 MED ORDER — HYDROMORPHONE HCL 1 MG/ML IJ SOLN
0.2500 mg | INTRAMUSCULAR | Status: AC | PRN
  Administered 2021-10-29 (×4): 0.5 mg via INTRAVENOUS
  Filled 2021-10-29 (×3): qty 0.5

## 2021-10-29 MED ORDER — ACETAMINOPHEN 500 MG PO TABS
1000.0000 mg | ORAL_TABLET | Freq: Four times a day (QID) | ORAL | Status: DC
  Filled 2021-10-29: qty 2

## 2021-10-29 MED ORDER — OXYCODONE HCL 5 MG PO TABS
5.0000 mg | ORAL_TABLET | ORAL | Status: DC | PRN
  Administered 2021-10-29 – 2021-10-30 (×4): 10 mg via ORAL
  Filled 2021-10-29 (×4): qty 2

## 2021-10-29 MED ORDER — KETOROLAC TROMETHAMINE 30 MG/ML IJ SOLN
INTRAMUSCULAR | Status: AC
  Filled 2021-10-29: qty 1

## 2021-10-29 MED ORDER — LORATADINE 10 MG PO TABS
10.0000 mg | ORAL_TABLET | Freq: Every day | ORAL | Status: DC
  Administered 2021-10-30: 08:00:00 10 mg via ORAL
  Filled 2021-10-29: qty 1

## 2021-10-29 MED ORDER — POLYETHYLENE GLYCOL 3350 17 G PO PACK: 17.0000 g | PACK | Freq: Two times a day (BID) | ORAL | Status: DC | PRN

## 2021-10-29 MED ORDER — LISINOPRIL 5 MG PO TABS
5.0000 mg | ORAL_TABLET | Freq: Every day | ORAL | Status: DC
  Filled 2021-10-29: qty 1

## 2021-10-29 MED ORDER — ROCURONIUM BROMIDE 10 MG/ML (PF) SYRINGE
PREFILLED_SYRINGE | INTRAVENOUS | Status: DC | PRN
Start: 2021-10-29 — End: 2021-10-29
  Administered 2021-10-29: 60 mg via INTRAVENOUS

## 2021-10-29 MED ORDER — DEXMEDETOMIDINE (PRECEDEX) IN NS 20 MCG/5ML (4 MCG/ML) IV SYRINGE
PREFILLED_SYRINGE | INTRAVENOUS | Status: DC | PRN
  Administered 2021-10-29 (×2): 10 ug via INTRAVENOUS

## 2021-10-29 MED ORDER — LIDOCAINE HCL (PF) 2 % IJ SOLN
INTRAMUSCULAR | Status: AC
  Filled 2021-10-29: qty 5

## 2021-10-29 MED ORDER — 0.9 % SODIUM CHLORIDE (POUR BTL) OPTIME
TOPICAL | Status: DC | PRN
  Administered 2021-10-29: 09:00:00 700 mL

## 2021-10-29 MED ORDER — ONDANSETRON 4 MG PO TBDP: 4.0000 mg | ORAL_TABLET | Freq: Four times a day (QID) | ORAL | Status: DC | PRN

## 2021-10-29 MED ORDER — KETAMINE HCL 50 MG/5ML IJ SOSY
PREFILLED_SYRINGE | INTRAMUSCULAR | Status: AC
  Filled 2021-10-29: qty 5

## 2021-10-29 MED ORDER — ORAL CARE MOUTH RINSE: 15.0000 mL | Freq: Once | OROMUCOSAL | Status: DC

## 2021-10-29 MED ORDER — DOCUSATE SODIUM 100 MG PO CAPS
100.0000 mg | ORAL_CAPSULE | Freq: Two times a day (BID) | ORAL | Status: DC
  Administered 2021-10-29 – 2021-10-30 (×2): 100 mg via ORAL
  Filled 2021-10-29 (×2): qty 1

## 2021-10-29 MED ORDER — SUGAMMADEX SODIUM 200 MG/2ML IV SOLN
INTRAVENOUS | Status: DC | PRN
Start: 2021-10-29 — End: 2021-10-29
  Administered 2021-10-29: 200 mg via INTRAVENOUS

## 2021-10-29 MED ORDER — ONDANSETRON HCL 4 MG/2ML IJ SOLN
INTRAMUSCULAR | Status: DC | PRN
  Administered 2021-10-29: 4 mg via INTRAVENOUS

## 2021-10-29 MED ORDER — FENTANYL CITRATE (PF) 100 MCG/2ML IJ SOLN
INTRAMUSCULAR | Status: AC
  Filled 2021-10-29: qty 2

## 2021-10-29 MED ORDER — DEXAMETHASONE SODIUM PHOSPHATE 10 MG/ML IJ SOLN
INTRAMUSCULAR | Status: AC
  Filled 2021-10-29: qty 1

## 2021-10-29 MED ORDER — LINACLOTIDE 145 MCG PO CAPS
290.0000 ug | ORAL_CAPSULE | Freq: Every day | ORAL | Status: DC
  Administered 2021-10-30: 08:00:00 290 ug via ORAL
  Filled 2021-10-29: qty 2

## 2021-10-29 MED ORDER — BUPIVACAINE LIPOSOME 1.3 % IJ SUSP
INTRAMUSCULAR | Status: DC | PRN
  Administered 2021-10-29: 20 mL

## 2021-10-29 MED ORDER — HYDROCHLOROTHIAZIDE 25 MG PO TABS
25.0000 mg | ORAL_TABLET | Freq: Every day | ORAL | Status: DC
  Administered 2021-10-29 – 2021-10-30 (×2): 25 mg via ORAL
  Filled 2021-10-29 (×2): qty 1

## 2021-10-29 MED ORDER — ROCURONIUM BROMIDE 10 MG/ML (PF) SYRINGE
PREFILLED_SYRINGE | INTRAVENOUS | Status: AC
  Filled 2021-10-29: qty 10

## 2021-10-29 MED ORDER — BUSPIRONE HCL 10 MG PO TABS
20.0000 mg | ORAL_TABLET | Freq: Three times a day (TID) | ORAL | Status: DC
  Filled 2021-10-29 (×5): qty 2

## 2021-10-29 MED ORDER — FENTANYL CITRATE (PF) 250 MCG/5ML IJ SOLN
INTRAMUSCULAR | Status: AC
  Filled 2021-10-29: qty 5

## 2021-10-29 MED ORDER — MIDAZOLAM HCL 2 MG/2ML IJ SOLN
INTRAMUSCULAR | Status: AC
  Filled 2021-10-29: qty 2

## 2021-10-29 MED ORDER — CHLORHEXIDINE GLUCONATE 0.12 % MT SOLN: 15.0000 mL | Freq: Once | OROMUCOSAL | Status: DC

## 2021-10-29 MED ORDER — KETAMINE HCL 10 MG/ML IJ SOLN
INTRAMUSCULAR | Status: DC | PRN
  Administered 2021-10-29 (×2): 10 mg via INTRAVENOUS

## 2021-10-29 MED ORDER — LIDOCAINE 2% (20 MG/ML) 5 ML SYRINGE
INTRAMUSCULAR | Status: DC | PRN
Start: 2021-10-29 — End: 2021-10-29
  Administered 2021-10-29: 100 mg via INTRAVENOUS

## 2021-10-29 MED ORDER — TRAZODONE HCL 50 MG PO TABS
50.0000 mg | ORAL_TABLET | Freq: Every evening | ORAL | Status: DC | PRN
  Administered 2021-10-29: 20:00:00 50 mg via ORAL
  Filled 2021-10-29: qty 1

## 2021-10-29 MED ORDER — FAMOTIDINE 20 MG PO TABS: 20.0000 mg | ORAL_TABLET | Freq: Every day | ORAL | Status: DC | PRN

## 2021-10-29 MED ORDER — MEPERIDINE HCL 50 MG/ML IJ SOLN: 6.2500 mg | INTRAMUSCULAR | Status: DC | PRN

## 2021-10-29 MED ORDER — LACTATED RINGERS IV SOLN: INTRAVENOUS | Status: DC

## 2021-10-29 MED ORDER — BUPIVACAINE LIPOSOME 1.3 % IJ SUSP
INTRAMUSCULAR | Status: AC
  Filled 2021-10-29: qty 20

## 2021-10-29 MED ORDER — ONDANSETRON HCL 4 MG/2ML IJ SOLN
INTRAMUSCULAR | Status: AC
  Filled 2021-10-29: qty 2

## 2021-10-29 MED ORDER — SIMETHICONE 80 MG PO CHEW: 40.0000 mg | CHEWABLE_TABLET | Freq: Four times a day (QID) | ORAL | Status: DC | PRN

## 2021-10-29 MED ORDER — METOPROLOL TARTRATE 5 MG/5ML IV SOLN: 5.0000 mg | Freq: Four times a day (QID) | INTRAVENOUS | Status: DC | PRN

## 2021-10-29 MED ORDER — HYDROMORPHONE HCL 1 MG/ML IJ SOLN
0.5000 mg | INTRAMUSCULAR | Status: DC | PRN
  Administered 2021-10-29 – 2021-10-30 (×2): 0.5 mg via INTRAVENOUS
  Filled 2021-10-29 (×2): qty 0.5

## 2021-10-29 MED ORDER — DIPHENHYDRAMINE HCL 12.5 MG/5ML PO ELIX: 12.5000 mg | ORAL_SOLUTION | Freq: Four times a day (QID) | ORAL | Status: DC | PRN

## 2021-10-29 MED ORDER — ONDANSETRON HCL 4 MG/2ML IJ SOLN
4.0000 mg | Freq: Once | INTRAMUSCULAR | Status: AC | PRN
  Administered 2021-10-29: 12:00:00 4 mg via INTRAVENOUS
  Filled 2021-10-29: qty 2

## 2021-10-29 MED ORDER — FENTANYL CITRATE (PF) 100 MCG/2ML IJ SOLN
INTRAMUSCULAR | Status: DC | PRN
  Administered 2021-10-29 (×4): 50 ug via INTRAVENOUS
  Administered 2021-10-29: 100 ug via INTRAVENOUS
  Administered 2021-10-29: 50 ug via INTRAVENOUS

## 2021-10-29 MED ORDER — ATENOLOL 25 MG PO TABS
50.0000 mg | ORAL_TABLET | Freq: Every day | ORAL | Status: DC
  Administered 2021-10-30: 08:00:00 50 mg via ORAL
  Filled 2021-10-29: qty 2

## 2021-10-29 MED ORDER — ONDANSETRON HCL 4 MG/2ML IJ SOLN: 4.0000 mg | Freq: Four times a day (QID) | INTRAMUSCULAR | Status: DC | PRN

## 2021-10-29 MED ORDER — BUSPIRONE HCL 5 MG PO TABS
20.0000 mg | ORAL_TABLET | Freq: Three times a day (TID) | ORAL | Status: DC
  Administered 2021-10-29 – 2021-10-30 (×3): 20 mg via ORAL
  Filled 2021-10-29: qty 4
  Filled 2021-10-29 (×5): qty 2
  Filled 2021-10-29: qty 4
  Filled 2021-10-29: qty 2

## 2021-10-29 MED ORDER — CITALOPRAM HYDROBROMIDE 20 MG PO TABS
40.0000 mg | ORAL_TABLET | Freq: Every day | ORAL | Status: DC
  Administered 2021-10-30: 08:00:00 40 mg via ORAL
  Filled 2021-10-29 (×3): qty 2

## 2021-10-29 MED ORDER — AMITRIPTYLINE HCL 25 MG PO TABS
50.0000 mg | ORAL_TABLET | Freq: Every day | ORAL | Status: DC
  Administered 2021-10-29: 16:00:00 50 mg via ORAL
  Filled 2021-10-29 (×3): qty 2

## 2021-10-29 MED ORDER — PROPOFOL 10 MG/ML IV BOLUS
INTRAVENOUS | Status: AC
  Filled 2021-10-29: qty 20

## 2021-10-29 MED ORDER — DIPHENHYDRAMINE HCL 50 MG/ML IJ SOLN: 12.5000 mg | Freq: Four times a day (QID) | INTRAMUSCULAR | Status: DC | PRN

## 2021-10-29 MED ORDER — DEXAMETHASONE SODIUM PHOSPHATE 10 MG/ML IJ SOLN
INTRAMUSCULAR | Status: DC | PRN
Start: 2021-10-29 — End: 2021-10-29
  Administered 2021-10-29: 5 mg via INTRAVENOUS

## 2021-10-29 SURGICAL SUPPLY — 46 items
BINDER ABDOMINAL  9 SM 30-45 (SOFTGOODS) ×3
BINDER ABDOMINAL 9 SM 30-45 (SOFTGOODS) IMPLANT
BLADE SURG SZ11 CARB STEEL (BLADE) ×3 IMPLANT
CHLORAPREP W/TINT 26 (MISCELLANEOUS) ×3 IMPLANT
CLOTH BEACON ORANGE TIMEOUT ST (SAFETY) ×3 IMPLANT
COVER LIGHT HANDLE STERIS (MISCELLANEOUS) ×6 IMPLANT
DERMABOND ADVANCED (GAUZE/BANDAGES/DRESSINGS) ×2
DERMABOND ADVANCED .7 DNX12 (GAUZE/BANDAGES/DRESSINGS) ×1 IMPLANT
ELECT REM PT RETURN 9FT ADLT (ELECTROSURGICAL) ×3
ELECTRODE REM PT RTRN 9FT ADLT (ELECTROSURGICAL) ×1 IMPLANT
GAUZE 4X4 16PLY ~~LOC~~+RFID DBL (SPONGE) ×2 IMPLANT
GAUZE SPONGE 4X4 12PLY STRL (GAUZE/BANDAGES/DRESSINGS) ×3 IMPLANT
GLOVE SURG ENC MOIS LTX SZ6.5 (GLOVE) ×3 IMPLANT
GLOVE SURG POLYISO LF SZ7.5 (GLOVE) ×3 IMPLANT
GLOVE SURG UNDER POLY LF SZ7 (GLOVE) ×6 IMPLANT
GOWN STRL REUS W/TWL LRG LVL3 (GOWN DISPOSABLE) ×9 IMPLANT
INST SET MINOR GENERAL (KITS) ×3 IMPLANT
KIT TURNOVER KIT A (KITS) ×3 IMPLANT
MANIFOLD NEPTUNE II (INSTRUMENTS) ×3 IMPLANT
MESH VENTRALEX ST 8CM LRG (Mesh General) ×2 IMPLANT
NDL HYPO 21X1.5 SAFETY (NEEDLE) ×1 IMPLANT
NEEDLE HYPO 21X1.5 SAFETY (NEEDLE) ×3 IMPLANT
NS IRRIG 1000ML POUR BTL (IV SOLUTION) ×3 IMPLANT
PACK MINOR (CUSTOM PROCEDURE TRAY) ×3 IMPLANT
PAD ABD 8X10 STRL (GAUZE/BANDAGES/DRESSINGS) ×2 IMPLANT
PAD ARMBOARD 7.5X6 YLW CONV (MISCELLANEOUS) ×3 IMPLANT
SET BASIN LINEN APH (SET/KITS/TRAYS/PACK) ×3 IMPLANT
SPONGE T-LAP 18X18 ~~LOC~~+RFID (SPONGE) ×4 IMPLANT
SUT ETHIBOND 0 MO6 C/R (SUTURE) ×4 IMPLANT
SUT MNCRL AB 4-0 PS2 18 (SUTURE) ×3 IMPLANT
SUT NOVA NAB GS-21 1 T12 (SUTURE) IMPLANT
SUT NOVA NAB GS-22 2 2-0 T-19 (SUTURE) IMPLANT
SUT NOVA NAB GS-26 0 60 (SUTURE) IMPLANT
SUT PROLENE 0 CT 1 CR/8 (SUTURE) IMPLANT
SUT PROLENE 2 0 SH 30 (SUTURE) IMPLANT
SUT SILK 2 0 (SUTURE)
SUT SILK 2-0 18XBRD TIE 12 (SUTURE) IMPLANT
SUT SILK 3 0 (SUTURE)
SUT SILK 3-0 18XBRD TIE 12 (SUTURE) IMPLANT
SUT VIC AB 2-0 CT1 27 (SUTURE) ×3
SUT VIC AB 2-0 CT1 TAPERPNT 27 (SUTURE) ×1 IMPLANT
SUT VIC AB 3-0 SH 27 (SUTURE) ×3
SUT VIC AB 3-0 SH 27X BRD (SUTURE) ×1 IMPLANT
SUT VIC AB 4-0 PS2 27 (SUTURE) IMPLANT
SUT VICRYL AB 2 0 TIES (SUTURE) IMPLANT
SYR 20ML LL LF (SYRINGE) ×3 IMPLANT

## 2021-10-29 NOTE — Anesthesia Procedure Notes (Addendum)
Procedure Name: Intubation Date/Time: 10/29/2021 9:01 AM Performed by: Orlie Dakin, CRNA Pre-anesthesia Checklist: Patient identified, Emergency Drugs available, Suction available and Patient being monitored Patient Re-evaluated:Patient Re-evaluated prior to induction Oxygen Delivery Method: Circle system utilized Preoxygenation: Pre-oxygenation with 100% oxygen Induction Type: IV induction Ventilation: Mask ventilation without difficulty and Oral airway inserted - appropriate to patient size Laryngoscope Size: Sabra Heck and 3 Grade View: Grade II Tube type: Oral Tube size: 7.0 mm Number of attempts: 1 Airway Equipment and Method: Stylet Placement Confirmation: ETT inserted through vocal cords under direct vision, positive ETCO2 and breath sounds checked- equal and bilateral Secured at: 21 cm Tube secured with: Tape Dental Injury: Teeth and Oropharynx as per pre-operative assessment  Comments: 4x4s bite block used.

## 2021-10-29 NOTE — Anesthesia Postprocedure Evaluation (Signed)
Anesthesia Post Note  Patient: [name]  Procedure(s) Performed: HERNIA REPAIR INCISIONAL; OPEN; WITH MESH (Abdomen)  Patient location during evaluation: PACU Anesthesia Type: General Level of consciousness: awake and alert and oriented Pain management: pain level controlled Vital Signs Assessment: post-procedure vital signs reviewed and stable Respiratory status: spontaneous breathing, nonlabored ventilation and respiratory function stable Cardiovascular status: blood pressure returned to baseline and stable Postop Assessment: no apparent nausea or vomiting Anesthetic complications: no   No notable events documented.   Last Vitals:  Vitals:   10/29/21 1400 10/29/21 1415  BP: 124/81   Pulse: 83 89  Resp: 16 (!) 22  Temp:    SpO2: 97% 96%    Last Pain:  Vitals:   10/29/21 1400  TempSrc:   PainSc: 2                  Dawson Hollman C Slyvester Latona

## 2021-10-29 NOTE — Progress Notes (Signed)
[facility]  Updated sister. Hernia repaired with retrorectus mesh and fascial closure. Admission overnight. No heparin or lovenox. SCDs. Labs in AM. Home meds ordered.  [name], MD [facility] [address] [place], [place] [address] [phone] (office)

## 2021-10-29 NOTE — Op Note (Signed)
[facility] Operative Note  10/29/21  Preoperative Diagnosis: Incisional hernia in left lower quadrant from prior colostomy    Postoperative Diagnosis: Same   Procedure(s) Performed: Incisional hernia repair with posterior rectus fascia flap mobilization and primary closure,  placement of mesh (Ventralex ST Hernia patch 8cm) in the retro-rectus space, anterior rectus fascia flap mobilization and primary closure    Surgeon: Lanell Matar. Constance Haw, MD   Assistants: No qualified resident was available    Anesthesia: General endotracheal   Anesthesiologist: Dr. Charna Elizabeth    Specimens: None    Estimated Blood Loss: Minimal   Blood Replacement: None    Complications: None   Wound Class: Clean    Operative Indications: [name] is a very sweet 60 yo who has an incisional hernia at her colostomy site. She has chronic constipation but feels like this is worse over the last few weeks. She reports some discomfort in the left lower quadrant. CT confirmed the hernia and no issues with colonic anastomosis. We discussed hernia repair with mesh and risk of bleeding, infection, recurrence, use of mesh, possibly injury of bowel and primary repair. Discussed issues with smoking and wound healing. She has cut back significantly but wants to proceed with repair and understands the higher risk of recurrence.   Findings: Incisional hernia in left lower quadrant, posterior rectus fascia mobilized and closed; rectro-rectus mesh placed, anterior rectus mobilized and closed   Procedure: The patient was taken to the operating room and placed supine. General endotracheal anesthesia was induced. Intravenous antibiotics were  administered per protocol.  An orogastric tube positioned to decompress the stomach. The abdomen was prepared and draped in the usual sterile fashion.   The prior left lower quadrant incision was excised with an elliptical incision to include the slight dog ears on the  lateral edges. The subcutaneous fat was opened up and the dissection was carried down to the fascia using sharp cautery and blunt dissection, ensuring that that peritoneum was not entered inadvertently given the colon that was in the hernia.  Once I had the hernia sac freed up, I verified that it was in fact hernia sac, and opened the hernia sac up. The excess hernia sac was excised with cautery along the edges of the fascia.    I proceed to clear the peritoneum under the hernia which was mostly adherent omentum with cautery. Once I was able to get this cleared, I mobilized the posterior rectus fascia to allow for closure of the posterior fascia. This was mobilized with sharp dissection with scissors from the rectus muscle. The posterior rectus sheath was closed with interrupted 0 Ethibond sutures.  This was closed without any tension.  I think placed a retro-rectus Ventralex ST patch 8cm in the space. This covered the space well and covered the entire posterior rectus closure with some overlap on the lateral and medial edges.  The mesh was secured to the posterior fascia with 0 Ethibond interrupted in the 12, 3, 6, and 9 o'clock positions to prevent it from slipping or folding. The anterior rectus fascia was then mobilized in a similar fashion and was closed with 0 Ethibond interrupted sutures. This again had no tension.  In each layer hemostasis was achieved and Exparel was injected as I went.   The deep subcutaneous space was closed with 2-0 interrupted Vicryl suture and the superficial subcutaneous space was closed with 3-0 interrupted Vicryl suture. The skin was closed with a running subcuticular 4-0 Monocryl and dermabond. An abdominal binder  was placed.   Final inspection revealed acceptable hemostasis. All counts were correct at the end of the case. The patient was awakened from anesthesia and extubated without complication.  The patient went to the PACU in stable condition.   [name],  MD [facility] [address] [place], [place] [address] [phone] (office)

## 2021-10-29 NOTE — Interval H&P Note (Signed)
History and Physical Interval Note:  10/29/2021 8:46 AM  [name]  has presented today for surgery, with the diagnosis of Incisional hernia.  The various methods of treatment have been discussed with the patient and family. After consideration of risks, benefits and other options for treatment, the patient has consented to  Procedure(s): HERNIA REPAIR INCISIONAL; OPEN; WITH MESH (N/A) as a surgical intervention.  The patient's history has been reviewed, patient examined, no change in status, stable for surgery.  I have reviewed the patient's chart and labs.  Questions were answered to the patient's satisfaction.    Have discussed at length risk of hernia repair with smoking, she has cut back significantly, she understands that this puts her at some more risk of recurrence and she is going to continue to try to avoid smoking to allow for better healing. She has a history of ITP and we will avoid heparin/ lovenox this admission to just be safe. Home tomorrow. Patient suffers from constipation and takes linzess has had more constipation and discomfort recently and does have colon in the hernia, so likely causing some issues in addition to her chronic constipation.   [name]

## 2021-10-29 NOTE — Transfer of Care (Signed)
Immediate Anesthesia Transfer of Care Note  Patient: [name]  Procedure(s) Performed: HERNIA REPAIR INCISIONAL; OPEN; WITH MESH (Abdomen)  Patient Location: PACU  Anesthesia Type:General  Level of Consciousness: awake and alert   Airway & Oxygen Therapy: Patient Spontanous Breathing and Patient connected to face mask oxygen  Post-op Assessment: Report given to RN and Post -op Vital signs reviewed and stable  Post vital signs: Reviewed and stable  Last Vitals:  Vitals Value Taken Time  BP 133/73 10/29/21 1049  Temp    Pulse 85 10/29/21 1050  Resp 25 10/29/21 1051  SpO2 99 % 10/29/21 1050  Vitals shown include unvalidated device data.  Last Pain:  Vitals:   10/29/21 0820  TempSrc: Oral  PainSc: 0-No pain         Complications: No notable events documented.

## 2021-10-29 NOTE — Anesthesia Preprocedure Evaluation (Signed)
Anesthesia Evaluation  Patient identified by MRN, date of birth, ID band Patient awake    Reviewed: Allergy & Precautions, NPO status , Patient's Chart, lab work & pertinent test results, reviewed documented beta blocker date and time   History of Anesthesia Complications Negative for: history of anesthetic complications  Airway Mallampati: II  TM Distance: >3 FB Neck ROM: Limited   Comment: Neck sx3, fusion Dental  (+) Dental Advisory Given, Chipped,  Broken teethx3:   Pulmonary shortness of breath and with exertion, pneumonia, Current SmokerPatient did not abstain from smoking.,    Pulmonary exam normal breath sounds clear to auscultation       Cardiovascular Exercise Tolerance: Good hypertension, Pt. on medications and Pt. on home beta blockers Normal cardiovascular exam Rhythm:Regular Rate:Normal  Left ventricle: The cavity size was normal. Wall thickness was increased in a pattern of mild LVH. Systolic function was normal.  The estimated ejection fraction was in the range of 60% to 65%.  Wall motion was normal; there were no regional wall motionabnormalities. Features are consistent with a pseudonormal left ventricular filling pattern, with concomitant abnormal relaxation and increased filling pressure (grade 2 diastolic dysfunction).  Indeterminate filling pressures.  - Aortic valve: There was mild regurgitation.  - Mitral valve: There was mild regurgitation.  - Systemic veins: Dilated IVC with normal respiratory variation.  Estimated CVP 8 mmHg.   21-Dec-2020 16:45:51 [facility] ROUTINE RECORD Sinus tachycardia Abnormal R-wave progression, early transition Nonspecific repol abnormality, diffuse leads Prolonged QT interval Baseline wander in lead(s) II    Neuro/Psych  Headaches, Seizures -, Well Controlled,  PSYCHIATRIC DISORDERS Anxiety Depression  Neuromuscular disease    GI/Hepatic PUD,  GERD  Medicated,  Endo/Other    Renal/GU Renal disease     Musculoskeletal  (+) Arthritis , Osteoarthritis,  Neck sx3, fusion, back sx   Abdominal   Peds  Hematology  (+) Blood dyscrasia (ITP), anemia ,   Anesthesia Other Findings   Reproductive/Obstetrics                             Anesthesia Physical  Anesthesia Plan  ASA: 3  Anesthesia Plan: General   Post-op Pain Management:    Induction: Intravenous  PONV Risk Score and Plan: 4 or greater and Ondansetron, Dexamethasone and Midazolam  Airway Management Planned: Oral ETT  Additional Equipment:   Intra-op Plan:   Post-operative Plan: Extubation in OR  Informed Consent: I have reviewed the patients History and Physical, chart, labs and discussed the procedure including the risks, benefits and alternatives for the proposed anesthesia with the patient or authorized representative who has indicated his/her understanding and acceptance.     Dental advisory given  Plan Discussed with: CRNA and Surgeon  Anesthesia Plan Comments:         Anesthesia Quick Evaluation

## 2021-10-30 ENCOUNTER — Encounter: Payer: Self-pay | Admitting: Family Medicine

## 2021-10-30 ENCOUNTER — Encounter (HOSPITAL_COMMUNITY): Payer: Self-pay | Admitting: General Surgery

## 2021-10-30 DIAGNOSIS — K432 Incisional hernia without obstruction or gangrene: Secondary | ICD-10-CM | POA: Diagnosis not present

## 2021-10-30 DIAGNOSIS — I10 Essential (primary) hypertension: Secondary | ICD-10-CM | POA: Diagnosis not present

## 2021-10-30 DIAGNOSIS — R69 Illness, unspecified: Secondary | ICD-10-CM | POA: Diagnosis not present

## 2021-10-30 DIAGNOSIS — Z933 Colostomy status: Secondary | ICD-10-CM | POA: Diagnosis not present

## 2021-10-30 LAB — CBC
HCT: 42.6 % (ref 36.0–46.0)
Hemoglobin: 13.9 g/dL (ref 12.0–15.0)
MCH: 30.2 pg (ref 26.0–34.0)
MCHC: 32.6 g/dL (ref 30.0–36.0)
MCV: 92.4 fL (ref 80.0–100.0)
Platelets: 247 10*3/uL (ref 150–400)
RBC: 4.61 MIL/uL (ref 3.87–5.11)
RDW: 13.8 % (ref 11.5–15.5)
WBC: 13.8 10*3/uL — ABNORMAL HIGH (ref 4.0–10.5)
nRBC: 0 % (ref 0.0–0.2)

## 2021-10-30 LAB — CBC WITH DIFFERENTIAL/PLATELET
Abs Immature Granulocytes: 0.11 10*3/uL — ABNORMAL HIGH (ref 0.00–0.07)
Basophils Absolute: 0 10*3/uL (ref 0.0–0.1)
Basophils Relative: 0 %
Eosinophils Absolute: 0.1 10*3/uL (ref 0.0–0.5)
Eosinophils Relative: 1 %
HCT: 39.9 % (ref 36.0–46.0)
Hemoglobin: 12.7 g/dL (ref 12.0–15.0)
Immature Granulocytes: 1 %
Lymphocytes Relative: 18 %
Lymphs Abs: 2.3 10*3/uL (ref 0.7–4.0)
MCH: 28.9 pg (ref 26.0–34.0)
MCHC: 31.8 g/dL (ref 30.0–36.0)
MCV: 90.7 fL (ref 80.0–100.0)
Monocytes Absolute: 1.5 10*3/uL — ABNORMAL HIGH (ref 0.1–1.0)
Monocytes Relative: 12 %
Neutro Abs: 9 10*3/uL — ABNORMAL HIGH (ref 1.7–7.7)
Neutrophils Relative %: 68 %
Platelets: 234 10*3/uL (ref 150–400)
RBC: 4.4 MIL/uL (ref 3.87–5.11)
RDW: 13.6 % (ref 11.5–15.5)
WBC: 13.1 10*3/uL — ABNORMAL HIGH (ref 4.0–10.5)
nRBC: 0 % (ref 0.0–0.2)

## 2021-10-30 LAB — BASIC METABOLIC PANEL
Anion gap: 8 (ref 5–15)
BUN: 11 mg/dL (ref 8–23)
CO2: 32 mmol/L (ref 22–32)
Calcium: 8.6 mg/dL — ABNORMAL LOW (ref 8.9–10.3)
Chloride: 96 mmol/L — ABNORMAL LOW (ref 98–111)
Creatinine, Ser: 0.72 mg/dL (ref 0.44–1.00)
GFR, Estimated: >60 mL/min (ref >60)
Glucose, Bld: 167 mg/dL — ABNORMAL HIGH (ref 70–99)
Potassium: 3.2 mmol/L — ABNORMAL LOW (ref 3.5–5.1)
Sodium: 136 mmol/L (ref 135–145)

## 2021-10-30 NOTE — Discharge Summary (Signed)
Physician Discharge Summary  Patient ID: [name] MRN: [id number] DOB/AGE: 1961/08/04 60 y.o.  Admit date: 10/29/2021 Discharge date: 10/30/2021  Admission Diagnoses: Incisional hernia   Discharge Diagnoses:  Principal Problem:   Ventral hernia without obstruction or gangrene Active Problems:   History of ITP   Incisional hernia, without obstruction or gangrene   Discharged Condition: good  Hospital Course: [name] is a 60 yo with a incisional hernia at her left sided colostomy site and chronic constipation. She has a history of ITP and she was monitored overnight. Her repeat labs this AM does show a drift in H&H likely some of this is dilutional and a reasonably stable platelet count.  She has no signs of bleeding and is hemodynamically stable.  Given her history a repeat CBC was done before discharge.  Her H&H and platelets were stable. She was ready to be dc home. She was eating, ambulating and had good pain control.    Consults: None  Significant Diagnostic Studies:  CBC Latest Ref Rng & Units 10/30/2021 10/30/2021 10/27/2021  WBC 4.0 - 10.5 K/uL 13.8(H) 13.1(H) 9.1  Hemoglobin 12.0 - 15.0 g/dL 13.9 12.7 15.3(H)  Hematocrit 36.0 - 46.0 % 42.6 39.9 46.3(H)  Platelets 150 - 400 K/uL 247 234 288     Treatments: 10/29/2021- incisional hernia repair with mesh   Discharge Exam: Blood pressure (!) 143/73, pulse 89, temperature 98 F (36.7 C), temperature source Oral, resp. rate 20, height 5\' 4"  (1.626 m), weight 84.3 kg, SpO2 95 %. General appearance: alert and no distress Resp: normal work of breathing GI: soft, mildly distended, appropriately tender, incision c/d/I with dermabond, binder in place  Disposition: Discharge disposition: 01-Home or Self Care       Discharge Instructions     Call MD for:  difficulty breathing, headache or visual disturbances   Complete by: As directed    Call MD for:  persistant dizziness or light-headedness   Complete by:  As directed    Call MD for:  persistant nausea and vomiting   Complete by: As directed    Call MD for:  redness, tenderness, or signs of infection (pain, swelling, redness, odor or green/yellow discharge around incision site)   Complete by: As directed    Call MD for:  severe uncontrolled pain   Complete by: As directed    Call MD for:  temperature >100.4   Complete by: As directed    Increase activity slowly   Complete by: As directed       Allergies as of 10/30/2021       Reactions   Nsaids Nausea Only   Abdominal pain        Medication List     TAKE these medications    amitriptyline 25 MG tablet Commonly known as: ELAVIL Take 25-50 mg by mouth See admin instructions. Take 50 mg by mouth at bedtime, may take 25-50 mg during the day if needed for pain   atenolol 50 MG tablet Commonly known as: TENORMIN TAKE 1 TABLET BY MOUTH EVERY DAY   busPIRone 15 MG tablet Commonly known as: BUSPAR Take 1 tablet (15 mg total) by mouth 3 (three) times daily. TAKE 1 TABLET WITH THE 5MG  TO EQUAL 20MG .   busPIRone 5 MG tablet Commonly known as: BUSPAR TAKE 1 TABLET BY MOUTH 3 TIMES DAILY. TAKE 1 TABLET WITH THE 15MG  TO EQUAL 20MG .   cetirizine 10 MG tablet Commonly known as: ZYRTEC Take 10 mg by mouth daily.  citalopram 40 MG tablet Commonly known as: CELEXA TAKE 1 TABLET BY MOUTH DAILY WITH BREAKFAST.   famotidine 20 MG tablet Commonly known as: PEPCID Take 1 tablet (20 mg total) by mouth daily as needed for heartburn or indigestion.   hydrochlorothiazide 25 MG tablet Commonly known as: HYDRODIURIL Take 1 tablet (25 mg total) by mouth daily.   latanoprost 0.005 % ophthalmic solution Commonly known as: XALATAN Place 1 drop into both eyes at bedtime.   linaclotide 290 MCG Caps capsule Commonly known as: Linzess Take 1 capsule (290 mcg total) by mouth daily before breakfast.   lisinopril 10 MG tablet Commonly known as: ZESTRIL Take 0.5-1 tablets (5-10 mg total)  by mouth daily. As directed   nitroGLYCERIN 0.4 MG SL tablet Commonly known as: NITROSTAT Place 1 tablet (0.4 mg total) under the tongue every 5 (five) minutes as needed for chest pain.   ondansetron 4 MG tablet Commonly known as: ZOFRAN Take 4 mg by mouth every 8 (eight) hours as needed for nausea.   Oxycodone HCl 10 MG Tabs Take 1 tablet (10 mg total) by mouth 2 (two) times daily. What changed: when to take this   polyethylene glycol powder 17 GM/SCOOP powder Commonly known as: MiraLax Take 17 g by mouth in the morning and at bedtime.   predniSONE 5 MG (21) Tbpk tablet Commonly known as: STERAPRED UNI-PAK 21 TAB TAKE 6 TABLETS ON DAY 1 AS DIRECTED ON PACKAGE AND DECREASE BY 1 TAB EACH DAY FOR A TOTAL OF 6 DAYS   tiZANidine 4 MG tablet Commonly known as: ZANAFLEX Take 1 tablet (4 mg total) by mouth every 8 (eight) hours as needed for muscle spasms.   traZODone 100 MG tablet Commonly known as: DESYREL TAKE 1 AND 1/2 TABLET (150 MG TOTAL) BY MOUTH AT BEDTIME AS NEEDED FOR SLEEP.          Signed: Virl Cagey 10/30/2021, 11:48 AM

## 2021-10-30 NOTE — Progress Notes (Signed)
°  Transition of Care [facility]) Screening Note   Patient Details  Name: [name] Date of Birth: August 20, 1961   Transition of Care [facility]) CM/SW Contact:    Boneta Lucks, RN Phone Number: 10/30/2021, 11:57 AM    Transition of Care Department [facility]) has reviewed patient and no TOC needs have been identified at this time. We will continue to monitor patient advancement through interdisciplinary progression rounds. If new patient transition needs arise, please place a TOC consult.

## 2021-10-30 NOTE — Progress Notes (Signed)
Nsg Discharge Note  Admit Date:  10/29/2021 Discharge date: 10/30/2021   [name] to be D/C'd Home per MD order.  AVS completed.   Patient/caregiver able to verbalize understanding.  Discharge Medication: Allergies as of 10/30/2021       Reactions   Nsaids Nausea Only   Abdominal pain        Medication List     TAKE these medications    amitriptyline 25 MG tablet Commonly known as: ELAVIL Take 25-50 mg by mouth See admin instructions. Take 50 mg by mouth at bedtime, may take 25-50 mg during the day if needed for pain   atenolol 50 MG tablet Commonly known as: TENORMIN TAKE 1 TABLET BY MOUTH EVERY DAY   busPIRone 15 MG tablet Commonly known as: BUSPAR Take 1 tablet (15 mg total) by mouth 3 (three) times daily. TAKE 1 TABLET WITH THE 5MG  TO EQUAL 20MG .   busPIRone 5 MG tablet Commonly known as: BUSPAR TAKE 1 TABLET BY MOUTH 3 TIMES DAILY. TAKE 1 TABLET WITH THE 15MG  TO EQUAL 20MG .   cetirizine 10 MG tablet Commonly known as: ZYRTEC Take 10 mg by mouth daily.   citalopram 40 MG tablet Commonly known as: CELEXA TAKE 1 TABLET BY MOUTH DAILY WITH BREAKFAST.   famotidine 20 MG tablet Commonly known as: PEPCID Take 1 tablet (20 mg total) by mouth daily as needed for heartburn or indigestion.   hydrochlorothiazide 25 MG tablet Commonly known as: HYDRODIURIL Take 1 tablet (25 mg total) by mouth daily.   latanoprost 0.005 % ophthalmic solution Commonly known as: XALATAN Place 1 drop into both eyes at bedtime.   linaclotide 290 MCG Caps capsule Commonly known as: Linzess Take 1 capsule (290 mcg total) by mouth daily before breakfast.   lisinopril 10 MG tablet Commonly known as: ZESTRIL Take 0.5-1 tablets (5-10 mg total) by mouth daily. As directed   nitroGLYCERIN 0.4 MG SL tablet Commonly known as: NITROSTAT Place 1 tablet (0.4 mg total) under the tongue every 5 (five) minutes as needed for chest pain.   ondansetron 4 MG tablet Commonly known as:  ZOFRAN Take 4 mg by mouth every 8 (eight) hours as needed for nausea.   Oxycodone HCl 10 MG Tabs Take 1 tablet (10 mg total) by mouth 2 (two) times daily. What changed: when to take this   polyethylene glycol powder 17 GM/SCOOP powder Commonly known as: MiraLax Take 17 g by mouth in the morning and at bedtime.   predniSONE 5 MG (21) Tbpk tablet Commonly known as: STERAPRED UNI-PAK 21 TAB TAKE 6 TABLETS ON DAY 1 AS DIRECTED ON PACKAGE AND DECREASE BY 1 TAB EACH DAY FOR A TOTAL OF 6 DAYS   tiZANidine 4 MG tablet Commonly known as: ZANAFLEX Take 1 tablet (4 mg total) by mouth every 8 (eight) hours as needed for muscle spasms.   traZODone 100 MG tablet Commonly known as: DESYREL TAKE 1 AND 1/2 TABLET (150 MG TOTAL) BY MOUTH AT BEDTIME AS NEEDED FOR SLEEP.        Discharge Assessment: Vitals:   10/30/21 0514 10/30/21 0800  BP: 136/68 (!) 143/73  Pulse: 85 89  Resp: 20   Temp: 98 F (36.7 C)   SpO2: 95%    Skin clean, dry and intact without evidence of skin break down, no evidence of skin tears noted. IV catheter discontinued intact. Site without signs and symptoms of complications - no redness or edema noted at insertion site, patient denies c/o pain -  only slight tenderness at site.  Dressing with slight pressure applied.  D/c Instructions-Education: Discharge instructions given to patient/family with verbalized understanding. D/c education completed with patient/family including follow up instructions, medication list, d/c activities limitations if indicated, with other d/c instructions as indicated by MD - patient able to verbalize understanding, all questions fully answered. Patient instructed to return to ED, call 911, or call MD for any changes in condition.  Patient escorted via [place], and D/C home via private auto.  Kathie Rhodes, RN 10/30/2021 12:43 PM

## 2021-10-30 NOTE — Discharge Instructions (Signed)
Discharge Instructions:  Common Complaints: Pain at the incision site is common. This will improve with time. Take your pain medications as described below. Some nausea is common and poor appetite. The main goal is to stay hydrated the first few days after surgery.  Some bruising is common.    Diet/ Activity: Diet as tolerated. You have started and tolerated a diet in the hospital, and should continue to increase what you are able to eat.   You may not have a large appetite, but it is important to stay hydrated. Drink 64 ounces of water a day. Your appetite will return with time.  Keep a dry dressing in place over your staples daily or as needed. Some minor pink/ blood tinged drainage is expected. This will stop in a few days after surgery.  Shower per your regular routine daily.  Do not take hot showers. Take warm showers that are less than 10 minutes. Pat the incision dry. Wear an abdominal binder daily with activity. You do not have to wear this while sleeping or sitting.  Rest and listen to your body, but do not remain in bed all day.  Walk everyday for at least 15-20 minutes. Deep cough and move around every 1-2 hours in the first few days after surgery.  Do not lift > 10 lbs, perform excessive bending, pushing, pulling, squatting for 6-8 weeks after surgery.  The activity restrictions and the abdominal binder are to prevent hernia formation at your incision while you are healing.  Do not place lotions or balms on your incision unless instructed to specifically by Dr. Constance Haw.   Pain Expectations and Narcotics: -After surgery you will have pain associated with your incisions and this is normal. The pain is muscular and nerve pain, and will get better with time. -You are encouraged and expected to take non narcotic medications like tylenol and ibuprofen (when able) to treat pain as multiple modalities can aid with pain treatment. -Narcotics are only used when pain is severe or there is  breakthrough pain. -You are not expected to have a pain score of 0 after surgery, as we cannot prevent pain. A pain score of 3-4 that allows you to be functional, move, walk, and tolerate some activity is the goal. The pain will continue to improve over the days after surgery and is dependent on your surgery. -Due to [place] law, we are only able to give a certain amount of pain medication to treat post operative pain, and we only give additional narcotics on a patient by patient basis.  -For most laparoscopic surgery, studies have shown that the majority of patients only need 10-15 narcotic pills, and for open surgeries most patients only need 15-20.   -Having appropriate expectations of pain and knowledge of pain management with non narcotics is important as we do not want anyone to become addicted to narcotic pain medication.  -Using ice packs in the first 48 hours and heating pads after 48 hours, wearing an abdominal binder (when recommended), and using over the counter medications are all ways to help with pain management.   -Simple acts like meditation and mindfulness practices after surgery can also help with pain control and research has proven the benefit of these practices.  Medication: Take tylenol and ibuprofen as needed for pain control, alternating every 4-6 hours.  Example:  Tylenol 1000mg  @ 6am, 12noon, 6pm, 22midnight (Do not exceed 4000mg  of tylenol a day). Ibuprofen 800mg  @ 9am, 3pm, 9pm, 3am (Do not exceed 3600mg  of  ibuprofen a day).  Take your home roxicodone for breakthrough pain as prescribed.  Take your home bowel regimen for constipation.   Contact Information: If you have questions or concerns, please call our office, [phone], Monday- Thursday 8AM-5PM and Friday 8AM-12Noon.  If it is after hours or on the weekend, please call [facility] Main Number, [phone], [phone], and ask to speak to the surgeon on call for Dr. Constance Haw at [facility].

## 2021-10-30 NOTE — Care Management Obs Status (Signed)
[facility] NOTIFICATION   Patient Details  Name: [name] MRN: [id number] Date of Birth: [birth year]   Medicare Observation Status Notification Given:  Yes (late entry, letter mailed to address on file)    Tommy Medal 10/30/2021, 1:17 PM

## 2021-11-05 ENCOUNTER — Telehealth: Payer: Self-pay | Admitting: *Deleted

## 2021-11-05 MED ORDER — MAGIC MOUTHWASH: ORAL | 0 refills | Status: DC

## 2021-11-05 NOTE — Telephone Encounter (Addendum)
Received call from patient [phone]~ telephone.   Surgical Date: 10/29/2021 Procedure: Incisional Hernia Repair  Concerns: patient reports that she has mouth irritation and a white film to mouth and tongue. States that she has burning and mouth feels raw. Requested order for magic mouthwash.  Allergies: NSAIDS- nausea/ abd pain Pharmacy: [facility]  Provider made aware and agreable to plan.   Prescription called to pharmacy for Benadryl, Nystatin, Lidocaine 1:1 ratio- swish and swallow 70mL PO QID x5 days, then PRN- mouth pain, 185mL- 0 RF.

## 2021-11-07 ENCOUNTER — Encounter: Payer: Self-pay | Admitting: Family Medicine

## 2021-11-07 NOTE — Telephone Encounter (Signed)
Patient called confused about samples and called to see if we had some at the front and I checked and told her she had some. She wanted me to put message in to asked Dr. Lajuana Ripple if she is diabetic. Never has been told that.

## 2021-11-14 ENCOUNTER — Other Ambulatory Visit: Payer: Self-pay | Admitting: Family Medicine

## 2021-11-14 DIAGNOSIS — M542 Cervicalgia: Secondary | ICD-10-CM | POA: Diagnosis not present

## 2021-11-14 DIAGNOSIS — I10 Essential (primary) hypertension: Secondary | ICD-10-CM

## 2021-11-14 DIAGNOSIS — M4322 Fusion of spine, cervical region: Secondary | ICD-10-CM | POA: Diagnosis not present

## 2021-11-14 DIAGNOSIS — F411 Generalized anxiety disorder: Secondary | ICD-10-CM

## 2021-11-14 DIAGNOSIS — M4326 Fusion of spine, lumbar region: Secondary | ICD-10-CM | POA: Diagnosis not present

## 2021-11-14 DIAGNOSIS — G894 Chronic pain syndrome: Secondary | ICD-10-CM | POA: Diagnosis not present

## 2021-11-18 ENCOUNTER — Other Ambulatory Visit: Payer: Self-pay | Admitting: Orthopaedic Surgery

## 2021-11-18 DIAGNOSIS — G894 Chronic pain syndrome: Secondary | ICD-10-CM

## 2021-11-20 ENCOUNTER — Ambulatory Visit (INDEPENDENT_AMBULATORY_CARE_PROVIDER_SITE_OTHER): Payer: Medicare HMO

## 2021-11-20 VITALS — Ht 64.0 in | Wt 185.0 lb

## 2021-11-20 DIAGNOSIS — Z5941 Food insecurity: Secondary | ICD-10-CM

## 2021-11-20 DIAGNOSIS — Z79899 Other long term (current) drug therapy: Secondary | ICD-10-CM | POA: Diagnosis not present

## 2021-11-20 DIAGNOSIS — Z599 Problem related to housing and economic circumstances, unspecified: Secondary | ICD-10-CM | POA: Diagnosis not present

## 2021-11-20 DIAGNOSIS — Z0000 Encounter for general adult medical examination without abnormal findings: Secondary | ICD-10-CM

## 2021-11-20 NOTE — Patient Instructions (Signed)
Ms. Klosinski , Thank you for taking time to come for your Medicare Wellness Visit. I appreciate your ongoing commitment to your health goals. Please review the following plan we discussed and let me know if I can assist you in the future.   Screening recommendations/referrals: Colonoscopy: Done 04/01/2021 - Repeat in 10 years  Mammogram: Done 07/26/2021 - Repeat annually Bone Density: Done 07/02/2016 - Repeat every 2 years *past due Recommended yearly ophthalmology/optometry visit for glaucoma screening and checkup Recommended yearly dental visit for hygiene and checkup  Vaccinations: Influenza vaccine: Done 05/28/2021 - Repeat annually  Pneumococcal vaccine: Done 10/06/2016 - due for Pneumovax Tdap vaccine: Done 02/04/2018 - Repeat in 10 years Shingles vaccine: Done 09/06/2018 - due for second dose  Covid-19: Done 02/19/2020, 03/18/2020, 10/08/2020, & 05/28/2021  Advanced directives: Please bring a copy of your health care power of attorney and living will to the office to be added to your chart at your convenience.   Conditions/risks identified: Aim for 30 minutes of exercise or brisk walking each day, drink 6-8 glasses of water and eat lots of fruits and vegetables.   Next appointment: Follow up in one year for your annual wellness visit.   Preventive Care 40-64 Years, Female Preventive care refers to lifestyle choices and visits with your health care provider that can promote health and wellness. What does preventive care include? A yearly physical exam. This is also called an annual well check. Dental exams once or twice a year. Routine eye exams. Ask your health care provider how often you should have your eyes checked. Personal lifestyle choices, including: Daily care of your teeth and gums. Regular physical activity. Eating a healthy diet. Avoiding tobacco and drug use. Limiting alcohol use. Practicing safe sex. Taking low-dose aspirin daily starting at age 41. Taking vitamin and  mineral supplements as recommended by your health care provider. What happens during an annual well check? The services and screenings done by your health care provider during your annual well check will depend on your age, overall health, lifestyle risk factors, and family history of disease. Counseling  Your health care provider may ask you questions about your: Alcohol use. Tobacco use. Drug use. Emotional well-being. Home and relationship well-being. Sexual activity. Eating habits. Work and work Statistician. Method of birth control. Menstrual cycle. Pregnancy history. Screening  You may have the following tests or measurements: Height, weight, and BMI. Blood pressure. Lipid and cholesterol levels. These may be checked every 5 years, or more frequently if you are over 28 years old. Skin check. Lung cancer screening. You may have this screening every year starting at age 51 if you have a 30-pack-year history of smoking and currently smoke or have quit within the past 15 years. Fecal occult blood test (FOBT) of the stool. You may have this test every year starting at age 78. Flexible sigmoidoscopy or colonoscopy. You may have a sigmoidoscopy every 5 years or a colonoscopy every 10 years starting at age 36. Hepatitis C blood test. Hepatitis B blood test. Sexually transmitted disease (STD) testing. Diabetes screening. This is done by checking your blood sugar (glucose) after you have not eaten for a while (fasting). You may have this done every 1-3 years. Mammogram. This may be done every 1-2 years. Talk to your health care provider about when you should start having regular mammograms. This may depend on whether you have a family history of breast cancer. BRCA-related cancer screening. This may be done if you have a family history of  breast, ovarian, tubal, or peritoneal cancers. Pelvic exam and Pap test. This may be done every 3 years starting at age 33. Starting at age 53, this may  be done every 5 years if you have a Pap test in combination with an HPV test. Bone density scan. This is done to screen for osteoporosis. You may have this scan if you are at high risk for osteoporosis. Discuss your test results, treatment options, and if necessary, the need for more tests with your health care provider. Vaccines  Your health care provider may recommend certain vaccines, such as: Influenza vaccine. This is recommended every year. Tetanus, diphtheria, and acellular pertussis (Tdap, Td) vaccine. You may need a Td booster every 10 years. Zoster vaccine. You may need this after age 26. Pneumococcal 13-valent conjugate (PCV13) vaccine. You may need this if you have certain conditions and were not previously vaccinated. Pneumococcal polysaccharide (PPSV23) vaccine. You may need one or two doses if you smoke cigarettes or if you have certain conditions. Talk to your health care provider about which screenings and vaccines you need and how often you need them. This information is not intended to replace advice given to you by your health care provider. Make sure you discuss any questions you have with your health care provider. Document Released: 11/22/2015 Document Revised: 07/15/2016 Document Reviewed: 08/27/2015 Elsevier Interactive Patient Education  2017 [employer] Prevention in the Home Falls can cause injuries. They can happen to people of all ages. There are many things you can do to make your home safe and to help prevent falls. What can I do on the outside of my home? Regularly fix the edges of walkways and driveways and fix any cracks. Remove anything that might make you trip as you walk through a door, such as a raised step or threshold. Trim any bushes or trees on the path to your home. Use bright outdoor lighting. Clear any walking paths of anything that might make someone trip, such as rocks or tools. Regularly check to see if handrails are loose or  broken. Make sure that both sides of any steps have handrails. Any raised decks and porches should have guardrails on the edges. Have any leaves, snow, or ice cleared regularly. Use sand or salt on walking paths during winter. Clean up any spills in your garage right away. This includes oil or grease spills. What can I do in the bathroom? Use night lights. Install grab bars by the toilet and in the tub and shower. Do not use towel bars as grab bars. Use non-skid mats or decals in the tub or shower. If you need to sit down in the shower, use a plastic, non-slip stool. Keep the floor dry. Clean up any water that spills on the floor as soon as it happens. Remove soap buildup in the tub or shower regularly. Attach bath mats securely with double-sided non-slip rug tape. Do not have throw rugs and other things on the floor that can make you trip. What can I do in the bedroom? Use night lights. Make sure that you have a light by your bed that is easy to reach. Do not use any sheets or blankets that are too big for your bed. They should not hang down onto the floor. Have a firm chair that has side arms. You can use this for support while you get dressed. Do not have throw rugs and other things on the floor that can make you trip. What  can I do in the kitchen? Clean up any spills right away. Avoid walking on wet floors. Keep items that you use a lot in easy-to-reach places. If you need to reach something above you, use a strong step stool that has a grab bar. Keep electrical cords out of the way. Do not use floor polish or wax that makes floors slippery. If you must use wax, use non-skid floor wax. Do not have throw rugs and other things on the floor that can make you trip. What can I do with my stairs? Do not leave any items on the stairs. Make sure that there are handrails on both sides of the stairs and use them. Fix handrails that are broken or loose. Make sure that handrails are as long as  the stairways. Check any carpeting to make sure that it is firmly attached to the stairs. Fix any carpet that is loose or worn. Avoid having throw rugs at the top or bottom of the stairs. If you do have throw rugs, attach them to the floor with carpet tape. Make sure that you have a light switch at the top of the stairs and the bottom of the stairs. If you do not have them, ask someone to add them for you. What else can I do to help prevent falls? Wear shoes that: Do not have high heels. Have rubber bottoms. Are comfortable and fit you well. Are closed at the toe. Do not wear sandals. If you use a stepladder: Make sure that it is fully opened. Do not climb a closed stepladder. Make sure that both sides of the stepladder are locked into place. Ask someone to hold it for you, if possible. Clearly mark and make sure that you can see: Any grab bars or handrails. First and last steps. Where the edge of each step is. Use tools that help you move around (mobility aids) if they are needed. These include: Canes. Walkers. Scooters. Crutches. Turn on the lights when you go into a dark area. Replace any light bulbs as soon as they burn out. Set up your furniture so you have a clear path. Avoid moving your furniture around. If any of your floors are uneven, fix them. If there are any pets around you, be aware of where they are. Review your medicines with your doctor. Some medicines can make you feel dizzy. This can increase your chance of falling. Ask your doctor what other things that you can do to help prevent falls. This information is not intended to replace advice given to you by your health care provider. Make sure you discuss any questions you have with your health care provider. Document Released: 08/22/2009 Document Revised: 04/02/2016 Document Reviewed: 11/30/2014 Elsevier Interactive Patient Education  2017 [employer].

## 2021-11-20 NOTE — Progress Notes (Addendum)
Subjective:   [name] is a 61 y.o. female who presents for Medicare Annual (Subsequent) preventive examination.  Virtual Visit via Telephone Note  I connected with  [name] on 11/20/21 at  3:30 PM EST by telephone and verified that I am speaking with the correct person using two identifiers.  Location: Patient: Home Provider: [facility] Persons participating in the virtual visit: patient/Nurse Health Advisor   I discussed the limitations, risks, security and privacy concerns of performing an evaluation and management service by telephone and the availability of in person appointments. The patient expressed understanding and agreed to proceed.  Interactive audio and video telecommunications were attempted between this nurse and patient, however failed, due to patient having technical difficulties OR patient did not have access to video capability.  We continued and completed visit with audio only.  Some vital signs may be absent or patient reported.   Azra Abrell E Euline Kimbler, LPN   Review of Systems     Cardiac Risk Factors include: sedentary lifestyle;obesity (BMI >30kg/m2);dyslipidemia;hypertension;Other (see comment), Risk factor comments: anemia     Objective:    Today's Vitals   11/20/21 1547 11/20/21 1554  Weight: 185 lb (83.9 kg)   Height: 5\' 4"  (1.626 m)   PainSc:  5    Body mass index is 31.76 kg/m.  Advanced Directives 11/20/2021 10/29/2021 10/27/2021 05/01/2021 04/28/2021 04/28/2021 04/25/2021  Does Patient Have a Medical Advance Directive? No No No No Yes Yes No  Type of Advance Directive - - - - Catering manager -  Does patient want to make changes to medical advance directive? - - - Yes (Inpatient - patient requests chaplain consult to change a medical advance directive) Yes (MAU/Ambulatory/Procedural Areas - Information given) No - Patient declined -  Copy of [place] in Chart? - - - - No - copy  requested No - copy requested -  Would patient like information on creating a medical advance directive? No - Patient declined No - Patient declined No - Patient declined Yes (Inpatient - patient requests chaplain consult to create a medical advance directive) No - Patient declined No - Patient declined No - Patient declined  Pre-existing out of facility DNR order (yellow form or pink MOST form) - - - - - - -    Current Medications (verified) Outpatient Encounter Medications as of 11/20/2021  Medication Sig   amitriptyline (ELAVIL) 25 MG tablet Take 25-50 mg by mouth See admin instructions. Take 50 mg by mouth at bedtime, may take 25-50 mg during the day if needed for pain   atenolol (TENORMIN) 50 MG tablet TAKE 1 TABLET BY MOUTH EVERY DAY   busPIRone (BUSPAR) 15 MG tablet TAKE 1 TABLET (15 MG TOTAL) BY MOUTH 3 (THREE) TIMES DAILY. TAKE 1 TABLET WITH THE 5MG  TO EQUAL 20MG .   busPIRone (BUSPAR) 5 MG tablet TAKE 1 TABLET BY MOUTH 3 TIMES DAILY. TAKE 1 TABLET WITH THE 15MG  TO EQUAL 20MG .   cetirizine (ZYRTEC) 10 MG tablet Take 10 mg by mouth daily.   citalopram (CELEXA) 40 MG tablet TAKE 1 TABLET BY MOUTH DAILY WITH BREAKFAST.   famotidine (PEPCID) 20 MG tablet Take 1 tablet (20 mg total) by mouth daily as needed for heartburn or indigestion.   hydrochlorothiazide (HYDRODIURIL) 25 MG tablet Take 1 tablet (25 mg total) by mouth daily.   latanoprost (XALATAN) 0.005 % ophthalmic solution Place 1 drop into both eyes at bedtime.   lidocaine (XYLOCAINE) 2 %  solution SMARTSIG:By Mouth   linaclotide (LINZESS) 290 MCG CAPS capsule Take 1 capsule (290 mcg total) by mouth daily before breakfast.   lisinopril (ZESTRIL) 10 MG tablet Take 0.5-1 tablets (5-10 mg total) by mouth daily. As directed   magic mouthwash SOLN Swish and swallow 50mL PO QID x5 days, then PRN- mouth pain. Steroid, Benadryl, Nystatin, Lidocaine 1:1 ratio.   ondansetron (ZOFRAN) 4 MG tablet Take 4 mg by mouth every 8 (eight) hours as needed  for nausea.   Oxycodone HCl 10 MG TABS Take 1 tablet (10 mg total) by mouth 2 (two) times daily. (Patient taking differently: Take 10 mg by mouth every 6 (six) hours.)   polyethylene glycol powder (MIRALAX) 17 GM/SCOOP powder Take 17 g by mouth in the morning and at bedtime.   predniSONE (STERAPRED UNI-PAK 21 TAB) 5 MG (21) TBPK tablet TAKE 6 TABLETS ON DAY 1 AS DIRECTED ON PACKAGE AND DECREASE BY 1 TAB EACH DAY FOR A TOTAL OF 6 DAYS   tiZANidine (ZANAFLEX) 4 MG tablet Take 1 tablet (4 mg total) by mouth every 8 (eight) hours as needed for muscle spasms.   traZODone (DESYREL) 100 MG tablet TAKE 1 AND 1/2 TABLET (150 MG TOTAL) BY MOUTH AT BEDTIME AS NEEDED FOR SLEEP.   nitroGLYCERIN (NITROSTAT) 0.4 MG SL tablet Place 1 tablet (0.4 mg total) under the tongue every 5 (five) minutes as needed for chest pain.   No facility-administered encounter medications on file as of 11/20/2021.    Allergies (verified) Nsaids   History: Past Medical History:  Diagnosis Date   Abdominal pain    Allergy    seasonal   Anemia 2021   Anxiety    Arthritis    Blood in stool    Blood transfusion without reported diagnosis 12/21/2020   Breast changes, fibrocystic    Bruises easily    Cardiomegaly 2018   Clotting disorder ([place])     itp , none since 1976, no current hematologist   Colon perforation due to stercoral ulcer with E. coli fecal peritonitis    Constipation    DDD (degenerative disc disease), cervical    with lumbar issues   Depression    E. coli sepsis -due to fecal peritonitis 12/23/2020   Generalized headaches    GERD (gastroesophageal reflux disease)    Glaucoma    Hemorrhoid    Hyperlipidemia    Hypertension    Idiopathic thrombocytopenia ([place])    as child   Leg swelling    both ankles   Liver lesion    Nasal congestion    Nausea & vomiting    Neuromuscular disorder (HCC)    back  injury   Neuromuscular disorder ([place])    3 neck surgeries,neck fusion.pin,plates,screws    Osteoporosis    Panic attacks    pt also  has germaphobia   Rectal bleeding    Seizures ([place]) 1998   stress induced, one time, none since, no seizure meds   Seizures ([place])    Stated she had 2 weeks ago,it was like a transient,stare.04/01/21 pt. denies   Spleen enlarged 1976, none since   r/t idiopathic thrombocytopenia   Stercoral ulcer of large intestine 12/21/2020   Trouble swallowing    Unintentional weight loss    Weakness    weakness varies   Past Surgical History:  Procedure Laterality Date   ABDOMINAL HYSTERECTOMY  1997   BACK SURGERY     CERVICAL SPINE SURGERY  2005, 2007, 2010   x3, fusion  done,plates and screws present   [place]  03/14/2012   Procedure: LAPAROSCOPIC CHOLECYSTECTOMY;  Surgeon: Stark Klein, MD;  Location: [facility];  Service: General;  Laterality: N/A;   COLECTOMY WITH COLOSTOMY CREATION/HARTMANN PROCEDURE N/A 12/21/2020   Procedure: COLECTOMY WITH COLOSTOMY CREATION/HARTMANN PROCEDURE;  Surgeon: Virl Cagey, MD;  Location: [facility];  Service: General;  Laterality: N/A;   COLON SURGERY     colonscopy and esophagogastrodudononescopy  02/04/2012   COLOSTOMY REVERSAL N/A 04/28/2021   Procedure: COLOSTOMY REVERSAL;  Surgeon: Virl Cagey, MD;  Location: [facility];  Service: General;  Laterality: N/A;   [place]  2022   INCISIONAL HERNIA REPAIR N/A 10/29/2021   Procedure: HERNIA REPAIR INCISIONAL; OPEN; WITH MESH;  Surgeon: Virl Cagey, MD;  Location: [facility];  Service: General;  Laterality: N/A;   IR RADIOLOGIST EVAL & MGMT  01/15/2021   LUMBAR [place] SURGERY  11/2014   SPINE SURGERY  2005, 2007, etc   6 surgeries   Family History  Problem Relation Age of Onset   Hypertension Mother    Heart disease Mother    Anxiety disorder Mother    Depression Mother    Lung cancer Father    Cancer Father        lung   Alcohol abuse Father    Diabetes Sister    Bipolar disorder Sister     Anxiety disorder Sister    Heart disease Sister        Congestive Heart Failure   Hypertension Sister    Depression Sister    Heart disease Maternal Grandmother    Colon cancer Paternal Grandmother    Cancer Paternal Grandmother        colon   Arthritis Paternal Uncle    Esophageal cancer Neg Hx    Rectal cancer Neg Hx    Stomach cancer Neg Hx    Breast cancer Neg Hx    Social History   Socioeconomic History   Marital status: Married    Spouse name: Legrand Como   Number of children: 3   Years of education: 14   Highest education level: Associate degree: academic program  Occupational History   Occupation: Corporate investment banker: UNEMPLOYED  Tobacco Use   Smoking status: Every Day    Packs/day: 1.00    Years: 10.00    Pack years: 10.00    Types: Cigarettes   Smokeless tobacco: Never   Tobacco comments:    Started 2007  Vaping Use   Vaping Use: Former   Quit date: 09/07/2016   Devices: only used for about a month  Substance and Sexual Activity   Alcohol use: No   Drug use: Yes    Types: Marijuana   Sexual activity: Not Currently  Other Topics Concern   Not on file  Social History Narrative   She lives with her boyfriend.  She used to smoke, quit May 2013, she is not drinking. She has 3 children   Social Determinants of Radio broadcast assistant Strain: High Risk   Difficulty of Paying Living Expenses: Hard  Food Insecurity: Food Insecurity Present   Worried About Charity fundraiser in the Last Year: Often true   Arboriculturist in the Last Year: Often true  Transportation Needs: No Transportation Needs   Lack of Transportation (Medical): No   Lack of Transportation (Non-Medical): No  Physical Activity: Insufficiently Active  Days of Exercise per Week: 3 days   Minutes of Exercise per Session: 30 min  Stress: Stress Concern Present   Feeling of Stress : To some extent  Social Connections: Socially Integrated   Frequency of Communication with Friends and  Family: Three times a week   Frequency of Social Gatherings with Friends and Family: Once a week   Attends Religious Services: More than 4 times per year   Active Member of [employer] or Organizations: Yes   Attends Music therapist: More than 4 times per year   Marital Status: Married    Tobacco Counseling Ready to quit: Not Answered Counseling given: Not Answered Tobacco comments: Started 2007   Clinical Intake:  Pre-visit preparation completed: Yes  Pain : 0-10 Pain Score: 5  Pain Type: Chronic pain Pain Location: Back Pain Orientation: Lower Pain Onset: More than a month ago Pain Frequency: Intermittent     BMI - recorded: 31.76 Nutritional Status: BMI > 30  Obese Diabetes: No  How often do you need to have someone help you when you read instructions, pamphlets, or other written materials from your doctor or pharmacy?: 1 - Never  Diabetic? no  Interpreter Needed?: No  Information entered by :: Lulia Schriner, LPN   Activities of Daily Living In your present state of health, do you have any difficulty performing the following activities: 11/20/2021 11/16/2021  Hearing? N N  Vision? N N  Difficulty concentrating or making decisions? N N  Walking or climbing stairs? Y Y  Comment hurts back -  Dressing or bathing? N N  Doing errands, shopping? Y Y  Comment having trouble driving herself -  Conservation officer, nature and eating ? N N  Using the Toilet? N N  In the past six months, have you accidently leaked urine? Tempie Donning  Comment wears depends -  Do you have problems with loss of bowel control? Y Y  Managing your Medications? Y N  Comment has trouble affording - she remembers to take it -  Managing your Finances? Y N  Housekeeping or managing your Housekeeping? Tempie Donning  Some recent data might be hidden    Patient Care Team: [name], DO as PCP - General (Family Medicine) Harl Bowie Alphonse Guild, MD as PCP - Cardiology (Cardiology) Starling Manns, MD (Orthopedic  Surgery) Harl Bowie Alphonse Guild, MD as Consulting Physician (Cardiology)  Indicate any recent Medical Services you may have received from other than [facility] providers in the past year (date may be approximate).     Assessment:   This is a routine wellness examination for Aleka.  Hearing/Vision screen Hearing Screening - Comments:: Denies hearing difficulties  Vision Screening - Comments:: Wears otc reading glasses prn only - up to date with annual eye exams with [facility]  Dietary issues and exercise activities discussed: Current Exercise Habits: Home exercise routine, Type of exercise: walking;stretching, Time (Minutes): 30, Frequency (Times/Week): 3, Weekly Exercise (Minutes/Week): 90, Intensity: Mild, Exercise limited by: orthopedic condition(s)   Goals Addressed             This Visit's Progress    DIET - INCREASE WATER INTAKE   On track    Try to drink 6-8 glasses of water daily     DIET - REDUCE SUGAR INTAKE   On track    Patient states she would like to 20 lbs.        Depression Screen PHQ 2/9 Scores 11/20/2021 09/08/2021 06/18/2021 05/20/2021 09/18/2020 09/06/2020 05/30/2020  PHQ - 2  Score 0 0 2 5 0 0 0  PHQ- 9 Score - 3 6 17  - - -    Fall Risk Fall Risk  11/20/2021 11/16/2021 09/08/2021 06/18/2021 05/20/2021  Falls in the past year? 0 0 0 0 0  Number falls in past yr: 0 0 - - -  Injury with Fall? 0 0 - - -  Risk for fall due to : Orthopedic patient;Medication side effect - - - -  Follow up Falls prevention discussed - - - -    FALL RISK PREVENTION PERTAINING TO THE HOME:  Any stairs in or around the home? No  If so, are there any without handrails? No  Home free of loose throw rugs in walkways, pet beds, electrical cords, etc? Yes  Adequate lighting in your home to reduce risk of falls? Yes   ASSISTIVE DEVICES UTILIZED TO PREVENT FALLS:  Life alert? No  Use of a cane, walker or w/c? No  Grab bars in the bathroom? Yes  Shower chair or bench in shower? Yes  Elevated  toilet seat or a handicapped toilet? Yes   TIMED UP AND GO:  Was the test performed? No . Telephonic visit  Cognitive Function: Normal cognitive status assessed by direct observation by this Nurse Health Advisor. No abnormalities found.    MMSE - Mini Mental State Exam 09/06/2018  Orientation to time 5  Orientation to Place 5  Registration 3  Attention/ Calculation 5  Recall 3  Language- name 2 objects 2  Language- repeat 1  Language- follow 3 step command 3  Language- read & follow direction 1  Write a sentence 1  Copy design 1  Total score 30     6CIT Screen 09/18/2020 09/08/2019  What Year? 0 points 0 points  What month? 0 points 0 points  What time? 0 points 0 points  Count back from 20 0 points 0 points  Months in reverse 0 points 0 points  Repeat phrase 0 points 2 points  Total Score 0 2    Immunizations Immunization History  Administered Date(s) Administered   Influenza,inj,Quad PF,6+ Mos 10/06/2016, 08/05/2017, 08/08/2018, 09/06/2020   Influenza-Unspecified 05/28/2021   Moderna Sars-Covid-2 Vaccination 02/19/2020, 03/18/2020, 10/08/2020, 05/28/2021   Pneumococcal Conjugate-13 10/06/2016   Tdap 02/04/2018   Zoster Recombinat (Shingrix) 09/06/2018    TDAP status: Up to date  Flu Vaccine status: Up to date  Pneumococcal vaccine status: Due, Education has been provided regarding the importance of this vaccine. Advised may receive this vaccine at local pharmacy or Health Dept. Aware to provide a copy of the vaccination record if obtained from local pharmacy or Health Dept. Verbalized acceptance and understanding.  Covid-19 vaccine status: Completed vaccines  Qualifies for Shingles Vaccine? Yes   Zostavax completed No   Shingrix Completed?: No.    Education has been provided regarding the importance of this vaccine. Patient has been advised to call insurance company to determine out of pocket expense if they have not yet received this vaccine. Advised may also  receive vaccine at local pharmacy or Health Dept. Verbalized acceptance and understanding.  Screening Tests Health Maintenance  Topic Date Due   DEXA SCAN  07/02/2018   PAP SMEAR-Modifier  07/03/2019   COVID-19 Vaccine (5 - Booster for Moderna series) 07/23/2021   Zoster Vaccines- Shingrix (2 of 2) 12/09/2021 (Originally 11/01/2018)   INFLUENZA VACCINE  02/06/2022 (Originally 06/09/2021)   Pneumococcal Vaccine 42-76 Years old (2 - PPSV23 if available, else PCV20) 05/20/2022 (Originally 10/06/2017)   MAMMOGRAM  07/10/2023   TETANUS/TDAP  02/05/2028   COLONOSCOPY (Pts 45-18yrs Insurance coverage will need to be confirmed)  04/02/2031   Hepatitis C Screening  Completed   HIV Screening  Completed   HPV VACCINES  Aged Out    Health Maintenance  Health Maintenance Due  Topic Date Due   DEXA SCAN  07/02/2018   PAP SMEAR-Modifier  07/03/2019   COVID-19 Vaccine (5 - Booster for Moderna series) 07/23/2021    Colorectal cancer screening: Type of screening: Colonoscopy. Completed 04/01/2021. Repeat every 10 years  Mammogram status: Completed 07/26/2021. Repeat every year  Bone Density status: Completed 07/02/2016. Results reflect: Bone density results: OSTEOPENIA. Repeat every 2 years.  Lung Cancer Screening: (Low Dose CT Chest recommended if Age 62-80 years, 30 pack-year currently smoking OR have quit w/in 15years.) does not qualify.  Additional Screening:  Hepatitis C Screening: does qualify; Completed 07/02/2016  Vision Screening: Recommended annual ophthalmology exams for early detection of glaucoma and other disorders of the eye. Is the patient up to date with their annual eye exam?  Yes  Who is the provider or what is the name of the office in which the patient attends annual eye exams? [facility] If pt is not established with a provider, would they like to be referred to a provider to establish care? No .   Dental Screening: Recommended annual dental exams for proper oral  hygiene  Community Resource Referral / Chronic Care Management: CRR required this visit?  Yes   CCM required this visit?  Yes      Plan:     I have personally reviewed and noted the following in the patients chart:   Medical and social history Use of alcohol, tobacco or illicit drugs  Current medications and supplements including opioid prescriptions.  Functional ability and status Nutritional status Physical activity Advanced directives List of other physicians Hospitalizations, surgeries, and ER visits in previous 12 months Vitals Screenings to include cognitive, depression, and falls Referrals and appointments  In addition, I have reviewed and discussed with patient certain preventive protocols, quality metrics, and best practice recommendations. A written personalized care plan for preventive services as well as general preventive health recommendations were provided to patient.     Sandrea Hammond, LPN   [id number]   Nurse Notes: CRR and CCM referrals to assist with medication costs, food costs, utility bill.   I have reviewed and agree with the above  documentation.   Evelina Dun, FNP

## 2021-11-26 ENCOUNTER — Other Ambulatory Visit: Payer: Medicare HMO

## 2021-11-26 ENCOUNTER — Telehealth: Payer: Self-pay

## 2021-11-26 NOTE — Chronic Care Management (AMB) (Signed)
Chronic Care Management   Note  11/26/2021 Name: NAKIMA FLUEGGE MRN: [id number] DOB: Nov 09, 1961  LORELEE MCLAURIN is a 61 y.o. year old female who is a primary care patient of [name], DO. I reached out to [employer] by phone today in response to a referral sent by Ms. Shelly Coss Florido's PCP.  Ms. Covell was given information about Chronic Care Management services today including:  CCM service includes personalized support from designated clinical staff supervised by her physician, including individualized plan of care and coordination with other care providers 24/7 contact phone numbers for assistance for urgent and routine care needs. Service will only be billed when office clinical staff spend 20 minutes or more in a month to coordinate care. Only one practitioner may furnish and bill the service in a calendar month. The patient may stop CCM services at any time (effective at the end of the month) by phone call to the office staff. The patient is responsible for co-pay (up to 20% after annual deductible is met) if co-pay is required by the individual health plan.   Patient agreed to services and verbal consent obtained.   Follow up plan: Telephone appointment with care management team member scheduled for:01/02/2022  Noreene Larsson, [place], [place], [place] [address] Direct Dial: [phone] [email or web address] Website: [email or web address]

## 2021-11-27 ENCOUNTER — Ambulatory Visit: Payer: Medicare HMO | Admitting: General Surgery

## 2021-11-28 ENCOUNTER — Telehealth: Payer: Self-pay

## 2021-11-28 NOTE — Telephone Encounter (Signed)
° °  Telephone encounter was:  Successful.  11/28/2021 Name: [name] MRN: [id number] DOB: [birth year]  [name] is a 61 y.o. year old female who is a primary care patient of [name], DO . The community resource team was consulted for assistance with Food Insecurity utility assistance and medication   Care guide performed the following interventions: Called Patient she seemed angry that I was calling with the referal and the phone got disconnected. I called back and left a message and it was sent to voicemail.  Follow Up Plan:  Care guide will follow up with patient by phone over the next few days    [facility]  [phone] [address], [place], [place] [address] Phone: [phone] Email: [email or web address]

## 2021-12-01 ENCOUNTER — Telehealth: Payer: Self-pay

## 2021-12-01 NOTE — Telephone Encounter (Signed)
° °  Telephone encounter was:  Successful.  12/01/2021 Name: [name] MRN: [id number] DOB: 25-Dec-1960  [name] is a 61 y.o. year old female who is a primary care patient of [name], DO . The community resource team was consulted for assistance with Food Insecurity and Financial Difficulties related to not having extra money to pay for food and bills  Care guide performed the following interventions: Patient provided with information about care guide support team and interviewed to confirm resource needs.Pt stated her husband has had a cut in hours and they cant afford food and utilities as well as some medicine. Pt asked if I could email resources to her asap  Follow Up Plan:  No further follow up planned at this time. The patient has been provided with needed resources.   [place], Care Management  [phone] [address], [place], [place] [address] Phone: [phone] Email: [email or web address]

## 2021-12-02 ENCOUNTER — Other Ambulatory Visit: Payer: Self-pay | Admitting: Family Medicine

## 2021-12-02 DIAGNOSIS — F411 Generalized anxiety disorder: Secondary | ICD-10-CM

## 2021-12-06 ENCOUNTER — Other Ambulatory Visit: Payer: Self-pay | Admitting: Family Medicine

## 2021-12-06 DIAGNOSIS — F331 Major depressive disorder, recurrent, moderate: Secondary | ICD-10-CM

## 2021-12-06 DIAGNOSIS — F411 Generalized anxiety disorder: Secondary | ICD-10-CM

## 2021-12-08 ENCOUNTER — Other Ambulatory Visit: Payer: Self-pay | Admitting: Family Medicine

## 2021-12-08 DIAGNOSIS — M79671 Pain in right foot: Secondary | ICD-10-CM

## 2021-12-08 DIAGNOSIS — M79672 Pain in left foot: Secondary | ICD-10-CM

## 2021-12-16 DIAGNOSIS — M4322 Fusion of spine, cervical region: Secondary | ICD-10-CM | POA: Diagnosis not present

## 2021-12-16 DIAGNOSIS — M4326 Fusion of spine, lumbar region: Secondary | ICD-10-CM | POA: Diagnosis not present

## 2021-12-16 DIAGNOSIS — M5416 Radiculopathy, lumbar region: Secondary | ICD-10-CM | POA: Diagnosis not present

## 2021-12-16 DIAGNOSIS — M542 Cervicalgia: Secondary | ICD-10-CM | POA: Diagnosis not present

## 2021-12-18 ENCOUNTER — Other Ambulatory Visit: Payer: Self-pay | Admitting: Family Medicine

## 2021-12-18 DIAGNOSIS — F411 Generalized anxiety disorder: Secondary | ICD-10-CM

## 2021-12-19 ENCOUNTER — Telehealth: Payer: Self-pay | Admitting: Cardiology

## 2021-12-19 NOTE — Telephone Encounter (Signed)
Pt c/o medication issue:  1. Name of Medication: hydralazine   2. How are you currently taking this medication (dosage and times per day)? Not currently taking   3. Are you having a reaction (difficulty breathing--STAT)? No   4. What is your medication issue? Patient is calling requesting a prescription of hydralazine sent to her pharmacy. She was unsure of the MG's, but states she took it as needed. Please advise.

## 2021-12-19 NOTE — Telephone Encounter (Signed)
Left message to return call 

## 2021-12-19 NOTE — Telephone Encounter (Signed)
Left a message for patient to call office back regarding medication refill.

## 2021-12-30 ENCOUNTER — Ambulatory Visit: Payer: Medicare HMO | Admitting: Podiatry

## 2022-01-02 ENCOUNTER — Telehealth: Payer: Medicare HMO

## 2022-01-02 ENCOUNTER — Telehealth: Payer: Self-pay | Admitting: Pharmacist

## 2022-01-02 NOTE — Telephone Encounter (Signed)
°  Care Management   Follow Up Note   01/02/2022 Name: [name] MRN: [id number] DOB: 11-28-1960   Referred by: [name], DO Reason for referral : Appointment (Unsuccessful outreach)   An unsuccessful telephone outreach was attempted today. The patient was referred to the case management team for assistance with care management and care coordination.   Follow Up Plan: The care management team will reach out to the patient again over the next 7 days to reschedule  SIGNATURE Regina Eck, PharmD, BCPS Clinical Pharmacist, [facility]  II Phone [phone]

## 2022-01-06 ENCOUNTER — Telehealth: Payer: Self-pay

## 2022-01-06 NOTE — Chronic Care Management (AMB) (Unsigned)
°  Care Management   Note  01/06/2022 Name: [name] MRN: [id number] DOB: 1961-06-01  [name] is a 62 y.o. year old female who is a primary care patient of [name], DO and is actively engaged with the care management team. I reached out to [name] by phone today to assist with re-scheduling an initial visit with the Pharmacist  Follow up plan: Unsuccessful telephone outreach attempt made. A HIPAA compliant phone message was left for the patient providing contact information and requesting a return call.  The care management team will reach out to the patient again over the next 5 days.  If patient returns call to provider office, please advise to call Omar  at [place], [place], [place], [place] [address] Direct Dial: [phone] [email or web address] Website: [email or web address]

## 2022-01-07 ENCOUNTER — Ambulatory Visit (INDEPENDENT_AMBULATORY_CARE_PROVIDER_SITE_OTHER): Payer: Medicare HMO | Admitting: Podiatry

## 2022-01-07 DIAGNOSIS — Z91199 Patient's noncompliance with other medical treatment and regimen due to unspecified reason: Secondary | ICD-10-CM

## 2022-01-07 DIAGNOSIS — M79672 Pain in left foot: Secondary | ICD-10-CM

## 2022-01-07 DIAGNOSIS — M79671 Pain in right foot: Secondary | ICD-10-CM

## 2022-01-07 NOTE — Progress Notes (Signed)
No show

## 2022-01-16 DIAGNOSIS — M5416 Radiculopathy, lumbar region: Secondary | ICD-10-CM | POA: Diagnosis not present

## 2022-01-16 DIAGNOSIS — G894 Chronic pain syndrome: Secondary | ICD-10-CM | POA: Diagnosis not present

## 2022-01-16 DIAGNOSIS — M4326 Fusion of spine, lumbar region: Secondary | ICD-10-CM | POA: Diagnosis not present

## 2022-01-16 DIAGNOSIS — M4322 Fusion of spine, cervical region: Secondary | ICD-10-CM | POA: Diagnosis not present

## 2022-01-22 ENCOUNTER — Telehealth: Payer: Self-pay

## 2022-01-22 NOTE — Chronic Care Management (AMB) (Signed)
?  Care Management  ? ?Note ? ?01/22/2022 ?Name: [name] MRN: [id number] DOB: [birth year] ? ?[name] is a 61 y.o. year old female who is a primary care patient of Betty Norlander, DO and is actively engaged with the care management team. I reached out to [name] by phone today to assist with re-scheduling an initial visit with the Pharmacist ? ?Follow up plan: ?Unsuccessful telephone outreach attempt made. A HIPAA compliant phone message was left for the patient providing contact information and requesting a return call.  ?The care management team will reach out to the patient again over the next 7 days.  ?If patient returns call to provider office, please advise to call [name] at [phone] ? ?[name], RMA ?Care Guide, Embedded Care Coordination ?[facility]  ?[place], [place] [address] ?Direct Dial: [phone] ?Museum/gallery [name] ?Website: [email or web address]  ? ?

## 2022-01-30 ENCOUNTER — Other Ambulatory Visit: Payer: Self-pay | Admitting: Family Medicine

## 2022-01-30 DIAGNOSIS — I10 Essential (primary) hypertension: Secondary | ICD-10-CM

## 2022-01-30 DIAGNOSIS — F411 Generalized anxiety disorder: Secondary | ICD-10-CM

## 2022-02-09 NOTE — Chronic Care Management (AMB) (Signed)
?  Care Management  ? ?Note ? ?02/09/2022 ?Name: [name] MRN: [id number] DOB: Mar 07, 1961 ? ?[name] is a 61 y.o. year old female who is a primary care patient of [name], DO and is actively engaged with the care management team. I reached out to [name] by phone today to assist with re-scheduling an initial visit with the Pharmacist ? ?Follow up plan: ?Unsuccessful telephone outreach attempt made. A HIPAA compliant phone message was left for the patient providing contact information and requesting a return call.  ?The care management team will reach out to the patient again over the next 7 days.  ?If patient returns call to provider office, please advise to call [place]  at [phone] ? ?Noreene Larsson, RMA ?Care Guide, Embedded Care Coordination ?[facility]  ?[place],  [address] ?Direct Dial: [phone] ?Museum/gallery [email or web address] ?Website: [email or web address]  ? ?

## 2022-02-10 NOTE — Chronic Care Management (AMB) (Signed)
?  Care Management  ? ?Note ? ?02/10/2022 ?Name: [name] MRN: [id number] DOB: 05/17/1961 ? ?[name] is a 61 y.o. year old female who is a primary care patient of [name], DO and is actively engaged with the care management team. I reached out to [name] by phone today to assist with re-scheduling an initial visit with the Pharmacist ? ?Follow up plan: ?Unable to make contact on outreach attempts x 3. PCP [name], DO notified via routed documentation in medical record.  ? ?Noreene Larsson, RMA ?Care Guide, Embedded Care Coordination ?[facility]  ?[place], [place] [address] ?Direct Dial: [phone] ?Museum/gallery [email or web address] ?Website: [email or web address]  ? ?

## 2022-02-11 DIAGNOSIS — M5416 Radiculopathy, lumbar region: Secondary | ICD-10-CM | POA: Diagnosis not present

## 2022-02-11 DIAGNOSIS — Z79899 Other long term (current) drug therapy: Secondary | ICD-10-CM | POA: Diagnosis not present

## 2022-02-11 DIAGNOSIS — M7918 Myalgia, other site: Secondary | ICD-10-CM | POA: Diagnosis not present

## 2022-02-11 DIAGNOSIS — G894 Chronic pain syndrome: Secondary | ICD-10-CM | POA: Diagnosis not present

## 2022-02-11 DIAGNOSIS — M4322 Fusion of spine, cervical region: Secondary | ICD-10-CM | POA: Diagnosis not present

## 2022-02-11 DIAGNOSIS — M545 Low back pain, unspecified: Secondary | ICD-10-CM | POA: Diagnosis not present

## 2022-02-11 DIAGNOSIS — Z79891 Long term (current) use of opiate analgesic: Secondary | ICD-10-CM | POA: Diagnosis not present

## 2022-02-22 ENCOUNTER — Other Ambulatory Visit: Payer: Self-pay | Admitting: Family Medicine

## 2022-02-22 DIAGNOSIS — F411 Generalized anxiety disorder: Secondary | ICD-10-CM

## 2022-02-22 DIAGNOSIS — F331 Major depressive disorder, recurrent, moderate: Secondary | ICD-10-CM

## 2022-03-09 DIAGNOSIS — M5416 Radiculopathy, lumbar region: Secondary | ICD-10-CM | POA: Diagnosis not present

## 2022-03-09 DIAGNOSIS — M7918 Myalgia, other site: Secondary | ICD-10-CM | POA: Diagnosis not present

## 2022-03-09 DIAGNOSIS — G894 Chronic pain syndrome: Secondary | ICD-10-CM | POA: Diagnosis not present

## 2022-03-09 DIAGNOSIS — M4322 Fusion of spine, cervical region: Secondary | ICD-10-CM | POA: Diagnosis not present

## 2022-03-16 ENCOUNTER — Other Ambulatory Visit: Payer: Self-pay | Admitting: Family Medicine

## 2022-03-16 DIAGNOSIS — F411 Generalized anxiety disorder: Secondary | ICD-10-CM

## 2022-04-07 ENCOUNTER — Ambulatory Visit: Payer: Medicare HMO | Admitting: General Surgery

## 2022-04-08 ENCOUNTER — Encounter: Payer: Self-pay | Admitting: Family Medicine

## 2022-04-08 ENCOUNTER — Other Ambulatory Visit: Payer: Self-pay | Admitting: Family Medicine

## 2022-04-08 DIAGNOSIS — I10 Essential (primary) hypertension: Secondary | ICD-10-CM

## 2022-04-08 DIAGNOSIS — K5909 Other constipation: Secondary | ICD-10-CM

## 2022-04-08 DIAGNOSIS — F5104 Psychophysiologic insomnia: Secondary | ICD-10-CM

## 2022-04-08 DIAGNOSIS — Z9889 Other specified postprocedural states: Secondary | ICD-10-CM

## 2022-04-08 DIAGNOSIS — F411 Generalized anxiety disorder: Secondary | ICD-10-CM

## 2022-04-14 DIAGNOSIS — G894 Chronic pain syndrome: Secondary | ICD-10-CM | POA: Diagnosis not present

## 2022-04-14 DIAGNOSIS — M5416 Radiculopathy, lumbar region: Secondary | ICD-10-CM | POA: Diagnosis not present

## 2022-04-14 DIAGNOSIS — M4322 Fusion of spine, cervical region: Secondary | ICD-10-CM | POA: Diagnosis not present

## 2022-04-16 ENCOUNTER — Other Ambulatory Visit: Payer: Self-pay | Admitting: Family Medicine

## 2022-04-16 DIAGNOSIS — M545 Low back pain, unspecified: Secondary | ICD-10-CM | POA: Diagnosis not present

## 2022-04-16 DIAGNOSIS — G894 Chronic pain syndrome: Secondary | ICD-10-CM | POA: Diagnosis not present

## 2022-04-16 DIAGNOSIS — M4322 Fusion of spine, cervical region: Secondary | ICD-10-CM | POA: Diagnosis not present

## 2022-04-16 DIAGNOSIS — M5416 Radiculopathy, lumbar region: Secondary | ICD-10-CM | POA: Diagnosis not present

## 2022-04-16 DIAGNOSIS — M7918 Myalgia, other site: Secondary | ICD-10-CM | POA: Diagnosis not present

## 2022-04-16 DIAGNOSIS — F411 Generalized anxiety disorder: Secondary | ICD-10-CM

## 2022-04-16 DIAGNOSIS — F331 Major depressive disorder, recurrent, moderate: Secondary | ICD-10-CM

## 2022-04-21 ENCOUNTER — Encounter: Payer: Self-pay | Admitting: General Surgery

## 2022-04-21 ENCOUNTER — Ambulatory Visit: Payer: Medicare HMO | Admitting: General Surgery

## 2022-04-21 VITALS — BP 140/87 | HR 68 | Temp 98.3°F | Resp 12 | Ht 64.0 in | Wt 188.0 lb

## 2022-04-21 DIAGNOSIS — R1012 Left upper quadrant pain: Secondary | ICD-10-CM

## 2022-04-21 NOTE — Patient Instructions (Addendum)
Keep taking the Linzess as needed for Bms. Will get CT to further investigate areas.  Will call with results.

## 2022-04-21 NOTE — Progress Notes (Signed)
[facility]  Patient had hernia repair in December but has been feeling more lower left abdominal pain that is sore and radiates to her back on the left. She also has some right rib pain at the abdomen/ chest flank. Still using linzess for her Bms.   BP 140/87   Pulse 68   Temp 98.3 F (36.8 C) (Oral)   Resp 12   Ht '5\' 4"'$  (1.626 m)   Wt 188 lb (85.3 kg)   SpO2 94%   BMI 32.27 kg/m  No midline or left sided hernia noted, some induration, tender over the left lower abdomen and right rib /flank area  Patient s/p hernia repair with mesh. Doing well but is having soreness in the left and is worried about recurrence. She also feels a pulling in the midline. Discussed that this could all be from muscle stretching and pain but we need to rule out recurrence.   Keep taking the Linzess as needed for Bms. Will get CT to further investigate areas.  Will call with results.   Future Appointments  Date Time Provider [place]  10/16/2022  9:00 AM McKenzie, Candee Furbish, MD [facility] None  11/24/2022  1:15 PM [facility]-ANNUAL WELLNESS VISIT [facility] None    [name], MD [facility] [address] [place], [place] [address] [phone] (office)

## 2022-04-22 ENCOUNTER — Encounter: Payer: Self-pay | Admitting: *Deleted

## 2022-04-22 NOTE — Progress Notes (Signed)
This encounter was created in error - please disregard.

## 2022-04-27 DIAGNOSIS — H5211 Myopia, right eye: Secondary | ICD-10-CM | POA: Diagnosis not present

## 2022-04-27 DIAGNOSIS — H25813 Combined forms of age-related cataract, bilateral: Secondary | ICD-10-CM | POA: Diagnosis not present

## 2022-04-27 DIAGNOSIS — H401131 Primary open-angle glaucoma, bilateral, mild stage: Secondary | ICD-10-CM | POA: Diagnosis not present

## 2022-04-28 ENCOUNTER — Encounter: Payer: Self-pay | Admitting: Family Medicine

## 2022-04-28 DIAGNOSIS — L02214 Cutaneous abscess of groin: Secondary | ICD-10-CM | POA: Diagnosis not present

## 2022-04-30 ENCOUNTER — Encounter (HOSPITAL_COMMUNITY): Payer: Self-pay

## 2022-04-30 ENCOUNTER — Ambulatory Visit (HOSPITAL_COMMUNITY)
Admission: RE | Admit: 2022-04-30 | Discharge: 2022-04-30 | Disposition: A | Discharge status: Home / Self Care | Payer: Medicare HMO | Source: Ambulatory Visit | Attending: General Surgery | Admitting: General Surgery

## 2022-04-30 DIAGNOSIS — R1032 Left lower quadrant pain: Secondary | ICD-10-CM | POA: Diagnosis not present

## 2022-04-30 DIAGNOSIS — R1012 Left upper quadrant pain: Secondary | ICD-10-CM | POA: Diagnosis not present

## 2022-04-30 DIAGNOSIS — K838 Other specified diseases of biliary tract: Secondary | ICD-10-CM | POA: Diagnosis not present

## 2022-04-30 MED ORDER — IOHEXOL 300 MG/ML  SOLN
100.0000 mL | Freq: Once | INTRAMUSCULAR | Status: AC | PRN
  Administered 2022-04-30: 100 mL via INTRAVENOUS

## 2022-04-30 MED ORDER — SODIUM CHLORIDE (PF) 0.9 % IJ SOLN
INTRAMUSCULAR | Status: AC
  Filled 2022-04-30: qty 50

## 2022-05-06 ENCOUNTER — Encounter: Payer: Self-pay | Admitting: Family Medicine

## 2022-05-06 ENCOUNTER — Ambulatory Visit (INDEPENDENT_AMBULATORY_CARE_PROVIDER_SITE_OTHER): Payer: Medicare HMO | Admitting: Family Medicine

## 2022-05-06 ENCOUNTER — Ambulatory Visit (INDEPENDENT_AMBULATORY_CARE_PROVIDER_SITE_OTHER): Payer: Medicare HMO

## 2022-05-06 VITALS — BP 154/91 | HR 84 | Temp 97.5°F | Resp 20 | Ht 64.0 in | Wt 186.0 lb

## 2022-05-06 DIAGNOSIS — I10 Essential (primary) hypertension: Secondary | ICD-10-CM

## 2022-05-06 DIAGNOSIS — D75839 Thrombocytosis, unspecified: Secondary | ICD-10-CM | POA: Diagnosis not present

## 2022-05-06 DIAGNOSIS — K5909 Other constipation: Secondary | ICD-10-CM

## 2022-05-06 DIAGNOSIS — R69 Illness, unspecified: Secondary | ICD-10-CM | POA: Diagnosis not present

## 2022-05-06 DIAGNOSIS — Z9889 Other specified postprocedural states: Secondary | ICD-10-CM | POA: Diagnosis not present

## 2022-05-06 DIAGNOSIS — F411 Generalized anxiety disorder: Secondary | ICD-10-CM

## 2022-05-06 DIAGNOSIS — Z780 Asymptomatic menopausal state: Secondary | ICD-10-CM | POA: Diagnosis not present

## 2022-05-06 DIAGNOSIS — F331 Major depressive disorder, recurrent, moderate: Secondary | ICD-10-CM

## 2022-05-06 DIAGNOSIS — F5104 Psychophysiologic insomnia: Secondary | ICD-10-CM

## 2022-05-06 MED ORDER — LISINOPRIL 10 MG PO TABS: 5.0000 mg | ORAL_TABLET | Freq: Every day | ORAL | 3 refills | Status: DC

## 2022-05-06 MED ORDER — CITALOPRAM HYDROBROMIDE 40 MG PO TABS: ORAL_TABLET | ORAL | 3 refills | Status: DC

## 2022-05-06 MED ORDER — LINACLOTIDE 290 MCG PO CAPS: 290.0000 ug | ORAL_CAPSULE | Freq: Every day | ORAL | 3 refills | Status: DC

## 2022-05-06 MED ORDER — ATENOLOL 50 MG PO TABS: 50.0000 mg | ORAL_TABLET | Freq: Every day | ORAL | 3 refills | Status: DC

## 2022-05-06 MED ORDER — TRAZODONE HCL 100 MG PO TABS: 100.0000 mg | ORAL_TABLET | Freq: Every evening | ORAL | 3 refills | Status: DC | PRN

## 2022-05-06 MED ORDER — BUSPIRONE HCL 5 MG PO TABS: ORAL_TABLET | ORAL | 3 refills | Status: DC

## 2022-05-06 MED ORDER — BUSPIRONE HCL 15 MG PO TABS: 15.0000 mg | ORAL_TABLET | Freq: Three times a day (TID) | ORAL | 3 refills | Status: DC

## 2022-05-06 MED ORDER — HYDROCHLOROTHIAZIDE 25 MG PO TABS
25.0000 mg | ORAL_TABLET | Freq: Every day | ORAL | 3 refills | Status: DC
Start: 2022-05-06 — End: 2023-05-05

## 2022-05-06 MED ORDER — FLUCONAZOLE 150 MG PO TABS: 150.0000 mg | ORAL_TABLET | Freq: Once | ORAL | 0 refills | Status: AC

## 2022-05-06 NOTE — Progress Notes (Signed)
Subjective: CC: Follow-up anxiety and depression PCP: [name], DO YYT:KPTWSF [name] is Betty 61 y.o. female presenting to clinic today for:  1.  Anxiety and depression Patient has lapsed in her Celexa and notes that her anxiety and depression have been not well controlled.  She is also not taking her full dose of BuSpar again due to lapse in prescription.  She has had some exacerbation of symptoms due to her mother's health.  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  Additionally mother was recently diagnosed with cognitive impairment and has yet to start medications for dementia.   ROS: Per HPI  Allergies  Allergen Reactions   Nsaids Nausea Only    Abdominal pain   Past Medical History:  Diagnosis Date   Abdominal pain    Abscess, intra-abdominal, postoperative 12/31/2020   Acute blood loss anemia ([facility]) 05/04/2021   Acute ITP ([place]) 05/08/2021   Allergy    seasonal   Anemia 2021   Anxiety    Arthritis    Aspiration pneumonia of both lower lobes due to gastric secretions ([place]) 12/22/2020   Blood in stool    Blood transfusion without reported diagnosis 12/21/2020   Breast changes, fibrocystic    Bruises easily    Cardiomegaly 2018   Clotting disorder ([place])     itp , none since 1976, no current hematologist   Colon perforation due to stercoral ulcer with E. coli fecal peritonitis    Constipation    DDD (degenerative disc disease), cervical    with lumbar issues   Depression    E. coli sepsis -due to fecal peritonitis 12/23/2020   Generalized headaches    GERD (gastroesophageal reflux disease)    Glaucoma    Hemorrhoid    Hyperlipidemia    Hypertension    Idiopathic thrombocytopenia ([place])    as child   Leg swelling    both ankles   Liver lesion    Nasal congestion    Nausea & vomiting    Neuromuscular disorder  (HCC)    back  injury   Neuromuscular disorder ([place])    3 neck surgeries,neck fusion.pin,plates,screws   Osteoporosis    Panic attacks    pt also  has germaphobia   Rectal bleeding    Seizures ([place]) 1998   stress induced, one time, none since, no seizure meds   Seizures ([place])    Stated she had 2 weeks ago,it was like Betty transient,stare.04/01/21 pt. denies   Spleen enlarged 1976, none since   r/t idiopathic thrombocytopenia   Stercoral ulcer of large intestine 12/21/2020   Trouble swallowing    Unintentional weight loss    Weakness    weakness varies    Current Outpatient Medications:    hydrALAZINE (APRESOLINE) 50 MG tablet, TAKE 1 TABLET BY MOUTH 2 TIMES DAILY AS NEEDED., Disp: , Rfl:    Oxycodone HCl 10 MG TABS, Take by mouth., Disp: , Rfl:    amitriptyline (ELAVIL) 25 MG tablet, Take 25-50 mg by mouth See admin instructions. Take 50 mg by mouth at bedtime, may take 25-50 mg during the day if needed for pain, Disp: , Rfl:    atenolol (TENORMIN) 50 MG tablet, Take 1 tablet (50 mg total) by mouth daily. (NEEDS TO BE SEEN BEFORE NEXT REFILL), Disp: 30 tablet, Rfl: 0   busPIRone (BUSPAR)  15 MG tablet, Take 1 tablet (15 mg total) by mouth 3 (three) times daily. (NEEDS TO BE SEEN BEFORE NEXT REFILL), Disp: 90 tablet, Rfl: 0   busPIRone (BUSPAR) 5 MG tablet, TAKE 1 TABLET BY MOUTH 3 TIMES DAILY. TAKE 1 TABLET WITH THE '15MG'$  TO EQUAL '20MG'$ ., Disp: 270 tablet, Rfl: 0   cetirizine (ZYRTEC) 10 MG tablet, Take 10 mg by mouth daily., Disp: , Rfl:    citalopram (CELEXA) 40 MG tablet, (NEEDS TO BE SEEN BEFORE NEXT REFILL), Disp: 30 tablet, Rfl: 0   clindamycin (CLEOCIN) 300 MG capsule, Take 300 mg by mouth 3 (three) times daily., Disp: , Rfl:    famotidine (PEPCID) 20 MG tablet, Take 1 tablet (20 mg total) by mouth daily as needed for heartburn or indigestion., Disp: 30 tablet, Rfl:    hydrochlorothiazide (HYDRODIURIL) 25 MG tablet, Take 1 tablet (25 mg total) by mouth daily., Disp: 90 tablet, Rfl:  3   latanoprost (XALATAN) 0.005 % ophthalmic solution, Place 1 drop into both eyes at bedtime., Disp: , Rfl:    lidocaine (XYLOCAINE) 2 % solution, SMARTSIG:By Mouth, Disp: , Rfl:    linaclotide (LINZESS) 290 MCG CAPS capsule, Take 1 capsule (290 mcg total) by mouth daily before breakfast. (NEEDS TO BE SEEN BEFORE NEXT REFILL), Disp: 30 capsule, Rfl: 0   lisinopril (ZESTRIL) 10 MG tablet, Take 0.5-1 tablets (5-10 mg total) by mouth daily. As directed, Disp: 90 tablet, Rfl: 3   magic mouthwash SOLN, Swish and swallow 51m PO QID x5 days, then PRN- mouth pain. Steroid, Benadryl, Nystatin, Lidocaine 1:1 ratio., Disp: 120 mL, Rfl: 0   nitroGLYCERIN (NITROSTAT) 0.4 MG SL tablet, Place 1 tablet (0.4 mg total) under the tongue every 5 (five) minutes as needed for chest pain., Disp: 25 tablet, Rfl: 3   ondansetron (ZOFRAN) 4 MG tablet, Take 4 mg by mouth every 8 (eight) hours as needed for nausea., Disp: , Rfl:    polyethylene glycol powder (MIRALAX) 17 GM/SCOOP powder, Take 17 g by mouth in the morning and at bedtime., Disp: 225 g, Rfl: PRN   tiZANidine (ZANAFLEX) 4 MG tablet, Take 1 tablet (4 mg total) by mouth every 8 (eight) hours as needed for muscle spasms., Disp: 30 tablet, Rfl: 1   traZODone (DESYREL) 100 MG tablet, Take 1 tablet (100 mg total) by mouth at bedtime as needed for sleep. 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  She used to smoke, quit May 2013, she is not drinking. She has 3 children  Social Determinants of Health   Financial Resource Strain: High Risk (12/01/2021)   Overall Financial Resource Strain (CARDIA)    Difficulty of Paying Living Expenses: Very hard  Food Insecurity: Food Insecurity Present (12/01/2021)   Hunger Vital Sign    Worried About Running Out of Food in the Last Year: Often true    Ran Out of Food in the Last Year: Sometimes true  Transportation Needs: No Transportation Needs (11/20/2021)   PRAPARE - Hydrologist (Medical): No    Lack of Transportation (Non-Medical): No  Physical Activity: Insufficiently Active (11/20/2021)   Exercise Vital Sign    Days of Exercise per Week: 3 days    Minutes of Exercise per Session: 30 min  Stress: Stress Concern Present (11/20/2021)   [place]    Feeling of Stress : To some extent  Social Connections: Socially Integrated (11/20/2021)   Social Connection and Isolation Panel [NHANES]    Frequency of Communication with Friends and Family: Three times Betty week    Frequency of Social Gatherings with Friends and Family: Once Betty week    Attends Religious Services: More than 4 times per year    Active Member of [employer] or Organizations: Yes    Attends Music therapist: More than 4 times per year    Marital Status: Married  Human resources officer Violence: Not At Risk (11/20/2021)   Humiliation, Afraid, Rape, and Kick questionnaire    Fear of Current or Ex-Partner: No    Emotionally Abused: No    Physically Abused: No    Sexually Abused: No   Family History  Problem Relation Age of Onset   Hypertension Mother    Heart  disease Mother    Anxiety disorder Mother    Depression Mother    Lung cancer Father    Cancer Father        lung   Alcohol abuse Father    Diabetes Sister    Bipolar disorder Sister    Anxiety disorder Sister    Heart disease Sister        Congestive Heart Failure   Hypertension Sister    Depression Sister    Heart disease Maternal Grandmother    Colon cancer Paternal Grandmother    Cancer Paternal Grandmother        colon   Arthritis Paternal Uncle    Esophageal cancer Neg Hx    Rectal cancer Neg Hx    Stomach cancer Neg Hx    Breast cancer Neg Hx     Objective: Office vital signs reviewed. BP (!) 154/91   Pulse 84   Temp (!) 97.5 F (36.4 C)   Resp 20   Ht '5\' 4"'$  (1.626 m)   Wt 186 lb (84.4 kg)   SpO2 98%   BMI 31.93 kg/m   Physical Examination:  General: Awake, alert, well nourished, No acute distress HEENT: Sclera slightly injected due to tears.  Moist mucous membranes Cardio: regular rate and rhythm, S1S2 heard, no murmurs appreciated Pulm: clear to auscultation bilaterally, no wheezes, rhonchi or rales; normal work of breathing on room air Psych: Depressed, tearful.  Appears stressed  Assessment/ Plan: 61 y.o. female   Thrombocytosis - Plan: CBC with Differential  Chronic constipation - Plan: linaclotide (LINZESS) 290 MCG CAPS capsule  Essential hypertension - Plan: atenolol (TENORMIN) 50 MG tablet, hydrochlorothiazide (HYDRODIURIL) 25 MG tablet, lisinopril (ZESTRIL) 10 MG tablet  GAD (generalized anxiety disorder) -  Plan: busPIRone (BUSPAR) 15 MG tablet, busPIRone (BUSPAR) 5 MG tablet, citalopram (CELEXA) 40 MG tablet  Moderate episode of recurrent major depressive disorder ([place]) - Plan: citalopram (CELEXA) 40 MG tablet  History of colostomy reversal - Plan: linaclotide (LINZESS) 290 MCG CAPS capsule  Psychophysiological insomnia - Plan: traZODone (DESYREL) 100 MG tablet  Asymptomatic postmenopausal estrogen deficiency - Plan: DG [facility] DEXA  Check  CBC with differential, particularly given ongoing need for antibiotics for what sounds like Betty staph positive abscess in her groin region  Linzess samples were provided and new Rx has been sent  Blood pressure not controlled but her anxiety and depression are quite uncontrolled secondary to lapse in medication and some situational exacerbations with her mother.  I have restarted her on Celexa and I would like to see her back in the next 6 weeks for recheck of mood and blood pressure.  DEXA scan ordered and collected today  No orders of the defined types were placed in this encounter.  No orders of the defined types were placed in this encounter.    [name], DO [place] [phone]

## 2022-05-07 LAB — CBC WITH DIFFERENTIAL/PLATELET
Basophils Absolute: 0.1 10*3/uL (ref 0.0–0.2)
Basos: 1 %
EOS (ABSOLUTE): 0.6 10*3/uL — ABNORMAL HIGH (ref 0.0–0.4)
Eos: 5 %
Hematocrit: 46.2 % (ref 34.0–46.6)
Hemoglobin: 15.7 g/dL (ref 11.1–15.9)
Immature Grans (Abs): 0.1 10*3/uL (ref 0.0–0.1)
Immature Granulocytes: 1 %
Lymphocytes Absolute: 3 10*3/uL (ref 0.7–3.1)
Lymphs: 29 %
MCH: 30 pg (ref 26.6–33.0)
MCHC: 34 g/dL (ref 31.5–35.7)
MCV: 88 fL (ref 79–97)
Monocytes Absolute: 0.9 10*3/uL (ref 0.1–0.9)
Monocytes: 9 %
Neutrophils Absolute: 5.8 10*3/uL (ref 1.4–7.0)
Neutrophils: 55 %
Platelets: 270 10*3/uL (ref 150–450)
RBC: 5.24 x10E6/uL (ref 3.77–5.28)
RDW: 13.6 % (ref 11.7–15.4)
WBC: 10.4 10*3/uL (ref 3.4–10.8)

## 2022-05-08 DIAGNOSIS — M85852 Other specified disorders of bone density and structure, left thigh: Secondary | ICD-10-CM | POA: Diagnosis not present

## 2022-05-08 DIAGNOSIS — M85851 Other specified disorders of bone density and structure, right thigh: Secondary | ICD-10-CM | POA: Diagnosis not present

## 2022-05-08 DIAGNOSIS — Z780 Asymptomatic menopausal state: Secondary | ICD-10-CM | POA: Diagnosis not present

## 2022-05-09 ENCOUNTER — Encounter: Payer: Self-pay | Admitting: Family Medicine

## 2022-05-14 DIAGNOSIS — M4322 Fusion of spine, cervical region: Secondary | ICD-10-CM | POA: Diagnosis not present

## 2022-05-14 DIAGNOSIS — G894 Chronic pain syndrome: Secondary | ICD-10-CM | POA: Diagnosis not present

## 2022-05-14 DIAGNOSIS — M7918 Myalgia, other site: Secondary | ICD-10-CM | POA: Diagnosis not present

## 2022-05-15 ENCOUNTER — Ambulatory Visit (INDEPENDENT_AMBULATORY_CARE_PROVIDER_SITE_OTHER): Payer: Medicare HMO

## 2022-05-15 ENCOUNTER — Ambulatory Visit (INDEPENDENT_AMBULATORY_CARE_PROVIDER_SITE_OTHER): Payer: Medicare HMO | Admitting: Podiatry

## 2022-05-15 DIAGNOSIS — M21612 Bunion of left foot: Secondary | ICD-10-CM

## 2022-05-15 DIAGNOSIS — M21622 Bunionette of left foot: Secondary | ICD-10-CM

## 2022-05-15 DIAGNOSIS — M2042 Other hammer toe(s) (acquired), left foot: Secondary | ICD-10-CM | POA: Diagnosis not present

## 2022-05-15 DIAGNOSIS — M21619 Bunion of unspecified foot: Secondary | ICD-10-CM

## 2022-05-15 DIAGNOSIS — M21611 Bunion of right foot: Secondary | ICD-10-CM

## 2022-05-15 DIAGNOSIS — L84 Corns and callosities: Secondary | ICD-10-CM

## 2022-05-16 NOTE — Progress Notes (Signed)
Subjective:   Patient ID: [name], female   DOB: 61 y.o.   MRN: [id number]   HPI Patient states she has severe pain underneath her left fifth metatarsal head and that its been very sore and making it hard for her to wear shoe gear comfortably.  Patient is tried to trim this and other modalities without relief along with shoe gear modifications and padding and also the left second digit has developed a corn underneath that that is also very tender and she has a structural deformity of the second toe and feels like it is being pushed into the ground.  She has generalized callus formation secondary to skin structure but these are the only 2 that hurt her and she does smoke a pack of cigarettes per day and tries to be active   Review of Systems  All other systems reviewed and are negative.       Objective:  Physical Exam Vitals and nursing note reviewed.  Constitutional:      Appearance: She is well-developed.  Pulmonary:     Effort: Pulmonary effort is normal.  Musculoskeletal:        General: Normal range of motion.  Skin:    General: Skin is warm.  Neurological:     Mental Status: She is alert.     Neurovascular status was found to be intact muscle strength was found to be adequate range of motion adequate.  Patient has severe discomfort underneath the fifth metatarsal head left with painful keratotic lesion formation with a abnormality of the plantar tissue and has keratotic lesion subsecond digit left with a rigid deformity of the proximal joint which appears to be jamming the distal joint into the ground creating lesion formation.  Has other lesions bilateral which are due to skin structure with family history of this and has good digital perfusion well oriented x3     Assessment:  Chronic tailor's bunion deformity left with painful fifth metatarsal its not done well conservatively along with rigid deformity of the second digit left with plantar keratotic lesion most  likely due to the structural position of the toe with just abnormal skin in general which is related to hereditary     Plan:  H&P reviewed all conditions and discussed.  She is interested in a more permanent solution and the best chance we have his fifth metatarsal head resection along with digital fusion digit to left and possible resection of plantar neoplasm left.  I spent a great deal of time going over this with her allowed her to read consent form going over alternative treatments complications and she understands there is absolutely no long-term guarantee is of surgery and that all complications risk are present.  She is willing to accept this and wants to have surgery all questions answered today understands recovery can take upwards of 6 months.  I went ahead today and I did dispense air fracture walker fitting it properly for her and I want her to get used to it prior to surgery and she can wear shoe on the other foot that balances her well.  X-rays indicate that there is pressure against the fifth metatarsal on the left and I did note a rigid deformity of the second digit left foot

## 2022-05-19 ENCOUNTER — Telehealth: Payer: Self-pay | Admitting: Urology

## 2022-05-19 NOTE — Telephone Encounter (Signed)
DOS - 05/26/22  METATARSAL HEAD RESECTION 5TH LEFT --- 20919 HAMMERTOE REPAIR 2ND LEFT --- 80221 EXC BENIGN LESION LEFT --- 11426  [employer] EFFECTIVE DATE - 11/09/21  PLAN DEDUCTIBLE - $0.00 OUT OF POCKET - $5,500.00 W/ $5,160.00 REMAINING COINSURANCE - 0% COPAY - $250.00   SPOKE WITH LOUIS A. WITH [employer] MEDICARE AND SHE STATED THAT FOR CPT CODES 79810, [phone] AND 28241 NO PRIOR AUTH IS REQUIRED.  REF # [id number]

## 2022-05-25 ENCOUNTER — Telehealth: Payer: Self-pay | Admitting: Family Medicine

## 2022-05-25 ENCOUNTER — Telehealth: Payer: Self-pay | Admitting: *Deleted

## 2022-05-25 MED ORDER — HYDROCODONE-ACETAMINOPHEN 10-325 MG PO TABS: 1.0000 | ORAL_TABLET | Freq: Three times a day (TID) | ORAL | 0 refills | Status: AC | PRN

## 2022-05-25 NOTE — Telephone Encounter (Signed)
Called and spoke w/ pharmacy to discontinue the hydrocodone sent,verbalized understanding.

## 2022-05-25 NOTE — Telephone Encounter (Signed)
Shouldn't need it

## 2022-05-25 NOTE — Telephone Encounter (Signed)
John w/ Aleatha Borer ([facility]) is calling to inform the physician that the patient is currently taking oxycodone-10 mg , 4 times daily on a regular basis from another physician. Does he want to continue to fill the recently prescribed Hydocodone-ace sent?  Please advise.

## 2022-05-25 NOTE — Addendum Note (Signed)
Addended by: Wallene Huh on: 05/25/2022 09:18 AM   Modules accepted: Orders

## 2022-05-25 NOTE — Telephone Encounter (Signed)
Aware appt cancelled

## 2022-05-26 ENCOUNTER — Encounter: Payer: Self-pay | Admitting: Podiatry

## 2022-05-26 ENCOUNTER — Ambulatory Visit: Payer: Medicare HMO | Admitting: Family Medicine

## 2022-05-26 DIAGNOSIS — D2372 Other benign neoplasm of skin of left lower limb, including hip: Secondary | ICD-10-CM

## 2022-05-26 DIAGNOSIS — M21622 Bunionette of left foot: Secondary | ICD-10-CM | POA: Diagnosis not present

## 2022-05-26 DIAGNOSIS — M2042 Other hammer toe(s) (acquired), left foot: Secondary | ICD-10-CM | POA: Diagnosis not present

## 2022-05-26 DIAGNOSIS — D492 Neoplasm of unspecified behavior of bone, soft tissue, and skin: Secondary | ICD-10-CM | POA: Diagnosis not present

## 2022-05-26 DIAGNOSIS — B079 Viral wart, unspecified: Secondary | ICD-10-CM | POA: Diagnosis not present

## 2022-05-26 DIAGNOSIS — M2012 Hallux valgus (acquired), left foot: Secondary | ICD-10-CM

## 2022-05-26 NOTE — Telephone Encounter (Signed)
Spoke with patient and she is aware that no other pain medicine will be prescribed and that she shouldn't need it.

## 2022-06-01 ENCOUNTER — Ambulatory Visit (INDEPENDENT_AMBULATORY_CARE_PROVIDER_SITE_OTHER): Payer: Medicare HMO

## 2022-06-01 ENCOUNTER — Ambulatory Visit (INDEPENDENT_AMBULATORY_CARE_PROVIDER_SITE_OTHER): Payer: Medicare HMO | Admitting: Podiatry

## 2022-06-01 DIAGNOSIS — M21622 Bunionette of left foot: Secondary | ICD-10-CM | POA: Diagnosis not present

## 2022-06-01 NOTE — Progress Notes (Signed)
Subjective:   Patient ID: [name], female   DOB: 61 y.o.   MRN: [id number]   HPI Patient states doing very well with surgery very happy with how it is doing so far   ROS      Objective:  Physical Exam  Neurovascular status intact negative Bevelyn Buckles' sign noted wound edges coapted well stitches intact pin in place second digit with good alignment noted     Assessment:  Doing well post digital fusion fifth metatarsal head resection left     Plan:  Sterile dressing reapplied continue holding the toe down surgical shoe dispensed continue boot wear shoe usage reappoint 2 weeks suture removal earlier if needed and I will see her back 4 weeks for pin removal.  Will need x-ray in 2 weeks

## 2022-06-15 ENCOUNTER — Ambulatory Visit (INDEPENDENT_AMBULATORY_CARE_PROVIDER_SITE_OTHER): Payer: Medicare HMO

## 2022-06-15 ENCOUNTER — Ambulatory Visit (INDEPENDENT_AMBULATORY_CARE_PROVIDER_SITE_OTHER): Payer: Medicare HMO | Admitting: Podiatry

## 2022-06-15 ENCOUNTER — Encounter: Payer: Self-pay | Admitting: Podiatry

## 2022-06-15 DIAGNOSIS — M2012 Hallux valgus (acquired), left foot: Secondary | ICD-10-CM | POA: Diagnosis not present

## 2022-06-15 DIAGNOSIS — Z9889 Other specified postprocedural states: Secondary | ICD-10-CM

## 2022-06-15 NOTE — Progress Notes (Signed)
Subjective:   Patient ID: [name], female   DOB: 61 y.o.   MRN: [id number]   HPI Patient presents stating that she is doing well with the pin intact second digit excellent alignment stitches intact wound edges coapted well   ROS      Objective:  Physical Exam  Doing well post foot surgery left with good alignment noted good resection fifth metatarsal head     Assessment:  Doing well post surgery left foot with wound edges well coapted stitches intact     Plan:  Stitches removed wound edges coapted well x-rays indicate pin is in excellent position holding the toe straight and the joint appears to be healing uneventfully and reappoint 2 weeks pin removal earlier if needed  X-rays do indicate excellent alignment of the second digit with pin holding the toe in a straight position

## 2022-06-16 DIAGNOSIS — M7918 Myalgia, other site: Secondary | ICD-10-CM | POA: Diagnosis not present

## 2022-06-16 DIAGNOSIS — G894 Chronic pain syndrome: Secondary | ICD-10-CM | POA: Diagnosis not present

## 2022-06-16 DIAGNOSIS — M4322 Fusion of spine, cervical region: Secondary | ICD-10-CM | POA: Diagnosis not present

## 2022-06-19 ENCOUNTER — Encounter: Payer: Self-pay | Admitting: Podiatry

## 2022-06-19 ENCOUNTER — Telehealth: Payer: Self-pay | Admitting: Podiatry

## 2022-06-19 ENCOUNTER — Ambulatory Visit (INDEPENDENT_AMBULATORY_CARE_PROVIDER_SITE_OTHER): Payer: Medicare HMO | Admitting: Podiatry

## 2022-06-19 ENCOUNTER — Other Ambulatory Visit: Payer: Self-pay | Admitting: Podiatry

## 2022-06-19 ENCOUNTER — Ambulatory Visit (INDEPENDENT_AMBULATORY_CARE_PROVIDER_SITE_OTHER): Payer: Medicare HMO

## 2022-06-19 DIAGNOSIS — Z9889 Other specified postprocedural states: Secondary | ICD-10-CM

## 2022-06-19 DIAGNOSIS — M21622 Bunionette of left foot: Secondary | ICD-10-CM

## 2022-06-19 DIAGNOSIS — M2042 Other hammer toe(s) (acquired), left foot: Secondary | ICD-10-CM

## 2022-06-19 DIAGNOSIS — M79672 Pain in left foot: Secondary | ICD-10-CM | POA: Diagnosis not present

## 2022-06-19 DIAGNOSIS — Z09 Encounter for follow-up examination after completed treatment for conditions other than malignant neoplasm: Secondary | ICD-10-CM

## 2022-06-19 DIAGNOSIS — Z89422 Acquired absence of other left toe(s): Secondary | ICD-10-CM | POA: Diagnosis not present

## 2022-06-19 MED ORDER — DOXYCYCLINE HYCLATE 100 MG PO TABS: 100.0000 mg | ORAL_TABLET | Freq: Two times a day (BID) | ORAL | 0 refills | Status: AC

## 2022-06-19 NOTE — Progress Notes (Addendum)
Subjective:  Patient ID: [name], female    DOB: 1961/08/05,  MRN: [id number]  Chief Complaint  Patient presents with   Post-op Problem    having severe pain in toe and it is turning black,cannot put weight on that foot    DOS: 05/26/22 Procedure: Left second hammertoe repair, fifth met head resection and excision of lesion with Dr. Paulla Dolly   61 y.o. female returns for POV#3. Patient coming in urgently for concern of darkening of toe and severe pain in the foot that started last night. Relates she recently had a MRSA infection somewhere else and was concerned for infection. Relates the pain is improved currently. States she was up on her feet a lot the previous day cooking.   Review of Systems: Negative except as noted in the HPI. Denies N/V/F/Ch.  Past Medical History:  Diagnosis Date   Abdominal pain    Abscess, intra-abdominal, postoperative 12/31/2020   Acute blood loss anemia (ABLA) 05/04/2021   Acute ITP ([place]) 05/08/2021   Allergy    seasonal   Anemia 2021   Anxiety    Arthritis    Aspiration pneumonia of both lower lobes due to gastric secretions ([place]) 12/22/2020   Blood in stool    Blood transfusion without reported diagnosis 12/21/2020   Breast changes, fibrocystic    Bruises easily    Cardiomegaly 2018   Clotting disorder ([place])     itp , none since 1976, no current hematologist   Colon perforation due to stercoral ulcer with E. coli fecal peritonitis    Constipation    DDD (degenerative disc disease), cervical    with lumbar issues   Depression    E. coli sepsis -due to fecal peritonitis 12/23/2020   Generalized headaches    GERD (gastroesophageal reflux disease)    Glaucoma    Hemorrhoid    Hyperlipidemia    Hypertension    Idiopathic thrombocytopenia ([place])    as child   Leg swelling    both ankles   Liver lesion    Nasal congestion    Nausea & vomiting    Neuromuscular disorder ([facility])    back  injury   Neuromuscular disorder ([place])    3 neck  surgeries,neck fusion.pin,plates,screws   Osteoporosis    Panic attacks    pt also  has germaphobia   Rectal bleeding    Seizures ([place]) 1998   stress induced, one time, none since, no seizure meds   Seizures ([place])    Stated she had 2 weeks ago,it was like a transient,stare.04/01/21 pt. denies   Spleen enlarged 1976, none since   r/t idiopathic thrombocytopenia   Stercoral ulcer of large intestine 12/21/2020   Trouble swallowing    Unintentional weight loss    Weakness    weakness varies    Current Outpatient Medications:    amitriptyline (ELAVIL) 25 MG tablet, Take 25-50 mg by mouth See admin instructions. Take 50 mg by mouth at bedtime, may take 25-50 mg during the day if needed for pain, Disp: , Rfl:    atenolol (TENORMIN) 50 MG tablet, Take 1 tablet (50 mg total) by mouth daily., Disp: 90 tablet, Rfl: 3   busPIRone (BUSPAR) 15 MG tablet, Take 1 tablet (15 mg total) by mouth 3 (three) times daily., Disp: 90 tablet, Rfl: 3   busPIRone (BUSPAR) 5 MG tablet, TAKE 1 TABLET BY MOUTH 3 TIMES DAILY. TAKE 1 TABLET WITH THE 15MG TO EQUAL 20MG., Disp: 270 tablet, Rfl: 3   cetirizine (  ZYRTEC) 10 MG tablet, Take 10 mg by mouth daily., Disp: , Rfl:    citalopram (CELEXA) 40 MG tablet, Take 1 tablet daily for mood, Disp: 90 tablet, Rfl: 3   clindamycin (CLEOCIN) 300 MG capsule, Take 300 mg by mouth 3 (three) times daily., Disp: , Rfl:    famotidine (PEPCID) 20 MG tablet, Take 1 tablet (20 mg total) by mouth daily as needed for heartburn or indigestion., Disp: 30 tablet, Rfl:    hydrochlorothiazide (HYDRODIURIL) 25 MG tablet, Take 1 tablet (25 mg total) by mouth daily., Disp: 90 tablet, Rfl: 3   latanoprost (XALATAN) 0.005 % ophthalmic solution, Place 1 drop into both eyes at bedtime., Disp: , Rfl:    linaclotide (LINZESS) 290 MCG CAPS capsule, Take 1 capsule (290 mcg total) by mouth daily before breakfast., Disp: 90 capsule, Rfl: 3   lisinopril (ZESTRIL) 10 MG tablet, Take 0.5-1 tablets (5-10 mg  total) by mouth daily. As directed, Disp: 90 tablet, Rfl: 3   nitroGLYCERIN (NITROSTAT) 0.4 MG SL tablet, Place 1 tablet (0.4 mg total) under the tongue every 5 (five) minutes as needed for chest pain. (Patient not taking: Reported on 05/06/2022), Disp: 25 tablet, Rfl: 3   ondansetron (ZOFRAN) 4 MG tablet, Take 4 mg by mouth every 8 (eight) hours as needed for nausea., Disp: , Rfl:    Oxycodone HCl 10 MG TABS, Take by mouth., Disp: , Rfl:    polyethylene glycol powder (MIRALAX) 17 GM/SCOOP powder, Take 17 g by mouth in the morning and at bedtime., Disp: 225 g, Rfl: PRN   tiZANidine (ZANAFLEX) 4 MG tablet, Take 1 tablet (4 mg total) by mouth every 8 (eight) hours as needed for muscle spasms., Disp: 30 tablet, Rfl: 1   traZODone (DESYREL) 100 MG tablet, Take 1 tablet (100 mg total) by mouth at bedtime as needed for sleep., Disp: 90 tablet, Rfl: 3  Social History   Tobacco Use  Smoking Status Every Day   Packs/day: 1.00   Years: 10.00   Total pack years: 10.00   Types: Cigarettes  Smokeless Tobacco Never  Tobacco Comments   Started 2007    Allergies  Allergen Reactions   Nsaids Nausea Only    Abdominal pain   Objective:  There were no vitals filed for this visit. There is no height or weight on file to calculate BMI. Constitutional Well developed. Well nourished.  Vascular Foot warm and well perfused. Capillary refill normal to all digits.   Neurologic Normal speech. Oriented to person, place, and time. Epicritic sensation to light touch grossly present bilaterally.  Dermatologic Skin healing well without signs of infection. Skin edges well coapted without signs of infection. No erythema. Mild edema noted to the second digit but no major concerns for infection. Pin still intact.   Orthopedic: Tenderness to palpation noted about the surgical site.   Radiographs: Hardware intact and toe well aligned. Pin does appear to have been pushed in further into MPJ on second digit.   Assessment:   1. Post-operative state   2. Tailor's bunionette, left   3. Hammer toe of left foot    Plan:  Patient was evaluated and treated and all questions answered.  S/p foot surgery left -Progressing as expected post-operatively. -WB Status: WBAT in surgical shoe  - Prescription for Doxycycline provided as precaution.  -Foot redressed. Follow-up as scheduled for pin removal.   Return if symptoms worsen or fail to improve.

## 2022-06-19 NOTE — Telephone Encounter (Signed)
Patient called and stated that her sx foot is swollen 3x times the size and its dark.   Patient would like someone to give her a call.

## 2022-06-24 NOTE — Telephone Encounter (Signed)
You can have her come in tomorrow if it has not come down

## 2022-06-26 ENCOUNTER — Other Ambulatory Visit: Payer: Self-pay | Admitting: Family Medicine

## 2022-06-26 DIAGNOSIS — F411 Generalized anxiety disorder: Secondary | ICD-10-CM

## 2022-06-29 ENCOUNTER — Ambulatory Visit (INDEPENDENT_AMBULATORY_CARE_PROVIDER_SITE_OTHER): Payer: Medicare HMO

## 2022-06-29 ENCOUNTER — Encounter: Payer: Self-pay | Admitting: Podiatry

## 2022-06-29 ENCOUNTER — Ambulatory Visit (INDEPENDENT_AMBULATORY_CARE_PROVIDER_SITE_OTHER): Payer: Medicare HMO | Admitting: Podiatry

## 2022-06-29 DIAGNOSIS — Z9889 Other specified postprocedural states: Secondary | ICD-10-CM

## 2022-06-29 DIAGNOSIS — M21621 Bunionette of right foot: Secondary | ICD-10-CM | POA: Diagnosis not present

## 2022-06-29 DIAGNOSIS — M2021 Hallux rigidus, right foot: Secondary | ICD-10-CM

## 2022-06-30 ENCOUNTER — Telehealth: Payer: Self-pay

## 2022-06-30 NOTE — Telephone Encounter (Signed)
DOS 07/07/2022  [facility] - [address] METATARSAL HEAD RESC 5TH RT - [place]  SPOKE TO NICK AT [place], HE STATED NO PRECERT REQUIRED FOR CPT [phone] [place] 16606. CALL REF # [id number]

## 2022-07-01 NOTE — Progress Notes (Signed)
Patient presents stating subjective:   Patient ID: [name], female   DOB: 61 y.o.   MRN: [id number]   HPI Doing well on the left foot ready to get the right foot fixed.  Wants to review correction of the right foot today would like to get it done in the next couple weeks   ROS      Objective:  Physical Exam  Vascular status intact pin intact second digit left good alignment noted and good reduction of discomfort plantar aspect left fifth metatarsal.  Right foot shows diminished range of motion first MPJ with spurring around the first metatarsal head and prominent fifth metatarsal head right with pain     Assessment:  Doing well left foot with good structural correction with hallux limitus deformity right tailor's bunion deformity right     Plan:  H&P reviewed conditions and for the left x-rays pin removal sterile dressing gradual return to soft shoe and for the right foot discussed hallux limitus repair with the possibility long-term and could require fusion or implantation and fifth metatarsal head resection right.  I let patient read consent form for the right going over all possible complications and the fact there is no long-term guarantees patient is willing to have it done after extensive review signed consent form scheduled for outpatient surgery right foot  X-rays left indicate satisfactory resection of bone good alignment second digit

## 2022-07-06 MED ORDER — OXYCODONE-ACETAMINOPHEN 10-325 MG PO TABS: 1.0000 | ORAL_TABLET | ORAL | 0 refills | Status: DC | PRN

## 2022-07-06 NOTE — Addendum Note (Signed)
Addended by: Wallene Huh on: 07/06/2022 05:06 PM   Modules accepted: Orders

## 2022-07-07 ENCOUNTER — Other Ambulatory Visit: Payer: Self-pay

## 2022-07-07 ENCOUNTER — Emergency Department (HOSPITAL_COMMUNITY): Payer: Medicare HMO

## 2022-07-07 ENCOUNTER — Emergency Department (HOSPITAL_COMMUNITY)
Admission: EM | Admit: 2022-07-07 | Discharge: 2022-07-07 | Disposition: A | Discharge status: Home / Self Care | Payer: Medicare HMO | Attending: Emergency Medicine | Admitting: Emergency Medicine

## 2022-07-07 ENCOUNTER — Telehealth: Payer: Self-pay

## 2022-07-07 ENCOUNTER — Encounter (HOSPITAL_COMMUNITY): Payer: Self-pay | Admitting: *Deleted

## 2022-07-07 DIAGNOSIS — R0689 Other abnormalities of breathing: Secondary | ICD-10-CM | POA: Diagnosis not present

## 2022-07-07 DIAGNOSIS — D72829 Elevated white blood cell count, unspecified: Secondary | ICD-10-CM | POA: Diagnosis not present

## 2022-07-07 DIAGNOSIS — G4489 Other headache syndrome: Secondary | ICD-10-CM | POA: Diagnosis not present

## 2022-07-07 DIAGNOSIS — J321 Chronic frontal sinusitis: Secondary | ICD-10-CM | POA: Diagnosis not present

## 2022-07-07 DIAGNOSIS — Z79899 Other long term (current) drug therapy: Secondary | ICD-10-CM | POA: Insufficient documentation

## 2022-07-07 DIAGNOSIS — R42 Dizziness and giddiness: Secondary | ICD-10-CM | POA: Diagnosis not present

## 2022-07-07 DIAGNOSIS — Z743 Need for continuous supervision: Secondary | ICD-10-CM | POA: Diagnosis not present

## 2022-07-07 DIAGNOSIS — I10 Essential (primary) hypertension: Secondary | ICD-10-CM | POA: Diagnosis not present

## 2022-07-07 DIAGNOSIS — G441 Vascular headache, not elsewhere classified: Secondary | ICD-10-CM | POA: Insufficient documentation

## 2022-07-07 DIAGNOSIS — J320 Chronic maxillary sinusitis: Secondary | ICD-10-CM | POA: Insufficient documentation

## 2022-07-07 DIAGNOSIS — R110 Nausea: Secondary | ICD-10-CM | POA: Diagnosis not present

## 2022-07-07 DIAGNOSIS — R519 Headache, unspecified: Secondary | ICD-10-CM | POA: Diagnosis present

## 2022-07-07 LAB — CBC WITH DIFFERENTIAL/PLATELET
Abs Immature Granulocytes: 0.09 10*3/uL — ABNORMAL HIGH (ref 0.00–0.07)
Basophils Absolute: 0.1 10*3/uL (ref 0.0–0.1)
Basophils Relative: 1 %
Eosinophils Absolute: 0.2 10*3/uL (ref 0.0–0.5)
Eosinophils Relative: 2 %
HCT: 47.5 % — ABNORMAL HIGH (ref 36.0–46.0)
Hemoglobin: 16 g/dL — ABNORMAL HIGH (ref 12.0–15.0)
Immature Granulocytes: 1 %
Lymphocytes Relative: 18 %
Lymphs Abs: 2.2 10*3/uL (ref 0.7–4.0)
MCH: 30 pg (ref 26.0–34.0)
MCHC: 33.7 g/dL (ref 30.0–36.0)
MCV: 89.1 fL (ref 80.0–100.0)
Monocytes Absolute: 0.6 10*3/uL (ref 0.1–1.0)
Monocytes Relative: 5 %
Neutro Abs: 8.7 10*3/uL — ABNORMAL HIGH (ref 1.7–7.7)
Neutrophils Relative %: 73 %
Platelets: 235 10*3/uL (ref 150–400)
RBC: 5.33 MIL/uL — ABNORMAL HIGH (ref 3.87–5.11)
RDW: 12.9 % (ref 11.5–15.5)
WBC: 11.9 10*3/uL — ABNORMAL HIGH (ref 4.0–10.5)
nRBC: 0 % (ref 0.0–0.2)

## 2022-07-07 LAB — COMPREHENSIVE METABOLIC PANEL
ALT: 30 U/L (ref 0–44)
AST: 20 U/L (ref 15–41)
Albumin: 3.7 g/dL (ref 3.5–5.0)
Alkaline Phosphatase: 138 U/L — ABNORMAL HIGH (ref 38–126)
Anion gap: 9 (ref 5–15)
BUN: 12 mg/dL (ref 8–23)
CO2: 26 mmol/L (ref 22–32)
Calcium: 9.3 mg/dL (ref 8.9–10.3)
Chloride: 102 mmol/L (ref 98–111)
Creatinine, Ser: 0.65 mg/dL (ref 0.44–1.00)
GFR, Estimated: >60 mL/min (ref >60)
Glucose, Bld: 104 mg/dL — ABNORMAL HIGH (ref 70–99)
Potassium: 3.8 mmol/L (ref 3.5–5.1)
Sodium: 137 mmol/L (ref 135–145)
Total Bilirubin: 0.6 mg/dL (ref 0.3–1.2)
Total Protein: 7.2 g/dL (ref 6.5–8.1)

## 2022-07-07 MED ORDER — OXYCODONE-ACETAMINOPHEN 5-325 MG PO TABS
1.0000 | ORAL_TABLET | Freq: Once | ORAL | Status: AC
  Administered 2022-07-07: 1 via ORAL
  Filled 2022-07-07: qty 1

## 2022-07-07 MED ORDER — HYDROMORPHONE HCL 1 MG/ML IJ SOLN
0.5000 mg | Freq: Once | INTRAMUSCULAR | Status: AC
  Administered 2022-07-07: 0.5 mg via INTRAVENOUS
  Filled 2022-07-07: qty 0.5

## 2022-07-07 MED ORDER — AMOXICILLIN 500 MG PO CAPS: 500.0000 mg | ORAL_CAPSULE | Freq: Three times a day (TID) | ORAL | 0 refills | Status: DC

## 2022-07-07 NOTE — Discharge Instructions (Signed)
Follow-up with your family doctor this week for recheck on your blood pressure

## 2022-07-07 NOTE — ED Triage Notes (Signed)
Pt brought in by ems for c/o headache with nausea; pt has not been taking her HCTZ  170/95 and 180/80 with ems  Cbg 121  Pt given zofran '4mg'$  IV en route by ems

## 2022-07-07 NOTE — ED Notes (Signed)
Pt given cup of ice per request  

## 2022-07-07 NOTE — Telephone Encounter (Signed)
[name] called and stated she left a message at [facility] stating she needed to cancel because of her blood pressure. She is currently at [facility] to be treated. She stated she will call me back to reschedule her surgery.

## 2022-07-07 NOTE — Telephone Encounter (Signed)
[name] was scheduled for surgery with Dr. Paulla Dolly on 07/07/2022 but did not show per Caren Griffins at [facility]. I left her a message to call me so we can reschedule her surgery.

## 2022-07-08 ENCOUNTER — Telehealth: Payer: Self-pay | Admitting: Cardiology

## 2022-07-08 NOTE — Telephone Encounter (Signed)
Patient was seen at [facility] ED yesterday for same complaints and discharged. She admits to not following up with care. Her mother is ill and has admitted to not caring for herself. She ran out of Hydralazine 25 mg TID for BP over 170/100 some time ago. She takes atenolol 50 mg qd and lisinopril 10 mg qd. She takes HCTZ 25 mg qd only about half the time as it makes her urinate and she is busy transporting her mother.  She has no c/o CP at this time.   Current BP 164/111, HR 88 per home machine.  She is willing to go to any location for follow up. Appointment made for 07/14/22 at 7:45 am [facility] with Hulda Humphrey    I will message Dr.Branch for advice.

## 2022-07-08 NOTE — ED Provider Notes (Signed)
[facility] Provider Note   CSN: [id number] Arrival date & time: 07/07/22  1522     History  Chief Complaint  Patient presents with   Headache    [name] is a 61 y.o. female.  Patient complains of a headache, she has history of hypertension and states has not been controlled.  She had to take an extra Apresoline today  The history is provided by the patient and medical records. No language interpreter was used.  Headache Pain location:  Generalized Quality:  Dull Radiates to:  Does not radiate Severity currently:  4/10 Severity at highest:  9/10 Onset quality:  Sudden Timing:  Constant Progression:  Waxing and waning Chronicity:  Recurrent Similar to prior headaches: no   Context: not activity   Associated symptoms: no abdominal pain, no back pain, no congestion, no cough, no diarrhea, no fatigue, no seizures and no sinus pressure        Home Medications Prior to Admission medications   Medication Sig Start Date End Date Taking? Authorizing Provider  amoxicillin (AMOXIL) 500 MG capsule Take 1 capsule (500 mg total) by mouth 3 (three) times daily. 07/07/22  Yes Milton Ferguson, MD  amitriptyline (ELAVIL) 25 MG tablet Take 25-50 mg by mouth See admin instructions. Take 50 mg by mouth at bedtime, may take 25-50 mg during the day if needed for pain 08/04/19   [provider]  atenolol (TENORMIN) 50 MG tablet Take 1 tablet (50 mg total) by mouth daily. 05/06/22   [name], DO  busPIRone (BUSPAR) 15 MG tablet TAKE 1 TABLET BY MOUTH 3 TIMES DAILY. 06/26/22   [name], DO  busPIRone (BUSPAR) 5 MG tablet TAKE 1 TABLET BY MOUTH 3 TIMES DAILY. TAKE 1 TABLET WITH THE '15MG'$  TO EQUAL '20MG'$ . 05/06/22   Ronnie Doss M, DO  cetirizine (ZYRTEC) 10 MG tablet Take 10 mg by mouth daily.    [provider]  citalopram (CELEXA) 40 MG tablet Take 1 tablet daily for mood 05/06/22   Ronnie Doss M, DO  clindamycin (CLEOCIN)  300 MG capsule Take 300 mg by mouth 3 (three) times daily. 04/28/22   [provider]  famotidine (PEPCID) 20 MG tablet Take 1 tablet (20 mg total) by mouth daily as needed for heartburn or indigestion. 08/16/20   Milus Banister, MD  hydrochlorothiazide (HYDRODIURIL) 25 MG tablet Take 1 tablet (25 mg total) by mouth daily. 05/06/22   [name], DO  latanoprost (XALATAN) 0.005 % ophthalmic solution Place 1 drop into both eyes at bedtime. 12/13/20   [provider]  linaclotide Rolan Lipa) 290 MCG CAPS capsule Take 1 capsule (290 mcg total) by mouth daily before breakfast. 05/06/22   Ronnie Doss M, DO  lisinopril (ZESTRIL) 10 MG tablet Take 0.5-1 tablets (5-10 mg total) by mouth daily. As directed 05/06/22   [name], DO  nitroGLYCERIN (NITROSTAT) 0.4 MG SL tablet Place 1 tablet (0.4 mg total) under the tongue every 5 (five) minutes as needed for chest pain. Patient not taking: Reported on 05/06/2022 05/30/20 10/21/21  Ronnie Doss M, DO  ondansetron (ZOFRAN) 4 MG tablet Take 4 mg by mouth every 8 (eight) hours as needed for nausea. 07/29/19   [provider]  Oxycodone HCl 10 MG TABS Take by mouth. 05/24/20   [provider]  oxyCODONE-acetaminophen (PERCOCET) 10-325 MG tablet Take 1 tablet by mouth every 4 (four) hours as needed for pain. 07/06/22   Wallene Huh, DPM  polyethylene glycol powder (MIRALAX) 17 GM/SCOOP powder Take 17 g by mouth in the morning and at bedtime. 06/25/21   [name], DO  tiZANidine (ZANAFLEX) 4 MG tablet Take 1 tablet (4 mg total) by mouth every 8 (eight) hours as needed for muscle spasms. 09/09/21   [name], DO  traZODone (DESYREL) 100 MG tablet Take 1 tablet (100 mg total) by mouth at bedtime as needed for sleep. 05/06/22   [name], DO      Allergies    Nsaids    Review of Systems   Review of Systems  Constitutional:  Negative for appetite change and fatigue.  HENT:  Negative  for congestion, ear discharge and sinus pressure.   Eyes:  Negative for discharge.  Respiratory:  Negative for cough.   Cardiovascular:  Negative for chest pain.  Gastrointestinal:  Negative for abdominal pain and diarrhea.  Genitourinary:  Negative for frequency and hematuria.  Musculoskeletal:  Negative for back pain.  Skin:  Negative for rash.  Neurological:  Positive for headaches. Negative for seizures.  Psychiatric/Behavioral:  Negative for hallucinations.     Physical Exam Updated Vital Signs BP (!) 147/79   Pulse 82   Temp (!) 97.5 F (36.4 C) (Oral)   Resp 19   Ht '5\' 4"'$  (1.626 m)   Wt 74.4 kg   SpO2 99%   BMI 28.15 kg/m  Physical Exam Vitals and nursing note reviewed.  Constitutional:      Appearance: She is well-developed.  HENT:     Head: Normocephalic.     Mouth/Throat:     Mouth: Mucous membranes are moist.  Eyes:     General: No scleral icterus.    Conjunctiva/sclera: Conjunctivae normal.  Neck:     Thyroid: No thyromegaly.  Cardiovascular:     Rate and Rhythm: Normal rate and regular rhythm.     Heart sounds: No murmur heard.    No friction rub. No gallop.  Pulmonary:     Breath sounds: No stridor. No wheezing or rales.  Chest:     Chest wall: No tenderness.  Abdominal:     General: There is no distension.     Tenderness: There is no abdominal tenderness. There is no rebound.  Musculoskeletal:        General: Normal range of motion.     Cervical back: Neck supple.  Lymphadenopathy:     Cervical: No cervical adenopathy.  Skin:    Findings: No erythema or rash.  Neurological:     Mental Status: She is alert and oriented to person, place, and time.     Motor: No abnormal muscle tone.     Coordination: Coordination normal.  Psychiatric:        Behavior: Behavior normal.     ED Results / Procedures / Treatments   Labs (all labs ordered are listed, but only abnormal results are displayed) Labs Reviewed  CBC WITH DIFFERENTIAL/PLATELET -  Abnormal; Notable for the following components:      Result Value   WBC 11.9 (*)    RBC 5.33 (*)    Hemoglobin 16.0 (*)    HCT 47.5 (*)    Neutro Abs 8.7 (*)    Abs Immature Granulocytes 0.09 (*)    All other components within normal limits  COMPREHENSIVE METABOLIC PANEL - Abnormal; Notable for the following components:   Glucose, Bld 104 (*)    Alkaline Phosphatase 138 (*)    All other components within normal limits  EKG None  Radiology DG Foot 2 Views Left  Result Date: 07/07/2022 Please see detailed radiograph report in office note.  CT Head Wo Contrast  Result Date: 07/07/2022 CLINICAL DATA:  Dizziness, persistent/recurrent, cardiac or vascular cause suspected EXAM: CT HEAD WITHOUT CONTRAST TECHNIQUE: Contiguous axial images were obtained from the base of the skull through the vertex without intravenous contrast. RADIATION DOSE REDUCTION: This exam was performed according to the departmental dose-optimization program which includes automated exposure control, adjustment of the mA and/or kV according to patient size and/or use of iterative reconstruction technique. COMPARISON:  Head CT 07/09/2016 FINDINGS: Brain: No evidence of acute intracranial hemorrhage or extra-axial collection. No loss of gray-white matter differentiation.Patent basal cisterns. Unchanged right basal ganglia mineralization.The ventricles are normal in size. Vascular: No hyperdense vessel. Skull: Negative for skull fracture. Sinuses/Orbits: Mucosal thickening of the ethmoid air cells, right frontal sinus, and sphenoid sinuses. Mastoid air cells are clear. Orbits are unremarkable. Other: None. IMPRESSION: No acute intracranial abnormality. Paranasal sinus disease most prominent in the ethmoid air cells and right frontal sinus. Electronically Signed   By: Maurine Simmering M.D.   On: 07/07/2022 16:07    Procedures Procedures    Medications Ordered in ED Medications  oxyCODONE-acetaminophen (PERCOCET/ROXICET) 5-325  MG per tablet 1 tablet (1 tablet Oral Given 07/07/22 1625)  HYDROmorphone (DILAUDID) injection 0.5 mg (0.5 mg Intravenous Given 07/07/22 1915)    ED Course/ Medical Decision Making/ A&P                           Medical Decision Making Amount and/or Complexity of Data Reviewed Labs: ordered. Radiology: ordered.  Risk Prescription drug management.  This patient presents to the ED for concern of headache, this involves an extensive number of treatment options, and is a complaint that carries with it a high risk of complications and morbidity.  The differential diagnosis includes poorly controlled blood pressure, migraine   Co morbidities that complicate the patient evaluation  Hypertension   Additional history obtained:  Additional history obtained from friend External records from outside source obtained and reviewed including hospital records   Lab Tests:  I Ordered, and personally interpreted labs.  The pertinent results include: CBC shows white count 11.9 hemoglobin 16 chemistries unremarkable   Imaging Studies ordered:  I ordered imaging studies including CT head I independently visualized and interpreted imaging which showed sinusitis I agree with the radiologist interpretation   Cardiac Monitoring: / EKG:  The patient was maintained on a cardiac monitor.  I personally viewed and interpreted the cardiac monitored which showed an underlying rhythm of: Normal sinus rhythm   Consultations Obtained: No consultant Problem List / ED Course / Critical interventions / Medication management  Hypertension, headache I ordered medication including Dilaudid for headache Reevaluation of the patient after these medicines showed that the patient improved I have reviewed the patients home medicines and have made adjustments as needed   Social Determinants of Health:  None   Test / Admission - Considered:  No other test necessary  Patient with a headache secondary to  poorly controlled hypertension and sinusitis.  She is put on amoxicillin and told to follow back up with her primary care doctor to adjust her blood pressure medicine.  Her blood pressure is doing well after she took 1 extra Apresoline        Final Clinical Impression(s) / ED Diagnoses Final diagnoses:  Other vascular headache  Primary hypertension  Chronic maxillary  sinusitis    Rx / DC Orders ED Discharge Orders          Ordered    amoxicillin (AMOXIL) 500 MG capsule  3 times daily        07/07/22 Marykay Lex, MD 07/08/22 [phone]

## 2022-07-08 NOTE — Telephone Encounter (Signed)
Patient made aware and verbalized understanding, wants to know if she needs to take hydralazine and if so, can it be called in?

## 2022-07-08 NOTE — Telephone Encounter (Signed)
Recommend strict compliance with bp meds until can be evaluated next week.   Zandra Abts MD

## 2022-07-08 NOTE — Telephone Encounter (Signed)
Pt c/o of Chest Pain: STAT if CP now or developed within 24 hours  1. Are you having CP right now? no  2. Are you experiencing any other symptoms (ex. SOB, nausea, vomiting, sweating)? Yesterday bad headache and very nauseas,   3. How long have you been experiencing CP? Since Monday  4. Is your CP continuous or coming and going? Come and go  5. Have you taken Nitroglycerin? No  Patient states she has been having chest pain since Monday and her BP has been very high. She says yesterday her BP was 228/110 and today it is 158/102. She says yesterday she had a bad headache and was very nauseas, but has no other symptoms.     ?

## 2022-07-09 NOTE — Progress Notes (Deleted)
Cardiology Office Note:    Date:  07/09/2022   ID:  [name], DOB 10-11-1961, MRN [id number]  PCP:  [name], [place] Providers Cardiologist:  Carlyle Dolly, MD { Click to update primary MD,subspecialty MD or APP then REFRESH:1}  *** Referring MD: [name], DO   Chief Complaint:  No chief complaint on file. {Click here for Visit Info    :1}    History of Present Illness:   QUINTAVIA ROGSTAD is a 61 y.o. female with  history of HTN, syncope with orthostatic hypotension in setting of dehydration and AKI, chest pain and abnormal EKG with normal NST 2018, HLD.    Patient added onto my schedule with elevated BP's. Went to ED 07/08/22 with BP 170/100. Ran out of hydralazine 25 mg tid and wasn't taking HCTZ consistently. Had Chest pain in ED.    Past Medical History:  Diagnosis Date   Abdominal pain    Abscess, intra-abdominal, postoperative 12/31/2020   Acute blood loss anemia (ABLA) 05/04/2021   Acute ITP ([place]) 05/08/2021   Allergy    seasonal   Anemia 2021   Anxiety    Arthritis    Aspiration pneumonia of both lower lobes due to gastric secretions ([place]) 12/22/2020   Blood in stool    Blood transfusion without reported diagnosis 12/21/2020   Breast changes, fibrocystic    Bruises easily    Cardiomegaly 2018   Clotting disorder ([place])     itp , none since 1976, no current hematologist   Colon perforation due to stercoral ulcer with E. coli fecal peritonitis    Constipation    DDD (degenerative disc disease), cervical    with lumbar issues   Depression    E. coli sepsis -due to fecal peritonitis 12/23/2020   Generalized headaches    GERD (gastroesophageal reflux disease)    Glaucoma    Hemorrhoid    Hyperlipidemia    Hypertension    Idiopathic thrombocytopenia ([place])    as child   Leg swelling    both ankles   Liver lesion    Nasal congestion    Nausea & vomiting    Neuromuscular disorder (H[facility])    back  injury    Neuromuscular disorder ([place])    3 neck surgeries,neck fusion.pin,plates,screws   Osteoporosis    Panic attacks    pt also  has germaphobia   Rectal bleeding    Seizures ([place]) 1998   stress induced, one time, none since, no seizure meds   Seizures ([place])    Stated she had 2 weeks ago,it was like a transient,stare.04/01/21 pt. denies   Spleen enlarged 1976, none since   r/t idiopathic thrombocytopenia   Stercoral ulcer of large intestine 12/21/2020   Trouble swallowing    Unintentional weight loss    Weakness    weakness varies   Current Medications: No outpatient medications have been marked as taking for the 07/14/22 encounter (Appointment) with Imogene Burn, PA-C.    Allergies:   Nsaids   Social History   Tobacco Use   Smoking status: Every Day    Packs/day: 1.00    Years: 10.00    Total pack years: 10.00    Types: Cigarettes   Smokeless tobacco: Never   Tobacco comments:    Started 2007  Vaping Use   Vaping Use: Former   Quit date: 09/07/2016   Devices: only used for about a month  Substance Use Topics   Alcohol  use: No   Drug use: Yes    Types: Marijuana    Family Hx: The patient's family history includes Alcohol abuse in her father; Anxiety disorder in her mother and sister; Arthritis in her paternal uncle; Bipolar disorder in her sister; Cancer in her father and paternal grandmother; Colon cancer in her paternal grandmother; Depression in her mother and sister; Diabetes in her sister; Heart disease in her maternal grandmother, mother, and sister; Hypertension in her mother and sister; Lung cancer in her father. There is no history of Esophageal cancer, Rectal cancer, Stomach cancer, or Breast cancer.  ROS   EKGs/Labs/Other Test Reviewed:    EKG:  EKG is *** ordered today.  The ekg ordered today demonstrates ***  Recent Labs: 07/07/2022: ALT 30; BUN 12; Creatinine, Ser 0.65; Hemoglobin 16.0; Platelets 235; Potassium 3.8; Sodium 137   Recent Lipid  Panel Recent Labs    09/08/21 1429  CHOL 202*  TRIG 134  HDL 53  LDLCALC 125*     Prior CV Studies: {Select studies to display:26339}  ***    Risk Assessment/Calculations/Metrics:   {Does this patient have ATRIAL FIBRILLATION?[phone]}     No BP recorded.  {Refresh Note OR Click here to enter BP  :1}***    Physical Exam:    VS:  There were no vitals taken for this visit.    Wt Readings from Last 3 Encounters:  07/07/22 164 lb (74.4 kg)  05/06/22 186 lb (84.4 kg)  04/21/22 188 lb (85.3 kg)    Physical Exam ***       ASSESSMENT & PLAN:   No problem-specific Assessment & Plan notes found for this encounter.   HTN due to noncompliance  Chest pain w 2018  HLD        {Are you ordering a CV Procedure (e.g. stress test, cath, DCCV, TEE, etc)?   Press F2        :[id number]}   Dispo:  No follow-ups on file.   Medication Adjustments/Labs and Tests Ordered: Current medicines are reviewed at length with the patient today.  Concerns regarding medicines are outlined above.  Tests Ordered: No orders of the defined types were placed in this encounter.  Medication Changes: No orders of the defined types were placed in this encounter.  Sumner Boast, PA-C  07/09/2022 8:46 AM    [facility] [place], [place], [place]  [address] Phone: [phone]; Fax: [phone]

## 2022-07-14 ENCOUNTER — Ambulatory Visit: Payer: Medicare HMO | Admitting: Physician Assistant

## 2022-07-14 DIAGNOSIS — E785 Hyperlipidemia, unspecified: Secondary | ICD-10-CM

## 2022-07-14 DIAGNOSIS — I10 Essential (primary) hypertension: Secondary | ICD-10-CM

## 2022-07-14 DIAGNOSIS — R079 Chest pain, unspecified: Secondary | ICD-10-CM

## 2022-07-14 NOTE — Telephone Encounter (Signed)
Has PA appt today to be addressed  Zandra Abts MD

## 2022-07-15 ENCOUNTER — Encounter: Payer: Medicare HMO | Admitting: Podiatry

## 2022-07-16 DIAGNOSIS — Z79891 Long term (current) use of opiate analgesic: Secondary | ICD-10-CM | POA: Diagnosis not present

## 2022-07-16 DIAGNOSIS — M47896 Other spondylosis, lumbar region: Secondary | ICD-10-CM | POA: Diagnosis not present

## 2022-07-16 DIAGNOSIS — Z79899 Other long term (current) drug therapy: Secondary | ICD-10-CM | POA: Diagnosis not present

## 2022-07-16 DIAGNOSIS — G894 Chronic pain syndrome: Secondary | ICD-10-CM | POA: Diagnosis not present

## 2022-07-16 DIAGNOSIS — M7918 Myalgia, other site: Secondary | ICD-10-CM | POA: Diagnosis not present

## 2022-07-16 DIAGNOSIS — M4322 Fusion of spine, cervical region: Secondary | ICD-10-CM | POA: Diagnosis not present

## 2022-07-22 ENCOUNTER — Ambulatory Visit: Payer: Medicare HMO | Admitting: Family Medicine

## 2022-07-22 ENCOUNTER — Encounter: Payer: Self-pay | Admitting: Family Medicine

## 2022-07-22 ENCOUNTER — Ambulatory Visit (INDEPENDENT_AMBULATORY_CARE_PROVIDER_SITE_OTHER): Payer: Medicare HMO | Admitting: Family Medicine

## 2022-07-22 DIAGNOSIS — J0100 Acute maxillary sinusitis, unspecified: Secondary | ICD-10-CM | POA: Diagnosis not present

## 2022-07-22 MED ORDER — FLUTICASONE PROPIONATE 50 MCG/ACT NA SUSP: 2.0000 | Freq: Every day | NASAL | 6 refills | Status: DC

## 2022-07-22 MED ORDER — AMOXICILLIN-POT CLAVULANATE 875-125 MG PO TABS: 1.0000 | ORAL_TABLET | Freq: Two times a day (BID) | ORAL | 0 refills | Status: DC

## 2022-07-22 MED ORDER — FLUCONAZOLE 150 MG PO TABS: 150.0000 mg | ORAL_TABLET | Freq: Once | ORAL | 0 refills | Status: AC

## 2022-07-22 NOTE — Progress Notes (Signed)
Telephone visit  Subjective: CC: URI PCP: [name], DO [id number] A Kopischke is a 61 y.o. female calls for telephone consult today. Patient provides verbal consent for consult held via phone.  Due to COVID-19 pandemic this visit was conducted virtually. This visit type was conducted due to national recommendations for restrictions regarding the COVID-19 Pandemic (e.g. social distancing, sheltering in place) in an effort to limit this patient's exposure and mitigate transmission in our community. All issues noted in this document were discussed and addressed.  A physical exam was not performed with this format.   Location of patient: home Location of provider: [facility] Others present for call: none  1. Sinusitis Was recently diagnosed with sinus infection in the ER on 07/07/2022.  She was put on amoxicillin 500 mg 3 times daily and she felt that symptoms did get slightly better during that time but when she discontinued the medication it came back with a vengeance.  She is having sinus headaches again, drainage and congestion.  She is using her Allegra but this is not really helping with the symptoms.  Has not tested for COVID but will do so.  No fevers reported no shortness of breath or cough  ROS: Per HPI  Allergies  Allergen Reactions   Nsaids Nausea Only    Abdominal pain   Past Medical History:  Diagnosis Date   Abdominal pain    Abscess, intra-abdominal, postoperative 12/31/2020   Acute blood loss anemia (ABLA) 05/04/2021   Acute ITP ([place]) 05/08/2021   Allergy    seasonal   Anemia 2021   Anxiety    Arthritis    Aspiration pneumonia of both lower lobes due to gastric secretions ([place]) 12/22/2020   Blood in stool    Blood transfusion without reported diagnosis 12/21/2020   Breast changes, fibrocystic    Bruises easily    Cardiomegaly 2018   Clotting disorder ([place])     itp , none since 1976, no current hematologist   Colon perforation due to stercoral ulcer with E. coli  fecal peritonitis    Constipation    DDD (degenerative disc disease), cervical    with lumbar issues   Depression    E. coli sepsis -due to fecal peritonitis 12/23/2020   Generalized headaches    GERD (gastroesophageal reflux disease)    Glaucoma    Hemorrhoid    Hyperlipidemia    Hypertension    Idiopathic thrombocytopenia ([place])    as child   Leg swelling    both ankles   Liver lesion    Nasal congestion    Nausea & vomiting    Neuromuscular disorder (H[facility])    back  injury   Neuromuscular disorder ([place])    3 neck surgeries,neck fusion.pin,plates,screws   Osteoporosis    Panic attacks    pt also  has germaphobia   Rectal bleeding    Seizures ([place]) 1998   stress induced, one time, none since, no seizure meds   Seizures ([place])    Stated she had 2 weeks ago,it was like a transient,stare.04/01/21 pt. denies   Spleen enlarged 1976, none since   r/t idiopathic thrombocytopenia   Stercoral ulcer of large intestine 12/21/2020   Trouble swallowing    Unintentional weight loss    Weakness    weakness varies    Current Outpatient Medications:    amitriptyline (ELAVIL) 25 MG tablet, Take 25-50 mg by mouth See admin instructions. Take 50 mg by mouth at bedtime, may take 25-50 mg during the  day if needed for pain, Disp: , Rfl:    amoxicillin (AMOXIL) 500 MG capsule, Take 1 capsule (500 mg total) by mouth 3 (three) times daily., Disp: 30 capsule, Rfl: 0   atenolol (TENORMIN) 50 MG tablet, Take 1 tablet (50 mg total) by mouth daily., Disp: 90 tablet, Rfl: 3   busPIRone (BUSPAR) 15 MG tablet, TAKE 1 TABLET BY MOUTH 3 TIMES DAILY., Disp: 270 tablet, Rfl: 2   busPIRone (BUSPAR) 5 MG tablet, TAKE 1 TABLET BY MOUTH 3 TIMES DAILY. TAKE 1 TABLET WITH THE '15MG'$  TO EQUAL '20MG'$ ., Disp: 270 tablet, Rfl: 3   cetirizine (ZYRTEC) 10 MG tablet, Take 10 mg by mouth daily., Disp: , Rfl:    citalopram (CELEXA) 40 MG tablet, Take 1 tablet daily for mood, Disp: 90 tablet, Rfl: 3   clindamycin (CLEOCIN) 300 MG  capsule, Take 300 mg by mouth 3 (three) times daily., Disp: , Rfl:    famotidine (PEPCID) 20 MG tablet, Take 1 tablet (20 mg total) by mouth daily as needed for heartburn or indigestion., Disp: 30 tablet, Rfl:    hydrochlorothiazide (HYDRODIURIL) 25 MG tablet, Take 1 tablet (25 mg total) by mouth daily., Disp: 90 tablet, Rfl: 3   latanoprost (XALATAN) 0.005 % ophthalmic solution, Place 1 drop into both eyes at bedtime., Disp: , Rfl:    linaclotide (LINZESS) 290 MCG CAPS capsule, Take 1 capsule (290 mcg total) by mouth daily before breakfast., Disp: 90 capsule, Rfl: 3   lisinopril (ZESTRIL) 10 MG tablet, Take 0.5-1 tablets (5-10 mg total) by mouth daily. As directed, Disp: 90 tablet, Rfl: 3   nitroGLYCERIN (NITROSTAT) 0.4 MG SL tablet, Place 1 tablet (0.4 mg total) under the tongue every 5 (five) minutes as needed for chest pain. (Patient not taking: Reported on 05/06/2022), Disp: 25 tablet, Rfl: 3   ondansetron (ZOFRAN) 4 MG tablet, Take 4 mg by mouth every 8 (eight) hours as needed for nausea., Disp: , Rfl:    Oxycodone HCl 10 MG TABS, Take by mouth., Disp: , Rfl:    oxyCODONE-acetaminophen (PERCOCET) 10-325 MG tablet, Take 1 tablet by mouth every 4 (four) hours as needed for pain., Disp: 20 tablet, Rfl: 0   polyethylene glycol powder (MIRALAX) 17 GM/SCOOP powder, Take 17 g by mouth in the morning and at bedtime., Disp: 225 g, Rfl: PRN   tiZANidine (ZANAFLEX) 4 MG tablet, Take 1 tablet (4 mg total) by mouth every 8 (eight) hours as needed for muscle spasms., Disp: 30 tablet, Rfl: 1   traZODone (DESYREL) 100 MG tablet, Take 1 tablet (100 mg total) by mouth at bedtime as needed for sleep., Disp: 90 tablet, Rfl: 3  Assessment/ Plan: 61 y.o. female   Subacute maxillary sinusitis - Plan: amoxicillin-clavulanate (AUGMENTIN) 875-125 MG tablet, fluticasone (FLONASE) 50 MCG/ACT nasal spray  Good to treat for subacute maxillary sinusitis with Augmentin.  Diflucan also sent.  Flonase.  Okay to use  pseudoephedrine but needs to monitor blood pressures closely.  She will test for COVID-19 and if positive I will certainly be sending over COVID medication.  She understands red flag signs and symptoms warranting further evaluation will follow-up as directed  Start time: 10:08a End time: 10:13a  Total time spent on patient care (including telephone call/ virtual visit): 5 minutes  [place], [place] [phone]

## 2022-07-24 ENCOUNTER — Other Ambulatory Visit: Payer: Self-pay | Admitting: Family Medicine

## 2022-07-24 DIAGNOSIS — F5104 Psychophysiologic insomnia: Secondary | ICD-10-CM

## 2022-07-24 NOTE — Telephone Encounter (Signed)
It does look like pt was taking 1 and 1/2 tablets of trazodone prior to her appt with you 04/2022. Then the sig was changed to just 1 tab PO. Ok to change sig back to 1 and 1/2 tab PO?

## 2022-07-29 ENCOUNTER — Telehealth: Payer: Self-pay | Admitting: Cardiology

## 2022-07-29 ENCOUNTER — Encounter: Payer: Self-pay | Admitting: Podiatry

## 2022-07-29 NOTE — Telephone Encounter (Signed)
     Pre-operative Risk Assessment    Patient Name: [name]  DOB: [birth year] MRN: [id number]      Request for Surgical Clearance    Procedure:   austin bunionectomy right foot and metatarsal head resection right foot   Date of Surgery:  Clearance 08/18/22                                 Surgeon:  Dr. Ila Mcgill Surgeon's Group or Practice Name:  [facility]  Phone number:  [phone] Fax number:  [phone]   Type of Clearance Requested:   - Medical  - Pharmacy:  Hold defer to cards if there's any meds pt need to held prior procedure      Type of Anesthesia:   CHOICE   Additional requests/questions:    Signed, Selinda Orion   07/29/2022, 9:23 AM

## 2022-07-29 NOTE — Telephone Encounter (Signed)
Will send a message to the [facility] location to see if they may help with appt for pre op; pt last seen 2021.

## 2022-07-29 NOTE — Telephone Encounter (Signed)
   Name: [name]  DOB: May 24, 1961  MRN: [id number]  Primary Cardiologist: Carlyle Dolly, MD  Chart reviewed as part of pre-operative protocol coverage. Because of Marianita Botkin Sehgal's past medical history and time since last visit, she will require a follow-up in-office visit in order to better assess preoperative cardiovascular risk. Last OV 2021. Also had phone note 06/2022 for CP/HTN but patient no-showed to visit with Ermalinda Barrios earlier this month. Has not been r/s.  Pre-op covering staff: - Please schedule appointment and call patient to inform them. - Please contact requesting surgeon's office via preferred method (i.e, phone, fax) to inform them of need for appointment prior to surgery.  Re: meds to hold, no blood thinners listed on MAR, need to update med list at [facility].  Charlie Pitter, PA-C  07/29/2022, 11:10 AM

## 2022-07-31 NOTE — Telephone Encounter (Signed)
Pt has appt 08/04/22 with Christen Bame, NP for pre op clearance. There were no openings in [place] or [place] location

## 2022-08-01 NOTE — Progress Notes (Addendum)
Cardiology Office Note:    Date:  08/04/2022   ID:  [name], DOB 1960/11/10, MRN [id number]  PCP:  [name], DO   [facility] Providers Cardiologist:  Carlyle Dolly, MD     Referring MD: [name], DO   Chief Complaint: preoperative cardiac evaluation  History of Present Illness:    [name] is a pleasant 61 y.o. female with a hx of HTN, chest pain, tobacco abuse, aortic atherosclerosis noted on CT, and HLD.   She was last seen in cardiology clinic on 06/17/2020 by Katina Dung, NP.  Reported that she had been taking alprazolam for anxiety and oxycodone for back pain.  She tested positive for [facility] in March 2021 and was tapered off alprazolam.  Since that time she reported her BP has been high.  She had self medicated by increasing lisinopril to 60 mg and atenolol to 150 mg daily. At previous ov, presented with BP 184/100.  Hydralazine was added to regimen. Reported life stress had caused her to smoke more. Was feeling somewhat better after starting buspirone. At ov on 06/17/20, she reported that she had stopped smoking. BP was 132/72 on lisinopril/HCTZ 20/25 mg and hydralazine 50 mg as needed for BP > 140/90. Was advised to return in 6 months for follow-up.   She called our office 07/08/2022 with chest pain for several days and BP very high.  Reported home BP readings 228/110 and 158/102 accompanied by headache and nausea. Was seen in ED 07/07/22 for same and admitted to non-compliance with her medications due to taking care of her mother. Follow-up visit with Ermalinda Barrios, PA was scheduled for which she was a no-show.   Today, she is here for preoperative cardiac evaluation for upcoming foot surgery. Reports BP has been elevated and she is having chest pain. Realized her atenolol was not in her pill box, so she did not take it for several week. Chest pain gets worse with walking and also worse when BP is elevated, is accompanied by nausea, no dyspnea.  Walks for 15 minutes about 2 times per week, otherwise no regular exercise. She denies lower extremity edema, fatigue, palpitations, melena, hematuria, hemoptysis, diaphoresis, weakness, presyncope, syncope, orthopnea, and PND.  Past Medical History:  Diagnosis Date   Abdominal pain    Abscess, intra-abdominal, postoperative 12/31/2020   Acute blood loss anemia (ABLA) 05/04/2021   Acute ITP ([place]) 05/08/2021   Allergy    seasonal   Anemia 2021   Anxiety    Arthritis    Aspiration pneumonia of both lower lobes due to gastric secretions ([place]) 12/22/2020   Blood in stool    Blood transfusion without reported diagnosis 12/21/2020   Breast changes, fibrocystic    Bruises easily    Cardiomegaly 2018   Clotting disorder ([place])     itp , none since 1976, no current hematologist   Colon perforation due to stercoral ulcer with E. coli fecal peritonitis    Constipation    DDD (degenerative disc disease), cervical    with lumbar issues   Depression    E. coli sepsis -due to fecal peritonitis 12/23/2020   Generalized headaches    GERD (gastroesophageal reflux disease)    Glaucoma    Hemorrhoid    Hyperlipidemia    Hypertension    Idiopathic thrombocytopenia (HCC)    as child   Leg swelling    both ankles   Liver lesion    Nasal congestion    Nausea &  vomiting    Neuromuscular disorder (HCC)    back  injury   Neuromuscular disorder ([place])    3 neck surgeries,neck fusion.pin,plates,screws   Osteoporosis    Panic attacks    pt also  has germaphobia   Rectal bleeding    Seizures ([place]) 1998   stress induced, one time, none since, no seizure meds   Seizures ([place])    Stated she had 2 weeks ago,it was like a transient,stare.04/01/21 pt. denies   Spleen enlarged 1976, none since   r/t idiopathic thrombocytopenia   Stercoral ulcer of large intestine 12/21/2020   Trouble swallowing    Unintentional weight loss    Weakness    weakness varies    Past Surgical History:  Procedure  Laterality Date   ABDOMINAL HYSTERECTOMY  1997   BACK SURGERY     CERVICAL SPINE SURGERY  2005, 2007, 2010   x3, fusion done,plates and screws present   [place]  03/14/2012   Procedure: LAPAROSCOPIC CHOLECYSTECTOMY;  Surgeon: Stark Klein, MD;  Location: [facility];  Service: General;  Laterality: N/A;   COLECTOMY WITH COLOSTOMY CREATION/HARTMANN PROCEDURE N/A 12/21/2020   Procedure: COLECTOMY WITH COLOSTOMY CREATION/HARTMANN PROCEDURE;  Surgeon: Virl Cagey, MD;  Location: [facility];  Service: General;  Laterality: N/A;   COLON SURGERY     colonscopy and esophagogastrodudononescopy  02/04/2012   COLOSTOMY REVERSAL N/A 04/28/2021   Procedure: COLOSTOMY REVERSAL;  Surgeon: Virl Cagey, MD;  Location: [facility];  Service: General;  Laterality: N/A;   [place]  2022   [place] N/A 10/29/2021   Procedure: HERNIA REPAIR INCISIONAL; OPEN; WITH MESH;  Surgeon: Virl Cagey, MD;  Location: [facility];  Service: General;  Laterality: N/A;   IR RADIOLOGIST EVAL & MGMT  01/15/2021   LUMBAR [place] SURGERY  11/2014   SPINE SURGERY  2005, 2007, etc   6 surgeries    Current Medications: Current Meds  Medication Sig   amitriptyline (ELAVIL) 25 MG tablet Take 25-50 mg by mouth See admin instructions. Take 50 mg by mouth at bedtime, may take 25-50 mg during the day if needed for pain   busPIRone (BUSPAR) 15 MG tablet TAKE 1 TABLET BY MOUTH 3 TIMES DAILY.   busPIRone (BUSPAR) 5 MG tablet TAKE 1 TABLET BY MOUTH 3 TIMES DAILY. TAKE 1 TABLET WITH THE '15MG'$  TO EQUAL '20MG'$ .   cetirizine (ZYRTEC) 10 MG tablet Take 10 mg by mouth daily.   citalopram (CELEXA) 40 MG tablet Take 1 tablet daily for mood   famotidine (PEPCID) 20 MG tablet Take 1 tablet (20 mg total) by mouth daily as needed for heartburn or indigestion.   fluticasone (FLONASE) 50 MCG/ACT nasal spray Place 2 sprays into both nostrils daily.   hydrALAZINE (APRESOLINE)  50 MG tablet Take 1 tablet (50 mg total) by mouth in the morning and at bedtime.   hydrochlorothiazide (HYDRODIURIL) 25 MG tablet Take 1 tablet (25 mg total) by mouth daily.   latanoprost (XALATAN) 0.005 % ophthalmic solution Place 1 drop into both eyes at bedtime.   linaclotide (LINZESS) 290 MCG CAPS capsule Take 1 capsule (290 mcg total) by mouth daily before breakfast.   lisinopril (ZESTRIL) 10 MG tablet Take 0.5-1 tablets (5-10 mg total) by mouth daily. As directed   nitroGLYCERIN (NITROSTAT) 0.4 MG SL tablet Place 1 tablet (0.4 mg total) under the tongue every 5 (five) minutes as needed for chest pain.  ondansetron (ZOFRAN) 4 MG tablet Take 4 mg by mouth every 8 (eight) hours as needed for nausea.   Oxycodone HCl 10 MG TABS Take by mouth.   polyethylene glycol powder (MIRALAX) 17 GM/SCOOP powder Take 17 g by mouth in the morning and at bedtime.   tiZANidine (ZANAFLEX) 4 MG tablet Take 1 tablet (4 mg total) by mouth every 8 (eight) hours as needed for muscle spasms.   traZODone (DESYREL) 100 MG tablet Take 1.5 tablets (150 mg total) by mouth at bedtime as needed. for sleep   [DISCONTINUED] amoxicillin-clavulanate (AUGMENTIN) 875-125 MG tablet Take 1 tablet by mouth 2 (two) times daily.   [DISCONTINUED] atenolol (TENORMIN) 50 MG tablet Take 1 tablet (50 mg total) by mouth daily.   [DISCONTINUED] oxyCODONE-acetaminophen (PERCOCET) 10-325 MG tablet Take 1 tablet by mouth every 4 (four) hours as needed for pain.     Allergies:   Nsaids   Social History   Socioeconomic History   Marital status: Married    Spouse name: Legrand Como   Number of children: 3   Years of education: 14   Highest education level: Associate degree: academic program  Occupational History   Occupation: Corporate investment banker: UNEMPLOYED  Tobacco Use   Smoking status: Every Day    Packs/day: 1.00    Years: 10.00    Total pack years: 10.00    Types: Cigarettes   Smokeless tobacco: Never   Tobacco comments:    Started  2007  Vaping Use   Vaping Use: Former   Quit date: 09/07/2016   Devices: only used for about a month  Substance and Sexual Activity   Alcohol use: No   Drug use: Yes    Types: Marijuana   Sexual activity: Not Currently  Other Topics Concern   Not on file  Social History Narrative   She lives with her boyfriend.  She used to smoke, quit May 2013, she is not drinking. She has 3 children   Social Determinants of Health   Financial Resource Strain: High Risk (12/01/2021)   Overall Financial Resource Strain (CARDIA)    Difficulty of Paying Living Expenses: Very hard  Food Insecurity: Food Insecurity Present (12/01/2021)   Hunger Vital Sign    Worried About Running Out of Food in the Last Year: Often true    Ran Out of Food in the Last Year: Sometimes true  Transportation Needs: No Transportation Needs (11/20/2021)   PRAPARE - Hydrologist (Medical): No    Lack of Transportation (Non-Medical): No  Physical Activity: Insufficiently Active (11/20/2021)   Exercise Vital Sign    Days of Exercise per Week: 3 days    Minutes of Exercise per Session: 30 min  Stress: Stress Concern Present (11/20/2021)   [place]    Feeling of Stress : To some extent  Social Connections: Socially Integrated (11/20/2021)   Social Connection and Isolation Panel [NHANES]    Frequency of Communication with Friends and Family: Three times a week    Frequency of Social Gatherings with Friends and Family: Once a week    Attends Religious Services: More than 4 times per year    Active Member of [employer] or Organizations: Yes    Attends Music therapist: More than 4 times per year    Marital Status: Married     Family History: The patient's family history includes Alcohol abuse in her father; Anxiety disorder in her mother  and sister; Arthritis in her paternal uncle; Bipolar disorder in her sister; Cancer in her  father and paternal grandmother; Colon cancer in her paternal grandmother; Depression in her mother and sister; Diabetes in her sister; Heart disease in her maternal grandmother, mother, and sister; Hypertension in her mother and sister; Lung cancer in her father. There is no history of Esophageal cancer, Rectal cancer, Stomach cancer, or Breast cancer.  ROS:   Please see the history of present illness.    + chest pain All other systems reviewed and are negative.  Labs/Other Studies Reviewed:    The following studies were reviewed today:  Nuclear stress test 08/05/2017  Nonspecific T wave flattening with stress. The study is normal. No myocardial ischemia or scar. This is a low risk study. Nuclear stress EF: 70%.   Echo 07/01/2017  Left ventricle: The cavity size was normal. Wall thickness was    increased in a pattern of mild LVH. Systolic function was normal.    The estimated ejection fraction was in the range of 60% to 65%.    Wall motion was normal; there were no regional wall motion    abnormalities. Features are consistent with a pseudonormal left    ventricular filling pattern, with concomitant abnormal relaxation    and increased filling pressure (grade 2 diastolic dysfunction).    Indeterminate filling pressures.  - Aortic valve: There was mild regurgitation.  - Mitral valve: There was mild regurgitation.  - Systemic veins: Dilated IVC with normal respiratory variation.    Estimated CVP 8 mmHg.   Recent Labs: 07/07/2022: ALT 30; BUN 12; Creatinine, Ser 0.65; Hemoglobin 16.0; Platelets 235; Potassium 3.8; Sodium 137  Recent Lipid Panel    Component Value Date/Time   CHOL 202 (H) 09/08/2021 1429   TRIG 134 09/08/2021 1429   HDL 53 09/08/2021 1429   CHOLHDL 3.8 09/08/2021 1429   CHOLHDL 4.1 Ratio 01/25/2007 1851   VLDL 32 01/25/2007 1851   LDLCALC 125 (H) 09/08/2021 1429     Risk Assessment/Calculations:      Physical Exam:    VS:  BP 138/80   Pulse 75   Ht  '5\' 4"'$  (1.626 m)   Wt 186 lb (84.4 kg)   SpO2 95%   BMI 31.93 kg/m     Wt Readings from Last 3 Encounters:  08/04/22 186 lb (84.4 kg)  07/07/22 164 lb (74.4 kg)  05/06/22 186 lb (84.4 kg)     GEN:  Well nourished, well developed in no acute distress HEENT: Normal NECK: No JVD; No carotid bruits CARDIAC: RRR, no murmurs, rubs, gallops RESPIRATORY:  Clear to auscultation without rales, wheezing or rhonchi  ABDOMEN: Soft, non-tender, non-distended MUSCULOSKELETAL:  No edema; No deformity. 2+ pedal pulses, equal bilaterally SKIN: Warm and dry NEUROLOGIC:  Alert and oriented x 3 PSYCHIATRIC:  Normal affect   EKG:  EKG is ordered today.  The ekg ordered today demonstrates NSR at 75 bpm, nonspecific ST abnormality   Diagnoses:    1. Chest pain of uncertain etiology   2. Essential hypertension   3. Preoperative cardiovascular examination   4. Tobacco abuse   5. Hyperlipidemia LDL goal <70   6. Aortic atherosclerosis (HCC)    Assessment and Plan:     Preoperative cardiac evaluation: Has foot surgery pending. She is able to achieve > 4 METS activity but is having chest pain. We will get Lexiscan Myoview to rule out ischemia prior to clearing her for surgery. Here risk of MACE is low  pending no evidence of ischemia on nuclear stress test.   Chest pain: History of chest pain with typical features of angina. Previous low risk Myoview in 2017 and again in 2018. Symptoms seem to be exacerbated by high BP. Has not been compliant with follow-up for medical therapy and symptom management.  Encouraged close follow-up to determine cause of chest pain prior to surgery. Increasing anti-hypertensives to see if improvement in BP improves symptoms.   Hypertension: BP is slightly elevated here.  She reports much higher readings at home and at other recent appointments.  Currently out of hydralazine that she uses as needed for BP > 140/80. Will increase atenolol to 50 mg twice daily and have her start  hydralazine 50 mg twice daily for better compliance than as needed. Will have her return in 6-8 weeks for follow-up.   Aortic atherosclerosis on CT/Hyperlipidemia: LDL 125 on 09/08/2021.  Previously on Crestor. Uncertain as to why she is no longer taking this. Will repeat fasting lipid panel on the day of her stress test.  Tobacco abuse: Continues to smoke. Nicotine patches make her nauseous. No clear plan to quit. Complete cessation advised.   Shared Decision Making/Informed Consent The risks [chest pain, shortness of breath, cardiac arrhythmias, dizziness, blood pressure fluctuations, myocardial infarction, stroke/transient ischemic attack, nausea, vomiting, allergic reaction, radiation exposure, metallic taste sensation and life-threatening complications (estimated to be 1 in 10,000)], benefits (risk stratification, diagnosing coronary artery disease, treatment guidance) and alternatives of a nuclear stress test were discussed in detail with [name] and she agrees to proceed.   Disposition:  2 months with Dr. Harl Bowie or APP  Addendum: Nuclear stress test 08/10/2022 reveals no evidence of ischemia, no acute change since previous test 2018.  Preoperative clearance will be forwarded to Dr. Paulla Dolly.  Medication Adjustments/Labs and Tests Ordered: Current medicines are reviewed at length with the patient today.  Concerns regarding medicines are outlined above.  Orders Placed This Encounter  Procedures   Cardiac Stress Test: Informed Consent Details: Physician/Practitioner Attestation; Transcribe to consent form and obtain patient signature   Myocardial Perfusion Imaging   EKG 12-Lead   Meds ordered this encounter  Medications   hydrALAZINE (APRESOLINE) 50 MG tablet    Sig: Take 1 tablet (50 mg total) by mouth in the morning and at bedtime.    Dispense:  180 tablet    Refill:  3   atenolol (TENORMIN) 50 MG tablet    Sig: Take 1 tablet (50 mg total) by mouth 2 (two) times daily.     Dispense:  180 tablet    Refill:  3    Patient Instructions  Medication Instructions:   START Hydralazine one ( 1) tablet by mouth (50 mg ) twice daily.   INCREASE Atenolol one ( 1) tablet by mouth ( 50 mg) twice daily.   *If you need a refill on your cardiac medications before your next appointment, please call your pharmacy*   Lab Work:  None ordered.  If you have labs (blood work) drawn today and your tests are completely normal, you will receive your results only by: [place] (if you have MyChart) OR A paper copy in the mail If you have any lab test that is abnormal or we need to change your treatment, we will call you to review the results.   Testing/Procedures:  You are scheduled for a Myocardial Perfusion Imaging Study on Monday, October 2 at 7:30 am.   Please arrive 15 minutes prior to your appointment time  for registration and insurance purposes.   The test will take approximately 3 to 4 hours to complete; you may bring reading material. If someone comes with you to your appointment, they will need to remain in the main lobby due to limited space in the testing area.   How to prepare for your Myocardial Perfusion test:   Do not eat or drink 3 hours prior to your test, except you may have water.    Do not consume products containing caffeine (regular or decaffeinated) 12 hours prior to your test (ex: coffee, chocolate, soda, tea)   Do bring a list of your current medications with you. If not listed below, you may take your medications as normal.   Bring any held medication to your appointment, as you may be required to take it once the test is complete.   Do wear comfortable clothes (no dresses ) and walking shoes. Tennis shoes are preferred. No heels or open toed shoes.  Do not wear perfume, aftershave or lotions (deodorant is allowed).   If these instructions are not followed, you test will have to be rescheduled.   Please report to [address] for your test. If you have questions or concerns about your appointment, please call the Nuclear Lab at [phone].  If you cannot keep your appointment, please provide 24 hour notification to the Nuclear lab to avoid a possible $50 charge to your account.       Follow-Up: At [facility], you and your health needs are our priority.  As part of our continuing mission to provide you with exceptional heart care, we have created designated Provider Care Teams.  These Care Teams include your primary Cardiologist (physician) and Advanced Practice Providers (APPs -  Physician Assistants and Nurse Practitioners) who all work together to provide you with the care you need, when you need it.  We recommend signing up for the patient portal called "MyChart".  Sign up information is provided on this After Visit Summary.  MyChart is used to connect with patients for Virtual Visits (Telemedicine).  Patients are able to view lab/test results, encounter notes, upcoming appointments, etc.  Non-urgent messages can be sent to your provider as well.   To learn more about what you can do with MyChart, go to [email or web address].    Your next appointment:   2 month(s)  The format for your next appointment:   In Person  Provider:   You will see one of the following Advanced Practice Providers on your designated Care Team:   Ermalinda Barrios, PA-C       Important Information About Sugar         Signed, Emmaline Life, NP  08/04/2022 4:57 PM    [place]

## 2022-08-04 ENCOUNTER — Ambulatory Visit: Payer: Medicare HMO | Attending: Nurse Practitioner | Admitting: Nurse Practitioner

## 2022-08-04 ENCOUNTER — Encounter: Payer: Self-pay | Admitting: Nurse Practitioner

## 2022-08-04 VITALS — BP 138/80 | HR 75 | Ht 64.0 in | Wt 186.0 lb

## 2022-08-04 DIAGNOSIS — E785 Hyperlipidemia, unspecified: Secondary | ICD-10-CM

## 2022-08-04 DIAGNOSIS — R079 Chest pain, unspecified: Secondary | ICD-10-CM | POA: Diagnosis not present

## 2022-08-04 DIAGNOSIS — I10 Essential (primary) hypertension: Secondary | ICD-10-CM

## 2022-08-04 DIAGNOSIS — I700 Atherosclerosis of aorta: Secondary | ICD-10-CM | POA: Diagnosis not present

## 2022-08-04 DIAGNOSIS — Z01810 Encounter for preprocedural cardiovascular examination: Secondary | ICD-10-CM

## 2022-08-04 DIAGNOSIS — Z720 Tobacco use: Secondary | ICD-10-CM

## 2022-08-04 MED ORDER — ATENOLOL 50 MG PO TABS: 50.0000 mg | ORAL_TABLET | Freq: Two times a day (BID) | ORAL | 3 refills | Status: DC

## 2022-08-04 MED ORDER — HYDRALAZINE HCL 50 MG PO TABS: 50.0000 mg | ORAL_TABLET | Freq: Two times a day (BID) | ORAL | 3 refills | Status: DC

## 2022-08-04 NOTE — Patient Instructions (Signed)
Medication Instructions:   START Hydralazine one ( 1) tablet by mouth (50 mg ) twice daily.   INCREASE Atenolol one ( 1) tablet by mouth ( 50 mg) twice daily.   *If you need a refill on your cardiac medications before your next appointment, please call your pharmacy*   Lab Work:  None ordered.  If you have labs (blood work) drawn today and your tests are completely normal, you will receive your results only by: [place] (if you have MyChart) OR A paper copy in the mail If you have any lab test that is abnormal or we need to change your treatment, we will call you to review the results.   Testing/Procedures:  You are scheduled for a Myocardial Perfusion Imaging Study on Monday, October 2 at 7:30 am.   Please arrive 15 minutes prior to your appointment time for registration and insurance purposes.   The test will take approximately 3 to 4 hours to complete; you may bring reading material. If someone comes with you to your appointment, they will need to remain in the main lobby due to limited space in the testing area.   How to prepare for your Myocardial Perfusion test:   Do not eat or drink 3 hours prior to your test, except you may have water.    Do not consume products containing caffeine (regular or decaffeinated) 12 hours prior to your test (ex: coffee, chocolate, soda, tea)   Do bring a list of your current medications with you. If not listed below, you may take your medications as normal.   Bring any held medication to your appointment, as you may be required to take it once the test is complete.   Do wear comfortable clothes (no dresses ) and walking shoes. Tennis shoes are preferred. No heels or open toed shoes.  Do not wear perfume, aftershave or lotions (deodorant is allowed).   If these instructions are not followed, you test will have to be rescheduled.   Please report to [address] for your test. If you have questions or concerns  about your appointment, please call the Nuclear Lab at [phone].  If you cannot keep your appointment, please provide 24 hour notification to the Nuclear lab to avoid a possible $50 charge to your account.       Follow-Up: At [facility], you and your health needs are our priority.  As part of our continuing mission to provide you with exceptional heart care, we have created designated Provider Care Teams.  These Care Teams include your primary Cardiologist (physician) and Advanced Practice Providers (APPs -  Physician Assistants and Nurse Practitioners) who all work together to provide you with the care you need, when you need it.  We recommend signing up for the patient portal called "MyChart".  Sign up information is provided on this After Visit Summary.  MyChart is used to connect with patients for Virtual Visits (Telemedicine).  Patients are able to view lab/test results, encounter notes, upcoming appointments, etc.  Non-urgent messages can be sent to your provider as well.   To learn more about what you can do with MyChart, go to [email or web address].    Your next appointment:   2 month(s)  The format for your next appointment:   In Person  Provider:   You will see one of the following Advanced Practice Providers on your designated Care Team:   Ermalinda Barrios, PA-C       Important Information About  [place]

## 2022-08-05 ENCOUNTER — Telehealth (HOSPITAL_COMMUNITY): Payer: Self-pay | Admitting: *Deleted

## 2022-08-05 ENCOUNTER — Other Ambulatory Visit: Payer: Self-pay | Admitting: Family Medicine

## 2022-08-05 ENCOUNTER — Other Ambulatory Visit: Payer: Self-pay | Admitting: *Deleted

## 2022-08-05 DIAGNOSIS — E785 Hyperlipidemia, unspecified: Secondary | ICD-10-CM

## 2022-08-05 MED ORDER — FLUCONAZOLE 150 MG PO TABS: 150.0000 mg | ORAL_TABLET | Freq: Once | ORAL | 0 refills | Status: AC

## 2022-08-05 NOTE — Telephone Encounter (Signed)
Left message on voicemail per DPR in reference to upcoming appointment scheduled on 08/10/2022 at 7:30 with detailed instructions given per Myocardial Perfusion Study Information Sheet for the test. LM to arrive 15 minutes early, and that it is imperative to arrive on time for appointment to keep from having the test rescheduled. If you need to cancel or reschedule your appointment, please call the office within 24 hours of your appointment. Failure to do so may result in a cancellation of your appointment, and a $50 no show fee. Phone number given for call back for any questions.

## 2022-08-05 NOTE — Telephone Encounter (Signed)
Patient will need a nuclear stress test prior to providing surgical clearance. Will forward note once stress test is completed and read.

## 2022-08-10 ENCOUNTER — Ambulatory Visit: Payer: Medicare HMO | Attending: Internal Medicine

## 2022-08-10 ENCOUNTER — Ambulatory Visit: Payer: Medicare HMO

## 2022-08-10 DIAGNOSIS — R079 Chest pain, unspecified: Secondary | ICD-10-CM | POA: Diagnosis not present

## 2022-08-10 DIAGNOSIS — E785 Hyperlipidemia, unspecified: Secondary | ICD-10-CM

## 2022-08-10 DIAGNOSIS — I10 Essential (primary) hypertension: Secondary | ICD-10-CM | POA: Diagnosis not present

## 2022-08-10 LAB — COMPREHENSIVE METABOLIC PANEL
ALT: 39 IU/L — ABNORMAL HIGH (ref 0–32)
AST: 24 IU/L (ref 0–40)
Albumin/Globulin Ratio: 1.7 (ref 1.2–2.2)
Albumin: 4 g/dL (ref 3.9–4.9)
Alkaline Phosphatase: 118 IU/L (ref 44–121)
BUN/Creatinine Ratio: 26 (ref 12–28)
BUN: 18 mg/dL (ref 8–27)
Bilirubin Total: <0.2 mg/dL (ref 0.0–1.2)
CO2: 23 mmol/L (ref 20–29)
Calcium: 9 mg/dL (ref 8.7–10.3)
Chloride: 103 mmol/L (ref 96–106)
Creatinine, Ser: 0.68 mg/dL (ref 0.57–1.00)
Globulin, Total: 2.3 g/dL (ref 1.5–4.5)
Glucose: 99 mg/dL (ref 70–99)
Potassium: 4.4 mmol/L (ref 3.5–5.2)
Sodium: 140 mmol/L (ref 134–144)
Total Protein: 6.3 g/dL (ref 6.0–8.5)
eGFR: 98 mL/min/{1.73_m2} (ref >59)

## 2022-08-10 LAB — LIPID PANEL
Chol/HDL Ratio: 4.7 ratio — ABNORMAL HIGH (ref 0.0–4.4)
Cholesterol, Total: 201 mg/dL — ABNORMAL HIGH (ref 100–199)
HDL: 43 mg/dL (ref >39)
LDL Chol Calc (NIH): 102 mg/dL — ABNORMAL HIGH (ref 0–99)
Triglycerides: 330 mg/dL — ABNORMAL HIGH (ref 0–149)
VLDL Cholesterol Cal: 56 mg/dL — ABNORMAL HIGH (ref 5–40)

## 2022-08-10 LAB — MYOCARDIAL PERFUSION IMAGING
LV dias vol: 66 mL (ref 46–106)
LV sys vol: 22 mL
Nuc Stress EF: 67 %
Peak HR: 79 {beats}/min
Rest HR: 69 {beats}/min
Rest Nuclear Isotope Dose: 10.2 mCi
SDS: 0
SRS: 0
SSS: 0
ST Depression (mm): 0 mm
Stress Nuclear Isotope Dose: 30.1 mCi
TID: 0.98

## 2022-08-10 MED ORDER — REGADENOSON 0.4 MG/5ML IV SOLN
0.4000 mg | Freq: Once | INTRAVENOUS | Status: AC
  Administered 2022-08-10: 0.4 mg via INTRAVENOUS

## 2022-08-10 MED ORDER — TECHNETIUM TC 99M TETROFOSMIN IV KIT
10.2000 | PACK | Freq: Once | INTRAVENOUS | Status: AC | PRN
  Administered 2022-08-10: 10.2 via INTRAVENOUS

## 2022-08-10 MED ORDER — TECHNETIUM TC 99M TETROFOSMIN IV KIT
30.1000 | PACK | Freq: Once | INTRAVENOUS | Status: AC | PRN
  Administered 2022-08-10: 30.1 via INTRAVENOUS

## 2022-08-12 DIAGNOSIS — G894 Chronic pain syndrome: Secondary | ICD-10-CM | POA: Diagnosis not present

## 2022-08-12 DIAGNOSIS — M4322 Fusion of spine, cervical region: Secondary | ICD-10-CM | POA: Diagnosis not present

## 2022-08-12 DIAGNOSIS — M7918 Myalgia, other site: Secondary | ICD-10-CM | POA: Diagnosis not present

## 2022-08-12 DIAGNOSIS — M47896 Other spondylosis, lumbar region: Secondary | ICD-10-CM | POA: Diagnosis not present

## 2022-08-14 ENCOUNTER — Encounter: Payer: Self-pay | Admitting: Nurse Practitioner

## 2022-08-14 ENCOUNTER — Ambulatory Visit (INDEPENDENT_AMBULATORY_CARE_PROVIDER_SITE_OTHER): Payer: Medicare HMO | Admitting: Nurse Practitioner

## 2022-08-14 DIAGNOSIS — Z91199 Patient's noncompliance with other medical treatment and regimen due to unspecified reason: Secondary | ICD-10-CM

## 2022-08-14 NOTE — Progress Notes (Signed)
Patient did not answer phone call after two attempts

## 2022-08-17 ENCOUNTER — Encounter: Payer: Self-pay | Admitting: Family Medicine

## 2022-08-17 ENCOUNTER — Other Ambulatory Visit: Payer: Self-pay | Admitting: Family Medicine

## 2022-08-17 ENCOUNTER — Ambulatory Visit (INDEPENDENT_AMBULATORY_CARE_PROVIDER_SITE_OTHER): Payer: Medicare HMO | Admitting: Family Medicine

## 2022-08-17 ENCOUNTER — Ambulatory Visit (INDEPENDENT_AMBULATORY_CARE_PROVIDER_SITE_OTHER): Payer: Medicare HMO

## 2022-08-17 VITALS — BP 142/81 | HR 79 | Temp 98.7°F | Ht 64.0 in | Wt 190.0 lb

## 2022-08-17 DIAGNOSIS — F17200 Nicotine dependence, unspecified, uncomplicated: Secondary | ICD-10-CM

## 2022-08-17 DIAGNOSIS — E782 Mixed hyperlipidemia: Secondary | ICD-10-CM

## 2022-08-17 DIAGNOSIS — T466X5A Adverse effect of antihyperlipidemic and antiarteriosclerotic drugs, initial encounter: Secondary | ICD-10-CM

## 2022-08-17 DIAGNOSIS — J40 Bronchitis, not specified as acute or chronic: Secondary | ICD-10-CM

## 2022-08-17 DIAGNOSIS — Z0001 Encounter for general adult medical examination with abnormal findings: Secondary | ICD-10-CM

## 2022-08-17 DIAGNOSIS — I700 Atherosclerosis of aorta: Secondary | ICD-10-CM | POA: Diagnosis not present

## 2022-08-17 DIAGNOSIS — R69 Illness, unspecified: Secondary | ICD-10-CM | POA: Diagnosis not present

## 2022-08-17 DIAGNOSIS — I10 Essential (primary) hypertension: Secondary | ICD-10-CM | POA: Diagnosis not present

## 2022-08-17 DIAGNOSIS — M609 Myositis, unspecified: Secondary | ICD-10-CM

## 2022-08-17 DIAGNOSIS — J209 Acute bronchitis, unspecified: Secondary | ICD-10-CM | POA: Diagnosis not present

## 2022-08-17 DIAGNOSIS — Z0000 Encounter for general adult medical examination without abnormal findings: Secondary | ICD-10-CM

## 2022-08-17 DIAGNOSIS — F411 Generalized anxiety disorder: Secondary | ICD-10-CM

## 2022-08-17 MED ORDER — OXYCODONE-ACETAMINOPHEN 10-325 MG PO TABS: 1.0000 | ORAL_TABLET | ORAL | 0 refills | Status: DC | PRN

## 2022-08-17 MED ORDER — ALBUTEROL SULFATE HFA 108 (90 BASE) MCG/ACT IN AERS: 2.0000 | INHALATION_SPRAY | Freq: Four times a day (QID) | RESPIRATORY_TRACT | 0 refills | Status: DC | PRN

## 2022-08-17 MED ORDER — NEXLETOL 180 MG PO TABS: 180.0000 mg | ORAL_TABLET | Freq: Every day | ORAL | 3 refills | Status: DC

## 2022-08-17 MED ORDER — IPRATROPIUM-ALBUTEROL 0.5-2.5 (3) MG/3ML IN SOLN
3.0000 mL | Freq: Once | RESPIRATORY_TRACT | Status: AC
  Administered 2022-08-17: 3 mL via RESPIRATORY_TRACT

## 2022-08-17 MED ORDER — ONDANSETRON HCL 4 MG PO TABS: 4.0000 mg | ORAL_TABLET | Freq: Three times a day (TID) | ORAL | 0 refills | Status: AC | PRN

## 2022-08-17 NOTE — Addendum Note (Signed)
Addended by: Wallene Huh on: 08/17/2022 05:05 PM   Modules accepted: Orders

## 2022-08-17 NOTE — Patient Instructions (Signed)
  Acute Bronchitis, Adult  Acute bronchitis is when air tubes in the lungs (bronchi) suddenly get swollen. The condition can make it hard for you to breathe. In adults, acute bronchitis usually goes away within 2 weeks. A cough caused by bronchitis may last up to 3 weeks. Smoking, allergies, and asthma can make the condition worse. What are the causes? Germs that cause cold and flu (viruses). The most common cause of this condition is the virus that causes the common cold. Bacteria. Substances that bother (irritate) the lungs, including: Smoke from cigarettes and other types of tobacco. Dust and pollen. Fumes from chemicals, gases, or burned fuel. Indoor or outdoor air pollution. What increases the risk? A weak body's defense system. This is also called the immune system. Any condition that affects your lungs and breathing, such as asthma. What are the signs or symptoms? A cough. Coughing up clear, yellow, or green mucus. Making high-pitched whistling sounds when you breathe, most often when you breathe out (wheezing). Runny or stuffy nose. Having too much mucus in your lungs (chest congestion). Shortness of breath. Body aches. A sore throat. How is this treated? Acute bronchitis may go away over time without treatment. Your doctor may tell you to: Drink more fluids. This will help thin your mucus so it is easier to cough up. Use a device that gets medicine into your lungs (inhaler). Use a vaporizer or a humidifier. These are machines that add water to the air. This helps with coughing and poor breathing. Take a medicine that thins mucus and helps clear it from your lungs. Take a medicine that prevents or stops coughing. It is not common to take an antibiotic medicine for this condition. Follow these instructions at home:  Take over-the-counter and prescription medicines only as told by your doctor. Use an inhaler, vaporizer, or humidifier as told by your doctor. Take two  teaspoons (10 mL) of honey at bedtime. This helps lessen your coughing at night. Drink enough fluid to keep your pee (urine) pale yellow. Do not smoke or use any products that contain nicotine or tobacco. If you need help quitting, ask your doctor. Get a lot of rest. Return to your normal activities when your doctor says that it is safe. Keep all follow-up visits. How is this prevented?  Wash your hands often with soap and water for at least 20 seconds. If you cannot use soap and water, use hand sanitizer. Avoid contact with people who have cold symptoms. Try not to touch your mouth, nose, or eyes with your hands. Avoid breathing in smoke or chemical fumes. Make sure to get the flu shot every year. Contact a doctor if: Your symptoms do not get better in 2 weeks. You have trouble coughing up the mucus. Your cough keeps you awake at night. You have a fever. Get help right away if: You cough up blood. You have chest pain. You have very bad shortness of breath. You faint or keep feeling like you are going to faint. You have a very bad headache. Your fever or chills get worse. These symptoms may be an emergency. Get help right away. Call your local emergency services (911 in the [place] Do not wait to see if the symptoms will go away. Do not drive yourself to the hospital. Summary Acute bronchitis is when air tubes in the lungs (bronchi) suddenly get swollen. In adults, acute bronchitis usually goes away within 2 weeks. Drink more fluids. This will help thin your mucus so it   is easier to cough up. Take over-the-counter and prescription medicines only as told by your doctor. Contact a doctor if your symptoms do not improve after 2 weeks of treatment. This information is not intended to replace advice given to you by your health care provider. Make sure you discuss any questions you have with your health care provider. Document Revised: 02/26/2021 Document Reviewed: 02/26/2021 Elsevier  Patient Education  2023 Elsevier Inc.  

## 2022-08-17 NOTE — Progress Notes (Signed)
[name] is a 61 y.o. female presents to office today for annual physical exam examination.    Concerns today include: 1.  Bronchitis Patient reports that she has been having ongoing coughing for the last couple of days.  She started with a sinus infection but since moved to her chest.  No fevers, chills.  No hemoptysis.  She feels tight in the chest.  Not currently utilizing any inhalers.  She did test COVID-negative at home.  2.  Hyperlipidemia associated with aortic atherosclerosis Patient was advised to resume Crestor 20 mg daily but never did get the call from her cardiology clinic with her lipid results.  She notes that she cannot tolerate statins and does not wish to go back on them.  She would rather take the risk of stroke and heart attack.  She is an active every day smoker.  She would be willing however to try something that was not on statin in nature  Diet: Nonrestricted, Exercise: No structured secondary to foot issues.  Has foot surgery planned for tomorrow Last colonoscopy: UTD Last mammogram: UTD Last pap smear: n/a Refills needed today: None Immunizations needed: We will hold off on flu shot since not feeling well Immunization History  Administered Date(s) Administered   Influenza,inj,Quad PF,6+ Mos 10/06/2016, 08/05/2017, 08/08/2018, 09/06/2020   Influenza-Unspecified 05/28/2021   Moderna Sars-Covid-2 Vaccination 02/19/2020, 03/18/2020, 10/08/2020, 05/28/2021   PFIZER Comirnaty(Gray Top)Covid-19 Tri-Sucrose Vaccine 05/28/2021   Pfizer Covid-19 Vaccine Bivalent Booster 75yr & up 09/06/2021   Pneumococcal Conjugate-13 10/06/2016   Tdap 02/04/2018   Zoster Recombinat (Shingrix) 09/06/2018     Past Medical History:  Diagnosis Date   Abdominal pain    Abscess, intra-abdominal, postoperative 12/31/2020   Acute blood loss anemia (ABLA) 05/04/2021   Acute ITP ([facility] 05/08/2021   Allergy    seasonal   Anemia 2021   Anxiety    Arthritis    Aspiration  pneumonia of both lower lobes due to gastric secretions ([facility] 12/22/2020   Blood in stool    Blood transfusion without reported diagnosis 12/21/2020   Breast changes, fibrocystic    Bruises easily    Cardiomegaly 2018   Clotting disorder ([facility]     itp , none since 1976, no current hematologist   Colon perforation due to stercoral ulcer with E. coli fecal peritonitis    Constipation    DDD (degenerative disc disease), cervical    with lumbar issues   Depression    E. coli sepsis -due to fecal peritonitis 12/23/2020   Generalized headaches    GERD (gastroesophageal reflux disease)    Glaucoma    Hemorrhoid    Hyperlipidemia    Hypertension    Idiopathic thrombocytopenia (HCC)    as child   Leg swelling    both ankles   Liver lesion    Nasal congestion    Nausea & vomiting    Neuromuscular disorder (HCC)    back  injury   Neuromuscular disorder ([facility]    3 neck surgeries,neck fusion.pin,plates,screws   Osteoporosis    Panic attacks    pt also  has germaphobia   Rectal bleeding    Seizures ([facility] 1998   stress induced, one time, none since, no seizure meds   Seizures ([facility]    Stated she had 2 weeks ago,it was like a transient,stare.04/01/21 pt. denies   Spleen enlarged 1976, none since   r/t idiopathic thrombocytopenia   Stercoral ulcer of large intestine 12/21/2020   Trouble swallowing  Unintentional weight loss    Weakness    weakness varies   Social History   Socioeconomic History   Marital status: Married    Spouse name: Legrand Como   Number of children: 3   Years of education: 14   Highest education level: Associate degree: academic program  Occupational History   Occupation: Corporate investment banker: UNEMPLOYED  Tobacco Use   Smoking status: Every Day    Packs/day: 1.00    Years: 10.00    Total pack years: 10.00    Types: Cigarettes   Smokeless tobacco: Never   Tobacco comments:    Started 2007  Vaping Use   Vaping Use: Former   Quit date: 09/07/2016    Devices: only used for about a month  Substance and Sexual Activity   Alcohol use: No   Drug use: Yes    Types: Marijuana   Sexual activity: Not Currently  Other Topics Concern   Not on file  Social History Narrative   She lives with her boyfriend.  She used to smoke, quit May 2013, she is not drinking. She has 3 children   Social Determinants of Health   Financial Resource Strain: High Risk (12/01/2021)   Overall Financial Resource Strain (CARDIA)    Difficulty of Paying Living Expenses: Very hard  Food Insecurity: Food Insecurity Present (12/01/2021)   Hunger Vital Sign    Worried About Running Out of Food in the Last Year: Often true    Ran Out of Food in the Last Year: Sometimes true  Transportation Needs: No Transportation Needs (11/20/2021)   PRAPARE - Hydrologist (Medical): No    Lack of Transportation (Non-Medical): No  Physical Activity: Insufficiently Active (11/20/2021)   Exercise Vital Sign    Days of Exercise per Week: 3 days    Minutes of Exercise per Session: 30 min  Stress: Stress Concern Present (11/20/2021)   [place]    Feeling of Stress : To some extent  Social Connections: Socially Integrated (11/20/2021)   Social Connection and Isolation Panel [NHANES]    Frequency of Communication with Friends and Family: Three times a week    Frequency of Social Gatherings with Friends and Family: Once a week    Attends Religious Services: More than 4 times per year    Active Member of Clubs or Organizations: Yes    Attends Archivist Meetings: More than 4 times per year    Marital Status: Married  Human resources officer Violence: Not At Risk (11/20/2021)   Humiliation, Afraid, Rape, and Kick questionnaire    Fear of Current or Ex-Partner: No    Emotionally Abused: No    Physically Abused: No    Sexually Abused: No   Past Surgical History:  Procedure Laterality Date    [place]  2005, 2007, 2010   x3, fusion done,plates and screws present   [place]  03/14/2012   Procedure: LAPAROSCOPIC CHOLECYSTECTOMY;  Surgeon: Stark Klein, MD;  Location: [facility];  Service: General;  Laterality: N/A;   COLECTOMY WITH COLOSTOMY CREATION/HARTMANN PROCEDURE N/A 12/21/2020   Procedure: COLECTOMY WITH COLOSTOMY CREATION/HARTMANN PROCEDURE;  Surgeon: Virl Cagey, MD;  Location: [facility];  Service: General;  Laterality: N/A;   COLON SURGERY     colonscopy and esophagogastrodudononescopy  02/04/2012   COLOSTOMY REVERSAL N/A  04/28/2021   Procedure: COLOSTOMY REVERSAL;  Surgeon: Virl Cagey, MD;  Location: [facility];  Service: General;  Laterality: N/A;   [place]  2022   INCISIONAL HERNIA REPAIR N/A 10/29/2021   Procedure: HERNIA REPAIR INCISIONAL; OPEN; WITH MESH;  Surgeon: Virl Cagey, MD;  Location: [facility];  Service: General;  Laterality: N/A;   IR RADIOLOGIST EVAL & MGMT  01/15/2021   LUMBAR [place] SURGERY  11/2014   SPINE SURGERY  2005, 2007, etc   6 surgeries   Family History  Problem Relation Age of Onset   Hypertension Mother    Heart disease Mother    Anxiety disorder Mother    Depression Mother    Lung cancer Father    Cancer Father        lung   Alcohol abuse Father    Diabetes Sister    Bipolar disorder Sister    Anxiety disorder Sister    Heart disease Sister        Congestive Heart Failure   Hypertension Sister    Depression Sister    Heart disease Maternal Grandmother    Colon cancer Paternal Grandmother    Cancer Paternal Grandmother        colon   Arthritis Paternal Uncle    Esophageal cancer Neg Hx    Rectal cancer Neg Hx    Stomach cancer Neg Hx    Breast cancer Neg Hx     Current Outpatient Medications:    amitriptyline (ELAVIL) 25 MG tablet, Take 25-50 mg by mouth See admin instructions. Take 50  mg by mouth at bedtime, may take 25-50 mg during the day if needed for pain, Disp: , Rfl:    atenolol (TENORMIN) 50 MG tablet, Take 1 tablet (50 mg total) by mouth 2 (two) times daily., Disp: 180 tablet, Rfl: 3   busPIRone (BUSPAR) 15 MG tablet, TAKE 1 TABLET BY MOUTH 3 TIMES DAILY., Disp: 270 tablet, Rfl: 2   busPIRone (BUSPAR) 5 MG tablet, TAKE 1 TABLET BY MOUTH 3 TIMES DAILY. TAKE 1 TABLET WITH THE '15MG'$  TO EQUAL '20MG'$ ., Disp: 270 tablet, Rfl: 3   cetirizine (ZYRTEC) 10 MG tablet, Take 10 mg by mouth daily., Disp: , Rfl:    citalopram (CELEXA) 40 MG tablet, Take 1 tablet daily for mood, Disp: 90 tablet, Rfl: 3   famotidine (PEPCID) 20 MG tablet, Take 1 tablet (20 mg total) by mouth daily as needed for heartburn or indigestion., Disp: 30 tablet, Rfl:    fluticasone (FLONASE) 50 MCG/ACT nasal spray, Place 2 sprays into both nostrils daily., Disp: 16 g, Rfl: 6   hydrALAZINE (APRESOLINE) 50 MG tablet, Take 1 tablet (50 mg total) by mouth in the morning and at bedtime., Disp: 180 tablet, Rfl: 3   hydrochlorothiazide (HYDRODIURIL) 25 MG tablet, Take 1 tablet (25 mg total) by mouth daily., Disp: 90 tablet, Rfl: 3   latanoprost (XALATAN) 0.005 % ophthalmic solution, Place 1 drop into both eyes at bedtime., Disp: , Rfl:    linaclotide (LINZESS) 290 MCG CAPS capsule, Take 1 capsule (290 mcg total) by mouth daily before breakfast., Disp: 90 capsule, Rfl: 3   lisinopril (ZESTRIL) 10 MG tablet, Take 0.5-1 tablets (5-10 mg total) by mouth daily. As directed, Disp: 90 tablet, Rfl: 3   nitroGLYCERIN (NITROSTAT) 0.4 MG SL tablet, Place 1 tablet (0.4 mg total) under the tongue every 5 (five) minutes as needed for chest pain., Disp: 25 tablet, Rfl: 3  ondansetron (ZOFRAN) 4 MG tablet, Take 4 mg by mouth every 8 (eight) hours as needed for nausea., Disp: , Rfl:    Oxycodone HCl 10 MG TABS, Take by mouth., Disp: , Rfl:    polyethylene glycol powder (MIRALAX) 17 GM/SCOOP powder, Take 17 g by mouth in the morning and at  bedtime., Disp: 225 g, Rfl: PRN   tiZANidine (ZANAFLEX) 4 MG tablet, Take 1 tablet (4 mg total) by mouth every 8 (eight) hours as needed for muscle spasms., Disp: 30 tablet, Rfl: 1   traZODone (DESYREL) 100 MG tablet, Take 1.5 tablets (150 mg total) by mouth at bedtime as needed. for sleep, Disp: 135 tablet, Rfl: 3  Allergies  Allergen Reactions   Nsaids Nausea Only    Abdominal pain     ROS: Review of Systems Pertinent items noted in HPI and remainder of comprehensive ROS otherwise negative.    Physical exam BP (!) 142/81   Pulse 79   Temp 98.7 F (37.1 C)   Ht '5\' 4"'$  (1.626 m)   Wt 190 lb (86.2 kg)   SpO2 92%   BMI 32.61 kg/m  General appearance: alert, cooperative, appears stated age, no distress, and moderately obese Head: Normocephalic, without obvious abnormality, atraumatic Eyes: negative findings: lids and lashes normal, conjunctivae and sclerae normal, corneas clear, and pupils equal, round, reactive to light and accomodation Ears: normal TM's and external ear canals both ears Nose: Nares normal. Septum midline. Mucosa normal. No drainage or sinus tenderness. Throat:  Poor dentition.  Oropharynx without erythema or masses Neck: no adenopathy, no carotid bruit, supple, symmetrical, trachea midline, and thyroid not enlarged, symmetric, no tenderness/mass/nodules Back: symmetric, no curvature. ROM normal. No CVA tenderness. Lungs:  Decreased breath sounds throughout.  Mild expiratory wheezes in the apex is noted. Heart: regular rate and rhythm, S1, S2 normal, no murmur, click, rub or gallop Abdomen: soft, non-tender; bowel sounds normal; no masses,  no organomegaly Extremities: extremities normal, atraumatic, no cyanosis or edema Pulses: 2+ and symmetric Skin: Skin color, texture, turgor normal. No rashes or lesions Lymph nodes: Cervical, supraclavicular, and axillary nodes normal. Neurologic: Grossly normal Psych: Mood stable, speech normal   Flowsheet Row Office  Visit from 08/17/2022 in [facility]  PHQ-2 Total Score 1         Assessment/ Plan: [name] here for annual physical exam.   Annual physical exam  Bronchitis - Plan: DG Chest 2 View, ipratropium-albuterol (DUONEB) 0.5-2.5 (3) MG/3ML nebulizer solution 3 mL, albuterol (VENTOLIN HFA) 108 (90 Base) MCG/ACT inhaler  Tobacco use disorder - Plan: DG Chest 2 View  Mixed hyperlipidemia - Plan: Lipid panel, Hepatic function panel, Bempedoic Acid (NEXLETOL) 180 MG TABS  Statin-induced myositis - Plan: Bempedoic Acid (NEXLETOL) 180 MG TABS  Aortic atherosclerosis (HCC) - Plan: Bempedoic Acid (NEXLETOL) 180 MG TABS  Essential hypertension  GAD (generalized anxiety disorder)  Personal view of chest x-ray demonstrated no acute pulmonary infiltrates.  Very consistent with bronchitis and enlarged lung field suggestive of COPD.  She was given a DuoNeb solution here in the office and this did seem to help open her up and make her feel little better.  I sent in albuterol inhaler for her to continue using as needed.  Highly recommended tobacco cessation and she is in the contemplative state of this.  2 weeks worth of samples given for the hyperlipidemia noted.  She is very adamant that she will not resume statin due to issues in the past with the  medicine.  I have placed a future order for lipid panel, liver function panel.  Anticipate we will likely need prior authorization for this particular medication.  She does have known aortic atherosclerosis and certainly has risk factors for cardiovascular disease including hypertension and tobacco use.  Counseled on healthy lifestyle choices, including diet (rich in fruits, vegetables and lean meats and low in salt and simple carbohydrates) and exercise (at least 30 minutes of moderate physical activity daily).     Eligio Angert M. Lajuana Ripple, DO

## 2022-08-18 ENCOUNTER — Encounter: Payer: Self-pay | Admitting: Podiatry

## 2022-08-18 DIAGNOSIS — M2011 Hallux valgus (acquired), right foot: Secondary | ICD-10-CM | POA: Diagnosis not present

## 2022-08-18 DIAGNOSIS — M21621 Bunionette of right foot: Secondary | ICD-10-CM | POA: Diagnosis not present

## 2022-08-18 NOTE — Telephone Encounter (Signed)
Can you assist?  Is this on patient assistance at all?

## 2022-08-18 NOTE — Addendum Note (Signed)
Addended by: [name] on: 08/18/2022 10:12 AM   Modules accepted: Orders

## 2022-08-18 NOTE — Telephone Encounter (Signed)
Name from pharmacy: [facility] [address] comment: Alternative Requested:NOT COVERED BY INSURANCE. PLEASE SEND NEW RX FOR ALTERNATE THERAPY. [place], [place].

## 2022-08-24 ENCOUNTER — Ambulatory Visit (INDEPENDENT_AMBULATORY_CARE_PROVIDER_SITE_OTHER): Payer: Medicare HMO

## 2022-08-24 ENCOUNTER — Telehealth: Payer: Self-pay | Admitting: Podiatry

## 2022-08-24 ENCOUNTER — Ambulatory Visit (INDEPENDENT_AMBULATORY_CARE_PROVIDER_SITE_OTHER): Payer: Medicare HMO | Admitting: Podiatry

## 2022-08-24 ENCOUNTER — Encounter: Payer: Medicare HMO | Admitting: Podiatry

## 2022-08-24 VITALS — Temp 97.4°F

## 2022-08-24 DIAGNOSIS — Z9889 Other specified postprocedural states: Secondary | ICD-10-CM

## 2022-08-24 MED ORDER — DOXYCYCLINE HYCLATE 100 MG PO TABS: 100.0000 mg | ORAL_TABLET | Freq: Two times a day (BID) | ORAL | 1 refills | Status: DC

## 2022-08-24 NOTE — Telephone Encounter (Signed)
Patient called to reschedule her first POV connected to surgical scheduler and sent a message to her as well.

## 2022-08-26 ENCOUNTER — Ambulatory Visit (INDEPENDENT_AMBULATORY_CARE_PROVIDER_SITE_OTHER): Payer: Medicare HMO | Admitting: Family Medicine

## 2022-08-26 ENCOUNTER — Encounter: Payer: Self-pay | Admitting: Family Medicine

## 2022-08-26 DIAGNOSIS — T3695XA Adverse effect of unspecified systemic antibiotic, initial encounter: Secondary | ICD-10-CM

## 2022-08-26 DIAGNOSIS — B379 Candidiasis, unspecified: Secondary | ICD-10-CM | POA: Diagnosis not present

## 2022-08-26 MED ORDER — FLUCONAZOLE 150 MG PO TABS: ORAL_TABLET | ORAL | 0 refills | Status: DC

## 2022-08-26 NOTE — Progress Notes (Signed)
Subjective:   Patient ID: [name], female   DOB: 61 y.o.   MRN: [id number]   HPI Patient admits that she has been noncompliant and has changed her dressing 3 times herself on her own choice since the surgery.  States that she knows she was not supposed to do that and has some discomfort   ROS      Objective:  Physical Exam  Neurovascular status intact negative Bevelyn Buckles' sign noted there is some very mild gapping of the first metatarsal incision site localized no drainage noted minimal erythema no proximal edema erythema drainage noted but moderate swelling secondary to the stress from losing compression from patient taking her own dressings off.  Overall structure looks good     Assessment:  Noncompliant patient who chose to remove her own dressings with mild redness around the incision site secondary to exposure     Plan:  Reviewed condition great length at this point I will start her on an antibiotic I advised the importance of leaving her dressing on completely which was reapplied today sterile and elevation compression immobilization reappoint 2 weeks or earlier if needed  X-rays indicate osteotomies are healing well fixation in place joint congruence.  Placed on doxycycline twice daily

## 2022-08-26 NOTE — Progress Notes (Signed)
   Virtual Visit  Note Due to COVID-19 pandemic this visit was conducted virtually. This visit type was conducted due to national recommendations for restrictions regarding the COVID-19 Pandemic (e.g. social distancing, sheltering in place) in an effort to limit this patient's exposure and mitigate transmission in our community. All issues noted in this document were discussed and addressed.  A physical exam was not performed with this format.  I connected with [name] on 08/26/22 at 1140 by telephone and verified that I am speaking with the correct person using two identifiers. [name] is currently located at home and no one is currently with her during the visit. The provider, Gwenlyn Perking, FNP is located in their office at time of visit.  I discussed the limitations, risks, security and privacy concerns of performing an evaluation and management service by telephone and the availability of in person appointments. I also discussed with the patient that there may be a patient responsible charge related to this service. The patient expressed understanding and agreed to proceed.  CC: yeast infection  History and Present Illness:  HPI [name] was started on doxycyline for a foot infection by her foot surgeon. Since starting this, she has developed a yeast infection. She reports vaginal itching and white, clumpy discharge. Denies fever, dysuria, or concern for STIs. She has not tried any remedies.   ROS As per HPI.   Observations/Objective: Alert and oriented x 3. Able to speak in full sentences without difficulty.    Assessment and Plan: [name] was seen today for vaginitis.  Diagnoses and all orders for this visit:  Antibiotic-induced yeast infection Diflucan as below.  -     fluconazole (DIFLUCAN) 150 MG tablet; Take one tablet by mouth now. May repeat in 3 days if symptoms persist.   Follow Up Instructions: Return to office for new or worsening symptoms, or if  symptoms persist.     I discussed the assessment and treatment plan with the patient. The patient was provided an opportunity to ask questions and all were answered. The patient agreed with the plan and demonstrated an understanding of the instructions.   The patient was advised to call back or seek an in-person evaluation if the symptoms worsen or if the condition fails to improve as anticipated.  The above assessment and management plan was discussed with the patient. The patient verbalized understanding of and has agreed to the management plan. Patient is aware to call the clinic if symptoms persist or worsen. Patient is aware when to return to the clinic for a follow-up visit. Patient educated on when it is appropriate to go to the emergency department.   Time call ended:  1151  I provided 11 minutes of  non face-to-face time during this encounter.    Gwenlyn Perking, FNP

## 2022-09-02 ENCOUNTER — Encounter: Payer: Self-pay | Admitting: Podiatry

## 2022-09-02 ENCOUNTER — Ambulatory Visit (INDEPENDENT_AMBULATORY_CARE_PROVIDER_SITE_OTHER): Payer: Medicare HMO

## 2022-09-02 ENCOUNTER — Ambulatory Visit: Payer: Medicare HMO | Admitting: Podiatry

## 2022-09-02 DIAGNOSIS — Z9889 Other specified postprocedural states: Secondary | ICD-10-CM

## 2022-09-03 NOTE — Progress Notes (Signed)
Subjective:   Patient ID: [name], female   DOB: 61 y.o.   MRN: [id number]   HPI Patient states doing well and the redness has really reduced from the previous visit when she had taken off her dressings   ROS      Objective:  Physical Exam  Neurovascular status intact negative Bevelyn Buckles' sign noted wound edges are well coapted there is no drainage noted there is a slight bit of redness on the proximal portion first metatarsal but light with no indications of pathology with good range of motion first MPJ     Assessment:  Patient is doing well post foot surgery right     Plan:  H&P x-ray reviewed advised on continued elevation compression immobilization and patient will be seen back to recheck again in 4 weeks and will continue with compression therapy.  If any redness were to occur any drainage any changes she is to let us know immediately  X-rays indicate the osteotomy is healing well fixation in place satisfactory resection head of fifth metatarsal

## 2022-09-09 ENCOUNTER — Encounter (INDEPENDENT_AMBULATORY_CARE_PROVIDER_SITE_OTHER): Payer: Medicare HMO

## 2022-09-09 ENCOUNTER — Telehealth: Payer: Self-pay

## 2022-09-09 DIAGNOSIS — I10 Essential (primary) hypertension: Secondary | ICD-10-CM

## 2022-09-09 DIAGNOSIS — E782 Mixed hyperlipidemia: Secondary | ICD-10-CM

## 2022-09-09 NOTE — Chronic Care Management (AMB) (Signed)
  Chronic Care Management   Outreach Note  09/09/2022 Name: ZOILA DITULLIO MRN: [id number] DOB: [birth year]  AMARIYA LISKEY is a 61 y.o. year old female who is a primary care patient of [name], DO. I reached out to [employer] by phone today in response to a referral sent by Ms. Shelly Coss Reising's primary care provider.  An unsuccessful telephone outreach was attempted today to contact the patient about Chronic Care Management needs.    Follow Up Plan: A HIPAA compliant phone message was left for the patient providing contact information and requesting a return call.  The care management team will reach out to the patient again over the next 7 days.  If patient returns call to provider office, please advise to call [place] * at [phone]*  Noreene Larsson, [place], [place] [address] Direct Dial: [phone] [email or web address]

## 2022-09-09 NOTE — Telephone Encounter (Signed)
   CCM RN Visit Note   09-09-2022 Name: [name] MRN: [id number]      DOB: 10-Jun-1961  Subjective: [name] is a 61 y.o. year old female who is a primary care patient of Dr. Lajuana Ripple. The patient was referred to the Chronic Care Management team for assistance with care management needs subsequent to provider initiation of CCM services and plan of care.      An unsuccessful telephone outreach was attempted today to contact the patient about Chronic Care Management needs.    Plan:A HIPAA compliant phone message was left for the patient providing contact information and requesting a return call.  Noreene Larsson RN, MSN, CCM RN Care Manager  Chronic Care Management Direct Number: [phone]

## 2022-09-09 NOTE — Chronic Care Management (AMB) (Signed)
Error

## 2022-09-09 NOTE — Progress Notes (Deleted)
Cardiology Office Note:    Date:  09/09/2022   ID:  [name], DOB 1961/07/07, MRN [id number]  PCP:  [name], DO  [facility] Providers Cardiologist:  Carlyle Dolly, MD { Click to update primary MD,subspecialty MD or APP then REFRESH:1}    Referring MD: [name], DO   Chief Complaint:  No chief complaint on file. {Click here for Visit Info    :1}    History of Present Illness:   [name] is a 61 y.o. female with  history of HTN, syncope with orthostatic hypotension in setting of dehydration and AKI, chest pain and abnormal EKG with normal NST 2018, HLD.    Patient added onto my schedule with elevated BP's. Went to ED 07/08/22 with BP 170/100. Ran out of hydralazine 25 mg tid and wasn't taking HCTZ consistently. Had Chest pain in ED.Marland Kitchen She missed this appt.   She saw Christen Bame 08/04/22 for preop clearance.  Reports BP has been elevated and she is having chest pain. Realized her atenolol was not in her pill box, so she did not take it for several week. Chest pain gets worse with walking and also worse when BP is elevated, is accompanied by nausea, no dyspnea/ Lexiscan 08/10/22 was normal so she was cleared for foot surgery.    Past Medical History:  Diagnosis Date   Abdominal pain    Abscess, intra-abdominal, postoperative 12/31/2020   Acute blood loss anemia ([facility]LA) 05/04/2021   Acute ITP ([place]) 05/08/2021   Allergy    seasonal   Anemia 2021   Anxiety    Arthritis    Aspiration pneumonia of both lower lobes due to gastric secretions ([place]) 12/22/2020   Blood in stool    Blood transfusion without reported diagnosis 12/21/2020   Breast changes, fibrocystic    Bruises easily    Cardiomegaly 2018   Clotting disorder ([place])     itp , none since 1976, no current hematologist   Colon perforation due to stercoral ulcer with E. coli fecal peritonitis    Constipation    DDD (degenerative disc disease), cervical    with lumbar issues    Depression    E. coli sepsis -due to fecal peritonitis 12/23/2020   Generalized headaches    GERD (gastroesophageal reflux disease)    Glaucoma    Hemorrhoid    Hyperlipidemia    Hypertension    Idiopathic thrombocytopenia ([place])    as child   Leg swelling    both ankles   Liver lesion    Nasal congestion    Nausea & vomiting    Neuromuscular disorder (H[facility])    back  injury   Neuromuscular disorder ([place])    3 neck surgeries,neck fusion.pin,plates,screws   Osteoporosis    Panic attacks    pt also  has germaphobia   Rectal bleeding    Seizures ([place]) 1998   stress induced, one time, none since, no seizure meds   Seizures ([place])    Stated she had 2 weeks ago,it was like a transient,stare.04/01/21 pt. denies   Spleen enlarged 1976, none since   r/t idiopathic thrombocytopenia   Stercoral ulcer of large intestine 12/21/2020   Trouble swallowing    Unintentional weight loss    Weakness    weakness varies   Current Medications: No outpatient medications have been marked as taking for the 09/21/22 encounter (Appointment) with Imogene Burn, PA-C.    Allergies:   Nsaids   Social History  Tobacco Use   Smoking status: Every Day    Packs/day: 1.00    Years: 10.00    Total pack years: 10.00    Types: Cigarettes   Smokeless tobacco: Never   Tobacco comments:    Started 2007  Vaping Use   Vaping Use: Former   Quit date: 09/07/2016   Devices: only used for about a month  Substance Use Topics   Alcohol use: No   Drug use: Yes    Types: Marijuana    Family Hx: The patient's family history includes Alcohol abuse in her father; Anxiety disorder in her mother and sister; Arthritis in her paternal uncle; Bipolar disorder in her sister; Cancer in her father and paternal grandmother; Colon cancer in her paternal grandmother; Depression in her mother and sister; Diabetes in her sister; Heart disease in her maternal grandmother, mother, and sister; Hypertension in her mother and  sister; Lung cancer in her father. There is no history of Esophageal cancer, Rectal cancer, Stomach cancer, or Breast cancer.  ROS     Physical Exam:    VS:  There were no vitals taken for this visit.    Wt Readings from Last 3 Encounters:  08/17/22 190 lb (86.2 kg)  08/10/22 186 lb (84.4 kg)  08/04/22 186 lb (84.4 kg)    Physical Exam  GEN: Well nourished, well developed, in no acute distress  HEENT: normal  Neck: no JVD, carotid bruits, or masses Cardiac:RRR; no murmurs, rubs, or gallops  Respiratory:  clear to auscultation bilaterally, normal work of breathing GI: soft, nontender, nondistended, + BS Ext: without cyanosis, clubbing, or edema, Good distal pulses bilaterally MS: no deformity or atrophy  Skin: warm and dry, no rash Neuro:  Alert and Oriented x 3, Strength and sensation are intact Psych: euthymic mood, full affect        EKGs/Labs/Other Test Reviewed:    EKG:  EKG is *** ordered today.  The ekg ordered today demonstrates ***  Recent Labs: 07/07/2022: Hemoglobin 16.0; Platelets 235 08/10/2022: ALT 39; BUN 18; Creatinine, Ser 0.68; Potassium 4.4; Sodium 140   Recent Lipid Panel Recent Labs    08/10/22 0737  CHOL 201*  TRIG 330*  HDL 43  LDLCALC 102*     Prior CV Studies: {Select studies to display:26339}  Lexiscan 08/10/22   Findings are consistent with no prior ischemia. The study is low risk.   No ST deviation was noted.   LV perfusion is normal.   Left ventricular function is normal. Nuclear stress EF: 67 %. The left ventricular ejection fraction is hyperdynamic (>65%). End diastolic cavity size is normal. End systolic cavity size is normal.   Prior study available for comparison from 08/05/2017. No changes compared to prior study. No ischemia. LVEF 70%   No reversible ischemia. LVEF 67% with normal wall motion. This is a low risk study. No change compared to prior study in 2018.Nuclear stress test 08/05/2017   Nonspecific T wave flattening with  stress. The study is normal. No myocardial ischemia or scar. This is a low risk study. Nuclear stress EF: 70%.   Echo 07/01/2017   Left ventricle: The cavity size was normal. Wall thickness was    increased in a pattern of mild LVH. Systolic function was normal.    The estimated ejection fraction was in the range of 60% to 65%.    Wall motion was normal; there were no regional wall motion    abnormalities. Features are consistent with a pseudonormal left  ventricular filling pattern, with concomitant abnormal relaxation    and increased filling pressure (grade 2 diastolic dysfunction).    Indeterminate filling pressures.  - Aortic valve: There was mild regurgitation.  - Mitral valve: There was mild regurgitation.  - Systemic veins: Dilated IVC with normal respiratory variation.    Estimated CVP 8 mmHg.      Risk Assessment/Calculations/Metrics:   {Does this patient have ATRIAL FIBRILLATION?[phone]}     No BP recorded.  {Refresh Note OR Click here to enter BP  :1}***    ASSESSMENT & PLAN:   No problem-specific Assessment & Plan notes found for this encounter.   Chest pain with normal lexiscan 08/10/22-cleared for surgery  HTN  Aortic atherosclerosis on CT  HLD  Tobacco abuse      {Are you ordering a CV Procedure (e.g. stress test, cath, DCCV, TEE, etc)?   Press F2        :[id number]}   Dispo:  No follow-ups on file.   Medication Adjustments/Labs and Tests Ordered: Current medicines are reviewed at length with the patient today.  Concerns regarding medicines are outlined above.  Tests Ordered: No orders of the defined types were placed in this encounter.  Medication Changes: No orders of the defined types were placed in this encounter.  Signed, Ermalinda Barrios, PA-C  09/09/2022 10:21 AM    [address], [place], [place]  [address] Phone: [phone]; Fax: [phone]

## 2022-09-09 NOTE — Chronic Care Management (AMB) (Signed)
   CCM RN Visit Note   09-09-2022 Name: [name] MRN: [id number]      DOB: [birth year]  Subjective: [name] is a 61 y.o. year old female who is a primary care patient of Dr. Lajuana Ripple. The patient was referred to the Chronic Care Management team for assistance with care management needs subsequent to provider initiation of CCM services and plan of care.      An unsuccessful telephone outreach was attempted today to contact the patient about Chronic Care Management needs.    Plan:A HIPAA compliant phone message was left for the patient providing contact information and requesting a return call.  Noreene Larsson RN, MSN, CCM RN Care Manager  Chronic Care Management Direct Number: [phone]

## 2022-09-09 NOTE — Patient Instructions (Signed)
Error

## 2022-09-09 NOTE — Progress Notes (Signed)
This encounter was created in error - please disregard.

## 2022-09-11 DIAGNOSIS — M7918 Myalgia, other site: Secondary | ICD-10-CM | POA: Diagnosis not present

## 2022-09-11 DIAGNOSIS — G894 Chronic pain syndrome: Secondary | ICD-10-CM | POA: Diagnosis not present

## 2022-09-11 DIAGNOSIS — M47896 Other spondylosis, lumbar region: Secondary | ICD-10-CM | POA: Diagnosis not present

## 2022-09-14 NOTE — Progress Notes (Signed)
  Chronic Care Management   Note  09/22/2022 Name: [name] MRN: [id number] DOB: 06/18/1961  [name] is a 61 y.o. year old female who is a primary care patient of [name], DO. I reached out to [employer] by phone today in response to a referral sent by Ms. Shelly Coss Nadel's PCP.  Ms. [name]  agreedto scheduling an appointment with the CCM RN Case Manager   Follow up plan: Patient agreed to scheduled appointment with RN Case Manager on 10/06/2022 and Pharm D (date/time).   SIGNATURE Chronic Care Management   Note  09/14/2022 Name: [name] MRN: [id number] DOB: [birth year]  [name] is a 61 y.o. year old female who is a primary care patient of [name], DO. I reached out to [employer] by phone today in response to a referral sent by Ms. Shelly Coss San[place]'s PCP.  Ms. Shelly Coss Narang was not successfully contacted today. A HIPAA compliant voice message was left requesting a return call.   Follow up plan: Additional outreach attempts will be made.  Noreene Larsson, [place], [place] [address] Direct Dial: [phone] [email or web address]

## 2022-09-17 ENCOUNTER — Ambulatory Visit (HOSPITAL_COMMUNITY)
Admission: RE | Admit: 2022-09-17 | Discharge: 2022-09-17 | Disposition: A | Discharge status: Home / Self Care | Payer: Medicare HMO | Source: Ambulatory Visit | Attending: Urology | Admitting: Urology

## 2022-09-17 DIAGNOSIS — N281 Cyst of kidney, acquired: Secondary | ICD-10-CM | POA: Diagnosis not present

## 2022-09-17 DIAGNOSIS — N83201 Unspecified ovarian cyst, right side: Secondary | ICD-10-CM | POA: Diagnosis not present

## 2022-09-21 ENCOUNTER — Ambulatory Visit: Payer: Medicare HMO | Admitting: Physician Assistant

## 2022-09-30 ENCOUNTER — Other Ambulatory Visit: Payer: Medicare HMO

## 2022-10-06 ENCOUNTER — Telehealth: Payer: Medicare HMO

## 2022-10-06 ENCOUNTER — Telehealth: Payer: Self-pay | Admitting: *Deleted

## 2022-10-06 NOTE — Telephone Encounter (Signed)
   CCM RN Visit Note   10/06/22 Name: [name] MRN: [id number]      DOB: 1961/08/30  Subjective: [name] is a 61 y.o. year old female who is a primary care patient of Ronnie Doss. The patient was referred to the Chronic Care Management team for assistance with care management needs subsequent to provider initiation of CCM services and plan of care.      An unsuccessful telephone outreach was attempted today to contact the patient about Chronic Care Management needs.    Plan:Telephone follow up appointment with care management team member scheduled for:  upon care guide rescheduling.  Jacqlyn Larsen RNC, BSN RN Case Manager [place] [phone]

## 2022-10-08 ENCOUNTER — Telehealth: Payer: Self-pay | Admitting: Podiatry

## 2022-10-08 ENCOUNTER — Other Ambulatory Visit: Payer: Medicare HMO

## 2022-10-08 NOTE — Telephone Encounter (Signed)
Pt called and canceled her post op # 3 appt for today. I was going to reschedule to next week but pt stated she will have to call to r/s

## 2022-10-15 DIAGNOSIS — M7918 Myalgia, other site: Secondary | ICD-10-CM | POA: Diagnosis not present

## 2022-10-15 DIAGNOSIS — M47896 Other spondylosis, lumbar region: Secondary | ICD-10-CM | POA: Diagnosis not present

## 2022-10-15 DIAGNOSIS — G894 Chronic pain syndrome: Secondary | ICD-10-CM | POA: Diagnosis not present

## 2022-10-16 ENCOUNTER — Ambulatory Visit: Payer: Medicare HMO | Admitting: Urology

## 2022-10-27 ENCOUNTER — Ambulatory Visit (INDEPENDENT_AMBULATORY_CARE_PROVIDER_SITE_OTHER): Payer: Medicare HMO | Admitting: *Deleted

## 2022-10-27 ENCOUNTER — Other Ambulatory Visit: Payer: Self-pay | Admitting: Family Medicine

## 2022-10-27 DIAGNOSIS — E782 Mixed hyperlipidemia: Secondary | ICD-10-CM

## 2022-10-27 DIAGNOSIS — M609 Myositis, unspecified: Secondary | ICD-10-CM

## 2022-10-27 DIAGNOSIS — I700 Atherosclerosis of aorta: Secondary | ICD-10-CM

## 2022-10-27 DIAGNOSIS — I10 Essential (primary) hypertension: Secondary | ICD-10-CM

## 2022-10-27 MED ORDER — NEXLETOL 180 MG PO TABS: 180.0000 mg | ORAL_TABLET | Freq: Every day | ORAL | 3 refills | Status: DC

## 2022-10-27 NOTE — Plan of Care (Signed)
Chronic Care Management Provider Comprehensive Care Plan    10/27/2022 Name: [name] MRN: [id number] DOB: [birth year]  Referral to Chronic Care Management (CCM) services was placed by Provider:  Ronnie Doss DO on Date: 08/18/22.  Chronic Condition 1: HYPERTENSION Provider Assessment and Plan Essential hypertension - Plan: atenolol (TENORMIN) 50 MG tablet, hydrochlorothiazide (HYDRODIURIL) 25 MG tablet, lisinopril (ZESTRIL) 10 MG tablet    Expected Outcome/Goals Addressed This Visit (Provider CCM goals/Provider Assessment and plan  CCM (HYPERTENSION) EXPECTED OUTCOME: MONITOR, SELF-MANAGE AND REDUCE SYMPTOMS OF HYPERTENSION  Symptom Management Condition 1: Take medications as prescribed   Attend all scheduled provider appointments Call pharmacy for medication refills 3-7 days in advance of running out of medications Attend church or other social activities Perform all self care activities independently  Perform IADL's (shopping, preparing meals, housekeeping, managing finances) independently Call provider office for new concerns or questions  check blood pressure 3 times per week choose a place to take my blood pressure (home, clinic or office, retail store) write blood pressure results in a log or diary learn about high blood pressure keep a blood pressure log take blood pressure log to all doctor appointments call doctor for signs and symptoms of high blood pressure develop an action plan for high blood pressure keep all doctor appointments take medications for blood pressure exactly as prescribed report new symptoms to your doctor eat more whole grains, fruits and vegetables, lean meats and healthy fats Look over education sent via my chart- low sodium diet  Chronic Condition 2: HYPERLIPIDEMIA Provider Assessment and Plan  Hyperlipidemia associated with aortic atherosclerosis Patient was advised to resume Crestor 20 mg daily but never did get the call from her  cardiology clinic with her lipid results.  She notes that she cannot tolerate statins and does not wish to go back on them.  She would rather take the risk of stroke and heart attack.  She is an active every day smoker.  She would be willing however to try something that was not on statin in nature   Expected Outcome/Goals Addressed This Visit (Provider CCM goals/Provider Assessment and plan  CCM (HYPERLIPIDEMIA) EXPECTED OUTCOME: MONITOR, SELF-MANAGE AND REDUCE SYMPTOMS OF HYPERLIPIDEMIA  Symptom Management Condition 2: Take medications as prescribed   Attend all scheduled provider appointments Call pharmacy for medication refills 3-7 days in advance of running out of medications Attend church or other social activities Perform all self care activities independently  Perform IADL's (shopping, preparing meals, housekeeping, managing finances) independently Call provider office for new concerns or questions  - call for medicine refill 2 or 3 days before it runs out - take all medications exactly as prescribed - call doctor with any symptoms you believe are related to your medicine - call doctor when you experience any new symptoms - go to all doctor appointments as scheduled - adhere to prescribed diet: heart healthy Check with your pharmacy to see if nexlotol was called in Look over education sent via my chart- heart healthy diet  Problem List Patient Active Problem List   Diagnosis Date Noted   Incisional hernia, without obstruction or gangrene 10/29/2021   Ventral hernia without obstruction or gangrene 07/24/2021   Elevated alkaline phosphatase level 05/08/2021   History of colostomy reversal 05/08/2021   Acute Thrombocytopenia - HIT negative 05/04/2021   History of ITP 05/04/2021   Anxiety 05/04/2021   Hx of fusion of cervical spine 05/04/2021   Low vitamin D level 05/04/2021   Scoliosis 05/04/2021  Lumbar disc disorder with myelopathy 05/04/2021   Spinal stenosis 05/04/2021    Parastomal hernia without obstruction or gangrene 04/18/2021   Colostomy in place [facility]) 01/09/2021   Under care of pain management specialist 05/30/2020   Benzodiazepine dependence ([place]) 08/08/2018   Controlled substance agreement broken 08/08/2018   Hyperlipidemia 08/05/2017   Chest pain 06/30/2017   Hypokalemia 07/09/2016   Obesity (BMI 30-39.9) 07/02/2016   Osteopenia 12/20/2013   Constipation 02/04/2012   Diverticulosis of colon (without mention of hemorrhage) 02/04/2012   GERD (gastroesophageal reflux disease) 02/04/2012   Dysphagia 02/04/2012   Unspecified gastritis and gastroduodenitis without mention of hemorrhage 02/04/2012   Cystic disease of liver 01/26/2012   SYNCOPE 12/20/2007   GAD (generalized anxiety disorder) 01/25/2007   Depression 01/25/2007   Essential hypertension 01/25/2007   BREAST MASSES, BILATERAL 01/25/2007   DEGENERATIVE DISC DISEASE, CERVICAL SPINE 01/25/2007   LOW BACK PAIN 01/25/2007   Insomnia 01/25/2007   DEPENDENT EDEMA, LEGS, BILATERAL 01/25/2007   Smoker 01/25/2007   TAH/BSO, HX OF 01/25/2007    Medication Management  Current Outpatient Medications:    albuterol (VENTOLIN HFA) 108 (90 Base) MCG/ACT inhaler, Inhale 2 puffs into the lungs every 6 (six) hours as needed for wheezing or shortness of breath., Disp: 8 g, Rfl: 0   amitriptyline (ELAVIL) 25 MG tablet, Take 25-50 mg by mouth See admin instructions. Take 50 mg by mouth at bedtime, may take 25-50 mg during the day if needed for pain, Disp: , Rfl:    atenolol (TENORMIN) 50 MG tablet, Take 1 tablet (50 mg total) by mouth 2 (two) times daily., Disp: 180 tablet, Rfl: 3   Bempedoic Acid (NEXLETOL) 180 MG TABS, Take 180 mg by mouth daily., Disp: 90 tablet, Rfl: 3   busPIRone (BUSPAR) 15 MG tablet, TAKE 1 TABLET BY MOUTH 3 TIMES DAILY., Disp: 270 tablet, Rfl: 2   busPIRone (BUSPAR) 5 MG tablet, TAKE 1 TABLET BY MOUTH 3 TIMES DAILY. TAKE 1 TABLET WITH THE '15MG'$  TO EQUAL '20MG'$ ., Disp: 270 tablet,  Rfl: 3   cetirizine (ZYRTEC) 10 MG tablet, Take 10 mg by mouth daily., Disp: , Rfl:    citalopram (CELEXA) 40 MG tablet, Take 1 tablet daily for mood, Disp: 90 tablet, Rfl: 3   famotidine (PEPCID) 20 MG tablet, Take 1 tablet (20 mg total) by mouth daily as needed for heartburn or indigestion., Disp: 30 tablet, Rfl:    fluticasone (FLONASE) 50 MCG/ACT nasal spray, Place 2 sprays into both nostrils daily., Disp: 16 g, Rfl: 6   hydrALAZINE (APRESOLINE) 50 MG tablet, Take 1 tablet (50 mg total) by mouth in the morning and at bedtime., Disp: 180 tablet, Rfl: 3   hydrochlorothiazide (HYDRODIURIL) 25 MG tablet, Take 1 tablet (25 mg total) by mouth daily., Disp: 90 tablet, Rfl: 3   latanoprost (XALATAN) 0.005 % ophthalmic solution, Place 1 drop into both eyes at bedtime., Disp: , Rfl:    linaclotide (LINZESS) 290 MCG CAPS capsule, Take 1 capsule (290 mcg total) by mouth daily before breakfast., Disp: 90 capsule, Rfl: 3   lisinopril (ZESTRIL) 10 MG tablet, Take 0.5-1 tablets (5-10 mg total) by mouth daily. As directed, Disp: 90 tablet, Rfl: 3   ondansetron (ZOFRAN) 4 MG tablet, Take 4 mg by mouth every 8 (eight) hours as needed for nausea., Disp: , Rfl:    ondansetron (ZOFRAN) 4 MG tablet, Take 1 tablet (4 mg total) by mouth every 8 (eight) hours as needed for nausea or vomiting., Disp: 20 tablet, Rfl: 0  Oxycodone HCl 10 MG TABS, Take by mouth., Disp: , Rfl:    polyethylene glycol powder (MIRALAX) 17 GM/SCOOP powder, Take 17 g by mouth in the morning and at bedtime., Disp: 225 g, Rfl: PRN   tiZANidine (ZANAFLEX) 4 MG tablet, Take 1 tablet (4 mg total) by mouth every 8 (eight) hours as needed for muscle spasms., Disp: 30 tablet, Rfl: 1   traZODone (DESYREL) 100 MG tablet, Take 1.5 tablets (150 mg total) by mouth at bedtime as needed. for sleep, Disp: 135 tablet, Rfl: 3   doxycycline (VIBRA-TABS) 100 MG tablet, Take 1 tablet (100 mg total) by mouth 2 (two) times daily. (Patient not taking: Reported on  10/27/2022), Disp: 20 tablet, Rfl: 1   fluconazole (DIFLUCAN) 150 MG tablet, Take one tablet by mouth now. May repeat in 3 days if symptoms persist. (Patient not taking: Reported on 10/27/2022), Disp: 2 tablet, Rfl: 0   nitroGLYCERIN (NITROSTAT) 0.4 MG SL tablet, Place 1 tablet (0.4 mg total) under the tongue every 5 (five) minutes as needed for chest pain., Disp: 25 tablet, Rfl: 3   oxyCODONE-acetaminophen (PERCOCET) 10-325 MG tablet, Take 1 tablet by mouth every 4 (four) hours as needed for pain. (Patient not taking: Reported on 10/27/2022), Disp: 20 tablet, Rfl: 0  Cognitive Assessment Identity Confirmed: : Name; DOB Cognitive Status: Normal   Functional Assessment Hearing Difficulty or Deaf: no Wear Glasses or Blind: yes Vision Management: reading glasses Concentrating, Remembering or Making Decisions Difficulty (CP): no Difficulty Communicating: no Difficulty Eating/Swallowing: no Walking or Climbing Stairs Difficulty: no Dressing/Bathing Difficulty: no Doing Errands Independently Difficulty (such as shopping) (CP): no   Caregiver Assessment  Primary Source of Support/Comfort: spouse Name of Support/Comfort Primary Source: Shadell Brenn spouse People in Home: spouse Family Caregiver if Needed: none   Planned Interventions  Provider established cholesterol goals reviewed; Counseled on importance of regular laboratory monitoring as prescribed; Provided HLD educational materials; Reviewed role and benefits of statin for ASCVD risk reduction; Discussed strategies to manage statin-induced myalgias; Reviewed importance of limiting foods high in cholesterol; Screening for signs and symptoms of depression related to chronic disease state;  Assessed social determinant of health barriers;  In basket message sent to primary care provider with request prescription for nexletol be sent to [facility] in [place] Evaluation of current treatment plan related to hypertension self management  and patient's adherence to plan as established by provider;   Reviewed prescribed diet heart healthy, low sodium Reviewed medications with patient and discussed importance of compliance;  Counseled on the importance of exercise goals with target of 150 minutes per week Discussed plans with patient for ongoing care management follow up and provided patient with direct contact information for care management team; Advised patient, providing education and rationale, to monitor blood pressure daily and record, calling PCP for findings outside established parameters;  Provided education on prescribed diet low sodium;  Discussed complications of poorly controlled blood pressure such as heart disease, stroke, circulatory complications, vision complications, kidney impairment, sexual dysfunction;  Screening for signs and symptoms of depression related to chronic disease state;  Assessed social determinant of health barriers;   Interaction and coordination with outside resources, practitioners, and providers See CCM Referral  Care Plan: Available in MyChart

## 2022-10-27 NOTE — Patient Instructions (Signed)
Please call the care guide team at [phone] if you need to cancel or reschedule your appointment.   If you are experiencing a Mental Health or [place] or need someone to talk to, please call the Suicide and Crisis Lifeline: 988 call the [place] National Suicide Prevention Lifeline: 437-113-9776 or TTY: 763-119-5061 TTY 307-139-3421) to talk to a trained counselor call 1-800-273-TALK (toll free, 24 hour hotline) go to [facility] [address], [place] [phone]) call the [facility]: [phone] call 911   Following is a copy of the CCM Program Consent:  CCM service includes personalized support from designated clinical staff supervised by the physician, including individualized plan of care and coordination with other care providers 24/7 contact phone numbers for assistance for urgent and routine care needs. Service will only be billed when office clinical staff spend 20 minutes or more in a month to coordinate care. Only one practitioner may furnish and bill the service in a calendar month. The patient may stop CCM services at amy time (effective at the end of the month) by phone call to the office staff. The patient will be responsible for cost sharing (co-pay) or up to 20% of the service fee (after annual deductible is met)  Following is a copy of your full provider care plan:   Goals Addressed             This Visit's Progress    CCM (HYPERLIPIDEMIA) EXPECTED OUTCOME: MONITOR, SELF-MANAGE AND REDUCE SYMPTOMS OF HYPERLIPIDEMIA       Current Barriers:  Knowledge Deficits related to Hyperlipidemia management Chronic Disease Management support and education needs related to Hyperlipidemia and diet No Advanced Directives in place- pt declines Patient reports she tries to adhere to heart healthy diet Patient reports she was given samples for nexletol and is now out, requests that a new prescription be  sent to [facility] [place]  Planned Interventions: Provider established cholesterol goals reviewed; Counseled on importance of regular laboratory monitoring as prescribed; Provided HLD educational materials; Reviewed role and benefits of statin for ASCVD risk reduction; Discussed strategies to manage statin-induced myalgias; Reviewed importance of limiting foods high in cholesterol; Screening for signs and symptoms of depression related to chronic disease state;  Assessed social determinant of health barriers;  In basket message sent to primary care provider with request [facility] [place]  Symptom Management: Take medications as prescribed   Attend all scheduled provider appointments Call pharmacy for medication refills 3-7 days in advance of running out of medications Attend church or other social activities Perform all self care activities independently  Perform IADL's (shopping, preparing meals, housekeeping, managing finances) independently Call provider office for new concerns or questions  - call for medicine refill 2 or 3 days before it runs out - take all medications exactly as prescribed - call doctor with any symptoms you believe are related to your medicine - call doctor when you experience any new symptoms - go to all doctor appointments as scheduled - adhere to prescribed diet: heart healthy Check with your pharmacy to see if nexlotol was called in Look over education sent via my chart- heart healthy diet  Follow Up Plan: Telephone follow up appointment with care management team member scheduled for:  12/29/22       CCM (HYPERTENSION) EXPECTED OUTCOME: MONITOR, SELF-MANAGE AND REDUCE SYMPTOMS OF HYPERTENSION       Current Barriers:  Knowledge Deficits related to Hypertension management Chronic  Disease Management support and education needs related to Hypertensions and diet No Advanced Directives in place- pt declines Patient reports she  lives with spouse, is independent in all apsects of her care, is unable to exercise due to arthritis/ chronic pain issues. Patient states she checks blood pressure "when I feel it might be high" Patient reports she tries to adhere to heart healthy diet  Planned Interventions: Evaluation of current treatment plan related to hypertension self management and patient's adherence to plan as established by provider;   Reviewed prescribed diet heart healthy, low sodium Reviewed medications with patient and discussed importance of compliance;  Counseled on the importance of exercise goals with target of 150 minutes per week Discussed plans with patient for ongoing care management follow up and provided patient with direct contact information for care management team; Advised patient, providing education and rationale, to monitor blood pressure daily and record, calling PCP for findings outside established parameters;  Provided education on prescribed diet low sodium;  Discussed complications of poorly controlled blood pressure such as heart disease, stroke, circulatory complications, vision complications, kidney impairment, sexual dysfunction;  Screening for signs and symptoms of depression related to chronic disease state;  Assessed social determinant of health barriers;   Symptom Management: Take medications as prescribed   Attend all scheduled provider appointments Call pharmacy for medication refills 3-7 days in advance of running out of medications Attend church or other social activities Perform all self care activities independently  Perform IADL's (shopping, preparing meals, housekeeping, managing finances) independently Call provider office for new concerns or questions  check blood pressure 3 times per week choose a place to take my blood pressure (home, clinic or office, retail store) write blood pressure results in a log or diary learn about high blood pressure keep a blood pressure  log take blood pressure log to all doctor appointments call doctor for signs and symptoms of high blood pressure develop an action plan for high blood pressure keep all doctor appointments take medications for blood pressure exactly as prescribed report new symptoms to your doctor eat more whole grains, fruits and vegetables, lean meats and healthy fats Look over education sent via my chart- low sodium diet  Follow Up Plan: Telephone follow up appointment with care management team member scheduled for:  12/29/22 at 130 pm          Patient verbalizes understanding of instructions and care plan provided today and agrees to view in [place]. Active MyChart status and patient understanding of how to access instructions and care plan via MyChart confirmed with patient.     Telephone follow up appointment with care management team member scheduled for:  12/29/22 at 130 pm  Heart-Healthy Eating Plan Eating a healthy diet is important for the health of your heart. A heart-healthy eating plan includes: Eating less unhealthy fats. Eating more healthy fats. Eating less salt in your food. Salt is also called sodium. Making other changes in your diet. Talk with your doctor or a diet specialist (dietitian) to create an eating plan that is right for you. What is my plan? Your doctor may recommend an eating plan that includes: Total fat: ______% or less of total calories a day. Saturated fat: ______% or less of total calories a day. Cholesterol: less than _________mg a day. Sodium: less than _________mg a day. What are tips for following this plan? Cooking Avoid frying your food. Try to bake, boil, grill, or broil it instead. You can also reduce fat by: Removing  the skin from poultry. Removing all visible fats from meats. Steaming vegetables in water or broth. Meal planning  At meals, divide your plate into four equal parts: Fill one-half of your plate with vegetables and green salads. Fill  one-fourth of your plate with whole grains. Fill one-fourth of your plate with lean protein foods. Eat 2-4 cups of vegetables per day. One cup of vegetables is: 1 cup (91 g) broccoli or cauliflower florets. 2 medium carrots. 1 large bell pepper. 1 large sweet potato. 1 large tomato. 1 medium white potato. 2 cups (150 g) raw leafy greens. Eat 1-2 cups of fruit per day. One cup of fruit is: 1 small apple 1 large banana 1 cup (237 g) mixed fruit, 1 large orange,  cup (82 g) dried fruit, 1 cup (240 mL) 100% fruit juice. Eat more foods that have soluble fiber. These are apples, broccoli, carrots, beans, peas, and barley. Try to get 20-30 g of fiber per day. Eat 4-5 servings of nuts, legumes, and seeds per week: 1 serving of dried beans or legumes equals  cup (90 g) cooked. 1 serving of nuts is  oz (12 almonds, 24 pistachios, or 7 walnut halves). 1 serving of seeds equals  oz (8 g). General information Eat more home-cooked food. Eat less restaurant, buffet, and fast food. Limit or avoid alcohol. Limit foods that are high in starch and sugar. Avoid fried foods. Lose weight if you are overweight. Keep track of how much salt (sodium) you eat. This is important if you have high blood pressure. Ask your doctor to tell you more about this. Try to add vegetarian meals each week. Fats Choose healthy fats. These include olive oil and canola oil, flaxseeds, walnuts, almonds, and seeds. Eat more omega-3 fats. These include salmon, mackerel, sardines, tuna, flaxseed oil, and ground flaxseeds. Try to eat fish at least 2 times each week. Check food labels. Avoid foods with trans fats or high amounts of saturated fat. Limit saturated fats. These are often found in animal products, such as meats, butter, and cream. These are also found in plant foods, such as palm oil, palm kernel oil, and coconut oil. Avoid foods with partially hydrogenated oils in them. These have trans fats. Examples are  stick margarine, some tub margarines, cookies, crackers, and other baked goods. What foods should I eat? Fruits All fresh, canned (in natural juice), or frozen fruits. Vegetables Fresh or frozen vegetables (raw, steamed, roasted, or grilled). Green salads. Grains Most grains. Choose whole wheat and whole grains most of the time. Rice and pasta, including brown rice and pastas made with whole wheat. Meats and other proteins Lean, well-trimmed beef, veal, pork, and lamb. Chicken and [place] without skin. All fish and shellfish. Wild duck, rabbit, pheasant, and venison. Egg whites or low-cholesterol egg substitutes. Dried beans, peas, lentils, and tofu. Seeds and most nuts. Dairy Low-fat or nonfat cheeses, including ricotta and mozzarella. Skim or 1% milk that is liquid, powdered, or evaporated. Buttermilk that is made with low-fat milk. Nonfat or low-fat yogurt. Fats and oils Non-hydrogenated (trans-free) margarines. Vegetable oils, including soybean, sesame, sunflower, olive, peanut, safflower, corn, canola, and cottonseed. Salad dressings or mayonnaise made with a vegetable oil. Beverages Mineral water. Coffee and tea. Diet carbonated beverages. Sweets and desserts Sherbet, gelatin, and fruit ice. Small amounts of dark chocolate. Limit all sweets and desserts. Seasonings and condiments All seasonings and condiments. The items listed above may not be a complete list of foods and drinks you can eat. Contact  a dietitian for more options. What foods should I avoid? Fruits Canned fruit in heavy syrup. Fruit in cream or butter sauce. Fried fruit. Limit coconut. Vegetables Vegetables cooked in cheese, cream, or butter sauce. Fried vegetables. Grains Breads that are made with saturated or trans fats, oils, or whole milk. Croissants. Sweet rolls. Donuts. High-fat crackers, such as cheese crackers. Meats and other proteins Fatty meats, such as hot dogs, ribs, sausage, bacon, rib-eye roast or  steak. High-fat deli meats, such as salami and bologna. Caviar. Domestic duck and goose. Organ meats, such as liver. Dairy Cream, sour cream, cream cheese, and creamed cottage cheese. Whole-milk cheeses. Whole or 2% milk that is liquid, evaporated, or condensed. Whole buttermilk. Cream sauce or high-fat cheese sauce. Yogurt that is made from whole milk. Fats and oils Meat fat, or shortening. Cocoa butter, hydrogenated oils, palm oil, coconut oil, palm kernel oil. Solid fats and shortenings, including bacon fat, salt pork, lard, and butter. Nondairy cream substitutes. Salad dressings with cheese or sour cream. Beverages Regular sodas and juice drinks with added sugar. Sweets and desserts Frosting. Pudding. Cookies. Cakes. Pies. Milk chocolate or white chocolate. Buttered syrups. Full-fat ice cream or ice cream drinks. The items listed above may not be a complete list of foods and drinks to avoid. Contact a dietitian for more information. Summary Heart-healthy meal planning includes eating less unhealthy fats, eating more healthy fats, and making other changes in your diet. Eat a balanced diet. This includes fruits and vegetables, low-fat or nonfat dairy, lean protein, nuts and legumes, whole grains, and heart-healthy oils and fats. This information is not intended to replace advice given to you by your health care provider. Make sure you discuss any questions you have with your health care provider. Document Revised: 12/01/2021 Document Reviewed: 12/01/2021 Elsevier Patient Education  [place]. Low-Sodium Eating Plan Sodium, which is an element that makes up salt, helps you maintain a healthy balance of fluids in your body. Too much sodium can increase your blood pressure and cause fluid and waste to be held in your body. Your health care provider or dietitian may recommend following this plan if you have high blood pressure (hypertension), kidney disease, liver disease, or heart failure.  Eating less sodium can help lower your blood pressure, reduce swelling, and protect your heart, liver, and kidneys. What are tips for following this plan? Reading food labels The Nutrition Facts label lists the amount of sodium in one serving of the food. If you eat more than one serving, you must multiply the listed amount of sodium by the number of servings. Choose foods with less than 140 mg of sodium per serving. Avoid foods with 300 mg of sodium or more per serving. Shopping  Look for lower-sodium products, often labeled as "low-sodium" or "no salt added." Always check the sodium content, even if foods are labeled as "unsalted" or "no salt added." Buy fresh foods. Avoid canned foods and pre-made or frozen meals. Avoid canned, cured, or processed meats. Buy breads that have less than 80 mg of sodium per slice. Cooking  Eat more home-cooked food and less restaurant, buffet, and fast food. Avoid adding salt when cooking. Use salt-free seasonings or herbs instead of table salt or sea salt. Check with your health care provider or pharmacist before using salt substitutes. Cook with plant-based oils, such as canola, sunflower, or olive oil. Meal planning When eating at a restaurant, ask that your food be prepared with less salt or no salt, if possible.  Avoid dishes labeled as brined, pickled, cured, smoked, or made with soy sauce, miso, or teriyaki sauce. Avoid foods that contain MSG (monosodium glutamate). MSG is sometimes added to [place] food, bouillon, and some canned foods. Make meals that can be grilled, baked, poached, roasted, or steamed. These are generally made with less sodium. General information Most people on this plan should limit their sodium intake to 1,500-2,000 mg (milligrams) of sodium each day. What foods should I eat? Fruits Fresh, frozen, or canned fruit. Fruit juice. Vegetables Fresh or frozen vegetables. "No salt added" canned vegetables. "No salt added" tomato  sauce and paste. Low-sodium or reduced-sodium tomato and vegetable juice. Grains Low-sodium cereals, including oats, puffed wheat and rice, and shredded wheat. Low-sodium crackers. Unsalted rice. Unsalted pasta. Low-sodium bread. Whole-grain breads and whole-grain pasta. Meats and other proteins Fresh or frozen (no salt added) meat, poultry, seafood, and fish. Low-sodium canned tuna and salmon. Unsalted nuts. Dried peas, beans, and lentils without added salt. Unsalted canned beans. Eggs. Unsalted nut butters. Dairy Milk. Soy milk. Cheese that is naturally low in sodium, such as ricotta cheese, fresh mozzarella, or Swiss cheese. Low-sodium or reduced-sodium cheese. Cream cheese. Yogurt. Seasonings and condiments Fresh and dried herbs and spices. Salt-free seasonings. Low-sodium mustard and ketchup. Sodium-free salad dressing. Sodium-free light mayonnaise. Fresh or refrigerated horseradish. Lemon juice. Vinegar. Other foods Homemade, reduced-sodium, or low-sodium soups. Unsalted popcorn and pretzels. Low-salt or salt-free chips. The items listed above may not be a complete list of foods and beverages you can eat. Contact a dietitian for more information. What foods should I avoid? Vegetables Sauerkraut, pickled vegetables, and relishes. Olives. [place] fries. Onion rings. Regular canned vegetables (not low-sodium or reduced-sodium). Regular canned tomato sauce and paste (not low-sodium or reduced-sodium). Regular tomato and vegetable juice (not low-sodium or reduced-sodium). Frozen vegetables in sauces. Grains Instant hot cereals. Bread stuffing, pancake, and biscuit mixes. Croutons. Seasoned rice or pasta mixes. Noodle soup cups. Boxed or frozen macaroni and cheese. Regular salted crackers. Self-rising flour. Meats and other proteins Meat or fish that is salted, canned, smoked, spiced, or pickled. Precooked or cured meat, such as sausages or meat loaves. [place] Salines. Ham. Pepperoni. Hot dogs. Corned beef.  Chipped beef. Salt pork. Jerky. Pickled herring. Anchovies and sardines. Regular canned tuna. Salted nuts. Dairy Processed cheese and cheese spreads. Hard cheeses. Cheese curds. Blue cheese. Feta cheese. String cheese. Regular cottage cheese. Buttermilk. Canned milk. Fats and oils Salted butter. Regular margarine. Ghee. Bacon fat. Seasonings and condiments Onion salt, garlic salt, seasoned salt, table salt, and sea salt. Canned and packaged gravies. Worcestershire sauce. Tartar sauce. Barbecue sauce. Teriyaki sauce. Soy sauce, including reduced-sodium. Steak sauce. Fish sauce. Oyster sauce. Cocktail sauce. Horseradish that you find on the shelf. Regular ketchup and mustard. Meat flavorings and tenderizers. Bouillon cubes. Hot sauce. Pre-made or packaged marinades. Pre-made or packaged taco seasonings. Relishes. Regular salad dressings. Salsa. Other foods Salted popcorn and pretzels. Corn chips and puffs. Potato and tortilla chips. Canned or dried soups. Pizza. Frozen entrees and pot pies. The items listed above may not be a complete list of foods and beverages you should avoid. Contact a dietitian for more information. Summary Eating less sodium can help lower your blood pressure, reduce swelling, and protect your heart, liver, and kidneys. Most people on this plan should limit their sodium intake to 1,500-2,000 mg (milligrams) of sodium each day. Canned, boxed, and frozen foods are high in sodium. Restaurant foods, fast foods, and pizza are also very high in sodium. You  also get sodium by adding salt to food. Try to cook at home, eat more fresh fruits and vegetables, and eat less fast food and canned, processed, or prepared foods. This information is not intended to replace advice given to you by your health care provider. Make sure you discuss any questions you have with your health care provider. Document Revised: 12/01/2019 Document Reviewed: 09/27/2019 Elsevier Patient Education  [place].

## 2022-10-27 NOTE — Chronic Care Management (AMB) (Signed)
Chronic Care Management   CCM RN Visit Note  10/27/2022 Name: [name] MRN: [id number] DOB: [birth year]  Subjective: [name] is a 61 y.o. year old female who is a primary care patient of [name], DO. The patient was referred to the Chronic Care Management team for assistance with care management needs subsequent to provider initiation of CCM services and plan of care.    Today's Visit:  Engaged with patient by telephone for initial visit.     SDOH Interventions Today    Flowsheet Row Most Recent Value  SDOH Interventions   Food Insecurity Interventions Patient Refused  [pt states she is already familiar with food banks, [address]]  Housing Interventions Intervention Not Indicated  Transportation Interventions Intervention Not Indicated  Utilities Interventions Intervention Not Indicated  Financial Strain Interventions Intervention Not Indicated  Physical Activity Interventions Patient Refused  Stress Interventions Intervention Not Indicated  Social Connections Interventions Patient Refused         Goals Addressed             This Visit's Progress    CCM (HYPERLIPIDEMIA) EXPECTED OUTCOME: MONITOR, SELF-MANAGE AND REDUCE SYMPTOMS OF HYPERLIPIDEMIA       Current Barriers:  Knowledge Deficits related to Hyperlipidemia management Chronic Disease Management support and education needs related to Hyperlipidemia and diet No Advanced Directives in place- pt declines Patient reports she tries to adhere to heart healthy diet Patient reports she was given samples for nexletol and is now out, requests that a new prescription be sent to [facility] [place]  Planned Interventions: Provider established cholesterol goals reviewed; Counseled on importance of regular laboratory monitoring as prescribed; Provided HLD educational materials; Reviewed role and benefits of statin for ASCVD risk reduction; Discussed strategies to manage statin-induced myalgias; Reviewed  importance of limiting foods high in cholesterol; Screening for signs and symptoms of depression related to chronic disease state;  Assessed social determinant of health barriers;  In basket message sent to primary care provider with request [facility] [place]  Symptom Management: Take medications as prescribed   Attend all scheduled provider appointments Call pharmacy for medication refills 3-7 days in advance of running out of medications Attend church or other social activities Perform all self care activities independently  Perform IADL's (shopping, preparing meals, housekeeping, managing finances) independently Call provider office for new concerns or questions  - call for medicine refill 2 or 3 days before it runs out - take all medications exactly as prescribed - call doctor with any symptoms you believe are related to your medicine - call doctor when you experience any new symptoms - go to all doctor appointments as scheduled - adhere to prescribed diet: heart healthy Check with your pharmacy to see if nexlotol was called in Look over education sent via my chart- heart healthy diet  Follow Up Plan: Telephone follow up appointment with care management team member scheduled for:  12/29/22       CCM (HYPERTENSION) EXPECTED OUTCOME: MONITOR, SELF-MANAGE AND REDUCE SYMPTOMS OF HYPERTENSION       Current Barriers:  Knowledge Deficits related to Hypertension management Chronic Disease Management support and education needs related to Hypertensions and diet No Advanced Directives in place- pt declines Patient reports she lives with spouse, is independent in all apsects of her care, is unable to exercise due to arthritis/ chronic pain issues. Patient states she checks blood pressure "when I feel it might be high" Patient reports she tries to  adhere to heart healthy diet  Planned Interventions: Evaluation of current treatment plan related to  hypertension self management and patient's adherence to plan as established by provider;   Reviewed prescribed diet heart healthy, low sodium Reviewed medications with patient and discussed importance of compliance;  Counseled on the importance of exercise goals with target of 150 minutes per week Discussed plans with patient for ongoing care management follow up and provided patient with direct contact information for care management team; Advised patient, providing education and rationale, to monitor blood pressure daily and record, calling PCP for findings outside established parameters;  Provided education on prescribed diet low sodium;  Discussed complications of poorly controlled blood pressure such as heart disease, stroke, circulatory complications, vision complications, kidney impairment, sexual dysfunction;  Screening for signs and symptoms of depression related to chronic disease state;  Assessed social determinant of health barriers;   Symptom Management: Take medications as prescribed   Attend all scheduled provider appointments Call pharmacy for medication refills 3-7 days in advance of running out of medications Attend church or other social activities Perform all self care activities independently  Perform IADL's (shopping, preparing meals, housekeeping, managing finances) independently Call provider office for new concerns or questions  check blood pressure 3 times per week choose a place to take my blood pressure (home, clinic or office, retail store) write blood pressure results in a log or diary learn about high blood pressure keep a blood pressure log take blood pressure log to all doctor appointments call doctor for signs and symptoms of high blood pressure develop an action plan for high blood pressure keep all doctor appointments take medications for blood pressure exactly as prescribed report new symptoms to your doctor eat more whole grains, fruits and  vegetables, lean meats and healthy fats Look over education sent via my chart- low sodium diet  Follow Up Plan: Telephone follow up appointment with care management team member scheduled for:  12/29/22 at 130 pm          Plan:Telephone follow up appointment with care management team member scheduled for:  12/29/22 at 130 pm  Jacqlyn Garza [place], BSN RN Case Manager [place] [phone]

## 2022-10-28 ENCOUNTER — Ambulatory Visit: Payer: Self-pay | Admitting: *Deleted

## 2022-10-28 ENCOUNTER — Telehealth: Payer: Self-pay | Admitting: *Deleted

## 2022-10-28 DIAGNOSIS — I10 Essential (primary) hypertension: Secondary | ICD-10-CM

## 2022-10-28 DIAGNOSIS — E782 Mixed hyperlipidemia: Secondary | ICD-10-CM

## 2022-10-28 NOTE — Chronic Care Management (AMB) (Signed)
  Chronic Care Management   CCM RN Visit Note  10/28/2022 Name: KENNETH LAX MRN: [id number] DOB: [birth year]  Subjective: [name] is a 61 y.o. year old female who is a primary care patient of [name], DO. The patient was referred to the Chronic Care Management team for assistance with care management needs subsequent to provider initiation of CCM services and plan of care.    Today's Visit:  Engaged with patient by telephone for follow up visit.        Goals Addressed             This Visit's Progress    CCM (HYPERLIPIDEMIA) EXPECTED OUTCOME: MONITOR, SELF-MANAGE AND REDUCE SYMPTOMS OF HYPERLIPIDEMIA       Current Barriers:  Knowledge Deficits related to Hyperlipidemia management Chronic Disease Management support and education needs related to Hyperlipidemia and diet No Advanced Directives in place- pt declines Patient reports she tries to adhere to heart healthy diet Patient reports she was given samples for nexletol and is now out, requests that a new prescription be sent to [place] with pt on 10/28/22 to let her know prescription for [facility]let[facility] is being [facility] and per primary care provider insurance may not cover,  pt states she will check with pharmacy and if any issues with coverage/ costs, she will let RN care manager know or speak with pharmacist at scheduled appointment on 11/06/22.  Planned Interventions: Provider established cholesterol goals reviewed; Counseled on importance of regular laboratory monitoring as prescribed; Provided HLD educational materials; Reviewed role and benefits of statin for ASCVD risk reduction; Discussed strategies to manage statin-induced myalgias; Reviewed importance of limiting foods high in cholesterol; Screening for signs and symptoms of depression related to chronic disease state;  Assessed social determinant of health barriers;  RN care manager informed pt prescription for nexletol sent  to pharmacy and insurance may not cover, encouraged patient to speak with RN care manager or pharmacist for any issues  Symptom Management: Take medications as prescribed   Attend all scheduled provider appointments Call pharmacy for medication refills 3-7 days in advance of running out of medications Attend church or other social activities Perform all self care activities independently  Perform IADL's (shopping, preparing meals, housekeeping, managing finances) independently Call provider office for new concerns or questions  - call for medicine refill 2 or 3 days before it runs out - take all medications exactly as prescribed - call doctor with any symptoms you believe are related to your medicine - call doctor when you experience any new symptoms - go to all doctor appointments as scheduled - adhere to prescribed diet: heart healthy Check with your pharmacy to see if nexlotol was called in Call RN care manager if any issues with insurance coverage/ costs, you can also speak with pharmacist at scheduled appointment on 11/06/22 at 1030 am  Follow Up Plan: Telephone follow up appointment with care management team member scheduled for:  12/29/22 at 130 pm          Plan:Telephone follow up appointment with care management team member scheduled for:  12/29/22 at 130 pm  Jacqlyn Larsen [facility], BSN RN Case Manager [place] [phone]

## 2022-10-28 NOTE — Telephone Encounter (Signed)
   CCM RN Visit Note   10/28/22 Name: ZIARA THELANDER MRN: [id number]      DOB: May 29, 1961  Subjective: DESIA SABAN is a 61 y.o. year old female who is a primary care patient of Adam Phenix DO. The patient was referred to the Chronic Care Management team for assistance with care management needs subsequent to provider initiation of CCM services and plan of care.      An unsuccessful telephone outreach was attempted today to contact the patient about Chronic Care Management needs.    Plan:Telephone follow up appointment with care management team member scheduled for:  will attempt again this week  Jacqlyn Larsen [facility], BSN RN Case Manager [place] [phone]

## 2022-10-28 NOTE — Patient Instructions (Signed)
Please call the care guide team at [phone] if you need to cancel or reschedule your appointment.   If you are experiencing a Mental Health or [place] or need someone to talk to, please call the Suicide and Crisis Lifeline: 988 call the [place] National Suicide Prevention Lifeline: (516) 710-8536 or TTY: (236)409-1892 TTY (401) 308-7397) to talk to a trained counselor call 1-800-273-TALK (toll free, 24 hour hotline) go to [facility] [address], [place] [phone]) call the [facility]: [phone] call 911   Following is a copy of the CCM Program Consent:  CCM service includes personalized support from designated clinical staff supervised by the physician, including individualized plan of care and coordination with other care providers 24/7 contact phone numbers for assistance for urgent and routine care needs. Service will only be billed when office clinical staff spend 20 minutes or more in a month to coordinate care. Only one practitioner may furnish and bill the service in a calendar month. The patient may stop CCM services at amy time (effective at the end of the month) by phone call to the office staff. The patient will be responsible for cost sharing (co-pay) or up to 20% of the service fee (after annual deductible is met)  Following is a copy of your full provider care plan:   Goals Addressed             This Visit's Progress    CCM (HYPERLIPIDEMIA) EXPECTED OUTCOME: MONITOR, SELF-MANAGE AND REDUCE SYMPTOMS OF HYPERLIPIDEMIA       Current Barriers:  Knowledge Deficits related to Hyperlipidemia management Chronic Disease Management support and education needs related to Hyperlipidemia and diet No Advanced Directives in place- pt declines Patient reports she tries to adhere to heart healthy diet Patient reports she was given samples for nexletol and is now out, requests that a new prescription be  sent to [place] with pt on 10/28/22 to let her know prescription for [facility]let[facility] is being [facility] [place] and per primary care provider insurance may not cover,  pt states she will check with pharmacy and if any issues with coverage/ costs, she will let RN care manager know or speak with pharmacist at scheduled appointment on 11/06/22.  Planned Interventions: Provider established cholesterol goals reviewed; Counseled on importance of regular laboratory monitoring as prescribed; Provided HLD educational materials; Reviewed role and benefits of statin for ASCVD risk reduction; Discussed strategies to manage statin-induced myalgias; Reviewed importance of limiting foods high in cholesterol; Screening for signs and symptoms of depression related to chronic disease state;  Assessed social determinant of health barriers;  RN care manager informed pt prescription for nexletol sent to pharmacy and insurance may not cover, encouraged patient to speak with RN care manager or pharmacist for any issues  Symptom Management: Take medications as prescribed   Attend all scheduled provider appointments Call pharmacy for medication refills 3-7 days in advance of running out of medications Attend church or other social activities Perform all self care activities independently  Perform IADL's (shopping, preparing meals, housekeeping, managing finances) independently Call provider office for new concerns or questions  - call for medicine refill 2 or 3 days before it runs out - take all medications exactly as prescribed - call doctor with any symptoms you believe are related to your medicine - call doctor when you experience any new symptoms - go to all doctor appointments as scheduled - adhere to prescribed diet: heart healthy Check with your  pharmacy to see if nexlotol was called in Call RN care manager if any issues with insurance coverage/ costs, you can also speak with pharmacist at  scheduled appointment on 11/06/22 at 65 am  Follow Up Plan: Telephone follow up appointment with care management team member scheduled for:  12/29/22 at 130 pm          Patient verbalizes understanding of instructions and care plan provided today and agrees to view in [place]. Active MyChart status and patient understanding of how to access instructions and care plan via MyChart confirmed with patient.     Telephone follow up appointment with care management team member scheduled for:   12/29/22 at 130 pm

## 2022-10-30 ENCOUNTER — Telehealth: Payer: Self-pay

## 2022-10-30 ENCOUNTER — Other Ambulatory Visit (HOSPITAL_COMMUNITY): Payer: Self-pay

## 2022-10-30 NOTE — Telephone Encounter (Signed)
ERROR

## 2022-10-30 NOTE — Telephone Encounter (Signed)
Received notification from [facility] regarding a prior authorization for Nexletol '180mg'$ . Authorization has been APPROVED from 11/09/21 to 11/08/22.   Authorization # PA Case ID: [id number]   [facility]. Spoke with the pharmacist and he stated they were out of stock and he would order for next week.

## 2022-10-30 NOTE — Telephone Encounter (Signed)
Patient Advocate Encounter   Received notification from [facility] Part D that prior authorization for Nexleton is required.   PA submitted on 10/30/2022 Key [id number] Status is pending       Joneen Boers, [place] Patient Advocate Specialist [place] Patient Advocate Team Direct Number: [phone] Fax: [phone]

## 2022-10-30 NOTE — Telephone Encounter (Signed)
Disregard previous message 

## 2022-10-30 NOTE — Telephone Encounter (Signed)
Pharmacy Patient Advocate Encounter  Prior Authorization for Nexletol '180mg'$  has been approved.    PA# [id number] Effective dates: 10/30/2022 through 11/08/2022

## 2022-11-02 ENCOUNTER — Other Ambulatory Visit (HOSPITAL_COMMUNITY): Payer: Self-pay

## 2022-11-06 ENCOUNTER — Telehealth: Payer: Medicare HMO

## 2022-11-06 NOTE — Telephone Encounter (Signed)
Please sign encounter to remove from the Rx Request que.

## 2022-11-08 DIAGNOSIS — I10 Essential (primary) hypertension: Secondary | ICD-10-CM

## 2022-11-08 DIAGNOSIS — E785 Hyperlipidemia, unspecified: Secondary | ICD-10-CM | POA: Diagnosis not present

## 2022-11-13 DIAGNOSIS — M7918 Myalgia, other site: Secondary | ICD-10-CM | POA: Diagnosis not present

## 2022-11-13 DIAGNOSIS — G894 Chronic pain syndrome: Secondary | ICD-10-CM | POA: Diagnosis not present

## 2022-11-13 DIAGNOSIS — M47896 Other spondylosis, lumbar region: Secondary | ICD-10-CM | POA: Diagnosis not present

## 2022-11-29 NOTE — Progress Notes (Deleted)
Subjective:   [name] is a 62 y.o. female who presents for Medicare Annual (Subsequent) preventive examination.  Review of Systems    ***       Objective:    There were no vitals filed for this visit. There is no height or weight on file to calculate BMI.     10/27/2022    1:32 PM 07/07/2022    3:28 PM 11/20/2021    4:03 PM 10/29/2021    5:00 PM 10/27/2021   11:08 AM 05/01/2021    2:00 PM 04/28/2021    4:37 PM  Advanced Directives  Does Patient Have a Medical Advance Directive? No No No No No No Yes  Type of Advance Directive       [place]  Does patient want to make changes to medical advance directive?      Yes (Inpatient - patient requests chaplain consult to change a medical advance directive) Yes (MAU/Ambulatory/Procedural Areas - Information given)  Copy of [place] in Chart?       No - copy requested  Would patient like information on creating a medical advance directive? No - Patient declined  No - Patient declined No - Patient declined No - Patient declined Yes (Inpatient - patient requests chaplain consult to create a medical advance directive) No - Patient declined    Current Medications (verified) Outpatient Encounter Medications as of 11/30/2022  Medication Sig   albuterol (VENTOLIN HFA) 108 (90 Base) MCG/ACT inhaler Inhale 2 puffs into the lungs every 6 (six) hours as needed for wheezing or shortness of breath.   amitriptyline (ELAVIL) 25 MG tablet Take 25-50 mg by mouth See admin instructions. Take 50 mg by mouth at bedtime, may take 25-50 mg during the day if needed for pain   atenolol (TENORMIN) 50 MG tablet Take 1 tablet (50 mg total) by mouth 2 (two) times daily.   Bempedoic Acid (NEXLETOL) 180 MG TABS Take 180 mg by mouth daily.   busPIRone (BUSPAR) 15 MG tablet TAKE 1 TABLET BY MOUTH 3 TIMES DAILY.   busPIRone (BUSPAR) 5 MG tablet TAKE 1 TABLET BY MOUTH 3 TIMES DAILY. TAKE 1 TABLET WITH THE 15MG TO EQUAL 20MG.    cetirizine (ZYRTEC) 10 MG tablet Take 10 mg by mouth daily.   citalopram (CELEXA) 40 MG tablet Take 1 tablet daily for mood   doxycycline (VIBRA-TABS) 100 MG tablet Take 1 tablet (100 mg total) by mouth 2 (two) times daily. (Patient not taking: Reported on 10/27/2022)   famotidine (PEPCID) 20 MG tablet Take 1 tablet (20 mg total) by mouth daily as needed for heartburn or indigestion.   fluconazole (DIFLUCAN) 150 MG tablet Take one tablet by mouth now. May repeat in 3 days if symptoms persist. (Patient not taking: Reported on 10/27/2022)   fluticasone (FLONASE) 50 MCG/ACT nasal spray Place 2 sprays into both nostrils daily.   hydrALAZINE (APRESOLINE) 50 MG tablet Take 1 tablet (50 mg total) by mouth in the morning and at bedtime.   hydrochlorothiazide (HYDRODIURIL) 25 MG tablet Take 1 tablet (25 mg total) by mouth daily.   latanoprost (XALATAN) 0.005 % ophthalmic solution Place 1 drop into both eyes at bedtime.   linaclotide (LINZESS) 290 MCG CAPS capsule Take 1 capsule (290 mcg total) by mouth daily before breakfast.   lisinopril (ZESTRIL) 10 MG tablet Take 0.5-1 tablets (5-10 mg total) by mouth daily. As directed   nitroGLYCERIN (NITROSTAT) 0.4 MG SL tablet Place 1 tablet (0.4  mg total) under the tongue every 5 (five) minutes as needed for chest pain.   ondansetron (ZOFRAN) 4 MG tablet Take 4 mg by mouth every 8 (eight) hours as needed for nausea.   ondansetron (ZOFRAN) 4 MG tablet Take 1 tablet (4 mg total) by mouth every 8 (eight) hours as needed for nausea or vomiting.   Oxycodone HCl 10 MG TABS Take by mouth.   oxyCODONE-acetaminophen (PERCOCET) 10-325 MG tablet Take 1 tablet by mouth every 4 (four) hours as needed for pain. (Patient not taking: Reported on 10/27/2022)   polyethylene glycol powder (MIRALAX) 17 GM/SCOOP powder Take 17 g by mouth in the morning and at bedtime.   tiZANidine (ZANAFLEX) 4 MG tablet Take 1 tablet (4 mg total) by mouth every 8 (eight) hours as needed for muscle  spasms.   traZODone (DESYREL) 100 MG tablet Take 1.5 tablets (150 mg total) by mouth at bedtime as needed. for sleep   No facility-administered encounter medications on file as of 11/30/2022.    Allergies (verified) Nsaids   History: Past Medical History:  Diagnosis Date   Abdominal pain    Abscess, intra-abdominal, postoperative 12/31/2020   Acute blood loss anemia (ABLA) 05/04/2021   Acute ITP ([place]) 05/08/2021   Allergy    seasonal   Anemia 2021   Anxiety    Arthritis    Aspiration pneumonia of both lower lobes due to gastric secretions ([place]) 12/22/2020   Blood in stool    Blood transfusion without reported diagnosis 12/21/2020   Breast changes, fibrocystic    Bruises easily    Cardiomegaly 2018   Clotting disorder ([place])     itp , none since 1976, no current hematologist   Colon perforation due to stercoral ulcer with E. coli fecal peritonitis    Constipation    DDD (degenerative disc disease), cervical    with lumbar issues   Depression    E. coli sepsis -due to fecal peritonitis 12/23/2020   Generalized headaches    GERD (gastroesophageal reflux disease)    Glaucoma    Hemorrhoid    Hyperlipidemia    Hypertension    Idiopathic thrombocytopenia ([place])    as child   Leg swelling    both ankles   Liver lesion    Nasal congestion    Nausea & vomiting    Neuromuscular disorder (HCC)    back  injury   Neuromuscular disorder ([place])    3 neck surgeries,neck fusion.pin,plates,screws   Osteoporosis    Panic attacks    pt also  has germaphobia   Rectal bleeding    Seizures ([place]) 1998   stress induced, one time, none since, no seizure meds   Seizures ([place])    Stated she had 2 weeks ago,it was like a transient,stare.04/01/21 pt. denies   Spleen enlarged 1976, none since   r/t idiopathic thrombocytopenia   Stercoral ulcer of large intestine 12/21/2020   Trouble swallowing    Unintentional weight loss    Weakness    weakness varies   Past Surgical History:  Procedure  Laterality Date   ABDOMINAL HYSTERECTOMY  1997   BACK SURGERY     CERVICAL SPINE SURGERY  2005, 2007, 2010   x3, fusion done,plates and screws present   [place]  03/14/2012   Procedure: LAPAROSCOPIC CHOLECYSTECTOMY;  Surgeon: Stark Klein, MD;  Location: [facility];  Service: General;  Laterality: N/A;   COLECTOMY WITH COLOSTOMY CREATION/HARTMANN PROCEDURE N/A 12/21/2020   Procedure:  COLECTOMY WITH COLOSTOMY CREATION/HARTMANN PROCEDURE;  Surgeon: Virl Cagey, MD;  Location: [facility];  Service: General;  Laterality: N/A;   COLON SURGERY     colonscopy and esophagogastrodudononescopy  02/04/2012   COLOSTOMY REVERSAL N/A 04/28/2021   Procedure: COLOSTOMY REVERSAL;  Surgeon: Virl Cagey, MD;  Location: [facility];  Service: General;  Laterality: N/A;   [place]  2022   INCISIONAL HERNIA REPAIR N/A 10/29/2021   Procedure: HERNIA REPAIR INCISIONAL; OPEN; WITH MESH;  Surgeon: Virl Cagey, MD;  Location: [facility];  Service: General;  Laterality: N/A;   IR RADIOLOGIST EVAL & MGMT  01/15/2021   LUMBAR [place] SURGERY  11/2014   SPINE SURGERY  2005, 2007, etc   6 surgeries   Family History  Problem Relation Age of Onset   Hypertension Mother    Heart disease Mother    Anxiety disorder Mother    Depression Mother    Lung cancer Father    Cancer Father        lung   Alcohol abuse Father    Diabetes Sister    Bipolar disorder Sister    Anxiety disorder Sister    Heart disease Sister        Congestive Heart Failure   Hypertension Sister    Depression Sister    Heart disease Maternal Grandmother    Colon cancer Paternal Grandmother    Cancer Paternal Grandmother        colon   Arthritis Paternal Uncle    Esophageal cancer Neg Hx    Rectal cancer Neg Hx    Stomach cancer Neg Hx    Breast cancer Neg Hx    Social History   Socioeconomic History   Marital status: Married    Spouse name: Legrand Como   Number of  children: 3   Years of education: 14   Highest education level: Associate degree: academic program  Occupational History   Occupation: Corporate investment banker: UNEMPLOYED  Tobacco Use   Smoking status: Every Day    Packs/day: 1.00    Years: 10.00    Total pack years: 10.00    Types: Cigarettes   Smokeless tobacco: Never   Tobacco comments:    Started 2007  Vaping Use   Vaping Use: Former   Quit date: 09/07/2016   Devices: only used for about a month  Substance and Sexual Activity   Alcohol use: No   Drug use: Yes    Types: Marijuana   Sexual activity: Not Currently  Other Topics Concern   Not on file  Social History Narrative   She lives with her boyfriend.  She used to smoke, quit May 2013, she is not drinking. She has 3 children   Social Determinants of Health   Financial Resource Strain: Medium Risk (10/27/2022)   Overall Financial Resource Strain (CARDIA)    Difficulty of Paying Living Expenses: Somewhat hard  Food Insecurity: Food Insecurity Present (10/27/2022)   Hunger Vital Sign    Worried About Running Out of Food in the Last Year: Sometimes true    Ran Out of Food in the Last Year: Sometimes true  Transportation Needs: No Transportation Needs (10/27/2022)   PRAPARE - Hydrologist (Medical): No    Lack of Transportation (Non-Medical): No  Physical Activity: Inactive (10/27/2022)   Exercise Vital Sign    Days of Exercise per Week: 0 days    Minutes of Exercise per  Session: 0 min  Stress: No Stress Concern Present (10/27/2022)   [place]    Feeling of Stress : Only a little  Social Connections: Socially Integrated (10/27/2022)   Social Connection and Isolation Panel [NHANES]    Frequency of Communication with Friends and Family: More than three times a week    Frequency of Social Gatherings with Friends and Family: Once a week    Attends Religious Services: 1 to 4  times per year    Active Member of [employer] or Organizations: Yes    Attends Archivist Meetings: 1 to 4 times per year    Marital Status: Married    Tobacco Counseling Ready to quit: Not Answered Counseling given: Not Answered Tobacco comments: Started 2007   Clinical Intake:                 Diabetic?No          Activities of Daily Living     No data to display          Patient Care Team: [name], DO as PCP - General (Family Medicine) Harl Bowie, Alphonse Guild, MD as PCP - Cardiology (Cardiology) Starling Manns, MD (Orthopedic Surgery) Harl Bowie Alphonse Guild, MD as Consulting Physician (Cardiology) Lavera Guise, [facility] as [place] Management (Pharmacist) Kassie Mends, RN as [place] any recent [place] you may have received from other than [facility] providers in the past year (date may be approximate).     Assessment:   This is a routine wellness examination for [name].  Hearing/Vision screen No results found.  Dietary issues and exercise activities discussed:     Goals Addressed   None    Depression Screen    10/27/2022   12:39 PM 08/17/2022    2:40 PM 05/06/2022    3:15 PM 11/20/2021    4:00 PM 09/08/2021    1:19 PM 06/18/2021   10:45 AM 05/20/2021    3:34 PM  PHQ 2/9 Scores  PHQ - 2 Score 0 1 2 0 0 2 5  PHQ- 9 Score  3 4  3 6 17    $ Fall Risk    10/27/2022   12:41 PM 05/06/2022    3:15 PM 11/20/2021    4:04 PM 11/16/2021    7:35 AM 09/08/2021    1:19 PM  Fall Risk   Falls in the past year? 0 0 0 0 0  Number falls in past yr:   0 0   Injury with Fall?   0 0   Risk for fall due to :   Orthopedic patient;Medication side effect    Follow up   Falls prevention discussed      [place]:  Any stairs in or around the home? {YES/NO:21197} If so, are there any without handrails? {YES/NO:21197} Home free of loose throw rugs in  walkways, pet beds, electrical cords, etc? {YES/NO:21197} Adequate lighting in your home to reduce risk of falls? {YES/NO:21197}  ASSISTIVE DEVICES UTILIZED TO PREVENT FALLS:  Life alert? {YES/NO:21197} Use of a cane, walker or w/c? {YES/NO:21197} Grab bars in the bathroom? {YES/NO:21197} Shower chair or bench in shower? {YES/NO:21197} Elevated toilet seat or a handicapped toilet? {YES/NO:21197}  TIMED UP AND GO:  Was the test performed? No . Telephonic visit   Cognitive Function:    09/06/2018    2:58 PM  MMSE - Mini  Mental State Exam  Orientation to time 5  Orientation to Place 5  Registration 3  Attention/ Calculation 5  Recall 3  Language- name 2 objects 2  Language- repeat 1  Language- follow 3 step command 3  Language- read & follow direction 1  Write a sentence 1  Copy design 1  Total score 30        09/18/2020    1:32 PM 09/08/2019    2:55 PM  6CIT Screen  What Year? 0 points 0 points  What month? 0 points 0 points  What time? 0 points 0 points  Count back from 20 0 points 0 points  Months in reverse 0 points 0 points  Repeat phrase 0 points 2 points  Total Score 0 points 2 points    Immunizations Immunization History  Administered Date(s) Administered   Influenza,inj,Quad PF,6+ Mos 10/06/2016, 08/05/2017, 08/08/2018, 09/06/2020   Influenza-Unspecified 05/28/2021   Moderna Sars-Covid-2 Vaccination 02/19/2020, 03/18/2020, 10/08/2020, 05/28/2021   PFIZER Comirnaty(Gray Top)Covid-19 Tri-Sucrose Vaccine 05/28/2021   Pfizer Covid-19 Vaccine Bivalent Booster 3yr & up 09/06/2021   Pneumococcal Conjugate-13 10/06/2016   Tdap 02/04/2018   Zoster Recombinat (Shingrix) 09/06/2018    TDAP status: Up to date  {Flu Vaccine status:2101806}  Pneumococcal vaccine status: Up to date  Covid-19 vaccine status: Information provided on how to obtain vaccines.   Qualifies for Shingles Vaccine? Yes   Zostavax completed No   Shingrix Completed?: No.     Education has been provided regarding the importance of this vaccine. Patient has been advised to call insurance company to determine out of pocket expense if they have not yet received this vaccine. Advised may also receive vaccine at local pharmacy or Health Dept. Verbalized acceptance and understanding.  Screening Tests Health Maintenance  Topic Date Due   Zoster Vaccines- Shingrix (2 of 2) 11/01/2018   MAMMOGRAM  07/09/2022   COVID-19 Vaccine (6 - 2023-24 season) 07/10/2022   Medicare Annual Wellness (AWV)  11/20/2022   INFLUENZA VACCINE  02/07/2023 (Originally 06/09/2022)   DEXA SCAN  05/08/2024   DTaP/Tdap/Td (2 - Td or Tdap) 02/05/2028   COLONOSCOPY (Pts 45-489yrInsurance coverage will need to be confirmed)  04/02/2031   Hepatitis C Screening  Completed   HIV Screening  Completed   HPV VACCINES  Aged Out    Health Maintenance  Health Maintenance Due  Topic Date Due   Zoster Vaccines- Shingrix (2 of 2) 11/01/2018   MAMMOGRAM  07/09/2022   COVID-19 Vaccine (6 - 2023-24 season) 07/10/2022   Medicare Annual Wellness (AWV)  11/20/2022    Colorectal cancer screening: Type of screening: Colonoscopy. Completed 04/01/21. Repeat every 10 years  {Mammogram status:21018020}  Bone Density status: Completed 05/08/22. Results reflect: {Bone density results:21018022}  Lung Cancer Screening: (Low Dose CT Chest recommended if Age 555-80ears, 30 pack-year currently smoking OR have quit w/in 15years.) does not qualify.   Lung Cancer Screening Referral: n/a   Additional Screening:  Hepatitis C Screening: does qualify; Completed 07/02/16  Vision Screening: Recommended annual ophthalmology exams for early detection of glaucoma and other disorders of the eye. Is the patient up to date with their annual eye exam?  {YES/NO:21197} Who is the provider or what is the name of the office in which the patient attends annual eye exams? *** If pt is not established with a provider, would they like to  be referred to a provider to establish care? {YES/NO:21197}.   Dental Screening: Recommended annual dental exams for proper oral hygiene  Community Resource Referral / Chronic Care Management: CRR required this visit?  {YES/NO:21197}  CCM required this visit?  {YES/NO:21197}     Plan:     I have personally reviewed and noted the following in the patient's chart:   Medical and social history Use of alcohol, tobacco or illicit drugs  Current medications and supplements including opioid prescriptions. {Opioid [phone]} Functional ability and status Nutritional status Physical activity Advanced directives List of other physicians Hospitalizations, surgeries, and ER visits in previous 12 months Vitals Screenings to include cognitive, depression, and falls Referrals and appointments  In addition, I have reviewed and discussed with patient certain preventive protocols, quality metrics, and best practice recommendations. A written personalized care plan for preventive services as well as general preventive health recommendations were provided to patient.     Vanetta Mulders, [place]   [id number]   Due to this being a virtual visit, the after visit summary with patients personalized plan was offered to patient via mail or my-chart. ***Patient declined at this time./ Patient would like to access on my-chart/ per request, patient was mailed a copy of AVS./ Patient preferred to pick up at office at next visit   Nurse Notes: ***

## 2022-11-29 NOTE — Patient Instructions (Incomplete)
[name] , Thank you for taking time to come for your Medicare Wellness Visit. I appreciate your ongoing commitment to your health goals. Please review the following plan we discussed and let me know if I can assist you in the future.   These are the goals we discussed:  Goals      DIET - INCREASE WATER INTAKE     Try to drink 6-8 glasses of water daily     DIET - REDUCE SUGAR INTAKE     Patient states she would like to 20 lbs.         This is a list of the screening recommended for you and due dates:  Health Maintenance  Topic Date Due   Zoster (Shingles) Vaccine (2 of 2) 11/01/2018   Mammogram  07/09/2022   COVID-19 Vaccine (7 - 2023-24 season) 02/02/2023   Medicare Annual Wellness Visit  12/31/2023   DEXA scan (bone density measurement)  05/08/2024   DTaP/Tdap/Td vaccine (2 - Td or Tdap) 02/05/2028   Colon Cancer Screening  04/02/2031   Flu Shot  Completed   Hepatitis C Screening: USPSTF Recommendation to screen - Ages 18-79 yo.  Completed   HIV Screening  Completed   HPV Vaccine  Aged Out    Advanced directives: Forms are available if you choose in the future to pursue completion.  This is recommended in order to make sure that your health wishes are honored in the event that you are unable to verbalize them to the provider.    Conditions/risks identified: Aim for 30 minutes of exercise or brisk walking, 6-8 glasses of water, and 5 servings of fruits and vegetables each day.   Next appointment: Follow up in one year for your annual wellness visit.   The number to schedule your mammogram is [phone] ask for it to be done on the mobile unit that comes to [facility]   Preventive Care 40-64 Years, Female Preventive care refers to lifestyle choices and visits with your health care provider that can promote health and wellness. What does preventive care include? A yearly physical exam. This is also called an annual well check. Dental exams once  or twice a year. Routine eye exams. Ask your health care provider how often you should have your eyes checked. Personal lifestyle choices, including: Daily care of your teeth and gums. Regular physical activity. Eating a healthy diet. Avoiding tobacco and drug use. Limiting alcohol use. Practicing safe sex. Taking low-dose aspirin daily starting at age 50. Taking vitamin and mineral supplements as recommended by your health care provider. What happens during an annual well check? The services and screenings done by your health care provider during your annual well check will depend on your age, overall health, lifestyle risk factors, and family history of disease. Counseling  Your health care provider may ask you questions about your: Alcohol use. Tobacco use. Drug use. Emotional well-being. Home and relationship well-being. Sexual activity. Eating habits. Work and work environment. Method of birth control. Menstrual cycle. Pregnancy history. Screening  You may have the following tests or measurements: Height, weight, and BMI. Blood pressure. Lipid and cholesterol levels. These may be checked every 5 years, or more frequently if you are over 50 years old. Skin check. Lung cancer screening. You may have this screening every year starting at age 55 if you have a 30-pack-year history of smoking and currently smoke or have quit within the past 15 years. Fecal occult blood test (FOBT) of the   stool. You may have this test every year starting at age 50. Flexible sigmoidoscopy or colonoscopy. You may have a sigmoidoscopy every 5 years or a colonoscopy every 10 years starting at age 50. Hepatitis C blood test. Hepatitis B blood test. Sexually transmitted disease (STD) testing. Diabetes screening. This is done by checking your blood sugar (glucose) after you have not eaten for a while (fasting). You may have this done every 1-3 years. Mammogram. This may be done every 1-2 years. Talk to  your health care provider about when you should start having regular mammograms. This may depend on whether you have a family history of breast cancer. BRCA-related cancer screening. This may be done if you have a family history of breast, ovarian, tubal, or peritoneal cancers. Pelvic exam and Pap test. This may be done every 3 years starting at age 21. Starting at age 30, this may be done every 5 years if you have a Pap test in combination with an HPV test. Bone density scan. This is done to screen for osteoporosis. You may have this scan if you are at high risk for osteoporosis. Discuss your test results, treatment options, and if necessary, the need for more tests with your health care provider. Vaccines  Your health care provider may recommend certain vaccines, such as: Influenza vaccine. This is recommended every year. Tetanus, diphtheria, and acellular pertussis (Tdap, Td) vaccine. You may need a Td booster every 10 years. Zoster vaccine. You may need this after age 60. Pneumococcal 13-valent conjugate (PCV13) vaccine. You may need this if you have certain conditions and were not previously vaccinated. Pneumococcal polysaccharide (PPSV23) vaccine. You may need one or two doses if you smoke cigarettes or if you have certain conditions. Talk to your health care provider about which screenings and vaccines you need and how often you need them. This information is not intended to replace advice given to you by your health care provider. Make sure you discuss any questions you have with your health care provider. Document Released: 11/22/2015 Document Revised: 07/15/2016 Document Reviewed: 08/27/2015 Elsevier Interactive Patient Education  2017 [employer]    Fall Prevention in the Home Falls can cause injuries. They can happen to people of all ages. There are many things you can do to make your home safe and to help prevent falls. What can I do on the outside of my home? Regularly fix the  edges of walkways and driveways and fix any cracks. Remove anything that might make you trip as you walk through a door, such as a raised step or threshold. Trim any bushes or trees on the path to your home. Use bright outdoor lighting. Clear any walking paths of anything that might make someone trip, such as rocks or tools. Regularly check to see if handrails are loose or broken. Make sure that both sides of any steps have handrails. Any raised decks and porches should have guardrails on the edges. Have any leaves, snow, or ice cleared regularly. Use sand or salt on walking paths during winter. Clean up any spills in your garage right away. This includes oil or grease spills. What can I do in the bathroom? Use night lights. Install grab bars by the toilet and in the tub and shower. Do not use towel bars as grab bars. Use non-skid mats or decals in the tub or shower. If you need to sit down in the shower, use a plastic, non-slip stool. Keep the floor dry. Clean up any water that spills   on the floor as soon as it happens. Remove soap buildup in the tub or shower regularly. Attach bath mats securely with double-sided non-slip rug tape. Do not have throw rugs and other things on the floor that can make you trip. What can I do in the bedroom? Use night lights. Make sure that you have a light by your bed that is easy to reach. Do not use any sheets or blankets that are too big for your bed. They should not hang down onto the floor. Have a firm chair that has side arms. You can use this for support while you get dressed. Do not have throw rugs and other things on the floor that can make you trip. What can I do in the kitchen? Clean up any spills right away. Avoid walking on wet floors. Keep items that you use a lot in easy-to-reach places. If you need to reach something above you, use a strong step stool that has a grab bar. Keep electrical cords out of the way. Do not use floor polish or  wax that makes floors slippery. If you must use wax, use non-skid floor wax. Do not have throw rugs and other things on the floor that can make you trip. What can I do with my stairs? Do not leave any items on the stairs. Make sure that there are handrails on both sides of the stairs and use them. Fix handrails that are broken or loose. Make sure that handrails are as long as the stairways. Check any carpeting to make sure that it is firmly attached to the stairs. Fix any carpet that is loose or worn. Avoid having throw rugs at the top or bottom of the stairs. If you do have throw rugs, attach them to the floor with carpet tape. Make sure that you have a light switch at the top of the stairs and the bottom of the stairs. If you do not have them, ask someone to add them for you. What else can I do to help prevent falls? Wear shoes that: Do not have high heels. Have rubber bottoms. Are comfortable and fit you well. Are closed at the toe. Do not wear sandals. If you use a stepladder: Make sure that it is fully opened. Do not climb a closed stepladder. Make sure that both sides of the stepladder are locked into place. Ask someone to hold it for you, if possible. Clearly mark and make sure that you can see: Any grab bars or handrails. First and last steps. Where the edge of each step is. Use tools that help you move around (mobility aids) if they are needed. These include: Canes. Walkers. Scooters. Crutches. Turn on the lights when you go into a dark area. Replace any light bulbs as soon as they burn out. Set up your furniture so you have a clear path. Avoid moving your furniture around. If any of your floors are uneven, fix them. If there are any pets around you, be aware of where they are. Review your medicines with your doctor. Some medicines can make you feel dizzy. This can increase your chance of falling. Ask your doctor what other things that you can do to help prevent falls. This  information is not intended to replace advice given to you by your health care provider. Make sure you discuss any questions you have with your health care provider. Document Released: 08/22/2009 Document Revised: 04/02/2016 Document Reviewed: 11/30/2014 Elsevier Interactive Patient Education  2017 [employer]    cracks. Remove anything that might make you trip as you walk through a door, such as a raised step or threshold. Trim any bushes or trees on the path to your home. Use bright outdoor lighting. Clear any walking paths of anything that might make someone trip, such as rocks or tools. Regularly check to see if handrails are loose or broken. Make sure that both sides of any steps have handrails. Any raised decks and porches should have guardrails on the edges. Have any leaves, snow, or ice cleared regularly. Use sand or salt on walking paths during winter. Clean up any spills in your garage right away. This includes oil or grease spills. What  can I do in the bathroom? Use night lights. Install grab bars by the toilet and in the tub and shower. Do not use towel bars as grab bars. Use non-skid mats or decals in the tub or shower. If you need to sit down in the shower, use a plastic, non-slip stool. Keep the floor dry. Clean up any water that spills on the floor as soon as it happens. Remove soap buildup in the tub or shower regularly. Attach bath mats securely with double-sided non-slip rug tape. Do not have throw rugs and other things on the floor that can make you trip. What can I do in the bedroom? Use night lights. Make sure that you have a light by your bed that is easy to reach. Do not use any sheets or blankets that are too big for your bed. They should not hang down onto the floor. Have a firm chair that has side arms. You can use this for support while you get dressed. Do not have throw rugs and other things on the floor that can make you trip. What can I do in the kitchen? Clean up any spills right away. Avoid walking on wet floors. Keep items that you use a lot in easy-to-reach places. If you need to reach something above you, use a strong step stool that has a grab bar. Keep electrical cords out of the way. Do not use floor polish or wax that makes floors slippery. If you must use wax, use non-skid floor wax. Do not have throw rugs and other things on the floor that can make you trip. What can I do with my stairs? Do not leave any items on the stairs. Make sure that there are handrails on both sides of the stairs and use them. Fix handrails that are broken or loose. Make sure that handrails are as long as the stairways. Check any carpeting to make sure that it is firmly attached to the stairs. Fix any carpet that is loose or worn. Avoid having throw rugs at the top or bottom of the stairs. If you do have throw rugs, attach them to the floor with carpet tape. Make sure that you have a light switch at the top of the  stairs and the bottom of the stairs. If you do not have them, ask someone to add them for you. What else can I do to help prevent falls? Wear shoes that: Do not have high heels. Have rubber bottoms. Are comfortable and fit you well. Are closed at the toe. Do not wear sandals. If you use a stepladder: Make sure that it is fully opened. Do not climb a closed stepladder. Make sure that both sides of the stepladder are locked into place. Ask someone to hold it for you, if possible. Clearly mark and make sure  that you can see: Any grab bars or handrails. First and last steps. Where the edge of each step is. Use tools that help you move around (mobility aids) if they are needed. These include: Canes. Walkers. Scooters. Crutches. Turn on the lights when you go into a dark area. Replace any light bulbs as soon as they burn out. Set up your furniture so you have a clear path. Avoid moving your furniture around. If any of your floors are uneven, fix them. If there are any pets around you, be aware of where they are. Review your medicines with your doctor. Some medicines can make you feel dizzy. This can increase your chance of falling. Ask your doctor what other things that you can do to help prevent falls. This information is not intended to replace advice given to you by your health care provider. Make sure you discuss any questions you have with your health care provider. Document Released: 08/22/2009 Document Revised: 04/02/2016 Document Reviewed: 11/30/2014 Elsevier Interactive Patient Education  2017 [employer].

## 2022-12-05 ENCOUNTER — Other Ambulatory Visit: Payer: Self-pay | Admitting: Gastroenterology

## 2022-12-05 ENCOUNTER — Other Ambulatory Visit: Payer: Self-pay | Admitting: Family Medicine

## 2022-12-07 MED ORDER — CETIRIZINE HCL 10 MG PO TABS: 10.0000 mg | ORAL_TABLET | Freq: Every day | ORAL | 3 refills | Status: AC

## 2022-12-07 MED ORDER — NITROGLYCERIN 0.4 MG SL SUBL: 0.4000 mg | SUBLINGUAL_TABLET | SUBLINGUAL | 0 refills | Status: DC | PRN

## 2022-12-09 MED ORDER — FAMOTIDINE 20 MG PO TABS: 20.0000 mg | ORAL_TABLET | Freq: Every day | ORAL | 6 refills | Status: DC | PRN

## 2022-12-09 NOTE — Telephone Encounter (Signed)
This is a patient of Dr Ardis Hughs.  Please advise if OK to refill as you are DOD pm.

## 2022-12-11 DIAGNOSIS — M47896 Other spondylosis, lumbar region: Secondary | ICD-10-CM | POA: Diagnosis not present

## 2022-12-11 DIAGNOSIS — G894 Chronic pain syndrome: Secondary | ICD-10-CM | POA: Diagnosis not present

## 2022-12-29 ENCOUNTER — Telehealth: Payer: Self-pay | Admitting: *Deleted

## 2022-12-29 ENCOUNTER — Telehealth: Payer: Medicare HMO

## 2022-12-29 NOTE — Patient Instructions (Incomplete)
[name] , Thank you for taking time to come for your Medicare Wellness Visit. I appreciate your ongoing commitment to your health goals. Please review the following plan we discussed and let me know if I can assist you in the future.   These are the goals we discussed:  Goals      DIET - INCREASE WATER INTAKE     Try to drink 6-8 glasses of water daily     DIET - REDUCE SUGAR INTAKE     Patient states she would like to 20 lbs.         This is a list of the screening recommended for you and due dates:  Health Maintenance  Topic Date Due   Zoster (Shingles) Vaccine (2 of 2) 11/01/2018   Mammogram  07/09/2022   COVID-19 Vaccine (7 - 2023-24 season) 02/02/2023   Medicare Annual Wellness Visit  12/31/2023   DEXA scan (bone density measurement)  05/08/2024   DTaP/Tdap/Td vaccine (2 - Td or Tdap) 02/05/2028   Colon Cancer Screening  04/02/2031   Flu Shot  Completed   Hepatitis C Screening: USPSTF Recommendation to screen - Ages 18-79 yo.  Completed   HIV Screening  Completed   HPV Vaccine  Aged Out    Advanced directives: Forms are available if you choose in the future to pursue completion.  This is recommended in order to make sure that your health wishes are honored in the event that you are unable to verbalize them to the provider.    Conditions/risks identified: Aim for 30 minutes of exercise or brisk walking, 6-8 glasses of water, and 5 servings of fruits and vegetables each day.   Next appointment: Follow up in one year for your annual wellness visit.   The number to schedule your mammogram is [phone] ask for it to be done on the mobile unit that comes to [facility]   Preventive Care 40-64 Years, Female Preventive care refers to lifestyle choices and visits with your health care provider that can promote health and wellness. What does preventive care include? A yearly physical exam. This is also called an annual well check. Dental exams once  or twice a year. Routine eye exams. Ask your health care provider how often you should have your eyes checked. Personal lifestyle choices, including: Daily care of your teeth and gums. Regular physical activity. Eating a healthy diet. Avoiding tobacco and drug use. Limiting alcohol use. Practicing safe sex. Taking low-dose aspirin daily starting at age 14. Taking vitamin and mineral supplements as recommended by your health care provider. What happens during an annual well check? The services and screenings done by your health care provider during your annual well check will depend on your age, overall health, lifestyle risk factors, and family history of disease. Counseling  Your health care provider may ask you questions about your: Alcohol use. Tobacco use. Drug use. Emotional well-being. Home and relationship well-being. Sexual activity. Eating habits. Work and work Astron[employer]. Method of birth control. Menstrual cycle. Pregnancy history. Screening  You may have the following tests or measurements: Height, weight, and BMI. Blood pressure. Lipid and cholesterol levels. These may be checked every 5 years, or more frequently if you are over 57 years old. Skin check. Lung cancer screening. You may have this screening every year starting at age 2 if you have a 30-pack-year history of smoking and currently smoke or have quit within the past 15 years. Fecal occult blood test (FOBT) of the  stool. You may have this test every year starting at age 46. Flexible sigmoidoscopy or colonoscopy. You may have a sigmoidoscopy every 5 years or a colonoscopy every 10 years starting at age 4. Hepatitis C blood test. Hepatitis B blood test. Sexually transmitted disease (STD) testing. Diabetes screening. This is done by checking your blood sugar (glucose) after you have not eaten for a while (fasting). You may have this done every 1-3 years. Mammogram. This may be done every 1-2 years. Talk to  your health care provider about when you should start having regular mammograms. This may depend on whether you have a family history of breast cancer. BRCA-related cancer screening. This may be done if you have a family history of breast, ovarian, tubal, or peritoneal cancers. Pelvic exam and Pap test. This may be done every 3 years starting at age 68. Starting at age 38, this may be done every 5 years if you have a Pap test in combination with an HPV test. Bone density scan. This is done to screen for osteoporosis. You may have this scan if you are at high risk for osteoporosis. Discuss your test results, treatment options, and if necessary, the need for more tests with your health care provider. Vaccines  Your health care provider may recommend certain vaccines, such as: Influenza vaccine. This is recommended every year. Tetanus, diphtheria, and acellular pertussis (Tdap, Td) vaccine. You may need a Td booster every 10 years. Zoster vaccine. You may need this after age 83. Pneumococcal 13-valent conjugate (PCV13) vaccine. You may need this if you have certain conditions and were not previously vaccinated. Pneumococcal polysaccharide (PPSV23) vaccine. You may need one or two doses if you smoke cigarettes or if you have certain conditions. Talk to your health care provider about which screenings and vaccines you need and how often you need them. This information is not intended to replace advice given to you by your health care provider. Make sure you discuss any questions you have with your health care provider. Document Released: 11/22/2015 Document Revised: 07/15/2016 Document Reviewed: 08/27/2015 Elsevier Interactive Patient Education  2017 [employer].    Fall Prevention in the Home Falls can cause injuries. They can happen to people of all ages. There are many things you can do to make your home safe and to help prevent falls. What can I do on the outside of my home? Regularly fix the  edges of walkways and driveways and fix any cracks. Remove anything that might make you trip as you walk through a door, such as a raised step or threshold. Trim any bushes or trees on the path to your home. Use bright outdoor lighting. Clear any walking paths of anything that might make someone trip, such as rocks or tools. Regularly check to see if handrails are loose or broken. Make sure that both sides of any steps have handrails. Any raised decks and porches should have guardrails on the edges. Have any leaves, snow, or ice cleared regularly. Use sand or salt on walking paths during winter. Clean up any spills in your garage right away. This includes oil or grease spills. What can I do in the bathroom? Use night lights. Install grab bars by the toilet and in the tub and shower. Do not use towel bars as grab bars. Use non-skid mats or decals in the tub or shower. If you need to sit down in the shower, use a plastic, non-slip stool. Keep the floor dry. Clean up any water that spills  on the floor as soon as it happens. Remove soap buildup in the tub or shower regularly. Attach bath mats securely with double-sided non-slip rug tape. Do not have throw rugs and other things on the floor that can make you trip. What can I do in the bedroom? Use night lights. Make sure that you have a light by your bed that is easy to reach. Do not use any sheets or blankets that are too big for your bed. They should not hang down onto the floor. Have a firm chair that has side arms. You can use this for support while you get dressed. Do not have throw rugs and other things on the floor that can make you trip. What can I do in the kitchen? Clean up any spills right away. Avoid walking on wet floors. Keep items that you use a lot in easy-to-reach places. If you need to reach something above you, use a strong step stool that has a grab bar. Keep electrical cords out of the way. Do not use floor polish or  wax that makes floors slippery. If you must use wax, use non-skid floor wax. Do not have throw rugs and other things on the floor that can make you trip. What can I do with my stairs? Do not leave any items on the stairs. Make sure that there are handrails on both sides of the stairs and use them. Fix handrails that are broken or loose. Make sure that handrails are as long as the stairways. Check any carpeting to make sure that it is firmly attached to the stairs. Fix any carpet that is loose or worn. Avoid having throw rugs at the top or bottom of the stairs. If you do have throw rugs, attach them to the floor with carpet tape. Make sure that you have a light switch at the top of the stairs and the bottom of the stairs. If you do not have them, ask someone to add them for you. What else can I do to help prevent falls? Wear shoes that: Do not have high heels. Have rubber bottoms. Are comfortable and fit you well. Are closed at the toe. Do not wear sandals. If you use a stepladder: Make sure that it is fully opened. Do not climb a closed stepladder. Make sure that both sides of the stepladder are locked into place. Ask someone to hold it for you, if possible. Clearly mark and make sure that you can see: Any grab bars or handrails. First and last steps. Where the edge of each step is. Use tools that help you move around (mobility aids) if they are needed. These include: Canes. Walkers. Scooters. Crutches. Turn on the lights when you go into a dark area. Replace any light bulbs as soon as they burn out. Set up your furniture so you have a clear path. Avoid moving your furniture around. If any of your floors are uneven, fix them. If there are any pets around you, be aware of where they are. Review your medicines with your doctor. Some medicines can make you feel dizzy. This can increase your chance of falling. Ask your doctor what other things that you can do to help prevent falls. This  information is not intended to replace advice given to you by your health care provider. Make sure you discuss any questions you have with your health care provider. Document Released: 08/22/2009 Document Revised: 04/02/2016 Document Reviewed: 11/30/2014 Elsevier Interactive Patient Education  2017 [employer].

## 2022-12-29 NOTE — Progress Notes (Unsigned)
Subjective:   [name] is a 62 y.o. female who presents for Medicare Annual (Subsequent) preventive examination.  Review of Systems    ***       Objective:    There were no vitals filed for this visit. There is no height or weight on file to calculate BMI.     10/27/2022    1:32 PM 07/07/2022    3:28 PM 11/20/2021    4:03 PM 10/29/2021    5:00 PM 10/27/2021   11:08 AM 05/01/2021    2:00 PM 04/28/2021    4:37 PM  Advanced Directives  Does Patient Have a Medical Advance Directive? No No No No No No Yes  Type of Advance Directive       [place]  Does patient want to make changes to medical advance directive?      Yes (Inpatient - patient requests chaplain consult to change a medical advance directive) Yes (MAU/Ambulatory/Procedural Areas - Information given)  Copy of [place] in Chart?       No - copy requested  Would patient like information on creating a medical advance directive? No - Patient declined  No - Patient declined No - Patient declined No - Patient declined Yes (Inpatient - patient requests chaplain consult to create a medical advance directive) No - Patient declined    Current Medications (verified) Outpatient Encounter Medications as of 12/30/2022  Medication Sig   albuterol (VENTOLIN HFA) 108 (90 Base) MCG/ACT inhaler Inhale 2 puffs into the lungs every 6 (six) hours as needed for wheezing or shortness of breath.   amitriptyline (ELAVIL) 25 MG tablet Take 25-50 mg by mouth See admin instructions. Take 50 mg by mouth at bedtime, may take 25-50 mg during the day if needed for pain   atenolol (TENORMIN) 50 MG tablet Take 1 tablet (50 mg total) by mouth 2 (two) times daily.   Bempedoic Acid (NEXLETOL) 180 MG TABS Take 180 mg by mouth daily.   busPIRone (BUSPAR) 15 MG tablet TAKE 1 TABLET BY MOUTH 3 TIMES DAILY.   busPIRone (BUSPAR) 5 MG tablet TAKE 1 TABLET BY MOUTH 3 TIMES DAILY. TAKE 1 TABLET WITH THE 15MG TO EQUAL 20MG.    cetirizine (ZYRTEC) 10 MG tablet Take 1 tablet (10 mg total) by mouth daily.   citalopram (CELEXA) 40 MG tablet Take 1 tablet daily for mood   doxycycline (VIBRA-TABS) 100 MG tablet Take 1 tablet (100 mg total) by mouth 2 (two) times daily. (Patient not taking: Reported on 10/27/2022)   famotidine (PEPCID) 20 MG tablet Take 1 tablet (20 mg total) by mouth daily as needed for heartburn or indigestion.   fluconazole (DIFLUCAN) 150 MG tablet Take one tablet by mouth now. May repeat in 3 days if symptoms persist. (Patient not taking: Reported on 10/27/2022)   fluticasone (FLONASE) 50 MCG/ACT nasal spray Place 2 sprays into both nostrils daily.   hydrALAZINE (APRESOLINE) 50 MG tablet Take 1 tablet (50 mg total) by mouth in the morning and at bedtime.   hydrochlorothiazide (HYDRODIURIL) 25 MG tablet Take 1 tablet (25 mg total) by mouth daily.   latanoprost (XALATAN) 0.005 % ophthalmic solution Place 1 drop into both eyes at bedtime.   linaclotide (LINZESS) 290 MCG CAPS capsule Take 1 capsule (290 mcg total) by mouth daily before breakfast.   lisinopril (ZESTRIL) 10 MG tablet Take 0.5-1 tablets (5-10 mg total) by mouth daily. As directed   nitroGLYCERIN (NITROSTAT) 0.4 MG SL tablet Place  1 tablet (0.4 mg total) under the tongue every 5 (five) minutes as needed for chest pain.   ondansetron (ZOFRAN) 4 MG tablet Take 4 mg by mouth every 8 (eight) hours as needed for nausea.   ondansetron (ZOFRAN) 4 MG tablet Take 1 tablet (4 mg total) by mouth every 8 (eight) hours as needed for nausea or vomiting.   Oxycodone HCl 10 MG TABS Take by mouth.   oxyCODONE-acetaminophen (PERCOCET) 10-325 MG tablet Take 1 tablet by mouth every 4 (four) hours as needed for pain. (Patient not taking: Reported on 10/27/2022)   polyethylene glycol powder (MIRALAX) 17 GM/SCOOP powder Take 17 g by mouth in the morning and at bedtime.   tiZANidine (ZANAFLEX) 4 MG tablet Take 1 tablet (4 mg total) by mouth every 8 (eight) hours as  needed for muscle spasms.   traZODone (DESYREL) 100 MG tablet Take 1.5 tablets (150 mg total) by mouth at bedtime as needed. for sleep   No facility-administered encounter medications on file as of 12/30/2022.    Allergies (verified) Nsaids   History: Past Medical History:  Diagnosis Date   Abdominal pain    Abscess, intra-abdominal, postoperative 12/31/2020   Acute blood loss anemia (ABLA) 05/04/2021   Acute ITP ([place]) 05/08/2021   Allergy    seasonal   Anemia 2021   Anxiety    Arthritis    Aspiration pneumonia of both lower lobes due to gastric secretions ([place]) 12/22/2020   Blood in stool    Blood transfusion without reported diagnosis 12/21/2020   Breast changes, fibrocystic    Bruises easily    Cardiomegaly 2018   Clotting disorder ([place])     itp , none since 1976, no current hematologist   Colon perforation due to stercoral ulcer with E. coli fecal peritonitis    Constipation    DDD (degenerative disc disease), cervical    with lumbar issues   Depression    E. coli sepsis -due to fecal peritonitis 12/23/2020   Generalized headaches    GERD (gastroesophageal reflux disease)    Glaucoma    Hemorrhoid    Hyperlipidemia    Hypertension    Idiopathic thrombocytopenia ([place])    as child   Leg swelling    both ankles   Liver lesion    Nasal congestion    Nausea & vomiting    Neuromuscular disorder (HCC)    back  injury   Neuromuscular disorder ([place])    3 neck surgeries,neck fusion.pin,plates,screws   Osteoporosis    Panic attacks    pt also  has germaphobia   Rectal bleeding    Seizures ([place]) 1998   stress induced, one time, none since, no seizure meds   Seizures ([place])    Stated she had 2 weeks ago,it was like a transient,stare.04/01/21 pt. denies   Spleen enlarged 1976, none since   r/t idiopathic thrombocytopenia   Stercoral ulcer of large intestine 12/21/2020   Trouble swallowing    Unintentional weight loss    Weakness    weakness varies   Past Surgical  History:  Procedure Laterality Date   ABDOMINAL HYSTERECTOMY  1997   BACK SURGERY     CERVICAL SPINE SURGERY  2005, 2007, 2010   x3, fusion done,plates and screws present   [place]  03/14/2012   Procedure: LAPAROSCOPIC CHOLECYSTECTOMY;  Surgeon: Stark Klein, MD;  Location: [facility];  Service: General;  Laterality: N/A;   COLECTOMY WITH COLOSTOMY CREATION/HARTMANN PROCEDURE N/A 12/21/2020  Procedure: COLECTOMY WITH COLOSTOMY CREATION/HARTMANN PROCEDURE;  Surgeon: Virl Cagey, MD;  Location: [facility];  Service: General;  Laterality: N/A;   COLON SURGERY     colonscopy and esophagogastrodudononescopy  02/04/2012   COLOSTOMY REVERSAL N/A 04/28/2021   Procedure: COLOSTOMY REVERSAL;  Surgeon: Virl Cagey, MD;  Location: [facility];  Service: General;  Laterality: N/A;   [place]  2022   INCISIONAL HERNIA REPAIR N/A 10/29/2021   Procedure: HERNIA REPAIR INCISIONAL; OPEN; WITH MESH;  Surgeon: Virl Cagey, MD;  Location: [facility];  Service: General;  Laterality: N/A;   IR RADIOLOGIST EVAL & MGMT  01/15/2021   LUMBAR [place] SURGERY  11/2014   SPINE SURGERY  2005, 2007, etc   6 surgeries   Family History  Problem Relation Age of Onset   Hypertension Mother    Heart disease Mother    Anxiety disorder Mother    Depression Mother    Lung cancer Father    Cancer Father        lung   Alcohol abuse Father    Diabetes Sister    Bipolar disorder Sister    Anxiety disorder Sister    Heart disease Sister        Congestive Heart Failure   Hypertension Sister    Depression Sister    Heart disease Maternal Grandmother    Colon cancer Paternal Grandmother    Cancer Paternal Grandmother        colon   Arthritis Paternal Uncle    Esophageal cancer Neg Hx    Rectal cancer Neg Hx    Stomach cancer Neg Hx    Breast cancer Neg Hx    Social History   Socioeconomic History   Marital status: Married    Spouse name:  Legrand Como   Number of children: 3   Years of education: 14   Highest education level: Associate degree: academic program  Occupational History   Occupation: Corporate investment banker: UNEMPLOYED  Tobacco Use   Smoking status: Every Day    Packs/day: 1.00    Years: 10.00    Total pack years: 10.00    Types: Cigarettes   Smokeless tobacco: Never   Tobacco comments:    Started 2007  Vaping Use   Vaping Use: Former   Quit date: 09/07/2016   Devices: only used for about a month  Substance and Sexual Activity   Alcohol use: No   Drug use: Yes    Types: Marijuana   Sexual activity: Not Currently  Other Topics Concern   Not on file  Social History Narrative   She lives with her boyfriend.  She used to smoke, quit May 2013, she is not drinking. She has 3 children   Social Determinants of Health   Financial Resource Strain: Medium Risk (10/27/2022)   Overall Financial Resource Strain (CARDIA)    Difficulty of Paying Living Expenses: Somewhat hard  Food Insecurity: Food Insecurity Present (10/27/2022)   Hunger Vital Sign    Worried About Running Out of Food in the Last Year: Sometimes true    Ran Out of Food in the Last Year: Sometimes true  Transportation Needs: No Transportation Needs (10/27/2022)   PRAPARE - Hydrologist (Medical): No    Lack of Transportation (Non-Medical): No  Physical Activity: Inactive (10/27/2022)   Exercise Vital Sign    Days of Exercise per Week: 0 days    Minutes of Exercise  per Session: 0 min  Stress: No Stress Concern Present (10/27/2022)   [place]    Feeling of Stress : Only a little  Social Connections: Socially Integrated (10/27/2022)   Social Connection and Isolation Panel [NHANES]    Frequency of Communication with Friends and Family: More than three times a week    Frequency of Social Gatherings with Friends and Family: Once a week    Attends  Religious Services: 1 to 4 times per year    Active Member of [employer] or Organizations: Yes    Attends Archivist Meetings: 1 to 4 times per year    Marital Status: Married    Tobacco Counseling Ready to quit: Not Answered Counseling given: Not Answered Tobacco comments: Started 2007   Clinical Intake:                 Diabetic?No          Activities of Daily Living     No data to display          Patient Care Team: [name], DO as PCP - General (Family Medicine) Harl Bowie, Alphonse Guild, MD as PCP - Cardiology (Cardiology) Starling Manns, MD (Orthopedic Surgery) Harl Bowie Alphonse Guild, MD as Consulting Physician (Cardiology) Lavera Guise, [facility] as [place] Management (Pharmacist) Kassie Mends, RN as [place] any recent [place] you may have received from other than [facility] providers in the past year (date may be approximate).     Assessment:   This is a routine wellness examination for [place].  Hearing/Vision screen No results found.  Dietary issues and exercise activities discussed:     Goals Addressed   None    Depression Screen    10/27/2022   12:39 PM 08/17/2022    2:40 PM 05/06/2022    3:15 PM 11/20/2021    4:00 PM 09/08/2021    1:19 PM 06/18/2021   10:45 AM 05/20/2021    3:34 PM  PHQ 2/9 Scores  PHQ - 2 Score 0 1 2 0 0 2 5  PHQ- 9 Score  3 4  3 6 17    $ Fall Risk    10/27/2022   12:41 PM 05/06/2022    3:15 PM 11/20/2021    4:04 PM 11/16/2021    7:35 AM 09/08/2021    1:19 PM  Fall Risk   Falls in the past year? 0 0 0 0 0  Number falls in past yr:   0 0   Injury with Fall?   0 0   Risk for fall due to :   Orthopedic patient;Medication side effect    Follow up   Falls prevention discussed      [place]:  Any stairs in or around the home? {YES/NO:21197} If so, are there any without handrails? {YES/NO:21197} Home free  of loose throw rugs in walkways, pet beds, electrical cords, etc? {YES/NO:21197} Adequate lighting in your home to reduce risk of falls? {YES/NO:21197}  ASSISTIVE DEVICES UTILIZED TO PREVENT FALLS:  Life alert? {YES/NO:21197} Use of a cane, walker or w/c? {YES/NO:21197} Grab bars in the bathroom? {YES/NO:21197} Shower chair or bench in shower? {YES/NO:21197} Elevated toilet seat or a handicapped toilet? {YES/NO:21197}  TIMED UP AND GO:  Was the test performed? No . Telephonic visit   Cognitive Function:    09/06/2018    2:58 PM  MMSE -  Mini Mental State Exam  Orientation to time 5  Orientation to Place 5  Registration 3  Attention/ Calculation 5  Recall 3  Language- name 2 objects 2  Language- repeat 1  Language- follow 3 step command 3  Language- read & follow direction 1  Write a sentence 1  Copy design 1  Total score 30        09/18/2020    1:32 PM 09/08/2019    2:55 PM  6CIT Screen  What Year? 0 points 0 points  What month? 0 points 0 points  What time? 0 points 0 points  Count back from 20 0 points 0 points  Months in reverse 0 points 0 points  Repeat phrase 0 points 2 points  Total Score 0 points 2 points    Immunizations Immunization History  Administered Date(s) Administered   Influenza,inj,Quad PF,6+ Mos 10/06/2016, 08/05/2017, 08/08/2018, 09/06/2020   Influenza-Unspecified 05/28/2021   Moderna Sars-Covid-2 Vaccination 02/19/2020, 03/18/2020, 10/08/2020, 05/28/2021   PFIZER Comirnaty(Gray Top)Covid-19 Tri-Sucrose Vaccine 05/28/2021   Pfizer Covid-19 Vaccine Bivalent Booster 37yr & up 09/06/2021   Pneumococcal Conjugate-13 10/06/2016   Tdap 02/04/2018   Zoster Recombinat (Shingrix) 09/06/2018    TDAP status: Up to date  {Flu Vaccine status:2101806}  Pneumococcal vaccine status: Up to date  Covid-19 vaccine status: Information provided on how to obtain vaccines.   Qualifies for Shingles Vaccine? Yes   Zostavax completed No   Shingrix  Completed?: No.    Education has been provided regarding the importance of this vaccine. Patient has been advised to call insurance company to determine out of pocket expense if they have not yet received this vaccine. Advised may also receive vaccine at local pharmacy or Health Dept. Verbalized acceptance and understanding.  Screening Tests Health Maintenance  Topic Date Due   Zoster Vaccines- Shingrix (2 of 2) 11/01/2018   MAMMOGRAM  07/09/2022   COVID-19 Vaccine (6 - 2023-24 season) 07/10/2022   Medicare Annual Wellness (AWV)  11/20/2022   INFLUENZA VACCINE  02/07/2023 (Originally 06/09/2022)   DEXA SCAN  05/08/2024   DTaP/Tdap/Td (2 - Td or Tdap) 02/05/2028   COLONOSCOPY (Pts 45-459yrInsurance coverage will need to be confirmed)  04/02/2031   Hepatitis C Screening  Completed   HIV Screening  Completed   HPV VACCINES  Aged Out    Health Maintenance  Health Maintenance Due  Topic Date Due   Zoster Vaccines- Shingrix (2 of 2) 11/01/2018   MAMMOGRAM  07/09/2022   COVID-19 Vaccine (6 - 2023-24 season) 07/10/2022   Medicare Annual Wellness (AWV)  11/20/2022    Colorectal cancer screening: Type of screening: Colonoscopy. Completed 04/01/21. Repeat every 10 years  {Mammogram status:21018020}  Bone Density status: Completed 05/08/22. Results reflect: Bone density results: OSTEOPENIA. Repeat every 2 years.  Lung Cancer Screening: (Low Dose CT Chest recommended if Age 64-80ears, 30 pack-year currently smoking OR have quit w/in 15years.) does not qualify.   Lung Cancer Screening Referral: n/a  Additional Screening:  Hepatitis C Screening: does qualify; Completed 07/02/16  Vision Screening: Recommended annual ophthalmology exams for early detection of glaucoma and other disorders of the eye. Is the patient up to date with their annual eye exam?  Yes  Who is the provider or what is the name of the office in which the patient attends annual eye exams? [facility] pt is not established  with a provider, would they like to be referred to a provider to establish care? No .   Dental Screening: Recommended annual  dental exams for proper oral hygiene  Community Resource Referral / Chronic Care Management: CRR required this visit?  {YES/NO:21197}  CCM required this visit?  {YES/NO:21197}     Plan:     I have personally reviewed and noted the following in the patient's chart:   Medical and social history Use of alcohol, tobacco or illicit drugs  Current medications and supplements including opioid prescriptions. {Opioid [phone]} Functional ability and status Nutritional status Physical activity Advanced directives List of other physicians Hospitalizations, surgeries, and ER visits in previous 12 months Vitals Screenings to include cognitive, depression, and falls Referrals and appointments  In addition, I have reviewed and discussed with patient certain preventive protocols, quality metrics, and best practice recommendations. A written personalized care plan for preventive services as well as general preventive health recommendations were provided to patient.     Denman George [place], [place]   [id number]   Due to this being a virtual visit, the after visit summary with patients personalized plan was offered to patient via mail or my-chart. ***Patient declined at this time./ Patient would like to access on my-chart/ per request, patient was mailed a copy of AVS./ Patient preferred to pick up at office at next visit   Nurse Notes: ***

## 2022-12-29 NOTE — Telephone Encounter (Signed)
   CCM RN Visit Note   12/29/22 Name: [name] MRN: [id number]      DOB: [birth year]  Subjective: [name] is a 62 y.o. year old female who is a primary care patient of Ronnie Doss DO. The patient was referred to the Chronic Care Management team for assistance with care management needs subsequent to provider initiation of CCM services and plan of care.      An unsuccessful telephone outreach was attempted today to contact the patient about Chronic Care Management needs.    Plan:Telephone follow up appointment with care management team member scheduled for:  upon care guide rescheduling.  Jacqlyn Larsen RNC, BSN RN Case Manager [place] [phone]

## 2022-12-30 ENCOUNTER — Ambulatory Visit (INDEPENDENT_AMBULATORY_CARE_PROVIDER_SITE_OTHER): Payer: Medicare HMO

## 2022-12-30 VITALS — Ht 64.0 in | Wt 190.0 lb

## 2022-12-30 DIAGNOSIS — Z0000 Encounter for general adult medical examination without abnormal findings: Secondary | ICD-10-CM

## 2023-01-12 ENCOUNTER — Other Ambulatory Visit: Payer: Self-pay | Admitting: Family Medicine

## 2023-01-12 DIAGNOSIS — J0100 Acute maxillary sinusitis, unspecified: Secondary | ICD-10-CM

## 2023-01-12 DIAGNOSIS — F411 Generalized anxiety disorder: Secondary | ICD-10-CM

## 2023-01-17 ENCOUNTER — Other Ambulatory Visit: Payer: Self-pay | Admitting: Family Medicine

## 2023-01-17 DIAGNOSIS — F411 Generalized anxiety disorder: Secondary | ICD-10-CM

## 2023-01-17 DIAGNOSIS — J40 Bronchitis, not specified as acute or chronic: Secondary | ICD-10-CM

## 2023-01-20 ENCOUNTER — Telehealth: Payer: Self-pay

## 2023-01-20 NOTE — Progress Notes (Signed)
  Chronic Care Management Note  01/20/2023 Name: [name] MRN: [id number] DOB: 1961-02-06  [name] is a 62 y.o. year old female who is a primary care patient of [name], DO and is actively engaged with the Chronic Care Management team. I reached out to [name] by phone today to assist with re-scheduling a follow up visit with the RN Case Manager  Follow up plan: Unsuccessful telephone outreach attempt made. A HIPAA compliant phone message was left for the patient providing contact information and requesting a return call.  The care management team will reach out to the patient again over the next 7 days.  If patient returns call to provider office, please advise to call [facility]  at [place], [place], [place] [address] Direct Dial: [phone] [email or web address]

## 2023-02-09 DIAGNOSIS — M47896 Other spondylosis, lumbar region: Secondary | ICD-10-CM | POA: Diagnosis not present

## 2023-02-09 DIAGNOSIS — M7918 Myalgia, other site: Secondary | ICD-10-CM | POA: Diagnosis not present

## 2023-02-09 DIAGNOSIS — Z79891 Long term (current) use of opiate analgesic: Secondary | ICD-10-CM | POA: Diagnosis not present

## 2023-02-09 DIAGNOSIS — Z79899 Other long term (current) drug therapy: Secondary | ICD-10-CM | POA: Diagnosis not present

## 2023-02-09 DIAGNOSIS — M5106 Intervertebral disc disorders with myelopathy, lumbar region: Secondary | ICD-10-CM | POA: Diagnosis not present

## 2023-02-09 DIAGNOSIS — G894 Chronic pain syndrome: Secondary | ICD-10-CM | POA: Diagnosis not present

## 2023-02-10 ENCOUNTER — Encounter: Payer: Self-pay | Admitting: Orthopedic Surgery

## 2023-02-13 ENCOUNTER — Other Ambulatory Visit: Payer: Self-pay | Admitting: Orthopedic Surgery

## 2023-02-13 DIAGNOSIS — M47816 Spondylosis without myelopathy or radiculopathy, lumbar region: Secondary | ICD-10-CM

## 2023-02-13 DIAGNOSIS — M5416 Radiculopathy, lumbar region: Secondary | ICD-10-CM

## 2023-02-24 ENCOUNTER — Ambulatory Visit (INDEPENDENT_AMBULATORY_CARE_PROVIDER_SITE_OTHER): Payer: Medicare HMO | Admitting: *Deleted

## 2023-02-24 DIAGNOSIS — I10 Essential (primary) hypertension: Secondary | ICD-10-CM

## 2023-02-24 DIAGNOSIS — E782 Mixed hyperlipidemia: Secondary | ICD-10-CM

## 2023-02-24 NOTE — Patient Instructions (Signed)
Please call the care guide team at [phone] if you need to cancel or reschedule your appointment.   If you are experiencing a Mental Health or Behavioral Health Crisis or need someone to talk to, please call the Suicide and Crisis Lifeline: 988 call the [place] National Suicide Prevention Lifeline: 305 346 1775 or TTY: 3040191102 TTY 573-067-2337) to talk to a trained counselor call 1-800-273-TALK (toll free, 24 hour hotline) go to [facility] [address], [place] [phone]) call the [facility]: [phone] call 911   Following is a copy of the CCM Program Consent:  CCM service includes personalized support from designated clinical staff supervised by the physician, including individualized plan of care and coordination with other care providers 24/7 contact phone numbers for assistance for urgent and routine care needs. Service will only be billed when office clinical staff spend 20 minutes or more in a month to coordinate care. Only one practitioner may furnish and bill the service in a calendar month. The patient may stop CCM services at amy time (effective at the end of the month) by phone call to the office staff. The patient will be responsible for cost sharing (co-pay) or up to 20% of the service fee (after annual deductible is met)  Following is a copy of your full provider care plan:   Goals Addressed             This Visit's Progress    CCM (HYPERLIPIDEMIA) EXPECTED OUTCOME: MONITOR, SELF-MANAGE AND REDUCE SYMPTOMS OF HYPERLIPIDEMIA       Current Barriers:  Knowledge Deficits related to Hyperlipidemia management Chronic Disease Management support and education needs related to Hyperlipidemia and diet No Advanced Directives in place- pt declines Patient reports she tries to adhere to heart healthy diet Patient reports she has all medications on hand including nexletol Patient reports she has upcoming  myelogram in April because of back issues  Planned Interventions: Provider established cholesterol goals reviewed; Counseled on importance of regular laboratory monitoring as prescribed; Reviewed role and benefits of statin for ASCVD risk reduction; Discussed strategies to manage statin-induced myalgias; Reviewed importance of limiting foods high in cholesterol; Pain assessment completed  Symptom Management: Take medications as prescribed   Attend all scheduled provider appointments Call pharmacy for medication refills 3-7 days in advance of running out of medications Attend church or other social activities Perform all self care activities independently  Perform IADL's (shopping, preparing meals, housekeeping, managing finances) independently Call provider office for new concerns or questions  - call for medicine refill 2 or 3 days before it runs out - take all medications exactly as prescribed - call doctor with any symptoms you believe are related to your medicine - call doctor when you experience any new symptoms - go to all doctor appointments as scheduled - adhere to prescribed diet: heart healthy  Follow Up Plan: Telephone follow up appointment with care management team member scheduled for:  05/24/23 at 215 pm       CCM (HYPERTENSION) EXPECTED OUTCOME: MONITOR, SELF-MANAGE AND REDUCE SYMPTOMS OF HYPERTENSION       Current Barriers:  Knowledge Deficits related to Hypertension management Chronic Disease Management support and education needs related to Hypertensions and diet No Advanced Directives in place- pt declines Patient reports she lives with spouse, is independent in all apsects of her care, is unable to exercise due to arthritis/ chronic pain issues. Patient states she checks blood pressure "when I feel it might be high" Patient reports she  tries to adhere to heart healthy diet, no new concerns reported today  Planned Interventions: Evaluation of current treatment  plan related to hypertension self management and patient's adherence to plan as established by provider;   Provided education to patient re: stroke prevention, s/s of heart attack and stroke; Reviewed medications with patient and discussed importance of compliance;  Counseled on the importance of exercise goals with target of 150 minutes per week Advised patient, providing education and rationale, to monitor blood pressure daily and record, calling PCP for findings outside established parameters;  Reinforced low sodium diet  Symptom Management: Take medications as prescribed   Attend all scheduled provider appointments Call pharmacy for medication refills 3-7 days in advance of running out of medications Attend church or other social activities Perform all self care activities independently  Perform IADL's (shopping, preparing meals, housekeeping, managing finances) independently Call provider office for new concerns or questions  check blood pressure 3 times per week choose a place to take my blood pressure (home, clinic or office, retail store) write blood pressure results in a log or diary learn about high blood pressure keep a blood pressure log take blood pressure log to all doctor appointments call doctor for signs and symptoms of high blood pressure develop an action plan for high blood pressure keep all doctor appointments take medications for blood pressure exactly as prescribed report new symptoms to your doctor eat more whole grains, fruits and vegetables, lean meats and healthy fats Follow low sodium diet- read food labels for sodium content  Follow Up Plan: Telephone follow up appointment with care management team member scheduled for:  05/24/23 at 215 pm          Patient verbalizes understanding of instructions and care plan provided today and agrees to view in MyChart. Active MyChart status and patient understanding of how to access instructions and care plan via  MyChart confirmed with patient.  Telephone follow up appointment with care management team member scheduled for:  05/24/23 at 215 pm

## 2023-02-24 NOTE — Chronic Care Management (AMB) (Signed)
Chronic Care Management   CCM RN Visit Note  02/24/2023 Name: [name] MRN: [id number] DOB: 05/01/1961  Subjective: [name] is a 62 y.o. year old female who is a primary care patient of Raliegh Ip, DO. The patient was referred to the Chronic Care Management team for assistance with care management needs subsequent to provider initiation of CCM services and plan of care.    Today's Visit:  Engaged with patient by telephone for follow up visit.        Goals Addressed             This Visit's Progress    CCM (HYPERLIPIDEMIA) EXPECTED OUTCOME: MONITOR, SELF-MANAGE AND REDUCE SYMPTOMS OF HYPERLIPIDEMIA       Current Barriers:  Knowledge Deficits related to Hyperlipidemia management Chronic Disease Management support and education needs related to Hyperlipidemia and diet No Advanced Directives in place- pt declines Patient reports she tries to adhere to heart healthy diet Patient reports she has all medications on hand including nexletol Patient reports she has upcoming myelogram in April because of back issues  Planned Interventions: Provider established cholesterol goals reviewed; Counseled on importance of regular laboratory monitoring as prescribed; Reviewed role and benefits of statin for ASCVD risk reduction; Discussed strategies to manage statin-induced myalgias; Reviewed importance of limiting foods high in cholesterol; Pain assessment completed  Symptom Management: Take medications as prescribed   Attend all scheduled provider appointments Call pharmacy for medication refills 3-7 days in advance of running out of medications Attend church or other social activities Perform all self care activities independently  Perform IADL's (shopping, preparing meals, housekeeping, managing finances) independently Call provider office for new concerns or questions  - call for medicine refill 2 or 3 days before it runs out - take all medications exactly as  prescribed - call doctor with any symptoms you believe are related to your medicine - call doctor when you experience any new symptoms - go to all doctor appointments as scheduled - adhere to prescribed diet: heart healthy  Follow Up Plan: Telephone follow up appointment with care management team member scheduled for:  05/24/23 at 215 pm       CCM (HYPERTENSION) EXPECTED OUTCOME: MONITOR, SELF-MANAGE AND REDUCE SYMPTOMS OF HYPERTENSION       Current Barriers:  Knowledge Deficits related to Hypertension management Chronic Disease Management support and education needs related to Hypertensions and diet No Advanced Directives in place- pt declines Patient reports she lives with spouse, is independent in all apsects of her care, is unable to exercise due to arthritis/ chronic pain issues. Patient states she checks blood pressure "when I feel it might be high" Patient reports she tries to adhere to heart healthy diet, no new concerns reported today  Planned Interventions: Evaluation of current treatment plan related to hypertension self management and patient's adherence to plan as established by provider;   Provided education to patient re: stroke prevention, s/s of heart attack and stroke; Reviewed medications with patient and discussed importance of compliance;  Counseled on the importance of exercise goals with target of 150 minutes per week Advised patient, providing education and rationale, to monitor blood pressure daily and record, calling PCP for findings outside established parameters;  Reinforced low sodium diet  Symptom Management: Take medications as prescribed   Attend all scheduled provider appointments Call pharmacy for medication refills 3-7 days in advance of running out of medications Attend church or other social activities Perform all self care activities independently  Perform  IADL's (shopping, preparing meals, housekeeping, managing finances) independently Call  provider office for new concerns or questions  check blood pressure 3 times per week choose a place to take my blood pressure (home, clinic or office, retail store) write blood pressure results in a log or diary learn about high blood pressure keep a blood pressure log take blood pressure log to all doctor appointments call doctor for signs and symptoms of high blood pressure develop an action plan for high blood pressure keep all doctor appointments take medications for blood pressure exactly as prescribed report new symptoms to your doctor eat more whole grains, fruits and vegetables, lean meats and healthy fats Follow low sodium diet- read food labels for sodium content  Follow Up Plan: Telephone follow up appointment with care management team member scheduled for:  05/24/23 at 215 pm          Plan:Telephone follow up appointment with care management team member scheduled for:  05/24/23 at 215 pm  Irving Shows [facility], BSN RN Case Manager [facility] [phone]

## 2023-03-03 ENCOUNTER — Ambulatory Visit
Admission: RE | Admit: 2023-03-03 | Discharge: 2023-03-03 | Disposition: A | Discharge status: Home / Self Care | Payer: Medicare HMO | Source: Ambulatory Visit | Attending: Orthopedic Surgery | Admitting: Orthopedic Surgery

## 2023-03-03 DIAGNOSIS — M5416 Radiculopathy, lumbar region: Secondary | ICD-10-CM

## 2023-03-03 DIAGNOSIS — M5116 Intervertebral disc disorders with radiculopathy, lumbar region: Secondary | ICD-10-CM | POA: Diagnosis not present

## 2023-03-03 DIAGNOSIS — M47816 Spondylosis without myelopathy or radiculopathy, lumbar region: Secondary | ICD-10-CM

## 2023-03-03 DIAGNOSIS — Z981 Arthrodesis status: Secondary | ICD-10-CM | POA: Diagnosis not present

## 2023-03-03 MED ORDER — ONDANSETRON HCL 4 MG/2ML IJ SOLN: 4.0000 mg | Freq: Once | INTRAMUSCULAR | Status: DC | PRN

## 2023-03-03 MED ORDER — IOPAMIDOL (ISOVUE-M 200) INJECTION 41%
20.0000 mL | Freq: Once | INTRAMUSCULAR | Status: AC
  Administered 2023-03-03: 20 mL via INTRATHECAL

## 2023-03-03 MED ORDER — DIAZEPAM 5 MG PO TABS: 10.0000 mg | ORAL_TABLET | Freq: Once | ORAL | Status: DC

## 2023-03-03 MED ORDER — MEPERIDINE HCL 50 MG/ML IJ SOLN: 50.0000 mg | Freq: Once | INTRAMUSCULAR | Status: DC | PRN

## 2023-03-03 NOTE — Discharge Instructions (Signed)

## 2023-03-04 DIAGNOSIS — G894 Chronic pain syndrome: Secondary | ICD-10-CM | POA: Diagnosis not present

## 2023-03-04 DIAGNOSIS — M5106 Intervertebral disc disorders with myelopathy, lumbar region: Secondary | ICD-10-CM | POA: Diagnosis not present

## 2023-03-04 DIAGNOSIS — M47896 Other spondylosis, lumbar region: Secondary | ICD-10-CM | POA: Diagnosis not present

## 2023-03-04 DIAGNOSIS — M7918 Myalgia, other site: Secondary | ICD-10-CM | POA: Diagnosis not present

## 2023-03-09 DIAGNOSIS — I10 Essential (primary) hypertension: Secondary | ICD-10-CM

## 2023-03-09 DIAGNOSIS — E785 Hyperlipidemia, unspecified: Secondary | ICD-10-CM | POA: Diagnosis not present

## 2023-03-09 DIAGNOSIS — F17210 Nicotine dependence, cigarettes, uncomplicated: Secondary | ICD-10-CM | POA: Diagnosis not present

## 2023-03-14 ENCOUNTER — Other Ambulatory Visit: Payer: Self-pay | Admitting: Family Medicine

## 2023-03-14 DIAGNOSIS — J0100 Acute maxillary sinusitis, unspecified: Secondary | ICD-10-CM

## 2023-03-22 ENCOUNTER — Encounter: Payer: Self-pay | Admitting: Family Medicine

## 2023-03-22 DIAGNOSIS — I10 Essential (primary) hypertension: Secondary | ICD-10-CM

## 2023-03-22 MED ORDER — ATENOLOL 50 MG PO TABS: 50.0000 mg | ORAL_TABLET | Freq: Two times a day (BID) | ORAL | 3 refills | Status: DC

## 2023-03-22 MED ORDER — HYDRALAZINE HCL 50 MG PO TABS: 50.0000 mg | ORAL_TABLET | Freq: Two times a day (BID) | ORAL | 3 refills | Status: DC

## 2023-03-25 DIAGNOSIS — M4716 Other spondylosis with myelopathy, lumbar region: Secondary | ICD-10-CM | POA: Diagnosis not present

## 2023-03-25 DIAGNOSIS — M961 Postlaminectomy syndrome, not elsewhere classified: Secondary | ICD-10-CM | POA: Diagnosis not present

## 2023-03-25 DIAGNOSIS — G894 Chronic pain syndrome: Secondary | ICD-10-CM | POA: Diagnosis not present

## 2023-03-25 DIAGNOSIS — Z981 Arthrodesis status: Secondary | ICD-10-CM | POA: Diagnosis not present

## 2023-03-26 ENCOUNTER — Other Ambulatory Visit: Payer: Self-pay | Admitting: Orthopaedic Surgery

## 2023-03-26 DIAGNOSIS — M961 Postlaminectomy syndrome, not elsewhere classified: Secondary | ICD-10-CM

## 2023-03-26 DIAGNOSIS — M4716 Other spondylosis with myelopathy, lumbar region: Secondary | ICD-10-CM

## 2023-03-26 DIAGNOSIS — Z981 Arthrodesis status: Secondary | ICD-10-CM

## 2023-04-04 ENCOUNTER — Other Ambulatory Visit: Payer: Self-pay | Admitting: Family Medicine

## 2023-04-04 DIAGNOSIS — F411 Generalized anxiety disorder: Secondary | ICD-10-CM

## 2023-04-06 ENCOUNTER — Ambulatory Visit
Admission: RE | Admit: 2023-04-06 | Discharge: 2023-04-06 | Disposition: A | Discharge status: Home / Self Care | Payer: Medicare HMO | Source: Ambulatory Visit | Attending: Orthopaedic Surgery | Admitting: Orthopaedic Surgery

## 2023-04-06 DIAGNOSIS — M4716 Other spondylosis with myelopathy, lumbar region: Secondary | ICD-10-CM

## 2023-04-06 DIAGNOSIS — Z981 Arthrodesis status: Secondary | ICD-10-CM

## 2023-04-06 DIAGNOSIS — M961 Postlaminectomy syndrome, not elsewhere classified: Secondary | ICD-10-CM

## 2023-04-06 DIAGNOSIS — M545 Low back pain, unspecified: Secondary | ICD-10-CM | POA: Diagnosis not present

## 2023-04-06 DIAGNOSIS — M79651 Pain in right thigh: Secondary | ICD-10-CM | POA: Diagnosis not present

## 2023-04-06 DIAGNOSIS — R52 Pain, unspecified: Secondary | ICD-10-CM | POA: Diagnosis not present

## 2023-04-06 MED ORDER — METHYLPREDNISOLONE ACETATE 40 MG/ML INJ SUSP (RADIOLOG
80.0000 mg | Freq: Once | INTRAMUSCULAR | Status: AC
  Administered 2023-04-06: 80 mg via INTRA_ARTICULAR

## 2023-04-06 MED ORDER — IOPAMIDOL (ISOVUE-M 200) INJECTION 41%
1.0000 mL | Freq: Once | INTRAMUSCULAR | Status: AC
  Administered 2023-04-06: 1 mL via INTRA_ARTICULAR

## 2023-04-06 NOTE — Discharge Instructions (Signed)

## 2023-04-09 ENCOUNTER — Other Ambulatory Visit: Payer: Self-pay | Admitting: Family Medicine

## 2023-04-09 DIAGNOSIS — F331 Major depressive disorder, recurrent, moderate: Secondary | ICD-10-CM

## 2023-04-09 DIAGNOSIS — F411 Generalized anxiety disorder: Secondary | ICD-10-CM

## 2023-04-15 ENCOUNTER — Other Ambulatory Visit: Payer: Self-pay | Admitting: Family Medicine

## 2023-04-15 DIAGNOSIS — F411 Generalized anxiety disorder: Secondary | ICD-10-CM

## 2023-04-19 MED ORDER — LISINOPRIL 10 MG PO TABS: 5.0000 mg | ORAL_TABLET | Freq: Every day | ORAL | 3 refills | Status: DC

## 2023-04-19 NOTE — Addendum Note (Signed)
Addended by: Raliegh Ip on: 04/19/2023 07:43 AM   Modules accepted: Orders

## 2023-04-27 NOTE — Patient Instructions (Signed)
Our records indicate that you are due for your screening mammogram.  Please call the imaging center that does your yearly mammograms to make an appointment for a mammogram at your earliest convenience. Our office also has a mobile unit through the [facility] that comes to our location. Please call our office if you would like to make an appointment.   

## 2023-04-29 ENCOUNTER — Other Ambulatory Visit: Payer: Self-pay | Admitting: Family Medicine

## 2023-04-29 ENCOUNTER — Other Ambulatory Visit: Payer: Medicare HMO

## 2023-04-29 DIAGNOSIS — F411 Generalized anxiety disorder: Secondary | ICD-10-CM

## 2023-05-03 ENCOUNTER — Other Ambulatory Visit: Payer: Self-pay | Admitting: Family Medicine

## 2023-05-03 DIAGNOSIS — F411 Generalized anxiety disorder: Secondary | ICD-10-CM

## 2023-05-05 ENCOUNTER — Ambulatory Visit (INDEPENDENT_AMBULATORY_CARE_PROVIDER_SITE_OTHER): Payer: Medicare HMO | Admitting: Family Medicine

## 2023-05-05 ENCOUNTER — Encounter: Payer: Self-pay | Admitting: Family Medicine

## 2023-05-05 VITALS — BP 155/95 | HR 74 | Temp 97.4°F | Resp 20 | Ht 64.0 in | Wt 176.0 lb

## 2023-05-05 DIAGNOSIS — F17200 Nicotine dependence, unspecified, uncomplicated: Secondary | ICD-10-CM | POA: Diagnosis not present

## 2023-05-05 DIAGNOSIS — F5104 Psychophysiologic insomnia: Secondary | ICD-10-CM

## 2023-05-05 DIAGNOSIS — N62 Hypertrophy of breast: Secondary | ICD-10-CM | POA: Diagnosis not present

## 2023-05-05 DIAGNOSIS — I10 Essential (primary) hypertension: Secondary | ICD-10-CM

## 2023-05-05 DIAGNOSIS — Z5986 Financial insecurity: Secondary | ICD-10-CM | POA: Diagnosis not present

## 2023-05-05 DIAGNOSIS — F331 Major depressive disorder, recurrent, moderate: Secondary | ICD-10-CM

## 2023-05-05 DIAGNOSIS — Z59869 Financial insecurity, unspecified: Secondary | ICD-10-CM

## 2023-05-05 DIAGNOSIS — F411 Generalized anxiety disorder: Secondary | ICD-10-CM

## 2023-05-05 DIAGNOSIS — D75839 Thrombocytosis, unspecified: Secondary | ICD-10-CM

## 2023-05-05 DIAGNOSIS — F430 Acute stress reaction: Secondary | ICD-10-CM

## 2023-05-05 DIAGNOSIS — Z635 Disruption of family by separation and divorce: Secondary | ICD-10-CM | POA: Diagnosis not present

## 2023-05-05 DIAGNOSIS — K5909 Other constipation: Secondary | ICD-10-CM

## 2023-05-05 MED ORDER — BUSPIRONE HCL 5 MG PO TABS
ORAL_TABLET | ORAL | 3 refills | Status: DC
Start: 2023-05-05 — End: 2024-04-17

## 2023-05-05 MED ORDER — CITALOPRAM HYDROBROMIDE 40 MG PO TABS
ORAL_TABLET | ORAL | 3 refills | Status: DC
Start: 2023-05-05 — End: 2024-03-09

## 2023-05-05 MED ORDER — HYDROCHLOROTHIAZIDE 25 MG PO TABS: 25.0000 mg | ORAL_TABLET | Freq: Every day | ORAL | 3 refills | Status: DC

## 2023-05-05 MED ORDER — LINACLOTIDE 290 MCG PO CAPS: 290.0000 ug | ORAL_CAPSULE | Freq: Every day | ORAL | 3 refills | Status: DC

## 2023-05-05 MED ORDER — TRAZODONE HCL 100 MG PO TABS: 150.0000 mg | ORAL_TABLET | Freq: Every evening | ORAL | 3 refills | Status: DC | PRN

## 2023-05-05 MED ORDER — BUSPIRONE HCL 15 MG PO TABS: 15.0000 mg | ORAL_TABLET | Freq: Three times a day (TID) | ORAL | 3 refills | Status: DC

## 2023-05-05 NOTE — Progress Notes (Signed)
Subjective: CC: Chronic follow-up PCP: Raliegh Ip, DO WUJ:[id number] [name] is Betty 62 y.o. female presenting to clinic today for:  1.  Acute stress reaction Patient reports that she just recently found out that her spouse was cheating on her with their next-door neighbor.  She subsequently has kicked him out of the house and they have been separated for the last couple of weeks.  She is really crushed over this.  She apologizes profusely during today's visit because this is "the first time that she is cried about this".  She is really trying to navigate this big change independently but is unfortunately feeling Betty lot of financial stressors related to the separation.  She has been utilizing food banks to help with groceries.  Likely, the house is in her name so that will not be Betty point of content.  She has considered seeking counseling with her church but worries about gossip being spread around the church.  There is been some issues going on with her twin sister talking about her personal issues.  She is compliant with all medications.  She did not get the buspirone 5 mg recently because the prescription ran out but does need refills on everything if possible  2.  Preventive care Needs screening mammogram, lung cancer screening  3.  Macromastia Patient reports that she would like to discuss with the specialist about breast reduction.  She reports that her breasts are very heavy, cause upper back pain and indentation along her shoulders.  She frequently gets intertriginous rashes underneath her breast.  Asking for my recommendations   ROS: Per HPI  Allergies  Allergen Reactions   Doxycycline     Other Reaction(s): GI Intolerance   Nsaids Nausea Only    Abdominal pain   Tolmetin     Other Reaction(s): GI Intolerance  Abdominal pain   Past Medical History:  Diagnosis Date   Abdominal pain    Abscess, intra-abdominal, postoperative 12/31/2020   Acute blood loss anemia (ABLA)  05/04/2021   Acute ITP (HCC) 05/08/2021   Allergy    seasonal   Anemia 2021   Anxiety    Arthritis    Aspiration pneumonia of both lower lobes due to gastric secretions (HCC) 12/22/2020   Blood in stool    Blood transfusion without reported diagnosis 12/21/2020   Breast changes, fibrocystic    Bruises easily    Cardiomegaly 2018   Clotting disorder (HCC)     itp , none since 1976, no current hematologist   Colon perforation due to stercoral ulcer with E. coli fecal peritonitis    Constipation    DDD (degenerative disc disease), cervical    with lumbar issues   Depression    E. coli sepsis -due to fecal peritonitis 12/23/2020   Generalized headaches    GERD (gastroesophageal reflux disease)    Glaucoma    Hemorrhoid    Hyperlipidemia    Hypertension    Idiopathic thrombocytopenia (HCC)    as child   Leg swelling    both ankles   Liver lesion    Nasal congestion    Nausea & vomiting    Neuromuscular disorder (HCC)    back  injury   Neuromuscular disorder (HCC)    3 neck surgeries,neck fusion.pin,plates,screws   Osteoporosis    Panic attacks    pt also  has germaphobia   Rectal bleeding    Seizures (HCC) 1998   stress induced, one time, none since, no seizure meds  Seizures (HCC)    Stated she had 2 weeks ago,it was like Betty transient,stare.04/01/21 pt. denies   Spleen enlarged 1976, none since   r/t idiopathic thrombocytopenia   Stercoral ulcer of large intestine 12/21/2020   Trouble swallowing    Unintentional weight loss    Weakness    weakness varies    Current Outpatient Medications:    albuterol (VENTOLIN HFA) 108 (90 Base) MCG/ACT inhaler, Inhale 2 puffs into the lungs every 6 (six) hours as needed for wheezing or shortness of breath., Disp: 8 g, Rfl: 0   amitriptyline (ELAVIL) 25 MG tablet, Take 25-50 mg by mouth See admin instructions. Take 50 mg by mouth at bedtime, may take 25-50 mg during the day if needed for pain, Disp: , Rfl:    atenolol (TENORMIN) 50  MG tablet, Take 1 tablet (50 mg total) by mouth 2 (two) times daily., Disp: 180 tablet, Rfl: 3   Bempedoic Acid (NEXLETOL) 180 MG TABS, Take 180 mg by mouth daily., Disp: 90 tablet, Rfl: 3   busPIRone (BUSPAR) 15 MG tablet, Take 1 tablet (15 mg total) by mouth 3 (three) times daily., Disp: 90 tablet, Rfl: 0   busPIRone (BUSPAR) 5 MG tablet, TAKE 1 TABLET BY MOUTH 3 TIMES DAILY. TAKE 1 TABLET WITH THE 15MG  TO EQUAL 20MG ., Disp: 270 tablet, Rfl: 3   cetirizine (ZYRTEC) 10 MG tablet, Take 1 tablet (10 mg total) by mouth daily., Disp: 90 tablet, Rfl: 3   citalopram (CELEXA) 40 MG tablet, TAKE 1 TABLET BY MOUTH DAILY FOR MOOD, Disp: 90 tablet, Rfl: 0   famotidine (PEPCID) 20 MG tablet, Take 1 tablet (20 mg total) by mouth daily as needed for heartburn or indigestion., Disp: 30 tablet, Rfl: 6   fluticasone (FLONASE) 50 MCG/ACT nasal spray, SPRAY 2 SPRAYS INTO EACH NOSTRIL EVERY DAY, Disp: 48 mL, Rfl: 0   hydrALAZINE (APRESOLINE) 50 MG tablet, Take 1 tablet (50 mg total) by mouth in the morning and at bedtime., Disp: 180 tablet, Rfl: 3   hydrochlorothiazide (HYDRODIURIL) 25 MG tablet, Take 1 tablet (25 mg total) by mouth daily., Disp: 90 tablet, Rfl: 3   latanoprost (XALATAN) 0.005 % ophthalmic solution, Place 1 drop into both eyes at bedtime., Disp: , Rfl:    linaclotide (LINZESS) 290 MCG CAPS capsule, Take 1 capsule (290 mcg total) by mouth daily before breakfast., Disp: 90 capsule, Rfl: 3   lisinopril (ZESTRIL) 10 MG tablet, Take 0.5-1 tablets (5-10 mg total) by mouth daily. As directed, Disp: 90 tablet, Rfl: 3   naloxone (NARCAN) nasal spray 4 mg/0.1 mL, SMARTSIG:Both Nares, Disp: , Rfl:    nitroGLYCERIN (NITROSTAT) 0.4 MG SL tablet, Place 1 tablet (0.4 mg total) under the tongue every 5 (five) minutes as needed for chest pain., Disp: 25 tablet, Rfl: 0   ondansetron (ZOFRAN) 4 MG tablet, Take 1 tablet (4 mg total) by mouth every 8 (eight) hours as needed for nausea or vomiting., Disp: 20 tablet, Rfl: 0    Oxycodone HCl 10 MG TABS, Take by mouth., Disp: , Rfl:    oxyCODONE-acetaminophen (PERCOCET) 10-325 MG tablet, Take 1 tablet by mouth every 4 (four) hours as needed for pain., Disp: 20 tablet, Rfl: 0   polyethylene glycol powder (MIRALAX) 17 GM/SCOOP powder, Take 17 g by mouth in the morning and at bedtime., Disp: 225 g, Rfl: PRN   tiZANidine (ZANAFLEX) 4 MG tablet, Take 1 tablet (4 mg total) by mouth every 8 (eight) hours as needed for muscle spasms., Disp: 30  tablet, Rfl: 1   traZODone (DESYREL) 100 MG tablet, Take 1.5 tablets (150 mg total) by mouth at bedtime as needed. for sleep, Disp: 135 tablet, Rfl: 3 Social History   Socioeconomic History   Marital status: Married    Spouse name: Casimiro Needle   Number of children: 3   Years of education: 14   Highest education level: Associate degree: academic program  Occupational History   Occupation: Sports coach: UNEMPLOYED  Tobacco Use   Smoking status: Every Day    Packs/day: 1.00    Years: 10.00    Additional pack years: 0.00    Total pack years: 10.00    Types: Cigarettes   Smokeless tobacco: Never   Tobacco comments:    Started 2007  Vaping Use   Vaping Use: Former   Quit date: 09/07/2016   Devices: only used for about Betty month  Substance and Sexual Activity   Alcohol use: No   Drug use: Yes    Types: Marijuana   Sexual activity: Not Currently  Other Topics Concern   Not on file  Social History Narrative   She lives with her boyfriend.  She used to smoke, quit May 2013, she is not drinking. She has 3 children   Social Determinants of Health   Financial Resource Strain: Low Risk  (12/30/2022)   Overall Financial Resource Strain (CARDIA)    Difficulty of Paying Living Expenses: Not hard at all  Recent Concern: Financial Resource Strain - Medium Risk (10/27/2022)   Overall Financial Resource Strain (CARDIA)    Difficulty of Paying Living Expenses: Somewhat hard  Food Insecurity: No Food Insecurity (12/30/2022)   Hunger  Vital Sign    Worried About Running Out of Food in the Last Year: Never true    Ran Out of Food in the Last Year: Never true  Recent Concern: Food Insecurity - Food Insecurity Present (10/27/2022)   Hunger Vital Sign    Worried About Running Out of Food in the Last Year: Sometimes true    Ran Out of Food in the Last Year: Sometimes true  Transportation Needs: No Transportation Needs (12/30/2022)   PRAPARE - Administrator, Civil Service (Medical): No    Lack of Transportation (Non-Medical): No  Physical Activity: Inactive (12/30/2022)   Exercise Vital Sign    Days of Exercise per Week: 0 days    Minutes of Exercise per Session: 0 min  Stress: No Stress Concern Present (12/30/2022)   [employer] of Occupational Health - Occupational Stress Questionnaire    Feeling of Stress : Not at all  Social Connections: Socially Integrated (12/30/2022)   Social Connection and Isolation Panel [NHANES]    Frequency of Communication with Friends and Family: More than three times Betty week    Frequency of Social Gatherings with Friends and Family: Once Betty week    Attends Religious Services: 1 to 4 times per year    Active Member of [employer] or Organizations: Yes    Attends Engineer, structural: More than 4 times per year    Marital Status: Married  Catering manager Violence: Not At Risk (12/30/2022)   Humiliation, Afraid, Rape, and Kick questionnaire    Fear of Current or Ex-Partner: No    Emotionally Abused: No    Physically Abused: No    Sexually Abused: No   Family History  Problem Relation Age of Onset   Hypertension Mother    Heart disease Mother    Anxiety disorder  Mother    Depression Mother    Lung cancer Father    Cancer Father        lung   Alcohol abuse Father    Diabetes Sister    Bipolar disorder Sister    Anxiety disorder Sister    Heart disease Sister        Congestive Heart Failure   Hypertension Sister    Depression Sister    Heart disease Maternal  Grandmother    Colon cancer Paternal Grandmother    Cancer Paternal Grandmother        colon   Arthritis Paternal Uncle    Esophageal cancer Neg Hx    Rectal cancer Neg Hx    Stomach cancer Neg Hx    Breast cancer Neg Hx     Objective: Office vital signs reviewed. BP (!) 155/95   Pulse 74   Temp (!) 97.4 F (36.3 C) (Oral)   Resp 20   Ht 5\' 4"  (1.626 m)   Wt 176 lb (79.8 kg)   SpO2 93%   BMI 30.21 kg/m   Physical Examination:  General: Awake, alert, tearful HEENT: sclera injected. MMM Cardio: regular rate and rhythm, S1S2 heard, no murmurs appreciated Pulm: Globally decreased breath sounds with normal work of breathing on room air.  No observed coughing Chest: Macromastia present.  There is clear indentations and postinflammatory hyperpigmentation on bilateral shoulders. MSK: Thoracic spine with increased kyphotic curvature Psych: Very tearful, upset when she is talking about recent happenings  Assessment/ Plan: 62 y.o. female   Stress reaction  Separated from spouse - Plan: Ambulatory referral to Integrated Behavioral Health  Financial insecurity - Plan: Ambulatory referral to Social Work  GAD (generalized anxiety disorder) - Plan: busPIRone (BUSPAR) 15 MG tablet, busPIRone (BUSPAR) 5 MG tablet, citalopram (CELEXA) 40 MG tablet, Ambulatory referral to Integrated Behavioral Health  Moderate episode of recurrent major depressive disorder (HCC) - Plan: citalopram (CELEXA) 40 MG tablet, Ambulatory referral to Integrated Behavioral Health  Psychophysiological insomnia - Plan: traZODone (DESYREL) 100 MG tablet  Macromastia - Plan: Ambulatory referral to Plastic Surgery  Essential hypertension - Plan: hydrochlorothiazide (HYDRODIURIL) 25 MG tablet  Thrombocytosis - Plan: CMP14+EGFR, CBC  Tobacco use disorder - Plan: Ambulatory Referral Lung Cancer Screening  Pulmonary  Chronic constipation - Plan: linaclotide (LINZESS) 290 MCG CAPS capsule  She certainly is  having Betty stressful time.  I have referred her to integrated behavioral health here for counseling services.  I renewed her buspirone, Celexa.  Given chronic opioid use for chronic pain management I do not think that utilization of benzodiazepine is appropriate and she of course is not even asking for this as she has successfully tapered from this Betty while back.  I have referred her to plastic surgery for discussion of treatment for macromastia.  She certainly appears to have complications related to heavy breasts I think that she would be appropriate for reduction.  Blood pressure is not controlled but she was quite stressed out during her visit so I am not going to make any medication changes but I would like to see her back in about 6 weeks for reevaluation and to make sure that she is doing okay  I have referred her to [facility] pulmonary for lung cancer screening.  She continues to actively smoke with no plans of stopping given recent events  Constipation is chronic and stable.  Linzess renewed though we did not discuss this at length today   No orders of the defined types  were placed in this encounter.  No orders of the defined types were placed in this encounter.    [name], DO [place] [phone]

## 2023-05-06 ENCOUNTER — Other Ambulatory Visit: Payer: Self-pay | Admitting: Family Medicine

## 2023-05-06 DIAGNOSIS — I10 Essential (primary) hypertension: Secondary | ICD-10-CM

## 2023-05-06 DIAGNOSIS — G894 Chronic pain syndrome: Secondary | ICD-10-CM | POA: Diagnosis not present

## 2023-05-06 DIAGNOSIS — M961 Postlaminectomy syndrome, not elsewhere classified: Secondary | ICD-10-CM | POA: Diagnosis not present

## 2023-05-06 LAB — CMP14+EGFR
ALT: 41 IU/L — ABNORMAL HIGH (ref 0–32)
AST: 31 IU/L (ref 0–40)
Albumin: 4.3 g/dL (ref 3.9–4.9)
Alkaline Phosphatase: 177 IU/L — ABNORMAL HIGH (ref 44–121)
BUN/Creatinine Ratio: 22 (ref 12–28)
BUN: 16 mg/dL (ref 8–27)
Bilirubin Total: 0.3 mg/dL (ref 0.0–1.2)
CO2: 22 mmol/L (ref 20–29)
Calcium: 9.9 mg/dL (ref 8.7–10.3)
Chloride: 103 mmol/L (ref 96–106)
Creatinine, Ser: 0.72 mg/dL (ref 0.57–1.00)
Globulin, Total: 2.7 g/dL (ref 1.5–4.5)
Glucose: 103 mg/dL — ABNORMAL HIGH (ref 70–99)
Potassium: 4.3 mmol/L (ref 3.5–5.2)
Sodium: 141 mmol/L (ref 134–144)
Total Protein: 7 g/dL (ref 6.0–8.5)
eGFR: 94 mL/min/{1.73_m2} (ref >59)

## 2023-05-06 LAB — CBC
Hematocrit: 45.9 % (ref 34.0–46.6)
Hemoglobin: 15.7 g/dL (ref 11.1–15.9)
MCH: 29.6 pg (ref 26.6–33.0)
MCHC: 34.2 g/dL (ref 31.5–35.7)
MCV: 87 fL (ref 79–97)
Platelets: 313 10*3/uL (ref 150–450)
RBC: 5.3 x10E6/uL — ABNORMAL HIGH (ref 3.77–5.28)
RDW: 12.6 % (ref 11.7–15.4)
WBC: 10.1 10*3/uL (ref 3.4–10.8)

## 2023-05-12 ENCOUNTER — Other Ambulatory Visit: Payer: Self-pay | Admitting: Family Medicine

## 2023-05-12 DIAGNOSIS — J0100 Acute maxillary sinusitis, unspecified: Secondary | ICD-10-CM

## 2023-05-19 ENCOUNTER — Other Ambulatory Visit: Payer: Self-pay | Admitting: Family Medicine

## 2023-05-19 DIAGNOSIS — F17200 Nicotine dependence, unspecified, uncomplicated: Secondary | ICD-10-CM

## 2023-05-19 MED ORDER — VARENICLINE TARTRATE (STARTER) 0.5 MG X 11 & 1 MG X 42 PO TBPK
ORAL_TABLET | ORAL | 0 refills | Status: AC
Start: 2023-05-19 — End: ?

## 2023-05-19 MED ORDER — VARENICLINE TARTRATE 1 MG PO TABS
1.0000 mg | ORAL_TABLET | Freq: Two times a day (BID) | ORAL | 0 refills | Status: AC
Start: 2023-05-19 — End: ?

## 2023-05-19 NOTE — Progress Notes (Signed)
Rx sent.  Please advise to continue medication for 1 month AFTER she has stopped smoking to prevent relapse.  See me in 3-4 months for recheck.

## 2023-05-24 ENCOUNTER — Telehealth: Payer: Medicare HMO

## 2023-05-24 ENCOUNTER — Ambulatory Visit: Payer: Self-pay | Admitting: *Deleted

## 2023-05-24 DIAGNOSIS — E782 Mixed hyperlipidemia: Secondary | ICD-10-CM

## 2023-05-24 DIAGNOSIS — I10 Essential (primary) hypertension: Secondary | ICD-10-CM

## 2023-05-24 NOTE — Chronic Care Management (AMB) (Signed)
Chronic Care Management   CCM RN Visit Note  05/24/2023 Name: ANALESE Garza MRN: [id number] DOB: 1961/11/01  Subjective: [name] is a 62 y.o. year old female who is a primary care patient of Raliegh Ip, DO. The patient was referred to the Chronic Care Management team for assistance with care management needs subsequent to provider initiation of CCM services and plan of care.    Today's Visit:   Case closed, patient cancelled scheduled appointment          Goals Addressed             This Visit's Progress    COMPLETED: CCM (HYPERLIPIDEMIA) EXPECTED OUTCOME: MONITOR, SELF-MANAGE AND REDUCE SYMPTOMS OF HYPERLIPIDEMIA       Patient cancelled scheduled appointment / CCM services, case closure Current Barriers:  Knowledge Deficits related to Hyperlipidemia management Chronic Disease Management support and education needs related to Hyperlipidemia and diet No Advanced Directives in place- pt declines Patient reports she tries to adhere to heart healthy diet Patient reports she has all medications on hand including nexletol Patient reports she has upcoming myelogram in April because of back issues  Planned Interventions: Provider established cholesterol goals reviewed; Counseled on importance of regular laboratory monitoring as prescribed; Reviewed role and benefits of statin for ASCVD risk reduction; Discussed strategies to manage statin-induced myalgias; Reviewed importance of limiting foods high in cholesterol; Pain assessment completed  Symptom Management: Take medications as prescribed   Attend all scheduled provider appointments Call pharmacy for medication refills 3-7 days in advance of running out of medications Attend church or other social activities Perform all self care activities independently  Perform IADL's (shopping, preparing meals, housekeeping, managing finances) independently Call provider office for new concerns or questions  - call for  medicine refill 2 or 3 days before it runs out - take all medications exactly as prescribed - call doctor with any symptoms you believe are related to your medicine - call doctor when you experience any new symptoms - go to all doctor appointments as scheduled - adhere to prescribed diet: heart healthy  Follow Up Plan: Telephone follow up appointment with care management team member scheduled for:  05/24/23 at 215 pm       COMPLETED: CCM (HYPERTENSION) EXPECTED OUTCOME: MONITOR, SELF-MANAGE AND REDUCE SYMPTOMS OF HYPERTENSION       Patient cancelled scheduled appointment/ CCM services, case closed Current Barriers:  Knowledge Deficits related to Hypertension management Chronic Disease Management support and education needs related to Hypertensions and diet No Advanced Directives in place- pt declines Patient reports she lives with spouse, is independent in all apsects of her care, is unable to exercise due to arthritis/ chronic pain issues. Patient states she checks blood pressure "when I feel it might be high" Patient reports she tries to adhere to heart healthy diet, no new concerns reported today  Planned Interventions: Evaluation of current treatment plan related to hypertension self management and patient's adherence to plan as established by provider;   Provided education to patient re: stroke prevention, s/s of heart attack and stroke; Reviewed medications with patient and discussed importance of compliance;  Counseled on the importance of exercise goals with target of 150 minutes per week Advised patient, providing education and rationale, to monitor blood pressure daily and record, calling PCP for findings outside established parameters;  Reinforced low sodium diet  Symptom Management: Take medications as prescribed   Attend all scheduled provider appointments Call pharmacy for medication refills 3-7 days in advance  of running out of medications Attend church or other social  activities Perform all self care activities independently  Perform IADL's (shopping, preparing meals, housekeeping, managing finances) independently Call provider office for new concerns or questions  check blood pressure 3 times per week choose a place to take my blood pressure (home, clinic or office, retail store) write blood pressure results in a log or diary learn about high blood pressure keep a blood pressure log take blood pressure log to all doctor appointments call doctor for signs and symptoms of high blood pressure develop an action plan for high blood pressure keep all doctor appointments take medications for blood pressure exactly as prescribed report new symptoms to your doctor eat more whole grains, fruits and vegetables, lean meats and healthy fats Follow low sodium diet- read food labels for sodium content  Follow Up Plan: Telephone follow up appointment with care management team member scheduled for:  05/24/23 at 215 pm          Plan:No further follow up required: case closure  Irving Shows [facility], BSN RN Case Manager [facility] [phone]

## 2023-06-02 ENCOUNTER — Ambulatory Visit
Admission: RE | Admit: 2023-06-02 | Discharge: 2023-06-02 | Disposition: A | Discharge status: Home / Self Care | Payer: Medicare HMO | Source: Ambulatory Visit | Attending: Family Medicine | Admitting: Family Medicine

## 2023-06-02 ENCOUNTER — Other Ambulatory Visit: Payer: Self-pay | Admitting: Family Medicine

## 2023-06-02 DIAGNOSIS — Z1231 Encounter for screening mammogram for malignant neoplasm of breast: Secondary | ICD-10-CM | POA: Diagnosis not present

## 2023-06-04 ENCOUNTER — Other Ambulatory Visit: Payer: Self-pay | Admitting: Family Medicine

## 2023-06-08 ENCOUNTER — Telehealth: Payer: Self-pay | Admitting: Family Medicine

## 2023-06-08 NOTE — Telephone Encounter (Signed)
Just FYI.

## 2023-06-11 DIAGNOSIS — G894 Chronic pain syndrome: Secondary | ICD-10-CM | POA: Diagnosis not present

## 2023-06-11 DIAGNOSIS — M961 Postlaminectomy syndrome, not elsewhere classified: Secondary | ICD-10-CM | POA: Diagnosis not present

## 2023-06-11 NOTE — Telephone Encounter (Signed)
Referral has been cancelled as Patient requested.

## 2023-06-22 ENCOUNTER — Encounter: Payer: Self-pay | Admitting: Family Medicine

## 2023-07-05 ENCOUNTER — Encounter: Payer: Self-pay | Admitting: Family Medicine

## 2023-07-05 ENCOUNTER — Ambulatory Visit (INDEPENDENT_AMBULATORY_CARE_PROVIDER_SITE_OTHER): Payer: Medicare HMO | Admitting: Family Medicine

## 2023-07-05 VITALS — BP 158/73 | HR 68 | Temp 98.3°F | Ht 64.0 in | Wt 175.0 lb

## 2023-07-05 DIAGNOSIS — N941 Unspecified dyspareunia: Secondary | ICD-10-CM

## 2023-07-05 DIAGNOSIS — I10 Essential (primary) hypertension: Secondary | ICD-10-CM | POA: Diagnosis not present

## 2023-07-05 DIAGNOSIS — Z635 Disruption of family by separation and divorce: Secondary | ICD-10-CM | POA: Diagnosis not present

## 2023-07-05 MED ORDER — HYDROCHLOROTHIAZIDE 25 MG PO TABS
25.0000 mg | ORAL_TABLET | Freq: Every day | ORAL | 3 refills | Status: DC
Start: 2023-07-05 — End: 2024-03-07

## 2023-07-05 MED ORDER — HYDRALAZINE HCL 50 MG PO TABS: 50.0000 mg | ORAL_TABLET | Freq: Two times a day (BID) | ORAL | 3 refills | Status: DC

## 2023-07-05 MED ORDER — ATENOLOL 50 MG PO TABS
50.0000 mg | ORAL_TABLET | Freq: Two times a day (BID) | ORAL | 3 refills | Status: DC
Start: 2023-07-05 — End: 2024-04-17

## 2023-07-05 NOTE — Progress Notes (Signed)
Subjective: CC: Follow-up PCP: Raliegh Ip, DO ZOX:[id number] [name] is Betty 62 y.o. female presenting to clinic today for:  1.  Reactive anxiety, stress Patient reports that she has been doing much better from mental health standpoint.  She remains separated from her spouse but now seeing someone new, Betty friend that she has had for well over 20 years.  She is interested in taking the relationship to the next level but worries because she has not had sex in over 10 years and it has been quite uncomfortable with attempts at penetration.  2.  Hypertension Patient reports blood pressures typically running 140/70 at home.  She utilizes lisinopril, atenolol and hydralazine as scheduled but HCTZ is as needed.  She does note that she was accidentally taking Betty muscle relaxer instead of her atenolol for few days but has been back on the atenolol consistently for the last several days.  No chest pain, shortness of breath or dizziness reported   ROS: Per HPI  Allergies  Allergen Reactions   Doxycycline     Other Reaction(s): GI Intolerance   Nsaids Nausea Only    Abdominal pain   Tolmetin     Other Reaction(s): GI Intolerance  Abdominal pain   Past Medical History:  Diagnosis Date   Abdominal pain    Abscess, intra-abdominal, postoperative 12/31/2020   Acute blood loss anemia (ABLA) 05/04/2021   Acute ITP (HCC) 05/08/2021   Allergy    seasonal   Anemia 2021   Anxiety    Arthritis    Aspiration pneumonia of both lower lobes due to gastric secretions (HCC) 12/22/2020   Blood in stool    Blood transfusion without reported diagnosis 12/21/2020   Breast changes, fibrocystic    Bruises easily    Cardiomegaly 2018   Clotting disorder (HCC)     itp , none since 1976, no current hematologist   Colon perforation due to stercoral ulcer with E. coli fecal peritonitis    Constipation    DDD (degenerative disc disease), cervical    with lumbar issues   Depression    E. coli sepsis -due to  fecal peritonitis 12/23/2020   Generalized headaches    GERD (gastroesophageal reflux disease)    Glaucoma    Hemorrhoid    Hyperlipidemia    Hypertension    Idiopathic thrombocytopenia (HCC)    as child   Leg swelling    both ankles   Liver lesion    Nasal congestion    Nausea & vomiting    Neuromuscular disorder (HCC)    back  injury   Neuromuscular disorder (HCC)    3 neck surgeries,neck fusion.pin,plates,screws   Osteoporosis    Panic attacks    pt also  has germaphobia   Rectal bleeding    Seizures (HCC) 1998   stress induced, one time, none since, no seizure meds   Seizures (HCC)    Stated she had 2 weeks ago,it was like Betty transient,stare.04/01/21 pt. denies   Spleen enlarged 1976, none since   r/t idiopathic thrombocytopenia   Stercoral ulcer of large intestine 12/21/2020   Trouble swallowing    Unintentional weight loss    Weakness    weakness varies    Current Outpatient Medications:    albuterol (VENTOLIN HFA) 108 (90 Base) MCG/ACT inhaler, Inhale 2 puffs into the lungs every 6 (six) hours as needed for wheezing or shortness of breath., Disp: 8 g, Rfl: 0   amitriptyline (ELAVIL) 25 MG tablet, Take 25-50  mg by mouth See admin instructions. Take 50 mg by mouth at bedtime, may take 25-50 mg during the day if needed for pain, Disp: , Rfl:    atenolol (TENORMIN) 50 MG tablet, Take 1 tablet (50 mg total) by mouth 2 (two) times daily., Disp: 180 tablet, Rfl: 3   Bempedoic Acid (NEXLETOL) 180 MG TABS, Take 180 mg by mouth daily., Disp: 90 tablet, Rfl: 3   busPIRone (BUSPAR) 15 MG tablet, Take 1 tablet (15 mg total) by mouth 3 (three) times daily., Disp: 270 tablet, Rfl: 3   busPIRone (BUSPAR) 5 MG tablet, TAKE 1 TABLET BY MOUTH 3 TIMES DAILY. TAKE 1 TABLET WITH THE 15MG  TO EQUAL 20MG ., Disp: 270 tablet, Rfl: 3   cetirizine (ZYRTEC) 10 MG tablet, Take 1 tablet (10 mg total) by mouth daily., Disp: 90 tablet, Rfl: 3   citalopram (CELEXA) 40 MG tablet, TAKE 1 TABLET BY MOUTH  DAILY FOR MOOD, Disp: 90 tablet, Rfl: 3   famotidine (PEPCID) 20 MG tablet, Take 1 tablet (20 mg total) by mouth daily as needed for heartburn or indigestion., Disp: 30 tablet, Rfl: 6   fluticasone (FLONASE) 50 MCG/ACT nasal spray, SPRAY 2 SPRAYS INTO EACH NOSTRIL EVERY DAY, Disp: 48 mL, Rfl: 0   hydrALAZINE (APRESOLINE) 50 MG tablet, Take 1 tablet (50 mg total) by mouth in the morning and at bedtime., Disp: 180 tablet, Rfl: 3   hydrochlorothiazide (HYDRODIURIL) 25 MG tablet, Take 1 tablet (25 mg total) by mouth daily., Disp: 90 tablet, Rfl: 3   latanoprost (XALATAN) 0.005 % ophthalmic solution, Place 1 drop into both eyes at bedtime., Disp: , Rfl:    linaclotide (LINZESS) 290 MCG CAPS capsule, Take 1 capsule (290 mcg total) by mouth daily before breakfast., Disp: 90 capsule, Rfl: 3   lisinopril (ZESTRIL) 10 MG tablet, Take 0.5-1 tablets (5-10 mg total) by mouth daily. As directed, Disp: 90 tablet, Rfl: 3   naloxone (NARCAN) nasal spray 4 mg/0.1 mL, SMARTSIG:Both Nares, Disp: , Rfl:    nitroGLYCERIN (NITROSTAT) 0.4 MG SL tablet, PLACE 1 TABLET UNDER THE TONGUE EVERY 5 MINUTES AS NEEDED FOR CHEST PAIN., Disp: 25 tablet, Rfl: 2   ondansetron (ZOFRAN) 4 MG tablet, Take 1 tablet (4 mg total) by mouth every 8 (eight) hours as needed for nausea or vomiting., Disp: 20 tablet, Rfl: 0   Oxycodone HCl 10 MG TABS, Take by mouth., Disp: , Rfl:    oxyCODONE-acetaminophen (PERCOCET) 10-325 MG tablet, Take 1 tablet by mouth every 4 (four) hours as needed for pain. (Patient not taking: Reported on 05/05/2023), Disp: 20 tablet, Rfl: 0   polyethylene glycol powder (MIRALAX) 17 GM/SCOOP powder, Take 17 g by mouth in the morning and at bedtime., Disp: 225 g, Rfl: PRN   tiZANidine (ZANAFLEX) 4 MG tablet, Take 1 tablet (4 mg total) by mouth every 8 (eight) hours as needed for muscle spasms., Disp: 30 tablet, Rfl: 1   traZODone (DESYREL) 100 MG tablet, Take 1.5 tablets (150 mg total) by mouth at bedtime as needed. for sleep,  Disp: 135 tablet, Rfl: 3   varenicline (CHANTIX) 1 MG tablet, Take 1 tablet (1 mg total) by mouth 2 (two) times daily. Start month #2, Disp: 180 tablet, Rfl: 0   Varenicline Tartrate, Starter, (CHANTIX STARTING MONTH PAK) 0.5 MG X 11 & 1 MG X 42 TBPK, Take 0.5 mg tablet by mouth once daily x3 days, then 0.5 mg tablet twice daily x4 days, then increase to one 1 mg tablet twice daily., Disp:  53 each, Rfl: 0 Social History   Socioeconomic History   Marital status: Married    Spouse name: Casimiro Needle   Number of children: 3   Years of education: 14   Highest education level: Associate degree: academic program  Occupational History   Occupation: Sports coach: UNEMPLOYED  Tobacco Use   Smoking status: Every Day    Current packs/day: 1.00    Average packs/day: 1 pack/day for 10.0 years (10.0 ttl pk-yrs)    Types: Cigarettes   Smokeless tobacco: Never   Tobacco comments:    Started 2007  Vaping Use   Vaping status: Former   Quit date: 09/07/2016   Devices: only used for about Betty month  Substance and Sexual Activity   Alcohol use: No   Drug use: Yes    Types: Marijuana   Sexual activity: Not Currently  Other Topics Concern   Not on file  Social History Narrative   She lives with her boyfriend.  She used to smoke, quit May 2013, she is not drinking. She has 3 children   Social Determinants of Health   Financial Resource Strain: Low Risk  (12/30/2022)   Overall Financial Resource Strain (CARDIA)    Difficulty of Paying Living Expenses: Not hard at all  Recent Concern: Financial Resource Strain - Medium Risk (10/27/2022)   Overall Financial Resource Strain (CARDIA)    Difficulty of Paying Living Expenses: Somewhat hard  Food Insecurity: No Food Insecurity (12/30/2022)   Hunger Vital Sign    Worried About Running Out of Food in the Last Year: Never true    Ran Out of Food in the Last Year: Never true  Recent Concern: Food Insecurity - Food Insecurity Present (10/27/2022)   Hunger  Vital Sign    Worried About Running Out of Food in the Last Year: Sometimes true    Ran Out of Food in the Last Year: Sometimes true  Transportation Needs: No Transportation Needs (12/30/2022)   PRAPARE - Administrator, Civil Service (Medical): No    Lack of Transportation (Non-Medical): No  Physical Activity: Inactive (12/30/2022)   Exercise Vital Sign    Days of Exercise per Week: 0 days    Minutes of Exercise per Session: 0 min  Stress: No Stress Concern Present (12/30/2022)   [employer] of Occupational Health - Occupational Stress Questionnaire    Feeling of Stress : Not at all  Social Connections: Socially Integrated (12/30/2022)   Social Connection and Isolation Panel [NHANES]    Frequency of Communication with Friends and Family: More than three times Betty week    Frequency of Social Gatherings with Friends and Family: Once Betty week    Attends Religious Services: 1 to 4 times per year    Active Member of [employer] or Organizations: Yes    Attends Engineer, structural: More than 4 times per year    Marital Status: Married  Catering manager Violence: Not At Risk (12/30/2022)   Humiliation, Afraid, Rape, and Kick questionnaire    Fear of Current or Ex-Partner: No    Emotionally Abused: No    Physically Abused: No    Sexually Abused: No   Family History  Problem Relation Age of Onset   Hypertension Mother    Heart disease Mother    Anxiety disorder Mother    Depression Mother    Lung cancer Father    Cancer Father        lung   Alcohol abuse Father  Diabetes Sister    Bipolar disorder Sister    Anxiety disorder Sister    Heart disease Sister        Congestive Heart Failure   Hypertension Sister    Depression Sister    Heart disease Maternal Grandmother    Colon cancer Paternal Grandmother    Cancer Paternal Grandmother        colon   Arthritis Paternal Uncle    Esophageal cancer Neg Hx    Rectal cancer Neg Hx    Stomach cancer Neg Hx    Breast  cancer Neg Hx     Objective: Office vital signs reviewed. BP (!) 141/88   Pulse 68   Temp 98.3 F (36.8 C)   Ht 5\' 4"  (1.626 m)   Wt 175 lb (79.4 kg)   SpO2 95%   BMI 30.04 kg/m   Physical Examination:  General: Awake, alert, well nourished, No acute distress Cardio: Regular rate and rhythm.  S1, S2 heard.  No murmurs Pulm: Clear to auscultation bilaterally.  Normal work of breathing on room air Psych: Happy     07/05/2023    9:43 AM 05/05/2023   12:03 PM 12/30/2022    1:41 PM  Depression screen PHQ 2/9  Decreased Interest 0 2 0  Down, Depressed, Hopeless 0 2 0  PHQ - 2 Score 0 4 0  Altered sleeping  0   Tired, decreased energy  1   Change in appetite  1   Feeling bad or failure about yourself   1   Trouble concentrating  2   Moving slowly or fidgety/restless  0   Suicidal thoughts  0   PHQ-9 Score  9   Difficult doing work/chores  Very difficult       07/05/2023    9:43 AM 05/05/2023   12:04 PM 08/17/2022    2:40 PM 05/06/2022    3:15 PM  GAD 7 : Generalized Anxiety Score  Nervous, Anxious, on Edge 0 3 1 1   Control/stop worrying 0 3 2 2   Worry too much - different things 0 3 1 1   Trouble relaxing 0 2 1 1   Restless 0 2 1 1   Easily annoyed or irritable 0 2 1 1   Afraid - awful might happen 0 2 1 2   Total GAD 7 Score 0 17 8 9   Anxiety Difficulty Not difficult at all Somewhat difficult Not difficult at all Somewhat difficult   Assessment/ Plan: 62 y.o. female   Dyspareunia in female  Essential hypertension - Plan: hydrochlorothiazide (HYDRODIURIL) 25 MG tablet, hydrALAZINE (APRESOLINE) 50 MG tablet, atenolol (TENORMIN) 50 MG tablet  Separated from spouse  Suspect this is due to no sexual activity in over Betty decade.  We discussed gradual introduction to intercourse, use of lubricants and of course safe sex practices today.  BP NOT controlled. Recommend using the hydrochlorothiazide scheduled rather than PRN.  Doing MUCH better from Betty mental health standpoint.   Continues to navigate through divorce  Raliegh Ip, DO [facility] [phone]

## 2023-07-13 DIAGNOSIS — G894 Chronic pain syndrome: Secondary | ICD-10-CM | POA: Diagnosis not present

## 2023-07-13 DIAGNOSIS — Z79891 Long term (current) use of opiate analgesic: Secondary | ICD-10-CM | POA: Diagnosis not present

## 2023-07-13 DIAGNOSIS — M961 Postlaminectomy syndrome, not elsewhere classified: Secondary | ICD-10-CM | POA: Diagnosis not present

## 2023-07-13 DIAGNOSIS — Z79899 Other long term (current) drug therapy: Secondary | ICD-10-CM | POA: Diagnosis not present

## 2023-07-19 ENCOUNTER — Other Ambulatory Visit: Payer: Self-pay | Admitting: Orthopaedic Surgery

## 2023-07-19 DIAGNOSIS — M47896 Other spondylosis, lumbar region: Secondary | ICD-10-CM

## 2023-07-20 ENCOUNTER — Ambulatory Visit: Payer: Medicare HMO | Admitting: Physician Assistant

## 2023-07-27 ENCOUNTER — Encounter: Payer: Self-pay | Admitting: Plastic Surgery

## 2023-07-27 ENCOUNTER — Ambulatory Visit: Payer: Medicare HMO | Admitting: Plastic Surgery

## 2023-07-27 VITALS — BP 165/90 | HR 79 | Ht 64.0 in | Wt 173.2 lb

## 2023-07-27 DIAGNOSIS — M419 Scoliosis, unspecified: Secondary | ICD-10-CM | POA: Diagnosis not present

## 2023-07-27 DIAGNOSIS — F17210 Nicotine dependence, cigarettes, uncomplicated: Secondary | ICD-10-CM | POA: Diagnosis not present

## 2023-07-27 DIAGNOSIS — N62 Hypertrophy of breast: Secondary | ICD-10-CM | POA: Diagnosis not present

## 2023-07-27 DIAGNOSIS — Z933 Colostomy status: Secondary | ICD-10-CM

## 2023-07-27 DIAGNOSIS — F17200 Nicotine dependence, unspecified, uncomplicated: Secondary | ICD-10-CM

## 2023-07-27 DIAGNOSIS — M8580 Other specified disorders of bone density and structure, unspecified site: Secondary | ICD-10-CM | POA: Diagnosis not present

## 2023-07-27 DIAGNOSIS — M545 Low back pain, unspecified: Secondary | ICD-10-CM | POA: Diagnosis not present

## 2023-07-27 DIAGNOSIS — M5030 Other cervical disc degeneration, unspecified cervical region: Secondary | ICD-10-CM

## 2023-07-27 DIAGNOSIS — F331 Major depressive disorder, recurrent, moderate: Secondary | ICD-10-CM

## 2023-07-27 DIAGNOSIS — M5106 Intervertebral disc disorders with myelopathy, lumbar region: Secondary | ICD-10-CM | POA: Diagnosis not present

## 2023-07-27 DIAGNOSIS — Z91148 Patient's other noncompliance with medication regimen for other reason: Secondary | ICD-10-CM

## 2023-07-27 DIAGNOSIS — Z981 Arthrodesis status: Secondary | ICD-10-CM | POA: Diagnosis not present

## 2023-07-27 DIAGNOSIS — Z862 Personal history of diseases of the blood and blood-forming organs and certain disorders involving the immune mechanism: Secondary | ICD-10-CM

## 2023-07-27 DIAGNOSIS — N63 Unspecified lump in unspecified breast: Secondary | ICD-10-CM

## 2023-07-27 NOTE — Progress Notes (Signed)
Patient ID: [name], female    DOB: 1961/02/09, 62 y.o.   MRN: [id number]   Chief Complaint  Patient presents with   Consult        Breast Problem    Mammary Hyperplasia: The patient is a 62 y.o. female with a history of mammary hyperplasia for several years.  She has extremely large breasts causing symptoms that include the following: Back pain in the upper and lower back, including neck pain. She pulls or pins her bra straps to provide better lift and relief of the pressure and pain. She notices relief by holding her breast up manually.  Her shoulder straps cause grooves and pain and pressure that requires padding for relief. Pain medication is sometimes required with motrin and tylenol.  Activities that are hindered by enlarged breasts include: exercise and running.  She has tried supportive clothing as well as fitted bras without improvement.  Her breasts are extremely large and fairly symmetric.  She has hyperpigmentation of the inframammary area on both sides.  The sternal to nipple distance on the right is 31 cm and the left is 31 cm.  The IMF distance is 14 cm.  She is 5 feet 4 inches tall and weighs 173 pounds.  The BMI = 29.7 kg/m.  Preoperative bra size = DD cup.  The estimated excess breast tissue to be removed at the time of surgery = 500 grams on the left and 500 grams on the right.  Mammogram history: July 2024 negative.  Family history of breast cancer:  none.  Tobacco use:  current smoker.   The patient expresses the desire to pursue surgical intervention.  The patient has had over 5 surgeries on her back including her cervical spine and lower back.  She has a history of scoliosis.  She has had physical therapy multiple times in the past.  Her orthopedic surgeon said she cannot have any more physical therapy and that it would make her back worse     Review of Systems  Constitutional:  Positive for activity change. Negative for appetite change.  Eyes: Negative.    Respiratory: Negative.  Negative for chest tightness and shortness of breath.   Cardiovascular: Negative.   Gastrointestinal: Negative.   Endocrine: Negative.   Genitourinary: Negative.   Musculoskeletal:  Positive for back pain and neck pain.  Skin:  Positive for rash.  Hematological: Negative.     Past Medical History:  Diagnosis Date   Abdominal pain    Abscess, intra-abdominal, postoperative 12/31/2020   Acute blood loss anemia (ABLA) 05/04/2021   Acute ITP (HCC) 05/08/2021   Allergy    seasonal   Anemia 2021   Anxiety    Arthritis    Aspiration pneumonia of both lower lobes due to gastric secretions (HCC) 12/22/2020   Blood in stool    Blood transfusion without reported diagnosis 12/21/2020   Breast changes, fibrocystic    Bruises easily    Cardiomegaly 2018   Clotting disorder (HCC)     itp , none since 1976, no current hematologist   Colon perforation due to stercoral ulcer with E. coli fecal peritonitis    Constipation    DDD (degenerative disc disease), cervical    with lumbar issues   Depression    E. coli sepsis -due to fecal peritonitis 12/23/2020   Generalized headaches    GERD (gastroesophageal reflux disease)    Glaucoma    Hemorrhoid    Hyperlipidemia    Hypertension  Idiopathic thrombocytopenia (HCC)    as child   Leg swelling    both ankles   Liver lesion    Nasal congestion    Nausea & vomiting    Neuromuscular disorder (HCC)    back  injury   Neuromuscular disorder (HCC)    3 neck surgeries,neck fusion.pin,plates,screws   Osteoporosis    Panic attacks    pt also  has germaphobia   Rectal bleeding    Seizures (HCC) 1998   stress induced, one time, none since, no seizure meds   Seizures (HCC)    Stated she had 2 weeks ago,it was like a transient,stare.04/01/21 pt. denies   Spleen enlarged 1976, none since   r/t idiopathic thrombocytopenia   Stercoral ulcer of large intestine 12/21/2020   Trouble swallowing    Unintentional weight loss     Weakness    weakness varies    Past Surgical History:  Procedure Laterality Date   ABDOMINAL HYSTERECTOMY  1997   BACK SURGERY     CERVICAL SPINE SURGERY  2005, 2007, 2010   x3, fusion done,plates and screws present   CESAREAN SECTION  1988, 1994   CHOLECYSTECTOMY  03/14/2012   Procedure: LAPAROSCOPIC CHOLECYSTECTOMY;  Surgeon: Almond Lint, MD;  Location: [facility];  Service: General;  Laterality: N/A;   COLECTOMY WITH COLOSTOMY CREATION/HARTMANN PROCEDURE N/A 12/21/2020   Procedure: COLECTOMY WITH COLOSTOMY CREATION/HARTMANN PROCEDURE;  Surgeon: Lucretia Roers, MD;  Location: [facility];  Service: General;  Laterality: N/A;   COLON SURGERY     colonscopy and esophagogastrodudononescopy  02/04/2012   COLOSTOMY REVERSAL N/A 04/28/2021   Procedure: COLOSTOMY REVERSAL;  Surgeon: Lucretia Roers, MD;  Location: [facility];  Service: General;  Laterality: N/A;   EYE SURGERY  1999   HERNIA REPAIR  2022   INCISIONAL HERNIA REPAIR N/A 10/29/2021   Procedure: HERNIA REPAIR INCISIONAL; OPEN; WITH MESH;  Surgeon: Lucretia Roers, MD;  Location: [facility];  Service: General;  Laterality: N/A;   IR RADIOLOGIST EVAL & MGMT  01/15/2021   LUMBAR DISC SURGERY  11/2014   SPINE SURGERY  2005, 2007, etc   6 surgeries      Current Outpatient Medications:    albuterol (VENTOLIN HFA) 108 (90 Base) MCG/ACT inhaler, Inhale 2 puffs into the lungs every 6 (six) hours as needed for wheezing or shortness of breath., Disp: 8 g, Rfl: 0   amitriptyline (ELAVIL) 25 MG tablet, Take 25-50 mg by mouth See admin instructions. Take 50 mg by mouth at bedtime, may take 25-50 mg during the day if needed for pain, Disp: , Rfl:    atenolol (TENORMIN) 50 MG tablet, Take 1 tablet (50 mg total) by mouth 2 (two) times daily., Disp: 180 tablet, Rfl: 3   Bempedoic Acid (NEXLETOL) 180 MG TABS, Take 180 mg by mouth daily., Disp: 90 tablet, Rfl: 3   busPIRone (BUSPAR) 15 MG tablet, Take 1 tablet (15 mg total) by mouth 3 (three) times  daily., Disp: 270 tablet, Rfl: 3   busPIRone (BUSPAR) 5 MG tablet, TAKE 1 TABLET BY MOUTH 3 TIMES DAILY. TAKE 1 TABLET WITH THE 15MG  TO EQUAL 20MG ., Disp: 270 tablet, Rfl: 3   cetirizine (ZYRTEC) 10 MG tablet, Take 1 tablet (10 mg total) by mouth daily., Disp: 90 tablet, Rfl: 3   citalopram (CELEXA) 40 MG tablet, TAKE 1 TABLET BY MOUTH DAILY FOR MOOD, Disp: 90 tablet, Rfl: 3   famotidine (PEPCID) 20 MG tablet, Take 1 tablet (20 mg total) by mouth  daily as needed for heartburn or indigestion., Disp: 30 tablet, Rfl: 6   fluticasone (FLONASE) 50 MCG/ACT nasal spray, SPRAY 2 SPRAYS INTO EACH NOSTRIL EVERY DAY, Disp: 48 mL, Rfl: 0   hydrALAZINE (APRESOLINE) 50 MG tablet, Take 1 tablet (50 mg total) by mouth in the morning and at bedtime., Disp: 180 tablet, Rfl: 3   hydrochlorothiazide (HYDRODIURIL) 25 MG tablet, Take 1 tablet (25 mg total) by mouth daily., Disp: 90 tablet, Rfl: 3   latanoprost (XALATAN) 0.005 % ophthalmic solution, Place 1 drop into both eyes at bedtime., Disp: , Rfl:    linaclotide (LINZESS) 290 MCG CAPS capsule, Take 1 capsule (290 mcg total) by mouth daily before breakfast., Disp: 90 capsule, Rfl: 3   lisinopril (ZESTRIL) 10 MG tablet, Take 0.5-1 tablets (5-10 mg total) by mouth daily. As directed, Disp: 90 tablet, Rfl: 3   naloxone (NARCAN) nasal spray 4 mg/0.1 mL, SMARTSIG:Both Nares, Disp: , Rfl:    nitroGLYCERIN (NITROSTAT) 0.4 MG SL tablet, PLACE 1 TABLET UNDER THE TONGUE EVERY 5 MINUTES AS NEEDED FOR CHEST PAIN., Disp: 25 tablet, Rfl: 2   ondansetron (ZOFRAN) 4 MG tablet, Take 1 tablet (4 mg total) by mouth every 8 (eight) hours as needed for nausea or vomiting., Disp: 20 tablet, Rfl: 0   Oxycodone HCl 10 MG TABS, Take by mouth., Disp: , Rfl:    oxyCODONE-acetaminophen (PERCOCET) 10-325 MG tablet, Take 1 tablet by mouth every 4 (four) hours as needed for pain., Disp: 20 tablet, Rfl: 0   polyethylene glycol powder (MIRALAX) 17 GM/SCOOP powder, Take 17 g by mouth in the morning and  at bedtime., Disp: 225 g, Rfl: PRN   tiZANidine (ZANAFLEX) 4 MG tablet, Take 1 tablet (4 mg total) by mouth every 8 (eight) hours as needed for muscle spasms., Disp: 30 tablet, Rfl: 1   traZODone (DESYREL) 100 MG tablet, Take 1.5 tablets (150 mg total) by mouth at bedtime as needed. for sleep, Disp: 135 tablet, Rfl: 3   varenicline (CHANTIX) 1 MG tablet, Take 1 tablet (1 mg total) by mouth 2 (two) times daily. Start month #2, Disp: 180 tablet, Rfl: 0   Varenicline Tartrate, Starter, (CHANTIX STARTING MONTH PAK) 0.5 MG X 11 & 1 MG X 42 TBPK, Take 0.5 mg tablet by mouth once daily x3 days, then 0.5 mg tablet twice daily x4 days, then increase to one 1 mg tablet twice daily., Disp: 53 each, Rfl: 0   Objective:   Vitals:   07/27/23 1110  BP: (!) 165/90  Pulse: 79  SpO2: 95%    Physical Exam Vitals and nursing note reviewed.  Constitutional:      Appearance: Normal appearance.  HENT:     Head: Normocephalic and atraumatic.  Cardiovascular:     Rate and Rhythm: Normal rate.     Pulses: Normal pulses.  Pulmonary:     Effort: Pulmonary effort is normal.  Abdominal:     General: There is no distension.     Palpations: Abdomen is soft.  Skin:    General: Skin is warm.     Coloration: Skin is not jaundiced.     Findings: No bruising.  Neurological:     Mental Status: She is alert and oriented to person, place, and time.  Psychiatric:        Mood and Affect: Mood normal.        Behavior: Behavior normal.        Thought Content: Thought content normal.  Judgment: Judgment normal.     Assessment & Plan:  Lumbar disc disorder with myelopathy  DEGENERATIVE DISC DISEASE, CERVICAL SPINE  Osteopenia, unspecified location  Scoliosis, unspecified scoliosis type, unspecified spinal region  Moderate episode of recurrent major depressive disorder (HCC)  Mass of breast, unspecified laterality  Hx of fusion of cervical spine  History of ITP  Colostomy in place  [facility])  Controlled substance agreement broken  Smoker  Chronic bilateral low back pain, unspecified whether sciatica present  The procedure the patient selected and that was best for the patient was discussed. The risk were discussed and include but not limited to the following:  Breast asymmetry, fluid accumulation, firmness of the breast, inability to breast feed, loss of nipple or areola, skin loss, change in skin and nipple sensation, fat necrosis of the breast tissue, bleeding, infection and healing delay.  There are risks of anesthesia and injury to nerves or blood vessels.  Allergic reaction to tape, suture and skin glue are possible.  There will be swelling.  Any of these can lead to the need for revisional surgery which is not included in this surgery.  A breast reduction has potential to interfere with diagnostic procedures in the future.  This procedure is best done when the breast is fully developed.  Changes in the breast will continue to occur over time: pregnancy, weight gain or weigh loss. No guarantees are given for a certain bra or breast size.    Total time: 40 minutes. This includes time spent with the patient during the visit as well as time spent before and after the visit reviewing the chart, documenting the encounter, ordering pertinent studies and literature for the patient.   Physical therapy: Done many years ago and unable to do it again Mammogram: Up-to-date  Patient is a good candidate for bilateral breast reduction with liposuction.  She has to be 3 months tobacco free in order to have the surgery.  The plan is for her to give [place] a call when she is tobacco free and then we will submit it to insurance.  Pictures were obtained of the patient and placed in the chart with the patient's or guardian's permission.   Alena Bills Telena Peyser, DO

## 2023-08-07 ENCOUNTER — Other Ambulatory Visit: Payer: Self-pay | Admitting: Family Medicine

## 2023-08-07 DIAGNOSIS — J0100 Acute maxillary sinusitis, unspecified: Secondary | ICD-10-CM

## 2023-08-09 ENCOUNTER — Other Ambulatory Visit: Payer: Self-pay | Admitting: Family Medicine

## 2023-08-09 DIAGNOSIS — F17200 Nicotine dependence, unspecified, uncomplicated: Secondary | ICD-10-CM

## 2023-08-11 DIAGNOSIS — M542 Cervicalgia: Secondary | ICD-10-CM | POA: Diagnosis not present

## 2023-08-11 DIAGNOSIS — M545 Low back pain, unspecified: Secondary | ICD-10-CM | POA: Diagnosis not present

## 2023-08-11 DIAGNOSIS — G894 Chronic pain syndrome: Secondary | ICD-10-CM | POA: Diagnosis not present

## 2023-08-30 NOTE — Discharge Instructions (Signed)

## 2023-08-31 ENCOUNTER — Ambulatory Visit
Admission: RE | Admit: 2023-08-31 | Discharge: 2023-08-31 | Disposition: A | Discharge status: Home / Self Care | Payer: Medicare HMO | Source: Ambulatory Visit | Attending: Orthopaedic Surgery | Admitting: Orthopaedic Surgery

## 2023-08-31 DIAGNOSIS — M47896 Other spondylosis, lumbar region: Secondary | ICD-10-CM

## 2023-08-31 DIAGNOSIS — M4727 Other spondylosis with radiculopathy, lumbosacral region: Secondary | ICD-10-CM | POA: Diagnosis not present

## 2023-08-31 MED ORDER — IOPAMIDOL (ISOVUE-M 200) INJECTION 41%
1.0000 mL | Freq: Once | INTRAMUSCULAR | Status: AC
  Administered 2023-08-31: 1 mL via INTRA_ARTICULAR

## 2023-08-31 MED ORDER — METHYLPREDNISOLONE ACETATE 40 MG/ML INJ SUSP (RADIOLOG
80.0000 mg | Freq: Once | INTRAMUSCULAR | Status: AC
  Administered 2023-08-31: 80 mg via INTRA_ARTICULAR

## 2023-09-06 ENCOUNTER — Other Ambulatory Visit: Payer: Self-pay | Admitting: Family Medicine

## 2023-09-06 DIAGNOSIS — F17200 Nicotine dependence, unspecified, uncomplicated: Secondary | ICD-10-CM

## 2023-09-09 ENCOUNTER — Other Ambulatory Visit (HOSPITAL_COMMUNITY): Payer: Self-pay

## 2023-09-09 ENCOUNTER — Telehealth: Payer: Self-pay

## 2023-09-09 NOTE — Telephone Encounter (Signed)
Pharmacy Patient Advocate Encounter  Received notification from [facility] that Prior Authorization for Varenicline Tartrate 1MG  tablets has been APPROVED from 11/09/2022 to 03/07/2024. Ran test claim, Copay is $0.00. This test claim was processed through [facility]- copay amounts may vary at other pharmacies due to pharmacy/plan contracts, or as the patient moves through the different stages of their insurance plan.   PA #/Case ID/Reference #: [id number]

## 2023-09-09 NOTE — Telephone Encounter (Signed)
[name] ([name]) PA Case ID #: [id number] Rx #: [id number] Need Help? Call us at [phone] Status sent iconSent to Plan today Drug Varenicline Tartrate 1MG  tablets ePA cloud Psychologist, educational Medicare Electronic PA Form [phone] NCPDP) Original Claim Info [id number] PRECERT REQ BY MD [phone] 168 DAYS SUPPLY PER YEARDRUG REQUIRES PRIOR AUTHORIZATIONDAYS SUPPLY REMAINING 020NEXT FILL DT [id number], LAST FILL [id number] [facility],PH# [id number](SW

## 2023-09-09 NOTE — Telephone Encounter (Signed)
Pt aware of PA approval

## 2023-09-30 ENCOUNTER — Other Ambulatory Visit: Payer: Self-pay | Admitting: Family Medicine

## 2023-09-30 DIAGNOSIS — F17200 Nicotine dependence, unspecified, uncomplicated: Secondary | ICD-10-CM

## 2023-10-11 DIAGNOSIS — G894 Chronic pain syndrome: Secondary | ICD-10-CM | POA: Diagnosis not present

## 2023-10-11 DIAGNOSIS — M542 Cervicalgia: Secondary | ICD-10-CM | POA: Diagnosis not present

## 2023-10-11 DIAGNOSIS — M545 Low back pain, unspecified: Secondary | ICD-10-CM | POA: Diagnosis not present

## 2023-10-21 ENCOUNTER — Ambulatory Visit: Payer: Medicare HMO | Admitting: Podiatry

## 2023-10-21 ENCOUNTER — Ambulatory Visit: Payer: Medicare HMO

## 2023-10-21 ENCOUNTER — Encounter: Payer: Self-pay | Admitting: Podiatry

## 2023-10-21 DIAGNOSIS — S90121A Contusion of right lesser toe(s) without damage to nail, initial encounter: Secondary | ICD-10-CM | POA: Diagnosis not present

## 2023-10-21 DIAGNOSIS — S92514A Nondisplaced fracture of proximal phalanx of right lesser toe(s), initial encounter for closed fracture: Secondary | ICD-10-CM | POA: Diagnosis not present

## 2023-10-21 NOTE — Progress Notes (Signed)
Subjective:   Patient ID: [name], female   DOB: 62 y.o.   MRN: [id number]   HPI Patient states she damaged her right toes 1 month ago and is concerned about fracture of the digit and has been trying to get it to the office   ROS      Objective:  Physical Exam  Ocular status intact inflammation pain third digit right foot fluid buildup around the proximal phalanx painful when pressed with no other current pathology note     Assessment:  Probability for fracture digit 3 right     Plan:  H&P reviewed and I did discuss fracture of the third toe based on x-ray and at this point patient will utilize wider shoes and ice as needed but it should heal uneventfully but will probably take another 8 weeks to heal completely.  Discussed with patient  X-rays indicate fracture of the proximal phalanx digit 3 right gradually

## 2023-10-28 ENCOUNTER — Ambulatory Visit: Payer: Medicare HMO | Admitting: Podiatry

## 2023-10-30 ENCOUNTER — Encounter: Payer: Self-pay | Admitting: Family Medicine

## 2023-11-04 ENCOUNTER — Other Ambulatory Visit: Payer: Self-pay | Admitting: Family Medicine

## 2023-11-04 DIAGNOSIS — J0100 Acute maxillary sinusitis, unspecified: Secondary | ICD-10-CM

## 2023-11-09 DIAGNOSIS — M542 Cervicalgia: Secondary | ICD-10-CM | POA: Diagnosis not present

## 2023-11-09 DIAGNOSIS — G894 Chronic pain syndrome: Secondary | ICD-10-CM | POA: Diagnosis not present

## 2023-11-09 DIAGNOSIS — M545 Low back pain, unspecified: Secondary | ICD-10-CM | POA: Diagnosis not present

## 2023-11-22 ENCOUNTER — Ambulatory Visit: Payer: Self-pay | Admitting: Family Medicine

## 2023-11-22 NOTE — Telephone Encounter (Signed)
 Copied from CRM [id number]. Topic: Clinical - Red Word Triage >> Nov 22, 2023  3:03 PM Laurier BROCKS wrote: Red Word that prompted transfer to Nurse Triage: Patient believes she may have pneumonia. Symptoms include green phlegm, cough and congestion and possible fever   Chief Complaint: Cough Symptoms: Cough, green sputum production, post cough vomiting, chest pain with cough, nasal congestion   Frequency: Frequent  Pertinent Negatives: Patient denies fever, shortness of breath  Disposition: [] ED /[] Urgent Care (no appt availability in office) / [x] Appointment(In office/virtual)/ []  [facility]/ [] Home Care/ [] Refused Recommended Disposition /[] [facility]/ []  Follow-up with PCP Additional Notes: Patient reports she has been experiencing a cough that began 4 days ago. She states that with her cough she has been experiencing green sputum production, post cough vomiting, nasal congestion, and chest pain with cough. She states she is also having difficulty breathing due to the nasal congestion but denies any other shortness of breath. Patient states she is concerned she may have pneumonia. Patient requesting virtual appointment due to inability to come into office.     Reason for Disposition  SEVERE coughing spells (e.g., whooping sound after coughing, vomiting after coughing)  Answer Assessment - Initial Assessment Questions 1. ONSET: When did the cough begin?      4 days ago 2. SEVERITY: How bad is the cough today?      It's bad man 3. SPUTUM: Describe the color of your sputum (none, dry cough; clear, white, yellow, green)     Green 4. HEMOPTYSIS: Are you coughing up any blood? If so ask: How much? (flecks, streaks, tablespoons, etc.)     No 5. DIFFICULTY BREATHING: Are you having difficulty breathing? If Yes, ask: How bad is it? (e.g., mild, moderate, severe)    - MILD: No SOB at rest, mild SOB with walking, speaks normally in sentences, can lie down, no  retractions, pulse < 100.    - MODERATE: SOB at rest, SOB with minimal exertion and prefers to sit, cannot lie down flat, speaks in phrases, mild retractions, audible wheezing, pulse 100-120.    - SEVERE: Very SOB at rest, speaks in single words, struggling to breathe, sitting hunched forward, retractions, pulse > 120      Mild because my nose is so stuffed up 6. FEVER: Do you have a fever? If Yes, ask: What is your temperature, how was it measured, and when did it start?     Unsure, not able to check 7. CARDIAC HISTORY: Do you have any history of heart disease? (e.g., heart attack, congestive heart failure)      No 8. LUNG HISTORY: Do you have any history of lung disease?  (e.g., pulmonary embolus, asthma, emphysema)     No 9. PE RISK FACTORS: Do you have a history of blood clots? (or: recent major surgery, recent prolonged travel, bedridden)     No 10. OTHER SYMPTOMS: Do you have any other symptoms? (e.g., runny nose, wheezing, chest pain)       Cough, fatigue, chest pain and back pain  11. PREGNANCY: Is there any chance you are pregnant? When was your last menstrual period?       No 12. TRAVEL: Have you traveled out of the country in the last month? (e.g., travel history, exposures)       No  Protocols used: Cough - Acute Productive-A-AH

## 2023-11-23 ENCOUNTER — Telehealth (INDEPENDENT_AMBULATORY_CARE_PROVIDER_SITE_OTHER): Payer: Medicare HMO | Admitting: Nurse Practitioner

## 2023-11-23 ENCOUNTER — Encounter: Payer: Self-pay | Admitting: Nurse Practitioner

## 2023-11-23 DIAGNOSIS — R051 Acute cough: Secondary | ICD-10-CM | POA: Diagnosis not present

## 2023-11-23 DIAGNOSIS — J019 Acute sinusitis, unspecified: Secondary | ICD-10-CM

## 2023-11-23 DIAGNOSIS — B9689 Other specified bacterial agents as the cause of diseases classified elsewhere: Secondary | ICD-10-CM | POA: Insufficient documentation

## 2023-11-23 MED ORDER — AZITHROMYCIN 250 MG PO TABS: ORAL_TABLET | ORAL | 0 refills | Status: DC

## 2023-11-23 MED ORDER — BENZONATATE 100 MG PO CAPS: 100.0000 mg | ORAL_CAPSULE | Freq: Three times a day (TID) | ORAL | 0 refills | Status: DC | PRN

## 2023-11-23 NOTE — Progress Notes (Signed)
 Virtual Visit via video Note Due to COVID-19 pandemic this visit was conducted virtually. This visit type was conducted due to national recommendations for restrictions regarding the COVID-19 Pandemic (e.g. social distancing, sheltering in place) in an effort to limit this patient's exposure and mitigate transmission in our community. All issues noted in this document were discussed and addressed.  A physical exam was not performed with this format.   I connected with [name] on 11/23/2023 at [phone] by name DOB and verified that I am speaking with the correct person using two identifiers. [name] is currently located at home alone during visit. The provider, Nena Deitra Morton Sebastian, DNP is located in their office at time of visit.  I discussed the limitations, risks, security and privacy concerns of performing an evaluation and management service by virtual visit and the availability of in person appointments. I also discussed with the patient that there may be a patient responsible charge related to this service. The patient expressed understanding and agreed to proceed.  Subjective:  Patient ID: [name], female    DOB: 08-08-1961, 63 y.o.   MRN: [id number]  Chief Complaint:  Cough and Headache (Nasal congestion for 1-week not getting with OTC med  I have walking pneumonia because both y mom and sister has it)  HPI: [name] is a 63 y.o. female presenting on 11/23/2023 for Cough and Headache (Nasal congestion for 1-week not getting with OTC med  I have walking pneumonia because both y mom and sister has it) She was very upset because of video visit because the visit was not started at exactly 0830 as scheduled  Cough: Patient complains of productive cough with sputum described as green.  Symptoms began 2 weeks ago.  The cough is productive of green/yellow sputum, nocturnal and is aggravated by nothing Associated symptoms include:sputum production. She doesn't   have a history of asthma. Patient does not have a history of environmental allergens. Patient does not have recent travel. . Patient  does not have previous Chest X-ray. Denies fever, wheezing, OB, chest pain.  Relevant past medical, surgical, family, and social history reviewed and updated as indicated.  Allergies and medications reviewed and updated.   Past Medical History:  Diagnosis Date   Abdominal pain    Abscess, intra-abdominal, postoperative (HCC) 12/31/2020   Acute blood loss anemia (ABLA) 05/04/2021   Acute ITP (HCC) 05/08/2021   Allergy    seasonal   Anemia 2021   Anxiety    Arthritis    Aspiration pneumonia of both lower lobes due to gastric secretions (HCC) 12/22/2020   Blood in stool    Blood transfusion without reported diagnosis 12/21/2020   Breast changes, fibrocystic    Bruises easily    Cardiomegaly 2018   Clotting disorder (HCC)     itp , none since 1976, no current hematologist   Colon perforation due to stercoral ulcer with E. coli fecal peritonitis    Constipation    DDD (degenerative disc disease), cervical    with lumbar issues   Depression    E. coli sepsis -due to fecal peritonitis 12/23/2020   Generalized headaches    GERD (gastroesophageal reflux disease)    Glaucoma    Hemorrhoid    Hyperlipidemia    Hypertension    Idiopathic thrombocytopenia (HCC)    as child   Leg swelling    both ankles   Liver lesion    Nasal congestion    Nausea &  vomiting    Neuromuscular disorder (HCC)    back  injury   Neuromuscular disorder (HCC)    3 neck surgeries,neck fusion.pin,plates,screws   Osteoporosis    Panic attacks    pt also  has germaphobia   Rectal bleeding    Seizures (HCC) 1998   stress induced, one time, none since, no seizure meds   Seizures (HCC)    Stated she had 2 weeks ago,it was like a transient,stare.04/01/21 pt. denies   Spleen enlarged 1976, none since   r/t idiopathic thrombocytopenia   Stercoral ulcer of large intestine  12/21/2020   Trouble swallowing    Unintentional weight loss    Weakness    weakness varies    Past Surgical History:  Procedure Laterality Date   ABDOMINAL HYSTERECTOMY  1997   BACK SURGERY     CERVICAL SPINE SURGERY  2005, 2007, 2010   x3, fusion done,plates and screws present   CESAREAN SECTION  1988, 1994   CHOLECYSTECTOMY  03/14/2012   Procedure: LAPAROSCOPIC CHOLECYSTECTOMY;  Surgeon: Jina Nephew, MD;  Location: [facility];  Service: General;  Laterality: N/A;   COLECTOMY WITH COLOSTOMY CREATION/HARTMANN PROCEDURE N/A 12/21/2020   Procedure: COLECTOMY WITH COLOSTOMY CREATION/HARTMANN PROCEDURE;  Surgeon: Kallie Manuelita BROCKS, MD;  Location: [facility];  Service: General;  Laterality: N/A;   COLON SURGERY     colonscopy and esophagogastrodudononescopy  02/04/2012   COLOSTOMY REVERSAL N/A 04/28/2021   Procedure: COLOSTOMY REVERSAL;  Surgeon: Kallie Manuelita BROCKS, MD;  Location: [facility];  Service: General;  Laterality: N/A;   EYE SURGERY  1999   HERNIA REPAIR  2022   INCISIONAL HERNIA REPAIR N/A 10/29/2021   Procedure: HERNIA REPAIR INCISIONAL; OPEN; WITH MESH;  Surgeon: Kallie Manuelita BROCKS, MD;  Location: [facility];  Service: General;  Laterality: N/A;   IR RADIOLOGIST EVAL & MGMT  01/15/2021   LUMBAR DISC SURGERY  11/2014   SPINE SURGERY  2005, 2007, etc   6 surgeries    Social History   Socioeconomic History   Marital status: Married    Spouse name: Ozell   Number of children: 3   Years of education: 14   Highest education level: Associate degree: academic program  Occupational History   Occupation: Sports Coach: UNEMPLOYED  Tobacco Use   Smoking status: Every Day    Current packs/day: 1.00    Average packs/day: 1 pack/day for 10.0 years (10.0 ttl pk-yrs)    Types: Cigarettes   Smokeless tobacco: Never   Tobacco comments:    Started 2007  Vaping Use   Vaping status: Former   Quit date: 09/07/2016   Devices: only used for about a month  Substance and Sexual  Activity   Alcohol use: No   Drug use: Yes    Types: Marijuana   Sexual activity: Not Currently  Other Topics Concern   Not on file  Social History Narrative   She lives with her boyfriend.  She used to smoke, quit May 2013, she is not drinking. She has 3 children   Social Drivers of Corporate Investment Banker Strain: Low Risk  (12/30/2022)   Overall Financial Resource Strain (CARDIA)    Difficulty of Paying Living Expenses: Not hard at all  Recent Concern: Financial Resource Strain - Medium Risk (10/27/2022)   Overall Financial Resource Strain (CARDIA)    Difficulty of Paying Living Expenses: Somewhat hard  Food Insecurity: No Food Insecurity (12/30/2022)   Hunger Vital Sign    Worried About  Running Out of Food in the Last Year: Never true    Ran Out of Food in the Last Year: Never true  Recent Concern: Food Insecurity - Food Insecurity Present (10/27/2022)   Hunger Vital Sign    Worried About Running Out of Food in the Last Year: Sometimes true    Ran Out of Food in the Last Year: Sometimes true  Transportation Needs: No Transportation Needs (12/30/2022)   PRAPARE - Administrator, Civil Service (Medical): No    Lack of Transportation (Non-Medical): No  Physical Activity: Inactive (12/30/2022)   Exercise Vital Sign    Days of Exercise per Week: 0 days    Minutes of Exercise per Session: 0 min  Stress: No Stress Concern Present (12/30/2022)   [employer] of Occupational Health - Occupational Stress Questionnaire    Feeling of Stress : Not at all  Social Connections: Socially Integrated (12/30/2022)   Social Connection and Isolation Panel [NHANES]    Frequency of Communication with Friends and Family: More than three times a week    Frequency of Social Gatherings with Friends and Family: Once a week    Attends Religious Services: 1 to 4 times per year    Active Member of [employer] or Organizations: Yes    Attends Engineer, Structural: More than 4 times per  year    Marital Status: Married  Catering Manager Violence: Not At Risk (12/30/2022)   Humiliation, Afraid, Rape, and Kick questionnaire    Fear of Current or Ex-Partner: No    Emotionally Abused: No    Physically Abused: No    Sexually Abused: No    Outpatient Encounter Medications as of 11/23/2023  Medication Sig   azithromycin  (ZITHROMAX  Z-PAK) 250 MG tablet 2-tabs on the first day and 1-tab daily until done   benzonatate  (TESSALON  PERLES) 100 MG capsule Take 1 capsule (100 mg total) by mouth 3 (three) times daily as needed for cough (cough).   albuterol  (VENTOLIN  HFA) 108 (90 Base) MCG/ACT inhaler Inhale 2 puffs into the lungs every 6 (six) hours as needed for wheezing or shortness of breath.   amitriptyline  (ELAVIL ) 25 MG tablet Take 25-50 mg by mouth See admin instructions. Take 50 mg by mouth at bedtime, may take 25-50 mg during the day if needed for pain   atenolol  (TENORMIN ) 50 MG tablet Take 1 tablet (50 mg total) by mouth 2 (two) times daily.   Bempedoic Acid  (NEXLETOL ) 180 MG TABS Take 180 mg by mouth daily.   busPIRone  (BUSPAR ) 15 MG tablet Take 1 tablet (15 mg total) by mouth 3 (three) times daily.   busPIRone  (BUSPAR ) 5 MG tablet TAKE 1 TABLET BY MOUTH 3 TIMES DAILY. TAKE 1 TABLET WITH THE 15MG  TO EQUAL 20MG .   cetirizine  (ZYRTEC ) 10 MG tablet Take 1 tablet (10 mg total) by mouth daily.   citalopram  (CELEXA ) 40 MG tablet TAKE 1 TABLET BY MOUTH DAILY FOR MOOD   famotidine  (PEPCID ) 20 MG tablet Take 1 tablet (20 mg total) by mouth daily as needed for heartburn or indigestion.   fluticasone  (FLONASE ) 50 MCG/ACT nasal spray SPRAY 2 SPRAYS INTO EACH NOSTRIL EVERY DAY   hydrALAZINE  (APRESOLINE ) 50 MG tablet Take 1 tablet (50 mg total) by mouth in the morning and at bedtime.   hydrochlorothiazide  (HYDRODIURIL ) 25 MG tablet Take 1 tablet (25 mg total) by mouth daily.   latanoprost  (XALATAN ) 0.005 % ophthalmic solution Place 1 drop into both eyes at bedtime.   linaclotide  (LINZESS )  290  MCG CAPS capsule Take 1 capsule (290 mcg total) by mouth daily before breakfast.   lisinopril  (ZESTRIL ) 10 MG tablet Take 0.5-1 tablets (5-10 mg total) by mouth daily. As directed   naloxone (NARCAN) nasal spray 4 mg/0.1 mL SMARTSIG:Both Nares   nitroGLYCERIN  (NITROSTAT ) 0.4 MG SL tablet PLACE 1 TABLET UNDER THE TONGUE EVERY 5 MINUTES AS NEEDED FOR CHEST PAIN.   ondansetron  (ZOFRAN ) 4 MG tablet Take 1 tablet (4 mg total) by mouth every 8 (eight) hours as needed for nausea or vomiting.   Oxycodone  HCl 10 MG TABS Take by mouth.   oxyCODONE -acetaminophen  (PERCOCET) 10-325 MG tablet Take 1 tablet by mouth every 4 (four) hours as needed for pain.   polyethylene glycol powder (MIRALAX ) 17 GM/SCOOP powder Take 17 g by mouth in the morning and at bedtime.   tiZANidine  (ZANAFLEX ) 4 MG tablet Take 1 tablet (4 mg total) by mouth every 8 (eight) hours as needed for muscle spasms.   traZODone  (DESYREL ) 100 MG tablet Take 1.5 tablets (150 mg total) by mouth at bedtime as needed. for sleep   varenicline  (CHANTIX ) 1 MG tablet TAKE 1 TABLET (1 MG TOTAL) BY MOUTH 2 (TWO) TIMES DAILY. START MONTH #2   No facility-administered encounter medications on file as of 11/23/2023.    Allergies  Allergen Reactions   Doxycycline      Other Reaction(s): GI Intolerance   Nsaids Nausea Only    Abdominal pain   Tolmetin     Other Reaction(s): GI Intolerance  Abdominal pain    Review of Systems  Constitutional:  Negative for chills and fever.  HENT:  Positive for sinus pressure and sinus pain. Negative for congestion, postnasal drip, rhinorrhea, sore throat, tinnitus and voice change.   Respiratory:  Positive for cough. Negative for chest tightness, shortness of breath and wheezing.        Green sputum for 2 weeks  Cardiovascular:  Negative for chest pain and palpitations.  Gastrointestinal:  Negative for constipation, diarrhea, nausea and vomiting.  Musculoskeletal:  Negative for myalgias.  Skin:  Negative for color  change and rash.  Allergic/Immunologic: Negative for environmental allergies and food allergies.  Neurological:  Positive for headaches. Negative for dizziness.  Psychiatric/Behavioral:  Negative for confusion and sleep disturbance.     Observations/Objective: No vital signs or physical exam, this was a virtual health encounter.  Pt alert and oriented, answers all questions appropriately, and able to speak in full sentences.    Assessment and Plan: [name] was seen today for cough and headache.  Diagnoses and all orders for this visit:  Acute bacterial sinusitis -     azithromycin  (ZITHROMAX  Z-PAK) 250 MG tablet; 2-tabs on the first day and 1-tab daily until done  Acute cough -     benzonatate  (TESSALON  PERLES) 100 MG capsule; Take 1 capsule (100 mg total) by mouth 3 (three) times daily as needed for cough (cough).   [name] is a 63 year old Caucasian female seen today via telehealth for URI symptoms, no acute distress Acute sinusitis:  Z-pack; continue Flonase  as already prescribed and Cough: Azithromycin  No. 6 dispense client instructed to take 2 tablets today and 1 daily until done; Tessalon  Perles 100 mg 3 times daily as needed for cough instructed to take each tablet with at least 8 ounces of water. Increase hydration; Tylenol /ibuprofen for fever, rest All question answered Follow Up Instructions: Return if symptoms worsen or fail to improve.    I discussed the assessment and treatment plan with the patient.  The patient was provided an opportunity to ask questions and all were answered. The patient agreed with the plan and demonstrated an understanding of the instructions.   The patient was advised to call back or seek an in-person evaluation if the symptoms worsen or if the condition fails to improve as anticipated.  The above assessment and management plan was discussed with the patient. The patient verbalized understanding of and has agreed to the management plan. Patient is  aware to call the clinic if they develop any new symptoms or if symptoms persist or worsen. Patient is aware when to return to the clinic for a follow-up visit. Patient educated on when it is appropriate to go to the emergency department.    I provided 15 minutes of time during this video encounter.   Anslie Spadafora [place] Thompson, DNP [facility] [address] [place], [place] [address] [phone] 11/23/2023

## 2023-12-07 ENCOUNTER — Other Ambulatory Visit: Payer: Self-pay

## 2023-12-07 ENCOUNTER — Emergency Department (HOSPITAL_BASED_OUTPATIENT_CLINIC_OR_DEPARTMENT_OTHER)
Admission: EM | Admit: 2023-12-07 | Discharge: 2023-12-07 | Disposition: A | Discharge status: Home / Self Care | Payer: Medicare HMO | Attending: Emergency Medicine | Admitting: Emergency Medicine

## 2023-12-07 DIAGNOSIS — I10 Essential (primary) hypertension: Secondary | ICD-10-CM | POA: Diagnosis not present

## 2023-12-07 DIAGNOSIS — R1030 Lower abdominal pain, unspecified: Secondary | ICD-10-CM | POA: Insufficient documentation

## 2023-12-07 DIAGNOSIS — Z743 Need for continuous supervision: Secondary | ICD-10-CM | POA: Diagnosis not present

## 2023-12-07 DIAGNOSIS — K5900 Constipation, unspecified: Secondary | ICD-10-CM | POA: Diagnosis not present

## 2023-12-07 LAB — CBC
HCT: 43.3 % (ref 36.0–46.0)
Hemoglobin: 14.1 g/dL (ref 12.0–15.0)
MCH: 28.6 pg (ref 26.0–34.0)
MCHC: 32.6 g/dL (ref 30.0–36.0)
MCV: 87.8 fL (ref 80.0–100.0)
Platelets: 261 10*3/uL (ref 150–400)
RBC: 4.93 MIL/uL (ref 3.87–5.11)
RDW: 12.5 % (ref 11.5–15.5)
WBC: 9.5 10*3/uL (ref 4.0–10.5)
nRBC: 0 % (ref 0.0–0.2)

## 2023-12-07 LAB — COMPREHENSIVE METABOLIC PANEL
ALT: 216 U/L — ABNORMAL HIGH (ref 0–44)
AST: 212 U/L — ABNORMAL HIGH (ref 15–41)
Albumin: 3.9 g/dL (ref 3.5–5.0)
Alkaline Phosphatase: 164 U/L — ABNORMAL HIGH (ref 38–126)
Anion gap: 9 (ref 5–15)
BUN: 15 mg/dL (ref 8–23)
CO2: 29 mmol/L (ref 22–32)
Calcium: 9.7 mg/dL (ref 8.9–10.3)
Chloride: 100 mmol/L (ref 98–111)
Creatinine, Ser: 0.64 mg/dL (ref 0.44–1.00)
GFR, Estimated: >60 mL/min (ref >60)
Glucose, Bld: 119 mg/dL — ABNORMAL HIGH (ref 70–99)
Potassium: 4 mmol/L (ref 3.5–5.1)
Sodium: 138 mmol/L (ref 135–145)
Total Bilirubin: 0.4 mg/dL (ref 0.0–1.2)
Total Protein: 7.4 g/dL (ref 6.5–8.1)

## 2023-12-07 LAB — LIPASE, BLOOD: Lipase: 10 U/L — ABNORMAL LOW (ref 11–51)

## 2023-12-07 NOTE — ED Provider Notes (Signed)
EMERGENCY DEPARTMENT AT [facility] Provider Note   CSN: [id number] Arrival date & time: 12/07/23  4098     History  Chief Complaint  Patient presents with   Abdominal Pain    [name] is a 63 y.o. female.  63 yo F with a chief complaints of difficulty moving her bowels.  This has been going on for about 8 days now.  She has had episodes like this previously.  She has had some lower abdominal discomfort.  Seems to come and go.  She has tried multiple over-the-counter medications without improvement.  Takes MiraLAX every day.  She denies any vomiting denies fevers.  She does have a history of bowel perforation that she says was due to constipation about 4 years ago.  She wanted to try to take care of this problem before it got that advanced.   Abdominal Pain      Home Medications Prior to Admission medications   Medication Sig Start Date End Date Taking? Authorizing Provider  albuterol (VENTOLIN HFA) 108 (90 Base) MCG/ACT inhaler Inhale 2 puffs into the lungs every 6 (six) hours as needed for wheezing or shortness of breath. 08/17/22   Raliegh Ip, DO  amitriptyline (ELAVIL) 25 MG tablet Take 25-50 mg by mouth See admin instructions. Take 50 mg by mouth at bedtime, may take 25-50 mg during the day if needed for pain 08/04/19   [provider]  atenolol (TENORMIN) 50 MG tablet Take 1 tablet (50 mg total) by mouth 2 (two) times daily. 07/05/23 07/05/24  Raliegh Ip, DO  azithromycin (ZITHROMAX Z-PAK) 250 MG tablet 2-tabs on the first day and 1-tab daily until done 11/23/23   Martina Sinner, NP  Bempedoic Acid (NEXLETOL) 180 MG TABS Take 180 mg by mouth daily. 10/27/22   Raliegh Ip, DO  benzonatate (TESSALON PERLES) 100 MG capsule Take 1 capsule (100 mg total) by mouth 3 (three) times daily as needed for cough (cough). 11/23/23   [place] Vena [place], NP  busPIRone (BUSPAR) 15 MG tablet Take 1 tablet (15 mg total)  by mouth 3 (three) times daily. 05/05/23   Raliegh Ip, DO  busPIRone (BUSPAR) 5 MG tablet TAKE 1 TABLET BY MOUTH 3 TIMES DAILY. TAKE 1 TABLET WITH THE 15MG  TO EQUAL 20MG . 05/05/23   Delynn Flavin M, DO  cetirizine (ZYRTEC) 10 MG tablet Take 1 tablet (10 mg total) by mouth daily. 12/07/22   Raliegh Ip, DO  citalopram (CELEXA) 40 MG tablet TAKE 1 TABLET BY MOUTH DAILY FOR MOOD 05/05/23   Delynn Flavin M, DO  famotidine (PEPCID) 20 MG tablet Take 1 tablet (20 mg total) by mouth daily as needed for heartburn or indigestion. 12/09/22   Mansouraty, Netty Starring., MD  fluticasone (FLONASE) 50 MCG/ACT nasal spray SPRAY 2 SPRAYS INTO EACH NOSTRIL EVERY DAY 11/04/23   Delynn Flavin M, DO  hydrALAZINE (APRESOLINE) 50 MG tablet Take 1 tablet (50 mg total) by mouth in the morning and at bedtime. 07/05/23 07/05/24  Raliegh Ip, DO  hydrochlorothiazide (HYDRODIURIL) 25 MG tablet Take 1 tablet (25 mg total) by mouth daily. 07/05/23   Raliegh Ip, DO  latanoprost (XALATAN) 0.005 % ophthalmic solution Place 1 drop into both eyes at bedtime. 12/13/20   [provider]  linaclotide Karlene Einstein) 290 MCG CAPS capsule Take 1 capsule (290 mcg total) by mouth daily before breakfast. 05/05/23   Delynn Flavin M, DO  lisinopril (ZESTRIL) 10 MG tablet  Take 0.5-1 tablets (5-10 mg total) by mouth daily. As directed 04/19/23   Raliegh Ip, DO  naloxone [facility]) nasal spray 4 mg/0.1 mL SMARTSIG:Both Nares 09/11/22   [provider]  nitroGLYCERIN (NITROSTAT) 0.4 MG SL tablet PLACE 1 TABLET UNDER THE TONGUE EVERY 5 MINUTES AS NEEDED FOR CHEST PAIN. 06/04/23   Delynn Flavin M, DO  ondansetron (ZOFRAN) 4 MG tablet Take 1 tablet (4 mg total) by mouth every 8 (eight) hours as needed for nausea or vomiting. 08/17/22   Lenn Sink, DPM  Oxycodone HCl 10 MG TABS Take by mouth. 05/24/20   [provider]  oxyCODONE-acetaminophen (PERCOCET) 10-325 MG tablet Take 1 tablet  by mouth every 4 (four) hours as needed for pain. 08/17/22   Lenn Sink, DPM  polyethylene glycol powder (MIRALAX) 17 GM/SCOOP powder Take 17 g by mouth in the morning and at bedtime. 06/25/21   Raliegh Ip, DO  tiZANidine (ZANAFLEX) 4 MG tablet Take 1 tablet (4 mg total) by mouth every 8 (eight) hours as needed for muscle spasms. 09/09/21   Raliegh Ip, DO  traZODone (DESYREL) 100 MG tablet Take 1.5 tablets (150 mg total) by mouth at bedtime as needed. for sleep 05/05/23   Delynn Flavin M, DO  varenicline (CHANTIX) 1 MG tablet TAKE 1 TABLET (1 MG TOTAL) BY MOUTH 2 (TWO) TIMES DAILY. START MONTH #2 10/01/23   Raliegh Ip, DO      Allergies    Doxycycline, Nsaids, and Tolmetin    Review of Systems   Review of Systems  Gastrointestinal:  Positive for abdominal pain.    Physical Exam Updated Vital Signs BP (!) 157/87 (BP Location: Left Wrist)   Pulse 69   Temp 98.2 F (36.8 C) (Oral)   Resp 12   Ht 5\' 4"  (1.626 m)   Wt 78.5 kg   SpO2 97%   BMI 29.70 kg/m  Physical Exam Vitals and nursing note reviewed.  Constitutional:      General: She is not in acute distress.    Appearance: She is well-developed. She is not diaphoretic.  HENT:     Head: Normocephalic and atraumatic.  Eyes:     Pupils: Pupils are equal, round, and reactive to light.  Cardiovascular:     Rate and Rhythm: Normal rate and regular rhythm.     Heart sounds: No murmur heard.    No friction rub. No gallop.  Pulmonary:     Effort: Pulmonary effort is normal.     Breath sounds: No wheezing or rales.  Abdominal:     General: There is no distension.     Palpations: Abdomen is soft.     Tenderness: There is no abdominal tenderness.  Genitourinary:    Comments: No stool in the vault.  No significant hemorrhoids Musculoskeletal:        General: No tenderness.     Cervical back: Normal range of motion and neck supple.  Skin:    General: Skin is warm and dry.  Neurological:      Mental Status: She is alert and oriented to person, place, and time.  Psychiatric:        Behavior: Behavior normal.     ED Results / Procedures / Treatments   Labs (all labs ordered are listed, but only abnormal results are displayed) Labs Reviewed  LIPASE, BLOOD - Abnormal; Notable for the following components:      Result Value   Lipase 10 (*)  All other components within normal limits  COMPREHENSIVE METABOLIC PANEL - Abnormal; Notable for the following components:   Glucose, Bld 119 (*)    AST 212 (*)    ALT 216 (*)    Alkaline Phosphatase 164 (*)    All other components within normal limits  CBC  URINALYSIS, ROUTINE W REFLEX MICROSCOPIC    EKG None  Radiology No results found.  Procedures Procedures    Medications Ordered in ED Medications - No data to display  ED Course/ Medical Decision Making/ A&P                                 Medical Decision Making Amount and/or Complexity of Data Reviewed Labs: ordered.   64 yo F with a chief complaints of lower abdominal discomfort and difficulty moving her bowels.  This has been going on for about 8 days now.  She has a benign abdominal exam.  No stool in the vault.  She does have a remote history of a bowel perforation she said secondary to constipation.  She has no leukocytosis no vomiting no fevers.  No tachycardia.  I encouraged her to try to clean herself out at home.  Will have her follow-up with her family doctor in the office.  She does have mild LFT elevation without upper abdominal discomfort and a benign exam I am not sure that she benefit from acute imaging.  I encouraged her to follow-up with her family doctor.  7:34 AM:  I have discussed the diagnosis/risks/treatment options with the patient.  Evaluation and diagnostic testing in the emergency department does not suggest an emergent condition requiring admission or immediate intervention beyond what has been performed at this time.  They will follow up  with PCP. We also discussed returning to the ED immediately if new or worsening sx occur. We discussed the sx which are most concerning (e.g., sudden worsening pain, fever, inability to tolerate by mouth) that necessitate immediate return. Medications administered to the patient during their visit and any new prescriptions provided to the patient are listed below.  Medications given during this visit Medications - No data to display   The patient appears reasonably screen and/or stabilized for discharge and I doubt any other medical condition or other [facility] requiring further screening, evaluation, or treatment in the ED at this time prior to discharge.          Final Clinical Impression(s) / ED Diagnoses Final diagnoses:  Lower abdominal pain    Rx / DC Orders ED Discharge Orders     None         Melene Plan, DO 12/07/23 [phone]

## 2023-12-07 NOTE — Discharge Instructions (Addendum)
Take 8 scoops of miralax in 32oz of whatever you would like to drink.(Gatorade comes in this size) You can also use a fleets enema which you can buy over the counter at the pharmacy.  Return for worsening abdominal pain, vomiting or fever.  Your liver function tests are mildly higher than last check.  Please mention this to your family doctor so they can recheck them

## 2023-12-07 NOTE — ED Triage Notes (Signed)
Pt BIB [facility] form home c/o abdominal pain associated with constipation. No vomiting. Last BM 8 days ago.

## 2023-12-16 ENCOUNTER — Telehealth: Payer: Self-pay

## 2023-12-16 DIAGNOSIS — G894 Chronic pain syndrome: Secondary | ICD-10-CM | POA: Diagnosis not present

## 2023-12-16 DIAGNOSIS — M545 Low back pain, unspecified: Secondary | ICD-10-CM | POA: Diagnosis not present

## 2023-12-16 NOTE — Progress Notes (Signed)
 Transition Care Management Follow-up Telephone Call Date of discharge and from where: 12/07/2023 [facility] How have you been since you were released from the hospital? Patient stated she is feeling much better Any questions or concerns? No  Items Reviewed: Did the pt receive and understand the discharge instructions provided? Yes  Medications obtained and verified?  No medication prescribed Other? No  Any new allergies since your discharge? No  Dietary orders reviewed? Yes Do you have support at home? Yes   Follow up appointments reviewed:  PCP Hospital f/u appt confirmed? Yes  Scheduled to see Ashly M. Jolinda, DO on 01/14/2024 @ [facility] Specialist Hospital f/u appt confirmed? No  Scheduled to see  on  @ . Are transportation arrangements needed? No  If their condition worsens, is the pt aware to call PCP or go to the Emergency Dept.? Yes Was the patient provided with contact information for the PCP's office or ED? Yes Was to pt encouraged to call back with questions or concerns? Yes   [name] [facility]  [facility] Guide Direct Dial: [phone]  Fax: [phone] Website: [email or web address]

## 2023-12-18 ENCOUNTER — Other Ambulatory Visit: Payer: Self-pay | Admitting: Orthopedic Surgery

## 2023-12-18 DIAGNOSIS — M545 Low back pain, unspecified: Secondary | ICD-10-CM

## 2023-12-21 NOTE — Discharge Instructions (Signed)

## 2023-12-22 ENCOUNTER — Inpatient Hospital Stay
Admission: RE | Admit: 2023-12-22 | Discharge: 2023-12-22 | Disposition: A | Discharge status: Home / Self Care | Payer: Medicare HMO | Source: Ambulatory Visit | Attending: Orthopedic Surgery | Admitting: Orthopedic Surgery

## 2023-12-22 ENCOUNTER — Inpatient Hospital Stay: Admission: RE | Admit: 2023-12-22 | Payer: Medicare HMO | Source: Ambulatory Visit

## 2023-12-27 NOTE — Discharge Instructions (Signed)

## 2023-12-28 ENCOUNTER — Ambulatory Visit
Admission: RE | Admit: 2023-12-28 | Discharge: 2023-12-28 | Disposition: A | Discharge status: Home / Self Care | Payer: Medicare HMO | Source: Ambulatory Visit | Attending: Orthopedic Surgery | Admitting: Orthopedic Surgery

## 2023-12-28 DIAGNOSIS — M545 Low back pain, unspecified: Secondary | ICD-10-CM

## 2023-12-28 DIAGNOSIS — M47816 Spondylosis without myelopathy or radiculopathy, lumbar region: Secondary | ICD-10-CM | POA: Diagnosis not present

## 2023-12-28 MED ORDER — IOPAMIDOL (ISOVUE-M 200) INJECTION 41%
1.0000 mL | Freq: Once | INTRAMUSCULAR | Status: AC
  Administered 2023-12-28: 1 mL via INTRA_ARTICULAR

## 2023-12-28 MED ORDER — METHYLPREDNISOLONE ACETATE 40 MG/ML INJ SUSP (RADIOLOG
80.0000 mg | Freq: Once | INTRAMUSCULAR | Status: AC
  Administered 2023-12-28: 80 mg via INTRA_ARTICULAR

## 2024-01-07 ENCOUNTER — Other Ambulatory Visit: Payer: Self-pay | Admitting: Family Medicine

## 2024-01-07 DIAGNOSIS — J0100 Acute maxillary sinusitis, unspecified: Secondary | ICD-10-CM

## 2024-01-13 DIAGNOSIS — M4322 Fusion of spine, cervical region: Secondary | ICD-10-CM | POA: Diagnosis not present

## 2024-01-13 DIAGNOSIS — G894 Chronic pain syndrome: Secondary | ICD-10-CM | POA: Diagnosis not present

## 2024-01-13 DIAGNOSIS — M4326 Fusion of spine, lumbar region: Secondary | ICD-10-CM | POA: Diagnosis not present

## 2024-01-13 DIAGNOSIS — M545 Low back pain, unspecified: Secondary | ICD-10-CM | POA: Diagnosis not present

## 2024-01-14 ENCOUNTER — Encounter: Payer: Self-pay | Admitting: Family Medicine

## 2024-01-14 ENCOUNTER — Ambulatory Visit (INDEPENDENT_AMBULATORY_CARE_PROVIDER_SITE_OTHER): Payer: Medicare HMO | Admitting: Family Medicine

## 2024-01-14 ENCOUNTER — Other Ambulatory Visit (HOSPITAL_BASED_OUTPATIENT_CLINIC_OR_DEPARTMENT_OTHER)

## 2024-01-14 VITALS — BP 140/86 | HR 72 | Temp 98.8°F | Ht 64.0 in | Wt 183.2 lb

## 2024-01-14 DIAGNOSIS — R413 Other amnesia: Secondary | ICD-10-CM | POA: Diagnosis not present

## 2024-01-14 DIAGNOSIS — R7309 Other abnormal glucose: Secondary | ICD-10-CM | POA: Diagnosis not present

## 2024-01-14 DIAGNOSIS — R1032 Left lower quadrant pain: Secondary | ICD-10-CM

## 2024-01-14 DIAGNOSIS — R160 Hepatomegaly, not elsewhere classified: Secondary | ICD-10-CM

## 2024-01-14 DIAGNOSIS — R748 Abnormal levels of other serum enzymes: Secondary | ICD-10-CM

## 2024-01-14 DIAGNOSIS — Z87891 Personal history of nicotine dependence: Secondary | ICD-10-CM | POA: Diagnosis not present

## 2024-01-14 LAB — BAYER DCA HB A1C WAIVED: HB A1C (BAYER DCA - WAIVED): 5.7 % — ABNORMAL HIGH (ref 4.8–5.6)

## 2024-01-14 NOTE — Patient Instructions (Signed)
 CT of belly Labs Referral to neurology Referral to gastroenterology

## 2024-01-15 ENCOUNTER — Encounter: Payer: Self-pay | Admitting: Family Medicine

## 2024-01-15 LAB — CMP14+EGFR
ALT: 25 IU/L (ref 0–32)
AST: 19 IU/L (ref 0–40)
Albumin: 4.1 g/dL (ref 3.9–4.9)
Alkaline Phosphatase: 114 IU/L (ref 44–121)
BUN/Creatinine Ratio: 24 (ref 12–28)
BUN: 17 mg/dL (ref 8–27)
Bilirubin Total: 0.3 mg/dL (ref 0.0–1.2)
CO2: 26 mmol/L (ref 20–29)
Calcium: 9.2 mg/dL (ref 8.7–10.3)
Chloride: 99 mmol/L (ref 96–106)
Creatinine, Ser: 0.7 mg/dL (ref 0.57–1.00)
Globulin, Total: 2.6 g/dL (ref 1.5–4.5)
Glucose: 86 mg/dL (ref 70–99)
Potassium: 4.7 mmol/L (ref 3.5–5.2)
Sodium: 139 mmol/L (ref 134–144)
Total Protein: 6.7 g/dL (ref 6.0–8.5)
eGFR: 97 mL/min/{1.73_m2} (ref >59)

## 2024-01-15 NOTE — Progress Notes (Signed)
 Subjective: CC: Interval follow-up PCP: Raliegh Ip, DO [id number] [name] is Betty 63 y.o. female presenting to clinic today for:  1.  Left lower quadrant pain Patient was seen in the ER for left lower quadrant pain.  She is found to have quite Betty bit of stool buildup and was advised to do Betty bowel cleanout, which she has done.  She has been maintaining her bowels with Linzess 290 mcg daily and MiraLAX daily.  She denies any blood in stool.  She also never did follow-up on the elevation in liver enzymes that were noted last year.  Her gastroenterologist, Dr. Christella Hartigan, has retired and she needs Betty referral to Betty new specialist.  She continues to have left lower quadrant abdominal pain despite normal bowel movements now.  She goes on to deny any alcohol use.  No history of hepatitis exposure.  No history of IV drug use.  Occasional marijuana use.  2.  Short-term memory issues She reports some memory issues that have been ongoing for over Betty year now.  She feels like short-term memory has been difficult.  She admits that she was treated with Xanax for many years and is no longer taking this medication but is continued to be treated with opioid.  She denies any family history of Alzheimer's dementia.   ROS: Per HPI  Allergies  Allergen Reactions   Doxycycline     Other Reaction(s): GI Intolerance   Nsaids Nausea Only    Abdominal pain   Tolmetin     Other Reaction(s): GI Intolerance  Abdominal pain   Past Medical History:  Diagnosis Date   Abdominal pain    Abscess, intra-abdominal, postoperative (HCC) 12/31/2020   Acute blood loss anemia (ABLA) 05/04/2021   Acute ITP (HCC) 05/08/2021   Allergy    seasonal   Anemia 2021   Anxiety    Arthritis    Aspiration pneumonia of both lower lobes due to gastric secretions (HCC) 12/22/2020   Blood in stool    Blood transfusion without reported diagnosis 12/21/2020   Breast changes, fibrocystic    Bruises easily    Cardiomegaly 2018    Clotting disorder (HCC)     itp , none since 1976, no current hematologist   Colon perforation due to stercoral ulcer with E. coli fecal peritonitis    Constipation    DDD (degenerative disc disease), cervical    with lumbar issues   Depression    E. coli sepsis -due to fecal peritonitis 12/23/2020   Generalized headaches    GERD (gastroesophageal reflux disease)    Glaucoma    Hemorrhoid    Hyperlipidemia    Hypertension    Idiopathic thrombocytopenia (HCC)    as child   Leg swelling    both ankles   Liver lesion    Nasal congestion    Nausea & vomiting    Neuromuscular disorder (HCC)    back  injury   Neuromuscular disorder (HCC)    3 neck surgeries,neck fusion.pin,plates,screws   Osteoporosis    Panic attacks    pt also  has germaphobia   Rectal bleeding    Seizures (HCC) 1998   stress induced, one time, none since, no seizure meds   Seizures (HCC)    Stated she had 2 weeks ago,it was like Betty transient,stare.04/01/21 pt. denies   Spleen enlarged 1976, none since   r/t idiopathic thrombocytopenia   Stercoral ulcer of large intestine 12/21/2020   Trouble swallowing    Unintentional  weight loss    Weakness    weakness varies    Current Outpatient Medications:    albuterol (VENTOLIN HFA) 108 (90 Base) MCG/ACT inhaler, Inhale 2 puffs into the lungs every 6 (six) hours as needed for wheezing or shortness of breath., Disp: 8 g, Rfl: 0   amitriptyline (ELAVIL) 25 MG tablet, Take 25-50 mg by mouth See admin instructions. Take 50 mg by mouth at bedtime, may take 25-50 mg during the day if needed for pain, Disp: , Rfl:    atenolol (TENORMIN) 50 MG tablet, Take 1 tablet (50 mg total) by mouth 2 (two) times daily., Disp: 180 tablet, Rfl: 3   Bempedoic Acid (NEXLETOL) 180 MG TABS, Take 180 mg by mouth daily., Disp: 90 tablet, Rfl: 3   busPIRone (BUSPAR) 15 MG tablet, Take 1 tablet (15 mg total) by mouth 3 (three) times daily., Disp: 270 tablet, Rfl: 3   busPIRone (BUSPAR) 5 MG  tablet, TAKE 1 TABLET BY MOUTH 3 TIMES DAILY. TAKE 1 TABLET WITH THE 15MG  TO EQUAL 20MG ., Disp: 270 tablet, Rfl: 3   cetirizine (ZYRTEC) 10 MG tablet, Take 1 tablet (10 mg total) by mouth daily., Disp: 90 tablet, Rfl: 3   citalopram (CELEXA) 40 MG tablet, TAKE 1 TABLET BY MOUTH DAILY FOR MOOD, Disp: 90 tablet, Rfl: 3   famotidine (PEPCID) 20 MG tablet, Take 1 tablet (20 mg total) by mouth daily as needed for heartburn or indigestion., Disp: 30 tablet, Rfl: 6   fluticasone (FLONASE) 50 MCG/ACT nasal spray, SPRAY 2 SPRAYS INTO EACH NOSTRIL EVERY DAY, Disp: 48 mL, Rfl: 1   hydrALAZINE (APRESOLINE) 50 MG tablet, Take 1 tablet (50 mg total) by mouth in the morning and at bedtime., Disp: 180 tablet, Rfl: 3   hydrochlorothiazide (HYDRODIURIL) 25 MG tablet, Take 1 tablet (25 mg total) by mouth daily., Disp: 90 tablet, Rfl: 3   latanoprost (XALATAN) 0.005 % ophthalmic solution, Place 1 drop into both eyes at bedtime., Disp: , Rfl:    linaclotide (LINZESS) 290 MCG CAPS capsule, Take 1 capsule (290 mcg total) by mouth daily before breakfast., Disp: 90 capsule, Rfl: 3   lisinopril (ZESTRIL) 10 MG tablet, Take 0.5-1 tablets (5-10 mg total) by mouth daily. As directed, Disp: 90 tablet, Rfl: 3   naloxone (NARCAN) nasal spray 4 mg/0.1 mL, SMARTSIG:Both Nares, Disp: , Rfl:    nitroGLYCERIN (NITROSTAT) 0.4 MG SL tablet, PLACE 1 TABLET UNDER THE TONGUE EVERY 5 MINUTES AS NEEDED FOR CHEST PAIN., Disp: 25 tablet, Rfl: 2   ondansetron (ZOFRAN) 4 MG tablet, Take 1 tablet (4 mg total) by mouth every 8 (eight) hours as needed for nausea or vomiting., Disp: 20 tablet, Rfl: 0   Oxycodone HCl 10 MG TABS, Take by mouth., Disp: , Rfl:    oxyCODONE-acetaminophen (PERCOCET) 10-325 MG tablet, Take 1 tablet by mouth every 4 (four) hours as needed for pain., Disp: 20 tablet, Rfl: 0   polyethylene glycol powder (MIRALAX) 17 GM/SCOOP powder, Take 17 g by mouth in the morning and at bedtime., Disp: 225 g, Rfl: PRN   tiZANidine (ZANAFLEX)  4 MG tablet, Take 1 tablet (4 mg total) by mouth every 8 (eight) hours as needed for muscle spasms., Disp: 30 tablet, Rfl: 1   traZODone (DESYREL) 100 MG tablet, Take 1.5 tablets (150 mg total) by mouth at bedtime as needed. for sleep, Disp: 135 tablet, Rfl: 3 Social History   Socioeconomic History   Marital status: Married    Spouse name: Casimiro Needle   Number  of children: 3   Years of education: 14   Highest education level: Associate degree: academic program  Occupational History   Occupation: Sports coach: UNEMPLOYED  Tobacco Use   Smoking status: Every Day    Current packs/day: 1.00    Average packs/day: 1 pack/day for 10.0 years (10.0 ttl pk-yrs)    Types: Cigarettes   Smokeless tobacco: Never   Tobacco comments:    Started 2007  Vaping Use   Vaping status: Former   Quit date: 09/07/2016   Devices: only used for about Betty month  Substance and Sexual Activity   Alcohol use: No   Drug use: Yes    Types: Marijuana   Sexual activity: Not Currently  Other Topics Concern   Not on file  Social History Narrative   She lives with her boyfriend.  She used to smoke, quit May 2013, she is not drinking. She has 3 children   Social Drivers of Corporate investment banker Strain: Low Risk  (12/16/2023)   Overall Financial Resource Strain (CARDIA)    Difficulty of Paying Living Expenses: Not very hard  Food Insecurity: No Food Insecurity (12/16/2023)   Hunger Vital Sign    Worried About Running Out of Food in the Last Year: Never true    Ran Out of Food in the Last Year: Never true  Transportation Needs: No Transportation Needs (12/16/2023)   PRAPARE - Administrator, Civil Service (Medical): No    Lack of Transportation (Non-Medical): No  Physical Activity: Inactive (12/30/2022)   Exercise Vital Sign    Days of Exercise per Week: 0 days    Minutes of Exercise per Session: 0 min  Stress: No Stress Concern Present (12/30/2022)   [employer] of Occupational Health -  Occupational Stress Questionnaire    Feeling of Stress : Not at all  Social Connections: Socially Integrated (12/30/2022)   Social Connection and Isolation Panel [NHANES]    Frequency of Communication with Friends and Family: More than three times Betty week    Frequency of Social Gatherings with Friends and Family: Once Betty week    Attends Religious Services: 1 to 4 times per year    Active Member of [employer] or Organizations: Yes    Attends Engineer, structural: More than 4 times per year    Marital Status: Married  Catering manager Violence: Not At Risk (12/30/2022)   Humiliation, Afraid, Rape, and Kick questionnaire    Fear of Current or Ex-Partner: No    Emotionally Abused: No    Physically Abused: No    Sexually Abused: No   Family History  Problem Relation Age of Onset   Hypertension Mother    Heart disease Mother    Anxiety disorder Mother    Depression Mother    Lung cancer Father    Cancer Father        lung   Alcohol abuse Father    Diabetes Sister    Bipolar disorder Sister    Anxiety disorder Sister    Heart disease Sister        Congestive Heart Failure   Hypertension Sister    Depression Sister    Arthritis Paternal Uncle    Diabetes Maternal Grandmother    Heart disease Maternal Grandmother    Colon cancer Paternal Grandmother    Cancer Paternal Grandmother        colon   Esophageal cancer Neg Hx    Rectal cancer Neg Hx  Stomach cancer Neg Hx    Breast cancer Neg Hx     Objective: Office vital signs reviewed. BP (!) 140/86   Pulse 72   Temp 98.8 F (37.1 C)   Ht 5\' 4"  (1.626 m)   Wt 183 lb 3.2 oz (83.1 kg)   SpO2 95%   BMI 31.45 kg/m   Physical Examination:  General: Awake, alert, well nourished, No acute distress HEENT sclera white.  Moist mucous membranes.  Few xanthelasmas noted on the eyelids Cardio: regular rate and rhythm, S1S2 heard, no murmurs appreciated Pulm: clear to auscultation bilaterally, no wheezes, rhonchi or rales; normal  work of breathing on room air GI: Soft, nondistended.  She does have tenderness to palpation in the left lower quadrant of the abdomen with no guarding or rebound.     01/14/2024    2:14 PM 07/05/2023    9:43 AM 05/05/2023   12:03 PM  Depression screen PHQ 2/9  Decreased Interest 0 0 2  Down, Depressed, Hopeless 0 0 2  PHQ - 2 Score 0 0 4  Altered sleeping 0  0  Tired, decreased energy 0  1  Change in appetite 0  1  Feeling bad or failure about yourself  0  1  Trouble concentrating 0  2  Moving slowly or fidgety/restless 0  0  Suicidal thoughts 0  0  PHQ-9 Score 0  9  Difficult doing work/chores Not difficult at all  Very difficult      01/14/2024    2:14 PM 07/05/2023    9:43 AM 05/05/2023   12:04 PM 08/17/2022    2:40 PM  GAD 7 : Generalized Anxiety Score  Nervous, Anxious, on Edge 0 0 3 1  Control/stop worrying 0 0 3 2  Worry too much - different things 0 0 3 1  Trouble relaxing 0 0 2 1  Restless 0 0 2 1  Easily annoyed or irritable 0 0 2 1  Afraid - awful might happen 0 0 2 1  Total GAD 7 Score 0 0 17 8  Anxiety Difficulty Not difficult at all Not difficult at all Somewhat difficult Not difficult at all    Assessment/ Plan: 63 y.o. female   LLQ abdominal pain - Plan: Ambulatory referral to Gastroenterology, CT ABDOMEN PELVIS W CONTRAST  Hepatomegaly - Plan: Ambulatory referral to Gastroenterology, CT ABDOMEN PELVIS W CONTRAST  Elevated liver enzymes - Plan: CMP14+EGFR, Ambulatory referral to Gastroenterology, CT ABDOMEN PELVIS W CONTRAST  Short-term memory loss - Plan: Ambulatory referral to Neurology  Former smoker  High glucose level - Plan: Bayer DCA Hb A1c Waived, CMP14+EGFR  Uncertain etiology of persistent left lower quadrant abdominal pain.  I have placed referral back to [facility] to reestablish care.  Will also obtain CT abdomen pelvis with contrast to further evaluate this left lower quadrant abdominal pain and hopefully explore these elevations in liver  enzymes that are persistent without apparent etiology.  Her exam was notable for tenderness to palpation in the left lower quadrant but otherwise unremarkable.  Repeat liver function tests  Referral to neurology for further exploration of short-term memory loss.  Possibly related to history of chronic benzodiazepine use.  No known family history of Alzheimer's dementia  I congratulated her on smoking cessation and have updated her chart with this information  A1c did not demonstrate any prediabetes or diabetes   Raliegh Ip, DO [facility] [phone]

## 2024-01-18 ENCOUNTER — Ambulatory Visit (HOSPITAL_BASED_OUTPATIENT_CLINIC_OR_DEPARTMENT_OTHER)
Admission: RE | Admit: 2024-01-18 | Discharge: 2024-01-18 | Disposition: A | Discharge status: Home / Self Care | Source: Ambulatory Visit | Attending: Family Medicine | Admitting: Family Medicine

## 2024-01-18 DIAGNOSIS — R748 Abnormal levels of other serum enzymes: Secondary | ICD-10-CM | POA: Diagnosis not present

## 2024-01-18 DIAGNOSIS — R160 Hepatomegaly, not elsewhere classified: Secondary | ICD-10-CM | POA: Insufficient documentation

## 2024-01-18 DIAGNOSIS — R1032 Left lower quadrant pain: Secondary | ICD-10-CM | POA: Insufficient documentation

## 2024-01-18 DIAGNOSIS — Z9049 Acquired absence of other specified parts of digestive tract: Secondary | ICD-10-CM | POA: Diagnosis not present

## 2024-01-18 DIAGNOSIS — Z90710 Acquired absence of both cervix and uterus: Secondary | ICD-10-CM | POA: Diagnosis not present

## 2024-01-18 MED ORDER — IOHEXOL 300 MG/ML  SOLN
100.0000 mL | Freq: Once | INTRAMUSCULAR | Status: AC | PRN
  Administered 2024-01-18: 100 mL via INTRAVENOUS

## 2024-01-24 ENCOUNTER — Encounter: Payer: Self-pay | Admitting: Physician Assistant

## 2024-01-25 ENCOUNTER — Encounter: Payer: Self-pay | Admitting: Family Medicine

## 2024-01-25 DIAGNOSIS — Z87891 Personal history of nicotine dependence: Secondary | ICD-10-CM

## 2024-02-03 DIAGNOSIS — G894 Chronic pain syndrome: Secondary | ICD-10-CM | POA: Diagnosis not present

## 2024-02-03 DIAGNOSIS — M4326 Fusion of spine, lumbar region: Secondary | ICD-10-CM | POA: Diagnosis not present

## 2024-02-23 DIAGNOSIS — R9389 Abnormal findings on diagnostic imaging of other specified body structures: Secondary | ICD-10-CM | POA: Diagnosis not present

## 2024-02-23 DIAGNOSIS — R1032 Left lower quadrant pain: Secondary | ICD-10-CM | POA: Diagnosis not present

## 2024-02-23 DIAGNOSIS — K5904 Chronic idiopathic constipation: Secondary | ICD-10-CM | POA: Diagnosis not present

## 2024-02-23 DIAGNOSIS — R1310 Dysphagia, unspecified: Secondary | ICD-10-CM | POA: Diagnosis not present

## 2024-02-23 DIAGNOSIS — K219 Gastro-esophageal reflux disease without esophagitis: Secondary | ICD-10-CM | POA: Diagnosis not present

## 2024-02-29 ENCOUNTER — Other Ambulatory Visit: Payer: Self-pay | Admitting: Family Medicine

## 2024-02-29 DIAGNOSIS — F5104 Psychophysiologic insomnia: Secondary | ICD-10-CM

## 2024-02-29 DIAGNOSIS — F411 Generalized anxiety disorder: Secondary | ICD-10-CM

## 2024-03-03 ENCOUNTER — Encounter: Payer: Self-pay | Admitting: Physician Assistant

## 2024-03-03 ENCOUNTER — Encounter

## 2024-03-03 DIAGNOSIS — Z79891 Long term (current) use of opiate analgesic: Secondary | ICD-10-CM | POA: Diagnosis not present

## 2024-03-03 DIAGNOSIS — M4326 Fusion of spine, lumbar region: Secondary | ICD-10-CM | POA: Diagnosis not present

## 2024-03-03 DIAGNOSIS — M545 Low back pain, unspecified: Secondary | ICD-10-CM | POA: Diagnosis not present

## 2024-03-03 DIAGNOSIS — G894 Chronic pain syndrome: Secondary | ICD-10-CM | POA: Diagnosis not present

## 2024-03-03 DIAGNOSIS — Z79899 Other long term (current) drug therapy: Secondary | ICD-10-CM | POA: Diagnosis not present

## 2024-03-07 ENCOUNTER — Ambulatory Visit: Payer: Medicare HMO | Admitting: Plastic Surgery

## 2024-03-07 ENCOUNTER — Encounter: Payer: Self-pay | Admitting: Plastic Surgery

## 2024-03-07 VITALS — BP 178/91 | HR 80 | Ht 64.0 in | Wt 194.2 lb

## 2024-03-07 DIAGNOSIS — M419 Scoliosis, unspecified: Secondary | ICD-10-CM | POA: Diagnosis not present

## 2024-03-07 DIAGNOSIS — M4800 Spinal stenosis, site unspecified: Secondary | ICD-10-CM | POA: Diagnosis not present

## 2024-03-07 DIAGNOSIS — N62 Hypertrophy of breast: Secondary | ICD-10-CM | POA: Diagnosis not present

## 2024-03-07 DIAGNOSIS — M5030 Other cervical disc degeneration, unspecified cervical region: Secondary | ICD-10-CM | POA: Diagnosis not present

## 2024-03-07 NOTE — Progress Notes (Signed)
   Subjective:    Patient ID: [name], female    DOB: 01/04/1961, 63 y.o.   MRN: [id number]  The patient is a 63 year old female here for further evaluation of her breast.  She complains about neck and back pain.  She has grooves in her shoulder due to her bra straps.  She has unable to find relief with over-the-counter medications.  She is 5 feet 4 inches tall and weighs 193 pounds.  This makes her body mass index 33 kg/m.  This weight is up from her last visit from September.  The amount of tissue required for insurance coverage would be 550 to 600 g off of each side.  She still complains of rash and skin breakdown that is not relieved by conservative treatment.  The patient states that she quit smoking.  The pain is compounded by the fact that she has spine issues that have been well-documented.      Review of Systems  Constitutional: Negative.   HENT: Negative.    Eyes: Negative.   Respiratory: Negative.    Cardiovascular: Negative.   Gastrointestinal: Negative.   Endocrine: Negative.   Genitourinary: Negative.   Musculoskeletal:  Positive for back pain and neck pain.  Skin:  Positive for rash.       Objective:   Physical Exam Vitals reviewed.  Constitutional:      Appearance: Normal appearance.  HENT:     Head: Atraumatic.  Cardiovascular:     Rate and Rhythm: Normal rate.     Pulses: Normal pulses.  Pulmonary:     Effort: Pulmonary effort is normal.  Abdominal:     General: There is no distension.     Palpations: Abdomen is soft.     Tenderness: There is no abdominal tenderness.  Skin:    General: Skin is warm.     Capillary Refill: Capillary refill takes less than 2 seconds.  Neurological:     Mental Status: She is alert and oriented to person, place, and time.  Psychiatric:        Mood and Affect: Mood normal.        Behavior: Behavior normal.        Thought Content: Thought content normal.        Judgment: Judgment normal.         Assessment &  Plan:     ICD-10-CM   1. Scoliosis, unspecified scoliosis type, unspecified spinal region  M41.9     2. DEGENERATIVE DISC DISEASE, CERVICAL SPINE  M50.30     3. Spinal stenosis, unspecified spinal region  M48.00     4. Symptomatic mammary hypertrophy  N62        The patient is a good candidate for bilateral breast reduction with liposuction and we will see if we can get this covered through her insurance.  In the meantime I have requested that she cut back on her carbs in the sugars because if she keeps gaining weight is going to change her overall results.  Pictures were obtained of the patient and placed in the chart with the patient's or guardian's permission.

## 2024-03-08 ENCOUNTER — Other Ambulatory Visit: Payer: Self-pay | Admitting: Family Medicine

## 2024-03-08 DIAGNOSIS — F411 Generalized anxiety disorder: Secondary | ICD-10-CM

## 2024-03-08 DIAGNOSIS — F331 Major depressive disorder, recurrent, moderate: Secondary | ICD-10-CM

## 2024-03-08 DIAGNOSIS — I10 Essential (primary) hypertension: Secondary | ICD-10-CM

## 2024-03-09 ENCOUNTER — Other Ambulatory Visit: Payer: Self-pay | Admitting: Family Medicine

## 2024-03-09 DIAGNOSIS — F411 Generalized anxiety disorder: Secondary | ICD-10-CM

## 2024-03-09 DIAGNOSIS — F331 Major depressive disorder, recurrent, moderate: Secondary | ICD-10-CM

## 2024-03-09 DIAGNOSIS — I10 Essential (primary) hypertension: Secondary | ICD-10-CM

## 2024-03-09 NOTE — Telephone Encounter (Signed)
 Covering for PCP, will provide one refill.

## 2024-03-18 ENCOUNTER — Encounter: Payer: Self-pay | Admitting: Family Medicine

## 2024-03-20 DIAGNOSIS — R933 Abnormal findings on diagnostic imaging of other parts of digestive tract: Secondary | ICD-10-CM | POA: Diagnosis not present

## 2024-03-20 DIAGNOSIS — D122 Benign neoplasm of ascending colon: Secondary | ICD-10-CM | POA: Diagnosis not present

## 2024-03-20 DIAGNOSIS — R1310 Dysphagia, unspecified: Secondary | ICD-10-CM | POA: Diagnosis not present

## 2024-03-20 DIAGNOSIS — Z980 Intestinal bypass and anastomosis status: Secondary | ICD-10-CM | POA: Diagnosis not present

## 2024-03-20 DIAGNOSIS — K2950 Unspecified chronic gastritis without bleeding: Secondary | ICD-10-CM | POA: Diagnosis not present

## 2024-03-20 DIAGNOSIS — D123 Benign neoplasm of transverse colon: Secondary | ICD-10-CM | POA: Diagnosis not present

## 2024-03-20 DIAGNOSIS — D125 Benign neoplasm of sigmoid colon: Secondary | ICD-10-CM | POA: Diagnosis not present

## 2024-03-20 DIAGNOSIS — R1032 Left lower quadrant pain: Secondary | ICD-10-CM | POA: Diagnosis not present

## 2024-03-20 LAB — HM COLONOSCOPY

## 2024-03-23 ENCOUNTER — Other Ambulatory Visit: Payer: Self-pay | Admitting: Orthopedic Surgery

## 2024-03-23 DIAGNOSIS — G894 Chronic pain syndrome: Secondary | ICD-10-CM

## 2024-03-29 ENCOUNTER — Other Ambulatory Visit: Payer: Self-pay | Admitting: Family Medicine

## 2024-03-29 DIAGNOSIS — I10 Essential (primary) hypertension: Secondary | ICD-10-CM

## 2024-03-30 ENCOUNTER — Encounter: Payer: Self-pay | Admitting: Orthopedic Surgery

## 2024-04-04 NOTE — Discharge Instructions (Signed)

## 2024-04-05 ENCOUNTER — Inpatient Hospital Stay: Admission: RE | Admit: 2024-04-05 | Source: Ambulatory Visit

## 2024-04-05 ENCOUNTER — Inpatient Hospital Stay
Admission: RE | Admit: 2024-04-05 | Discharge: 2024-04-05 | Disposition: A | Discharge status: Home / Self Care | Source: Ambulatory Visit | Attending: Orthopedic Surgery | Admitting: Orthopedic Surgery

## 2024-04-07 DIAGNOSIS — G894 Chronic pain syndrome: Secondary | ICD-10-CM | POA: Diagnosis not present

## 2024-04-07 DIAGNOSIS — Z981 Arthrodesis status: Secondary | ICD-10-CM | POA: Diagnosis not present

## 2024-04-07 DIAGNOSIS — M47816 Spondylosis without myelopathy or radiculopathy, lumbar region: Secondary | ICD-10-CM | POA: Diagnosis not present

## 2024-04-07 DIAGNOSIS — M545 Low back pain, unspecified: Secondary | ICD-10-CM | POA: Diagnosis not present

## 2024-04-07 DIAGNOSIS — G8929 Other chronic pain: Secondary | ICD-10-CM | POA: Diagnosis not present

## 2024-04-12 ENCOUNTER — Other Ambulatory Visit

## 2024-04-16 ENCOUNTER — Other Ambulatory Visit: Payer: Self-pay | Admitting: Family Medicine

## 2024-04-16 DIAGNOSIS — I10 Essential (primary) hypertension: Secondary | ICD-10-CM

## 2024-04-16 DIAGNOSIS — F411 Generalized anxiety disorder: Secondary | ICD-10-CM

## 2024-04-18 ENCOUNTER — Telehealth: Payer: Self-pay

## 2024-04-18 NOTE — Telephone Encounter (Signed)
 Let pt know that this was the only pharmacy that we have on file for her and that refill requests yesterday came from them

## 2024-04-18 NOTE — Telephone Encounter (Signed)
 Copied from CRM [id number]. Topic: General - Other >> Apr 17, 2024  4:48 PM [name] wrote: Reason for CRM: Patient wants to verify that all [facility] [place]

## 2024-04-20 ENCOUNTER — Other Ambulatory Visit: Payer: Self-pay | Admitting: Family Medicine

## 2024-04-20 ENCOUNTER — Encounter: Payer: Self-pay | Admitting: Family Medicine

## 2024-04-20 DIAGNOSIS — I10 Essential (primary) hypertension: Secondary | ICD-10-CM

## 2024-04-20 NOTE — Telephone Encounter (Signed)
 [place] pt NTBS 30-d given 03/29/24

## 2024-04-20 NOTE — Telephone Encounter (Signed)
 Letter sent.

## 2024-04-20 NOTE — Telephone Encounter (Signed)
 R/C lmtcb  Copied from CRM [id number]. Topic: General - Other >> Apr 20, 2024  9:48 AM Felizardo Hotter wrote: Reason for CRM: Pt returning Bynum Cassis call provided information. Pt has no further questions.

## 2024-04-22 ENCOUNTER — Other Ambulatory Visit: Payer: Self-pay | Admitting: Family Medicine

## 2024-04-22 DIAGNOSIS — I10 Essential (primary) hypertension: Secondary | ICD-10-CM

## 2024-04-24 NOTE — Telephone Encounter (Signed)
 [place]chalk pt NTBS 30-d given 04/20/24

## 2024-04-24 NOTE — Telephone Encounter (Signed)
Left message for patient to call back to make an appt

## 2024-05-02 ENCOUNTER — Ambulatory Visit (INDEPENDENT_AMBULATORY_CARE_PROVIDER_SITE_OTHER): Admitting: Family Medicine

## 2024-05-02 ENCOUNTER — Ambulatory Visit

## 2024-05-02 ENCOUNTER — Telehealth: Payer: Self-pay

## 2024-05-02 ENCOUNTER — Encounter: Payer: Self-pay | Admitting: Family Medicine

## 2024-05-02 VITALS — BP 132/81 | HR 84 | Temp 98.6°F | Ht 64.0 in | Wt 203.2 lb

## 2024-05-02 DIAGNOSIS — Z87891 Personal history of nicotine dependence: Secondary | ICD-10-CM | POA: Diagnosis not present

## 2024-05-02 DIAGNOSIS — K5909 Other constipation: Secondary | ICD-10-CM

## 2024-05-02 DIAGNOSIS — J40 Bronchitis, not specified as acute or chronic: Secondary | ICD-10-CM

## 2024-05-02 DIAGNOSIS — R7303 Prediabetes: Secondary | ICD-10-CM | POA: Diagnosis not present

## 2024-05-02 DIAGNOSIS — I10 Essential (primary) hypertension: Secondary | ICD-10-CM | POA: Diagnosis not present

## 2024-05-02 DIAGNOSIS — E669 Obesity, unspecified: Secondary | ICD-10-CM

## 2024-05-02 DIAGNOSIS — F411 Generalized anxiety disorder: Secondary | ICD-10-CM | POA: Diagnosis not present

## 2024-05-02 DIAGNOSIS — Z23 Encounter for immunization: Secondary | ICD-10-CM | POA: Diagnosis not present

## 2024-05-02 DIAGNOSIS — R0683 Snoring: Secondary | ICD-10-CM | POA: Diagnosis not present

## 2024-05-02 DIAGNOSIS — F331 Major depressive disorder, recurrent, moderate: Secondary | ICD-10-CM

## 2024-05-02 LAB — BAYER DCA HB A1C WAIVED: HB A1C (BAYER DCA - WAIVED): 5.7 % — ABNORMAL HIGH (ref 4.8–5.6)

## 2024-05-02 MED ORDER — CITALOPRAM HYDROBROMIDE 40 MG PO TABS: ORAL_TABLET | ORAL | 4 refills | Status: AC

## 2024-05-02 MED ORDER — BUSPIRONE HCL 15 MG PO TABS: 15.0000 mg | ORAL_TABLET | Freq: Three times a day (TID) | ORAL | 4 refills | Status: AC

## 2024-05-02 MED ORDER — ALBUTEROL SULFATE HFA 108 (90 BASE) MCG/ACT IN AERS: 2.0000 | INHALATION_SPRAY | Freq: Four times a day (QID) | RESPIRATORY_TRACT | 1 refills | Status: AC | PRN

## 2024-05-02 MED ORDER — BUSPIRONE HCL 5 MG PO TABS: ORAL_TABLET | ORAL | Status: DC

## 2024-05-02 MED ORDER — ATENOLOL 50 MG PO TABS: 50.0000 mg | ORAL_TABLET | Freq: Two times a day (BID) | ORAL | 4 refills | Status: AC

## 2024-05-02 MED ORDER — HYDRALAZINE HCL 50 MG PO TABS: 50.0000 mg | ORAL_TABLET | Freq: Two times a day (BID) | ORAL | 4 refills | Status: AC

## 2024-05-02 MED ORDER — FAMOTIDINE 20 MG PO TABS: 20.0000 mg | ORAL_TABLET | Freq: Every day | ORAL | Status: AC | PRN

## 2024-05-02 MED ORDER — LISINOPRIL 10 MG PO TABS: 5.0000 mg | ORAL_TABLET | Freq: Every day | ORAL | 4 refills | Status: AC

## 2024-05-02 MED ORDER — LINACLOTIDE 290 MCG PO CAPS: 290.0000 ug | ORAL_CAPSULE | Freq: Every day | ORAL | 4 refills | Status: AC

## 2024-05-02 NOTE — Progress Notes (Signed)
 Subjective: CC: Hypertension PCP: Jolinda Norene HERO, DO YEP:[name] is a 63 y.o. female presenting to clinic today for:  1.  Hypertension associated with obesity and hyperlipidemia Patient is compliant with her atenolol , lisinopril .  She is no longer on Nexletol  due to intolerance.  She has reconciled with her husband.  She reports no chest pain, shortness of breath.  She has gained weight however and she is worried about this.  Wants know if she is a candidate for the weight loss shot.  No history of MI, CVA, TIA.  No known sleep apnea but she does report that she snores.  2.  GERD, constipation She reports stability with Pepcid , Linzess .  Needs refills.   ROS: Per HPI  Allergies  Allergen Reactions   Doxycycline      Other Reaction(s): GI Intolerance   Nsaids Nausea Only    Abdominal pain   Tolmetin     Other Reaction(s): GI Intolerance  Abdominal pain   Past Medical History:  Diagnosis Date   Abdominal pain    Abscess, intra-abdominal, postoperative (HCC) 12/31/2020   Acute blood loss anemia (ABLA) 05/04/2021   Acute ITP (HCC) 05/08/2021   Allergy    seasonal   Anemia 2021   Anxiety    Arthritis    Aspiration pneumonia of both lower lobes due to gastric secretions (HCC) 12/22/2020   Blood in stool    Blood transfusion without reported diagnosis 12/21/2020   Breast changes, fibrocystic    Bruises easily    Cardiomegaly 2018   Clotting disorder (HCC)     itp , none since 1976, no current hematologist   Colon perforation due to stercoral ulcer with E. coli fecal peritonitis    Constipation    DDD (degenerative disc disease), cervical    with lumbar issues   Depression    E. coli sepsis -due to fecal peritonitis 12/23/2020   Generalized headaches    GERD (gastroesophageal reflux disease)    Glaucoma    Hemorrhoid    Hyperlipidemia    Hypertension    Idiopathic thrombocytopenia (HCC)    as child   Leg swelling    both ankles   Liver lesion     Nasal congestion    Nausea & vomiting    Neuromuscular disorder (HCC)    back  injury   Neuromuscular disorder (HCC)    3 neck surgeries,neck fusion.pin,plates,screws   Osteoporosis    Panic attacks    pt also  has germaphobia   Rectal bleeding    Seizures (HCC) 1998   stress induced, one time, none since, no seizure meds   Seizures (HCC)    Stated she had 2 weeks ago,it was like a transient,stare.04/01/21 pt. denies   Spleen enlarged 1976, none since   r/t idiopathic thrombocytopenia   Stercoral ulcer of large intestine 12/21/2020   Trouble swallowing    Unintentional weight loss    Weakness    weakness varies    Current Outpatient Medications:    albuterol  (VENTOLIN  HFA) 108 (90 Base) MCG/ACT inhaler, Inhale 2 puffs into the lungs every 6 (six) hours as needed for wheezing or shortness of breath., Disp: 8 g, Rfl: 1   amitriptyline  (ELAVIL ) 25 MG tablet, Take 25-50 mg by mouth See admin instructions. Take 50 mg by mouth at bedtime, may take 25-50 mg during the day if needed for pain, Disp: , Rfl:    atenolol  (TENORMIN ) 50 MG tablet, Take 1 tablet (50 mg total) by mouth 2 (  two) times daily., Disp: 180 tablet, Rfl: 4   busPIRone  (BUSPAR ) 15 MG tablet, Take 1 tablet (15 mg total) by mouth 3 (three) times daily. With 5mg  to equal 20mg , Disp: 270 tablet, Rfl: 4   busPIRone  (BUSPAR ) 5 MG tablet, TAKE 1 TABLET BY MOUTH 3 TIMES DAILY. TAKE 1 TABLET WITH THE 15MG  TO EQUAL 20MG , Disp: 90 tablet, Rfl: 04   cetirizine  (ZYRTEC ) 10 MG tablet, Take 1 tablet (10 mg total) by mouth daily., Disp: 90 tablet, Rfl: 3   citalopram  (CELEXA ) 40 MG tablet, TAKE 1 TABLET BY MOUTH DAILY FOR MOOD, Disp: 90 tablet, Rfl: 4   famotidine  (PEPCID ) 20 MG tablet, Take 1 tablet (20 mg total) by mouth daily as needed for heartburn or indigestion., Disp: , Rfl:    fluticasone  (FLONASE ) 50 MCG/ACT nasal spray, SPRAY 2 SPRAYS INTO EACH NOSTRIL EVERY DAY, Disp: 48 mL, Rfl: 1   hydrALAZINE  (APRESOLINE ) 50 MG tablet, Take 1  tablet (50 mg total) by mouth in the morning and at bedtime., Disp: 180 tablet, Rfl: 4   latanoprost  (XALATAN ) 0.005 % ophthalmic solution, Place 1 drop into both eyes at bedtime., Disp: , Rfl:    linaclotide  (LINZESS ) 290 MCG CAPS capsule, Take 1 capsule (290 mcg total) by mouth daily before breakfast., Disp: 90 capsule, Rfl: 4   lisinopril  (ZESTRIL ) 10 MG tablet, Take 0.5-1 tablets (5-10 mg total) by mouth daily., Disp: 90 tablet, Rfl: 4   naloxone (NARCAN) nasal spray 4 mg/0.1 mL, SMARTSIG:Both Nares, Disp: , Rfl:    nitroGLYCERIN  (NITROSTAT ) 0.4 MG SL tablet, PLACE 1 TABLET UNDER THE TONGUE EVERY 5 MINUTES AS NEEDED FOR CHEST PAIN., Disp: 25 tablet, Rfl: 2   ondansetron  (ZOFRAN ) 4 MG tablet, Take 1 tablet (4 mg total) by mouth every 8 (eight) hours as needed for nausea or vomiting., Disp: 20 tablet, Rfl: 0   Oxycodone  HCl 10 MG TABS, Take by mouth., Disp: , Rfl:    polyethylene glycol powder (MIRALAX ) 17 GM/SCOOP powder, Take 17 g by mouth in the morning and at bedtime., Disp: 225 g, Rfl: PRN   tiZANidine  (ZANAFLEX ) 4 MG tablet, Take 1 tablet (4 mg total) by mouth every 8 (eight) hours as needed for muscle spasms., Disp: 30 tablet, Rfl: 1   traZODone  (DESYREL ) 100 MG tablet, TAKE 1.5 TABLETS (150 MG TOTAL) BY MOUTH AT BEDTIME AS NEEDED. FOR SLEEP, Disp: 135 tablet, Rfl: 3 Social History   Socioeconomic History   Marital status: Married    Spouse name: Ozell   Number of children: 3   Years of education: 14   Highest education level: Some college, no degree  Occupational History   Occupation: Sports coach: UNEMPLOYED  Tobacco Use   Smoking status: Former    Current packs/day: 0.00    Average packs/day: 1 pack/day for 10.0 years (10.0 ttl pk-yrs)    Types: Cigarettes    Quit date: 07/30/2023    Years since quitting: 0.7   Smokeless tobacco: Never   Tobacco comments:    Started 2007  Vaping Use   Vaping status: Former   Quit date: 09/07/2016   Devices: only used for about a  month  Substance and Sexual Activity   Alcohol use: No   Drug use: Yes    Types: Marijuana   Sexual activity: Not Currently  Other Topics Concern   Not on file  Social History Narrative   She lives with her boyfriend.  She used to smoke, quit May 2013, she is  not drinking. She has 3 children   Social Drivers of Corporate investment banker Strain: High Risk (05/01/2024)   Overall Financial Resource Strain (CARDIA)    Difficulty of Paying Living Expenses: Very hard  Food Insecurity: Food Insecurity Present (05/01/2024)   Hunger Vital Sign    Worried About Running Out of Food in the Last Year: Often true    Ran Out of Food in the Last Year: Often true  Transportation Needs: No Transportation Needs (05/01/2024)   PRAPARE - Administrator, Civil Service (Medical): No    Lack of Transportation (Non-Medical): No  Physical Activity: Insufficiently Active (05/01/2024)   Exercise Vital Sign    Days of Exercise per Week: 3 days    Minutes of Exercise per Session: 20 min  Stress: Stress Concern Present (05/01/2024)   [employer] of Occupational Health - Occupational Stress Questionnaire    Feeling of Stress: To some extent  Social Connections: Socially Integrated (05/01/2024)   Social Connection and Isolation Panel    Frequency of Communication with Friends and Family: More than three times a week    Frequency of Social Gatherings with Friends and Family: Three times a week    Attends Religious Services: 1 to 4 times per year    Active Member of Clubs or Organizations: Yes    Attends Banker Meetings: 1 to 4 times per year    Marital Status: Married  Catering manager Violence: Not At Risk (12/30/2022)   Humiliation, Afraid, Rape, and Kick questionnaire    Fear of Current or Ex-Partner: No    Emotionally Abused: No    Physically Abused: No    Sexually Abused: No   Family History  Problem Relation Age of Onset   Hypertension Mother    Heart disease Mother     Anxiety disorder Mother    Depression Mother    Lung cancer Father    Cancer Father        lung   Alcohol abuse Father    Diabetes Sister    Bipolar disorder Sister    Anxiety disorder Sister    Heart disease Sister        Congestive Heart Failure   Hypertension Sister    Depression Sister    Arthritis Paternal Uncle    Diabetes Maternal Grandmother    Heart disease Maternal Grandmother    Colon cancer Paternal Grandmother    Cancer Paternal Grandmother        colon   Esophageal cancer Neg Hx    Rectal cancer Neg Hx    Stomach cancer Neg Hx    Breast cancer Neg Hx     Objective: Office vital signs reviewed. BP 132/81   Pulse 84   Temp 98.6 F (37 C)   Ht 5' 4 (1.626 m)   Wt 203 lb 3.2 oz (92.2 kg)   SpO2 93%   BMI 34.88 kg/m   Physical Examination:  General: Awake, alert, obese female, No acute distress HEENT: Sclera white.  Moist mucous membranes Cardio: regular rate and rhythm, S1S2 heard, no murmurs appreciated Pulm: clear to auscultation bilaterally, no wheezes, rhonchi or rales; normal work of breathing on room air     05/02/2024    1:01 PM 01/14/2024    2:14 PM 07/05/2023    9:43 AM  Depression screen PHQ 2/9  Decreased Interest 0 0 0  Down, Depressed, Hopeless 0 0 0  PHQ - 2 Score 0 0 0  Altered  sleeping 0 0   Tired, decreased energy 0 0   Change in appetite 0 0   Feeling bad or failure about yourself  0 0   Trouble concentrating 0 0   Moving slowly or fidgety/restless 0 0   Suicidal thoughts 0 0   PHQ-9 Score 0 0   Difficult doing work/chores Not difficult at all Not difficult at all       05/02/2024    1:01 PM 01/14/2024    2:14 PM 07/05/2023    9:43 AM 05/05/2023   12:04 PM  GAD 7 : Generalized Anxiety Score  Nervous, Anxious, on Edge 0 0 0 3  Control/stop worrying 0 0 0 3  Worry too much - different things 0 0 0 3  Trouble relaxing 0 0 0 2  Restless 0 0 0 2  Easily annoyed or irritable 0 0 0 2  Afraid - awful might happen 0 0 0 2  Total  GAD 7 Score 0 0 0 17  Anxiety Difficulty Not difficult at all Not difficult at all Not difficult at all Somewhat difficult    Assessment/ Plan: 63 y.o. female   Essential hypertension - Plan: atenolol  (TENORMIN ) 50 MG tablet, lisinopril  (ZESTRIL ) 10 MG tablet, hydrALAZINE  (APRESOLINE ) 50 MG tablet, Ambulatory referral to Sleep Studies  Snoring - Plan: Ambulatory referral to Sleep Studies  Obesity (BMI 30-39.9) - Plan: Ambulatory referral to Sleep Studies  Prediabetes - Plan: CMP14+EGFR, Bayer DCA Hb A1c Waived, Ambulatory referral to Sleep Studies  Former smoker - Plan: Pneumococcal conjugate vaccine 20-valent (Prevnar 20)  Bronchitis - Plan: albuterol  (VENTOLIN  HFA) 108 (90 Base) MCG/ACT inhaler, Pneumococcal conjugate vaccine 20-valent (Prevnar 20)  GAD (generalized anxiety disorder) - Plan: busPIRone  (BUSPAR ) 15 MG tablet, busPIRone  (BUSPAR ) 5 MG tablet, citalopram  (CELEXA ) 40 MG tablet  Moderate episode of recurrent major depressive disorder (HCC) - Plan: citalopram  (CELEXA ) 40 MG tablet  Chronic constipation - Plan: linaclotide  (LINZESS ) 290 MCG CAPS capsule  Blood pressure controlled.  Meds renewed.  Referral for sleep study given body habitus, hypertension, observed snoring.  Will consider Zepbound if she has moderate to severe sleep apnea  Check A1c given prediabetes, check renal function and liver enzymes  Medications have been renewed.  Did not discuss mood disorders today but she seems to be doing well  Pneumococcal vaccination administered   Norene CHRISTELLA Fielding, DO [facility] [phone]

## 2024-05-02 NOTE — Telephone Encounter (Signed)
 If pt calls back please offer her the 1:00 apt slot

## 2024-05-03 ENCOUNTER — Ambulatory Visit: Payer: Self-pay | Admitting: Family Medicine

## 2024-05-03 ENCOUNTER — Other Ambulatory Visit: Payer: Self-pay | Admitting: Family Medicine

## 2024-05-03 DIAGNOSIS — E782 Mixed hyperlipidemia: Secondary | ICD-10-CM

## 2024-05-03 LAB — CMP14+EGFR
ALT: 34 IU/L — ABNORMAL HIGH (ref 0–32)
AST: 35 IU/L (ref 0–40)
Albumin: 4.1 g/dL (ref 3.9–4.9)
Alkaline Phosphatase: 118 IU/L (ref 44–121)
BUN/Creatinine Ratio: 14 (ref 12–28)
BUN: 13 mg/dL (ref 8–27)
Bilirubin Total: 0.2 mg/dL (ref 0.0–1.2)
CO2: 24 mmol/L (ref 20–29)
Calcium: 9.8 mg/dL (ref 8.7–10.3)
Chloride: 98 mmol/L (ref 96–106)
Creatinine, Ser: 0.91 mg/dL (ref 0.57–1.00)
Globulin, Total: 3 g/dL (ref 1.5–4.5)
Glucose: 137 mg/dL — ABNORMAL HIGH (ref 70–99)
Potassium: 4.2 mmol/L (ref 3.5–5.2)
Sodium: 140 mmol/L (ref 134–144)
Total Protein: 7.1 g/dL (ref 6.0–8.5)
eGFR: 70 mL/min/{1.73_m2} (ref >59)

## 2024-05-03 MED ORDER — NEXLETOL 180 MG PO TABS: 1.0000 | ORAL_TABLET | Freq: Every day | ORAL | 3 refills | Status: AC

## 2024-05-03 NOTE — Progress Notes (Signed)
 Meds ordered this encounter  Medications   Bempedoic Acid  (NEXLETOL ) 180 MG TABS    Sig: Take 1 tablet (180 mg total) by mouth daily.    Dispense:  90 tablet    Refill:  3

## 2024-05-08 DIAGNOSIS — G894 Chronic pain syndrome: Secondary | ICD-10-CM | POA: Diagnosis not present

## 2024-05-08 DIAGNOSIS — Z981 Arthrodesis status: Secondary | ICD-10-CM | POA: Diagnosis not present

## 2024-05-08 DIAGNOSIS — M545 Low back pain, unspecified: Secondary | ICD-10-CM | POA: Diagnosis not present

## 2024-05-08 DIAGNOSIS — M542 Cervicalgia: Secondary | ICD-10-CM | POA: Diagnosis not present

## 2024-05-08 DIAGNOSIS — G8929 Other chronic pain: Secondary | ICD-10-CM | POA: Diagnosis not present

## 2024-06-05 DIAGNOSIS — M545 Low back pain, unspecified: Secondary | ICD-10-CM | POA: Diagnosis not present

## 2024-06-05 DIAGNOSIS — G8929 Other chronic pain: Secondary | ICD-10-CM | POA: Diagnosis not present

## 2024-06-05 DIAGNOSIS — G894 Chronic pain syndrome: Secondary | ICD-10-CM | POA: Diagnosis not present

## 2024-06-05 DIAGNOSIS — M47816 Spondylosis without myelopathy or radiculopathy, lumbar region: Secondary | ICD-10-CM | POA: Diagnosis not present

## 2024-06-06 ENCOUNTER — Other Ambulatory Visit: Payer: Self-pay | Admitting: Family Medicine

## 2024-06-06 DIAGNOSIS — F411 Generalized anxiety disorder: Secondary | ICD-10-CM

## 2024-06-13 ENCOUNTER — Ambulatory Visit: Admitting: Family Medicine

## 2024-06-21 ENCOUNTER — Ambulatory Visit

## 2024-06-21 VITALS — BP 132/81 | HR 84 | Ht 64.0 in | Wt 203.0 lb

## 2024-06-21 DIAGNOSIS — Z0000 Encounter for general adult medical examination without abnormal findings: Secondary | ICD-10-CM | POA: Diagnosis not present

## 2024-06-21 DIAGNOSIS — Z13820 Encounter for screening for osteoporosis: Secondary | ICD-10-CM

## 2024-06-21 DIAGNOSIS — Z139 Encounter for screening, unspecified: Secondary | ICD-10-CM

## 2024-06-21 DIAGNOSIS — Z1231 Encounter for screening mammogram for malignant neoplasm of breast: Secondary | ICD-10-CM

## 2024-06-21 NOTE — Patient Instructions (Signed)
 [name] , Thank you for taking time out of your busy schedule to complete your Annual Wellness Visit with me. I enjoyed our conversation and look forward to speaking with you again next year. I, as well as your care team,  appreciate your ongoing commitment to your health goals. Please review the following plan we discussed and let me know if I can assist you in the future. Your Game plan/ To Do List    Referrals: If you haven't heard from the office you've been referred to, please reach out to them at the phone provided.   Follow up Visits: We will see or speak with you next year for your Next Medicare AWV with our clinical staff on 06/25/25 at 12:30p.m. Have you seen your provider in the last 6 months (3 months if uncontrolled diabetes)? Yes  Clinician Recommendations:  Aim for 30 minutes of exercise or brisk walking, 6-8 glasses of water, and 5 servings of fruits and vegetables each day.       This is a list of the screenings recommended for you:  Health Maintenance  Topic Date Due   Medicare Annual Wellness Visit  12/31/2023   COVID-19 Vaccine (11 - Mixed Product risk 2024-25 season) 03/06/2024   DEXA scan (bone density measurement)  05/08/2024   Mammogram  06/01/2024   Flu Shot  06/09/2024   DTaP/Tdap/Td vaccine (2 - Td or Tdap) 02/05/2028   Colon Cancer Screening  03/20/2034   Pneumococcal Vaccine for age over 76  Completed   Hepatitis C Screening  Completed   HIV Screening  Completed   Zoster (Shingles) Vaccine  Completed   Hepatitis B Vaccine  Aged Out   HPV Vaccine  Aged Out   Meningitis B Vaccine  Aged Out    Advanced directives: (Declined) Advance directive discussed with you today. Even though you declined this today, please call our office should you change your mind, and we can give you the proper paperwork for you to fill out. Advance Care Planning is important because it:  [x]  Makes sure you receive the medical care that is consistent with your values, goals,  and preferences  [x]  It provides guidance to your family and loved ones and reduces their decisional burden about whether or not they are making the right decisions based on your wishes.  Follow the link provided in your after visit summary or read over the paperwork we have mailed to you to help you started getting your Advance Directives in place. If you need assistance in completing these, please reach out to us  so that we can help you!  See attachments for Preventive Care and Fall Prevention Tips.

## 2024-06-21 NOTE — Progress Notes (Signed)
 Subjective:   [name] is a 63 y.o. who presents for a Medicare Wellness preventive visit.  As a reminder, Annual Wellness Visits don't include a physical exam, and some assessments may be limited, especially if this visit is performed virtually. We may recommend an in-person follow-up visit with your provider if needed.  Visit Complete: Virtual I connected with  [name] on 06/21/24 by a audio enabled telemedicine application and verified that I am speaking with the correct person using two identifiers.  Patient Location: Home  Provider Location: Home Office  I discussed the limitations of evaluation and management by telemedicine. The patient expressed understanding and agreed to proceed.  Vital Signs: Because this visit was a virtual/telehealth visit, some criteria may be missing or patient reported. Any vitals not documented were not able to be obtained and vitals that have been documented are patient reported.  VideoDeclined- This patient declined Librarian, academic. Therefore the visit was completed with audio only.  Persons Participating in Visit: Patient.  AWV Questionnaire: No: Patient Medicare AWV questionnaire was not completed prior to this visit.  Cardiac Risk Factors include: advanced age (>97men, >77 women);obesity (BMI >30kg/m2);smoking/ tobacco exposure;dyslipidemia;hypertension     Objective:    Today's Vitals   06/21/24 1426  BP: 132/81  Pulse: 84  Weight: 203 lb (92.1 kg)  Height: 5' 4 (1.626 m)   Body mass index is 34.84 kg/m.     12/07/2023    6:30 AM 12/30/2022    1:44 PM 10/27/2022    1:32 PM 07/07/2022    3:28 PM 11/20/2021    4:03 PM 10/29/2021    5:00 PM 10/27/2021   11:08 AM  Advanced Directives  Does Patient Have a Medical Advance Directive? No No No No No No No  Would patient like information on creating a medical advance directive? No - Patient declined No - Patient declined No - Patient  declined  No - Patient declined No - Patient declined No - Patient declined    Current Medications (verified) Outpatient Encounter Medications as of 06/21/2024  Medication Sig   albuterol  (VENTOLIN  HFA) 108 (90 Base) MCG/ACT inhaler Inhale 2 puffs into the lungs every 6 (six) hours as needed for wheezing or shortness of breath.   amitriptyline  (ELAVIL ) 25 MG tablet Take 25-50 mg by mouth See admin instructions. Take 50 mg by mouth at bedtime, may take 25-50 mg during the day if needed for pain   atenolol  (TENORMIN ) 50 MG tablet Take 1 tablet (50 mg total) by mouth 2 (two) times daily.   Bempedoic Acid  (NEXLETOL ) 180 MG TABS Take 1 tablet (180 mg total) by mouth daily.   busPIRone  (BUSPAR ) 15 MG tablet Take 1 tablet (15 mg total) by mouth 3 (three) times daily. With 5mg  to equal 20mg    busPIRone  (BUSPAR ) 5 MG tablet TAKE 1 TABLET BY MOUTH 3 TIMES DAILY. TAKE 1 TABLET WITH THE 15MG  TO EQUAL 20MG    cetirizine  (ZYRTEC ) 10 MG tablet Take 1 tablet (10 mg total) by mouth daily.   citalopram  (CELEXA ) 40 MG tablet TAKE 1 TABLET BY MOUTH DAILY FOR MOOD   famotidine  (PEPCID ) 20 MG tablet Take 1 tablet (20 mg total) by mouth daily as needed for heartburn or indigestion.   fluticasone  (FLONASE ) 50 MCG/ACT nasal spray SPRAY 2 SPRAYS INTO EACH NOSTRIL EVERY DAY   hydrALAZINE  (APRESOLINE ) 50 MG tablet Take 1 tablet (50 mg total) by mouth in the morning and at bedtime.   latanoprost  (XALATAN )  0.005 % ophthalmic solution Place 1 drop into both eyes at bedtime.   linaclotide  (LINZESS ) 290 MCG CAPS capsule Take 1 capsule (290 mcg total) by mouth daily before breakfast.   lisinopril  (ZESTRIL ) 10 MG tablet Take 0.5-1 tablets (5-10 mg total) by mouth daily.   naloxone (NARCAN) nasal spray 4 mg/0.1 mL SMARTSIG:Both Nares   nitroGLYCERIN  (NITROSTAT ) 0.4 MG SL tablet PLACE 1 TABLET UNDER THE TONGUE EVERY 5 MINUTES AS NEEDED FOR CHEST PAIN.   ondansetron  (ZOFRAN ) 4 MG tablet Take 1 tablet (4 mg total) by mouth every 8  (eight) hours as needed for nausea or vomiting.   Oxycodone  HCl 10 MG TABS Take by mouth.   polyethylene glycol powder (MIRALAX ) 17 GM/SCOOP powder Take 17 g by mouth in the morning and at bedtime.   tiZANidine  (ZANAFLEX ) 4 MG tablet Take 1 tablet (4 mg total) by mouth every 8 (eight) hours as needed for muscle spasms.   traZODone  (DESYREL ) 100 MG tablet TAKE 1.5 TABLETS (150 MG TOTAL) BY MOUTH AT BEDTIME AS NEEDED. FOR SLEEP   No facility-administered encounter medications on file as of 06/21/2024.    Allergies (verified) Doxycycline , Nsaids, and Tolmetin   History: Past Medical History:  Diagnosis Date   Abdominal pain    Abscess, intra-abdominal, postoperative (HCC) 12/31/2020   Acute blood loss anemia (ABLA) 05/04/2021   Acute ITP (HCC) 05/08/2021   Allergy    seasonal   Anemia 2021   Anxiety    Arthritis    Aspiration pneumonia of both lower lobes due to gastric secretions (HCC) 12/22/2020   Blood in stool    Blood transfusion without reported diagnosis 12/21/2020   Breast changes, fibrocystic    Bruises easily    Cardiomegaly 2018   Clotting disorder (HCC)     itp , none since 1976, no current hematologist   Colon perforation due to stercoral ulcer with E. coli fecal peritonitis    Constipation    DDD (degenerative disc disease), cervical    with lumbar issues   Depression    E. coli sepsis -due to fecal peritonitis 12/23/2020   Generalized headaches    GERD (gastroesophageal reflux disease)    Glaucoma    Hemorrhoid    Hyperlipidemia    Hypertension    Idiopathic thrombocytopenia (HCC)    as child   Leg swelling    both ankles   Liver lesion    Nasal congestion    Nausea & vomiting    Neuromuscular disorder (HCC)    back  injury   Neuromuscular disorder (HCC)    3 neck surgeries,neck fusion.pin,plates,screws   Osteoporosis    Panic attacks    pt also  has germaphobia   Rectal bleeding    Seizures (HCC) 1998   stress induced, one time, none since, no  seizure meds   Seizures (HCC)    Stated she had 2 weeks ago,it was like a transient,stare.04/01/21 pt. denies   Spleen enlarged 1976, none since   r/t idiopathic thrombocytopenia   Stercoral ulcer of large intestine 12/21/2020   Trouble swallowing    Unintentional weight loss    Weakness    weakness varies   Past Surgical History:  Procedure Laterality Date   ABDOMINAL HYSTERECTOMY  1997   BACK SURGERY     CERVICAL SPINE SURGERY  2005, 2007, 2010   x3, fusion done,plates and screws present   CESAREAN SECTION  1988, 1994   CHOLECYSTECTOMY  03/14/2012   Procedure: LAPAROSCOPIC CHOLECYSTECTOMY;  Surgeon: Jina  Aron, MD;  Location: [facility];  Service: General;  Laterality: N/A;   COLECTOMY WITH COLOSTOMY CREATION/HARTMANN PROCEDURE N/A 12/21/2020   Procedure: COLECTOMY WITH COLOSTOMY CREATION/HARTMANN PROCEDURE;  Surgeon: Kallie Manuelita BROCKS, MD;  Location: [facility];  Service: General;  Laterality: N/A;   COLON SURGERY     colonscopy and esophagogastrodudononescopy  02/04/2012   COLOSTOMY REVERSAL N/A 04/28/2021   Procedure: COLOSTOMY REVERSAL;  Surgeon: Kallie Manuelita BROCKS, MD;  Location: [facility];  Service: General;  Laterality: N/A;   EYE SURGERY  1999   HERNIA REPAIR  2022   INCISIONAL HERNIA REPAIR N/A 10/29/2021   Procedure: HERNIA REPAIR INCISIONAL; OPEN; WITH MESH;  Surgeon: Kallie Manuelita BROCKS, MD;  Location: [facility];  Service: General;  Laterality: N/A;   IR RADIOLOGIST EVAL & MGMT  01/15/2021   LUMBAR DISC SURGERY  11/2014   SPINE SURGERY  2005, 2007, etc   6 surgeries   Family History  Problem Relation Age of Onset   Hypertension Mother    Heart disease Mother    Anxiety disorder Mother    Depression Mother    Varicose Veins Mother    Lung cancer Father    Cancer Father        lung   Alcohol abuse Father    Diabetes Sister    Bipolar disorder Sister    Anxiety disorder Sister    Heart disease Sister        Congestive Heart Failure   Hypertension Sister    Depression  Sister    Arthritis Paternal Uncle    Diabetes Maternal Grandmother    Heart disease Maternal Grandmother    Colon cancer Paternal Grandmother    Cancer Paternal Grandmother        colon   Esophageal cancer Neg Hx    Rectal cancer Neg Hx    Stomach cancer Neg Hx    Breast cancer Neg Hx    Social History   Socioeconomic History   Marital status: Married    Spouse name: Ozell   Number of children: 3   Years of education: 14   Highest education level: Some college, no degree  Occupational History   Occupation: Sports coach: UNEMPLOYED  Tobacco Use   Smoking status: Former    Current packs/day: 0.00    Average packs/day: 1 pack/day for 10.0 years (10.0 ttl pk-yrs)    Types: Cigarettes    Quit date: 07/30/2023    Years since quitting: 0.8   Smokeless tobacco: Never   Tobacco comments:    Started 2007  Vaping Use   Vaping status: Former   Quit date: 09/07/2016   Devices: only used for about a month  Substance and Sexual Activity   Alcohol use: No   Drug use: Yes    Types: Marijuana   Sexual activity: Not Currently  Other Topics Concern   Not on file  Social History Narrative   She lives with her boyfriend.  She used to smoke, quit May 2013, she is not drinking. She has 3 children   Social Drivers of Corporate investment banker Strain: High Risk (06/21/2024)   Overall Financial Resource Strain (CARDIA)    Difficulty of Paying Living Expenses: Hard  Food Insecurity: Food Insecurity Present (06/21/2024)   Hunger Vital Sign    Worried About Running Out of Food in the Last Year: Often true    Ran Out of Food in the Last Year: Often true  Transportation Needs: No Transportation Needs (  06/21/2024)   PRAPARE - Administrator, Civil Service (Medical): No    Lack of Transportation (Non-Medical): No  Physical Activity: Insufficiently Active (06/21/2024)   Exercise Vital Sign    Days of Exercise per Week: 3 days    Minutes of Exercise per Session: 20 min   Stress: No Stress Concern Present (06/21/2024)   [employer] of Occupational Health - Occupational Stress Questionnaire    Feeling of Stress: Only a little  Recent Concern: Stress - Stress Concern Present (05/01/2024)   [employer] of Occupational Health - Occupational Stress Questionnaire    Feeling of Stress: To some extent  Social Connections: Moderately Integrated (06/21/2024)   Social Connection and Isolation Panel    Frequency of Communication with Friends and Family: More than three times a week    Frequency of Social Gatherings with Friends and Family: More than three times a week    Attends Religious Services: More than 4 times per year    Active Member of [employer] or Organizations: No    Attends Banker Meetings: Never    Marital Status: Married    Tobacco Counseling Counseling given: Yes Tobacco comments: Started 2007    Clinical Intake:  Pre-visit preparation completed: Yes  Pain : No/denies pain     Nutritional Risks: None Diabetes: No  Lab Results  Component Value Date   HGBA1C 5.7 (H) 05/02/2024   HGBA1C 5.7 (H) 01/14/2024   HGBA1C 5.8 (H) 09/08/2021     How often do you need to have someone help you when you read instructions, pamphlets, or other written materials from your doctor or pharmacy?: 1 - Never  Interpreter Needed?: No  Information entered by :: alia t/cma   Activities of Daily Living     06/21/2024    2:26 PM  In your present state of health, do you have any difficulty performing the following activities:  Hearing? 0  Vision? 0  Difficulty concentrating or making decisions? 1  Walking or climbing stairs? 0  Dressing or bathing? 0  Doing errands, shopping? 0  Preparing Food and eating ? N  Using the Toilet? N  In the past six months, have you accidently leaked urine? Y  Do you have problems with loss of bowel control? N  Managing your Medications? N  Managing your Finances? N  Housekeeping or managing your  Housekeeping? N    Patient Care Team: Jolinda Norene HERO, DO as PCP - General (Family Medicine) Alvan Dorn FALCON, MD as PCP - Cardiology (Cardiology) Gust Royden ORN, MD (Orthopedic Surgery) Alvan Dorn FALCON, MD as Consulting Physician (Cardiology) Billee Mliss BIRCH, [facility] as [facility] (Pharmacist) [facility], [facility], [place] RAMAN, DPM as Consulting Physician (Podiatry) Teressa Toribio SQUIBB, MD (Inactive) as Attending Physician (Gastroenterology)  I have updated your Care Teams any recent Medical Services you may have received from other providers in the past year.     Assessment:   This is a routine wellness examination for Lea.  Hearing/Vision screen Hearing Screening - Comments:: Pt denies hearing dif Vision Screening - Comments:: Pt denies vision dif/pt's last 08/2023   Goals Addressed             This Visit's Progress    DIET - REDUCE SUGAR INTAKE   On track    Patient states she would like to 20 lbs.        Depression Screen     06/21/2024    2:38 PM  05/02/2024    1:01 PM 01/14/2024    2:14 PM 07/05/2023    9:43 AM 05/05/2023   12:03 PM 12/30/2022    1:41 PM 10/27/2022   12:39 PM  PHQ 2/9 Scores  PHQ - 2 Score 1 0 0 0 4 0 0  PHQ- 9 Score 2 0 0  9      Fall Risk     06/21/2024    2:24 PM 05/02/2024    1:01 PM 01/14/2024    2:27 PM 07/05/2023    8:07 AM 05/05/2023   12:03 PM  Fall Risk   Falls in the past year? 0 0 0 0 0  Number falls in past yr: 0 0 0 0   Injury with Fall? 0 0 0 0   Risk for fall due to : No Fall Risks No Fall Risks No Fall Risks No Fall Risks   Follow up Falls evaluation completed Falls evaluation completed Falls evaluation completed Education provided Falls evaluation completed    MEDICARE RISK AT HOME:  Medicare Risk at Home Any stairs in or around the home?: Yes If so, are there any without handrails?: Yes Home free of loose throw rugs in walkways, pet beds, electrical cords, etc?: Yes Adequate  lighting in your home to reduce risk of falls?: Yes Life alert?: No Use of a cane, walker or w/c?: No Grab bars in the bathroom?: No Shower chair or bench in shower?: No Elevated toilet seat or a handicapped toilet?: No  TIMED UP AND GO:  Was the test performed?  no  Cognitive Function: 6CIT completed    09/06/2018    2:58 PM  MMSE - Mini Mental State Exam  Orientation to time 5  Orientation to Place 5  Registration 3  Attention/ Calculation 5  Recall 3  Language- name 2 objects 2  Language- repeat 1  Language- follow 3 step command 3  Language- read & follow direction 1  Write a sentence 1  Copy design 1  Total score 30        06/21/2024    2:39 PM 12/30/2022    1:45 PM 09/18/2020    1:32 PM 09/08/2019    2:55 PM  6CIT Screen  What Year? 0 points 0 points 0 points 0 points  What month? 0 points 0 points 0 points 0 points  What time? 0 points 0 points 0 points 0 points  Count back from 20 0 points 0 points 0 points 0 points  Months in reverse 0 points 0 points 0 points 0 points  Repeat phrase 0 points 0 points 0 points 2 points  Total Score 0 points 0 points 0 points 2 points    Immunizations Immunization History  Administered Date(s) Administered   Influenza Inj Mdck Quad Pf 12/08/2022   Influenza,inj,Quad PF,6+ Mos 10/06/2016, 08/05/2017, 08/08/2018, 09/06/2020   Influenza-Unspecified 05/28/2021, 12/08/2022   Moderna Covid-19 Fall Seasonal Vaccine 84yrs & older 09/06/2023   Moderna Sars-Covid-2 Vaccination 02/19/2020, 03/18/2020, 10/08/2020, 05/28/2021   PFIZER Comirnaty(Gray Top)Covid-19 Tri-Sucrose Vaccine 05/28/2021   PFIZER(Purple Top)SARS-COV-2 Vaccination 02/19/2020, 03/18/2020, 10/08/2020   PNEUMOCOCCAL CONJUGATE-20 05/02/2024   Pfizer Covid-19 Vaccine Bivalent Booster 18yrs & up 09/06/2021   Pfizer(Comirnaty)Fall Seasonal Vaccine 12 years and older 12/08/2022   Pneumococcal Conjugate-13 10/06/2016   Tdap 02/04/2018   Unspecified SARS-COV-2  Vaccination 12/08/2022   Zoster Recombinant(Shingrix ) 09/06/2018, 05/02/2024    Screening Tests Health Maintenance  Topic Date Due   COVID-19 Vaccine (11 - Mixed Product risk 2024-25 season) 03/06/2024  DEXA SCAN  05/08/2024   MAMMOGRAM  06/01/2024   INFLUENZA VACCINE  06/09/2024   Medicare Annual Wellness (AWV)  06/21/2025   DTaP/Tdap/Td (2 - Td or Tdap) 02/05/2028   Colonoscopy  03/20/2034   Pneumococcal Vaccine: 50+ Years  Completed   Hepatitis C Screening  Completed   HIV Screening  Completed   Zoster Vaccines- Shingrix   Completed   Hepatitis B Vaccines  Aged Out   HPV VACCINES  Aged Out   Meningococcal B Vaccine  Aged Out    Health Maintenance  Health Maintenance Due  Topic Date Due   COVID-19 Vaccine (11 - Mixed Product risk 2024-25 season) 03/06/2024   DEXA SCAN  05/08/2024   MAMMOGRAM  06/01/2024   INFLUENZA VACCINE  06/09/2024   Health Maintenance Items Addressed: DEXA ordered and mammogram ordered  Additional Screening:  Vision Screening: Recommended annual ophthalmology exams for early detection of glaucoma and other disorders of the eye. Would you like a referral to an eye doctor? No    Dental Screening: Recommended annual dental exams for proper oral hygiene  Community Resource Referral / Chronic Care Management: CRR required this visit?  Yes   CCM required this visit?  No   Plan:    I have personally reviewed and noted the following in the patient's chart:   Medical and social history Use of alcohol, tobacco or illicit drugs  Current medications and supplements including opioid prescriptions. Patient is not currently taking opioid prescriptions. Functional ability and status Nutritional status Physical activity Advanced directives List of other physicians Hospitalizations, surgeries, and ER visits in previous 12 months Vitals Screenings to include cognitive, depression, and falls Referrals and appointments  In addition, I have reviewed  and discussed with patient certain preventive protocols, quality metrics, and best practice recommendations. A written personalized care plan for preventive services as well as general preventive health recommendations were provided to patient.   Ozie Ned, CMA   06/21/2024   After Visit Summary: (MyChart) Due to this being a telephonic visit, the after visit summary with patients personalized plan was offered to patient via MyChart   Notes: Nothing significant to report at this time.

## 2024-07-05 ENCOUNTER — Ambulatory Visit
Admission: RE | Admit: 2024-07-05 | Discharge: 2024-07-05 | Disposition: A | Discharge status: Home / Self Care | Source: Ambulatory Visit | Attending: Family Medicine

## 2024-07-05 DIAGNOSIS — Z1231 Encounter for screening mammogram for malignant neoplasm of breast: Secondary | ICD-10-CM | POA: Diagnosis not present

## 2024-07-05 DIAGNOSIS — Z0000 Encounter for general adult medical examination without abnormal findings: Secondary | ICD-10-CM

## 2024-07-13 DIAGNOSIS — G8929 Other chronic pain: Secondary | ICD-10-CM | POA: Diagnosis not present

## 2024-07-13 DIAGNOSIS — M5442 Lumbago with sciatica, left side: Secondary | ICD-10-CM | POA: Diagnosis not present

## 2024-07-13 DIAGNOSIS — M5441 Lumbago with sciatica, right side: Secondary | ICD-10-CM | POA: Diagnosis not present

## 2024-07-13 DIAGNOSIS — G894 Chronic pain syndrome: Secondary | ICD-10-CM | POA: Diagnosis not present

## 2024-07-13 DIAGNOSIS — Z1330 Encounter for screening examination for mental health and behavioral disorders, unspecified: Secondary | ICD-10-CM | POA: Diagnosis not present

## 2024-07-13 DIAGNOSIS — M791 Myalgia, unspecified site: Secondary | ICD-10-CM | POA: Diagnosis not present

## 2024-07-13 DIAGNOSIS — Z981 Arthrodesis status: Secondary | ICD-10-CM | POA: Diagnosis not present

## 2024-08-10 DIAGNOSIS — G8929 Other chronic pain: Secondary | ICD-10-CM | POA: Diagnosis not present

## 2024-08-10 DIAGNOSIS — Z981 Arthrodesis status: Secondary | ICD-10-CM | POA: Diagnosis not present

## 2024-08-10 DIAGNOSIS — M5441 Lumbago with sciatica, right side: Secondary | ICD-10-CM | POA: Diagnosis not present

## 2024-08-10 DIAGNOSIS — M791 Myalgia, unspecified site: Secondary | ICD-10-CM | POA: Diagnosis not present

## 2024-08-10 DIAGNOSIS — G894 Chronic pain syndrome: Secondary | ICD-10-CM | POA: Diagnosis not present

## 2024-08-10 DIAGNOSIS — M5106 Intervertebral disc disorders with myelopathy, lumbar region: Secondary | ICD-10-CM | POA: Diagnosis not present

## 2024-08-10 DIAGNOSIS — M5442 Lumbago with sciatica, left side: Secondary | ICD-10-CM | POA: Diagnosis not present

## 2024-11-20 ENCOUNTER — Encounter: Payer: Self-pay | Admitting: Family Medicine

## 2024-11-20 NOTE — Progress Notes (Unsigned)
 "  [name] is a 64 y.o. female presents to office today for annual physical exam examination.     ***   Occupation: ***, Marital status: ***, Substance use: *** Health Maintenance Due  Topic Date Due   Bone Density Scan  05/08/2024   Influenza Vaccine  06/09/2024    Immunization History  Administered Date(s) Administered   Influenza Inj Mdck Quad Pf 12/08/2022   Influenza,inj,Quad PF,6+ Mos 10/06/2016, 08/05/2017, 08/08/2018, 09/06/2020   Influenza-Unspecified 05/28/2021, 12/08/2022   Moderna Covid-19 Fall Seasonal Vaccine 53yrs & older 09/06/2023   Moderna Sars-Covid-2 Vaccination 02/19/2020, 03/18/2020, 10/08/2020, 05/28/2021   PFIZER Comirnaty(Gray Top)Covid-19 Tri-Sucrose Vaccine 05/28/2021   PFIZER(Purple Top)SARS-COV-2 Vaccination 02/19/2020, 03/18/2020, 10/08/2020   PNEUMOCOCCAL CONJUGATE-20 05/02/2024   Pfizer Covid-19 Vaccine Bivalent Booster 48yrs & up 09/06/2021   Pfizer(Comirnaty)Fall Seasonal Vaccine 12 years and older 12/08/2022   Pneumococcal Conjugate-13 10/06/2016   Tdap 02/04/2018   Unspecified SARS-COV-2 Vaccination 12/08/2022   Zoster Recombinant(Shingrix ) 09/06/2018, 05/02/2024   Past Medical History:  Diagnosis Date   Abdominal pain    Abscess, intra-abdominal, postoperative (HCC) 12/31/2020   Acute blood loss anemia (ABLA) 05/04/2021   Acute ITP (HCC) 05/08/2021   Acute Thrombocytopenia - HIT negative 05/04/2021   Allergy    seasonal   Anemia 2021   Anxiety    Arthritis    Aspiration pneumonia of both lower lobes due to gastric secretions (HCC) 12/22/2020   Benzodiazepine dependence (HCC) 08/08/2018   Blood in stool    Blood transfusion without reported diagnosis 12/21/2020   Breast changes, fibrocystic    Bruises easily    Cardiomegaly 2018   Chest pain 06/30/2017   Clotting disorder     itp , none since 1976, no current hematologist   Colon perforation due to stercoral ulcer with E. coli fecal peritonitis    Constipation     Controlled substance agreement broken 08/08/2018   DDD (degenerative disc disease), cervical    with lumbar issues   DEPENDENT EDEMA, LEGS, BILATERAL 01/25/2007   Qualifier: Diagnosis of   By: Rollene MD, [place]         Depression    E. coli sepsis -due to fecal peritonitis 12/23/2020   Generalized headaches    GERD (gastroesophageal reflux disease)    Glaucoma    Hemorrhoid    Hyperlipidemia    Hypertension    Idiopathic thrombocytopenia (HCC)    as child   Leg swelling    both ankles   Liver lesion    Nasal congestion    Nausea & vomiting    Neuromuscular disorder (HCC)    back  injury   Neuromuscular disorder (HCC)    3 neck surgeries,neck fusion.pin,plates,screws   Osteoporosis    Panic attacks    pt also  has germaphobia   Parastomal hernia without obstruction or gangrene 04/18/2021   Rectal bleeding    Seizures (HCC) 1998   stress induced, one time, none since, no seizure meds   Seizures (HCC)    Stated she had 2 weeks ago,it was like a transient,stare.04/01/21 pt. denies   Spleen enlarged 1976, none since   r/t idiopathic thrombocytopenia   Stercoral ulcer of large intestine 12/21/2020   SYNCOPE 12/20/2007   Qualifier: Diagnosis of   By: Rollene MD, [place]         Trouble swallowing    Unintentional weight loss    Weakness    weakness varies   Social History   Socioeconomic History   Marital status: Married  Spouse name: Ozell   Number of children: 3   Years of education: 14   Highest education level: Some college, no degree  Occupational History   Occupation: Sports Coach: UNEMPLOYED  Tobacco Use   Smoking status: Former    Current packs/day: 0.00    Average packs/day: 1 pack/day for 10.0 years (10.0 ttl pk-yrs)    Types: Cigarettes    Quit date: 07/30/2023    Years since quitting: 1.3   Smokeless tobacco: Never   Tobacco comments:    Started 2007  Vaping Use   Vaping status: Former   Quit date: 09/07/2016   Devices: only used for  about a month  Substance and Sexual Activity   Alcohol use: No   Drug use: Yes    Types: Marijuana   Sexual activity: Not Currently  Other Topics Concern   Not on file  Social History Narrative   She lives with her boyfriend.  She used to smoke, quit May 2013, she is not drinking. She has 3 children   Social Drivers of Health   Tobacco Use: High Risk (11/13/2024)   Received from [facility]   Patient History    Smoking Tobacco Use: Every Day    Smokeless Tobacco Use: Never    Passive Exposure: Not on file  Financial Resource Strain: Medium Risk (07/13/2024)   Received from [facility]   Overall Financial Resource Strain (CARDIA)    How hard is it for you to pay for the very basics like food, housing, medical care, and heating?: Somewhat hard  Food Insecurity: Food Insecurity Present (07/13/2024)   Received from [facility]   Epic    Within the past 12 months, you worried that your food would run out before you got the money to buy more.: Often true    Within the past 12 months, the food you bought just didn't last and you didn't have money to get more.: Sometimes true  Transportation Needs: No Transportation Needs (07/13/2024)   Received from [facility]    In the past 12 months, has lack of transportation kept you from medical appointments or from getting medications?: No    In the past 12 months, has lack of transportation kept you from meetings, work, or from getting things needed for daily living?: No  Physical Activity: Insufficiently Active (06/21/2024)   Exercise Vital Sign    Days of Exercise per Week: 3 days    Minutes of Exercise per Session: 20 min  Stress: No Stress Concern Present (06/21/2024)   [employer] of Occupational [employer] - Occupational Stress Questionnaire    Feeling of Stress: Only a little  Recent Concern: Stress - Stress Concern Present (05/01/2024)   [employer] of Occupational [employer] - Occupational Stress Questionnaire    Feeling of  Stress: To some extent  Social Connections: Moderately Integrated (06/21/2024)   Social Connection and Isolation Panel    Frequency of Communication with Friends and Family: More than three times a week    Frequency of Social Gatherings with Friends and Family: More than three times a week    Attends Religious Services: More than 4 times per year    Active Member of [employer] or Organizations: No    Attends Banker Meetings: Never    Marital Status: Married  Catering Manager Violence: Not At Risk (06/21/2024)   Epic    Fear of Current or Ex-Partner: No    Emotionally Abused: No  Physically Abused: No    Sexually Abused: No  Depression (PHQ2-9): Low Risk (06/21/2024)   Depression (PHQ2-9)    PHQ-2 Score: 2  Alcohol Screen: Low Risk (06/21/2024)   Alcohol Screen    Last Alcohol Screening Score (AUDIT): 0  Housing: Low Risk (07/13/2024)   Received from [facility]    In the last 12 months, was there a time when you were not able to pay the mortgage or rent on time?: No    In the past 12 months, how many times have you moved where you were living?: 0    At any time in the past 12 months, were you homeless or living in a shelter (including now)?: No  Recent Concern: Housing - High Risk (05/01/2024)   Epic    Unable to Pay for Housing in the Last Year: Yes    Number of Times Moved in the Last Year: 0    Homeless in the Last Year: No  Utilities: Not At Risk (07/13/2024)   Received from [facility]    In the past 12 months has the electric, gas, oil, or water company threatened to shut off services in your home?: No  Recent Concern: Utilities - At Risk (06/21/2024)   Epic    Threatened with loss of utilities: Yes  Health Literacy: Adequate Health Literacy (06/21/2024)   B1300 Health Literacy    Frequency of need for help with medical instructions: Never   Past Surgical History:  Procedure Laterality Date   ABDOMINAL HYSTERECTOMY  1997   BACK SURGERY      CERVICAL SPINE SURGERY  2005, 2007, 2010   x3, fusion done,plates and screws present   CESAREAN SECTION  1988, 1994   CHOLECYSTECTOMY  03/14/2012   Procedure: LAPAROSCOPIC CHOLECYSTECTOMY;  Surgeon: Jina Nephew, MD;  Location: [facility];  Service: General;  Laterality: N/A;   COLECTOMY WITH COLOSTOMY CREATION/HARTMANN PROCEDURE N/A 12/21/2020   Procedure: COLECTOMY WITH COLOSTOMY CREATION/HARTMANN PROCEDURE;  Surgeon: Kallie Manuelita BROCKS, MD;  Location: [facility];  Service: General;  Laterality: N/A;   COLON SURGERY     colonscopy and esophagogastrodudononescopy  02/04/2012   COLOSTOMY REVERSAL N/A 04/28/2021   Procedure: COLOSTOMY REVERSAL;  Surgeon: Kallie Manuelita BROCKS, MD;  Location: [facility];  Service: General;  Laterality: N/A;   EYE SURGERY  1999   HERNIA REPAIR  2022   INCISIONAL HERNIA REPAIR N/A 10/29/2021   Procedure: HERNIA REPAIR INCISIONAL; OPEN; WITH MESH;  Surgeon: Kallie Manuelita BROCKS, MD;  Location: [facility];  Service: General;  Laterality: N/A;   IR RADIOLOGIST EVAL & MGMT  01/15/2021   LUMBAR DISC SURGERY  11/2014   SPINE SURGERY  2005, 2007, etc   6 surgeries   Family History  Problem Relation Age of Onset   Hypertension Mother    Heart disease Mother    Anxiety disorder Mother    Depression Mother    Varicose Veins Mother    Lung cancer Father    Cancer Father        lung   Alcohol abuse Father    Diabetes Sister    Bipolar disorder Sister    Anxiety disorder Sister    Heart disease Sister        Congestive Heart Failure   Hypertension Sister    Depression Sister    Arthritis Paternal Uncle    Diabetes Maternal Grandmother    Heart disease Maternal Grandmother    Colon cancer Paternal Grandmother  Cancer Paternal Grandmother        colon   Esophageal cancer Neg Hx    Rectal cancer Neg Hx    Stomach cancer Neg Hx    Breast cancer Neg Hx    Current Medications[1]  Allergies[2]   ROS: Review of Systems {ros; complete:30496}    Physical exam {Exam,  [phone]}      06/21/2024    2:38 PM 05/02/2024    1:01 PM 01/14/2024    2:14 PM  Depression screen PHQ 2/9  Decreased Interest 1 0 0  Down, Depressed, Hopeless 0 0 0  PHQ - 2 Score 1 0 0  Altered sleeping 0 0 0  Tired, decreased energy 1 0 0  Change in appetite 0 0 0  Feeling bad or failure about yourself  0 0 0  Trouble concentrating 0 0 0  Moving slowly or fidgety/restless 0 0 0  Suicidal thoughts 0 0 0  PHQ-9 Score 2  0  0   Difficult doing work/chores Not difficult at all Not difficult at all Not difficult at all     Data saved with a previous flowsheet row definition      05/02/2024    1:01 PM 01/14/2024    2:14 PM 07/05/2023    9:43 AM 05/05/2023   12:04 PM  GAD 7 : Generalized Anxiety Score  Nervous, Anxious, on Edge 0 0 0 3  Control/stop worrying 0 0 0 3  Worry too much - different things 0 0 0 3  Trouble relaxing 0 0 0 2  Restless 0 0 0 2  Easily annoyed or irritable 0 0 0 2  Afraid - awful might happen 0 0 0 2  Total GAD 7 Score 0 0 0 17  Anxiety Difficulty Not difficult at all Not difficult at all Not difficult at all Somewhat difficult    No results found for this or any previous visit (from the past 2160 hours).   Assessment/ Plan: [name] here for annual physical exam.   Annual physical exam  Mixed hyperlipidemia  Essential hypertension  Obesity (BMI 30-39.9)  Prediabetes  Osteopenia, unspecified location  Low vitamin D  level  History of ITP   ***  Counseled on healthy lifestyle choices, including diet (rich in fruits, vegetables and lean meats and low in salt and simple carbohydrates) and exercise (at least 30 minutes of moderate physical activity daily).  Patient to follow up ***  Kashina Mecum M. Lorilynn Lehr, DO        [1]  Current Outpatient Medications:    albuterol  (VENTOLIN  HFA) 108 (90 Base) MCG/ACT inhaler, Inhale 2 puffs into the lungs every 6 (six) hours as needed for wheezing or shortness of breath., Disp: 8  g, Rfl: 1   amitriptyline  (ELAVIL ) 25 MG tablet, Take 25-50 mg by mouth See admin instructions. Take 50 mg by mouth at bedtime, may take 25-50 mg during the day if needed for pain, Disp: , Rfl:    atenolol  (TENORMIN ) 50 MG tablet, Take 1 tablet (50 mg total) by mouth 2 (two) times daily., Disp: 180 tablet, Rfl: 4   Bempedoic Acid  (NEXLETOL ) 180 MG TABS, Take 1 tablet (180 mg total) by mouth daily., Disp: 90 tablet, Rfl: 3   busPIRone  (BUSPAR ) 15 MG tablet, Take 1 tablet (15 mg total) by mouth 3 (three) times daily. With 5mg  to equal 20mg , Disp: 270 tablet, Rfl: 4   busPIRone  (BUSPAR ) 5 MG tablet, TAKE 1 TABLET BY MOUTH 3 TIMES DAILY. TAKE 1  TABLET WITH THE 15MG  TO EQUAL 20MG , Disp: 270 tablet, Rfl: 4   cetirizine  (ZYRTEC ) 10 MG tablet, Take 1 tablet (10 mg total) by mouth daily., Disp: 90 tablet, Rfl: 3   citalopram  (CELEXA ) 40 MG tablet, TAKE 1 TABLET BY MOUTH DAILY FOR MOOD, Disp: 90 tablet, Rfl: 4   famotidine  (PEPCID ) 20 MG tablet, Take 1 tablet (20 mg total) by mouth daily as needed for heartburn or indigestion., Disp: , Rfl:    fluticasone  (FLONASE ) 50 MCG/ACT nasal spray, SPRAY 2 SPRAYS INTO EACH NOSTRIL EVERY DAY, Disp: 48 mL, Rfl: 1   hydrALAZINE  (APRESOLINE ) 50 MG tablet, Take 1 tablet (50 mg total) by mouth in the morning and at bedtime., Disp: 180 tablet, Rfl: 4   latanoprost  (XALATAN ) 0.005 % ophthalmic solution, Place 1 drop into both eyes at bedtime., Disp: , Rfl:    linaclotide  (LINZESS ) 290 MCG CAPS capsule, Take 1 capsule (290 mcg total) by mouth daily before breakfast., Disp: 90 capsule, Rfl: 4   lisinopril  (ZESTRIL ) 10 MG tablet, Take 0.5-1 tablets (5-10 mg total) by mouth daily., Disp: 90 tablet, Rfl: 4   naloxone (NARCAN) nasal spray 4 mg/0.1 mL, SMARTSIG:Both Nares, Disp: , Rfl:    nitroGLYCERIN  (NITROSTAT ) 0.4 MG SL tablet, PLACE 1 TABLET UNDER THE TONGUE EVERY 5 MINUTES AS NEEDED FOR CHEST PAIN., Disp: 25 tablet, Rfl: 2   ondansetron  (ZOFRAN ) 4 MG tablet, Take 1 tablet (4 mg  total) by mouth every 8 (eight) hours as needed for nausea or vomiting., Disp: 20 tablet, Rfl: 0   Oxycodone  HCl 10 MG TABS, Take by mouth., Disp: , Rfl:    polyethylene glycol powder (MIRALAX ) 17 GM/SCOOP powder, Take 17 g by mouth in the morning and at bedtime., Disp: 225 g, Rfl: PRN   tiZANidine  (ZANAFLEX ) 4 MG tablet, Take 1 tablet (4 mg total) by mouth every 8 (eight) hours as needed for muscle spasms., Disp: 30 tablet, Rfl: 1   traZODone  (DESYREL ) 100 MG tablet, TAKE 1.5 TABLETS (150 MG TOTAL) BY MOUTH AT BEDTIME AS NEEDED. FOR SLEEP, Disp: 135 tablet, Rfl: 3 [2]  Allergies Allergen Reactions   Doxycycline      Other Reaction(s): GI Intolerance   Nsaids Nausea Only    Abdominal pain   Tolmetin     Other Reaction(s): GI Intolerance  Abdominal pain   "

## 2024-11-24 ENCOUNTER — Encounter: Payer: Self-pay | Admitting: Family Medicine

## 2024-12-04 ENCOUNTER — Telehealth: Payer: Self-pay | Admitting: Family Medicine

## 2024-12-04 DIAGNOSIS — M5030 Other cervical disc degeneration, unspecified cervical region: Secondary | ICD-10-CM

## 2024-12-04 DIAGNOSIS — M4800 Spinal stenosis, site unspecified: Secondary | ICD-10-CM

## 2024-12-04 DIAGNOSIS — M419 Scoliosis, unspecified: Secondary | ICD-10-CM

## 2024-12-04 NOTE — Telephone Encounter (Signed)
 Please order to process referral. Thank you Copied from CRM #[id number]. Topic: Referral - Request for Referral >> Dec 04, 2024 11:40 AM Myrick T wrote: Patient called state she is needing a referral for her specialist that she has been going to for her back due to insurance change to [facility]. Please send referral for Dr Royden Schneider at [facility], [address], [place], [place] [address]; NPI# and diagnosis code

## 2024-12-06 NOTE — Telephone Encounter (Signed)
 Yes of course

## 2024-12-06 NOTE — Telephone Encounter (Signed)
Referral placed and pt is aware. 

## 2025-06-25 ENCOUNTER — Ambulatory Visit: Payer: Self-pay

## 2025-07-24 ENCOUNTER — Encounter: Admitting: Family Medicine
